# Patient Record
Sex: Female | Born: 1955 | Race: White | Hispanic: No | Marital: Married | Smoking: Never smoker
Health system: Southern US, Community
[De-identification: ages and names within clinical notes are randomized; demographics above are authoritative.]

## PROBLEM LIST (undated history)

## (undated) DIAGNOSIS — Z794 Long term (current) use of insulin: Secondary | ICD-10-CM

## (undated) DIAGNOSIS — M3500 Sicca syndrome, unspecified: Secondary | ICD-10-CM

## (undated) DIAGNOSIS — F419 Anxiety disorder, unspecified: Secondary | ICD-10-CM

## (undated) DIAGNOSIS — T7840XA Allergy, unspecified, initial encounter: Secondary | ICD-10-CM

## (undated) DIAGNOSIS — G629 Polyneuropathy, unspecified: Secondary | ICD-10-CM

## (undated) DIAGNOSIS — K219 Gastro-esophageal reflux disease without esophagitis: Secondary | ICD-10-CM

## (undated) DIAGNOSIS — H269 Unspecified cataract: Secondary | ICD-10-CM

## (undated) DIAGNOSIS — H8090 Unspecified otosclerosis, unspecified ear: Secondary | ICD-10-CM

## (undated) DIAGNOSIS — I10 Essential (primary) hypertension: Secondary | ICD-10-CM

## (undated) DIAGNOSIS — D692 Other nonthrombocytopenic purpura: Secondary | ICD-10-CM

## (undated) DIAGNOSIS — G709 Myoneural disorder, unspecified: Secondary | ICD-10-CM

## (undated) DIAGNOSIS — E7800 Pure hypercholesterolemia, unspecified: Secondary | ICD-10-CM

## (undated) DIAGNOSIS — D649 Anemia, unspecified: Secondary | ICD-10-CM

## (undated) DIAGNOSIS — L309 Dermatitis, unspecified: Secondary | ICD-10-CM

## (undated) DIAGNOSIS — F32A Depression, unspecified: Secondary | ICD-10-CM

## (undated) DIAGNOSIS — E119 Type 2 diabetes mellitus without complications: Secondary | ICD-10-CM

## (undated) DIAGNOSIS — R011 Cardiac murmur, unspecified: Secondary | ICD-10-CM

## (undated) DIAGNOSIS — M810 Age-related osteoporosis without current pathological fracture: Secondary | ICD-10-CM

## (undated) DIAGNOSIS — R9431 Abnormal electrocardiogram [ECG] [EKG]: Secondary | ICD-10-CM

## (undated) DIAGNOSIS — M069 Rheumatoid arthritis, unspecified: Secondary | ICD-10-CM

## (undated) DIAGNOSIS — G43909 Migraine, unspecified, not intractable, without status migrainosus: Secondary | ICD-10-CM

## (undated) DIAGNOSIS — Z9889 Other specified postprocedural states: Secondary | ICD-10-CM

## (undated) HISTORY — DX: Other nonthrombocytopenic purpura: D69.2

## (undated) HISTORY — DX: Age-related osteoporosis without current pathological fracture: M81.0

## (undated) HISTORY — DX: Other specified postprocedural states: Z98.890

## (undated) HISTORY — DX: Pure hypercholesterolemia, unspecified: E78.00

## (undated) HISTORY — DX: Polyneuropathy, unspecified: G62.9

## (undated) HISTORY — DX: Long term (current) use of insulin: Z79.4

## (undated) HISTORY — DX: Cardiac murmur, unspecified: R01.1

## (undated) HISTORY — DX: Depression, unspecified: F32.A

## (undated) HISTORY — DX: Allergy, unspecified, initial encounter: T78.40XA

## (undated) HISTORY — DX: Sjogren syndrome, unspecified: M35.00

## (undated) HISTORY — DX: Unspecified cataract: H26.9

## (undated) HISTORY — DX: Dermatitis, unspecified: L30.9

## (undated) HISTORY — DX: Migraine, unspecified, not intractable, without status migrainosus: G43.909

## (undated) HISTORY — PX: EYE SURGERY: SHX253

## (undated) HISTORY — PX: COLONOSCOPY: SHX174

## (undated) HISTORY — DX: Abnormal electrocardiogram (ECG) (EKG): R94.31

## (undated) HISTORY — DX: Rheumatoid arthritis, unspecified: M06.9

## (undated) HISTORY — DX: Gastro-esophageal reflux disease without esophagitis: K21.9

## (undated) HISTORY — DX: Anemia, unspecified: D64.9

## (undated) HISTORY — DX: Myoneural disorder, unspecified: G70.9

## (undated) HISTORY — DX: Unspecified otosclerosis, unspecified ear: H80.90

## (undated) HISTORY — DX: Type 2 diabetes mellitus without complications: E11.9

## (undated) HISTORY — DX: Anxiety disorder, unspecified: F41.9

---
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
## 1993-06-21 HISTORY — PX: BREAST LUMPECTOMY: SHX2

## 1998-01-16 ENCOUNTER — Other Ambulatory Visit: Admission: RE | Admit: 1998-01-16 | Discharge: 1998-01-16 | Payer: Self-pay | Admitting: Otolaryngology

## 1998-06-21 DIAGNOSIS — M069 Rheumatoid arthritis, unspecified: Secondary | ICD-10-CM

## 1998-06-21 HISTORY — PX: CARPAL TUNNEL RELEASE: SHX101

## 1998-06-21 HISTORY — DX: Rheumatoid arthritis, unspecified: M06.9

## 1998-06-21 HISTORY — PX: TOE SURGERY: SHX1073

## 1998-08-28 ENCOUNTER — Other Ambulatory Visit: Admission: RE | Admit: 1998-08-28 | Discharge: 1998-08-28 | Payer: Self-pay | Admitting: Obstetrics and Gynecology

## 1998-09-29 ENCOUNTER — Other Ambulatory Visit: Admission: RE | Admit: 1998-09-29 | Discharge: 1998-09-29 | Payer: Self-pay | Admitting: Radiology

## 1998-10-15 ENCOUNTER — Ambulatory Visit (HOSPITAL_COMMUNITY): Admission: RE | Admit: 1998-10-15 | Discharge: 1998-10-15 | Payer: Self-pay | Admitting: *Deleted

## 1998-12-10 ENCOUNTER — Encounter: Admission: RE | Admit: 1998-12-10 | Discharge: 1999-03-10 | Payer: Self-pay | Admitting: Endocrinology

## 1999-04-22 ENCOUNTER — Encounter: Payer: Self-pay | Admitting: Endocrinology

## 1999-04-22 ENCOUNTER — Ambulatory Visit (HOSPITAL_COMMUNITY): Admission: RE | Admit: 1999-04-22 | Discharge: 1999-04-22 | Payer: Self-pay | Admitting: Endocrinology

## 1999-10-12 ENCOUNTER — Other Ambulatory Visit: Admission: RE | Admit: 1999-10-12 | Discharge: 1999-10-12 | Payer: Self-pay | Admitting: Obstetrics and Gynecology

## 1999-10-24 ENCOUNTER — Emergency Department (HOSPITAL_COMMUNITY): Admission: EM | Admit: 1999-10-24 | Discharge: 1999-10-24 | Payer: Self-pay | Admitting: Internal Medicine

## 1999-10-24 ENCOUNTER — Encounter: Payer: Self-pay | Admitting: Internal Medicine

## 2000-04-05 ENCOUNTER — Other Ambulatory Visit: Admission: RE | Admit: 2000-04-05 | Discharge: 2000-04-05 | Payer: Self-pay | Admitting: Specialist

## 2000-06-03 ENCOUNTER — Other Ambulatory Visit: Admission: RE | Admit: 2000-06-03 | Discharge: 2000-06-03 | Payer: Self-pay | Admitting: Obstetrics and Gynecology

## 2000-06-03 ENCOUNTER — Encounter (INDEPENDENT_AMBULATORY_CARE_PROVIDER_SITE_OTHER): Payer: Self-pay | Admitting: Specialist

## 2000-06-21 HISTORY — PX: LYMPH NODE DISSECTION: SHX5087

## 2000-10-17 ENCOUNTER — Other Ambulatory Visit: Admission: RE | Admit: 2000-10-17 | Discharge: 2000-10-17 | Payer: Self-pay | Admitting: Obstetrics and Gynecology

## 2001-02-19 HISTORY — PX: HYSTEROSCOPY: SHX211

## 2001-03-17 ENCOUNTER — Ambulatory Visit (HOSPITAL_COMMUNITY): Admission: RE | Admit: 2001-03-17 | Discharge: 2001-03-17 | Payer: Self-pay | Admitting: Obstetrics and Gynecology

## 2001-03-17 ENCOUNTER — Encounter (INDEPENDENT_AMBULATORY_CARE_PROVIDER_SITE_OTHER): Payer: Self-pay | Admitting: *Deleted

## 2001-08-21 ENCOUNTER — Encounter: Admission: RE | Admit: 2001-08-21 | Discharge: 2001-08-21 | Payer: Self-pay | Admitting: Endocrinology

## 2001-08-21 ENCOUNTER — Encounter: Payer: Self-pay | Admitting: Endocrinology

## 2001-10-12 ENCOUNTER — Other Ambulatory Visit: Admission: RE | Admit: 2001-10-12 | Discharge: 2001-10-12 | Payer: Self-pay | Admitting: Obstetrics and Gynecology

## 2002-08-01 ENCOUNTER — Encounter: Admission: RE | Admit: 2002-08-01 | Discharge: 2002-10-30 | Payer: Self-pay | Admitting: Endocrinology

## 2002-10-17 ENCOUNTER — Encounter: Payer: Self-pay | Admitting: Cardiology

## 2002-10-30 ENCOUNTER — Other Ambulatory Visit: Admission: RE | Admit: 2002-10-30 | Discharge: 2002-10-30 | Payer: Self-pay | Admitting: Obstetrics and Gynecology

## 2004-02-28 ENCOUNTER — Other Ambulatory Visit: Admission: RE | Admit: 2004-02-28 | Discharge: 2004-02-28 | Payer: Self-pay | Admitting: *Deleted

## 2004-03-04 ENCOUNTER — Encounter: Payer: Self-pay | Admitting: Internal Medicine

## 2005-03-02 ENCOUNTER — Other Ambulatory Visit: Admission: RE | Admit: 2005-03-02 | Discharge: 2005-03-02 | Payer: Self-pay | Admitting: *Deleted

## 2006-03-03 ENCOUNTER — Other Ambulatory Visit: Admission: RE | Admit: 2006-03-03 | Discharge: 2006-03-03 | Payer: Self-pay | Admitting: Obstetrics & Gynecology

## 2006-03-22 ENCOUNTER — Encounter (HOSPITAL_COMMUNITY): Admission: RE | Admit: 2006-03-22 | Discharge: 2006-06-20 | Payer: Self-pay | Admitting: Rheumatology

## 2006-07-14 ENCOUNTER — Encounter (HOSPITAL_COMMUNITY): Admission: RE | Admit: 2006-07-14 | Discharge: 2006-10-12 | Payer: Self-pay | Admitting: Interventional Radiology

## 2006-10-13 ENCOUNTER — Encounter (HOSPITAL_COMMUNITY): Admission: RE | Admit: 2006-10-13 | Discharge: 2007-01-11 | Payer: Self-pay | Admitting: Interventional Radiology

## 2007-02-02 ENCOUNTER — Encounter (HOSPITAL_COMMUNITY): Admission: RE | Admit: 2007-02-02 | Discharge: 2007-05-03 | Payer: Self-pay | Admitting: Interventional Radiology

## 2007-05-15 ENCOUNTER — Other Ambulatory Visit: Admission: RE | Admit: 2007-05-15 | Discharge: 2007-05-15 | Payer: Self-pay | Admitting: Obstetrics and Gynecology

## 2007-06-01 ENCOUNTER — Encounter (HOSPITAL_COMMUNITY): Admission: RE | Admit: 2007-06-01 | Discharge: 2007-07-03 | Payer: Self-pay | Admitting: Interventional Radiology

## 2007-07-06 ENCOUNTER — Encounter (HOSPITAL_COMMUNITY): Admission: RE | Admit: 2007-07-06 | Discharge: 2007-10-04 | Payer: Self-pay | Admitting: Interventional Radiology

## 2007-07-10 LAB — HM DEXA SCAN

## 2007-08-14 ENCOUNTER — Encounter (INDEPENDENT_AMBULATORY_CARE_PROVIDER_SITE_OTHER): Payer: Self-pay | Admitting: Otolaryngology

## 2007-08-14 ENCOUNTER — Ambulatory Visit (HOSPITAL_BASED_OUTPATIENT_CLINIC_OR_DEPARTMENT_OTHER): Admission: RE | Admit: 2007-08-14 | Discharge: 2007-08-15 | Payer: Self-pay | Admitting: Otolaryngology

## 2007-10-05 ENCOUNTER — Encounter (HOSPITAL_COMMUNITY): Admission: RE | Admit: 2007-10-05 | Discharge: 2008-01-03 | Payer: Self-pay | Admitting: Rheumatology

## 2008-01-04 ENCOUNTER — Encounter (HOSPITAL_COMMUNITY): Admission: RE | Admit: 2008-01-04 | Discharge: 2008-04-03 | Payer: Self-pay | Admitting: Rheumatology

## 2008-04-25 ENCOUNTER — Encounter (HOSPITAL_COMMUNITY): Admission: RE | Admit: 2008-04-25 | Discharge: 2008-06-20 | Payer: Self-pay | Admitting: Rheumatology

## 2008-06-24 ENCOUNTER — Encounter: Admission: RE | Admit: 2008-06-24 | Discharge: 2008-09-22 | Payer: Self-pay | Admitting: Rheumatology

## 2008-08-09 ENCOUNTER — Other Ambulatory Visit: Admission: RE | Admit: 2008-08-09 | Discharge: 2008-08-09 | Payer: Self-pay | Admitting: Obstetrics and Gynecology

## 2008-10-10 ENCOUNTER — Encounter (HOSPITAL_COMMUNITY): Admission: RE | Admit: 2008-10-10 | Discharge: 2009-01-08 | Payer: Self-pay | Admitting: Rheumatology

## 2008-11-28 ENCOUNTER — Encounter: Payer: Self-pay | Admitting: *Deleted

## 2009-01-09 ENCOUNTER — Encounter (HOSPITAL_COMMUNITY): Admission: RE | Admit: 2009-01-09 | Discharge: 2009-04-09 | Payer: Self-pay | Admitting: Rheumatology

## 2009-04-11 ENCOUNTER — Encounter (HOSPITAL_COMMUNITY): Admission: RE | Admit: 2009-04-11 | Discharge: 2009-07-10 | Payer: Self-pay | Admitting: Rheumatology

## 2009-06-21 LAB — HM COLONOSCOPY

## 2009-07-24 ENCOUNTER — Encounter (HOSPITAL_COMMUNITY): Admission: RE | Admit: 2009-07-24 | Discharge: 2009-10-22 | Payer: Self-pay | Admitting: Rheumatology

## 2009-08-13 ENCOUNTER — Telehealth: Payer: Self-pay | Admitting: Internal Medicine

## 2009-08-15 ENCOUNTER — Encounter: Payer: Self-pay | Admitting: Internal Medicine

## 2009-08-15 ENCOUNTER — Ambulatory Visit: Payer: Self-pay | Admitting: Internal Medicine

## 2009-08-15 DIAGNOSIS — K5901 Slow transit constipation: Secondary | ICD-10-CM | POA: Insufficient documentation

## 2009-08-15 DIAGNOSIS — R143 Flatulence: Secondary | ICD-10-CM

## 2009-08-15 DIAGNOSIS — R142 Eructation: Secondary | ICD-10-CM

## 2009-08-15 DIAGNOSIS — K259 Gastric ulcer, unspecified as acute or chronic, without hemorrhage or perforation: Secondary | ICD-10-CM | POA: Insufficient documentation

## 2009-08-15 DIAGNOSIS — F411 Generalized anxiety disorder: Secondary | ICD-10-CM | POA: Insufficient documentation

## 2009-08-15 DIAGNOSIS — E119 Type 2 diabetes mellitus without complications: Secondary | ICD-10-CM | POA: Insufficient documentation

## 2009-08-15 DIAGNOSIS — R141 Gas pain: Secondary | ICD-10-CM | POA: Insufficient documentation

## 2009-08-15 DIAGNOSIS — K5900 Constipation, unspecified: Secondary | ICD-10-CM | POA: Insufficient documentation

## 2009-08-28 ENCOUNTER — Emergency Department (HOSPITAL_COMMUNITY): Admission: EM | Admit: 2009-08-28 | Discharge: 2009-08-28 | Payer: Self-pay | Admitting: Emergency Medicine

## 2009-08-29 ENCOUNTER — Telehealth: Payer: Self-pay | Admitting: Internal Medicine

## 2009-09-02 ENCOUNTER — Ambulatory Visit: Payer: Self-pay | Admitting: Gastroenterology

## 2009-09-02 DIAGNOSIS — R1319 Other dysphagia: Secondary | ICD-10-CM | POA: Insufficient documentation

## 2009-09-02 DIAGNOSIS — R079 Chest pain, unspecified: Secondary | ICD-10-CM | POA: Insufficient documentation

## 2009-10-01 ENCOUNTER — Ambulatory Visit: Payer: Self-pay | Admitting: Internal Medicine

## 2009-10-01 DIAGNOSIS — K219 Gastro-esophageal reflux disease without esophagitis: Secondary | ICD-10-CM | POA: Insufficient documentation

## 2009-10-01 LAB — CONVERTED CEMR LAB: UREASE: NEGATIVE

## 2009-10-05 ENCOUNTER — Encounter: Payer: Self-pay | Admitting: Internal Medicine

## 2009-10-13 ENCOUNTER — Ambulatory Visit (HOSPITAL_COMMUNITY): Admission: RE | Admit: 2009-10-13 | Discharge: 2009-10-13 | Payer: Self-pay | Admitting: Otolaryngology

## 2010-01-12 ENCOUNTER — Encounter (HOSPITAL_COMMUNITY): Admission: RE | Admit: 2010-01-12 | Discharge: 2010-04-12 | Payer: Self-pay | Admitting: Rheumatology

## 2010-04-21 ENCOUNTER — Encounter (HOSPITAL_COMMUNITY)
Admission: RE | Admit: 2010-04-21 | Discharge: 2010-07-20 | Payer: Self-pay | Source: Home / Self Care | Attending: Rheumatology | Admitting: Rheumatology

## 2010-07-21 NOTE — Assessment & Plan Note (Signed)
Summary: constipation   History of Present Illness Visit Type: Initial Consult Primary GI MD: Yancey Flemings MD Primary Provider: Adrian Prince, MD Requesting Provider: Valentina Shaggy, MD Chief Complaint: constipation History of Present Illness:   55 YO FEMALE KNOWN TO DR. PERRY FROM PREVIOUS COLONOSCOPY AND EGD IN 2005. COLONOSCOPY WAS NORMAL,EGD SHOWED A HEALED ULCER IN THE ANTRUM.SHE HAS HX OF IDDM X 41 YEARS AND IS USING AN INSULIN PUMP.SHE COMES IN NOW WITH C/O CHANGE IN BOWEL HABITS OVER THE PAST MONTH.SHE FEELS BLOATED,GASSY WHEN SHE EATS AND HAS BEEN HAVING INTERMITTENT ABDOMINAL DISTENTION. SHE IS GOING TO THE BATHROOM LESS FREQUENTLY ,FEELS SHE IS PASSING VERY LITTLE STOOL,AND BMS ARE VERY NARROWED. NO MELENA OR HEME.ON NEW MEDICINE IS MINCYCLINE FOR HER EYSES AND HAS BEEN ON THIS FOR MONTHS.SHE IS ON CHRONIC PREVACID FOR HER STOMACH, NO CURRENT UPPER GI C/O.   GI Review of Systems    Reports abdominal pain and  bloating.     Location of  Abdominal pain: lower abdomen.    Denies acid reflux, belching, chest pain, dysphagia with liquids, dysphagia with solids, heartburn, loss of appetite, nausea, vomiting, vomiting blood, and  weight loss.      Reports change in bowel habits and  constipation.     Denies anal fissure, black tarry stools, diarrhea, diverticulosis, fecal incontinence, heme positive stool, hemorrhoids, irritable bowel syndrome, jaundice, light color stool, liver problems, rectal bleeding, and  rectal pain. Preventive Screening-Counseling & Management  Alcohol-Tobacco     Smoking Status: never      Drug Use:  no.      Current Medications (verified): 1)  Novolog 100 Unit/ml Soln (Insulin Aspart) Pump .... As Directed 2)  Orencia 250 Mg Solr (Abatacept) .... Once Monthly 3)  Cymbalta 60 Mg Cpep (Duloxetine Hcl) .Marland Kitchen.. 1 Capsule By Mouth Once Daily 4)  Folic Acid 1 Mg Tabs (Folic Acid) .Marland Kitchen.. 1 Tablet By Mouth Once Daily 5)  Fosinopril Sodium 10 Mg Tabs (Fosinopril Sodium)  .Marland Kitchen.. 1 Tablet By Mouth Once Daily 6)  Arava 20 Mg Tabs (Leflunomide) .Marland Kitchen.. 1 Tablet By Mouth Once Daily 7)  Restasis 0.05 % Emul (Cyclosporine) .... 2 Drops Once Daily 8)  Topamax 200 Mg Tabs (Topiramate) .Marland Kitchen.. 1 Tablet By Mouth Once Daily 9)  Cyclobenzaprine Hcl 10 Mg Tabs (Cyclobenzaprine Hcl) .Marland Kitchen.. 1 Tablet By Mouth Once Daily 10)  Imitrex 50 Mg Tabs (Sumatriptan Succinate) .... Take As Needed 11)  Metoclopramide Hcl 10 Mg Tabs (Metoclopramide Hcl) .... As Needed 12)  Cvs Vitamin B-6 100 Mg Tabs (Pyridoxine Hcl) .Marland Kitchen.. 1 Tablet By Mouth Once Daily 13)  Vitamin E 1000 Unit Caps (Vitamin E) .Marland Kitchen.. 1 Capsule By Mouth Once Daily 14)  Vitamin D3 400 Unit Tabs (Cholecalciferol) .Marland Kitchen.. 1 Table Tpo Once Daily 15)  Aspirin 81 Mg Tbec (Aspirin) .Marland Kitchen.. 1 Tablet By Mouth Once Daily 16)  Vitamin C Cr 1000 Mg Cr-Tabs (Ascorbic Acid) .Marland Kitchen.. 1 Tablet By Mouth Once Daily 17)  Miralax  Powd (Polyethylene Glycol 3350) .... Take As Directed 18)  Hydrochlorothiazide 50 Mg Tabs (Hydrochlorothiazide) .Marland Kitchen.. 1 Tablet By Mouth Once Daily  Allergies (verified): 1)  ! Pcn 2)  ! Cipro  Past History:  Past Medical History: Anxiety Disorder IDDM X 41 YEARS-INSULIN PUMP Arthritis Chronic Headaches  Peptic Ulcer Disease 200  Past Surgical History: C-Section Lumpectomy x 6  Foot Surgery  Family History: Reviewed history and no changes required. Family History of Breast Cancer:mother, MGM, 3 Great Aunts, GMGM Family History of Colon Polyps:Mother, MGM,  Uncles Family History of Diabetes: MGF, PGF Family History of Irritable Bowel Syndrome:IBS No FH of Colon Cancer:  Social History: Reviewed history and no changes required. Married 1 girl Unemployed Patient has never smoked.  Alcohol Use - yes Daily Caffeine Use Illicit Drug Use - no Smoking Status:  never Drug Use:  no  Review of Systems  The patient denies allergy/sinus, anemia, anxiety-new, arthritis/joint pain, back pain, blood in urine, breast  changes/lumps, change in vision, confusion, cough, coughing up blood, depression-new, fainting, fatigue, fever, headaches-new, hearing problems, heart murmur, heart rhythm changes, itching, menstrual pain, muscle pains/cramps, night sweats, nosebleeds, pregnancy symptoms, shortness of breath, skin rash, sleeping problems, sore throat, swelling of feet/legs, swollen lymph glands, thirst - excessive , urination - excessive , urination changes/pain, urine leakage, vision changes, and voice change.         ROS OTHERWISE AS IN HPI  Vital Signs:  Patient profile:   55 year old female Height:      64 inches Weight:      126.25 pounds BMI:     21.75 Pulse rate:   84 / minute Pulse rhythm:   regular BP sitting:   108 / 58  (left arm)  Vitals Entered By: Milford Cage NCMA (August 15, 2009 8:32 AM)  Physical Exam  General:  Well developed, well nourished, no acute distress. Head:  Normocephalic and atraumatic. Eyes:  PERRLA, no icterus. Neck:  Supple; no masses or thyromegaly. Lungs:  Clear throughout to auscultation. Heart:  Regular rate and rhythm; no murmurs, rubs,  or bruits. Abdomen:  SOFT, NONTENDER, NO MASS OR HSM,BS+ Rectal:  NOT DONE Extremities:  No clubbing, cyanosis, edema or deformities noted. Neurologic:  Alert and  oriented x4;  grossly normal neurologically. Psych:  Alert and cooperative. Normal mood and affect.   Impression & Recommendations:  Problem # 1:  CONSTIPATION (ICD-564.00) 55 YO FEMALE LONG TERM IDDM,WITH CHANGE IN BOWEL HABITS WITH NARROW STOOLS AND CONSTIPATION  X ONE MONTH.R/O FUNCTIONAL CONSTIPATION,DIABETIC VISCERAL NEUROPATHY,R/O OCCULT LESION  SCHEDULE FOR COLONOSCOPY WITH DR. PERRY,PROCEDURE DISCUSSED IN DETAIL WITH PATIENT INCREASE  MIRALAX 17 GM IN 8 OZ WATER  TWICE DAILY ADD ALIGN ONE DAILY  Problem # 2:  DIABETES MELLITUS-TYPE II (ICD-250.00) Assessment: Comment Only INSULIN PUMP  Problem # 3:  ULCER-GASTRIC (ICD-531.9) Assessment: Comment  Only 2005,ON CHRONIC PPI  Problem # 4:  ANXIETY (ICD-300.00) Assessment: Comment Only  Other Orders: Colonoscopy (Colon)  Patient Instructions: 1)  Colonoscopy scheduled with Dr Marina Goodell for 10-01-09. 2)  Brochure and instructions provided, 3)  [facility] Patient Information Guide given to patient. 4)  Take 2 doses daily of the generic Miralax. Perscription sent to pharamcy. 5)  Copy sent to :  Adrian Prince, MD 6)                          Valentina Shaggy, MD 7)  Since you are not eating food the day before Colonoscopy 09-30-09, adjust the insulin pump as needed.  Prescriptions: MOVIPREP 100 GM  SOLR (PEG-KCL-NACL-NASULF-NA ASC-C) As per prep instructions.  #1 x 0   Entered by:   Lowry Ram NCMA   Authorized by:   Sammuel Cooper PA-c   Signed by:   Lowry Ram NCMA on 08/15/2009   Method used:   Electronically to        [facility] [address] (retail)       [address]  [place], [place]  [address]       Ph: [phone]       Fax: [phone]   RxID:   [id number] POLYETHYLENE GLYCOL 3350  POWD (POLYETHYLENE GLYCOL 3350) Take 17 grams in water or clear juice twice daily.  #3350 gm x 1   Entered by:   Lowry Ram NCMA   Authorized by:   Sammuel Cooper PA-c   Signed by:   Lowry Ram NCMA on 08/15/2009   Method used:   Electronically to        [facility] [address] (retail)       [address]       [place], [place]  [address]       Ph: [phone]       Fax: [phone]   RxID:   [id number]

## 2010-07-21 NOTE — Procedures (Signed)
Summary: [facility] EGD   EGD  Procedure date:  03/04/2004  Findings:      Location: [facility]   Patient Name: Becky, [name] MRN:  Procedure Procedures: Panendoscopy (EGD) CPT: 43235.  Personnel: Endoscopist: Wilhemina Bonito. Marina Goodell, MD.  Referred By: Adrian Prince, MD.  Exam Location: Exam performed in Outpatient Clinic. Outpatient  Patient Consent: Procedure, Alternatives, Risks and Benefits discussed, consent obtained, from patient. Consent was obtained by the RN.  Indications  Evaluation of: Anemia,  Microcytic.  Surveillance of: Prior gastric ulcer.  History  Current Medications: Patient is not currently taking Coumadin.  Pre-Exam Physical: Performed Mar 04, 2004  Entire physical exam was normal.  Exam Exam Info: Maximum depth of insertion Duodenum, intended Duodenum. Patient position: on left side. Images taken. ASA Classification: III. Tolerance: excellent.  Sedation Meds: Patient assessed and found to be appropriate for moderate (conscious) sedation. Residual sedation present from prior procedure today. Fentanyl 25 mcg. given IV. Versed 2 mg. given IV.  Monitoring: BP and pulse monitoring done. Oximetry used. Supplemental O2 given  Findings Normal: Proximal Esophagus to Duodenal 2nd Portion.  HEALED ULCER: in Antrum   Assessment Abnormal examination, see findings above.  Comments: HEALING ULCER Events  Unplanned Intervention: No unplanned interventions were required.  Unplanned Events: There were no complications. Plans Comments: 1.COADMINISTER PRILOSEC OTC 20MG  DAILY IF USING ASA OR ANY NSAID 2. ORAL IRON Disposition: After procedure patient sent to recovery. After recovery patient sent home.  Scheduling: Follow-up prn.  Comments: RETURN TO THE CARE OF DR. Evlyn Kanner  cc: Adrian Prince, MD     Samuel Germany, MD     This report was created from the original endoscopy report, which was reviewed and signed by the above listed  endoscopist.

## 2010-07-21 NOTE — Procedures (Signed)
Summary: Colonoscopy  Patient: Hailei Besser Note: All result statuses are Final unless otherwise noted.  Tests: (1) Colonoscopy (COL)   COL Colonoscopy           DONE     [facility]     [address]     [place], [place]  [address]           COLONOSCOPY PROCEDURE REPORT           PATIENT:  Rossy, Virag  MR#:  [id number]     BIRTHDATE:  10-Jan-1956, 54 yrs. old  GENDER:  female     ENDOSCOPIST:  Wilhemina Bonito. Eda Keys, MD     REF. BY:  Office     PROCEDURE DATE:  10/01/2009     PROCEDURE:  Colonoscopy with snare polypectomy X 1     ASA CLASS:  Class II     INDICATIONS:  change in bowel habits     MEDICATIONS:   Fentanyl 100 mcg IV, Versed 10 mg IV           DESCRIPTION OF PROCEDURE:   After the risks benefits and     alternatives of the procedure were thoroughly explained, informed     consent was obtained.  Digital rectal exam was performed and     revealed no abnormalities.   The LB CF-[id number] endoscope     was introduced through the anus and advanced to the cecum, which     was identified by both the appendix and ileocecal valve, without     limitations. Time to cecum = 8:01 min. The quality of the prep was     adequate, using MoviPrep.  The instrument was then slowly     withdrawn (13:74min) as the colon was fully examined.     <<PROCEDUREIMAGES>>           FINDINGS:  Tortuous colon. Fair / adequate prep with some     limitations.A diminutive polyp was found in the mid transverse     colon. Polyp was snared without cautery. Retrieval was successful.     This was otherwise a normal examination of the colon.     Retroflexed views in the rectum revealed no abnormalities.    The     scope was then withdrawn from the patient and the procedure     completed.           COMPLICATIONS:  None     ENDOSCOPIC IMPRESSION:     1) Diminutive polyp in the mid transverse colon - removed     2) Fair / adequate prep     3) Otherwise normal examination        RECOMMENDATIONS:     1) Follow up colonoscopy in 5 years (regardless of path) - more     vigorous prep           ______________________________     Wilhemina Bonito. Eda Keys, MD           CC:  Adrian Prince, MD ; The Patient           n.     eSIGNED:   Wilhemina Bonito. Eda Keys at 10/01/2009 03:31 PM           [place], [phone]  Note: An exclamation mark (!) indicates a result that was not dispersed into the flowsheet. Document Creation Date: 10/01/2009 3:31 PM _______________________________________________________________________  (1) Order result status: Final Collection or observation date-time: 10/01/2009 15:23 Requested date-time:  Receipt date-time:  Reported date-time:  Referring Physician:   Ordering Physician: Fransico Setters [id number]) Specimen Source:  Source: Launa Grill Order Number: [id number] Lab site:   Appended Document: Colonoscopy     Procedures Next Due Date:    Colonoscopy: 09/2014

## 2010-07-21 NOTE — Procedures (Signed)
Summary: [name]   Colonoscopy  Procedure date:  03/04/2004  Findings:      Location:  [facility].   Patient Name: Becky [name], Becky [name] MRN:  Procedure Procedures: Colonoscopy CPT: 78295.  Personnel: Endoscopist: Wilhemina Bonito. Marina Goodell, MD.  Referred By: Adrian Prince, MD.  Exam Location: Exam performed in Outpatient Clinic. Outpatient  Patient Consent: Procedure, Alternatives, Risks and Benefits discussed, consent obtained, from patient. Consent was obtained by the RN.  Indications  Evaluation of: Anemia Microcytic.  Average Risk Screening Routine.  History  Current Medications: Patient is not currently taking Coumadin.  Pre-Exam Physical: Performed Mar 04, 2004. Entire physical exam was normal.  Exam Exam: Extent of exam reached: Terminal Ileum, extent intended: Terminal Ileum.  The cecum was identified by appendiceal orifice and IC valve. Patient position: on left side. Colon retroflexion performed. Images taken. ASA Classification: III. Tolerance: excellent.  Monitoring: Pulse and BP monitoring, Oximetry used. Supplemental O2 given.  Colon Prep Used Miralax for colon prep. Prep results: fair, adequate exam.  Sedation Meds: Patient assessed and found to be appropriate for moderate (conscious) sedation. Fentanyl 75 mcg. given IV. Versed 8 mg. given IV.  Findings NORMAL EXAM: Cecum to Rectum. Comments: small cecal lipoma seen.  NORMAL EXAM: Ileum.    Comments: NO POLYPS OR AVM'S SEEN Assessment Normal examination.  Events  Unplanned Interventions: No intervention was required.  Unplanned Events: There were no complications. Plans Disposition: After procedure patient sent to recovery. After recovery patient sent home.  Scheduling/Referral: Colonoscopy, to Wilhemina Bonito. Marina Goodell, MD, at age 55 for screening,   Comments: Return to the care of Dr. Evlyn Kanner  cc: Adrian Prince, MD     Pollyann Savoy,  MD  This report was created from the original  endoscopy report, which was reviewed and signed by the above listed endoscopist.

## 2010-07-21 NOTE — Letter (Signed)
Summary: Patient Notice- Polyp Results  [facility]  [address] [place], [place] [address]   Phone: [phone]  Fax: [phone]        October 05, 2009 MRN: [id number]    Becky [name] [address] [place], [place]  [address]    Dear Ms. Hansen,  I am pleased to inform you that the colon polyp(s) removed during your recent colonoscopy was (were) found to be benign (no cancer detected) upon pathologic examination.  I recommend you have a repeat colonoscopy examination in 5 years to look for recurrent polyps, as having colon polyps increases your risk for having recurrent polyps or even colon cancer in the future.  Should you develop new or worsening symptoms of abdominal pain, bowel habit changes or bleeding from the rectum or bowels, please schedule an evaluation with either your primary care physician or with me.  Additional information/recommendations:  __ No further action with gastroenterology is needed at this time. Please      follow-up with your primary care physician for your other healthcare      needs.  Please call us if you are having persistent problems or have questions about your condition that have not been fully answered at this time.  Sincerely,  Hilarie Fredrickson MD  This letter has been electronically signed by your physician.  Appended Document: Patient Notice- Polyp Results letter mailed 4.18.11

## 2010-07-21 NOTE — Assessment & Plan Note (Signed)
Summary: Post er visit f/Dr.Perry pt.   History of Present Illness Visit Type: Follow-up Visit Primary GI MD: Yancey Flemings MD Primary Provider: Adrian Prince, MD Requesting Provider: Valentina Shaggy, MD Chief Complaint: f/u visit ER visit food getting stuck couldn't swallow with some chest pressure History of Present Illness:   Here for evaluation of chest pain, she was seen here a few weeks ago for constipation.  First episode of chest pain was three weeks ago. Recently had second episode, went to ER. Both episodes after eating. Felt weird feeling in chest, defined as pressure. Couldnn't swallow saliva. Felt like her food was sitting in her throat. Was having a lot of gurgling in throat / chest. Symptoms started to resolve after 30 minutes. She has occasional heartburn. No prior history of dysphagia.    She has been on longterm Minocycline for eye inflammation. Since being on the antibiotics she has had abdominal bloating. Opthamologist stopped the antibiotcs yesterday.   Chronic constipation, recently doubled daily dose of Miralax and doing better. Last colonoscopy 1995. She is scheduled for one on April 13th.    Remote history of PUD (1995). On Prevacid for years.    GI Review of Systems    Reports belching and  bloating.      Denies abdominal pain, acid reflux, chest pain, dysphagia with liquids, dysphagia with solids, heartburn, loss of appetite, nausea, vomiting, vomiting blood, weight loss, and  weight gain.      Reports constipation.     Denies anal fissure, black tarry stools, change in bowel habit, diarrhea, diverticulosis, fecal incontinence, heme positive stool, hemorrhoids, irritable bowel syndrome, jaundice, light color stool, liver problems, rectal bleeding, and  rectal pain.    Current Medications (verified): 1)  Novolog 100 Unit/ml Soln (Insulin Aspart) Pump .... As Directed 2)  Orencia 250 Mg Solr (Abatacept) .... Once Monthly 3)  Cymbalta 60 Mg Cpep (Duloxetine Hcl) .Marland Kitchen.. 1  Capsule By Mouth Once Daily 4)  Folic Acid 1 Mg Tabs (Folic Acid) .Marland Kitchen.. 1 Tablet By Mouth Once Daily 5)  Fosinopril Sodium 10 Mg Tabs (Fosinopril Sodium) .Marland Kitchen.. 1 Tablet By Mouth Once Daily 6)  Arava 20 Mg Tabs (Leflunomide) .Marland Kitchen.. 1 Tablet By Mouth Once Daily 7)  Restasis 0.05 % Emul (Cyclosporine) .... 2 Drops Once Daily 8)  Topamax 200 Mg Tabs (Topiramate) .Marland Kitchen.. 1 Tablet By Mouth Once Daily 9)  Cyclobenzaprine Hcl 10 Mg Tabs (Cyclobenzaprine Hcl) .Marland Kitchen.. 1 Tablet By Mouth Once Daily 10)  Imitrex 50 Mg Tabs (Sumatriptan Succinate) .... Take As Needed 11)  Metoclopramide Hcl 10 Mg Tabs (Metoclopramide Hcl) .... As Needed 12)  Cvs Vitamin B-6 100 Mg Tabs (Pyridoxine Hcl) .Marland Kitchen.. 1 Tablet By Mouth Once Daily 13)  Vitamin E 1000 Unit Caps (Vitamin E) .Marland Kitchen.. 1 Capsule By Mouth Once Daily 14)  Vitamin D3 400 Unit Tabs (Cholecalciferol) .Marland Kitchen.. 1 Table Tpo Once Daily 15)  Aspirin 81 Mg Tbec (Aspirin) .Marland Kitchen.. 1 Tablet By Mouth Once Daily 16)  Vitamin C Cr 1000 Mg Cr-Tabs (Ascorbic Acid) .Marland Kitchen.. 1 Tablet By Mouth Once Daily 17)  Miralax  Powd (Polyethylene Glycol 3350) .... Take As Directed 18)  Hydrochlorothiazide 50 Mg Tabs (Hydrochlorothiazide) .Marland Kitchen.. 1 Tablet By Mouth Once Daily  Allergies (verified): 1)  ! Pcn 2)  ! Cipro  Past History:  Past Medical History: Anxiety Disorder IDDM X 41 YEARS-INSULIN PUMP Rheumatoid Arthritis Chronic Headaches Peptic Ulcer Disease  Past Surgical History: Reviewed history from 08/15/2009 and no changes required. C-Section Lumpectomy x 6  Foot  Surgery  Family History: Reviewed history from 08/15/2009 and no changes required. Family History of Breast Cancer:mother, MGM, 3 Great Aunts, GMGM Family History of Colon Polyps:Mother, MGM, Uncles Family History of Diabetes: MGF, PGF Family History of Irritable Bowel Syndrome:IBS No FH of Colon Cancer:  Social History: Reviewed history from 08/15/2009 and no changes required. Married 1 girl Unemployed Patient has never  smoked.  Alcohol Use - yes Daily Caffeine Use Illicit Drug Use - no  Review of Systems       The patient complains of sleeping problems.  The patient denies allergy/sinus, anemia, anxiety-new, arthritis/joint pain, back pain, blood in urine, breast changes/lumps, change in vision, confusion, cough, coughing up blood, depression-new, fainting, fatigue, fever, headaches-new, hearing problems, heart murmur, heart rhythm changes, itching, muscle pains/cramps, night sweats, nosebleeds, shortness of breath, skin rash, sore throat, swelling of feet/legs, swollen lymph glands, thirst - excessive, urination - excessive, urination changes/pain, urine leakage, vision changes, and voice change.    Vital Signs:  Patient profile:   55 year old female Height:      64 inches Weight:      127 pounds BMI:     21.88 Pulse rate:   96 / minute Pulse rhythm:   regular BP sitting:   110 / 56  (left arm)  Vitals Entered By: Milford Cage NCMA (September 02, 2009 8:55 AM)  Physical Exam  General:  Well developed, well nourished, no acute distress. Heart:  Regular rate and rhythm; no murmurs, rubs,  or bruits. Abdomen:  Abdomen soft, nontender, nondistended. No obvious masses or hepatomegaly.Normal bowel sounds.  Extremities:  Mild right pedal edema. Neurologic:  Alert and  oriented x4;  grossly normal neurologically. Psych:  Alert and cooperative. Normal mood and affect.   Impression & Recommendations:  Problem # 1:  CHEST PAIN (ICD-786.50) Assessment New Two recent episodes of postprandial chest pressure, feeling of food sitting in her throat, inability to swallow saliva. Went to ER, per ER report there was no concern of coronary event. She may have stricture versus dysmotility. Has  been on Minocycline which can cause esophageal ulcers but doubt this is the case since she isn't having any odynophagia.  Patient is for CRC screening on 09/01/09.  Will add on EGD with biopsies/ esophageal dilation ( if  indicated).  The risks and benefits of the procedure, as well as alternatives were discussed with the patient and she agrees to proceed. Until procedure eats small pieces of food, chew well, push fluid with meals.    Patient Instructions: 1)  We have added the Endoscopy to the Colonoscopy, same date 10-01-09 but the time was changed to 2:30. You will need to arrive on our 4th floor at 1:30 PM.  2)  Between now and then, chew your food well , take small bites and push plenty of fluids. 3)  Copy sent to : Adrian Prince, MD 4)                         Valentina Shaggy, MD 5)  The medication list was reviewed and reconciled.  All changed / newly prescribed medications were explained.  A complete medication list was provided to the patient / caregiver.

## 2010-07-21 NOTE — Progress Notes (Signed)
Summary: Needs appoinment after ER visit yesterday  Phone Note Call from Patient Call back at Home Phone [phone] Call back at [phone] cell   Call For: Dr Marina Goodell Summary of Call: Micah Flesher to ER yesterday and was told she needed to be seen-has something in her chest. Initial call taken by: Leanor Kail [facility],  August 29, 2009 10:34 AM  Follow-up for Phone Call        Given appt. with PA for 09/02/2009. Follow-up by: Teryl Lucy RN,  August 29, 2009 12:02 PM

## 2010-07-21 NOTE — Progress Notes (Signed)
Summary: triage / constipation  Phone Note From Other Clinic Call back at [phone], ask for Heather   Caller: Herbert Seta, nurse Call For: Dr. Marina Goodell Reason for Call: Schedule Patient Appt Summary of Call: Shirlyn Goltz, NP would like pt seen earlier than next available (April) for constipation Initial call taken by: Vallarie Mare,  August 13, 2009 9:45 AM  Follow-up for Phone Call        Scheduled with PA for this Friday.Last colon by Dr.Anderson Coppock was 03/04/04. Instructed to bring co-pay and insurance cards and informed of cx. policy. Follow-up by: Teryl Lucy RN,  August 13, 2009 10:08 AM

## 2010-07-21 NOTE — Procedures (Signed)
Summary: Upper Endoscopy  [name] Note: All result statuses are Final unless otherwise noted.  Tests: (1) Upper Endoscopy (EGD)   EGD Upper Endoscopy       DONE     [facility]     [address]     [place], [place]  [address]           ENDOSCOPY PROCEDURE REPORT           PATIENT:  Becky [name], Becky [name]  MR#:  [id number]     BIRTHDATE:  11-Sep-1955, 54 yrs. old  GENDER:  female           ENDOSCOPIST:  Wilhemina Bonito. Eda Keys, MD     Referred by:  Office           PROCEDURE DATE:  10/01/2009     PROCEDURE:  EGD with biopsy     ASA CLASS:  Class II     INDICATIONS:  vomiting, chest pain           MEDICATIONS:   There was residual sedation effect present from     prior procedure., Versed 3 mg IV     TOPICAL ANESTHETIC:  Exactacain Spray           DESCRIPTION OF PROCEDURE:   After the risks benefits and     alternatives of the procedure were thoroughly explained, informed     consent was obtained.  The LB GIF-H180 [id number] endoscope was     introduced through the mouth and advanced to the second portion of     the duodenum, without limitations.  The instrument was slowly     withdrawn as the mucosa was fully examined.     <<PROCEDUREIMAGES>>           The upper, middle, and distal third of the esophagus were     carefully inspected and no abnormalities were noted. The z-line     was well seen at the GEJ. The endoscope was pushed into the fundus     which was normal including a retroflexed view. The antrum and     gastric body had somewhat atrophic scaley mucosa. Clo bx taken.     The first and second part of the duodenum were unremarkable.     Retroflexed views revealed no abnormalities.    The scope was then     withdrawn from the patient and the procedure completed.           COMPLICATIONS:  None           ENDOSCOPIC IMPRESSION:     1) Atrophic gastric mucosa     2) Normal EGD otherwise     3) Gerd           RECOMMENDATIONS:     1) continue Prevacid   2) Rx CLO if positive           ______________________________     Wilhemina Bonito. Eda Keys, MD           CC:  Adrian Prince, MD, The Patient           n.     eSIGNED:   Wilhemina Bonito. Eda Keys at 10/01/2009 03:40 PM           [place] Silos, [id number]  Note: An exclamation mark (!) indicates a result that was not dispersed into the flowsheet. Document Creation Date: 10/01/2009 3:41 PM _______________________________________________________________________  (1) Order result status: Final Collection or observation date-time: 10/01/2009 15:35  Requested date-time:  Receipt date-time:  Reported date-time:  Referring Physician:   Ordering Physician: Fransico Setters [phone]) Specimen Source:  Source: Launa Grill Order Number: [id number] Lab site:

## 2010-07-21 NOTE — Miscellaneous (Signed)
Summary: Clotest  Clinical Lists Changes  Problems: Added new problem of GERD (ICD-530.81) Orders: Added new Test order of TLB-H Pylori Screen Gastric Biopsy (83013-CLOTEST) - Signed 

## 2010-07-21 NOTE — Letter (Signed)
Summary: Diabetic Instructions  [facility]  [address] [place], [place] [address]   Phone: [phone]  Fax: [phone]    Becky [name] [birth year] MRN: [id number]   _  _   ORAL DIABETIC MEDICATION INSTRUCTIONS  The day before your procedure:   Take your diabetic pill as you do normally  The day of your procedure:   Do not take your diabetic pill    We will check your blood sugar levels during the admission process and again in Recovery before discharging you home  ________________________________________________________________________  _  _   INSULIN (LONG ACTING) MEDICATION INSTRUCTIONS (Lantus, NPH, 70/30, Humulin, Novolin-N)   The day before your procedure:   Take  your regular evening dose    The day of your procedure:   Do not take your morning dose    _  _   INSULIN (SHORT ACTING) MEDICATION INSTRUCTIONS (Regular, Humulog, Novolog)   The day before your procedure:   Do not take your evening dose   The day of your procedure:   Do not take your morning dose   X   INSULIN PUMP MEDICATION INSTRUCTIONS  We will contact the physician managing your diabetic care for written dosage instructions for the day before your procedure and the day of your procedure.  Once we have received the instructions, we will contact you.

## 2010-07-21 NOTE — Letter (Signed)
Summary: [facility] Instructions  [facility]  [address] [place], [place] [address]   Phone: [phone]  Fax: [phone]       Becky [name]    1956-01-27    MRN: [id number]        Procedure Day /Date:10-01-09     Arrival Time: 9:30 AM     Procedure Time:10:30 AM     Location of Procedure:                    X      Endoscopy Center (4th Floor)                       PREPARATION FOR COLONOSCOPY WITH MOVIPREP   Starting 5 days prior to your procedure 09-26-09 do not eat nuts, seeds, popcorn, corn, beans, peas,  salads, or any raw vegetables.  Do not take any fiber supplements (e.g. Metamucil, Citrucel, and Benefiber).  THE DAY BEFORE YOUR PROCEDURE         DATE: 09-30-09  DAY: Tuesday  1.  Drink clear liquids the entire day-NO SOLID FOOD  2.  Do not drink anything colored red or purple.  Avoid juices with pulp.  No orange juice.  3.  Drink at least 64 oz. (8 glasses) of fluid/clear liquids during the day to prevent dehydration and help the prep work efficiently.  CLEAR LIQUIDS INCLUDE: Water Jello Ice Popsicles Tea (sugar ok, no milk/cream) Powdered fruit flavored drinks Coffee (sugar ok, no milk/cream) Gatorade Juice: apple, white grape, white cranberry  Lemonade Clear bullion, consomm, broth Carbonated beverages (any kind) Strained chicken noodle soup Hard Candy                             4.  In the morning, mix first dose of MoviPrep solution:    Empty 1 Pouch A and 1 Pouch B into the disposable container    Add lukewarm drinking water to the top line of the container. Mix to dissolve    Refrigerate (mixed solution should be used within 24 hrs)  5.  Begin drinking the prep at 5:00 p.m. The MoviPrep container is divided by 4 marks.   Every 15 minutes drink the solution down to the next mark (approximately 8 oz) until the full liter is complete.   6.  Follow completed prep with 16 oz of clear liquid of your choice (Nothing red or  purple).  Continue to drink clear liquids until bedtime.  7.  Before going to bed, mix second dose of MoviPrep solution:    Empty 1 Pouch A and 1 Pouch B into the disposable container    Add lukewarm drinking water to the top line of the container. Mix to dissolve    Refrigerate  THE DAY OF YOUR PROCEDURE      DATE: 10-01-09 DAY: Wednesday  Beginning a t5:30 AM  (5 hours before procedure):         1. Every 15 minutes, drink the solution down to the next mark (approx 8 oz) until the full liter is complete.  2. Follow completed prep with 16 oz. of clear liquid of your choice.    3. You may drink clear liquids until 8:30 AM  (2 HOURS BEFORE PROCEDURE).   MEDICATION INSTRUCTIONS  Unless otherwise instructed, you should take regular prescription medications with a small sip of water   as early as possible the morning of  your procedure.  Diabetic patients - see separate instructions.       OTHER INSTRUCTIONS  You will need a responsible adult at least 54 years of age to accompany you and drive you home.   This person must remain in the waiting room during your procedure.  Wear loose fitting clothing that is easily removed.  Leave jewelry and other valuables at home.  However, you may wish to bring a book to read or  an iPod/MP3 player to listen to music as you wait for your procedure to start.  Remove all body piercing jewelry and leave at home.  Total time from sign-in until discharge is approximately 2-3 hours.  You should go home directly after your procedure and rest.  You can resume normal activities the  day after your procedure.  The day of your procedure you should not:   Drive   Make legal decisions   Operate machinery   Drink alcohol   Return to work  You will receive specific instructions about eating, activities and medications before you leave.    The above instructions have been reviewed and explained to me by   _______________________    I  fully understand and can verbalize these instructions _____________________________ Date _________

## 2010-07-22 ENCOUNTER — Ambulatory Visit (HOSPITAL_COMMUNITY)
Admission: RE | Admit: 2010-07-22 | Discharge: 2010-07-22 | Disposition: A | Discharge status: Home / Self Care | Payer: Medicare Other | Source: Ambulatory Visit | Attending: Interventional Radiology | Admitting: Interventional Radiology

## 2010-07-22 DIAGNOSIS — M069 Rheumatoid arthritis, unspecified: Secondary | ICD-10-CM | POA: Insufficient documentation

## 2010-08-19 ENCOUNTER — Encounter (HOSPITAL_COMMUNITY): Payer: Medicare Other | Attending: Interventional Radiology

## 2010-08-19 DIAGNOSIS — M069 Rheumatoid arthritis, unspecified: Secondary | ICD-10-CM | POA: Insufficient documentation

## 2010-09-08 LAB — CBC
HCT: 42 % (ref 36.0–46.0)
Hemoglobin: 14.4 g/dL (ref 12.0–15.0)
MCHC: 34.2 g/dL (ref 30.0–36.0)
MCV: 89.1 fL (ref 78.0–100.0)
Platelets: 261 10*3/uL (ref 150–400)
RBC: 4.72 MIL/uL (ref 3.87–5.11)
RDW: 14.2 % (ref 11.5–15.5)
WBC: 5 10*3/uL (ref 4.0–10.5)

## 2010-09-08 LAB — BASIC METABOLIC PANEL
BUN: 9 mg/dL (ref 6–23)
CO2: 28 mEq/L (ref 19–32)
Calcium: 9.3 mg/dL (ref 8.4–10.5)
Chloride: 104 mEq/L (ref 96–112)
Creatinine, Ser: 0.73 mg/dL (ref 0.4–1.2)
GFR calc Af Amer: >60 mL/min (ref >60)
GFR calc non Af Amer: >60 mL/min (ref >60)
Glucose, Bld: 206 mg/dL — ABNORMAL HIGH (ref 70–99)
Potassium: 4 mEq/L (ref 3.5–5.1)
Sodium: 137 mEq/L (ref 135–145)

## 2010-09-08 LAB — GLUCOSE, CAPILLARY
Glucose-Capillary: 131 mg/dL — ABNORMAL HIGH (ref 70–99)
Glucose-Capillary: 83 mg/dL (ref 70–99)

## 2010-09-09 LAB — GLUCOSE, CAPILLARY
Glucose-Capillary: 108 mg/dL — ABNORMAL HIGH (ref 70–99)
Glucose-Capillary: 76 mg/dL (ref 70–99)

## 2010-09-16 ENCOUNTER — Encounter (HOSPITAL_COMMUNITY): Payer: Medicare Other

## 2010-10-07 ENCOUNTER — Encounter (HOSPITAL_COMMUNITY): Payer: Medicare Other | Attending: Interventional Radiology

## 2010-10-07 DIAGNOSIS — M069 Rheumatoid arthritis, unspecified: Secondary | ICD-10-CM | POA: Insufficient documentation

## 2010-11-03 NOTE — Op Note (Signed)
Becky [name], CLAUSING             ACCOUNT NO.:  [id number]   MEDICAL RECORD NO.:  [id number]          PATIENT TYPE:  AMB   LOCATION:  DSC                          FACILITY:  [facility]   PHYSICIAN:  Jefry H. Pollyann Kennedy, MD     DATE OF BIRTH:  Jul 03, 1955   DATE OF PROCEDURE:  08/14/2007  DATE OF DISCHARGE:                               OPERATIVE REPORT   PREOPERATIVE DIAGNOSIS:  Conductive hearing loss.   POSTOPERATIVE DIAGNOSIS:  Left otosclerosis.   PROCEDURE:  Left stapedectomy.   SURGEON:  Jefry H. Pollyann Kennedy, MD   ANESTHESIA:  General endotracheal anesthesia was used.   COMPLICATIONS:  No complications.   BLOOD LOSS:  Minimal.   FINDINGS:  Fixation of the left stapes footplate.  Remainder of the  ossicular chain was intact.   HISTORY:  This is a 55 year old with a history of progressive hearing  loss bilaterally.  Risks, benefits, alternatives, complications of  procedure explained to the patient who seemed to understand and agreed  to surgery.   PROCEDURE:  The patient was taken to the operating room, placed on the  operating table in supine position.  Following induction of general  endotracheal anesthesia, the left ear was prepped and draped in standard  fashion.  1% Xylocaine with epinephrine was infiltrated into the  external auditory canal and multiple spots.  A posteriorly based  tympanomeatal flap was developed and brought forward off the annulus.  The chorda tympani nerve was identified and preserved.  The stapes  curette was used to remove bone off of the posterior aspect of the bony  annulus to facilitate exposure of the oval window.  The ossicular chain  was palpated and the above-mentioned findings were noted.  A tragal  perichondrial graft was harvested, pressed and cut to size and shape.  The incision was reapproximated with 6-0 plain gut.  The anterior  tympanic cavity was packed with saline-soaked Gelfoam pieces.  A control  hole was created in the footplate of the  stapes.  The incudostapedial  joint was divided with joint knife.  Scissors were used to transect the  stapedial tendon.  The stapes superstructure was downfractured to the  promontory and removed with about the posterior 60% of the footplate  still attached.  There was no perilymph gusher identified.  The  perichondrial graft was placed into position.  A Lippe modified Robinson  prosthesis 4 mm length was then inserted without difficulty.  There was  nice stability of the footplate.  The bucket-handle was draped over the  lenticular process.  Some Gelfoam was packed into the round window niche  as there was what appeared to be membranous tear but most likely a  fibrotic tissue as there was no purulence that was draining.  The  tympanomeatal  flap was returned to its normal position and supported with Gelfoam  pieces soaked in Floxin.  A cotton ball with bacitracin was placed at  the external meatus.  The patient was awakened, extubated and  transferred to recovery in stable condition.      Jefry H. Pollyann Kennedy, MD  Electronically Signed  JHR/MEDQ  D:  08/14/2007  T:  08/14/2007  Job:  [id number]

## 2010-11-04 ENCOUNTER — Encounter (HOSPITAL_COMMUNITY): Payer: Medicare Other | Attending: Interventional Radiology

## 2010-11-04 DIAGNOSIS — M069 Rheumatoid arthritis, unspecified: Secondary | ICD-10-CM | POA: Insufficient documentation

## 2010-11-06 NOTE — Op Note (Signed)
[facility]  Patient:    Becky [name], Becky [name] Visit Number: [id number] MRN: [id number]          Service Type: DSU Location: DAY Attending Physician:  Jenean Lindau Proc. Date: 03/17/01 Admit Date:  03/17/2001   CC:         Becky [name], M.D.   Operative Report  PREOPERATIVE DIAGNOSIS:  Dysfunctional uterine bleeding, rule out endometrial polyp.  POSTOPERATIVE DIAGNOSIS:  Dysfunctional uterine bleeding, rule out endometrial hyperplasia.  PROCEDURE:  Hysteroscopy with dilation and curettage.  SURGEON:  Becky [name], M.D.  ANESTHESIA:  MAC sedation with paracervical block.  ESTIMATED BLOOD LOSS:  Less than 50 cc.  SORBITOL NET INTAKE:  10 cc.  SPECIMEN:  Endometrial curettings.  COMPLICATIONS:  None.  INDICATIONS:  Becky [name] is a 55 year old insulin-dependent diabetic, who has had a history of worsening dysfunctional bleeding over the past nine months.  She underwent endometrial sampling in December 2001, which revealed an endometrial polyp with simple hyperplasia with otherwise benign proliferative endometrium and no evidence of atypia.  She subsequently was started on monthly progesterone cycling and initially had an excellent response with regular controlled nonheavy bleeding episodes.  However, for the past three months, she has had gradually heavier and longer bleeding that is not regulated at all by the Prometrium.  She bled continuously from July 24 to August 28.  She underwent pelvic ultrasound with sonohysterogram which revealed a 4.4 mm endometrial stripe and no focal uterine lesions, although there was some undermining of the endometrium and slight posterior irregularity.  It was felt that this was undermining due to the catheter, but a small polyp could not be ruled out.  The patient needed follow-up endometrial sampling, and it was elected to proceed to hysteroscopic evaluation with resection as indicated if a small  polyp was encountered.  She has seen the informed consent film, has voiced her understanding of all the risks, benefits, and alternatives and complications and understands that this will be a diagnostic and potentially therapeutic procedure and agrees to proceed.  She presents now for outpatient surgery.  She is on the outpatient diabetes protocol.  Her fasting blood sugar on the morning of admission was 186.  DESCRIPTION OF PROCEDURE:  The patient was taken to the operating room and after proper identification and consents were ascertained, she was placed on the operating table in the supine position.  After IV sedation was accomplished, she was placed in the Wheeler stirrups, and the perineum and vagina were prepped and draped in a routine sterile fashion.  A transurethral Foley was placed which was removed at the conclusion of the procedure. Bimanual examination confirmed an anterior to mid plane, mobile, normal size uterus.  A speculum was placed in the vagina and the cervix was grasped with a single-tooth tenaculum.  A paracervical block utilizing 10 cc of 1% plain Xylocaine was then placed circumferentially.  The endometrial cavity sounded to 8 cm.  The internal os was gently dilated to a #23 Pratt dilator.  The viewing hysteroscope was then inserted under direct vision.  The endocervical canal was free of lesions.  The endometrial cavity had no focal lesions but was noted to be diffusely thickened and somewhat suspicious for hyperplasia versus proliferative endometrium.  The endometrium was so redundant that the tubal ostia were not visualized with certainty.  The scope was then withdrawn, and sharp curettage productive of a large amount of tissue was then performed. Systematic curettage of the entire uterine cavity  was performed until all surfaces were clean and gritty.  Specimens were sent to pathology.  All instruments were removed.  There was a slight amount of oozing from  the tenaculum site which responded to local pressure.  The patient was awakened and transferred to the recovery room.  Counts were correct.  Net sorbitol intake 10 cc.  Estimated blood loss less than 50 cc.  Complications none. Specimens were sent to pathology.  The patient will be observed and discharged per anesthesia protocol.  She will follow up in the office in 4-6 weeks time or sooner for excessive pain, fever, bleeding, or other concerns.  For the present time, she will not be restarted on her progesterone pending pathology results next week. Attending Physician:  Jenean Lindau DD:  03/17/01 TD:  03/17/01 Job: [id number]

## 2010-12-02 ENCOUNTER — Encounter (HOSPITAL_COMMUNITY): Payer: Medicare Other | Attending: Interventional Radiology

## 2010-12-02 DIAGNOSIS — M069 Rheumatoid arthritis, unspecified: Secondary | ICD-10-CM | POA: Insufficient documentation

## 2010-12-30 ENCOUNTER — Ambulatory Visit (HOSPITAL_COMMUNITY)
Admission: RE | Admit: 2010-12-30 | Discharge: 2010-12-30 | Disposition: A | Discharge status: Home / Self Care | Payer: Medicare Other | Source: Ambulatory Visit | Attending: Rheumatology | Admitting: Rheumatology

## 2010-12-30 ENCOUNTER — Other Ambulatory Visit (HOSPITAL_COMMUNITY): Payer: Self-pay | Admitting: Rheumatology

## 2010-12-30 ENCOUNTER — Encounter (HOSPITAL_COMMUNITY): Payer: Medicare Other | Attending: Interventional Radiology

## 2010-12-30 DIAGNOSIS — R059 Cough, unspecified: Secondary | ICD-10-CM | POA: Insufficient documentation

## 2010-12-30 DIAGNOSIS — R05 Cough: Secondary | ICD-10-CM

## 2010-12-30 DIAGNOSIS — Z8739 Personal history of other diseases of the musculoskeletal system and connective tissue: Secondary | ICD-10-CM | POA: Insufficient documentation

## 2010-12-30 DIAGNOSIS — R011 Cardiac murmur, unspecified: Secondary | ICD-10-CM | POA: Insufficient documentation

## 2010-12-30 DIAGNOSIS — M069 Rheumatoid arthritis, unspecified: Secondary | ICD-10-CM | POA: Insufficient documentation

## 2010-12-30 DIAGNOSIS — E119 Type 2 diabetes mellitus without complications: Secondary | ICD-10-CM | POA: Insufficient documentation

## 2011-01-27 ENCOUNTER — Encounter (HOSPITAL_COMMUNITY): Payer: Medicare Other

## 2011-02-01 ENCOUNTER — Encounter (HOSPITAL_COMMUNITY): Payer: Medicare Other

## 2011-02-03 ENCOUNTER — Encounter (HOSPITAL_COMMUNITY): Payer: Medicare Other | Attending: Interventional Radiology

## 2011-02-03 DIAGNOSIS — M069 Rheumatoid arthritis, unspecified: Secondary | ICD-10-CM | POA: Insufficient documentation

## 2011-03-03 ENCOUNTER — Encounter (HOSPITAL_COMMUNITY): Payer: Medicare Other

## 2011-03-12 LAB — BASIC METABOLIC PANEL
BUN: 14
CO2: 28
Calcium: 9
Chloride: 108
Creatinine, Ser: 0.76
GFR calc Af Amer: >60
GFR calc non Af Amer: >60
Glucose, Bld: 201 — ABNORMAL HIGH
Potassium: 4.6
Sodium: 141

## 2011-03-12 LAB — POCT HEMOGLOBIN-HEMACUE: Hemoglobin: 15.2 — ABNORMAL HIGH

## 2011-03-31 ENCOUNTER — Encounter (HOSPITAL_COMMUNITY): Payer: Medicare Other

## 2011-04-05 ENCOUNTER — Encounter (HOSPITAL_COMMUNITY)
Admission: RE | Admit: 2011-04-05 | Discharge: 2011-04-05 | Disposition: A | Discharge status: Home / Self Care | Payer: Medicare Other | Source: Ambulatory Visit | Attending: Interventional Radiology | Admitting: Interventional Radiology

## 2011-04-05 DIAGNOSIS — M069 Rheumatoid arthritis, unspecified: Secondary | ICD-10-CM | POA: Insufficient documentation

## 2011-04-29 ENCOUNTER — Other Ambulatory Visit (HOSPITAL_COMMUNITY): Payer: Self-pay | Admitting: *Deleted

## 2011-05-03 ENCOUNTER — Encounter (HOSPITAL_COMMUNITY): Payer: Medicare Other

## 2011-05-17 ENCOUNTER — Encounter (HOSPITAL_COMMUNITY)
Admission: RE | Admit: 2011-05-17 | Discharge: 2011-05-17 | Disposition: A | Discharge status: Home / Self Care | Payer: Medicare Other | Source: Ambulatory Visit | Attending: Interventional Radiology | Admitting: Interventional Radiology

## 2011-05-17 DIAGNOSIS — M069 Rheumatoid arthritis, unspecified: Secondary | ICD-10-CM | POA: Insufficient documentation

## 2011-05-17 MED ORDER — SODIUM CHLORIDE 0.9 % IV SOLN
750.0000 mg | INTRAVENOUS | Status: DC
  Administered 2011-05-17: 750 mg via INTRAVENOUS
  Filled 2011-05-17: qty 30

## 2011-06-17 ENCOUNTER — Encounter (HOSPITAL_COMMUNITY): Payer: Medicare Other

## 2011-06-24 ENCOUNTER — Inpatient Hospital Stay (HOSPITAL_COMMUNITY): Admission: RE | Admit: 2011-06-24 | Payer: Medicare Other | Source: Ambulatory Visit

## 2011-07-05 ENCOUNTER — Encounter: Payer: Self-pay | Admitting: Cardiology

## 2011-11-15 ENCOUNTER — Encounter: Payer: Self-pay | Admitting: *Deleted

## 2012-01-03 LAB — HM PAP SMEAR: HM Pap smear: NEGATIVE

## 2012-01-13 ENCOUNTER — Ambulatory Visit (HOSPITAL_COMMUNITY): Payer: Medicare Other | Attending: Cardiology | Admitting: Radiology

## 2012-01-13 VITALS — BP 142/78 | Ht 64.0 in | Wt 124.0 lb

## 2012-01-13 DIAGNOSIS — E119 Type 2 diabetes mellitus without complications: Secondary | ICD-10-CM | POA: Insufficient documentation

## 2012-01-13 DIAGNOSIS — R0789 Other chest pain: Secondary | ICD-10-CM

## 2012-01-13 DIAGNOSIS — Z794 Long term (current) use of insulin: Secondary | ICD-10-CM | POA: Insufficient documentation

## 2012-01-13 DIAGNOSIS — R079 Chest pain, unspecified: Secondary | ICD-10-CM

## 2012-01-13 MED ORDER — TECHNETIUM TC 99M TETROFOSMIN IV KIT
10.0000 | PACK | Freq: Once | INTRAVENOUS | Status: AC | PRN
  Administered 2012-01-13: 10 via INTRAVENOUS

## 2012-01-13 MED ORDER — TECHNETIUM TC 99M TETROFOSMIN IV KIT
30.0000 | PACK | Freq: Once | INTRAVENOUS | Status: AC | PRN
  Administered 2012-01-13: 30 via INTRAVENOUS

## 2012-01-13 NOTE — Progress Notes (Signed)
[facility] 3 NUCLEAR MED [address] [place] [address] [phone]  Cardiology Nuclear Med Study  Becky [name] is a 56 y.o. female     MRN : [id number]     DOB: February 19, 1956  Procedure Date: 01/13/2012  Nuclear Med Background Indication for Stress Test:  Evaluation for Ischemia History:  4/04 ECHO: EF: 65-70% Cardiac Risk Factors: IDDM Type 2  Symptoms:  Chest Pain and Radiates to her (L) arm and back. feels like a dog sitting on her chest. Last episode was 01/13/12   Nuclear Pre-Procedure Caffeine/Decaff Intake:  None >12 hrs NPO After: 7:30pm   Lungs:  clear O2 Sat: 97% on room air. IV 0.9% NS with Angio Cath:  22g  IV Site: R Antecubital x 1, tolerated well IV Started by:  Irean Hong, RN  Chest Size (in):  34 Cup Size: C  Height: 5\' 4"  (1.626 m)  Weight:  124 lb (56.246 kg)  BMI:  Body mass index is 21.28 kg/(m^2). Tech Comments:  FBS 110 at 6:00am, Patient disconnected  insulin pump. FBS at 8:30am was 180, insulin pump reattached but no bolus. This patient walked on the treadmill and got chest tightness 4/10. She also had (+) changes. DOD Dr. Wylene Simmer consulted, he stated she needed a cardiology consult which is scheduled on Thursday January 20, 2012 with Peter [place]azil[place] MD she was sent home and advised to call 911 if the tightness became significant or worse than what she had here. She was pain free when she left here.    Nuclear Med Study 1 or 2 day study: 1 day  Stress Test Type:  Stress  Reading MD: Marca Ancona, MD  Order Authorizing Provider:  Adrian Prince, MD  Resting Radionuclide: Technetium 95m Tetrofosmin  Resting Radionuclide Dose: 10.5 mCi   Stress Radionuclide:  Technetium 48m Tetrofosmin  Stress Radionuclide Dose: 32.9 mCi           Stress Protocol Rest HR: 85 Stress HR: 142  Rest BP: 142/78 Stress BP: 192/72  Exercise Time (min): 8:00 METS: 10.10   Predicted Max HR: 165 bpm % Max HR: 86.06 bpm Rate Pressure Product:  62952   Dose of Adenosine (mg):  n/a Dose of Lexiscan: n/a mg  Dose of Atropine (mg): n/a Dose of Dobutamine: n/a mcg/kg/min (at max HR)  Stress Test Technologist: Milana Na, EMT-P  Nuclear Technologist:  Domenic Polite, CNMT     Rest Procedure:  Myocardial perfusion imaging was performed at rest 45 minutes following the intravenous administration of Technetium 53m Tetrofosmin. Rest ECG: NSR - Normal EKG  Stress Procedure:  The patient performed treadmill exercise using a Bruce  Protocol for 8:00 minutes. The patient stopped due to fatigue and chest tightness (4 out of 10).  There were + significant ST-T wave changes.  Technetium 57m Tetrofosmin was injected at peak exercise and myocardial perfusion imaging was performed after a brief delay. Stress ECG: Significant ST abnormalities consistent with ischemia.  QPS Raw Data Images:  Normal; no motion artifact; normal heart/lung ratio. Stress Images:  Normal homogeneous uptake in all areas of the myocardium. Rest Images:  Normal homogeneous uptake in all areas of the myocardium. Subtraction (SDS):  There is no evidence of scar or ischemia. Transient Ischemic Dilatation (Normal <1.22):  1.18 Lung/Heart Ratio (Normal <0.45):  0.25  Quantitative Gated Spect Images QGS EDV:  58 ml QGS ESV:  13 ml  Impression Exercise Capacity:  Good exercise capacity. BP Response:  Hypertensive blood  pressure response. Clinical Symptoms:  Chest tightness.  ECG Impression:  1 mm horizontal ST depression in V5/V6 at peak stress that persisted to a lesser degree into recovery.  Comparison with Prior Nuclear Study: No previous nuclear study performed  Overall Impression:  Chest pain with ST changes on exercise ECG, but perfusion images showed no evidence for ischemia or infarction.  TID ratio was not significantly elevated.  It is possible that this represents microvascular angina.   LV Ejection Fraction: 78%.  LV Wall Motion:  NL LV Function; NL Wall  Motion  Cardiology consultation arranged.   Marca Ancona 01/13/2012

## 2012-01-14 NOTE — Progress Notes (Signed)
Nuclear report faxed to Dr. Adrian Prince. Becky [name], Becky [name]

## 2012-01-17 ENCOUNTER — Encounter (HOSPITAL_COMMUNITY): Payer: Self-pay | Admitting: Endocrinology

## 2012-01-20 ENCOUNTER — Encounter: Payer: Self-pay | Admitting: Cardiology

## 2012-01-20 ENCOUNTER — Ambulatory Visit (INDEPENDENT_AMBULATORY_CARE_PROVIDER_SITE_OTHER): Payer: Medicare Other | Admitting: Cardiology

## 2012-01-20 VITALS — BP 122/68 | HR 95 | Ht 64.0 in | Wt 127.0 lb

## 2012-01-20 DIAGNOSIS — IMO0001 Reserved for inherently not codable concepts without codable children: Secondary | ICD-10-CM

## 2012-01-20 DIAGNOSIS — M069 Rheumatoid arthritis, unspecified: Secondary | ICD-10-CM

## 2012-01-20 DIAGNOSIS — R931 Abnormal findings on diagnostic imaging of heart and coronary circulation: Secondary | ICD-10-CM

## 2012-01-20 DIAGNOSIS — R9439 Abnormal result of other cardiovascular function study: Secondary | ICD-10-CM

## 2012-01-20 DIAGNOSIS — Z9889 Other specified postprocedural states: Secondary | ICD-10-CM

## 2012-01-20 DIAGNOSIS — E119 Type 2 diabetes mellitus without complications: Secondary | ICD-10-CM

## 2012-01-20 DIAGNOSIS — R079 Chest pain, unspecified: Secondary | ICD-10-CM

## 2012-01-20 HISTORY — DX: Other specified postprocedural states: Z98.890

## 2012-01-20 MED ORDER — METOPROLOL SUCCINATE ER 50 MG PO TB24: 50.0000 mg | ORAL_TABLET | Freq: Every day | ORAL | Status: DC

## 2012-01-20 NOTE — Progress Notes (Signed)
 Becky [name] Date of Birth: 05/03/1956 Medical Record #[id number]  History of Present Illness: Becky [name] is seen at the request of Dr. South for evaluation of chest pain. She is a very pleasant 56-year-old white female who has a long-standing history of insulin-dependent diabetes mellitus and rheumatoid arthritis. She reports a six-month history of chest pain. Initially this was described as a weird feeling in her chest with a sensation of pinging. Over the past 3 months this has progressed to a more severe pain described as a squeezing in her mid chest. This has progressed to where she is having more frequent symptoms 3-4 times per day. Her symptoms seem to occur every morning when she first gets up. It is associated with shortness of breath. Sometimes it radiates into her back. It feels as though something is sitting on her chest. This past Monday while shopping she developed chest pain and became clammy and sweaty. She was evaluated in June of 2010 with chest pain and had some type of stress test at that time which looked okay. She reports a history of a heart murmur. She has had swelling in her legs in the past for which she takes Lasix. She recently had a stress Myoview. She was able to exercise for 8 minutes on the Bruce protocol. She did experience chest pain with exertion. She had ST-T wave changes consistent with ischemia but her nuclear images were normal and her ejection fraction was normal. She is very concerned about her cardiac risk.  Current Outpatient Prescriptions on File Prior to Visit  Medication Sig Dispense Refill  . aspirin 81 MG tablet Take 81 mg by mouth daily.      . calcium carbonate (OS-CAL) 600 MG TABS Take 600 mg by mouth daily.      . DULoxetine (CYMBALTA) 60 MG capsule Take 60 mg by mouth daily.      . folic acid (FOLVITE) 1 MG tablet Take 1 mg by mouth daily.      . furosemide (LASIX) 20 MG tablet Take 20 mg by mouth every other day.      . insulin aspart  (NOVOLOG) 100 UNIT/ML injection Insulin pump      . lansoprazole (PREVACID) 15 MG capsule Take 15 mg by mouth daily.      . leflunomide (ARAVA) 20 MG tablet Take 20 mg by mouth daily.      . losartan (COZAAR) 25 MG tablet Take 25 mg by mouth daily.      . pravastatin (PRAVACHOL) 20 MG tablet Take 20 mg by mouth daily.      . SUMAtriptan (IMITREX) 100 MG tablet Take 100 mg by mouth every 2 (two) hours as needed.      . topiramate (TOPAMAX) 200 MG tablet Take 200 mg by mouth daily.      . metoprolol succinate (TOPROL XL) 50 MG 24 hr tablet Take 1 tablet (50 mg total) by mouth daily. Take with or immediately following a meal.  30 tablet  0    Allergies  Allergen Reactions  . Ciprofloxacin   . Penicillins     Past Medical History  Diagnosis Date  . IDDM (insulin dependent diabetes mellitus) 1969  . RA (rheumatoid arthritis) 2000  . Migraine headache   . Abnormal ECG   . Hypercholesterolemia   . Sjoegren syndrome   . Otosclerosis     Past Surgical History  Procedure Date  . Breast lumpectomy 1995  . Carpal tunnel release 2000  . Toe surgery   2000    bone removed  . Cesarean section 1987  . Lymph node dissection 2002    History  Smoking status  . Never Smoker   Smokeless tobacco  . Not on file    History  Alcohol Use  . Yes    occasional    History reviewed. No pertinent family history.  Review of Systems: The review of systems is positive for chronic rheumatoid arthritis. She has had insulin-dependent diabetes mellitus for 44 years. She also has Sjogren's syndrome. All other systems were reviewed and are negative.  Physical Exam: BP 122/68  Pulse 95  Ht 5' 4" (1.626 m)  Wt 57.607 kg (127 lb)  BMI 21.80 kg/m2  SpO2 99% Is a very pleasant white female in no acute distress.The patient is alert and oriented x 3.  The mood and affect are normal.  The skin is warm and dry.  Color is normal.  The HEENT exam reveals that the sclera are nonicteric.  The mucous membranes  are moist.  The carotids are 2+ without bruits.  There is no thyromegaly.  There is no JVD.  The lungs are clear.  The chest wall is non tender.  The heart exam reveals a regular rate with a normal S1 and S2.  There is a soft 1/6 systolic ejection murmur the right upper sternal border..  The PMI is not displaced.   Abdominal exam reveals good bowel sounds.  There is no guarding or rebound.  There is no hepatosplenomegaly or tenderness.  There are no masses.  Exam of the legs reveal no clubbing, cyanosis, or edema.  The legs are without rashes.  She has mild rheumatoid deformities in her hands. The distal pulses are intact.  Cranial nerves II - XII are intact.  Motor and sensory functions are intact.  The gait is normal.  LABORATORY DATA: ECG demonstrates normal sinus rhythm with a normal ECG.  Assessment / Plan:   

## 2012-01-20 NOTE — Patient Instructions (Signed)
We will schedule you for a cardiac CT angiogram  Start Toprol XL 50 mg daily.  Continue your other medication

## 2012-01-20 NOTE — Assessment & Plan Note (Signed)
She has progressive chest pain symptoms. Her stress Myoview study demonstrates discordant results. She has chest pain and ECG changes but her imaging is normal. This clearly needs to be evaluated further. She has long-standing insulin-dependent diabetes mellitus, hyperlipidemia, and hypertension. We discussed the options for further evaluation. These include noninvasive evaluation with cardiac CT angiography. I think she would be a good candidate for this. The other option would be cardiac catheterization. We discussed the advantages and disadvantages of each of these studies. We have elected to proceed with cardiac CTA. I will initiate her on metoprolol 50 mg daily. If her CTA is normal then we can focus on other potential causes of chest pain. If she has significant coronary disease/plaque burden she may need cardiac catheterization to define her best treatment strategy. He'll be interesting to see how she responds to beta blocker therapy and its antianginal effects.

## 2012-01-24 ENCOUNTER — Encounter: Payer: Self-pay | Admitting: *Deleted

## 2012-01-24 ENCOUNTER — Telehealth: Payer: Self-pay | Admitting: Cardiology

## 2012-01-24 DIAGNOSIS — R9439 Abnormal result of other cardiovascular function study: Secondary | ICD-10-CM

## 2012-01-24 NOTE — Telephone Encounter (Signed)
Pt was in last week and told she needs another procedure and if she hasn't been contacted by today to call us

## 2012-01-24 NOTE — Telephone Encounter (Signed)
Per Dr Swaziland cancel cardiac cta // insurance preference.  LHC scheduled for 01/28/12 @ 9:30, pt will come for labs 01/26/12 and pick up [facility] letter, pt agreed to plan. Pt had cxr 12/2010, xray not ordered.

## 2012-01-25 ENCOUNTER — Other Ambulatory Visit: Payer: Self-pay | Admitting: Cardiology

## 2012-01-25 ENCOUNTER — Other Ambulatory Visit: Payer: Self-pay

## 2012-01-25 DIAGNOSIS — R079 Chest pain, unspecified: Secondary | ICD-10-CM

## 2012-01-26 ENCOUNTER — Other Ambulatory Visit (INDEPENDENT_AMBULATORY_CARE_PROVIDER_SITE_OTHER): Payer: Medicare Other

## 2012-01-26 ENCOUNTER — Ambulatory Visit
Admission: RE | Admit: 2012-01-26 | Discharge: 2012-01-26 | Disposition: A | Discharge status: Home / Self Care | Payer: Medicare Other | Source: Ambulatory Visit | Attending: Cardiology | Admitting: Cardiology

## 2012-01-26 DIAGNOSIS — R079 Chest pain, unspecified: Secondary | ICD-10-CM

## 2012-01-26 DIAGNOSIS — R9439 Abnormal result of other cardiovascular function study: Secondary | ICD-10-CM

## 2012-01-26 LAB — CBC WITH DIFFERENTIAL/PLATELET
Basophils Absolute: 0 10*3/uL (ref 0.0–0.1)
Basophils Relative: 0.5 % (ref 0.0–3.0)
Eosinophils Absolute: 1 10*3/uL — ABNORMAL HIGH (ref 0.0–0.7)
Eosinophils Relative: 16.4 % — ABNORMAL HIGH (ref 0.0–5.0)
HCT: 36.8 % (ref 36.0–46.0)
Hemoglobin: 11.9 g/dL — ABNORMAL LOW (ref 12.0–15.0)
Lymphocytes Relative: 16.1 % (ref 12.0–46.0)
Lymphs Abs: 1 10*3/uL (ref 0.7–4.0)
MCHC: 32.3 g/dL (ref 30.0–36.0)
MCV: 92.3 fl (ref 78.0–100.0)
Monocytes Absolute: 0.2 10*3/uL (ref 0.1–1.0)
Monocytes Relative: 3.7 % (ref 3.0–12.0)
Neutro Abs: 3.9 10*3/uL (ref 1.4–7.7)
Neutrophils Relative %: 63.3 % (ref 43.0–77.0)
Platelets: 248 10*3/uL (ref 150.0–400.0)
RBC: 3.99 Mil/uL (ref 3.87–5.11)
RDW: 16.6 % — ABNORMAL HIGH (ref 11.5–14.6)
WBC: 6.1 10*3/uL (ref 4.5–10.5)

## 2012-01-26 LAB — PROTIME-INR
INR: 0.9 ratio (ref 0.8–1.0)
Prothrombin Time: 10.3 s (ref 10.2–12.4)

## 2012-01-26 LAB — BASIC METABOLIC PANEL
BUN: 19 mg/dL (ref 6–23)
CO2: 24 mEq/L (ref 19–32)
Calcium: 9.1 mg/dL (ref 8.4–10.5)
Chloride: 107 mEq/L (ref 96–112)
Creatinine, Ser: 0.7 mg/dL (ref 0.4–1.2)
GFR: 92.2 mL/min (ref >60.00)
Glucose, Bld: 250 mg/dL — ABNORMAL HIGH (ref 70–99)
Potassium: 4 mEq/L (ref 3.5–5.1)
Sodium: 139 mEq/L (ref 135–145)

## 2012-01-28 ENCOUNTER — Encounter (HOSPITAL_BASED_OUTPATIENT_CLINIC_OR_DEPARTMENT_OTHER)
Admission: RE | Disposition: A | Discharge status: Home / Self Care | Payer: Self-pay | Source: Ambulatory Visit | Attending: Cardiology

## 2012-01-28 ENCOUNTER — Inpatient Hospital Stay (HOSPITAL_BASED_OUTPATIENT_CLINIC_OR_DEPARTMENT_OTHER)
Admission: RE | Admit: 2012-01-28 | Discharge: 2012-01-28 | Disposition: A | Discharge status: Home / Self Care | Payer: Medicare Other | Source: Ambulatory Visit | Attending: Cardiology | Admitting: Cardiology

## 2012-01-28 DIAGNOSIS — R079 Chest pain, unspecified: Secondary | ICD-10-CM

## 2012-01-28 DIAGNOSIS — M069 Rheumatoid arthritis, unspecified: Secondary | ICD-10-CM | POA: Insufficient documentation

## 2012-01-28 DIAGNOSIS — R0602 Shortness of breath: Secondary | ICD-10-CM | POA: Insufficient documentation

## 2012-01-28 DIAGNOSIS — Z794 Long term (current) use of insulin: Secondary | ICD-10-CM | POA: Insufficient documentation

## 2012-01-28 DIAGNOSIS — E119 Type 2 diabetes mellitus without complications: Secondary | ICD-10-CM | POA: Insufficient documentation

## 2012-01-28 DIAGNOSIS — M3500 Sicca syndrome, unspecified: Secondary | ICD-10-CM | POA: Insufficient documentation

## 2012-01-28 LAB — POCT I-STAT GLUCOSE
Glucose, Bld: 186 mg/dL — ABNORMAL HIGH (ref 70–99)
Operator id: 141321

## 2012-01-28 SURGERY — JV LEFT HEART CATHETERIZATION WITH CORONARY ANGIOGRAM
Anesthesia: Moderate Sedation

## 2012-01-28 MED ORDER — ACETAMINOPHEN 325 MG PO TABS: 650.0000 mg | ORAL_TABLET | ORAL | Status: DC | PRN

## 2012-01-28 MED ORDER — ASPIRIN 81 MG PO CHEW
324.0000 mg | CHEWABLE_TABLET | ORAL | Status: AC
  Administered 2012-01-28: 324 mg via ORAL

## 2012-01-28 MED ORDER — ONDANSETRON HCL 4 MG/2ML IJ SOLN
4.0000 mg | Freq: Four times a day (QID) | INTRAMUSCULAR | Status: DC | PRN
Start: 2012-01-28 — End: 2012-01-28

## 2012-01-28 MED ORDER — DIAZEPAM 5 MG PO TABS
5.0000 mg | ORAL_TABLET | ORAL | Status: AC
  Administered 2012-01-28: 5 mg via ORAL

## 2012-01-28 MED ORDER — SODIUM CHLORIDE 0.9 % IV SOLN: 1.0000 mL/kg/h | INTRAVENOUS | Status: DC

## 2012-01-28 MED ORDER — SODIUM CHLORIDE 0.9 % IV SOLN
INTRAVENOUS | Status: DC
  Administered 2012-01-28: 09:00:00 via INTRAVENOUS

## 2012-01-28 MED ORDER — SODIUM CHLORIDE 0.9 % IV SOLN: 250.0000 mL | INTRAVENOUS | Status: DC | PRN

## 2012-01-28 MED ORDER — SODIUM CHLORIDE 0.9 % IJ SOLN: 3.0000 mL | INTRAMUSCULAR | Status: DC | PRN

## 2012-01-28 MED ORDER — SODIUM CHLORIDE 0.9 % IJ SOLN: 3.0000 mL | Freq: Two times a day (BID) | INTRAMUSCULAR | Status: DC

## 2012-01-28 NOTE — CV Procedure (Signed)
   Cardiac Catheterization Procedure Note  Name: Becky [name] MRN: [id number] DOB: 02/10/1956  Procedure: Left Heart Cath, Selective Coronary Angiography, LV angiography  Indication: 56 year old white female with long-standing history this included diabetes mellitus presents with symptoms of chest pain. Nuclear stress test is discordant with some symptoms of chest pain and ECG changes but normal Myoview imaging.   Procedural Details: The right wrist was prepped, draped, and anesthetized with 1% lidocaine. Using the modified Seldinger technique, a 5 French sheath was introduced into the right radial artery. 3 mg of verapamil was administered through the sheath, weight-based unfractionated heparin was administered intravenously. Standard Judkins catheters were used for selective coronary angiography and left ventriculography. Catheter exchanges were performed over an exchange length guidewire. There were no immediate procedural complications. A TR band was used for radial hemostasis at the completion of the procedure.  The patient was transferred to the post catheterization recovery area for further monitoring.  Procedural Findings: Hemodynamics: AO 130/49 with a mean of 83 mmHg LV 127/22 mmHg  Coronary angiography: Coronary dominance: right  Left mainstem: Normal  Left anterior descending (LAD): Normal  Left circumflex (LCx): Normal  Right coronary artery (RCA): Normal  Left ventriculography: Left ventricular systolic function is normal, LVEF is estimated at 55-65%, there appeared to be moderate mitral insufficiency but this was felt to be related to the pigtail catheter being under the papillary muscle.  Final Conclusions:   1. Normal coronary anatomy. 2. Normal left ventricular function.    Recommendations:  recommend continued risk factor modification.  Theron Arista [facility] 01/28/2012, 11:41 AM

## 2012-01-28 NOTE — Interval H&P Note (Signed)
History and Physical Interval Note:  01/28/2012 11:17 AM  Houa Wayna Chalet  has presented today for surgery, with the diagnosis of abnormal nuc.  The various methods of treatment have been discussed with the patient and family. After consideration of risks, benefits and other options for treatment, the patient has consented to  Procedure(s) (LRB): JV LEFT HEART CATHETERIZATION WITH CORONARY ANGIOGRAM (N/A) as a surgical intervention .  The patient's history has been reviewed, patient examined, no change in status, stable for surgery.  I have reviewed the patient's chart and labs.  Questions were answered to the patient's satisfaction.     Theron Arista [facility] 01/28/2012 11:17 AM

## 2012-01-28 NOTE — H&P (View-Only) (Signed)
[name] Date of Birth: [birth year] Medical Record #[id number]  History of Present Illness: [name] is seen at the request of Dr. Evlyn Kanner for evaluation of chest pain. She is a very pleasant 56 year old white female who has a long-standing history of insulin-dependent diabetes mellitus and rheumatoid arthritis. She reports a six-month history of chest pain. Initially this was described as a weird feeling in her chest with a sensation of pinging. Over the past 3 months this has progressed to a more severe pain described as a squeezing in her mid chest. This has progressed to where she is having more frequent symptoms 3-4 times per day. Her symptoms seem to occur every morning when she first gets up. It is associated with shortness of breath. Sometimes it radiates into her back. It feels as though something is sitting on her chest. This past Monday while shopping she developed chest pain and became clammy and sweaty. She was evaluated in June of 2010 with chest pain and had some type of stress test at that time which looked okay. She reports a history of a heart murmur. She has had swelling in her legs in the past for which she takes Lasix. She recently had a stress Myoview. She was able to exercise for 8 minutes on the Bruce protocol. She did experience chest pain with exertion. She had ST-T wave changes consistent with ischemia but her nuclear images were normal and her ejection fraction was normal. She is very concerned about her cardiac risk.  Current Outpatient Prescriptions on File Prior to Visit  Medication Sig Dispense Refill  . aspirin 81 MG tablet Take 81 mg by mouth daily.      . calcium carbonate (OS-CAL) 600 MG TABS Take 600 mg by mouth daily.      . DULoxetine (CYMBALTA) 60 MG capsule Take 60 mg by mouth daily.      . folic acid (FOLVITE) 1 MG tablet Take 1 mg by mouth daily.      . furosemide (LASIX) 20 MG tablet Take 20 mg by mouth every other day.      . insulin aspart  (NOVOLOG) 100 UNIT/ML injection Insulin pump      . lansoprazole (PREVACID) 15 MG capsule Take 15 mg by mouth daily.      Marland Kitchen leflunomide (ARAVA) 20 MG tablet Take 20 mg by mouth daily.      Marland Kitchen losartan (COZAAR) 25 MG tablet Take 25 mg by mouth daily.      . pravastatin (PRAVACHOL) 20 MG tablet Take 20 mg by mouth daily.      . SUMAtriptan (IMITREX) 100 MG tablet Take 100 mg by mouth every 2 (two) hours as needed.      . topiramate (TOPAMAX) 200 MG tablet Take 200 mg by mouth daily.      . metoprolol succinate (TOPROL XL) 50 MG 24 hr tablet Take 1 tablet (50 mg total) by mouth daily. Take with or immediately following a meal.  30 tablet  0    Allergies  Allergen Reactions  . Ciprofloxacin   . Penicillins     Past Medical History  Diagnosis Date  . IDDM (insulin dependent diabetes mellitus) 1969  . RA (rheumatoid arthritis) 2000  . Migraine headache   . Abnormal ECG   . Hypercholesterolemia   . Sjoegren syndrome   . Otosclerosis     Past Surgical History  Procedure Date  . Breast lumpectomy 1995  . Carpal tunnel release 2000  . Toe surgery  2000    bone removed  . Cesarean section 1987  . Lymph node dissection 2002    History  Smoking status  . Never Smoker   Smokeless tobacco  . Not on file    History  Alcohol Use  . Yes    occasional    History reviewed. No pertinent family history.  Review of Systems: The review of systems is positive for chronic rheumatoid arthritis. She has had insulin-dependent diabetes mellitus for 44 years. She also has Sjogren's syndrome. All other systems were reviewed and are negative.  Physical Exam: BP 122/68  Pulse 95  Ht 5\' 4"  (1.626 m)  Wt 57.607 kg (127 lb)  BMI 21.80 kg/m2  SpO2 99% Is a very pleasant white female in no acute distress.The patient is alert and oriented x 3.  The mood and affect are normal.  The skin is warm and dry.  Color is normal.  The HEENT exam reveals that the sclera are nonicteric.  The mucous membranes  are moist.  The carotids are 2+ without bruits.  There is no thyromegaly.  There is no JVD.  The lungs are clear.  The chest wall is non tender.  The heart exam reveals a regular rate with a normal S1 and S2.  There is a soft 1/6 systolic ejection murmur the right upper sternal border..  The PMI is not displaced.   Abdominal exam reveals good bowel sounds.  There is no guarding or rebound.  There is no hepatosplenomegaly or tenderness.  There are no masses.  Exam of the legs reveal no clubbing, cyanosis, or edema.  The legs are without rashes.  She has mild rheumatoid deformities in her hands. The distal pulses are intact.  Cranial nerves II - XII are intact.  Motor and sensory functions are intact.  The gait is normal.  LABORATORY DATA: ECG demonstrates normal sinus rhythm with a normal ECG.  Assessment / Plan:

## 2012-01-28 NOTE — Progress Notes (Signed)
Last note and error was noted.  Actually 18 cc was initial amt of air in tr band, so 4 cc was removed with good results.

## 2012-01-28 NOTE — Progress Notes (Signed)
TR band too tight, oxygen saturation waveform poor, right hand numb and throbbie feeling.  4 cc of air removed from TR band with good results. spo2 sats improved with good waveform and sats up to 99%, no bleeding from site and hand much more comfortable .

## 2012-02-07 ENCOUNTER — Encounter: Payer: Self-pay | Admitting: Nurse Practitioner

## 2012-02-07 ENCOUNTER — Ambulatory Visit (INDEPENDENT_AMBULATORY_CARE_PROVIDER_SITE_OTHER): Payer: Medicare Other | Admitting: Nurse Practitioner

## 2012-02-07 VITALS — BP 140/70 | HR 76 | Resp 20 | Ht 64.0 in | Wt 129.0 lb

## 2012-02-07 DIAGNOSIS — R079 Chest pain, unspecified: Secondary | ICD-10-CM

## 2012-02-07 NOTE — Progress Notes (Signed)
Becky [name] Date of Birth: 05/12/1956 Medical Record #[id number]  History of Present Illness: Becky [name] is seen back today for a post cath visit. She is seen for Dr. Swaziland. She has had some atypical chest pain and had recent cardiac cath following nuclear stress testing which showed ECG changes but normal imaging. Her medical history includes long standing diabetes for over 40 years, HLD, RA, Sjoegren's syndrome and migraines.  She comes in today. She is here alone. Her catheterization was normal. Her cath access site (right wrist) is doing well. She feels fine. She still has some occasional chest pain, it is mostly after eating. She tells me that Dr. Swaziland was wondering if her symptoms are related to her RA.  She says she feels full and then her chest will make some funny noises. Not really nauseated. She remains very active. She is tolerating her medicines. She has no real complaint overall.   Current Outpatient Prescriptions on File Prior to Visit  Medication Sig Dispense Refill  . aspirin 81 MG tablet Take 81 mg by mouth daily.      Marland Kitchen azithromycin (AZASITE) 1 % ophthalmic solution 1 drop once daily for a week then off a week (rotating)      . calcium carbonate (OS-CAL) 600 MG TABS Take 600 mg by mouth daily.      . cycloSPORINE (RESTASIS) 0.05 % ophthalmic emulsion Place 1 drop into both eyes 2 (two) times daily.      . DULoxetine (CYMBALTA) 60 MG capsule Take 60 mg by mouth daily.      . fish oil-omega-3 fatty acids 1000 MG capsule Take 1 g by mouth daily.      . folic acid (FOLVITE) 1 MG tablet Take 1 mg by mouth daily.      . furosemide (LASIX) 20 MG tablet Take 20 mg by mouth every other day.      . insulin aspart (NOVOLOG) 100 UNIT/ML injection Insulin pump      . lansoprazole (PREVACID) 15 MG capsule Take 15 mg by mouth daily.      Marland Kitchen leflunomide (ARAVA) 20 MG tablet Take 20 mg by mouth daily.      Marland Kitchen losartan (COZAAR) 25 MG tablet Take 25 mg by mouth daily.      .  methotrexate 1 G injection Inject into the vein. 25mg  injected weekly 0.6 ml      . Multiple Vitamin (MULTIVITAMIN) tablet Take 1 tablet by mouth daily.      Marland Kitchen POLYETHYLENE GLYCOL 3350 PO Take by mouth daily.      . pravastatin (PRAVACHOL) 20 MG tablet Take 20 mg by mouth daily.      . SUMAtriptan (IMITREX) 100 MG tablet Take 100 mg by mouth every 2 (two) hours as needed.      . topiramate (TOPAMAX) 200 MG tablet Take 200 mg by mouth daily.      . traMADol (ULTRAM) 50 MG tablet Take 50 mg by mouth as needed.      . vitamin B-12 (CYANOCOBALAMIN) 1000 MCG tablet Take 1,000 mcg by mouth daily.      . Vitamin D, Ergocalciferol, (DRISDOL) 50000 UNITS CAPS Take 50,000 Units by mouth every 7 (seven) days.      . nabumetone (RELAFEN) 500 MG tablet Take 500 mg by mouth as needed.        Allergies  Allergen Reactions  . Ciprofloxacin   . Penicillins     Past Medical History  Diagnosis Date  .  IDDM (insulin dependent diabetes mellitus) 1969  . RA (rheumatoid arthritis) 2000  . Migraine headache   . Abnormal ECG   . Hypercholesterolemia   . Sjoegren syndrome   . Otosclerosis   . S/P cardiac cath August 2013    Normal coronaries and normal LV function.    Past Surgical History  Procedure Date  . Breast lumpectomy 1995  . Carpal tunnel release 2000  . Toe surgery 2000    bone removed  . Cesarean section 1987  . Lymph node dissection 2002    History  Smoking status  . Never Smoker   Smokeless tobacco  . Not on file    History  Alcohol Use  . Yes    occasional    History reviewed. No pertinent family history.  Review of Systems: The review of systems is per the HPI.  All other systems were reviewed and are negative.  Physical Exam: BP 140/70  Pulse 76  Resp 20  Ht 5\' 4"  (1.626 m)  Wt 129 lb (58.514 kg)  BMI 22.14 kg/m2 Patient is very pleasant and in no acute distress. Skin is warm and dry. Color is normal.  HEENT is unremarkable. Normocephalic/atraumatic. PERRL.  Sclera are nonicteric. Neck is supple. No masses. No JVD. Lungs are clear. Cardiac exam shows a regular rate and rhythm. Abdomen is soft. Extremities are without edema. Right wrist looks good. Gait and ROM are intact. No gross neurologic deficits noted.   LABORATORY DATA: N/A  Cardiac Cath Final Conclusions:   1. Normal coronary anatomy.  2. Normal left ventricular function.  Recommendations: recommend continued risk factor modification.   Theron Arista [facility]  01/28/2012, 11:41 AM  Assessment / Plan:

## 2012-02-07 NOTE — Assessment & Plan Note (Signed)
She is s/p cardiac cath which was normal. Risk factor modification is encouraged. She may have some gastric paresis. She sees Dr. Evlyn Kanner next month. From our standpoint, she is felt to be stable. We will see her back prn and otherwise defer her management to primary care. Patient is agreeable to this plan and will call if any problems develop in the interim.

## 2012-02-07 NOTE — Patient Instructions (Signed)
Stay on your current medicines  Keep taking good care of yourself  We will see you as needed.  Call the [facility] office at [phone] if you have any questions, problems or concerns.

## 2012-08-22 LAB — HM MAMMOGRAPHY

## 2012-09-05 HISTORY — PX: CATARACT EXTRACTION: SUR2

## 2012-09-15 ENCOUNTER — Telehealth: Payer: Self-pay | Admitting: Orthopedic Surgery

## 2012-09-15 NOTE — Telephone Encounter (Signed)
LMTCB for pt about F/U appt. 09-15-12 1300 aa

## 2012-09-28 ENCOUNTER — Ambulatory Visit (INDEPENDENT_AMBULATORY_CARE_PROVIDER_SITE_OTHER): Payer: MEDICARE | Admitting: Obstetrics and Gynecology

## 2012-09-28 ENCOUNTER — Encounter: Payer: Self-pay | Admitting: Obstetrics and Gynecology

## 2012-09-28 VITALS — BP 118/58 | Ht 64.0 in | Wt 126.0 lb

## 2012-09-28 DIAGNOSIS — B373 Candidiasis of vulva and vagina: Secondary | ICD-10-CM

## 2012-09-28 DIAGNOSIS — N63 Unspecified lump in unspecified breast: Secondary | ICD-10-CM

## 2012-09-28 DIAGNOSIS — N6310 Unspecified lump in the right breast, unspecified quadrant: Secondary | ICD-10-CM

## 2012-09-28 MED ORDER — FLUCONAZOLE 150 MG PO TABS: 150.0000 mg | ORAL_TABLET | Freq: Once | ORAL | Status: DC

## 2012-09-28 NOTE — Progress Notes (Signed)
Patient ID: [name], female   DOB: Sep 18, 1955, 57 y.o.   MRN: [id number]  SUBJECTIVE  Patient presents for breast recheck and for vaginal itching.  Denies discharge.  Patient using prednisone due to cataract surgery and using hydrocortisone cream on head for a skin lesion.  Vaginal and vulvar itching occurred after this.  Used Monistat 7, Diflucan 150 mg once, and nystatin/triamcinolone cream.  Symptoms are much improved but not completely normal.  Patient is diabetic and blood sugar levels have been elevated due to steroid treatment.  Does have a history of yeast infections.  Patient seen in office on 07/2712 for a right breast lump.  Had a normal mammogram in January 2014.  Not on any hormone therapy.  Has a history of multiple benign breast biopsies.  Family history of breast cancer in mother, maternal grandmother, maternal great aunts, and maternal cousin.    Patient states the breast lump is still there and that it itches.   Wants to proceed with general surgery evaluation.   OBJECTIVE  Breast exam  Bilateral breasts with scattered scars consistent with prior biopsies.  Right breast with linear nodularity from 8  To 10 o'clock position medial to previous scar.  Slight retraction.  No nipple discharge or axillary adenopathy.  Left breast without dominant masses, skin retractions, or nipple discharge.    Pelvic exam -  Atrophic external genitalia.  Normal urethra.  Cervix and vagina no lesions.  Very minimal white physiologic type discharge.  No odor.  Small and nontender uterus.  No adnexal masses or tenderness.   Wet prep - pH 5.5.  No clue cells.  Rare hyphae.  No trichomonas.  08/22/12 - right breast ultrasound at [facility] - moderately heterogeneously echogenicity noted in the region of the questionable palpable abnormailty.  09/11/12 - breast specific gamma imaging at [facility] - benign finding of the right breast.    ASSESSMENT  Right breast mass palpable.  Negative studies including  recent screening mammogram just prior to diagnosis of lump, negative ultrasound, and negative breast specific gamma imaging.  Strong family history of breast cancer.  Yeast vulvovaginitis - partially treated.  PLAN  Proceed with referral to Dr. Cyndia Bent, general surgeon.  Appointment made.  Diflucan ordered.  See EPIC orders.

## 2012-09-28 NOTE — Patient Instructions (Addendum)
Monilial Vaginitis Vaginitis in a soreness, swelling and redness (inflammation) of the vagina and vulva. Monilial vaginitis is not a sexually transmitted infection. CAUSES  Yeast vaginitis is caused by yeast (candida) that is normally found in your vagina. With a yeast infection, the candida has overgrown in number to a point that upsets the chemical balance. SYMPTOMS   White, thick vaginal discharge.  Swelling, itching, redness and irritation of the vagina and possibly the lips of the vagina (vulva).  Burning or painful urination.  Painful intercourse. DIAGNOSIS  Things that may contribute to monilial vaginitis are:  Postmenopausal and virginal states.  Pregnancy.  Infections.  Being tired, sick or stressed, especially if you had monilial vaginitis in the past.  Diabetes. Good control will help lower the chance.  Birth control pills.  Tight fitting garments.  Using bubble bath, feminine sprays, douches or deodorant tampons.  Taking certain medications that kill germs (antibiotics).  Sporadic recurrence can occur if you become ill. TREATMENT  Your caregiver will give you medication.  There are several kinds of anti monilial vaginal creams and suppositories specific for monilial vaginitis. For recurrent yeast infections, use a suppository or cream in the vagina 2 times a week, or as directed.  Anti-monilial or steroid cream for the itching or irritation of the vulva may also be used. Get your caregiver's permission.  Painting the vagina with methylene blue solution may help if the monilial cream does not work.  Eating yogurt may help prevent monilial vaginitis. HOME CARE INSTRUCTIONS   Finish all medication as prescribed.  Do not have sex until treatment is completed or after your caregiver tells you it is okay.  Take warm sitz baths.  Do not douche.  Do not use tampons, especially scented ones.  Wear cotton underwear.  Avoid tight pants and panty  hose.  Tell your sexual partner that you have a yeast infection. They should go to their caregiver if they have symptoms such as mild rash or itching.  Your sexual partner should be treated as well if your infection is difficult to eliminate.  Practice safer sex. Use condoms.  Some vaginal medications cause latex condoms to fail. Vaginal medications that harm condoms are:  Cleocin cream.  Butoconazole (Femstat).  Terconazole (Terazol) vaginal suppository.  Miconazole (Monistat) (may be purchased over the counter). SEEK MEDICAL CARE IF:   You have a temperature by mouth above 102 F (38.9 C).  The infection is getting worse after 2 days of treatment.  The infection is not getting better after 3 days of treatment.  You develop blisters in or around your vagina.  You develop vaginal bleeding, and it is not your menstrual period.  You have pain when you urinate.  You develop intestinal problems.  You have pain with sexual intercourse. Document Released: 03/17/2005 Document Revised: 08/30/2011 Document Reviewed: 11/29/2008 Exit[employer] Patient Information 2013 [employer].  

## 2012-10-09 ENCOUNTER — Encounter: Payer: Self-pay | Admitting: Orthopedic Surgery

## 2012-10-09 ENCOUNTER — Telehealth: Payer: Self-pay | Admitting: Obstetrics and Gynecology

## 2012-10-09 NOTE — Telephone Encounter (Signed)
Spoke with pt who reports she has a labial cyst that Dr Edward Jolly is aware of. She was told to try to express any fluid that might be in cyst. Pt did so, and says it has gotten worse and is painful now. Dr Edward Jolly told her it might have to be removed. Sched appt with BS for Friday 10-13-12 at 1245.

## 2012-10-09 NOTE — Telephone Encounter (Signed)
Please have patient come in sooner.  I want to see if it is infected.

## 2012-10-10 NOTE — Telephone Encounter (Signed)
Patient appointment has been moved to 10-11-12.  Call to patient for status update.  She says that area is no better but no worse.  Instructed to call back if worsens and will work her in.

## 2012-10-11 ENCOUNTER — Other Ambulatory Visit: Payer: Self-pay | Admitting: Obstetrics and Gynecology

## 2012-10-11 ENCOUNTER — Encounter (INDEPENDENT_AMBULATORY_CARE_PROVIDER_SITE_OTHER): Payer: Self-pay

## 2012-10-11 ENCOUNTER — Encounter (INDEPENDENT_AMBULATORY_CARE_PROVIDER_SITE_OTHER): Payer: Self-pay | Admitting: Surgery

## 2012-10-11 ENCOUNTER — Ambulatory Visit (INDEPENDENT_AMBULATORY_CARE_PROVIDER_SITE_OTHER): Payer: BC Managed Care – HMO | Admitting: Surgery

## 2012-10-11 ENCOUNTER — Ambulatory Visit (INDEPENDENT_AMBULATORY_CARE_PROVIDER_SITE_OTHER): Payer: MEDICARE | Admitting: Obstetrics and Gynecology

## 2012-10-11 ENCOUNTER — Encounter: Payer: Self-pay | Admitting: Obstetrics and Gynecology

## 2012-10-11 VITALS — BP 130/70 | Wt 124.0 lb

## 2012-10-11 VITALS — BP 100/70 | HR 104 | Resp 16 | Ht 64.0 in | Wt 123.0 lb

## 2012-10-11 DIAGNOSIS — L723 Sebaceous cyst: Secondary | ICD-10-CM

## 2012-10-11 DIAGNOSIS — L720 Epidermal cyst: Secondary | ICD-10-CM

## 2012-10-11 DIAGNOSIS — N6011 Diffuse cystic mastopathy of right breast: Secondary | ICD-10-CM

## 2012-10-11 DIAGNOSIS — N6019 Diffuse cystic mastopathy of unspecified breast: Secondary | ICD-10-CM | POA: Insufficient documentation

## 2012-10-11 DIAGNOSIS — N764 Abscess of vulva: Secondary | ICD-10-CM

## 2012-10-11 MED ORDER — SULFAMETHOXAZOLE-TRIMETHOPRIM 800-160 MG PO TABS: 1.0000 | ORAL_TABLET | Freq: Two times a day (BID) | ORAL | Status: DC

## 2012-10-11 NOTE — Patient Instructions (Signed)
I think the area in your right breast some benign fibrocystic changes. When you see your gynecologist in July, if there have been any changes we will see you again here.

## 2012-10-11 NOTE — Progress Notes (Signed)
Patient ID: Becky [name], female   DOB: 04-04-1956, 57 y.o.   MRN: [id number]  SUBJECTIVE  Patient presents with painful vulva.  Had a left labial cyst, probably sebaceous which she expressed and noted a tablespoon of sebum type material.  No further drainage, but are became painful.  Denies fever.  Blood sugars are normal.  Has an appointment with Dr. Jamey Ripa this afternoon for right breast lump evaluation.  OBJECTIVE  Inferior left labia majora with 1.5 cm painful firm, mobile mass (lateral to Bartholin's gland area).  Erythema of skin overlying area.  Small 3 - 4 mm skin tag overlying the area.  Procedure - I and D of abscess and cyst wall biopsy  Verbal consent.  Sterile prep with betadine.  Local 1 % lidocaine locally to area.  Scalpel used to open the skin over 1 cm.  Small amount of pus noted.  Culture taken.  Biopsy of cyst wall taken and sent to pathology.  No significant bleeding.  No complications.  ASSESSMENT  Vulvar abscess. Epidermal cyst Right breast mass - known.  PLAN  Start Bactrim DS 1 po bid for 7 days. Follow up culture. Follow up biopsy. If patient desires removal, I anticipate this to be done in the operating room. Patient will see Dr. Jamey Ripa this afternoon to discuss excision of the breast mass.

## 2012-10-11 NOTE — Progress Notes (Signed)
NAME: Becky [name]       DOB: [birth year]           DATE: 10/11/2012       RUE:[id number]  CC:  Chief Complaint  Patient presents with  . Breast Mass    HPI: the patient comes in for evaluation of a possible right breast mass. About 6 months ago she noted some itching in the upper-outer half of the right breast. Starting in March, about a month ago, the issue is really localized between the right areolar edge and a scar in the right 9:00 position. There has been a question of 2 BB sized nodules in the area. She had a mammogram in January that was negative. She now has had a recent ultrasound had a recent [facility] GI scan, both of which are negative. Of note is that she has had approximately 6 biopsies. 4 of them were done in 2000 and all were benign fibrocystic change with one of them also showing small fibroadenoma. She also had a very hard area in the upper-outer quadrant of both breasts more noticeable on the right than the left but this is been very stable. She also notes that the right breast is smaller than the left but most of her breast biopsies has been on the right side and she attributes to surgery to the size differential between the right and left breast. She also has a family history of breast cancer.  Past Medical History  Diagnosis Date  . IDDM (insulin dependent diabetes mellitus) 1969  . RA (rheumatoid arthritis) 2000  . Migraine headache   . Abnormal ECG   . Hypercholesterolemia   . Sjoegren syndrome   . Otosclerosis   . S/P cardiac cath August 2013    Normal coronaries and normal LV function.  . Neuropathy    .family history includes Breast cancer in her maternal grandmother, mother, and paternal aunt.   EXAM: Vital signs: BP 100/70  Pulse 104  Resp 16  Ht 5\' 4"  (1.626 m)  Wt 123 lb (55.792 kg)  BMI 21.1 kg/m2  LMP 09/19/2008  General: Patient alert, oriented, NAD  Breasts: Right breast is somewhat smaller the left but they are similar in shape. She has multiple  scars in both breasts. There is a very hard area involving most of the upper-outer quadrant of the right breast and a somewhat similar feeling area in the upper-outer quadrant of left breast. The area in question between the areolar margin and the scar on the right breast feels like some benign fibrocystic change to me. I don't really appreciate a dominant mass.  Data reviewed: I reviewed the recent mammogram and [facility] and ultrasound reports. I have reviewed her epic notes, and our old records including operative notes and pathology reports from her breast surgery IMP: I think the area that she came to see me about it represents a benign fibrocystic change. I think the hard area that we get his bilaterally represents some diabetic mastopathy  PLAN: at this point did not recommend surgical excision given that her ultrasound and [facility] and mammogram are all negative. In addition, her physical examination is not worrisome. I did recommend it be reexamined in 3 months in short he has an appointment with her gynecologist scheduled in July. If there's been any change noted at that time I will see her again.  Becky [name] J 10/11/2012

## 2012-10-11 NOTE — Patient Instructions (Signed)
Vulvar Abscess  The vulva is made up of the large and small flaps of skin around the vagina opening. A vulvar abscess is an infected sac of yellowish white fluid (pus) in the skin flaps. Your doctor may make a small cut in the skin to drain the vulvar abscess.  HOME CARE  Only take medicine as told by your doctor.  Soak or take a warm water bath (sitz bath) 3 to 4 times a day for 15 to 20 minutes.  After you pee (urinate), always wipe from front to back.  Clean the vulvar abscess with soap and warm water. Do this after going to the bathroom.  Wear loose-fitting clothing.  Do not have sex until the vulvar abscess is gone or as told by your doctor. GET HELP RIGHT AWAY IF:   You have a temperature by mouth above 102 F (38.9 C).  The vulva area becomes more painful, puffy (swollen), or red.  You have fluid coming from the vulva area that is red or tan.  You have pain when you pee or have a hard time peeing. MAKE SURE YOU:  Understand these instructions.  Will watch your condition.  Will get help if you are not doing well or get worse. Document Released: 09/03/2008 Document Revised: 08/30/2011 Document Reviewed: 09/03/2008 [employer] Patient Information 2013 [employer].  

## 2012-10-13 ENCOUNTER — Ambulatory Visit: Payer: MEDICARE | Admitting: Obstetrics and Gynecology

## 2012-10-13 LAB — IPS CERVICAL/ECC/EMB/VULVAR/VAGINAL BIOPSY

## 2012-10-14 LAB — WOUND CULTURE: Gram Stain: NONE SEEN

## 2012-10-16 ENCOUNTER — Telehealth: Payer: Self-pay

## 2012-10-16 NOTE — Telephone Encounter (Signed)
Message copied by Alphonsa Overall on Mon Oct 16, 2012  2:15 PM ------      Message from: Conley Simmonds      Created: Sun Oct 15, 2012  7:06 AM       Please report to the patient that the Bactrim DS is appropriate treatment for the bacteria found in the abscess I drained for her.            Thank you,            Conley Simmonds ------

## 2012-10-16 NOTE — Telephone Encounter (Signed)
lmovm to call for results.

## 2012-10-16 NOTE — Telephone Encounter (Signed)
Patient notified she is on correct antibiotic (Bactrim DS) per [facility].

## 2012-10-19 ENCOUNTER — Encounter (INDEPENDENT_AMBULATORY_CARE_PROVIDER_SITE_OTHER): Payer: Self-pay

## 2012-10-20 ENCOUNTER — Encounter: Payer: Self-pay | Admitting: Obstetrics and Gynecology

## 2012-10-20 ENCOUNTER — Ambulatory Visit (INDEPENDENT_AMBULATORY_CARE_PROVIDER_SITE_OTHER): Payer: MEDICARE | Admitting: Obstetrics and Gynecology

## 2012-10-20 VITALS — BP 100/54

## 2012-10-20 DIAGNOSIS — L723 Sebaceous cyst: Secondary | ICD-10-CM

## 2012-10-20 DIAGNOSIS — L089 Local infection of the skin and subcutaneous tissue, unspecified: Secondary | ICD-10-CM

## 2012-10-20 NOTE — Progress Notes (Signed)
Patient ID: Becky [name], female   DOB: 25-Mar-1956, 57 y.o.   MRN: [id number]  Subjective  Patient is here for a vulvar recheck.  Patient had a left vulvar abscess drained on 10/11/12.  Feels much better.  Did have a fever the night of 4/23 to 101 degrees F, which resolved quickly.    Patient states that her blood sugar had been elevated recently due to steroid drops of the right eye due to swelling following cataract surgery.  Last HgbA1C - 7.3.     Saw Dr. Gaston Islam on 10/11/12 who believes the lump on the patient's right breast is benign diabetic mastopathy.  He did not recommend surgery but recommends a recheck in July through this office.    Patient wants removal of cyst of the left vulva.    Going on two trips.  Going to a horse show and then to [place], [place].   Objective  External genitalia  Left vulva with 1.5 cm nontender mass of the left labia majora.  Has nevus overlying this which is 4 mm, slightly raised, and grayish in color.    Abscess culture - Positive for Morganella Morganii, sensitive to Bactrim.  Biopsy of cyst wall - benign squamous mucosa with subepidermal squamous cyst.  Acute inflammation and abscess noted.  No atypia or malignancy.  Assessment  Vulvar abscess resolved. Left vulvar sebaceous cyst. Diabetes mellitus. Benign right breast lump.  Plan  Patient wishes to proceed with excision of the vulvar sebaceous cyst which I will do at [facility].  Will precert procedure.  I have discussed with the patient the risks, benefits, and alternatives including observation only. Patient has an appointment for her annual exam in July 2014.

## 2012-10-20 NOTE — Patient Instructions (Signed)

## 2012-11-02 ENCOUNTER — Telehealth: Payer: Self-pay | Admitting: Obstetrics and Gynecology

## 2012-11-02 NOTE — Telephone Encounter (Signed)
LMTCB regarding insurance benefit information for upcoming surgery.

## 2012-11-06 ENCOUNTER — Telehealth: Payer: Self-pay | Admitting: *Deleted

## 2012-11-06 NOTE — Telephone Encounter (Signed)
Call to patient to check on surgical date preference. LMTCB.

## 2012-11-14 ENCOUNTER — Telehealth: Payer: Self-pay | Admitting: Obstetrics and Gynecology

## 2012-11-14 NOTE — Telephone Encounter (Signed)
Spoke to patient about surgery cost. She decided she wanted to hold off on the surgery for awhile since her insurance company did not cover as much as she expected.

## 2012-11-14 NOTE — Telephone Encounter (Signed)
Patient spoke to [facility] in ins and has decided to hold off on scheduling for now.  Any further follow up needed?

## 2012-11-14 NOTE — Telephone Encounter (Signed)
Pt returning sally's call

## 2012-11-14 NOTE — Telephone Encounter (Signed)
LMTCB, phone number confirmed on VM, LM calling to check date requests.

## 2012-11-15 NOTE — Telephone Encounter (Signed)
No follow up needed.  This is a benign sebaceous cyst of the vulva.

## 2012-11-22 ENCOUNTER — Encounter: Payer: Self-pay | Admitting: Endocrinology

## 2012-11-22 ENCOUNTER — Encounter (INDEPENDENT_AMBULATORY_CARE_PROVIDER_SITE_OTHER): Payer: Medicare Other | Admitting: *Deleted

## 2012-11-22 DIAGNOSIS — I739 Peripheral vascular disease, unspecified: Secondary | ICD-10-CM

## 2013-01-03 ENCOUNTER — Ambulatory Visit: Payer: MEDICARE | Admitting: Obstetrics and Gynecology

## 2013-01-03 ENCOUNTER — Ambulatory Visit: Payer: Self-pay | Admitting: Nurse Practitioner

## 2013-07-13 ENCOUNTER — Ambulatory Visit: Payer: MEDICARE | Admitting: Obstetrics and Gynecology

## 2013-08-01 ENCOUNTER — Telehealth: Payer: Self-pay | Admitting: Obstetrics and Gynecology

## 2013-08-01 NOTE — Telephone Encounter (Signed)
Spoke with pt who first noticed discomfort and discharge about 2 weeks ago. Pt thought it would go away but it has gotten steadily worse. Denies fever. Discharge is a "dirty green" color and comes and goes. Lower abdominal pain also comes and goes. Sched OV tomorrow with BS at 3:30.

## 2013-08-01 NOTE — Telephone Encounter (Signed)
Patient said she is having pain in her lower stomach and discharge. Wants to come in and see silva or patty.

## 2013-08-02 ENCOUNTER — Encounter: Payer: Self-pay | Admitting: Obstetrics and Gynecology

## 2013-08-02 ENCOUNTER — Ambulatory Visit (INDEPENDENT_AMBULATORY_CARE_PROVIDER_SITE_OTHER): Payer: MEDICARE | Admitting: Obstetrics and Gynecology

## 2013-08-02 VITALS — BP 124/66 | HR 89 | Resp 16 | Wt 129.0 lb

## 2013-08-02 DIAGNOSIS — N952 Postmenopausal atrophic vaginitis: Secondary | ICD-10-CM

## 2013-08-02 DIAGNOSIS — R109 Unspecified abdominal pain: Secondary | ICD-10-CM

## 2013-08-02 LAB — POCT URINALYSIS DIPSTICK
Bilirubin, UA: NEGATIVE
Blood, UA: NEGATIVE
Glucose, UA: NEGATIVE
Ketones, UA: NEGATIVE
Leukocytes, UA: NEGATIVE
Nitrite, UA: NEGATIVE
Protein, UA: NEGATIVE
Urobilinogen, UA: NEGATIVE
pH, UA: 5

## 2013-08-02 MED ORDER — ESTRADIOL 10 MCG VA TABS: 1.0000 | ORAL_TABLET | VAGINAL | Status: DC

## 2013-08-02 NOTE — Progress Notes (Signed)
Patient ID: Becky [name], female   DOB: 01/29/1956, 58 y.o.   MRN: [id number]  GYNECOLOGY PROBLEM VISIT  PCP:   Referring provider:   HPI: 58 y.o.   Married  Caucasian  female   G1P1001 with Patient's last menstrual period was 09/19/2008.   here for  Lower abdominal midline nd discharge. Patient worried about ovarian cancer.  Both are coming and going. Lower back also uncomfortable.  Feels like she did too many sit ups. Groin sore.  Nothing makes it better or worse. No pain medication use.  Always has constipation.  Takes Miralax for this.  Takes Activa twice daily also.  Last bowel movement was this am.  This did not relieve the discomfort.  No pain or discomfort with urination.   Some greenish discharge.   No itching.  Odor comes and goes.  Used OTC monistat and had no improvement.    Has Sjogrens.  Broke two bones in the left foot. Now on Forteo for 4 months.  - Dr. Evlyn Kanner prescribing.   Used a bone stimulator to help with the healing.   Going to [place] next week.    Has an appointment for an annual exam in 2 weeks.   GNECOLOGIC HISTORY: Patient's last menstrual period was 09/19/2008. Sexually active:  yes Partner preference: female Contraception:   NA Menopausal hormone therapy:   none Mammogram:    Breast [facility] 09/11/12 - normal.             OB History   Grav Para Term Preterm Abortions TAB SAB Ect Mult Living   1 1 1       1          Family History  Problem Relation Age of Onset  . Breast cancer Mother   . Breast cancer Paternal Aunt   . Breast cancer Maternal Grandmother     Patient Active Problem List   Diagnosis Date Noted  . Fibrocystic breast disease 10/11/2012  . GERD 10/01/2009  . Chest pain with moderate risk for cardiac etiology 09/02/2009  . DYSPHAGIA 09/02/2009  . DIABETES MELLITUS-TYPE II 08/15/2009  . ANXIETY 08/15/2009  . ULCER-GASTRIC 08/15/2009  . CONSTIPATION 08/15/2009  . SLOW TRANSIT CONSTIPATION 08/15/2009   . FLATULENCE ERUCTATION AND GAS PAIN 08/15/2009    Past Medical History  Diagnosis Date  . IDDM (insulin dependent diabetes mellitus) 1969  . RA (rheumatoid arthritis) 2000  . Migraine headache   . Abnormal ECG   . Hypercholesterolemia   . Sjoegren syndrome   . Otosclerosis   . S/P cardiac cath August 2013    Normal coronaries and normal LV function.  . Neuropathy   . Osteoporosis     10/2012    Past Surgical History  Procedure Laterality Date  . Breast lumpectomy  1995  . Carpal tunnel release  2000  . Toe surgery  2000    bone removed  . Cesarean section  1987  . Lymph node dissection  2002  . Hysteroscopy  02/2001    and D & C  . Cataract extraction Right 09-05-12    ALLERGIES: Ciprofloxacin and Penicillins  Current Outpatient Prescriptions  Medication Sig Dispense Refill  . aspirin 81 MG tablet Take 81 mg by mouth daily.      10-04-1977 azithromycin (AZASITE) 1 % ophthalmic solution 1 drop once daily for a week then off a week (rotating)      . calcium carbonate (OS-CAL) 600 MG TABS Take 600 mg by mouth daily.      Marland Kitchen  cycloSPORINE (RESTASIS) 0.05 % ophthalmic emulsion Place 1 drop into both eyes 2 (two) times daily.      . DULoxetine (CYMBALTA) 60 MG capsule Take 60 mg by mouth daily.      . furosemide (LASIX) 20 MG tablet Take 20 mg by mouth every other day.      . insulin aspart (NOVOLOG) 100 UNIT/ML injection Insulin pump      . lansoprazole (PREVACID) 15 MG capsule Take 15 mg by mouth daily.      Marland Kitchen leflunomide (ARAVA) 20 MG tablet Take 20 mg by mouth daily.      Marland Kitchen losartan (COZAAR) 25 MG tablet Take 25 mg by mouth daily.      . methotrexate 1 G injection Inject into the vein. 25mg  injected weekly 0.6 ml      . Multiple Vitamin (MULTIVITAMIN) tablet Take 1 tablet by mouth daily.      POLYETHYLENE GLYCOL 3350 PO Take by mouth daily.      . pravastatin (PRAVACHOL) 20 MG tablet Take 20 mg by mouth daily.      . SUMAtriptan (IMITREX) 100 MG tablet Take 100 mg by mouth  every 2 (two) hours as needed.      . Teriparatide, Recombinant, (FORTEO [place]) Inject 20 mcg into the skin daily.      Marland Kitchen topiramate (TOPAMAX) 200 MG tablet Take 200 mg by mouth daily.      . vitamin B-12 (CYANOCOBALAMIN) 1000 MCG tablet Take 1,000 mcg by mouth daily.      . Vitamin D, Ergocalciferol, (DRISDOL) 50000 UNITS CAPS Take 50,000 Units by mouth every 7 (seven) days.      . fluconazole (DIFLUCAN) 150 MG tablet Take 1 tablet (150 mg total) by mouth once. Take one tablet.  Repeat in 48 hours if symptoms are not completely resolved.  2 tablet  0  . folic acid (FOLVITE) 1 MG tablet Take 1 mg by mouth daily.      . traMADol (ULTRAM) 50 MG tablet Take 50 mg by mouth as needed.       No current facility-administered medications for this visit.     ROS:  Pertinent items are noted in HPI.  SOCIAL HISTORY:  Married.   PHYSICAL EXAMINATION:    BP 124/66  Pulse 89  Resp 16  Wt 129 lb (58.514 kg)  LMP 09/19/2008   Wt Readings from Last 3 Encounters:  08/02/13 129 lb (58.514 kg)  10/11/12 123 lb (55.792 kg)  10/11/12 124 lb (56.246 kg)     Ht Readings from Last 3 Encounters:  10/11/12 5\' 4"  (1.626 m)  09/28/12 5\' 4"  (1.626 m)  02/07/12 5\' 4"  (1.626 m)    General appearance: alert, cooperative and appears stated age Abdomen: Insulin pump noted in the left lower abdomen, soft, non-tender; no masses,  no organomegaly Extremities: extremities normal, atraumatic, no cyanosis or edema Skin: Skin color, texture, turgor normal. No rashes or lesions Lymph nodes: Cervical, supraclavicular, and axillary nodes normal. No abnormal inguinal nodes palpated Neurologic: Grossly normal  Pelvic: External genitalia:  no lesions              Urethra:  normal appearing urethra with no masses, tenderness or lesions              Bartholins and Skenes: normal                 Vagina: normal appearing vagina with erythema and greenish discharge.  Cervix: normal appearance                      Bimanual Exam:  Uterus:  uterus is normal size, shape, consistency and nontender                                      Adnexa: normal adnexa in size, nontender and no masses                                      Rectovaginal: Confirms                                      Anus:  normal sphincter tone, no lesions  Wet prep - pH 5.5, negative clue cells, negative yeast, negative trichomonas.   ASSESSMENT  Atrophic vaginitis.  Lower abdominal discomfort.  I suspect constipation. On Forteo for osteoporosis.  Family history of breast cancer.  PLAN  I discussed a short course of vaginal estrogen treatment to help atrophic symptoms. Vagifem 10 mcg - at hs for two weeks and then twice weekly.  This may be a short treatment for the patient.  I discussed risks and benefits of treatment.  MI, stroke, DVT, PE, breast cancer.   Medications per Epic orders Reassess in two weeks at annual examination.  Increase water intake and fiber in diet.  If lower abdominal discomfort persists, do pelvic ultrasound.  Patient declines pursuing this at this time.   25 minutes face to face time of which over 50% was spent in counseling.   An After Visit Summary was printed and given to the patient.

## 2013-08-02 NOTE — Patient Instructions (Signed)
Atrophic Vaginitis Atrophic vaginitis is a problem of low levels of estrogen in women. This problem can happen at any age. It is most common in women who have gone through menopause ("the change").  HOW WILL I KNOW IF I HAVE THIS PROBLEM? You may have:  Trouble with peeing (urinating), such as:  Going to the bathroom often.  A hard time holding your pee until you reach a bathroom.  Leaking pee.  Having pain when you pee.  Itching or a burning feeling.  Vaginal bleeding and spotting.  Pain during sex.  Dryness of the vagina.  A yellow, bad-smelling fluid (discharge) coming from the vagina. HOW WILL MY DOCTOR CHECK FOR THIS PROBLEM?  During your exam, your doctor will likely find the problem.  If there is a vaginal fluid, it may be checked for infection. HOW WILL THIS PROBLEM BE TREATED? Keep the vulvar skin as clean as possible. Moisturizers and lubricants can help with some of the symptoms. Estrogen replacement can help. There are 2 ways to take estrogen:  Systemic estrogen gets estrogen to your whole body. It takes many weeks or months before the symptoms get better.  You take an estrogen pill.  You use a skin patch. This is a patch that you put on your skin.  If you still have your uterus, your doctor may ask you to take a hormone. Talk to your doctor about the right medicine for you.  Estrogen cream.  This puts estrogen only at the part of your body where you apply it. The cream is put into the vagina or put on the vulvar skin. For some women, estrogen cream works faster than pills or the patch. CAN ALL WOMEN WITH THIS PROBLEM USE ESTROGEN? No. Women with certain types of cancer, liver problems, or problems with blood clots should not take estrogen. Your doctor can help you decide the best treatment for your symptoms. Document Released: 11/24/2007 Document Revised: 08/30/2011 Document Reviewed: 11/24/2007 [facility] Patient Information 2014 [place], [place].  Estradiol  vaginal tablets What is this medicine? ESTRADIOL (es tra DYE ole) vaginal tablet is used to help relieve symptoms of vaginal irritation and dryness that occurs in some women during menopause. This medicine may be used for other purposes; ask your health care provider or pharmacist if you have questions. COMMON BRAND NAME(S): Vagifem What should I tell my health care provider before I take this medicine? They need to know if you have any of these conditions: -abnormal vaginal bleeding -blood vessel disease or blood clots -breast, cervical, endometrial, ovarian, liver, or uterine cancer -dementia -diabetes -gallbladder disease -heart disease or recent heart attack -high blood pressure -high cholesterol -high level of calcium in the blood -hysterectomy -kidney disease -liver disease -migraine headaches -protein C deficiency -protein S deficiency -stroke -systemic lupus erythematosus (SLE) -tobacco smoker -an unusual or allergic reaction to estrogens, other hormones, medicines, foods, dyes, or preservatives -pregnant or trying to get pregnant -breast-feeding How should I use this medicine? This medicine is only for use in the vagina. Do not take by mouth. Wash your hands before and after use. Read package directions carefully. Unwrap the pre-filled applicator package. Lie on your back, part and bend your knees. Gently insert the applicator tip high in the vagina and push the plunger to release the tablet into the vagina. Gently remove the applicator. Throw away the applicator after use. Do not use your medicine more often than directed. Finish the full course prescribed by your doctor or health care professional even  if you think your condition is better. Do not stop using except on the advice of your doctor or health care professional. Talk to your pediatrician regarding the use of this medicine in children. A patient package insert for the product will be given with each prescription  and refill. Read this sheet carefully each time. The sheet may change frequently. Overdosage: If you think you have taken too much of this medicine contact a poison control center or emergency room at once. NOTE: This medicine is only for you. Do not share this medicine with others. What if I miss a dose? If you miss a dose, take it as soon as you can. If it is almost time for your next dose, take only that dose. Do not take double or extra doses. What may interact with this medicine? Do not take this medicine with any of the following medications: -aromatase inhibitors like aminoglutethimide, anastrozole, exemestane, letrozole, testolactone This medicine may also interact with the following medications: -antibiotics used to treat tuberculosis like rifabutin, rifampin and rifapentene -raloxifene or tamoxifen -warfarin This list may not describe all possible interactions. Give your health care provider a list of all the medicines, herbs, non-prescription drugs, or dietary supplements you use. Also tell them if you smoke, drink alcohol, or use illegal drugs. Some items may interact with your medicine. What should I watch for while using this medicine? Visit your health care professional for regular checks on your progress. You will need a regular breast and pelvic exam. You should also discuss the need for regular mammograms with your health care professional, and follow his or her guidelines. This medicine can make your body retain fluid, making your fingers, hands, or ankles swell. Your blood pressure can go up. Contact your doctor or health care professional if you feel you are retaining fluid. If you have any reason to think you are pregnant; stop taking this medicine at once and contact your doctor or health care professional. Tobacco smoking increases the risk of getting a blood clot or having a stroke, especially if you are more than 57 years old. You are strongly advised not to smoke. If you  wear contact lenses and notice visual changes, or if the lenses begin to feel uncomfortable, consult your eye care specialist. If you are going to have elective surgery, you may need to stop taking this medicine beforehand. Consult your health care professional for advice prior to scheduling the surgery. What side effects may I notice from receiving this medicine? Side effects that you should report to your doctor or health care professional as soon as possible: -allergic reactions like skin rash, itching or hives, swelling of the face, lips, or tongue -breast tissue changes or discharge -changes in vision -chest pain -confusion, trouble speaking or understanding -dark urine -general ill feeling or flu-like symptoms -light-colored stools -nausea, vomiting -pain, swelling, warmth in the leg -right upper belly pain -severe headaches -shortness of breath -sudden numbness or weakness of the face, arm or leg -trouble walking, dizziness, loss of balance or coordination -unusual vaginal bleeding -yellowing of the eyes or skin Side effects that usually do not require medical attention (report to your doctor or health care professional if they continue or are bothersome): -hair loss -increased hunger or thirst -increased urination -symptoms of vaginal infection like itching, irritation or unusual discharge -unusually weak or tired This list may not describe all possible side effects. Call your doctor for medical advice about side effects. You may report side effects to FDA  at 1-800-FDA-1088. Where should I keep my medicine? Keep out of the reach of children. Store at room temperature between 15 and 30 degrees C (59 and 86 degrees F). Throw away any unused medicine after the expiration date. NOTE: This sheet is a summary. It may not cover all possible information. If you have questions about this medicine, talk to your doctor, pharmacist, or health care provider.  2014, Elsevier/Gold Standard.  (2010-09-09 09:08:58)

## 2013-08-17 ENCOUNTER — Encounter: Payer: Self-pay | Admitting: Obstetrics and Gynecology

## 2013-08-17 ENCOUNTER — Ambulatory Visit: Payer: MEDICARE | Admitting: Obstetrics and Gynecology

## 2013-08-19 HISTORY — PX: CATARACT EXTRACTION: SUR2

## 2013-09-19 ENCOUNTER — Encounter: Payer: Self-pay | Admitting: Obstetrics and Gynecology

## 2013-09-19 ENCOUNTER — Ambulatory Visit (INDEPENDENT_AMBULATORY_CARE_PROVIDER_SITE_OTHER): Payer: Medicare Other | Admitting: Obstetrics and Gynecology

## 2013-09-19 VITALS — BP 120/58 | HR 88 | Temp 98.8°F | Resp 16 | Ht 64.0 in | Wt 128.0 lb

## 2013-09-19 DIAGNOSIS — Z01419 Encounter for gynecological examination (general) (routine) without abnormal findings: Secondary | ICD-10-CM

## 2013-09-19 DIAGNOSIS — Z803 Family history of malignant neoplasm of breast: Secondary | ICD-10-CM

## 2013-09-19 NOTE — Patient Instructions (Signed)

## 2013-09-19 NOTE — Progress Notes (Signed)
Patient ID: [name], female   DOB: [birth year], 57 y.o.   MRN: [id number]  GYNECOLOGY VISIT  PCP:   Referring provider:   HPI: 58 y.o.   Married  Caucasian  female   G1P1001 with Patient's last menstrual period was 09/19/2008.   here for annual examination.  Used Vagifem and this helped, but ran out.   Would like to stop due to family history of breast cancer.  Still with some discharge.  No itching or burning or odor.   Has lumpy breasts.  Has difficulty with self breast exams.  Broken foot twice.  On Forteo with Dr. Evlyn Kanner.   Diagnosed with influenza this spring.  Strong family history of breast cancer on mother's side - multiple generations and multiple family mambers.   Hgb:  PCP Urine:  PCP  GYNECOLOGIC HISTORY: Patient's last menstrual period was 09/19/2008. Partner preference: female Contraception:   postmenopausal Menopausal hormone therapy: Vagifem.  Last mammogram:            09/11/12 - [facility] - benign.  Had mammogram 3D yesterday - [facility].  Last pap and high risk HPV testing:   01/03/12 - WNL, neg HR HPV. History of abnormal pap smear:  No prior abnormal pap.    OB History   Grav Para Term Preterm Abortions TAB SAB Ect Mult Living   1 1 1       1        LIFESTYLE: Exercise:    no           Tobacco: no Alcohol: yes, 2 drinks per week.  Drug use:  no  TDap;  2014 - PCP. Bone density: osteoporosis 11/2012 - Dr. 12/2012.  Colonoscopy:  2011  Cholesterol check: PCP.  Family History  Problem Relation Age of Onset  . Breast cancer Mother   . Breast cancer Paternal Aunt   . Breast cancer Maternal Grandmother     Patient Active Problem List   Diagnosis Date Noted  . Fibrocystic breast disease 10/11/2012  . GERD 10/01/2009  . Chest pain with moderate risk for cardiac etiology 09/02/2009  . DYSPHAGIA 09/02/2009  . DIABETES MELLITUS-TYPE II 08/15/2009  . ANXIETY 08/15/2009  . ULCER-GASTRIC 08/15/2009  . CONSTIPATION 08/15/2009  . SLOW TRANSIT  CONSTIPATION 08/15/2009  . FLATULENCE ERUCTATION AND GAS PAIN 08/15/2009   Past Medical History  Diagnosis Date  . IDDM (insulin dependent diabetes mellitus) 1969  . RA (rheumatoid arthritis) 2000  . Migraine headache   . Abnormal ECG   . Hypercholesterolemia   . Sjoegren syndrome   . Otosclerosis   . S/P cardiac cath August 2013    Normal coronaries and normal LV function.  . Neuropathy   . Osteoporosis     10/2012    Past Surgical History  Procedure Laterality Date  . Breast lumpectomy  1995  . Carpal tunnel release  2000  . Toe surgery  2000    bone removed  . Cesarean section  1987  . Lymph node dissection  2002  . Hysteroscopy  02/2001    and D & C  . Cataract extraction Right 09-05-12    ALLERGIES: Ciprofloxacin and Penicillins  Current Outpatient Prescriptions  Medication Sig Dispense Refill  . aspirin 81 MG tablet Take 81 mg by mouth daily.      10-04-1977 azithromycin (AZASITE) 1 % ophthalmic solution 1 drop once daily for a week then off a week (rotating)      . calcium carbonate (OS-CAL) 600 MG TABS Take  600 mg by mouth daily.      . cycloSPORINE (RESTASIS) 0.05 % ophthalmic emulsion Place 1 drop into both eyes 2 (two) times daily.      . DULoxetine (CYMBALTA) 60 MG capsule Take 60 mg by mouth daily.      . Estradiol (VAGIFEM) 10 MCG TABS vaginal tablet Place 1 tablet (10 mcg total) vaginally 2 (two) times a week. Use every night before bed for two weeks when you first begin this medicine, then after the first two weeks, begin using it twice a week.  18 tablet  0  . fluconazole (DIFLUCAN) 150 MG tablet Take 1 tablet (150 mg total) by mouth once. Take one tablet.  Repeat in 48 hours if symptoms are not completely resolved.  2 tablet  0  . folic acid (FOLVITE) 1 MG tablet Take 1 mg by mouth daily.      . furosemide (LASIX) 20 MG tablet Take 20 mg by mouth every other day.      . insulin aspart (NOVOLOG) 100 UNIT/ML injection Insulin pump      . lansoprazole (PREVACID) 15 MG  capsule Take 15 mg by mouth daily.      Marland Kitchen leflunomide (ARAVA) 20 MG tablet Take 20 mg by mouth daily.      Marland Kitchen losartan (COZAAR) 25 MG tablet Take 25 mg by mouth daily.      . methotrexate 1 G injection Inject into the vein. 25mg  injected weekly 0.6 ml      . Multiple Vitamin (MULTIVITAMIN) tablet Take 1 tablet by mouth daily.      Marland Kitchen POLYETHYLENE GLYCOL 3350 PO Take by mouth daily.      . pravastatin (PRAVACHOL) 20 MG tablet Take 20 mg by mouth daily.      . SUMAtriptan (IMITREX) 100 MG tablet Take 100 mg by mouth every 2 (two) hours as needed.      . Teriparatide, Recombinant, (FORTEO [place]) Inject 20 mcg into the skin daily.      Marland Kitchen topiramate (TOPAMAX) 200 MG tablet Take 200 mg by mouth daily.      . traMADol (ULTRAM) 50 MG tablet Take 50 mg by mouth as needed.      . vitamin B-12 (CYANOCOBALAMIN) 1000 MCG tablet Take 1,000 mcg by mouth daily.      . Vitamin D, Ergocalciferol, (DRISDOL) 50000 UNITS CAPS Take 50,000 Units by mouth every 7 (seven) days.       No current facility-administered medications for this visit.     ROS:  Pertinent items are noted in HPI.  SOCIAL HISTORY:    PHYSICAL EXAMINATION:    BP 120/58  Pulse 88  Temp(Src) 98.8 F (37.1 C)  Resp 16  Ht 5\' 4"  (1.626 m)  Wt 128 lb (58.06 kg)  BMI 21.96 kg/m2  LMP 09/19/2008   Wt Readings from Last 3 Encounters:  09/19/13 128 lb (58.06 kg)  08/02/13 129 lb (58.514 kg)  10/11/12 123 lb (55.792 kg)     Ht Readings from Last 3 Encounters:  09/19/13 5\' 4"  (1.626 m)  10/11/12 5\' 4"  (1.626 m)  09/28/12 5\' 4"  (1.626 m)    General appearance: alert, cooperative and appears stated age Head: Normocephalic, without obvious abnormality, atraumatic Neck: no adenopathy, supple, symmetrical, trachea midline and thyroid not enlarged, symmetric, no tenderness/mass/nodules Lungs: clear to auscultation bilaterally Breasts: Inspection negative, No nipple retraction or dimpling, No nipple discharge or bleeding, No axillary or  supraclavicular adenopathy, Normal to palpation without dominant masses.  Multiple  scars of left and right breasts.  Ridge of left breast at 7 o-clock - no change.  Heart: regular rate and rhythm Abdomen: soft, non-tender; no masses,  no organomegaly.  Insulin pump on left abdomen.  Extremities: extremities normal, atraumatic, no cyanosis or edema Skin: Skin color, texture, turgor normal. No rashes or lesions Lymph nodes: Cervical, supraclavicular, and axillary nodes normal. No abnormal inguinal nodes palpated Neurologic: Grossly normal  Pelvic: External genitalia:  no lesions.  3 mm left vulvar skin tag.              Urethra:  normal appearing urethra with no masses, tenderness or lesions              Bartholins and Skenes: normal                 Vagina: normal appearing vagina with normal color and discharge, no lesions physiologic discharge.  No erythema.               Cervix: normal appearance              Pap and high risk HPV testing done: no.            Bimanual Exam:  Uterus:  uterus is normal size, shape, consistency and nontender                                      Adnexa: normal adnexa in size, nontender and no masses                                      Rectovaginal: Confirms                                      Anus:  normal sphincter tone, no lesions.  4 mm right perianal skin tag.   ASSESSMENT  Normal gynecologic exam. Strong family history of breast cancer.  Status post multiple breast biopsies.  Atrophic vaginal changes - resolved with short course of Vagifem. Osteoporosis.  On Forteo.    PLAN  Mammogram recommended yearly.  Will try to do breast MRI due to family history and history of dense breast tissue.  Pap smear and high risk HPV testing not indicated.  Counseled on self breast exam. Stop Vagifem. Will treat vaginal dryness with vitamin E suppositories.  Return annually or prn   An After Visit Summary was printed and given to the patient.

## 2013-09-24 ENCOUNTER — Telehealth: Payer: Self-pay | Admitting: Emergency Medicine

## 2013-09-24 DIAGNOSIS — Z803 Family history of malignant neoplasm of breast: Secondary | ICD-10-CM

## 2013-09-24 DIAGNOSIS — Z01812 Encounter for preprocedural laboratory examination: Secondary | ICD-10-CM

## 2013-09-24 DIAGNOSIS — N6019 Diffuse cystic mastopathy of unspecified breast: Secondary | ICD-10-CM

## 2013-09-24 DIAGNOSIS — R922 Inconclusive mammogram: Secondary | ICD-10-CM

## 2013-09-24 NOTE — Telephone Encounter (Signed)
Spoke with Rosel at [facility] for Nurse Review.   Advised hx of Maternal Grandmother Breast CA             Mother and Paternal Aunt Hx Breast CA  Last Mammogram : 09/18/13 at [facility] Breast Density D.   Needs Peer to Peer Review from MD at [phone]   Dr. Edward Jolly can you let me know what you complete peer to peer and any authorization numbers you receive?  Cc [place]

## 2013-09-27 NOTE — Telephone Encounter (Signed)
Becky [name],  I have called multiple numbers and am stuck in a loop of telephone trees, where no one is taking responsibility to assist.  I have just left a message for someone to call back and speak with you.  I left verbally the reason for the appeal for the breast MRI as patient with history of dense breasts, multiple prior breast biopsies, and family history of multiple members affected by breast cancer.  Thanks.

## 2013-10-16 NOTE — Telephone Encounter (Signed)
Thank you for your assistance and follow through.

## 2013-10-16 NOTE — Telephone Encounter (Signed)
Received prior authorization from [facility] for Bilateral Breast MRI w wo contrast.   Authorization number [id number] valid from 4/9 to 10/26/13.    Spoke with [facility] imaging. Requested order be changed to Bilateral Breast MRI w wo contrast-order entered.  Patient needs recent CMP-order entered.   Requested copy of most recent mammogram from [facility] 3D mammogram from 09/18/13-copy to Dr. Edward Jolly for review.   Message left for patient to to return call to [place] at [phone], will attempt to obtain appointment prior to 10/26/13.   Routing to Dr. Edward Jolly for review.

## 2013-10-17 NOTE — Telephone Encounter (Signed)
Return call to [place]. °

## 2013-10-17 NOTE — Telephone Encounter (Signed)
Returning a call to [place] °

## 2013-10-17 NOTE — Telephone Encounter (Signed)
Message left to return call to [place] at [phone].   Need to advise of approval of MRI to patient, approval until 5/8.   Needs Labs done prior. Needs MRI appointment.  Questions for MRI appointment: Claustrophobia: High Blood pressure or Diabetes: Kidney or liver disease: Allergy to IV contrast: Insurance Type: Location:

## 2013-10-17 NOTE — Telephone Encounter (Signed)
Spoke with patient. She will be out of town and unable to schedule Breast MRI prior to 5/8. She will return 10/28/13.  Advised we will need to precert breast MRI again and have lab work drawn prior.   Patient denies hx of claustrophobia-has had MRI's in the past Hx of Dm, has external insulin pump.  No Kidney or liver disease.  No allergy to IV dye or contrast.  [employer].

## 2013-11-06 NOTE — Telephone Encounter (Signed)
  ID: [id number]  DOB: 05-11-1956  CPT: 09323  Dx: V16.3  Auth# [id number]   Date: 05.19.2015  Time: 0838  Rep: Lodema Pilot auth for test to be performed at [facility].   **note correction to auth # also   Thanks,  [employer]

## 2013-11-06 NOTE — Telephone Encounter (Signed)
Spoke with patient. She is ready to schedule but would like to know her out of pocket cost. She was given contact number to [facility] and will call to discuss. Patient will call back if she decides not to move forward with MRI.   She will call back if she would like blood testing prior to MRI to be done at [facility].

## 2013-11-07 ENCOUNTER — Encounter: Payer: Self-pay | Admitting: Obstetrics and Gynecology

## 2013-11-07 NOTE — Telephone Encounter (Signed)
Spoke with Pam at [facility].  Someone from billing should reach out to patient with out of pocket information.   They will draw creatinine at time of MRI.  Insulin pump removable.  Notified patient she should except to hear from [facility]. She is agreeable.

## 2013-11-08 NOTE — Telephone Encounter (Signed)
Patient is scheduled for MRI 11/16/13. Will close encounter.

## 2013-11-08 NOTE — Telephone Encounter (Signed)
French Ana, This message was sent through My Chart for you from my patient regarding her breast MRI. Conley Simmonds ----- Message ----- From: [name] Sent: 11/07/2013 7:34 PM To: [facility] Clinical Pool Subject: Non-Urgent Medical Question This message is for French Ana: I scheduled an appointment today for the MRI on Friday, May 29 at 1:30, at [facility]. You can take this off your "to do list" now. And thank you so much for your help! [name] Molstad [facility])

## 2013-11-16 ENCOUNTER — Other Ambulatory Visit: Payer: Medicare Other

## 2013-11-19 ENCOUNTER — Other Ambulatory Visit: Payer: Medicare Other

## 2013-11-22 ENCOUNTER — Ambulatory Visit
Admission: RE | Admit: 2013-11-22 | Discharge: 2013-11-22 | Disposition: A | Discharge status: Home / Self Care | Payer: Medicare Other | Source: Ambulatory Visit | Attending: Obstetrics and Gynecology | Admitting: Obstetrics and Gynecology

## 2013-11-22 DIAGNOSIS — R922 Inconclusive mammogram: Secondary | ICD-10-CM

## 2013-11-22 DIAGNOSIS — Z803 Family history of malignant neoplasm of breast: Secondary | ICD-10-CM

## 2013-11-22 MED ORDER — GADOBENATE DIMEGLUMINE 529 MG/ML IV SOLN
11.0000 mL | Freq: Once | INTRAVENOUS | Status: AC | PRN
  Administered 2013-11-22: 11 mL via INTRAVENOUS

## 2013-11-27 ENCOUNTER — Telehealth: Payer: Self-pay | Admitting: Emergency Medicine

## 2013-11-27 NOTE — Telephone Encounter (Signed)
Message left to return call to [place] at [phone].   Advised non urgent. There was a mychart message sent to patient but she has not viewed it.  Will advise of message from Dr. Edward Jolly.

## 2013-11-27 NOTE — Telephone Encounter (Signed)
Message copied by Joeseph Amor on Tue Nov 27, 2013 10:01 AM ------      Message from: Ricki Miller DE Gwenevere Ghazi, BROOK E      Created: Fri Nov 23, 2013  6:17 AM       Normal breast MRI result to patient through My Chart. ------

## 2013-11-27 NOTE — Telephone Encounter (Signed)
Patient given message from Dr. Edward Jolly. She is agreeable to plan. Will follow up at next annual with Dr. Edward Jolly.   Routing to provider for final review. Patient agreeable to disposition. Will close encounter

## 2013-11-30 ENCOUNTER — Other Ambulatory Visit: Payer: Self-pay | Admitting: *Deleted

## 2013-11-30 DIAGNOSIS — B3731 Acute candidiasis of vulva and vagina: Secondary | ICD-10-CM

## 2013-11-30 DIAGNOSIS — B373 Candidiasis of vulva and vagina: Secondary | ICD-10-CM

## 2013-11-30 NOTE — Telephone Encounter (Signed)
Faxed refill request received from [facility] for DIFLUCAN Last filled by MD on 09/28/12, #2 X 0 Last AEX - 09/19/13 Next AEX - 09/23/14  I spoke to the patient and she is not having any symptoms at this time.  Pt is diabetic and likes to have on hand.  Advised this medication is not routinely refilled without an office visit.  Pt voices understanding and is OK with that, but would like for me to send note anyway.

## 2014-03-20 ENCOUNTER — Encounter: Payer: Self-pay | Admitting: Internal Medicine

## 2014-04-08 ENCOUNTER — Other Ambulatory Visit: Payer: Self-pay | Admitting: Nurse Practitioner

## 2014-04-08 NOTE — Telephone Encounter (Signed)
Last refilled: 01/03/12 #26/3 refills  Last AEX:  09/19/13 with Dr. Jose Persia Scheduled: 09/23/14 with Dr. Edward Jolly Last Vitamin D check: 08/12/09 at 36  Given Vitamin D Protocol please advise.  (Chart Outside your door)

## 2014-04-09 ENCOUNTER — Telehealth: Payer: Self-pay | Admitting: Obstetrics and Gynecology

## 2014-04-09 DIAGNOSIS — E559 Vitamin D deficiency, unspecified: Secondary | ICD-10-CM

## 2014-04-09 NOTE — Telephone Encounter (Signed)
Patient says she is returning a call to [place]. No telephone note.

## 2014-04-09 NOTE — Telephone Encounter (Signed)
Reggy Eye, CMA at 04/09/2014  1:30 PM      Status: Signed            Left Message To Call Back            Reggy Eye, CMA at 04/08/2014  4:50 PM      Status: Signed            Last refilled: 01/03/12 #26/3 refills   Last AEX:  09/19/13 with Dr. Jose Persia Scheduled: 09/23/14 with Dr. Edward Jolly Last Vitamin D check: 08/12/09 at 36   Given Vitamin D Protocol please advise.   (Chart Outside your door)                Routing History      Priority Sent On From To Message Type        04/08/2014  5:38 PM Brook E Amundson de Gwenevere Ghazi, MD [facility], [place] Rx Response        04/08/2014  4:52 PM Reggy Eye, CMA Brook E Amundson de Gwenevere Ghazi, MD Rx Request        04/08/2014 11:27 AM Surescripts Out Interface [facility] Rx Refill Pool Rx Request            Created by      [employer] on 04/08/2014 11:27 AM             Refused        Disp Refills Start End      Vitamin D, Ergocalciferol, (DRISDOL) 50000 UNITS CAPS capsule [Pharmacy Med Name: VITAMIN D 50000IU CAP] 26 capsule   04/08/2014        Sig:  TAKE 1 CAPSULE EVERY WEEK      Class:  Normal      DAW:  No      Reason for Refusal:  Patient needs an appointment      Reason for Refusal Comment:  Needs vitamin D level checked please.      Refused By:  Nash Dimmer, MD

## 2014-04-09 NOTE — Telephone Encounter (Signed)
Refill encounter.   Spoke with patient. Advised needs lab appointment for lab draw for Vitamin D before further refills of rx ergocalciferol. Patient going out town, requests appointment for 04/22/14, patient agreeable.  Scheduled appointment.

## 2014-04-09 NOTE — Telephone Encounter (Signed)
Left Message To Call Back  

## 2014-04-10 NOTE — Telephone Encounter (Signed)
Almedia Balls, RN at 04/09/2014 3:29 PM     Status: Signed        Refill encounter.  Spoke with patient. Advised needs lab appointment for lab draw for Vitamin D before further refills of rx ergocalciferol. Patient going out town, requests appointment for 04/22/14, patient agreeable.  Scheduled appointment.

## 2014-04-22 ENCOUNTER — Encounter: Payer: Self-pay | Admitting: Obstetrics and Gynecology

## 2014-04-22 ENCOUNTER — Other Ambulatory Visit: Payer: Medicare Other

## 2014-04-22 DIAGNOSIS — E559 Vitamin D deficiency, unspecified: Secondary | ICD-10-CM

## 2014-04-23 LAB — VITAMIN D 25 HYDROXY (VIT D DEFICIENCY, FRACTURES): Vit D, 25-Hydroxy: 55 ng/mL (ref 30–89)

## 2014-06-24 ENCOUNTER — Telehealth: Payer: Self-pay | Admitting: Neurology

## 2014-06-24 NOTE — Telephone Encounter (Signed)
Moved appt to see dr Everlena Cooper from 07-25-14 to 06-25-14

## 2014-06-25 ENCOUNTER — Encounter: Payer: Self-pay | Admitting: Neurology

## 2014-06-25 ENCOUNTER — Ambulatory Visit (INDEPENDENT_AMBULATORY_CARE_PROVIDER_SITE_OTHER): Payer: Medicare Other | Admitting: Neurology

## 2014-06-25 VITALS — BP 120/66 | HR 80 | Temp 98.4°F | Resp 16 | Ht 64.0 in | Wt 131.2 lb

## 2014-06-25 DIAGNOSIS — E10319 Type 1 diabetes mellitus with unspecified diabetic retinopathy without macular edema: Secondary | ICD-10-CM

## 2014-06-25 DIAGNOSIS — M5136 Other intervertebral disc degeneration, lumbar region: Secondary | ICD-10-CM

## 2014-06-25 DIAGNOSIS — E1142 Type 2 diabetes mellitus with diabetic polyneuropathy: Secondary | ICD-10-CM

## 2014-06-25 DIAGNOSIS — M545 Low back pain, unspecified: Secondary | ICD-10-CM

## 2014-06-25 NOTE — Patient Instructions (Signed)
I believe that your back pain is due to arthritis and degenerative disc disease.  I do not think it is related to a pinched nerve.  Call with any questions or concerns.

## 2014-06-25 NOTE — Progress Notes (Signed)
NEUROLOGY CONSULTATION NOTE  Becky [name] MRN: [id number] DOB: 27-Jan-1956  Referring provider: Dr. Evlyn Kanner Primary care provider: Dr. Evlyn Kanner  Reason for consult:  Back pain  HISTORY OF PRESENT ILLNESS: Becky [name] is a 59 year old right-handed woman with rheumatoid arthritis, hypertension, type 1 diabetes mellitus with retinopathy, nephropathy and polyneuropathy, migraine, GERD, carpal tunnel syndrome, osteopenia, anemia and hyperlipidemia who presents for back pain.  Records, lumbar X-ray, and labs reviewed.  She has had back problems since she was 59 years old, however she has had significant back pain for the past 3 months.  She describes pain radiating up and down both sides of her lumbar spine.  The right side feels worse than the left side.  The pain is constant.  It is most painful following a prolonged period of inactivity, such as getting out of bed in the morning or getting out of the car after a trip.  It eases up as the day progresses.  She takes Aleve, which helps.  When she twists her torso, she will feel a pop, causing and knife-like pain down both sides of her spine.  When she sits down, she will feel pain go down into the hips.  She denies shooting pain radiating down into the legs.  She denies leg weakness.  She has chronic numbness related to diabetic neuropathy.  At first, it was thought to be myofascial. Cyclobenzaprine caused too much sedation.  Robaxin was ineffective.  She had an XR of the lumbar spine performed on 06/05/14, which showed degenerative changes with severe loss of disc space at L1-L2 and bilateral pars defects at L5.   Her rheumatoid arthritis is reportedly stable.  She takes [place]. She also takes Cymbalta 60mg .  She also takes Forteo, calcium and vitamin D.  PAST MEDICAL HISTORY: Past Medical History  Diagnosis Date  . IDDM (insulin dependent diabetes mellitus) 1969  . RA (rheumatoid arthritis) 2000  . Migraine headache   . Abnormal ECG   .  Hypercholesterolemia   . Sjoegren syndrome   . Otosclerosis   . S/P cardiac cath August 2013    Normal coronaries and normal LV function.  . Neuropathy   . Osteoporosis     10/2012    PAST SURGICAL HISTORY: Past Surgical History  Procedure Laterality Date  . Breast lumpectomy  1995  . Carpal tunnel release  2000  . Toe surgery  2000    bone removed  . Cesarean section  1987  . Lymph node dissection  2002  . Hysteroscopy  02/2001    and D & C  . Cataract extraction Right 09-05-12    MEDICATIONS: Current Outpatient Prescriptions on File Prior to Visit  Medication Sig Dispense Refill  . aspirin 81 MG tablet Take 81 mg by mouth daily.    Marland Kitchen azithromycin (AZASITE) 1 % ophthalmic solution 1 drop once daily for a week then off a week (rotating)    . calcium carbonate (OS-CAL) 600 MG TABS Take 600 mg by mouth daily.    . cycloSPORINE (RESTASIS) 0.05 % ophthalmic emulsion Place 1 drop into both eyes 2 (two) times daily.    . DULoxetine (CYMBALTA) 60 MG capsule Take 60 mg by mouth daily.    . folic acid (FOLVITE) 1 MG tablet Take 1 mg by mouth daily.    . furosemide (LASIX) 20 MG tablet Take 20 mg by mouth every other day.    . insulin aspart (NOVOLOG) 100 UNIT/ML injection Insulin pump    .  lansoprazole (PREVACID) 15 MG capsule Take 15 mg by mouth daily.    Marland Kitchen leflunomide (ARAVA) 20 MG tablet Take 20 mg by mouth daily.    Marland Kitchen losartan (COZAAR) 25 MG tablet Take 25 mg by mouth daily.    . methotrexate 1 G injection Inject into the vein. 25mg  injected weekly 0.6 ml    . Multiple Vitamin (MULTIVITAMIN) tablet Take 1 tablet by mouth daily.    POLYETHYLENE GLYCOL 3350 PO Take by mouth daily.    . pravastatin (PRAVACHOL) 20 MG tablet Take 20 mg by mouth daily.    . SUMAtriptan (IMITREX) 100 MG tablet Take 100 mg by mouth every 2 (two) hours as needed.    . Teriparatide, Recombinant, (FORTEO [place]) Inject 20 mcg into the skin daily.    Marland Kitchen topiramate (TOPAMAX) 200 MG tablet Take 200 mg by mouth  daily.    . traMADol (ULTRAM) 50 MG tablet Take 50 mg by mouth as needed.    . vitamin B-12 (CYANOCOBALAMIN) 1000 MCG tablet Take 1,000 mcg by mouth daily.    . Vitamin D, Ergocalciferol, (DRISDOL) 50000 UNITS CAPS Take 50,000 Units by mouth every 7 (seven) days.    . fluconazole (DIFLUCAN) 150 MG tablet Take 1 tablet (150 mg total) by mouth once. Take one tablet.  Repeat in 48 hours if symptoms are not completely resolved. (Patient not taking: Reported on 06/25/2014) 2 tablet 0   No current facility-administered medications on file prior to visit.    ALLERGIES: Allergies  Allergen Reactions  . Ciprofloxacin Rash  . Penicillins Rash    FAMILY HISTORY: Family History  Problem Relation Age of Onset  . Breast cancer Mother   . Breast cancer Paternal Aunt   . Breast cancer Maternal Grandmother   . Stroke Father   . Rheum arthritis Sister     SOCIAL HISTORY: History   Social History  . Marital Status: Married    Spouse Name: N/A    Number of Children: 1  . Years of Education: N/A   Occupational History  . disability    Social History Main Topics  . Smoking status: Never Smoker   . Smokeless tobacco: Never Used  . Alcohol Use: 1.0 oz/week    2 drink(s) per week     Comment: occ glass of Alcohol  . Drug Use: No  . Sexual Activity:    Partners: Male     Comment: vasectomy   Other Topics Concern  . Not on file   Social History Narrative    REVIEW OF SYSTEMS: Constitutional: No fevers, chills, or sweats, no generalized fatigue, change in appetite Eyes: No visual changes, double vision, eye pain Ear, nose and throat: No hearing loss, ear pain, nasal congestion, sore throat Cardiovascular: No chest pain, palpitations Respiratory:  No shortness of breath at rest or with exertion, wheezes GastrointestinaI: No nausea, vomiting, diarrhea, abdominal pain, fecal incontinence Genitourinary:  No dysuria, urinary retention or frequency Musculoskeletal:  Back pain, hand  pain Integumentary: No rash, pruritus, skin lesions Neurological: as above Psychiatric: No depression, insomnia, anxiety Endocrine: No palpitations, fatigue, diaphoresis, mood swings, change in appetite, change in weight, increased thirst Hematologic/Lymphatic:  No anemia, purpura, petechiae. Allergic/Immunologic: no itchy/runny eyes, nasal congestion, recent allergic reactions, rashes  PHYSICAL EXAM: Filed Vitals:   06/25/14 1529  BP: 120/66  Pulse: 80  Temp: 98.4 F (36.9 C)  Resp: 16   General: No acute distress Head:  Normocephalic/atraumatic Eyes:  fundi unremarkable, without vessel changes, exudates, hemorrhages or  papilledema. Neck: supple, no paraspinal tenderness, full range of motion Back: No paraspinal tenderness Heart: regular rate and rhythm Lungs: Clear to auscultation bilaterally. Vascular: No carotid bruits. Neurological Exam: Mental status: alert and oriented to person, place, and time, recent and remote memory intact, fund of knowledge intact, attention and concentration intact, speech fluent and not dysarthric, language intact. Cranial nerves: CN I: not tested CN II: pupils equal, round and reactive to light, visual fields intact, fundi unremarkable, without vessel changes, exudates, hemorrhages or papilledema. CN III, IV, VI:  full range of motion, no nystagmus, no ptosis CN V: facial sensation intact CN VII: upper and lower face symmetric CN VIII: hearing intact CN IX, X: gag intact, uvula midline CN XI: sternocleidomastoid and trapezius muscles intact CN XII: tongue midline Bulk & Tone: normal, no fasciculations. Motor:  5/5 throughout Sensation:  Reduced pinprick sensation in feet up to below the knees.  Reduced vibration sensation in feet. Deep Tendon Reflexes:  1+ in upper extremities, absent in lower extremities.  Toes downgoing. Finger to nose testing:  No dysmetria Heel to shin:  No dysmetria Gait:  Normal station and stride.  Able to turn.   Difficulty with tandem walking. Romberg negative. Positive FABER test bilaterally.  Straight leg raise negative.  IMPRESSION: Back pain related to degenerative disc disease of the lumbar spine and arthritis.  It does not appear to be radicular in nature.  PLAN: 1.  NSAIDs or other pain relievers would be most helpful.  I recommend follow up with PCP or rheumatology. 2.  Follow up as needed.  45 minutes spent with patient, over 50% spent discussing etiology and treatment.  Thank you for allowing me to take part in the care of this patient.  Shon Millet, DO  CC:  Adrian Prince, MD

## 2014-07-02 ENCOUNTER — Other Ambulatory Visit (HOSPITAL_COMMUNITY): Payer: Self-pay | Admitting: Interventional Radiology

## 2014-07-02 DIAGNOSIS — M5137 Other intervertebral disc degeneration, lumbosacral region: Secondary | ICD-10-CM

## 2014-07-15 ENCOUNTER — Other Ambulatory Visit (HOSPITAL_COMMUNITY): Payer: Self-pay | Admitting: Rheumatology

## 2014-07-15 ENCOUNTER — Ambulatory Visit (HOSPITAL_COMMUNITY)
Admission: RE | Admit: 2014-07-15 | Discharge: 2014-07-15 | Disposition: A | Discharge status: Home / Self Care | Payer: Medicare Other | Source: Ambulatory Visit | Attending: Interventional Radiology | Admitting: Interventional Radiology

## 2014-07-15 DIAGNOSIS — M8448XA Pathological fracture, other site, initial encounter for fracture: Secondary | ICD-10-CM | POA: Insufficient documentation

## 2014-07-15 DIAGNOSIS — M5126 Other intervertebral disc displacement, lumbar region: Secondary | ICD-10-CM | POA: Insufficient documentation

## 2014-07-15 DIAGNOSIS — M4317 Spondylolisthesis, lumbosacral region: Secondary | ICD-10-CM | POA: Insufficient documentation

## 2014-07-15 DIAGNOSIS — M545 Low back pain: Secondary | ICD-10-CM | POA: Diagnosis present

## 2014-07-15 DIAGNOSIS — R531 Weakness: Secondary | ICD-10-CM | POA: Insufficient documentation

## 2014-07-15 DIAGNOSIS — M5137 Other intervertebral disc degeneration, lumbosacral region: Secondary | ICD-10-CM

## 2014-07-15 LAB — POCT I-STAT CREATININE: Creatinine, Ser: 0.8 mg/dL (ref 0.50–1.10)

## 2014-07-15 MED ORDER — GADOBENATE DIMEGLUMINE 529 MG/ML IV SOLN
10.0000 mL | Freq: Once | INTRAVENOUS | Status: AC | PRN
  Administered 2014-07-15: 10 mL via INTRAVENOUS

## 2014-07-25 ENCOUNTER — Ambulatory Visit: Payer: Medicare Other | Admitting: Neurology

## 2014-09-23 ENCOUNTER — Ambulatory Visit: Payer: Medicare Other | Admitting: Obstetrics and Gynecology

## 2014-09-25 ENCOUNTER — Ambulatory Visit: Payer: Medicare Other | Admitting: Obstetrics and Gynecology

## 2014-09-30 ENCOUNTER — Encounter: Payer: Self-pay | Admitting: Internal Medicine

## 2014-10-08 ENCOUNTER — Ambulatory Visit: Payer: Self-pay | Admitting: Nurse Practitioner

## 2014-12-09 ENCOUNTER — Encounter: Payer: Self-pay | Admitting: Nurse Practitioner

## 2014-12-09 ENCOUNTER — Encounter: Payer: Medicare Other | Admitting: Nurse Practitioner

## 2014-12-09 ENCOUNTER — Telehealth: Payer: Self-pay | Admitting: Nurse Practitioner

## 2014-12-09 NOTE — Patient Instructions (Signed)

## 2014-12-09 NOTE — Progress Notes (Deleted)
Patient ID: Becky [name], female   DOB: August 13, 1955, 59 y.o.   MRN: [id number] 59 y.o. G56P1001 Married  Caucasian Fe here for annual exam.    Patient's last menstrual period was 09/19/2008.          Sexually active: {yes no:314532}  The current method of family planning is {contraception:315051}.    Exercising: {yes no:314532}  {types:19826} Smoker:  {YES [id number]  Health Maintenance: Pap:  01/03/12, Negative with neg HR HPV MMG:  Bi-Rads 2:  benign findings Colon:  10/01/09, polyp, repeat in 5 years BMD:  07/10/07, *** TDaP:  *** Labs:  *** ***   reports that she has never smoked. She has never used smokeless tobacco. She reports that she drinks about 1.0 oz of alcohol per week. She reports that she does not use illicit drugs.  Past Medical History  Diagnosis Date  . IDDM (insulin dependent diabetes mellitus) 1969  . RA (rheumatoid arthritis) 2000  . Migraine headache   . Abnormal ECG   . Hypercholesterolemia   . Sjoegren syndrome   . Otosclerosis   . S/P cardiac cath August 2013    Normal coronaries and normal LV function.  . Neuropathy   . Osteoporosis     10/2012    Past Surgical History  Procedure Laterality Date  . Breast lumpectomy  1995  . Carpal tunnel release  2000  . Toe surgery  2000    bone removed  . Cesarean section  1987  . Lymph node dissection  2002  . Hysteroscopy  02/2001    and D & C  . Cataract extraction Right 09-05-12    Current Outpatient Prescriptions  Medication Sig Dispense Refill  . aspirin 81 MG tablet Take 81 mg by mouth daily.    Marland Kitchen azithromycin (AZASITE) 1 % ophthalmic solution 1 drop once daily for a week then off a week (rotating)    . calcium carbonate (OS-CAL) 600 MG TABS Take 600 mg by mouth daily.    . cyclobenzaprine (FLEXERIL) 10 MG tablet Take 10 mg by mouth 3 (three) times daily as needed for muscle spasms.    . cycloSPORINE (RESTASIS) 0.05 % ophthalmic emulsion Place 1 drop into both eyes 2 (two) times daily.    .  DULoxetine (CYMBALTA) 60 MG capsule Take 60 mg by mouth daily.    . fluconazole (DIFLUCAN) 150 MG tablet Take 1 tablet (150 mg total) by mouth once. Take one tablet.  Repeat in 48 hours if symptoms are not completely resolved. (Patient not taking: Reported on 06/25/2014) 2 tablet 0  . folic acid (FOLVITE) 1 MG tablet Take 1 mg by mouth daily.    . furosemide (LASIX) 20 MG tablet Take 20 mg by mouth every other day.    . insulin aspart (NOVOLOG) 100 UNIT/ML injection Insulin pump    . lansoprazole (PREVACID) 15 MG capsule Take 15 mg by mouth daily.    Marland Kitchen leflunomide (ARAVA) 20 MG tablet Take 20 mg by mouth daily.    Marland Kitchen losartan (COZAAR) 25 MG tablet Take 25 mg by mouth daily.    . methotrexate 1 G injection Inject into the vein. 25mg  injected weekly 0.6 ml    . Multiple Vitamin (MULTIVITAMIN) tablet Take 1 tablet by mouth daily.    . nabumetone (RELAFEN) 500 MG tablet Take 500 mg by mouth daily.    POLYETHYLENE GLYCOL 3350 PO Take by mouth daily.    . pravastatin (PRAVACHOL) 20 MG tablet Take 20 mg by mouth  daily.    . SUMAtriptan (IMITREX) 100 MG tablet Take 100 mg by mouth every 2 (two) hours as needed.    . Teriparatide, Recombinant, (FORTEO [place]) Inject 20 mcg into the skin daily.    Marland Kitchen topiramate (TOPAMAX) 200 MG tablet Take 200 mg by mouth daily.    . traMADol (ULTRAM) 50 MG tablet Take 50 mg by mouth as needed.    . vitamin B-12 (CYANOCOBALAMIN) 1000 MCG tablet Take 1,000 mcg by mouth daily.    . Vitamin D, Ergocalciferol, (DRISDOL) 50000 UNITS CAPS Take 50,000 Units by mouth every 7 (seven) days.     No current facility-administered medications for this visit.    Family History  Problem Relation Age of Onset  . Breast cancer Mother   . Breast cancer Paternal Aunt   . Breast cancer Maternal Grandmother   . Stroke Father   . Rheum arthritis Sister     ROS:  Pertinent items are noted in HPI.  Otherwise, a comprehensive ROS was negative.  Exam:   LMP 09/19/2008   Ht Readings from  Last 3 Encounters:  06/25/14 5\' 4"  (1.626 m)  09/19/13 5\' 4"  (1.626 m)  10/11/12 5\' 4"  (1.626 m)    General appearance: alert, cooperative and appears stated age Head: Normocephalic, without obvious abnormality, atraumatic Neck: no adenopathy, supple, symmetrical, trachea midline and thyroid {EXAM; THYROID:18604} Lungs: clear to auscultation bilaterally Breasts: {Exam; breast:13139::"normal appearance, no masses or tenderness"} Heart: regular rate and rhythm Abdomen: soft, non-tender; no masses,  no organomegaly Extremities: extremities normal, atraumatic, no cyanosis or edema Skin: Skin color, texture, turgor normal. No rashes or lesions Lymph nodes: Cervical, supraclavicular, and axillary nodes normal. No abnormal inguinal nodes palpated Neurologic: Grossly normal   Pelvic: External genitalia:  no lesions              Urethra:  normal appearing urethra with no masses, tenderness or lesions              Bartholin's and Skene's: normal                 Vagina: normal appearing vagina with normal color and discharge, no lesions              Cervix: {exam; cervix:14595}              Pap taken: {yes no:314532} Bimanual Exam:  Uterus:  {exam; uterus:12215}              Adnexa: {exam; adnexa:12223}               Rectovaginal: Confirms               Anus:  normal sphincter tone, no lesions  Chaperone present:  ***  A:  Well Woman with normal exam  P:   Reviewed health and wellness pertinent to exam  Pap smear as above  {plan; gyn:5269::"mammogram","pap smear","return annually or prn"}  An After Visit Summary was printed and given to the patient.    This encounter was created in error - please disregard.

## 2014-12-09 NOTE — Telephone Encounter (Signed)
Patient  Has blue medicare and needs authorization first. Message to Shanda Bumps will reschedule once we get the authorization.

## 2014-12-09 NOTE — Progress Notes (Signed)
Patient ID: Becky [name], female   DOB: October 04, 1955, 59 y.o.   MRN: [id number] Erroneous encounter

## 2014-12-16 ENCOUNTER — Other Ambulatory Visit: Payer: Self-pay

## 2015-02-12 ENCOUNTER — Encounter: Payer: Self-pay | Admitting: Internal Medicine

## 2015-03-18 ENCOUNTER — Encounter: Payer: Self-pay | Admitting: Nurse Practitioner

## 2015-03-18 ENCOUNTER — Ambulatory Visit (INDEPENDENT_AMBULATORY_CARE_PROVIDER_SITE_OTHER): Payer: Medicare Other | Admitting: Nurse Practitioner

## 2015-03-18 VITALS — BP 110/62 | HR 64 | Resp 16 | Ht 64.5 in | Wt 120.0 lb

## 2015-03-18 DIAGNOSIS — Z01419 Encounter for gynecological examination (general) (routine) without abnormal findings: Secondary | ICD-10-CM

## 2015-03-18 DIAGNOSIS — Z0000 Encounter for general adult medical examination without abnormal findings: Secondary | ICD-10-CM

## 2015-03-18 DIAGNOSIS — Z803 Family history of malignant neoplasm of breast: Secondary | ICD-10-CM | POA: Diagnosis not present

## 2015-03-18 NOTE — Patient Instructions (Addendum)

## 2015-03-18 NOTE — Progress Notes (Signed)
59 y.o. G68P1001 Married  Caucasian Fe here for annual exam.  Still has problems with HA's and now getting Botox injections every 3 mos. - does 32 injections all around the head.  Last HGB AIC 7.0.    Her daughter just got married in [place] at the [place].  Patient's last menstrual period was 09/19/2008.          Sexually active: Yes.    The current method of family planning is vasectomy.    Exercising: Yes.    walking Smoker:  no  Health Maintenance: Pap:  2013-WNL/negative HR HPV MMG:  11/04/14 3D BiRads 2-Benign Colonoscopy:  2012-Dr Henrene Pastor scheduled in 10/16 BMD:   2014-on Forteo, will finish in 1/17 or 2/17 TDaP:  2015 Labs: PCP   reports that she has never smoked. She has never used smokeless tobacco. She reports that she drinks about 1.0 oz of alcohol per week. She reports that she does not use illicit drugs.  Past Medical History  Diagnosis Date  . IDDM (insulin dependent diabetes mellitus) 1969  . RA (rheumatoid arthritis) 2000  . Migraine headache   . Abnormal ECG   . Hypercholesterolemia   . Sjoegren syndrome   . Otosclerosis   . S/P cardiac cath August 2013    Normal coronaries and normal LV function.  . Neuropathy   . Osteoporosis     10/2012  . Cataract     and floaters    Past Surgical History  Procedure Laterality Date  . Breast lumpectomy  1995  . Carpal tunnel release  2000  . Toe surgery  2000    bone removed  . Cesarean section  1987  . Lymph node dissection  2002  . Hysteroscopy  02/2001    and D & C  . Cataract extraction Right 09-05-12  . Cataract extraction Left 3/15    Current Outpatient Prescriptions  Medication Sig Dispense Refill  . aspirin 81 MG tablet Take 81 mg by mouth daily.    Marland Kitchen azithromycin (AZASITE) 1 % ophthalmic solution 1 drop once daily for a week then off a week (rotating)    . calcium carbonate (OS-CAL) 600 MG TABS Take 600 mg by mouth daily.    . cyclobenzaprine (FLEXERIL) 10 MG tablet Take 10 mg by mouth 3 (three)  times daily as needed for muscle spasms.    . cycloSPORINE (RESTASIS) 0.05 % ophthalmic emulsion Place 1 drop into both eyes 2 (two) times daily.    . DULoxetine (CYMBALTA) 60 MG capsule Take 60 mg by mouth daily.    Marland Kitchen FLUARIX QUADRIVALENT 0.5 ML injection TO BE ADMINISTERED BY PHARMACIST FOR IMMUNIZATION  0  . furosemide (LASIX) 20 MG tablet Take 20 mg by mouth every other day.    . insulin aspart (NOVOLOG) 100 UNIT/ML injection Insulin pump    . leflunomide (ARAVA) 20 MG tablet Take 20 mg by mouth daily.    Marland Kitchen losartan (COZAAR) 25 MG tablet Take 25 mg by mouth daily.    . Multiple Vitamin (MULTIVITAMIN) tablet Take 1 tablet by mouth daily.    Marland Kitchen POLYETHYLENE GLYCOL 3350 PO Take by mouth daily.    . pravastatin (PRAVACHOL) 20 MG tablet Take 20 mg by mouth daily.    . SUMAtriptan (IMITREX) 100 MG tablet Take 100 mg by mouth every 2 (two) hours as needed.    . Teriparatide, Recombinant, (FORTEO [place]) Inject 20 mcg into the skin daily.    Marland Kitchen topiramate (TOPAMAX) 200 MG tablet Take 200  mg by mouth daily.    . traMADol (ULTRAM) 50 MG tablet Take 50 mg by mouth as needed.    . vitamin B-12 (CYANOCOBALAMIN) 1000 MCG tablet Take 1,000 mcg by mouth daily.     No current facility-administered medications for this visit.    Family History  Problem Relation Age of Onset  . Breast cancer Mother 47  . Breast cancer Maternal Aunt     X 4  . Breast cancer Maternal Grandmother     late 70's and passed from cancer  . Stroke Father 72  . Rheum arthritis Sister   . Breast cancer Sister 1    Lumpectomy. BRCA negative    ROS:  Pertinent items are noted in HPI.  Otherwise, a comprehensive ROS was negative.  Exam:   BP 110/62 mmHg  Pulse 64  Resp 16  Ht 5' 4.5" (1.638 m)  Wt 120 lb (54.432 kg)  BMI 20.29 kg/m2  LMP 09/19/2008 Height: 5' 4.5" (163.8 cm) Ht Readings from Last 3 Encounters:  03/18/15 5' 4.5" (1.638 m)  06/25/14 _0  (1.626 m)  09/19/13 _1  (1.626 m)    General appearance:  alert, cooperative and appears stated age Head: Normocephalic, without obvious abnormality, atraumatic Neck: no adenopathy, supple, symmetrical, trachea midline and thyroid normal to inspection and palpation Lungs: clear to auscultation bilaterally Breasts: normal appearance, no masses or tenderness Heart: regular rate and rhythm Abdomen: soft, non-tender; no masses,  no organomegaly Extremities: extremities normal, atraumatic, no cyanosis or edema Skin: Skin color, texture, turgor normal. No rashes or lesions Lymph nodes: Cervical, supraclavicular, and axillary nodes normal. No abnormal inguinal nodes palpated Neurologic: Grossly normal   Pelvic: External genitalia:  no lesions              Urethra:  normal appearing urethra with no masses, tenderness or lesions              Bartholin's and Skene's: normal                 Vagina: normal appearing vagina with normal color and discharge, no lesions              Cervix: anteverted              Pap taken: No. Bimanual Exam:  Uterus:  normal size, contour, position, consistency, mobility, non-tender              Adnexa: no mass, fullness, tenderness               Rectovaginal: Confirms               Anus:  normal sphincter tone, no lesions  Chaperone present: yes  A:  Well Woman with normal exam  Postmenopausal   Strong [place] of Breast cancer  IDDM  RA & Sjoegrens syndrome  Osteoporosis   History of migraine HA's    P:   Reviewed health and wellness pertinent to exam  Pap smear as above  Mammogram is due 10/2015  Counseled on breast self exam, mammography screening, adequate intake of calcium and vitamin D, diet and exercise return annually or prn  An After Visit Summary was printed and given to the patient.

## 2015-03-20 LAB — IPS PAP TEST WITH HPV

## 2015-03-20 NOTE — Progress Notes (Signed)
Encounter reviewed by Dr. Janean Sark. Pap and HR HPV performed.

## 2015-03-27 ENCOUNTER — Telehealth: Payer: Self-pay | Admitting: Genetic Counselor

## 2015-03-27 NOTE — Telephone Encounter (Signed)
Genetic appt-s/w patient and gave genetic appt for 10/25 @ 10 w/Becky [name]

## 2015-04-15 ENCOUNTER — Ambulatory Visit (HOSPITAL_BASED_OUTPATIENT_CLINIC_OR_DEPARTMENT_OTHER): Payer: Medicare Other | Admitting: Genetic Counselor

## 2015-04-15 ENCOUNTER — Ambulatory Visit (AMBULATORY_SURGERY_CENTER): Payer: Self-pay

## 2015-04-15 ENCOUNTER — Other Ambulatory Visit: Payer: Medicare Other

## 2015-04-15 ENCOUNTER — Encounter: Payer: Self-pay | Admitting: Genetic Counselor

## 2015-04-15 ENCOUNTER — Telehealth: Payer: Self-pay

## 2015-04-15 VITALS — Ht 64.0 in | Wt 124.4 lb

## 2015-04-15 DIAGNOSIS — Z803 Family history of malignant neoplasm of breast: Secondary | ICD-10-CM | POA: Insufficient documentation

## 2015-04-15 DIAGNOSIS — Z8042 Family history of malignant neoplasm of prostate: Secondary | ICD-10-CM

## 2015-04-15 DIAGNOSIS — Z315 Encounter for genetic counseling: Secondary | ICD-10-CM | POA: Diagnosis not present

## 2015-04-15 DIAGNOSIS — Z809 Family history of malignant neoplasm, unspecified: Secondary | ICD-10-CM

## 2015-04-15 DIAGNOSIS — Z87898 Personal history of other specified conditions: Secondary | ICD-10-CM

## 2015-04-15 DIAGNOSIS — Z860100 Personal history of colon polyps, unspecified: Secondary | ICD-10-CM

## 2015-04-15 DIAGNOSIS — Z808 Family history of malignant neoplasm of other organs or systems: Secondary | ICD-10-CM | POA: Diagnosis not present

## 2015-04-15 DIAGNOSIS — Z8049 Family history of malignant neoplasm of other genital organs: Secondary | ICD-10-CM

## 2015-04-15 MED ORDER — SUPREP BOWEL PREP KIT 17.5-3.13-1.6 GM/177ML PO SOLN: 1.0000 | Freq: Once | ORAL | Status: DC

## 2015-04-15 NOTE — Progress Notes (Signed)
REFERRING PROVIDER: AMUNDSON DE Barbaraann Faster, MD  PRIMARY PROVIDER:  Sheela Stack, MD  PRIMARY REASON FOR VISIT:  1. Family history of breast cancer   2. Family history of cancer of female genital organ   3. Family history of prostate cancer   4. Family history of skin cancer   5. Family history of cervical cancer   6. History of abnormal mammogram      HISTORY OF PRESENT ILLNESS:   Becky [name], a 59 y.o. female, was seen for a [facility] cancer genetics consultation at the request of Dr. Forde Dandy due to a maternal family history of breast and other cancers.  Becky [name] presents to clinic today to discuss the possibility of a hereditary predisposition to cancer, genetic testing, and to further clarify her future cancer risks, as well as potential cancer risks for family members.    Becky [name] is a 59 y.o. female with no personal history of cancer.  She has a history of multiple breast biopsies and lumpectomies.     CANCER HISTORY:   No history exists.     HORMONAL RISK FACTORS:  Menarche was at age 70.  First live birth at age 60.  OCP use for approximately 0 years.  Ovaries intact: yes.  Hysterectomy: no.  Menopausal status: postmenopausal.  HRT use: maybe for one month  Colonoscopy: yes; one tubular adenoma was found in the transverse colon in 2011; she is scheduled to have her next colonoscopy soon. Mammogram within the last year: yes. Number of breast biopsies: 4-6. Up to date with pelvic exams:  yes. Any excessive radiation exposure in the past:  no  Past Medical History  Diagnosis Date  . IDDM (insulin dependent diabetes mellitus) ([place]) 1969  . RA (rheumatoid arthritis) ([place]) 2000  . Migraine headache   . Abnormal ECG   . Hypercholesterolemia   . 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 Otosclerosis   . S/P cardiac cath August 2013    Normal coronaries and normal LV function.  . Neuropathy ([place])   . Osteoporosis     10/2012  . Cataract     and  floaters    Past Surgical History  Procedure Laterality Date  . Breast lumpectomy  1995  . Carpal tunnel release  2000  . Toe surgery  2000    bone removed  . Cesarean section  1987  . Lymph node dissection  2002  . Hysteroscopy  02/2001    and D & C  . Cataract extraction Right 09-05-12  . Cataract extraction Left 3/15  . Eye surgery      diabetic retinopathy     Social History   Social History  . Marital Status: Married    Spouse Name: N/A  . Number of Children: 1  . Years of Education: N/A   Occupational History  . disability    Social History Main Topics  . Smoking status: Never Smoker   . Smokeless tobacco: Never Used  . Alcohol Use: 1.0 oz/week    2 Standard drinks or equivalent per week     Comment: maybe 1-2 cocktails per wk  . Drug Use: No  . Sexual Activity:    Partners: Male     Comment: vasectomy   Other Topics Concern  . None   Social History Narrative     FAMILY HISTORY:  We obtained a detailed, 4-generation family history.  Significant diagnoses are listed below: Family History  Problem Relation Age of Onset  .  Breast cancer Mother 74  . Colon polyps Mother     unspecified number  . Breast cancer Maternal Grandmother 27  . Stroke Father 28  . Rheum arthritis Sister   . Breast cancer Sister 48    lump; (-)GT (breast/ova ca panel, [facility])  . Skin cancer Maternal Grandfather     unspecified type  . Prostate cancer Maternal Grandfather     dx. late 57s  . Breast cancer Other     dx. in their late 76s  . Breast cancer Other     dx. 78-79  . Breast cancer Cousin     bilateral; dx. late 27s    Becky [name] has one daughter, age 10, who has never had cancer.  She has one full sister, Becky [name], who was recently diagnosed with breast cancer at 70 and who had negative genetic testing (Breast/Ovarian Cancer Panel) through [facility].  Becky [name] mother is currently 29.  She has a history of breast cancer diagnosed at 41 and a history of  colon polyps of unspecified number.  Becky [name] father is currently 38 and has not had cancer.    There is a maternal family history of breast cancer.  Becky [name]. Heckel mother had two full brothers, both of whom died due to issues related to agent orange exposure (at 68 and 26 years of age).  One uncle had three daughters and two sons; one of these daughters was diagnosed with a female cancer in her 30s; another daughter had a daughter who was diagnosed with cervical cancer in her early 69s.  There is no known cancer in any of Becky [name]'s other maternal first cousins.  Becky [name] maternal grandmother was diagnosed with breast cancer at 88 and passed away shortly after.  She had three full sisters--one died during a sinus surgery in her 60s, the other two were diagnosed with breast cancer in their late 66s.  Becky [name] maternal great grandmother (MGM's mother) was diagnosed with breast cancer at 85.  Becky [name] maternal grandfather died from smoking-related illness at 77.  He had a histroy of skin cancer and of prostate cancer diagnosed in his late 62s.  Becky [name] has no further information for his siblings or parents.  Becky [name] is unaware of any additional genetic testing for her maternal relatives.  Becky [name] father is currently 47 and has not had cancer.  He had no full siblings, but had one half sister and four half brothers.  She has limited information for these relatives.  Her paternal grandmother died in childbirth of her father.  She is unaware of any cancer in the siblings/parents of this grandmother.  Her paternal grandfather died of liver cirrhosis at 57.  She has no information for his siblings or parents.  Patient's maternal ancestors are of [place] descent, and paternal ancestors are of [place] descent. There is no reported Ashkenazi Jewish ancestry. There is no known consanguinity.  GENETIC COUNSELING ASSESSMENT: Becky [name] is a 59 y.o. female with a  family history of breast cancer which is somewhat suggestive of a hereditary breast cancer syndrome and predisposition to cancer. We, therefore, discussed and recommended the following at today's visit.   DISCUSSION: We reviewed the characteristics, features and inheritance patterns of hereditary cancer syndromes, particularly those caused by mutations within the BRCA1/2 genes. We also discussed genetic testing, including the appropriate family members to test, the process of testing, insurance coverage and turn-around-time for results.  We discussed that it makes it  less likely that Becky [name] will test positive since her sister (who has a history of breast cancer) has already had negative genetic testing.  We discussed that regardless of Becky [name]' own genetic testing result, her mom would still be eligible for genetic counseling and testing, if interested.  A negative result cannot rule out a genetic cause of the maternal family history of breast cancer.  With these caveats, Becky [name]. Zogg would still like to undergo genetic testing to better determine her own cancer risks.  We discussed the implications of a negative, positive and/or variant of uncertain significant result. We recommended Becky [name]. Baird Cancer pursue genetic testing for the 20-gene Breast/Ovarian Cancer Panel through [facility] Hope Pigeon, MD).  The Breast/Ovarian Cancer Panel offered by [facility] includes sequencing and deletion/duplication analysis for the following 19 genes:  ATM, BARD1, BRCA1, BRCA2, BRIP1, CDH1, CHEK2, FANCC, MLH1, MSH2, MSH6, NBN, PALB2, PMS2, PTEN, RAD51C, RAD51D, TP53, and XRCC2.  This panel also includes deletion/duplication analysis (without sequencing) for one gene, EPCAM.   Based on Becky [name]. Alioto's family history of cancer, she meets medical criteria for genetic testing. Despite that she meets criteria, she may still have an out of pocket cost. We discussed that if her out of pocket cost for testing is over $100,  the laboratory will call and confirm whether she wants to proceed with testing.  If the out of pocket cost of testing is less than $100 she will be billed by the genetic testing laboratory.   Based on the patient's personal and family history, a statistical model (Tyrer-Cuzick model)  and literature data were used to estimate her risk of developing breast cancer. This estimates her lifetime risk of developing breast cancer to be approximately 28.8%. This estimation does not take into account any genetic testing results.  The patient's lifetime breast cancer risk is a preliminary estimate based on available information using one of several models endorsed by the [place] (ACS). The ACS recommends consideration of breast MRI screening as an adjunct to mammography for patients at high risk (defined as 20% or greater lifetime risk). A more detailed breast cancer risk assessment can be considered, if clinically indicated.   PLAN: After considering the risks, benefits, and limitations, Becky [name]. Baird Cancer  provided informed consent to pursue genetic testing and the blood sample was sent to [facility] for analysis of the 20-gene Breast/Ovarian Cancer Panel test. Results should be available within approximately 2-3 weeks' time, at which point they will be disclosed by telephone to Becky [name]. Baird Cancer, as will any additional recommendations warranted by these results. Becky [name]. Waldeck will receive a summary of her genetic counseling visit and a copy of her results once available. This information will also be available in Epic. We encouraged Becky [name]. Holsinger to remain in contact with cancer genetics annually so that we can continuously update the family history and inform her of any changes in cancer genetics and testing that may be of benefit for her family. Becky [name]. Ruane questions were answered to her satisfaction today. Our contact information was provided should additional questions or concerns arise.  Based on Becky [name].  Narasimhan's family history, we recommended her mother, who was diagnosed with breast cancer at age 70, have genetic counseling and testing. Becky [name]. Durrett will let us know if we can be of any assistance in coordinating genetic counseling and/or testing for this family member.   Thank you for the referral and allowing [place] to share in the care of your patient.   Becky Luz, Becky [name] Genetic Counselor  [email or web address] Phone: [phone]  The patient was seen for a total of 60 minutes in face-to-face genetic counseling.  This patient was discussed with Drs. Magrinat, Lindi Adie and/or Burr Medico who agrees with the above.    _______________________________________________________________________ For Office Staff:  Number of people involved in session: 1 Was an Intern/ student involved with case: no

## 2015-04-15 NOTE — Telephone Encounter (Signed)
Please contact Adrian Prince MD concerning pt's insulin pump before her colonoscopy.  Thank you, Angela/PV  Pt has already had previsit.  Please call her at [phone].

## 2015-04-15 NOTE — Progress Notes (Signed)
No allergies to eggs or soy No diet/weight loss meds No home oxygen No past problems with anesthesia  Has internet; refused emmi 

## 2015-04-29 ENCOUNTER — Encounter: Payer: Medicare Other | Admitting: Internal Medicine

## 2015-05-06 ENCOUNTER — Telehealth: Payer: Self-pay

## 2015-05-06 NOTE — Telephone Encounter (Signed)
Insulin pump letter faxed to Dr. South. 

## 2015-05-07 ENCOUNTER — Telehealth: Payer: Self-pay | Admitting: Genetic Counselor

## 2015-05-08 ENCOUNTER — Ambulatory Visit: Payer: Self-pay | Admitting: Genetic Counselor

## 2015-05-08 DIAGNOSIS — Z1379 Encounter for other screening for genetic and chromosomal anomalies: Secondary | ICD-10-CM

## 2015-05-09 NOTE — Telephone Encounter (Signed)
Discussed with [name] that her genetic test results were negative for pathogenic mutations within any of 20 genes that would cause her to be at an increased risk for breast, ovarian, or other related cancers.  Additionally, no variants of uncertain significance (VUSes) were found.  Thus, we still do not have an explanation for the family history of breast cancer.  ([name] sister, Becky [name], who was diagnosed with breast cancer at 21 also previously tested negative).  We cannot rule out that we may just not be testing the right genes at this point in time.  Since, many of the breast cancer diagnoses in the family have occurred over the age of 61, it could also be the case that there is a familial predisposition to breast cancer that isn't necessarily explained by a single gene mutation. Women in the family should still be considered to be at an increased risk for breast cancer based on the history .  [name]'s likely should continue having annual breast MRI in addition to annual mammograms.  Her daughter should begin having mammograms at the age of 46, based on Becky [name]'s age of diagnosis.  We also discussed that it still makes sense for [name]'s mother to have genetic testing, if interested.  We discussed [name]'s first cousin who has a history of a 'female cancer' diagnosed in her 9s.  She does not believe this was ovarian because this cousin had two children following her diagnosis.  I encouraged [name] to continue to update her family history with [place] in the future.  She is also welcome to call and inquire about updated genetic testing options that may be available in the future.

## 2015-05-12 DIAGNOSIS — Z1379 Encounter for other screening for genetic and chromosomal anomalies: Secondary | ICD-10-CM | POA: Insufficient documentation

## 2015-05-12 NOTE — Progress Notes (Signed)
GENETIC TEST RESULT  HPI: Ms. Eustice was previously seen in the [facility] due to a family history of breast and other cancers and concerns regarding a hereditary predisposition to cancer. Please refer to our prior cancer genetics clinic note from April 15, 2015 for more information regarding Ms. Schrack's medical, social and family histories, and our assessment and recommendations, at the time. Ms. Martorana recent genetic test results were disclosed to her, as were recommendations warranted by these results. These results and recommendations are discussed in more detail below.  GENETIC TEST RESULTS: At the time of Ms. Villamar's visit on 04/15/15, we recommended she pursue genetic testing of the 20-gene Breast/Ovarian Cancer Panel through [facility].  The Breast/Ovarian Cancer Panel offered by [facility] Hope Pigeon, MD) includes sequencing and deletion/duplication analysis for the following 19 genes:  ATM, BARD1, BRCA1, BRCA2, BRIP1, CDH1, CHEK2, FANCC, MLH1, MSH2, MSH6, NBN, PALB2, PMS2, PTEN, RAD51C, RAD51D, TP53, and XRCC2.  This panel also includes deletion/duplication analysis (without sequencing) for one gene, EPCAM.  Those results are now back, the report date for which is May 05, 2015.  Genetic testing was normal, and did not reveal a deleterious mutation in these genes.  Additionally, no variants of uncertain significance (VUSes) were found.  The test report will be scanned into EPIC and will be located under the Results Review tab in the Pathology>Molecular Pathology section.   We discussed with Ms. Gitto that since the current genetic testing is not perfect, it is possible there may be a gene mutation in one of these genes that current testing cannot detect, but that chance is small. We also discussed, that it is possible that another gene that has not yet been discovered, or that we have not yet tested, is responsible for the cancer  diagnoses in the family, and it is, therefore, important to remain in touch with cancer genetics in the future so that we can continue to offer Ms. Greenhouse the most up-to-date genetic testing.   CANCER SCREENING RECOMMENDATIONS: Given Ms. Tackitt's personal and family histories, we must interpret these negative results with some caution.  Families with features suggestive of hereditary risk for cancer tend to have multiple family members with cancer, diagnoses in multiple generations and diagnoses before the age of 35. Ms. Nilsson family exhibits some of these features. Thus this result may simply reflect our current inability to detect all mutations within these genes or there may be a different gene that has not yet been discovered or tested.    Thus, we still do not have an explanation for the family history of breast cancer.  (Ms. Eckerson sister, Mickel Baas, who was diagnosed with breast cancer at 92 also previously tested negative).  We cannot rule out that we may just not be testing the right genes at this point in time.  Since, many of the breast cancer diagnoses in the family have occurred over the age of 69, it could also be the case that there is a familial predisposition to breast cancer that isn't necessarily explained by a single gene mutation. Women in the family should still be considered to be at an increased risk for breast cancer based on the history.  Ms. Corso likely should continue having annual breast MRI in addition to annual mammograms.   RECOMMENDATIONS FOR FAMILY MEMBERS: Women in this family might be at some increased risk of developing cancer, over the general population risk, simply due to the family history of cancer. We recommended women in  this family have a yearly mammogram beginning at age 47, or 6 years younger than the earliest onset of cancer, an an annual clinical breast exam, and perform monthly breast self-exams.  Thus, Ms. Alessio's daughter should begin annual  mammogram screening at the age of 3 (based on [name]'s diagnosis at age 59).  Women in this family should also have a gynecological exam as recommended by their primary provider. All family members should have a colonoscopy by age 45.  Based on Ms. Searing's family history, we still recommend  her mother, who was diagnosed with breast cancer at age 68, have genetic counseling and testing.  It makes it less likely that she will test positive since both of her daughters have tested negative, but may still give [place] helpful information.  Ms. Sweetland will let us know if we can be of any assistance in coordinating genetic counseling and/or testing for this family member.   We also discussed Ms. Mussell's maternal first cousin who has a history of a 'female cancer' diagnosed in her 67s.  She does not believe this was ovarian because this cousin had two children following her diagnosis.  FOLLOW-UP: Lastly, we discussed with Ms. Glance that cancer genetics is a rapidly advancing field and it is possible that new genetic tests will be appropriate for her and/or her family members in the future. We encouraged her to remain in contact with cancer genetics on an annual basis so we can update her personal and family histories and let her know of advances in cancer genetics that may benefit this family.   Our contact number was provided. Ms. Ferrando questions were answered to her satisfaction, and she knows she is welcome to call us at anytime with additional questions or concerns.   Jeanine Luz, MS Genetic Counselor [email or web address] Phone: [phone]

## 2015-05-27 ENCOUNTER — Encounter (HOSPITAL_COMMUNITY): Payer: Self-pay

## 2015-06-10 ENCOUNTER — Telehealth: Payer: Self-pay | Admitting: Oncology

## 2015-06-10 NOTE — Telephone Encounter (Signed)
Left message for patient to return call to schedule np appt. [phone]

## 2015-06-25 ENCOUNTER — Encounter (HOSPITAL_COMMUNITY): Payer: Self-pay

## 2015-06-26 ENCOUNTER — Other Ambulatory Visit: Payer: Self-pay | Admitting: *Deleted

## 2015-06-26 DIAGNOSIS — N6019 Diffuse cystic mastopathy of unspecified breast: Secondary | ICD-10-CM

## 2015-06-26 DIAGNOSIS — Z803 Family history of malignant neoplasm of breast: Secondary | ICD-10-CM

## 2015-06-27 ENCOUNTER — Other Ambulatory Visit (HOSPITAL_BASED_OUTPATIENT_CLINIC_OR_DEPARTMENT_OTHER): Payer: Medicare Other

## 2015-06-27 DIAGNOSIS — N6019 Diffuse cystic mastopathy of unspecified breast: Secondary | ICD-10-CM

## 2015-06-27 DIAGNOSIS — Z803 Family history of malignant neoplasm of breast: Secondary | ICD-10-CM

## 2015-06-27 LAB — COMPREHENSIVE METABOLIC PANEL
ALT: 20 U/L (ref 0–55)
AST: 31 U/L (ref 5–34)
Albumin: 3.7 g/dL (ref 3.5–5.0)
Alkaline Phosphatase: 63 U/L (ref 40–150)
Anion Gap: 9 mEq/L (ref 3–11)
BUN: 21.6 mg/dL (ref 7.0–26.0)
CO2: 26 mEq/L (ref 22–29)
Calcium: 10.1 mg/dL (ref 8.4–10.4)
Chloride: 108 mEq/L (ref 98–109)
Creatinine: 0.9 mg/dL (ref 0.6–1.1)
EGFR: 70 mL/min/{1.73_m2} — ABNORMAL LOW (ref >90)
Glucose: 140 mg/dl (ref 70–140)
Potassium: 3.7 mEq/L (ref 3.5–5.1)
Sodium: 143 mEq/L (ref 136–145)
Total Bilirubin: <0.3 mg/dL (ref 0.20–1.20)
Total Protein: 6.8 g/dL (ref 6.4–8.3)

## 2015-06-27 LAB — CBC WITH DIFFERENTIAL/PLATELET
BASO%: 1.4 % (ref 0.0–2.0)
Basophils Absolute: 0.1 10*3/uL (ref 0.0–0.1)
EOS%: 21.2 % — ABNORMAL HIGH (ref 0.0–7.0)
Eosinophils Absolute: 1.2 10*3/uL — ABNORMAL HIGH (ref 0.0–0.5)
HCT: 40.2 % (ref 34.8–46.6)
HGB: 13.3 g/dL (ref 11.6–15.9)
LYMPH%: 24.8 % (ref 14.0–49.7)
MCH: 29.9 pg (ref 25.1–34.0)
MCHC: 33.1 g/dL (ref 31.5–36.0)
MCV: 90.3 fL (ref 79.5–101.0)
MONO#: 0.4 10*3/uL (ref 0.1–0.9)
MONO%: 7.3 % (ref 0.0–14.0)
NEUT#: 2.6 10*3/uL (ref 1.5–6.5)
NEUT%: 45.3 % (ref 38.4–76.8)
Platelets: 265 10*3/uL (ref 145–400)
RBC: 4.45 10*6/uL (ref 3.70–5.45)
RDW: 14.2 % (ref 11.2–14.5)
WBC: 5.7 10*3/uL (ref 3.9–10.3)
lymph#: 1.4 10*3/uL (ref 0.9–3.3)

## 2015-06-30 ENCOUNTER — Telehealth: Payer: Self-pay | Admitting: Internal Medicine

## 2015-07-01 NOTE — Telephone Encounter (Signed)
Lm with patient that her insurance would not pay for her prep so I would give her a sample instead.  I also told her that Dr. Evlyn Kanner has not responded to my request for insulin pump instructions pertaining to her procedure.

## 2015-07-01 NOTE — Telephone Encounter (Signed)
Lm about prep and insulin pump.  Awaiting response.

## 2015-07-02 NOTE — Telephone Encounter (Signed)
Spoke with patient and she will pick sample up from the front later in the afternoon.  She confirmed that she knows what to do with her insulin pump

## 2015-07-03 ENCOUNTER — Encounter: Payer: Self-pay | Admitting: Internal Medicine

## 2015-07-03 ENCOUNTER — Ambulatory Visit (AMBULATORY_SURGERY_CENTER): Payer: Medicare Other | Admitting: Internal Medicine

## 2015-07-03 VITALS — BP 138/74 | HR 80 | Temp 96.9°F | Resp 14 | Ht 64.0 in | Wt 124.0 lb

## 2015-07-03 DIAGNOSIS — Z1211 Encounter for screening for malignant neoplasm of colon: Secondary | ICD-10-CM | POA: Diagnosis present

## 2015-07-03 DIAGNOSIS — Z86010 Personal history of colonic polyps: Secondary | ICD-10-CM | POA: Diagnosis not present

## 2015-07-03 LAB — GLUCOSE, CAPILLARY
Glucose-Capillary: 64 mg/dL — ABNORMAL LOW (ref 65–99)
Glucose-Capillary: 94 mg/dL (ref 65–99)
Glucose-Capillary: 167 mg/dL — ABNORMAL HIGH (ref 65–99)

## 2015-07-03 MED ORDER — SODIUM CHLORIDE 0.9 % IV SOLN: 500.0000 mL | INTRAVENOUS | Status: DC

## 2015-07-03 NOTE — Patient Instructions (Signed)
YOU HAD AN ENDOSCOPIC PROCEDURE TODAY AT THE [facility]:   Refer to the procedure report that was given to you for any specific questions about what was found during the examination.  If the procedure report does not answer your questions, please call your gastroenterologist to clarify.  If you requested that your care partner not be given the details of your procedure findings, then the procedure report has been included in a sealed envelope for you to review at your convenience later.  YOU SHOULD EXPECT: Some feelings of bloating in the abdomen. Passage of more gas than usual.  Walking can help get rid of the air that was put into your GI tract during the procedure and reduce the bloating. If you had a lower endoscopy (such as a colonoscopy or flexible sigmoidoscopy) you may notice spotting of blood in your stool or on the toilet paper. If you underwent a bowel prep for your procedure, you may not have a normal bowel movement for a few days.  Please Note:  You might notice some irritation and congestion in your nose or some drainage.  This is from the oxygen used during your procedure.  There is no need for concern and it should clear up in a day or so.  SYMPTOMS TO REPORT IMMEDIATELY:   Following lower endoscopy (colonoscopy or flexible sigmoidoscopy):  Excessive amounts of blood in the stool  Significant tenderness or worsening of abdominal pains  Swelling of the abdomen that is new, acute  Fever of 100F or higher   For urgent or emergent issues, a gastroenterologist can be reached at any hour by calling [phone].   DIET: Your first meal following the procedure should be a small meal and then it is ok to progress to your normal diet. Heavy or fried foods are harder to digest and may make you feel nauseous or bloated.  Likewise, meals heavy in dairy and vegetables can increase bloating.  Drink plenty of fluids but you should avoid alcoholic beverages for 24  hours.  ACTIVITY:  You should plan to take it easy for the rest of today and you should NOT DRIVE or use heavy machinery until tomorrow (because of the sedation medicines used during the test).    FOLLOW UP: Our staff will call the number listed on your records the next business day following your procedure to check on you and address any questions or concerns that you may have regarding the information given to you following your procedure. If we do not reach you, we will leave a message.  However, if you are feeling well and you are not experiencing any problems, there is no need to return our call.  We will assume that you have returned to your regular daily activities without incident.  If any biopsies were taken you will be contacted by phone or by letter within the next 1-3 weeks.  Please call us at [phone] if you have not heard about the biopsies in 3 weeks.    SIGNATURES/CONFIDENTIALITY: You and/or your care partner have signed paperwork which will be entered into your electronic medical record.  These signatures attest to the fact that that the information above on your After Visit Summary has been reviewed and is understood.  Full responsibility of the confidentiality of this discharge information lies with you and/or your care-partner.   Resume medications. 

## 2015-07-03 NOTE — Progress Notes (Signed)
Patient awakening,vss,report to rn 

## 2015-07-03 NOTE — Op Note (Signed)
[facility] [address] [place], [address]   COLONOSCOPY PROCEDURE REPORT  [name], Becky [name]  MR#: [id number] BIRTHDATE: July 05, 1955 , 59  yrs. old GENDER: female ENDOSCOPIST: Roxy Cedar, MD REFERRED BD:[id number] Program Recall PROCEDURE DATE:  07/03/2015 PROCEDURE:   Colonoscopy, surveillance First Screening Colonoscopy - Avg.  risk and is 50 yrs.  old or older - No.  Prior Negative Screening - Now for repeat screening. N/A  History of Adenoma - Now for follow-up colonoscopy & has been > or = to 3 yrs.  Yes hx of adenoma.  Has been 3 or more years since last colonoscopy.  Polyps removed today? No Recommend repeat exam, <10 yrs? No ASA CLASS:   Class II INDICATIONS:Surveillance due to prior colonic neoplasia and PH Colon Adenoma.  . Index examination 2005 (negative); last examination 2011 with diminutive adenoma (fair prep, tortuous colon) MEDICATIONS: Monitored anesthesia care and Propofol 350 mg IV  DESCRIPTION OF PROCEDURE:   After the risks benefits and alternatives of the procedure were thoroughly explained, informed consent was obtained.  The digital rectal exam revealed no abnormalities of the rectum.   The [id number]  endoscope was introduced through the anus and advanced to the cecum, which was identified by both the appendix and ileocecal valve. No adverse events experienced.   The quality of the prep was good.  (Suprep was usedwith bottle of magnesium citrate)  The instrument was then slowly withdrawn as the colon was fully examined. Estimated blood loss is zero unless otherwise noted in this procedure report.      COLON FINDINGS: A normal appearing cecum(incidental 15 mm lipoma), ileocecal valve, and appendiceal orifice were identified.  The ascending, transverse, descending, sigmoid colon, and rectum appeared unremarkable.  Retroflexed views revealed internal hemorrhoids. The time to cecum = 0.0 Withdrawal time =  10.5   The scope was withdrawn and the procedure completed. COMPLICATIONS: There were no immediate complications.  ENDOSCOPIC IMPRESSION: 1. Normal colonoscopy  RECOMMENDATIONS: 1. Continue current colorectal surveillance recommendations with a repeat colonoscopy in 10 years.  eSigned:  Roxy Cedar, MD 07/03/2015 9:33 AM   cc: The Patient and Adrian Prince, MD

## 2015-07-03 NOTE — Progress Notes (Signed)
Pt. Has not expelled air abdomen soft, non distended and she denies pain,pt. Ambulated to bathroom to see if she would expel air. f

## 2015-07-03 NOTE — Progress Notes (Signed)
Pt is diabetic.  Has an insulin pump.  Pt checked her sugar this am 06:30 it was 52 at home.  She suspended her insuline pump for 1 hour then restarted it.  Blood sugar was checked here at [facility] on admission, 64.  Pt reported feeling, "Jittery".  Reported to Fanny Dance, CRNA and she gave [place] order for pt to stop her insulin pump and start IV with D5W going.  Pt suspended insulin pump.  IV started in her right wrist with D5W.  Maw  Recheck blood sugar 94 and pt said she did not feel "jittery" now.  D5W IV clamped off and open NS at [facility].  Reported to CRNA and recovery nurse. maw

## 2015-07-04 ENCOUNTER — Telehealth: Payer: Self-pay | Admitting: Emergency Medicine

## 2015-07-04 ENCOUNTER — Telehealth: Payer: Self-pay | Admitting: Oncology

## 2015-07-04 ENCOUNTER — Ambulatory Visit (HOSPITAL_BASED_OUTPATIENT_CLINIC_OR_DEPARTMENT_OTHER): Payer: Medicare Other | Admitting: Oncology

## 2015-07-04 VITALS — BP 149/61 | HR 91 | Temp 98.3°F | Resp 17 | Wt 125.9 lb

## 2015-07-04 DIAGNOSIS — E119 Type 2 diabetes mellitus without complications: Secondary | ICD-10-CM

## 2015-07-04 DIAGNOSIS — Z803 Family history of malignant neoplasm of breast: Secondary | ICD-10-CM

## 2015-07-04 DIAGNOSIS — M069 Rheumatoid arthritis, unspecified: Secondary | ICD-10-CM | POA: Diagnosis not present

## 2015-07-04 DIAGNOSIS — Z9189 Other specified personal risk factors, not elsewhere classified: Secondary | ICD-10-CM

## 2015-07-04 DIAGNOSIS — Z8371 Family history of colonic polyps: Secondary | ICD-10-CM

## 2015-07-04 DIAGNOSIS — M3500 Sicca syndrome, unspecified: Secondary | ICD-10-CM | POA: Diagnosis not present

## 2015-07-04 DIAGNOSIS — D721 Eosinophilia, unspecified: Secondary | ICD-10-CM

## 2015-07-04 DIAGNOSIS — I011 Acute rheumatic endocarditis: Secondary | ICD-10-CM

## 2015-07-04 DIAGNOSIS — Z794 Long term (current) use of insulin: Secondary | ICD-10-CM

## 2015-07-04 DIAGNOSIS — IMO0001 Reserved for inherently not codable concepts without codable children: Secondary | ICD-10-CM

## 2015-07-04 MED ORDER — TAMOXIFEN CITRATE 20 MG PO TABS: 20.0000 mg | ORAL_TABLET | Freq: Every day | ORAL | Status: DC

## 2015-07-04 NOTE — Telephone Encounter (Signed)
Appointments made and avs printed for patient °

## 2015-07-04 NOTE — Progress Notes (Signed)
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 It is best to discuss these unrelated problem separately:  Eosinophilia: We have a CBC with differential dating back to 01/26/2012. This showed WBC 6.1, Hb 11.9 and platelets 248. Absolute eosinophil count was 1.0. At that time the patient was worked up for atypical chest [pain, with an equivocal Myoview. She underwent catheterization 01/28/2012 which showed normal coronaries. Chest X-ray 01/26/2012 showed no infiltrates. There are multiple other CBC reports, all normal, but w/o differentials. We just repeated a CBC/diff here, which shows WBC 5.7, Hb 13.3, platelets 265, and abs eosiniphil count  1.2 (normal to 0.5 ).  The patient has a significant rheumatoid history including rheumatoid arthritis and Sjogren's syndrome, followed by Dr Tomi Likens. She tells me the arthritis is well controlled. Currently she denies rash, difficulty swallowing or painful swallowing, gastritis or history of ulcers; denies cough, phlegm production, pleurisy, or history of repeated URIs or PNA; or changes in bowel habits. She underwent biopsy of an anterior neck mass 10/13/2009 which showed only reactive  lymphoid hyperplasia([id number]). She denies travel outside the country, fevers, unexplained weight loss of unexplained fatigue.  High Risk for Breast Cancer:  The patient's mother, currently 19, was diagnosed with breast cancer at age 8. She also has a history of colon polyps. The patient's father is 9, with no history of cancer. The patient's only full sister, was diagnosed with breast cancer at age 43. She had a negative test through the breast or ovarian cancer panel. Further on the mother's side there is one cousin with "female cancer" diagnosed in her 59s, and another with cervical cancer in the 87s. The patient's maternal grandmother was diagnosed with breast cancer at age 69. That grandmother had 3 sisters 2 of whom were diagnosed with breast cancers in their 42s. (The third sister died in her 17s from surgical complications not related to cancer).-- In addition, the patient has breast density category C/D. Her breasts are lumpy making screening exam additionally difficult.  Biopsy of several right breast masses 10/09/1998 showed no evidence of malignancy ([id number])  Her subsequent history is as detailed below.  INTERVAL HISTORY: Becky [name]  was evaluated in the hematology clinic 07/04/2015  REVIEW OF SYSTEMS:  aside from the data are recorded above, a detailed review of systems today was noncontributory   PAST MEDICAL HISTORY: Past Medical History  Diagnosis Date  . IDDM (insulin dependent diabetes mellitus) ([facility]) 1969  . Migraine headache   . Abnormal ECG   . Hypercholesterolemia   . Sjoegren syndrome ([facility])   . Otosclerosis   . S/P cardiac cath August 2013    Normal coronaries and normal LV function.  . Neuropathy ([facility])   . Osteoporosis     10/2012  . RA (rheumatoid arthritis) ([facility])  2000  . Cataract     . Cateracts bil eyes removed  . Neuromuscular disorder ([facility])     bil legs & feet - neuropathy  . Anemia   . Anxiety   . GERD (gastroesophageal reflux disease)   . Heart  murmur     PAST SURGICAL HISTORY: Past Surgical History  Procedure Laterality Date  . Breast lumpectomy  1995  . Carpal tunnel release  2000  . Toe surgery  2000    bone removed  . Cesarean section  1987  . Lymph node dissection  2002  . Hysteroscopy  02/2001    and D & C  . Cataract extraction Right 09-05-12  . Cataract extraction Left 3/15  . Eye surgery      diabetic retinopathy   . Colonoscopy      FAMILY HISTORY Family History  Problem Relation Age of Onset  . Breast cancer Mother 2  . Colon polyps Mother     unspecified number  . Breast cancer Maternal Grandmother 91  . Stroke Father 22  . Rheum arthritis Sister   . Breast cancer Sister 48    lump; (-)GT (breast/ova ca panel, [facility])  . Skin cancer Maternal Grandfather     unspecified type  . Prostate cancer Maternal Grandfather     dx. late 69s  . Breast cancer Other     dx. in their late 92s  . Breast cancer Other     dx. 78-79  . Breast cancer Cousin     bilateral; dx. late 63s  . Colon cancer Neg Hx   . Esophageal cancer Neg Hx   . Rectal cancer Neg Hx   . Stomach cancer Neg Hx    (see also history of present illness above).  GYNECOLOGIC HISTORY:  Patient's last menstrual period was 09/19/2008.  Menarche age 40, first live birth age 33, she is GX P1, with the change of life occurring at age 84. She did not take hormone replacement. 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 Her husband Luciana Axe works as a Games developer. More specifically he does interior trim for [employer], so he does a lot of traveling. The patient's daughter is a Programme researcher, broadcasting/film/video and works in [place]. She got married in September 2016. The patient is not a church attender.     HEALTH MAINTENANCE: Social History  Substance Use Topics  . Smoking status: Never Smoker   . Smokeless tobacco: Never Used  . Alcohol Use: 2.4 oz/week     2 Standard drinks or equivalent, 2 Shots of liquor per week     Comment: maybe 1-2 cocktails per wk     Colonoscopy: 2012/ [facility]  PAP:  Bone density:   On forteo  Lipid panel:  Allergies  Allergen Reactions  . Ciprofloxacin Rash  . Penicillins Rash    Current Outpatient Prescriptions  Medication Sig Dispense Refill  . aspirin 81 MG tablet Take 81 mg by mouth daily.    Marland Kitchen azithromycin (AZASITE) 1 % ophthalmic solution Reported on 07/03/2015    . calcium carbonate (OS-CAL) 600 MG TABS Take 600 mg by mouth daily.    . cyclobenzaprine (FLEXERIL) 10 MG tablet Take 10 mg by mouth 3 (three) times daily as needed for muscle spasms. Reported on 07/03/2015    . cycloSPORINE (RESTASIS) 0.05 % ophthalmic emulsion Place 1 drop into both eyes 2 (two) times daily.    Marland Kitchen  DULoxetine (CYMBALTA) 60 MG capsule Take 60 mg by mouth daily.    Marland Kitchen FLUARIX QUADRIVALENT 0.5 ML injection TO BE ADMINISTERED BY PHARMACIST FOR IMMUNIZATION  0  . furosemide (LASIX) 20 MG tablet Take 20 mg by mouth every other day.    . insulin aspart (NOVOLOG) 100 UNIT/ML injection Insulin pump    . leflunomide (ARAVA) 20 MG tablet Take 20 mg by mouth daily.    . Linaclotide (LINZESS) 145 MCG CAPS capsule Take 145 mcg by mouth daily.    Marland Kitchen losartan (COZAAR) 25 MG tablet Take 25 mg by mouth daily.    . Multiple Vitamin (MULTIVITAMIN) tablet Take 1 tablet by mouth daily.    Marland Kitchen POLYETHYLENE GLYCOL 3350 PO Take by mouth daily.    . pravastatin (PRAVACHOL) 20 MG tablet Take 20 mg by mouth daily.    . SUMAtriptan (IMITREX) 100 MG tablet Take 100 mg by mouth every 2 (two) hours as needed.    . tamoxifen (NOLVADEX) 20 MG tablet Take 1 tablet (20 mg total) by mouth daily. 90 tablet 12  . Teriparatide, Recombinant, (FORTEO [place]) Inject 20 mcg into the skin daily.    Marland Kitchen topiramate (TOPAMAX) 200 MG tablet Take 200 mg by mouth daily.    . traMADol (ULTRAM) 50 MG tablet Take 50 mg by mouth as needed. Reported on 07/03/2015    . vitamin B-12  (CYANOCOBALAMIN) 1000 MCG tablet Take 1,000 mcg by mouth daily.     No current facility-administered medications for this visit.    OBJECTIVE: Middle-aged white woman in no acute distress Filed Vitals:   07/04/15 0944  BP: 149/61  Pulse: 91  Temp: 98.3 F (36.8 C)  Resp: 17     Body mass index is 21.6 kg/(m^2).    ECOG FS:1 - Symptomatic but completely ambulatory  Ocular: Sclerae unicteric, pupils equal, round and reactive to light Ear-nose-throat: Oropharynx clear and moist Lymphatic: No cervical or supraclavicular adenopathy Lungs no rales or rhonchi, good excursion bilaterally Heart regular rate and rhythm, no murmur appreciated Abd soft, nontender, positive bowel sounds, no splenomegaly MSK no focal spinal tenderness, no joint edema Neuro: non-focal, well-oriented, appropriate affect Breasts: deferred Skin: no rash noted    LAB RESULTS:  CMP     Component Value Date/Time   NA 143 06/27/2015 1004   NA 139 01/26/2012 1021   K 3.7 06/27/2015 1004   K 4.0 01/26/2012 1021   CL 107 01/26/2012 1021   CO2 26 06/27/2015 1004   CO2 24 01/26/2012 1021   GLUCOSE 140 06/27/2015 1004   GLUCOSE 186* 01/28/2012 1142   BUN 21.6 06/27/2015 1004   BUN 19 01/26/2012 1021   CREATININE 0.9 06/27/2015 1004   CREATININE 0.80 07/15/2014 1931   CALCIUM 10.1 06/27/2015 1004   CALCIUM 9.1 01/26/2012 1021   PROT 6.8 06/27/2015 1004   ALBUMIN 3.7 06/27/2015 1004   AST 31 06/27/2015 1004   ALT 20 06/27/2015 1004   ALKPHOS 63 06/27/2015 1004   BILITOT <0.30 06/27/2015 1004   GFRNONAA >60 10/09/2009 1313   GFRAA  10/09/2009 1313    >60        The eGFR has been calculated using the MDRD equation. This calculation has not been validated in all clinical situations. eGFR's persistently <60 mL/min signify possible Chronic Kidney Disease.    INo results found for: SPEP, UPEP  Lab Results  Component Value Date   WBC 5.7 06/27/2015   NEUTROABS 2.6 06/27/2015   HGB 13.3 06/27/2015  HCT 40.2 06/27/2015   MCV 90.3 06/27/2015   PLT 265 06/27/2015      Chemistry      Component Value Date/Time   NA 143 06/27/2015 1004   NA 139 01/26/2012 1021   K 3.7 06/27/2015 1004   K 4.0 01/26/2012 1021   CL 107 01/26/2012 1021   CO2 26 06/27/2015 1004   CO2 24 01/26/2012 1021   BUN 21.6 06/27/2015 1004   BUN 19 01/26/2012 1021   CREATININE 0.9 06/27/2015 1004   CREATININE 0.80 07/15/2014 1931      Component Value Date/Time   CALCIUM 10.1 06/27/2015 1004   CALCIUM 9.1 01/26/2012 1021   ALKPHOS 63 06/27/2015 1004   AST 31 06/27/2015 1004   ALT 20 06/27/2015 1004   BILITOT <0.30 06/27/2015 1004       No results found for: LABCA2  No components found for: LABCA125  No results for input(s): INR in the last 168 hours.  Urinalysis    Component Value Date/Time   BILIRUBINUR n 08/02/2013 1542   PROTEINUR n 08/02/2013 1542   UROBILINOGEN negative 08/02/2013 1542   NITRITE n 08/02/2013 1542   LEUKOCYTESUR Negative 08/02/2013 1542    STUDIES: Mammography at [facility] 11/04/2014 found breast composition to be category D. The study was otherwise negative.  ASSESSMENT: 60 y.o. 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 This panel also includes deletion/duplication analysis (without sequencing) for one gene, EPCAM.  (2)  benign eosinophilia associated with rheumatoid arthritis  PLAN: I spent approximately an hour with Christan going over her 2 problems today.  The reason for the initial referral here of course is eosinophilia. Careful history,review of systems, exam and lab work does not suggest a  primary eosinophilic syndrome. On the other hand the patient clearly has a history of immune dysregulatioas indicated by her Sjogren's syndrome and her rheumatoid arthritis and possibly her diabetes. The incidence of eosinophilia in cases of rheumatoid arthritis can range from 3-7%. It appears to be associated particularly with Sjogren's and with harder to treat cases. In summary I suspect Herbert's eosinophilia is just one more finding associated with her general immune systems' dysfunction. All this requires at this point is follow-up.  I am more  concerned by her risk of developing breast cancer. Despite the lack of a mutation, her risk of developing breast cancer in her lifetime approaches 60%. It is approximately 15% in the next 5 years.  We discussed bilateral mastectomies which I think would be overkill although not out of the question in her case. What I did recommend was yearly MRIs. She had an MRI last year and this should be repeated yearly indefinitely. I have gone ahead and written her for an MRI to be performed in August, shortly before she meets with Dr. Quincy Simmonds.  We also discussed antiestrogens in terms of risk reduction. Either tamoxifen or one of the aromatase inhibitors such as anastrozole can decrease the risk of breast cancer by approximately half. We discussed the possible toxicities, side effects and complications of these agents and I gave her that information in writing.  Delainey is interested in giving tamoxifen to try. This is the pill her  sister was just started on 2 weeks ago. She was to discuss that first with Dr. Forde Dandy. If she does decide to start it, I will see her about 3 months afterwards to work on any side effects that may develop particularly hot flashes issues.  Otherwise I plan to see Gearline one more time a year from now and if there have been no further changes will likely release her from follow-up then. She has a good understanding of this plan. She agrees with it.  She will call with any problems that may develop before her next visit here.  Chauncey Cruel, MD   07/06/2015 3:03 PM Medical Oncology and Hematology [facility] [address] [place], [place] [address] Tel. [phone]    Fax. [phone]

## 2015-07-04 NOTE — Telephone Encounter (Signed)
  Follow up Call-  Call back number 07/03/2015  Post procedure Call Back phone  # [phone] cell  Permission to leave phone message Yes     Patient questions:  Do you have a fever, pain , or abdominal swelling? No. Pain Score  0 *  Have you tolerated food without any problems? Yes.    Have you been able to return to your normal activities? Yes.    Do you have any questions about your discharge instructions: Diet   No. Medications  No. Follow up visit  No.  Do you have questions or concerns about your Care? No.  Actions: * If pain score is 4 or above: No action needed, pain <4.

## 2015-07-06 DIAGNOSIS — M0579 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 without organ or systems involvement: Secondary | ICD-10-CM | POA: Insufficient documentation

## 2015-07-06 DIAGNOSIS — IMO0001 Reserved for inherently not codable concepts without codable children: Secondary | ICD-10-CM | POA: Insufficient documentation

## 2015-07-06 DIAGNOSIS — D721 Eosinophilia, unspecified: Secondary | ICD-10-CM | POA: Insufficient documentation

## 2015-07-06 DIAGNOSIS — Z9189 Other specified personal risk factors, not elsewhere classified: Secondary | ICD-10-CM | POA: Insufficient documentation

## 2015-07-06 DIAGNOSIS — M3500 Sicca syndrome, unspecified: Secondary | ICD-10-CM | POA: Insufficient documentation

## 2015-07-06 DIAGNOSIS — Z794 Long term (current) use of insulin: Secondary | ICD-10-CM

## 2015-07-06 DIAGNOSIS — E119 Type 2 diabetes mellitus without complications: Secondary | ICD-10-CM

## 2015-07-07 NOTE — Addendum Note (Signed)
Addended by: Minerva Areola on: 07/07/2015 09:45 AM   Modules accepted: Orders, Medications

## 2015-07-17 ENCOUNTER — Telehealth: Payer: Self-pay | Admitting: *Deleted

## 2015-07-18 ENCOUNTER — Other Ambulatory Visit: Payer: Self-pay | Admitting: *Deleted

## 2015-07-18 MED ORDER — ANASTROZOLE 1 MG PO TABS: 1.0000 mg | ORAL_TABLET | Freq: Every day | ORAL | Status: DC

## 2015-07-18 NOTE — Telephone Encounter (Signed)
This RN spoke with pt per her call stating concerns secondary to start of tamoxifen- " things I forgot to tell Dr Darnelle Catalan "  Per call she read noted side effects of possible blood clots.  She states her grand mother died post partum of blood clot.  Her father in the past year had a mild stroke and is on a blood thinner (she did not know name ).  She herself is on a baby asa a day.  Per review with MD additional information obtained including father was not a smoker. The patient only used BCP for 1 month there fore does not have a known tolerance.  Per review of above MD recommended medically best to use arimidex vs tamoxifen.  Discussed with pt who verbalized understanding.  New prescription sent to pt's pharmacy.

## 2015-07-22 NOTE — Telephone Encounter (Signed)
NO ENTRY 

## 2015-07-23 DIAGNOSIS — G43719 Chronic migraine without aura, intractable, without status migrainosus: Secondary | ICD-10-CM | POA: Diagnosis not present

## 2015-07-23 DIAGNOSIS — R51 Headache: Secondary | ICD-10-CM | POA: Diagnosis not present

## 2015-07-28 DIAGNOSIS — E1040 Type 1 diabetes mellitus with diabetic neuropathy, unspecified: Secondary | ICD-10-CM | POA: Diagnosis not present

## 2015-08-06 DIAGNOSIS — H4321 Crystalline deposits in vitreous body, right eye: Secondary | ICD-10-CM | POA: Diagnosis not present

## 2015-08-06 DIAGNOSIS — E103591 Type 1 diabetes mellitus with proliferative diabetic retinopathy without macular edema, right eye: Secondary | ICD-10-CM | POA: Diagnosis not present

## 2015-08-06 DIAGNOSIS — E113291 Type 2 diabetes mellitus with mild nonproliferative diabetic retinopathy without macular edema, right eye: Secondary | ICD-10-CM | POA: Diagnosis not present

## 2015-08-06 DIAGNOSIS — H547 Unspecified visual loss: Secondary | ICD-10-CM | POA: Diagnosis not present

## 2015-08-06 DIAGNOSIS — E113591 Type 2 diabetes mellitus with proliferative diabetic retinopathy without macular edema, right eye: Secondary | ICD-10-CM | POA: Diagnosis not present

## 2015-08-19 ENCOUNTER — Telehealth: Payer: Self-pay | Admitting: *Deleted

## 2015-08-19 NOTE — Telephone Encounter (Signed)
Becky [name] called to this RN to state since starting arimidex her blood sugars have been running " 400-500 and very difficult to manage levels with insulin "  Becky [name] has been on the arimidex for 3 weeks.  Per above discussion she will hold arimidex - she understands blood sugars may still be effective until medication is totally out of her system. She will call this office Friday with blood sugar reading update.  This RN will inquire with MD per need for follow up with this office per above.

## 2015-08-22 ENCOUNTER — Other Ambulatory Visit: Payer: Self-pay | Admitting: Oncology

## 2015-08-22 ENCOUNTER — Other Ambulatory Visit: Payer: Self-pay | Admitting: *Deleted

## 2015-08-22 ENCOUNTER — Telehealth: Payer: Self-pay

## 2015-08-22 NOTE — Telephone Encounter (Signed)
Patient called to update RN on her blood sugar levels since going off the arimidex.  Wednesday blood sugar; 330  Thursday; 150  This morning; 71.  Patient states that she feels so much better and was wondering if Dr. Darnelle Catalan is going to put her on another medication.

## 2015-08-25 ENCOUNTER — Other Ambulatory Visit: Payer: Self-pay | Admitting: *Deleted

## 2015-08-26 ENCOUNTER — Telehealth: Payer: Self-pay | Admitting: *Deleted

## 2015-08-26 NOTE — Telephone Encounter (Signed)
This RN spoke with pt per MD review of reported high blood sugars while on the arimidex.  Per his review including research with current medications and literature- above is not a known side effect of the medication-  Discussed his recommendation that pt may resume medication and monitor for recurrent increase of blood sugars.  Per phone discussion pt is very reluctant to resume medication due to blood sugars as well overall " just felt absolutely terrible while on the medication ".  Plan per above is pt will be scheduled for MD follow up for discussion of medication.  Becky [name] is in agreement of the above.  POF sent to scheduling.

## 2015-08-27 ENCOUNTER — Telehealth: Payer: Self-pay | Admitting: Oncology

## 2015-08-27 NOTE — Telephone Encounter (Signed)
S.w. Pt and advised on April appt...the patient ok and aware °

## 2015-09-04 DIAGNOSIS — M791 Myalgia: Secondary | ICD-10-CM | POA: Diagnosis not present

## 2015-09-04 DIAGNOSIS — G43719 Chronic migraine without aura, intractable, without status migrainosus: Secondary | ICD-10-CM | POA: Diagnosis not present

## 2015-09-04 DIAGNOSIS — M542 Cervicalgia: Secondary | ICD-10-CM | POA: Diagnosis not present

## 2015-09-04 DIAGNOSIS — R51 Headache: Secondary | ICD-10-CM | POA: Diagnosis not present

## 2015-09-04 DIAGNOSIS — G518 Other disorders of facial nerve: Secondary | ICD-10-CM | POA: Diagnosis not present

## 2015-09-09 DIAGNOSIS — E1040 Type 1 diabetes mellitus with diabetic neuropathy, unspecified: Secondary | ICD-10-CM | POA: Diagnosis not present

## 2015-09-10 DIAGNOSIS — L300 Nummular dermatitis: Secondary | ICD-10-CM | POA: Diagnosis not present

## 2015-09-10 DIAGNOSIS — E1040 Type 1 diabetes mellitus with diabetic neuropathy, unspecified: Secondary | ICD-10-CM | POA: Diagnosis not present

## 2015-09-10 DIAGNOSIS — L820 Inflamed seborrheic keratosis: Secondary | ICD-10-CM | POA: Diagnosis not present

## 2015-09-10 DIAGNOSIS — L570 Actinic keratosis: Secondary | ICD-10-CM | POA: Diagnosis not present

## 2015-09-15 DIAGNOSIS — Z79899 Other long term (current) drug therapy: Secondary | ICD-10-CM | POA: Diagnosis not present

## 2015-09-17 DIAGNOSIS — H168 Other keratitis: Secondary | ICD-10-CM | POA: Diagnosis not present

## 2015-09-17 DIAGNOSIS — H04123 Dry eye syndrome of bilateral lacrimal glands: Secondary | ICD-10-CM | POA: Diagnosis not present

## 2015-09-17 DIAGNOSIS — E113491 Type 2 diabetes mellitus with severe nonproliferative diabetic retinopathy without macular edema, right eye: Secondary | ICD-10-CM | POA: Diagnosis not present

## 2015-09-17 DIAGNOSIS — E113492 Type 2 diabetes mellitus with severe nonproliferative diabetic retinopathy without macular edema, left eye: Secondary | ICD-10-CM | POA: Diagnosis not present

## 2015-10-07 ENCOUNTER — Other Ambulatory Visit: Payer: Self-pay | Admitting: Oncology

## 2015-10-07 DIAGNOSIS — D721 Eosinophilia: Secondary | ICD-10-CM | POA: Diagnosis not present

## 2015-10-07 DIAGNOSIS — M069 Rheumatoid arthritis, unspecified: Secondary | ICD-10-CM | POA: Diagnosis not present

## 2015-10-07 DIAGNOSIS — E1142 Type 2 diabetes mellitus with diabetic polyneuropathy: Secondary | ICD-10-CM | POA: Diagnosis not present

## 2015-10-07 DIAGNOSIS — Z803 Family history of malignant neoplasm of breast: Secondary | ICD-10-CM | POA: Diagnosis not present

## 2015-10-07 DIAGNOSIS — E113599 Type 2 diabetes mellitus with proliferative diabetic retinopathy without macular edema, unspecified eye: Secondary | ICD-10-CM | POA: Diagnosis not present

## 2015-10-07 DIAGNOSIS — M810 Age-related osteoporosis without current pathological fracture: Secondary | ICD-10-CM | POA: Diagnosis not present

## 2015-10-07 DIAGNOSIS — N08 Glomerular disorders in diseases classified elsewhere: Secondary | ICD-10-CM | POA: Diagnosis not present

## 2015-10-07 DIAGNOSIS — Z6821 Body mass index (BMI) 21.0-21.9, adult: Secondary | ICD-10-CM | POA: Diagnosis not present

## 2015-10-07 DIAGNOSIS — I10 Essential (primary) hypertension: Secondary | ICD-10-CM | POA: Diagnosis not present

## 2015-10-07 DIAGNOSIS — R5381 Other malaise: Secondary | ICD-10-CM | POA: Diagnosis not present

## 2015-10-07 DIAGNOSIS — E1040 Type 1 diabetes mellitus with diabetic neuropathy, unspecified: Secondary | ICD-10-CM | POA: Diagnosis not present

## 2015-10-07 NOTE — Progress Notes (Unsigned)
Was called by Dr. Clelia Croft to tell me that when the patient started anastrozole her sugars went up to 600. There was no other obvious reason for that. She went off in the sugars went back to normal.  Instead of repeating the experimental he wonders whether we shouldn't consider Evista. I think that's a very good suggestion and I will discuss it with the patient when she returns to see me later this month.

## 2015-10-14 ENCOUNTER — Ambulatory Visit (HOSPITAL_BASED_OUTPATIENT_CLINIC_OR_DEPARTMENT_OTHER): Payer: Medicare Other | Admitting: Oncology

## 2015-10-14 ENCOUNTER — Telehealth: Payer: Self-pay | Admitting: Oncology

## 2015-10-14 ENCOUNTER — Ambulatory Visit: Payer: Medicare Other

## 2015-10-14 VITALS — BP 145/48 | HR 84 | Temp 98.1°F | Resp 18 | Ht 64.0 in | Wt 128.1 lb

## 2015-10-14 DIAGNOSIS — M3500 Sicca syndrome, unspecified: Secondary | ICD-10-CM | POA: Diagnosis not present

## 2015-10-14 DIAGNOSIS — M069 Rheumatoid arthritis, unspecified: Secondary | ICD-10-CM | POA: Diagnosis not present

## 2015-10-14 DIAGNOSIS — Z794 Long term (current) use of insulin: Secondary | ICD-10-CM

## 2015-10-14 DIAGNOSIS — D721 Eosinophilia, unspecified: Secondary | ICD-10-CM

## 2015-10-14 DIAGNOSIS — Z9189 Other specified personal risk factors, not elsewhere classified: Secondary | ICD-10-CM

## 2015-10-14 DIAGNOSIS — M545 Low back pain: Secondary | ICD-10-CM

## 2015-10-14 DIAGNOSIS — I011 Acute rheumatic endocarditis: Secondary | ICD-10-CM

## 2015-10-14 DIAGNOSIS — M2550 Pain in unspecified joint: Secondary | ICD-10-CM

## 2015-10-14 DIAGNOSIS — E119 Type 2 diabetes mellitus without complications: Secondary | ICD-10-CM

## 2015-10-14 DIAGNOSIS — Z803 Family history of malignant neoplasm of breast: Secondary | ICD-10-CM

## 2015-10-14 DIAGNOSIS — IMO0001 Reserved for inherently not codable concepts without codable children: Secondary | ICD-10-CM

## 2015-10-14 MED ORDER — GABAPENTIN 300 MG PO CAPS: 300.0000 mg | ORAL_CAPSULE | Freq: Every day | ORAL | Status: DC

## 2015-10-14 MED ORDER — TAMOXIFEN CITRATE 20 MG PO TABS: 20.0000 mg | ORAL_TABLET | Freq: Every day | ORAL | Status: AC

## 2015-10-14 NOTE — Progress Notes (Signed)
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 It is best to discuss these unrelated problem separately:  Eosinophilia: We have a CBC with differential dating back to 01/26/2012. This showed WBC 6.1, Hb 11.9 and platelets 248. Absolute eosinophil count was 1.0. At that time the patient was worked up for atypical chest [pain, with an equivocal Myoview. She underwent catheterization 01/28/2012 which showed normal coronaries. Chest X-ray 01/26/2012 showed no infiltrates. There are multiple other CBC reports, all normal, but w/o differentials. We just repeated a CBC/diff here, which shows WBC 5.7, Hb 13.3, platelets 265, and abs eosiniphil count  1.2 (normal to 0.5 ).  The patient has a significant rheumatoid history including rheumatoid arthritis and Sjogren's syndrome, followed by Dr Tomi Likens. She tells me the arthritis is well controlled. Currently she denies rash, difficulty swallowing or painful swallowing, gastritis or history of ulcers; denies cough, phlegm production, pleurisy, or history of repeated URIs or PNA; or changes in bowel habits. She underwent biopsy of an anterior neck  mass 10/13/2009 which showed only reactive lymphoid hyperplasia([id number]). She denies travel outside the country, fevers, unexplained weight loss of unexplained fatigue.  High Risk for Breast Cancer: The patient's mother, currently 48, was diagnosed with breast cancer at age 51. She also has a history of colon polyps. The patient's father is 54, with no history of cancer. The patient's only full sister, was diagnosed with breast cancer at age 68. She had a negative test through the breast or ovarian cancer panel. Further on the mother's side there is one cousin with "female cancer" diagnosed in her 61s, and another with cervical cancer in the 66s. The patient's maternal grandmother was diagnosed with breast cancer at age 42. That grandmother had 3 sisters 2 of whom were diagnosed with breast cancers in their 37s. (The third sister died in her 36s from surgical complications not related to cancer).-- In addition, the patient has breast density category C/D. Her breasts are lumpy making screening exam additionally difficult.  Biopsy of several right breast masses 10/09/1998 showed no evidence of malignancy ([id number])  Her subsequent history is as detailed below.  INTERVAL HISTORY: Becky [name]  Returns today to the high risk of breast cancer clinic  For further discussion of her situation. She was started on anastrozole at the last visit. However this caused the glucose in excess of 500. This is to say the least unusual but the fact this when she stopped the medication the glucose levels marched right back to normal.  There was no other change in medication diet activity and no intercurrent infection that she is aware of. In addition she felt quite tired and she had even more pain in her joints then she normally does. She has felt  back to baseline since stopping the anastrozole. She is here to consider alternatives.  REVIEW OF SYSTEMS:  she describes herself is severely fatigued. She has pain in her back  and multiple other joints including the hands. Her eyes are dry. She has sores in her mouth. She keeps a dry cough. She says her diabetes is currently better controlled. She has some headaches. She has night sweats. She takes walks pretty much every day. A detailed review of systems today was otherwise stable  PAST MEDICAL HISTORY: Past Medical History  Diagnosis Date  . IDDM (insulin dependent diabetes mellitus) ([facility]) 1969  . Migraine headache   . Abnormal ECG   . Hypercholesterolemia   . Sjoegren syndrome ([facility])   . Otosclerosis   . S/P cardiac cath August 2013    Normal coronaries and normal LV function.  . Neuropathy ([facility])   . Osteoporosis     10/2012  . RA (rheumatoid arthritis) ([facility]) 2000  . Cataract     . Cateracts bil eyes removed  . Neuromuscular disorder ([facility])     bil legs & feet - neuropathy  . Anemia   . Anxiety   . GERD (gastroesophageal reflux disease)   . Heart murmur     PAST SURGICAL HISTORY: Past Surgical History  Procedure Laterality Date  . Breast lumpectomy  1995  . Carpal tunnel release  2000  . Toe surgery  2000    bone removed  . Cesarean section  1987  . Lymph node dissection  2002  . Hysteroscopy  02/2001    and D & C  . Cataract extraction Right 09-05-12  . Cataract extraction Left 3/15  . Eye surgery      diabetic retinopathy   . Colonoscopy      FAMILY HISTORY Family History  Problem Relation Age of Onset  . Breast cancer Mother 24  . Colon polyps Mother     unspecified number  . Breast cancer Maternal Grandmother 32  . Stroke Father 16  . Rheum arthritis Sister   . Breast cancer Sister 48    lump; (-)GT (breast/ova ca panel, [facility])  . Skin cancer Maternal Grandfather     unspecified type  . Prostate cancer Maternal Grandfather     dx. late 56s  . Breast cancer Other     dx. in their late 38s  . Breast cancer Other     dx. 78-79  . Breast cancer Cousin     bilateral; dx. late 103s  . Colon cancer Neg Hx   . Esophageal cancer  Neg Hx   . Rectal cancer Neg Hx   . Stomach cancer Neg Hx    (see also history of present illness above).   GYNECOLOGIC HISTORY:  Patient's last menstrual period was 09/19/2008.  Menarche age 71, first live birth age 62, she is GX P1, with the change of life occurring at age 69. She did not take hormone replacement. 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 Her husband Luciana Axe works as a Games developer. More specifically he does interior trim for [employer], so he does a lot of traveling. The patient's daughter is a Programme researcher, broadcasting/film/video and works in [place]. She got married in September 2016. The patient is not a church attender.     HEALTH MAINTENANCE: Social History  Substance Use Topics  . Smoking status: Never Smoker   . Smokeless tobacco: Never Used  .  Alcohol Use: 2.4 oz/week    2 Standard drinks or equivalent, 2 Shots of liquor per week     Comment: maybe 1-2 cocktails per wk     Colonoscopy: 2012/ [facility]  PAP:  Bone density:   On forteo  Lipid panel:  Allergies  Allergen Reactions  . Ciprofloxacin Rash  . Penicillins Rash    Current Outpatient Prescriptions  Medication Sig Dispense Refill  . Etanercept (ENBREL [place]) Inject into the skin once a week.    . methotrexate (RHEUMATREX) 2.5 MG tablet Take 20 mg by mouth once a week.    Marland Kitchen aspirin 81 MG tablet Take 81 mg by mouth daily.    . calcium carbonate (OS-CAL) 600 MG TABS Take 600 mg by mouth daily.    . cyclobenzaprine (FLEXERIL) 10 MG tablet Take 10 mg by mouth 3 (three) times daily as needed for muscle spasms. Reported on 07/03/2015    . cycloSPORINE (RESTASIS) 0.05 % ophthalmic emulsion Place 1 drop into both eyes 2 (two) times daily.    . DULoxetine (CYMBALTA) 60 MG capsule Take 60 mg by mouth daily.    . furosemide (LASIX) 20 MG tablet Take 20 mg by mouth every other day.    . gabapentin (NEURONTIN) 300 MG  capsule Take 1 capsule (300 mg total) by mouth at bedtime. 90 capsule 4  . insulin aspart (NOVOLOG) 100 UNIT/ML injection Insulin pump    . Linaclotide (LINZESS) 145 MCG CAPS capsule Take 145 mcg by mouth daily.    Marland Kitchen losartan (COZAAR) 25 MG tablet Take 25 mg by mouth daily.    . Multiple Vitamin (MULTIVITAMIN) tablet Take 1 tablet by mouth daily.    Marland Kitchen POLYETHYLENE GLYCOL 3350 PO Take by mouth daily.    . pravastatin (PRAVACHOL) 20 MG tablet Take 20 mg by mouth daily.    . SUMAtriptan (IMITREX) 100 MG tablet Take 100 mg by mouth every 2 (two) hours as needed.    . tamoxifen (NOLVADEX) 20 MG tablet Take 1 tablet (20 mg total) by mouth daily. 90 tablet 12  . topiramate (TOPAMAX) 200 MG tablet Take 200 mg by mouth daily.    . vitamin B-12 (CYANOCOBALAMIN) 1000 MCG tablet Take 1,000 mcg by mouth daily. Reported on 07/07/2015     No current facility-administered medications for this visit.    OBJECTIVE: Middle-aged white woman who appears stated age 41 Vitals:   10/14/15 0916  BP: 145/48  Pulse: 84  Temp: 98.1 F (36.7 C)  Resp: 18     Body mass index is 21.98 kg/(m^2).    ECOG FS:1 - Symptomatic but completely ambulatory  Sclerae unicteric, pupils round and equal Oropharynx clear, No thrush or other lesions No cervical or supraclavicular adenopathy Lungs no rales or rhonchi Heart regular rate and rhythm Abd soft, nontender, positive bowel sounds MSK no focal spinal tenderness Neuro: nonfocal, well oriented, appropriate affect Breasts: no suspicious masses in either breast. Both axillae are benign.  LAB RESULTS:  CMP     Component Value Date/Time   NA 143 06/27/2015 1004   NA 139 01/26/2012 1021   K 3.7 06/27/2015 1004   K 4.0 01/26/2012 1021   CL 107 01/26/2012 1021   CO2 26 06/27/2015 1004   CO2 24 01/26/2012 1021   GLUCOSE 140 06/27/2015 1004   GLUCOSE 186* 01/28/2012 1142   BUN 21.6 06/27/2015 1004   BUN 19 01/26/2012 1021   CREATININE 0.9 06/27/2015 1004    CREATININE 0.80 07/15/2014 1931   CALCIUM  10.1 06/27/2015 1004   CALCIUM 9.1 01/26/2012 1021   PROT 6.8 06/27/2015 1004   ALBUMIN 3.7 06/27/2015 1004   AST 31 06/27/2015 1004   ALT 20 06/27/2015 1004   ALKPHOS 63 06/27/2015 1004   BILITOT <0.30 06/27/2015 1004   GFRNONAA >60 10/09/2009 1313   GFRAA  10/09/2009 1313    >60        The eGFR has been calculated using the MDRD equation. This calculation has not been validated in all clinical situations. eGFR's persistently <60 mL/min signify possible Chronic Kidney Disease.    INo results found for: SPEP, UPEP  Lab Results  Component Value Date   WBC 5.7 06/27/2015   NEUTROABS 2.6 06/27/2015   HGB 13.3 06/27/2015   HCT 40.2 06/27/2015   MCV 90.3 06/27/2015   PLT 265 06/27/2015      Chemistry      Component Value Date/Time   NA 143 06/27/2015 1004   NA 139 01/26/2012 1021   K 3.7 06/27/2015 1004   K 4.0 01/26/2012 1021   CL 107 01/26/2012 1021   CO2 26 06/27/2015 1004   CO2 24 01/26/2012 1021   BUN 21.6 06/27/2015 1004   BUN 19 01/26/2012 1021   CREATININE 0.9 06/27/2015 1004   CREATININE 0.80 07/15/2014 1931      Component Value Date/Time   CALCIUM 10.1 06/27/2015 1004   CALCIUM 9.1 01/26/2012 1021   ALKPHOS 63 06/27/2015 1004   AST 31 06/27/2015 1004   ALT 20 06/27/2015 1004   BILITOT <0.30 06/27/2015 1004       No results found for: LABCA2  No components found for: LABCA125  No results for input(s): INR in the last 168 hours.  Urinalysis    Component Value Date/Time   BILIRUBINUR n 08/02/2013 1542   PROTEINUR n 08/02/2013 1542   UROBILINOGEN negative 08/02/2013 1542   NITRITE n 08/02/2013 1542   LEUKOCYTESUR Negative 08/02/2013 1542    STUDIES: Mammography at [facility] 11/04/2014 found breast composition to be category D. The study was otherwise negative.  ASSESSMENT: 60 y.o. [place], [place] woman at high risk for breast cancer, with a history of eosinophilia in the setting of rheumatoid  arthritis and Sjogren's syndrome  (1) genetics testing on 04/15/15 through the [facility] Panel through [facility] found no deleterious mutations in M, BARD1, BRCA1, BRCA2, BRIP1, CDH1, CHEK2, FANCC, MLH1, MSH2, MSH6, NBN, PALB2, PMS2, PTEN, RAD51C, RAD51D, TP53, and XRCC2. This panel also includes deletion/duplication analysis (without sequencing) for EPCAM.  (2)  benign eosinophilia likely associated with rheumatoid arthritis or secondary tomedication  PLAN: Becky [name] was not able to tolerate anastrozole and she is going off that medication.  Her primary care physician Dr. Brigitte Pulse suggested a switch to raloxifene, which is very reasonable.   Today we discussed in detail the difference between raloxifene and tamoxifen. Both are very similar in their benefits and risks. A simple way of comparing them is to say that tamoxifen is a little bit more effective at breast cancer prevention in the long run and raloxifene is a little bit less concerning for endometrial cancer. We discussed bone density issues as well. After much discussion what she would like to do is give tamoxifen to try and I have gone ahead and written her that prescription.   She has recently had many changes in medication and if one of the medications she was on was the cause of her mild but persistent eosinophilia that should become apparent over the next  few weeks or months. Otherwise as previously discussed  the elevated eosinophil count may be secondary to her rheumatic conditions.   I'm going to see her again in July just to make sure she is tolerating the tamoxifen well. Otherwise very likely we can see her on a once a year basis. She knows to call for any problems that may develop before that visit.    Chauncey Cruel, MD   10/14/2015 7:51 PM Medical Oncology and Hematology [facility] [address] [place], [place] [address] Tel. [phone]    Fax. [phone]

## 2015-10-14 NOTE — Telephone Encounter (Signed)
appt made and avs printed °

## 2015-10-17 DIAGNOSIS — E1040 Type 1 diabetes mellitus with diabetic neuropathy, unspecified: Secondary | ICD-10-CM | POA: Diagnosis not present

## 2015-10-21 DIAGNOSIS — E109 Type 1 diabetes mellitus without complications: Secondary | ICD-10-CM | POA: Diagnosis not present

## 2015-10-21 DIAGNOSIS — Z794 Long term (current) use of insulin: Secondary | ICD-10-CM | POA: Diagnosis not present

## 2015-10-21 DIAGNOSIS — M810 Age-related osteoporosis without current pathological fracture: Secondary | ICD-10-CM | POA: Diagnosis not present

## 2015-10-22 DIAGNOSIS — M542 Cervicalgia: Secondary | ICD-10-CM | POA: Diagnosis not present

## 2015-10-22 DIAGNOSIS — E1040 Type 1 diabetes mellitus with diabetic neuropathy, unspecified: Secondary | ICD-10-CM | POA: Diagnosis not present

## 2015-10-22 DIAGNOSIS — M791 Myalgia: Secondary | ICD-10-CM | POA: Diagnosis not present

## 2015-10-22 DIAGNOSIS — G43719 Chronic migraine without aura, intractable, without status migrainosus: Secondary | ICD-10-CM | POA: Diagnosis not present

## 2015-10-22 DIAGNOSIS — R51 Headache: Secondary | ICD-10-CM | POA: Diagnosis not present

## 2015-10-22 DIAGNOSIS — G518 Other disorders of facial nerve: Secondary | ICD-10-CM | POA: Diagnosis not present

## 2015-10-25 ENCOUNTER — Emergency Department (HOSPITAL_BASED_OUTPATIENT_CLINIC_OR_DEPARTMENT_OTHER)
Admission: EM | Admit: 2015-10-25 | Discharge: 2015-10-26 | Disposition: A | Discharge status: Home / Self Care | Payer: Medicare Other | Attending: Emergency Medicine | Admitting: Emergency Medicine

## 2015-10-25 ENCOUNTER — Encounter (HOSPITAL_BASED_OUTPATIENT_CLINIC_OR_DEPARTMENT_OTHER): Payer: Self-pay | Admitting: Emergency Medicine

## 2015-10-25 DIAGNOSIS — E109 Type 1 diabetes mellitus without complications: Secondary | ICD-10-CM | POA: Insufficient documentation

## 2015-10-25 DIAGNOSIS — M069 Rheumatoid arthritis, unspecified: Secondary | ICD-10-CM | POA: Insufficient documentation

## 2015-10-25 DIAGNOSIS — Z794 Long term (current) use of insulin: Secondary | ICD-10-CM | POA: Insufficient documentation

## 2015-10-25 DIAGNOSIS — R109 Unspecified abdominal pain: Secondary | ICD-10-CM | POA: Diagnosis present

## 2015-10-25 DIAGNOSIS — Z7982 Long term (current) use of aspirin: Secondary | ICD-10-CM | POA: Insufficient documentation

## 2015-10-25 DIAGNOSIS — K5900 Constipation, unspecified: Secondary | ICD-10-CM | POA: Diagnosis not present

## 2015-10-25 DIAGNOSIS — K5909 Other constipation: Secondary | ICD-10-CM | POA: Diagnosis not present

## 2015-10-25 NOTE — ED Notes (Signed)
Patient reports that she has not had a BM x 2 weeks.. She reports that she takes miralax 3 times a day and about 1 week ago she started having fluid, this week nothing is coming out. Patient states that she is passing gas, but reports distention

## 2015-10-26 ENCOUNTER — Emergency Department (HOSPITAL_BASED_OUTPATIENT_CLINIC_OR_DEPARTMENT_OTHER): Payer: Medicare Other

## 2015-10-26 ENCOUNTER — Encounter (HOSPITAL_BASED_OUTPATIENT_CLINIC_OR_DEPARTMENT_OTHER): Payer: Self-pay | Admitting: Emergency Medicine

## 2015-10-26 DIAGNOSIS — K5900 Constipation, unspecified: Secondary | ICD-10-CM | POA: Diagnosis not present

## 2015-10-26 MED ORDER — MAGNESIUM CITRATE PO SOLN
1.0000 | Freq: Once | ORAL | Status: AC
  Administered 2015-10-26: 1 via ORAL

## 2015-10-26 MED ORDER — SENNOSIDES-DOCUSATE SODIUM 8.6-50 MG PO TABS: 1.0000 | ORAL_TABLET | Freq: Two times a day (BID) | ORAL | Status: DC

## 2015-10-26 MED ORDER — MAGNESIUM CITRATE PO SOLN
ORAL | Status: AC
  Administered 2015-10-26: 1 via ORAL
  Filled 2015-10-26: qty 296

## 2015-10-26 MED ORDER — DOCUSATE SODIUM 100 MG PO CAPS: 100.0000 mg | ORAL_CAPSULE | Freq: Two times a day (BID) | ORAL | Status: DC

## 2015-10-26 NOTE — ED Notes (Addendum)
Alert, NAD, calm, interactive, no dyspnea noted, resps e/u, skin W&D, family at [facility].

## 2015-10-26 NOTE — ED Notes (Addendum)
Dr. Palumbo at [facility]. Pt seen by EDP prior to RN assessment, see MD notes, pending orders.  

## 2015-10-26 NOTE — Discharge Instructions (Signed)
High-Fiber Diet  Fiber, also called dietary fiber, is a type of carbohydrate found in fruits, vegetables, whole grains, and beans. A high-fiber diet can have many health benefits. Your health care provider may recommend a high-fiber diet to help:   Prevent constipation. Fiber can make your bowel movements more regular.   Lower your cholesterol.   Relieve hemorrhoids, uncomplicated diverticulosis, or irritable bowel syndrome.   Prevent overeating as part of a weight-loss plan.   Prevent heart disease, type 2 diabetes, and certain cancers.  WHAT IS MY PLAN?  The recommended daily intake of fiber includes:   38 grams for men under age 50.   30 grams for men over age 50.   25 grams for women under age 50.   21 grams for women over age 50.  You can get the recommended daily intake of dietary fiber by eating a variety of fruits, vegetables, grains, and beans. Your health care provider may also recommend a fiber supplement if it is not possible to get enough fiber through your diet.  WHAT DO I NEED TO KNOW ABOUT A HIGH-FIBER DIET?   Fiber supplements have not been widely studied for their effectiveness, so it is better to get fiber through food sources.   Always check the fiber content on thenutrition facts label of any prepackaged food. Look for foods that contain at least 5 grams of fiber per serving.   Ask your dietitian if you have questions about specific foods that are related to your condition, especially if those foods are not listed in the following section.   Increase your daily fiber consumption gradually. Increasing your intake of dietary fiber too quickly may cause bloating, cramping, or gas.   Drink plenty of water. Water helps you to digest fiber.  WHAT FOODS CAN I EAT?  Grains  Whole-grain breads. Multigrain cereal. Oats and oatmeal. Brown rice. Barley. Bulgur wheat. Millet. Bran muffins. Popcorn. Rye wafer crackers.  Vegetables  Sweet potatoes. Spinach. Kale. Artichokes. Cabbage. Broccoli.  Green peas. Carrots. Squash.  Fruits  Berries. Pears. Apples. Oranges. Avocados. Prunes and raisins. Dried figs.  Meats and Other Protein Sources  Navy, kidney, pinto, and soy beans. Split peas. Lentils. Nuts and seeds.  Dairy  Fiber-fortified yogurt.  Beverages  Fiber-fortified soy milk. Fiber-fortified orange juice.  Other  Fiber bars.  The items listed above may not be a complete list of recommended foods or beverages. Contact your dietitian for more options.  WHAT FOODS ARE NOT RECOMMENDED?  Grains  White bread. Pasta made with refined flour. White rice.  Vegetables  Fried potatoes. Canned vegetables. Well-cooked vegetables.   Fruits  Fruit juice. Cooked, strained fruit.  Meats and Other Protein Sources  Fatty cuts of meat. Fried poultry or fried fish.  Dairy  Milk. Yogurt. Cream cheese. Sour cream.  Beverages  Soft drinks.  Other  Cakes and pastries. Butter and oils.  The items listed above may not be a complete list of foods and beverages to avoid. Contact your dietitian for more information.  WHAT ARE SOME TIPS FOR INCLUDING HIGH-FIBER FOODS IN MY DIET?   Eat a wide variety of high-fiber foods.   Make sure that half of all grains consumed each day are whole grains.   Replace breads and cereals made from refined flour or white flour with whole-grain breads and cereals.   Replace white rice with brown rice, bulgur wheat, or millet.   Start the day with a breakfast that is high in fiber, such as a   cereal that contains at least 5 grams of fiber per serving.   Use beans in place of meat in soups, salads, or pasta.   Eat high-fiber snacks, such as berries, raw vegetables, nuts, or popcorn.     This information is not intended to replace advice given to you by your health care provider. Make sure you discuss any questions you have with your health care provider.     Document Released: 06/07/2005 Document Revised: 06/28/2014 Document Reviewed: 11/20/2013  Elsevier Interactive Patient Education 2016 Elsevier  Inc.  Constipation, Adult  Constipation is when a person has fewer than three bowel movements a week, has difficulty having a bowel movement, or has stools that are dry, hard, or larger than normal. As people grow older, constipation is more common. A low-fiber diet, not taking in enough fluids, and taking certain medicines may make constipation worse.   CAUSES    Certain medicines, such as antidepressants, pain medicine, iron supplements, antacids, and water pills.    Certain diseases, such as diabetes, irritable bowel syndrome (IBS), thyroid disease, or depression.    Not drinking enough water.    Not eating enough fiber-rich foods.    Stress or travel.    Lack of physical activity or exercise.    Ignoring the urge to have a bowel movement.    Using laxatives too much.   SIGNS AND SYMPTOMS    Having fewer than three bowel movements a week.    Straining to have a bowel movement.    Having stools that are hard, dry, or larger than normal.    Feeling full or bloated.    Pain in the lower abdomen.    Not feeling relief after having a bowel movement.   DIAGNOSIS   Your health care provider will take a medical history and perform a physical exam. Further testing may be done for severe constipation. Some tests may include:   A barium enema X-ray to examine your rectum, colon, and, sometimes, your small intestine.    A sigmoidoscopy to examine your lower colon.    A colonoscopy to examine your entire colon.  TREATMENT   Treatment will depend on the severity of your constipation and what is causing it. Some dietary treatments include drinking more fluids and eating more fiber-rich foods. Lifestyle treatments may include regular exercise. If these diet and lifestyle recommendations do not help, your health care provider may recommend taking over-the-counter laxative medicines to help you have bowel movements. Prescription medicines may be prescribed if over-the-counter medicines do not work.    HOME CARE INSTRUCTIONS    Eat foods that have a lot of fiber, such as fruits, vegetables, whole grains, and beans.   Limit foods high in fat and processed sugars, such as french fries, hamburgers, cookies, candies, and soda.    A fiber supplement may be added to your diet if you cannot get enough fiber from foods.    Drink enough fluids to keep your urine clear or pale yellow.    Exercise regularly or as directed by your health care provider.    Go to the restroom when you have the urge to go. Do not hold it.    Only take over-the-counter or prescription medicines as directed by your health care provider. Do not take other medicines for constipation without talking to your health care provider first.   SEEK IMMEDIATE MEDICAL CARE IF:    You have bright red blood in your stool.    Your constipation lasts   for more than 4 days or gets worse.    You have abdominal or rectal pain.    You have thin, pencil-like stools.    You have unexplained weight loss.  MAKE SURE YOU:    Understand these instructions.   Will watch your condition.   Will get help right away if you are not doing well or get worse.     This information is not intended to replace advice given to you by your health care provider. Make sure you discuss any questions you have with your health care provider.     Document Released: 03/05/2004 Document Revised: 06/28/2014 Document Reviewed: 03/19/2013  Elsevier Interactive Patient Education 2016 Elsevier Inc.

## 2015-10-26 NOTE — ED Provider Notes (Addendum)
CSN: [id number]     Arrival date & time 10/25/15  2343 History  By signing my name below, I, [facility], attest that this documentation has been prepared under the direction and in the presence of Laurance Heide, MD. Electronically Signed: Randell Patient, ED Scribe. 10/26/2015. 12:55 AM.   Chief Complaint  Patient presents with  . Abdominal Pain   Patient is a 60 y.o. female presenting with constipation. The history is provided by the patient. No language interpreter was used.  Constipation Severity:  Moderate Timing:  Constant Progression:  Unchanged Chronicity:  Recurrent Context: medication   Context: not dehydration   Stool description:  Watery and small Relieved by:  Nothing Worsened by:  Nothing tried Ineffective treatments:  Enemas Associated symptoms: flatus   Associated symptoms: no abdominal pain, no back pain, no dysuria, no fever, no nausea, no urinary retention and no vomiting   Risk factors: no recent illness    HPI Comments: KAYLENE DAWN is a 60 y.o. female with an hx of IDDM, GERD, RA, hypercholesterolemia, and chronic constipation who presents to the Emergency Department complaining of constant, moderate constipation onset 2 weeks ago. Pt states that she has not had a BM in the past 2 weeks. She reports increased flatulence without difficulty and mildly, painful abdominal distension for the same time period. She states that she takes Miralax TID regularly, drinks 40 oz of water daily, and eats a diet rich in vegetables. She has used Fleet's Enema 2 days ago that produced a brown fluid from her rectum and taken Linsess without relief. Per pt, she has been taking tamoxifen for the past 2 weeks and noted that one of the side effects is constipation. She notes similar symptoms in the past that resolved with at home treatment. Denies fever or any other symptoms currently.  Past Medical History  Diagnosis Date  . IDDM (insulin dependent diabetes mellitus) (HCC)  1969  . Migraine headache   . Abnormal ECG   . Hypercholesterolemia   . Sjoegren syndrome (HCC)   . Otosclerosis   . S/P cardiac cath August 2013    Normal coronaries and normal LV function.  . Neuropathy (HCC)   . Osteoporosis     10/2012  . RA (rheumatoid arthritis) (HCC) 2000  . Cataract     . Cateracts bil eyes removed  . Neuromuscular disorder (HCC)     bil legs & feet - neuropathy  . Anemia   . Anxiety   . GERD (gastroesophageal reflux disease)   . Heart murmur    Past Surgical History  Procedure Laterality Date  . Breast lumpectomy  1995  . Carpal tunnel release  2000  . Toe surgery  2000    bone removed  . Cesarean section  1987  . Lymph node dissection  2002  . Hysteroscopy  02/2001    and D & C  . Cataract extraction Right 09-05-12  . Cataract extraction Left 3/15  . Eye surgery      diabetic retinopathy   . Colonoscopy     Family History  Problem Relation Age of Onset  . Breast cancer Mother 102  . Colon polyps Mother     unspecified number  . Breast cancer Maternal Grandmother 69  . Stroke Father 44  . Rheum arthritis Sister   . Breast cancer Sister 48    lump; (-)GT (breast/ova ca panel, [facility])  . Skin cancer Maternal Grandfather     unspecified type  . Prostate cancer Maternal  Grandfather     dx. late 69s  . Breast cancer Other     dx. in their late 33s  . Breast cancer Other     dx. 78-79  . Breast cancer Cousin     bilateral; dx. late 88s  . Colon cancer Neg Hx   . Esophageal cancer Neg Hx   . Rectal cancer Neg Hx   . Stomach cancer Neg Hx    Social History  Substance Use Topics  . Smoking status: Never Smoker   . Smokeless tobacco: Never Used  . Alcohol Use: 2.4 oz/week    2 Standard drinks or equivalent, 2 Shots of liquor per week     Comment: maybe 1-2 cocktails per wk   OB History    Gravida Para Term Preterm AB TAB SAB Ectopic Multiple Living   1 1 1       1      Review of Systems  Constitutional: Negative for fever.   Gastrointestinal: Positive for constipation, abdominal distention and flatus. Negative for nausea, vomiting, abdominal pain and blood in stool.  Genitourinary: Negative for dysuria and difficulty urinating.  Musculoskeletal: Negative for back pain.  All other systems reviewed and are negative.   Allergies  Ciprofloxacin and Penicillins  Home Medications   Prior to Admission medications   Medication Sig Start Date End Date Taking? Authorizing Provider  aspirin 81 MG tablet Take 81 mg by mouth daily.    Historical Provider, MD  calcium carbonate (OS-CAL) 600 MG TABS Take 600 mg by mouth daily.    Historical Provider, MD  cyclobenzaprine (FLEXERIL) 10 MG tablet Take 10 mg by mouth 3 (three) times daily as needed for muscle spasms. Reported on 07/03/2015    Historical Provider, MD  cycloSPORINE (RESTASIS) 0.05 % ophthalmic emulsion Place 1 drop into both eyes 2 (two) times daily.    Historical Provider, MD  DULoxetine (CYMBALTA) 60 MG capsule Take 60 mg by mouth daily.    Historical Provider, MD  Etanercept (ENBREL [place]) Inject into the skin once a week.    Historical Provider, MD  furosemide (LASIX) 20 MG tablet Take 20 mg by mouth every other day.    Historical Provider, MD  gabapentin (NEURONTIN) 300 MG capsule Take 1 capsule (300 mg total) by mouth at bedtime. 10/14/15   10/16/15, MD  insulin aspart (NOVOLOG) 100 UNIT/ML injection Insulin pump    Historical Provider, MD  Linaclotide (LINZESS) 145 MCG CAPS capsule Take 145 mcg by mouth daily.    Historical Provider, MD  losartan (COZAAR) 25 MG tablet Take 25 mg by mouth daily.    Historical Provider, MD  methotrexate (RHEUMATREX) 2.5 MG tablet Take 20 mg by mouth once a week.    Historical Provider, MD  Multiple Vitamin (MULTIVITAMIN) tablet Take 1 tablet by mouth daily.    Historical Provider, MD  POLYETHYLENE GLYCOL 3350 PO Take by mouth daily.    Historical Provider, MD  pravastatin (PRAVACHOL) 20 MG tablet Take 20 mg by mouth  daily.    Historical Provider, MD  SUMAtriptan (IMITREX) 100 MG tablet Take 100 mg by mouth every 2 (two) hours as needed.    Historical Provider, MD  tamoxifen (NOLVADEX) 20 MG tablet Take 1 tablet (20 mg total) by mouth daily. 10/14/15 11/13/15  11/15/15, MD  topiramate (TOPAMAX) 200 MG tablet Take 200 mg by mouth daily.    Historical Provider, MD  vitamin B-12 (CYANOCOBALAMIN) 1000 MCG tablet Take 1,000 mcg by mouth daily.  Reported on 07/07/2015    Historical Provider, MD   BP 145/53 mmHg  Pulse 96  Temp(Src) 98.1 F (36.7 C) (Oral)  Resp 18  Ht 5\' 4"  (1.626 m)  Wt 128 lb (58.06 kg)  BMI 21.96 kg/m2  SpO2 100%  LMP 09/19/2008 Physical Exam  Constitutional: She is oriented to person, place, and time. She appears well-developed and well-nourished. No distress.  HENT:  Head: Normocephalic and atraumatic.  Mouth/Throat: Oropharynx is clear and moist and mucous membranes are normal. No oropharyngeal exudate.  Eyes: Conjunctivae and EOM are normal. Pupils are equal, round, and reactive to light.  Neck: Trachea normal and normal range of motion. Neck supple. No JVD present. Carotid bruit is not present.  Cardiovascular: Normal rate and regular rhythm.  Exam reveals no gallop and no friction rub.   No murmur heard. Pulmonary/Chest: Effort normal and breath sounds normal. No stridor. She has no wheezes. She has no rales.  Lungs CTA bilaterally.  Abdominal: Soft. Bowel sounds are normal. She exhibits no mass. There is no tenderness. There is no rebound and no guarding.  Musculoskeletal: Normal range of motion.  Lymphadenopathy:    She has no cervical adenopathy.  Neurological: She is alert and oriented to person, place, and time. She has normal reflexes. No cranial nerve deficit. She exhibits normal muscle tone. Coordination normal.  Skin: Skin is warm and dry. She is not diaphoretic.  Psychiatric: She has a normal mood and affect. Her behavior is normal.  Nursing note and vitals  reviewed.   ED Course  Procedures   DIAGNOSTIC STUDIES: Oxygen Saturation is 100% on RA, normal by my interpretation.    COORDINATION OF CARE: 12:12 AM Will order abdomen and chest x-ray. Discussed treatment plan with pt at bedside and pt agreed to plan.  12:55 AM Returned to discuss abdominal imaging results. Will order magnesium citrate.  Imaging Review Dg Abd Acute W/chest  10/26/2015  CLINICAL DATA:  Constipation. EXAM: DG ABDOMEN ACUTE W/ 1V CHEST COMPARISON:  None. FINDINGS: There is a generous volume colonic stool. No evidence of bowel obstruction or perforation. The upright view of the chest is negative for acute cardiopulmonary abnormality. IMPRESSION: Generous colonic stool volume. Negative for obstruction or perforation. Electronically Signed   By: Ellery Plunk M.D.   On: 10/26/2015 00:35   I have personally reviewed and evaluated these images as part of my medical decision-making.   MDM   Final diagnoses:  None   Filed Vitals:   10/25/15 2353  BP: 145/53  Pulse: 96  Temp: 98.1 F (36.7 C)  Resp: 18   Results for orders placed or performed in visit on 07/03/15  Glucose, capillary  Result Value Ref Range   Glucose-Capillary 64 (L) 65 - 99 mg/dL  Glucose, capillary  Result Value Ref Range   Glucose-Capillary 94 65 - 99 mg/dL  Glucose, capillary  Result Value Ref Range   Glucose-Capillary 167 (H) 65 - 99 mg/dL   Dg Abd Acute W/chest  10/26/2015  CLINICAL DATA:  Constipation. EXAM: DG ABDOMEN ACUTE W/ 1V CHEST COMPARISON:  None. FINDINGS: There is a generous volume colonic stool. No evidence of bowel obstruction or perforation. The upright view of the chest is negative for acute cardiopulmonary abnormality. IMPRESSION: Generous colonic stool volume. Negative for obstruction or perforation. Electronically Signed   By: Ellery Plunk M.D.   On: 10/26/2015 00:35    Exam and vitals are benign and reassuring.  Patient needs to increase water in diet and  dietary fiber and discuss medications with her PMD as several can cause constipation.  Magnesium citrate x1 and then start a bowel regimen.  Follow up with your PMD and GI.  Strict return precautions given  I personally performed the services described in this documentation, which was scribed in my presence. The recorded information has been reviewed and is accurate.      Cy Blamer, MD 10/26/15 0109  Taheera Thomann, MD 10/26/15 0110

## 2015-11-05 DIAGNOSIS — Z1231 Encounter for screening mammogram for malignant neoplasm of breast: Secondary | ICD-10-CM | POA: Diagnosis not present

## 2015-11-05 DIAGNOSIS — Z803 Family history of malignant neoplasm of breast: Secondary | ICD-10-CM | POA: Diagnosis not present

## 2015-11-11 DIAGNOSIS — M25561 Pain in right knee: Secondary | ICD-10-CM | POA: Diagnosis not present

## 2015-11-11 DIAGNOSIS — M79671 Pain in right foot: Secondary | ICD-10-CM | POA: Diagnosis not present

## 2015-11-13 DIAGNOSIS — E103591 Type 1 diabetes mellitus with proliferative diabetic retinopathy without macular edema, right eye: Secondary | ICD-10-CM | POA: Diagnosis not present

## 2015-11-13 DIAGNOSIS — E1039 Type 1 diabetes mellitus with other diabetic ophthalmic complication: Secondary | ICD-10-CM | POA: Diagnosis not present

## 2015-11-13 DIAGNOSIS — H35043 Retinal micro-aneurysms, unspecified, bilateral: Secondary | ICD-10-CM | POA: Diagnosis not present

## 2015-11-13 DIAGNOSIS — E103592 Type 1 diabetes mellitus with proliferative diabetic retinopathy without macular edema, left eye: Secondary | ICD-10-CM | POA: Diagnosis not present

## 2015-11-13 DIAGNOSIS — H4321 Crystalline deposits in vitreous body, right eye: Secondary | ICD-10-CM | POA: Diagnosis not present

## 2015-11-20 ENCOUNTER — Encounter: Payer: Self-pay | Admitting: Obstetrics and Gynecology

## 2015-11-20 DIAGNOSIS — E1040 Type 1 diabetes mellitus with diabetic neuropathy, unspecified: Secondary | ICD-10-CM | POA: Diagnosis not present

## 2015-12-03 DIAGNOSIS — M791 Myalgia: Secondary | ICD-10-CM | POA: Diagnosis not present

## 2015-12-03 DIAGNOSIS — G43719 Chronic migraine without aura, intractable, without status migrainosus: Secondary | ICD-10-CM | POA: Diagnosis not present

## 2015-12-03 DIAGNOSIS — R51 Headache: Secondary | ICD-10-CM | POA: Diagnosis not present

## 2015-12-03 DIAGNOSIS — M542 Cervicalgia: Secondary | ICD-10-CM | POA: Diagnosis not present

## 2015-12-03 DIAGNOSIS — G518 Other disorders of facial nerve: Secondary | ICD-10-CM | POA: Diagnosis not present

## 2015-12-11 DIAGNOSIS — Z79899 Other long term (current) drug therapy: Secondary | ICD-10-CM | POA: Diagnosis not present

## 2015-12-11 DIAGNOSIS — B351 Tinea unguium: Secondary | ICD-10-CM | POA: Diagnosis not present

## 2015-12-11 DIAGNOSIS — L03032 Cellulitis of left toe: Secondary | ICD-10-CM | POA: Diagnosis not present

## 2015-12-11 DIAGNOSIS — Z6822 Body mass index (BMI) 22.0-22.9, adult: Secondary | ICD-10-CM | POA: Diagnosis not present

## 2016-01-05 ENCOUNTER — Other Ambulatory Visit: Payer: Self-pay | Admitting: *Deleted

## 2016-01-05 DIAGNOSIS — Z1379 Encounter for other screening for genetic and chromosomal anomalies: Secondary | ICD-10-CM

## 2016-01-05 DIAGNOSIS — E1040 Type 1 diabetes mellitus with diabetic neuropathy, unspecified: Secondary | ICD-10-CM | POA: Diagnosis not present

## 2016-01-05 DIAGNOSIS — N6019 Diffuse cystic mastopathy of unspecified breast: Secondary | ICD-10-CM

## 2016-01-05 DIAGNOSIS — Z9189 Other specified personal risk factors, not elsewhere classified: Secondary | ICD-10-CM

## 2016-01-06 ENCOUNTER — Other Ambulatory Visit (HOSPITAL_BASED_OUTPATIENT_CLINIC_OR_DEPARTMENT_OTHER): Payer: Medicare Other

## 2016-01-06 DIAGNOSIS — Z1379 Encounter for other screening for genetic and chromosomal anomalies: Secondary | ICD-10-CM

## 2016-01-06 DIAGNOSIS — Z9189 Other specified personal risk factors, not elsewhere classified: Secondary | ICD-10-CM

## 2016-01-06 DIAGNOSIS — D721 Eosinophilia: Secondary | ICD-10-CM

## 2016-01-06 DIAGNOSIS — N6019 Diffuse cystic mastopathy of unspecified breast: Secondary | ICD-10-CM

## 2016-01-06 LAB — COMPREHENSIVE METABOLIC PANEL
ALT: 50 U/L (ref 0–55)
AST: 60 U/L — ABNORMAL HIGH (ref 5–34)
Albumin: 3.7 g/dL (ref 3.5–5.0)
Alkaline Phosphatase: 74 U/L (ref 40–150)
Anion Gap: 9 mEq/L (ref 3–11)
BUN: 14.8 mg/dL (ref 7.0–26.0)
CO2: 25 mEq/L (ref 22–29)
Calcium: 9.7 mg/dL (ref 8.4–10.4)
Chloride: 107 mEq/L (ref 98–109)
Creatinine: 1 mg/dL (ref 0.6–1.1)
EGFR: 61 mL/min/{1.73_m2} — ABNORMAL LOW (ref >90)
Glucose: 175 mg/dl — ABNORMAL HIGH (ref 70–140)
Potassium: 4.1 mEq/L (ref 3.5–5.1)
Sodium: 141 mEq/L (ref 136–145)
Total Bilirubin: 0.46 mg/dL (ref 0.20–1.20)
Total Protein: 6.9 g/dL (ref 6.4–8.3)

## 2016-01-06 LAB — CBC WITH DIFFERENTIAL/PLATELET
BASO%: 1.1 % (ref 0.0–2.0)
Basophils Absolute: 0.1 10*3/uL (ref 0.0–0.1)
EOS%: 13.3 % — ABNORMAL HIGH (ref 0.0–7.0)
Eosinophils Absolute: 0.6 10*3/uL — ABNORMAL HIGH (ref 0.0–0.5)
HCT: 39.3 % (ref 34.8–46.6)
HGB: 12.8 g/dL (ref 11.6–15.9)
LYMPH%: 31.4 % (ref 14.0–49.7)
MCH: 30.3 pg (ref 25.1–34.0)
MCHC: 32.6 g/dL (ref 31.5–36.0)
MCV: 92.9 fL (ref 79.5–101.0)
MONO#: 0.3 10*3/uL (ref 0.1–0.9)
MONO%: 6.4 % (ref 0.0–14.0)
NEUT#: 2.1 10*3/uL (ref 1.5–6.5)
NEUT%: 47.8 % (ref 38.4–76.8)
Platelets: 279 10*3/uL (ref 145–400)
RBC: 4.23 10*6/uL (ref 3.70–5.45)
RDW: 15 % — ABNORMAL HIGH (ref 11.2–14.5)
WBC: 4.4 10*3/uL (ref 3.9–10.3)
lymph#: 1.4 10*3/uL (ref 0.9–3.3)

## 2016-01-07 DIAGNOSIS — H04123 Dry eye syndrome of bilateral lacrimal glands: Secondary | ICD-10-CM | POA: Diagnosis not present

## 2016-01-13 ENCOUNTER — Other Ambulatory Visit: Payer: Medicare Other

## 2016-01-13 ENCOUNTER — Telehealth: Payer: Self-pay | Admitting: Oncology

## 2016-01-13 ENCOUNTER — Ambulatory Visit (HOSPITAL_BASED_OUTPATIENT_CLINIC_OR_DEPARTMENT_OTHER): Payer: Medicare Other | Admitting: Oncology

## 2016-01-13 VITALS — BP 148/60 | HR 89 | Temp 98.4°F | Wt 126.8 lb

## 2016-01-13 DIAGNOSIS — D721 Eosinophilia: Secondary | ICD-10-CM

## 2016-01-13 DIAGNOSIS — M545 Low back pain: Secondary | ICD-10-CM

## 2016-01-13 DIAGNOSIS — M3500 Sicca syndrome, unspecified: Secondary | ICD-10-CM | POA: Diagnosis not present

## 2016-01-13 DIAGNOSIS — M069 Rheumatoid arthritis, unspecified: Secondary | ICD-10-CM | POA: Diagnosis not present

## 2016-01-13 DIAGNOSIS — Z9189 Other specified personal risk factors, not elsewhere classified: Secondary | ICD-10-CM

## 2016-01-13 DIAGNOSIS — E1140 Type 2 diabetes mellitus with diabetic neuropathy, unspecified: Secondary | ICD-10-CM

## 2016-01-13 NOTE — Telephone Encounter (Signed)
appt made and avs pinted. Mri to be scheduled by central scheduling

## 2016-01-13 NOTE — Progress Notes (Signed)
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 It is best to discuss these unrelated problem separately:  Eosinophilia: We have a CBC with differential dating back to 01/26/2012. This showed WBC 6.1, Hb 11.9 and platelets 248. Absolute eosinophil count was 1.0. At that time the patient was worked up for atypical chest [pain, with an equivocal Myoview. She underwent catheterization 01/28/2012 which showed normal coronaries. Chest X-ray 01/26/2012 showed no infiltrates. There are multiple other CBC reports, all normal, but w/o differentials. We just repeated a CBC/diff here, which shows WBC 5.7, Hb 13.3, platelets 265, and abs eosiniphil count  1.2 (normal to 0.5 ).  The patient has a significant rheumatoid history including rheumatoid arthritis and Sjogren's syndrome, followed by Dr Tomi Likens. She tells me the arthritis is well controlled. Currently she denies rash, difficulty swallowing or painful swallowing, gastritis or history of ulcers; denies cough, phlegm production, pleurisy, or history of repeated URIs or PNA; or changes in bowel habits. She underwent biopsy of an anterior neck  mass 10/13/2009 which showed only reactive lymphoid hyperplasia([id number]). She denies travel outside the country, fevers, unexplained weight loss of unexplained fatigue.  High Risk for Breast Cancer: The patient's mother, currently 41, was diagnosed with breast cancer at age 50. She also has a history of colon polyps. The patient's father is 57, with no history of cancer. The patient's only full sister, was diagnosed with breast cancer at age 55. She had a negative test through the breast or ovarian cancer panel. Further on the mother's side there is one cousin with "female cancer" diagnosed in her 31s, and another with cervical cancer in the 18s. The patient's maternal grandmother was diagnosed with breast cancer at age 53. That grandmother had 3 sisters 2 of whom were diagnosed with breast cancers in their 2s. (The third sister died in her 77s from surgical complications not related to cancer).-- In addition, the patient has breast density category C/D. Her breasts are lumpy making screening exam additionally difficult.  Biopsy of several right breast masses 10/09/1998 showed no evidence of malignancy ([id number])  Her subsequent history is as detailed below.  INTERVAL HISTORY: Grady returns today to the high risk clinic for follow-up of her breast cancer risk interventions and also her incidental eosinophilia. At the last visit here she was started on tamoxifen. 60 Unfortunately this significantly worsened her constipation so that for 2 weeks she did not have a bowel movement. She was seen in the emergency room, shown not to be obstructed, and given magnesium citrate. She tells me took 3 bottles for her to and stop. She did not stop the tamoxifen, but then again had no bowel movements for 2 weeks and had to go through similar procedure. At that time,  59 month after starting tamoxifen, she stopped it. Her bowel movements have gone back to normal.  In the interim she has had multiple other medication  changes and particularly has gone off the forteo.  REVIEW OF SYSTEMS: She is exercising regularly by walking her dogs. She is a somewhat worshiper but does go to the dermatologist twice a year. She continues with chronic constipation but since stopping the tamoxifen has a bowel movement every day or every other day at most on a regimen of MiraLAX 3 times a day to Senokot daily and 2 Colace daily. She describes herself is moderately fatigued. She has pain in her back her neck and her knees which is fairly constant. She has problems with dry eyes and of course she has her diabetic neuropathy to content with. She is anxious but not depressed. A detailed review of systems today was otherwise stable  PAST MEDICAL HISTORY: Past Medical History:  Diagnosis Date  . Abnormal ECG   . Anemia   . Anxiety   . Cataract    . Cateracts bil eyes removed  . GERD (gastroesophageal reflux disease)   . Heart murmur   . Hypercholesterolemia   . IDDM (insulin dependent diabetes mellitus) ([place]) 1969  . Migraine headache   . Neuromuscular disorder (HCC)    bil legs & feet - neuropathy  . Neuropathy ([place])   . Osteoporosis    10/2012  . Otosclerosis   . RA (rheumatoid arthritis) ([place]) 2000  . S/P cardiac cath August 2013   Normal coronaries and normal LV function.  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 BREAST LUMPECTOMY  1995  . CARPAL TUNNEL RELEASE  2000  . CATARACT EXTRACTION Right 09-05-12  . CATARACT EXTRACTION Left 3/15  . CESAREAN SECTION  1987  . COLONOSCOPY    . EYE SURGERY     diabetic retinopathy   . HYSTEROSCOPY  02/2001   and D & C  . LYMPH NODE DISSECTION  2002  . TOE SURGERY  2000   bone removed    FAMILY HISTORY Family History  Problem Relation Age of Onset  . Breast cancer Mother 25  . Colon polyps Mother     unspecified number  . Breast cancer Maternal Grandmother 92  . Stroke Father 18  . Rheum arthritis  Sister   . Breast cancer Sister 48    lump; (-)GT (breast/ova ca panel, [facility])  . Skin cancer Maternal Grandfather     unspecified type  . Prostate cancer Maternal Grandfather     dx. late 71s  . Breast cancer Other     dx. in their late 51s  . Breast cancer Other     dx. 78-79  . Breast cancer Cousin     bilateral; dx. late 33s  . Colon cancer Neg Hx   . Esophageal cancer Neg Hx   . Rectal cancer Neg Hx   . Stomach cancer Neg Hx    (see also history of present illness above).   GYNECOLOGIC HISTORY:  Patient's last menstrual period was 09/19/2008.  Menarche age 35, first live birth age 67, she is GX P1, with the change of life occurring at age 47. She did not take hormone replacement. 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 Her husband Luciana Axe works as a Games developer. More specifically he does interior  trim for [employer], so he does a lot of traveling. The patient's daughter is a Programme researcher, broadcasting/film/video and works in [place]. She got married in September 2016. The patient is not a church attender.     HEALTH MAINTENANCE: Social History  Substance Use Topics  . Smoking status: Never Smoker  . Smokeless tobacco: Never Used  . Alcohol use 2.4 oz/week    2 Standard drinks or equivalent, 2 Shots of liquor per week     Comment: maybe 1-2 cocktails per wk     Colonoscopy: 2012/ [facility]  PAP:  Bone density:   On forteo  Lipid panel:  Allergies  Allergen Reactions  . Ciprofloxacin Rash  . Penicillins Rash    Current Outpatient Prescriptions  Medication Sig Dispense Refill  . aspirin 81 MG tablet Take 81 mg by mouth daily.    . calcium carbonate (OS-CAL) 600 MG TABS Take 600 mg by mouth daily.    . cyclobenzaprine (FLEXERIL) 10 MG tablet Take 10 mg by mouth 3 (three) times daily as needed for muscle spasms. Reported on 07/03/2015    . cycloSPORINE (RESTASIS) 0.05 %  ophthalmic emulsion Place 1 drop into both eyes 2 (two) times daily.    Marland Kitchen docusate sodium (COLACE) 100 MG capsule Take 1 capsule (100 mg total) by mouth every 12 (twelve) hours. 60 capsule 0  . DULoxetine (CYMBALTA) 60 MG capsule Take 60 mg by mouth daily.    . Etanercept (ENBREL [place]) Inject into the skin once a week.    . furosemide (LASIX) 20 MG tablet Take 20 mg by mouth every other day.    . gabapentin (NEURONTIN) 300 MG capsule Take 1 capsule (300 mg total) by mouth at bedtime. 90 capsule 4  . insulin aspart (NOVOLOG) 100 UNIT/ML injection Insulin pump    . Linaclotide (LINZESS) 145 MCG CAPS capsule Take 145 mcg by mouth daily.    Marland Kitchen losartan (COZAAR) 25 MG tablet Take 25 mg by mouth daily.    . methotrexate (RHEUMATREX) 2.5 MG tablet Take 20 mg by mouth once a week.    . Multiple Vitamin (MULTIVITAMIN) tablet Take 1 tablet by mouth daily.    Marland Kitchen POLYETHYLENE GLYCOL 3350 PO Take by mouth daily.    . pravastatin (PRAVACHOL) 20 MG tablet Take 20 mg by mouth daily.    Marland Kitchen senna-docusate (SENOKOT-S) 8.6-50 MG tablet Take 1 tablet by mouth 2 (two) times daily. 60 tablet 0  . SUMAtriptan (IMITREX) 100 MG tablet Take 100 mg by mouth every 2 (two) hours as needed.    . topiramate (TOPAMAX) 200 MG tablet Take 200 mg by mouth daily.    . vitamin B-12 (CYANOCOBALAMIN) 1000 MCG tablet Take 1,000 mcg by mouth daily. Reported on 07/07/2015     No current facility-administered medications for this visit.     OBJECTIVE: Middle-aged white woman In no acute distress Vitals:   01/13/16 1305  BP: (!) 148/60  Pulse: 89  Temp: 98.4 F (36.9 C)     Body mass index is 21.77 kg/m.    ECOG FS:1 - Symptomatic but completely ambulatory  Sclerae unicteric, EOMs intact Oropharynx clear and moist No cervical or supraclavicular adenopathy, no axillary adenopathy Lungs no rales or rhonchi Heart regular rate and rhythm Abd soft, nontender, positive bowel sounds MSK no focal spinal tenderness, no upper extremity  lymphedema Neuro: nonfocal, well oriented, appropriate affect Breasts: Deferred Skin: significant actinic change  LAB RESULTS:  CMP     Component Value Date/Time  NA 141 01/06/2016 1005   K 4.1 01/06/2016 1005   CL 107 01/26/2012 1021   CO2 25 01/06/2016 1005   GLUCOSE 175 (H) 01/06/2016 1005   BUN 14.8 01/06/2016 1005   CREATININE 1.0 01/06/2016 1005   CALCIUM 9.7 01/06/2016 1005   PROT 6.9 01/06/2016 1005   ALBUMIN 3.7 01/06/2016 1005   AST 60 (H) 01/06/2016 1005   ALT 50 01/06/2016 1005   ALKPHOS 74 01/06/2016 1005   BILITOT 0.46 01/06/2016 1005   GFRNONAA >60 10/09/2009 1313   GFRAA  10/09/2009 1313    >60        The eGFR has been calculated using the MDRD equation. This calculation has not been validated in all clinical situations. eGFR's persistently <60 mL/min signify possible Chronic Kidney Disease.    INo results found for: SPEP, UPEP  Lab Results  Component Value Date   WBC 4.4 01/06/2016   NEUTROABS 2.1 01/06/2016   HGB 12.8 01/06/2016   HCT 39.3 01/06/2016   MCV 92.9 01/06/2016   PLT 279 01/06/2016      Chemistry      Component Value Date/Time   NA 141 01/06/2016 1005   K 4.1 01/06/2016 1005   CL 107 01/26/2012 1021   CO2 25 01/06/2016 1005   BUN 14.8 01/06/2016 1005   CREATININE 1.0 01/06/2016 1005      Component Value Date/Time   CALCIUM 9.7 01/06/2016 1005   ALKPHOS 74 01/06/2016 1005   AST 60 (H) 01/06/2016 1005   ALT 50 01/06/2016 1005   BILITOT 0.46 01/06/2016 1005       No results found for: LABCA2  No components found for: LABCA125  No results for input(s): INR in the last 168 hours.  Urinalysis    Component Value Date/Time   BILIRUBINUR n 08/02/2013 1542   PROTEINUR n 08/02/2013 1542   UROBILINOGEN negative 08/02/2013 1542   NITRITE n 08/02/2013 1542   LEUKOCYTESUR Negative 08/02/2013 1542    STUDIES: Mammography at [facility] 11/05/2015 found breast composition to be category D. there was no evidence of  malignancy  ASSESSMENT: 60 y.o. 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 This panel also includes deletion/duplication analysis (without sequencing) for EPCAM.  (2)  benign eosinophilia likely associated with rheumatoid arthritis or secondary to medication  (3) breast cancer high risk:  (a) intolerant of tamoxifen (constipation)  (b) to start screening intennsitifcation with yearly breast MRI Dec 2017    PLAN: Berneda did her best to take tamoxifen but it absolutely did not agree with her. While constipation is usually a minor side effect if it occurs at all, in her case it was devastating. There is no plan to give it another try.  At this point it would be reasonable to consider screening intensification. She had her mammography in June. We will write for breast MRI in December. That way she can have screening every 6 months and she will also have breast exam through her gynecologist and through her visits here..  On the eosinophil front I am delighted that her count is down to 0.6 (absolute), which is close to normal I suspect one of the medicines that was  discontinued may have been the cause. Of course there is some eosinophilia frequently seen with some rheumatoid disorders but in any rate this is if anything improved.  She will see me again in one year. She will have her yearly mammography before that visit. She knows to call for any problems that may develop before then.    Chauncey Cruel, MD   01/13/2016 1:34 PM Medical Oncology and Hematology [facility] [address] [place], [place]  [address] Tel. [phone]    Fax. [phone]

## 2016-01-20 DIAGNOSIS — R51 Headache: Secondary | ICD-10-CM | POA: Diagnosis not present

## 2016-01-20 DIAGNOSIS — G43719 Chronic migraine without aura, intractable, without status migrainosus: Secondary | ICD-10-CM | POA: Diagnosis not present

## 2016-01-20 DIAGNOSIS — M791 Myalgia: Secondary | ICD-10-CM | POA: Diagnosis not present

## 2016-01-20 DIAGNOSIS — G518 Other disorders of facial nerve: Secondary | ICD-10-CM | POA: Diagnosis not present

## 2016-01-20 DIAGNOSIS — M542 Cervicalgia: Secondary | ICD-10-CM | POA: Diagnosis not present

## 2016-02-02 DIAGNOSIS — E1040 Type 1 diabetes mellitus with diabetic neuropathy, unspecified: Secondary | ICD-10-CM | POA: Diagnosis not present

## 2016-02-11 DIAGNOSIS — E109 Type 1 diabetes mellitus without complications: Secondary | ICD-10-CM | POA: Diagnosis not present

## 2016-02-11 DIAGNOSIS — Z794 Long term (current) use of insulin: Secondary | ICD-10-CM | POA: Diagnosis not present

## 2016-02-12 DIAGNOSIS — M79641 Pain in right hand: Secondary | ICD-10-CM | POA: Diagnosis not present

## 2016-02-12 DIAGNOSIS — M25561 Pain in right knee: Secondary | ICD-10-CM | POA: Diagnosis not present

## 2016-02-12 DIAGNOSIS — Z09 Encounter for follow-up examination after completed treatment for conditions other than malignant neoplasm: Secondary | ICD-10-CM | POA: Diagnosis not present

## 2016-02-12 DIAGNOSIS — E1040 Type 1 diabetes mellitus with diabetic neuropathy, unspecified: Secondary | ICD-10-CM | POA: Diagnosis not present

## 2016-02-12 DIAGNOSIS — M0579 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 without organ or systems involvement: Secondary | ICD-10-CM | POA: Diagnosis not present

## 2016-02-16 ENCOUNTER — Other Ambulatory Visit: Payer: Self-pay | Admitting: Oncology

## 2016-02-16 DIAGNOSIS — Z803 Family history of malignant neoplasm of breast: Secondary | ICD-10-CM

## 2016-02-16 DIAGNOSIS — N6019 Diffuse cystic mastopathy of unspecified breast: Secondary | ICD-10-CM

## 2016-02-19 DIAGNOSIS — Z803 Family history of malignant neoplasm of breast: Secondary | ICD-10-CM | POA: Diagnosis not present

## 2016-02-19 DIAGNOSIS — Z23 Encounter for immunization: Secondary | ICD-10-CM | POA: Diagnosis not present

## 2016-02-19 DIAGNOSIS — M069 Rheumatoid arthritis, unspecified: Secondary | ICD-10-CM | POA: Diagnosis not present

## 2016-02-19 DIAGNOSIS — D6489 Other specified anemias: Secondary | ICD-10-CM | POA: Diagnosis not present

## 2016-02-19 DIAGNOSIS — M810 Age-related osteoporosis without current pathological fracture: Secondary | ICD-10-CM | POA: Diagnosis not present

## 2016-02-19 DIAGNOSIS — E1040 Type 1 diabetes mellitus with diabetic neuropathy, unspecified: Secondary | ICD-10-CM | POA: Diagnosis not present

## 2016-02-19 DIAGNOSIS — N08 Glomerular disorders in diseases classified elsewhere: Secondary | ICD-10-CM | POA: Diagnosis not present

## 2016-02-19 DIAGNOSIS — E1142 Type 2 diabetes mellitus with diabetic polyneuropathy: Secondary | ICD-10-CM | POA: Diagnosis not present

## 2016-02-19 DIAGNOSIS — I10 Essential (primary) hypertension: Secondary | ICD-10-CM | POA: Diagnosis not present

## 2016-02-19 DIAGNOSIS — E113593 Type 2 diabetes mellitus with proliferative diabetic retinopathy without macular edema, bilateral: Secondary | ICD-10-CM | POA: Diagnosis not present

## 2016-03-03 DIAGNOSIS — M542 Cervicalgia: Secondary | ICD-10-CM | POA: Diagnosis not present

## 2016-03-03 DIAGNOSIS — G518 Other disorders of facial nerve: Secondary | ICD-10-CM | POA: Diagnosis not present

## 2016-03-03 DIAGNOSIS — M791 Myalgia: Secondary | ICD-10-CM | POA: Diagnosis not present

## 2016-03-03 DIAGNOSIS — R51 Headache: Secondary | ICD-10-CM | POA: Diagnosis not present

## 2016-03-03 DIAGNOSIS — G43719 Chronic migraine without aura, intractable, without status migrainosus: Secondary | ICD-10-CM | POA: Diagnosis not present

## 2016-03-10 DIAGNOSIS — H04123 Dry eye syndrome of bilateral lacrimal glands: Secondary | ICD-10-CM | POA: Diagnosis not present

## 2016-03-17 DIAGNOSIS — E1040 Type 1 diabetes mellitus with diabetic neuropathy, unspecified: Secondary | ICD-10-CM | POA: Diagnosis not present

## 2016-03-19 ENCOUNTER — Ambulatory Visit: Payer: Medicare Other | Admitting: Nurse Practitioner

## 2016-03-22 DIAGNOSIS — L570 Actinic keratosis: Secondary | ICD-10-CM | POA: Diagnosis not present

## 2016-03-22 DIAGNOSIS — L300 Nummular dermatitis: Secondary | ICD-10-CM | POA: Diagnosis not present

## 2016-03-23 ENCOUNTER — Inpatient Hospital Stay: Admission: RE | Admit: 2016-03-23 | Payer: Medicare Other | Source: Ambulatory Visit

## 2016-03-24 DIAGNOSIS — Z79899 Other long term (current) drug therapy: Secondary | ICD-10-CM | POA: Diagnosis not present

## 2016-03-29 ENCOUNTER — Ambulatory Visit: Payer: Medicare Other | Admitting: Nurse Practitioner

## 2016-03-29 ENCOUNTER — Telehealth: Payer: Self-pay | Admitting: Nurse Practitioner

## 2016-03-29 ENCOUNTER — Encounter: Payer: Self-pay | Admitting: Nurse Practitioner

## 2016-03-29 NOTE — Telephone Encounter (Signed)
Patient cancelled her AEX for today due to her insurance has not authorized her visit yet. Patient declined to wait for receptionist to call insurance to check on status of request. Patient requests a call back once authorization received. I will follow up with patient's insurance and call her.  FYI only.

## 2016-03-30 NOTE — Telephone Encounter (Signed)
Called patient and left a message to call back. We have received authorization from [facility] for her AEX.

## 2016-04-05 ENCOUNTER — Ambulatory Visit
Admission: RE | Admit: 2016-04-05 | Discharge: 2016-04-05 | Disposition: A | Discharge status: Home / Self Care | Payer: Medicare Other | Source: Ambulatory Visit | Attending: Oncology | Admitting: Oncology

## 2016-04-05 DIAGNOSIS — Z803 Family history of malignant neoplasm of breast: Secondary | ICD-10-CM | POA: Diagnosis not present

## 2016-04-05 DIAGNOSIS — N6019 Diffuse cystic mastopathy of unspecified breast: Secondary | ICD-10-CM

## 2016-04-05 MED ORDER — GADOBENATE DIMEGLUMINE 529 MG/ML IV SOLN
10.0000 mL | Freq: Once | INTRAVENOUS | Status: AC | PRN
  Administered 2016-04-05: 10 mL via INTRAVENOUS

## 2016-04-05 NOTE — Telephone Encounter (Signed)
If unable to get through with phone call -can also send her a note.  OK to close encounter.

## 2016-04-13 ENCOUNTER — Telehealth: Payer: Self-pay | Admitting: Nurse Practitioner

## 2016-04-13 NOTE — Telephone Encounter (Signed)
LMTCB about canceled appt/rd °

## 2016-04-20 DIAGNOSIS — E1040 Type 1 diabetes mellitus with diabetic neuropathy, unspecified: Secondary | ICD-10-CM | POA: Diagnosis not present

## 2016-04-21 DIAGNOSIS — E1040 Type 1 diabetes mellitus with diabetic neuropathy, unspecified: Secondary | ICD-10-CM | POA: Diagnosis not present

## 2016-05-04 ENCOUNTER — Ambulatory Visit: Payer: Medicare Other | Admitting: Nurse Practitioner

## 2016-05-10 DIAGNOSIS — G518 Other disorders of facial nerve: Secondary | ICD-10-CM | POA: Diagnosis not present

## 2016-05-10 DIAGNOSIS — R51 Headache: Secondary | ICD-10-CM | POA: Diagnosis not present

## 2016-05-10 DIAGNOSIS — M542 Cervicalgia: Secondary | ICD-10-CM | POA: Diagnosis not present

## 2016-05-10 DIAGNOSIS — G43719 Chronic migraine without aura, intractable, without status migrainosus: Secondary | ICD-10-CM | POA: Diagnosis not present

## 2016-05-10 DIAGNOSIS — M791 Myalgia: Secondary | ICD-10-CM | POA: Diagnosis not present

## 2016-05-19 ENCOUNTER — Other Ambulatory Visit: Payer: Self-pay | Admitting: Rheumatology

## 2016-05-19 MED ORDER — ETANERCEPT 50 MG/ML ~~LOC~~ SOAJ: 50.0000 mg | SUBCUTANEOUS | 0 refills | Status: DC

## 2016-05-19 NOTE — Telephone Encounter (Signed)
02/12/16 last visit Next visit Labs WNL 03/24/16 TB neg July 2017 Ok to refill per Dr Corliss Skains

## 2016-05-19 NOTE — Telephone Encounter (Signed)
Patient needs a refill of Enbrell sent to [facility]

## 2016-05-20 ENCOUNTER — Telehealth: Payer: Self-pay | Admitting: Rheumatology

## 2016-05-20 NOTE — Telephone Encounter (Signed)
-----   Message from Caffie Damme, RT sent at 05/19/2016 12:38 PM EST ----- Needs 6 m follow up please/ last visit Sept

## 2016-05-20 NOTE — Telephone Encounter (Signed)
LMOM for patient to call back to schedule appointment.  

## 2016-05-22 DIAGNOSIS — E1040 Type 1 diabetes mellitus with diabetic neuropathy, unspecified: Secondary | ICD-10-CM | POA: Diagnosis not present

## 2016-05-31 ENCOUNTER — Other Ambulatory Visit: Payer: Self-pay | Admitting: Rheumatology

## 2016-05-31 NOTE — Telephone Encounter (Signed)
Patient is requesting refill of Enbrel be sent to [facility]. Patient states they have not received it yet.

## 2016-06-01 NOTE — Telephone Encounter (Signed)
Left message to advise patient we will be faxing prescription to pharmacy.

## 2016-06-03 ENCOUNTER — Other Ambulatory Visit: Payer: Self-pay | Admitting: Rheumatology

## 2016-06-03 NOTE — Telephone Encounter (Signed)
ok 

## 2016-06-03 NOTE — Telephone Encounter (Signed)
Patient is requesting refill of methotrexate be sent to [facility] in [address] Patient states the pharmacy has sent over a request twice for the refill.

## 2016-06-03 NOTE — Telephone Encounter (Signed)
02/12/16 last visit Next visit: 08/24/16 Labs WNL 03/24/16 TB neg July 2017  Okay to refill MTX?

## 2016-06-04 MED ORDER — METHOTREXATE SODIUM CHEMO INJECTION 50 MG/2ML: 20.0000 mg | INTRAMUSCULAR | 0 refills | Status: DC

## 2016-06-09 DIAGNOSIS — H04123 Dry eye syndrome of bilateral lacrimal glands: Secondary | ICD-10-CM | POA: Diagnosis not present

## 2016-06-22 ENCOUNTER — Ambulatory Visit (INDEPENDENT_AMBULATORY_CARE_PROVIDER_SITE_OTHER): Payer: Medicare Other | Admitting: Nurse Practitioner

## 2016-06-22 ENCOUNTER — Encounter: Payer: Self-pay | Admitting: Nurse Practitioner

## 2016-06-22 VITALS — BP 126/64 | HR 68 | Ht 64.25 in | Wt 130.0 lb

## 2016-06-22 DIAGNOSIS — Z803 Family history of malignant neoplasm of breast: Secondary | ICD-10-CM | POA: Diagnosis not present

## 2016-06-22 DIAGNOSIS — Z01419 Encounter for gynecological examination (general) (routine) without abnormal findings: Secondary | ICD-10-CM | POA: Diagnosis not present

## 2016-06-22 DIAGNOSIS — R1030 Lower abdominal pain, unspecified: Secondary | ICD-10-CM

## 2016-06-22 DIAGNOSIS — Z0000 Encounter for general adult medical examination without abnormal findings: Secondary | ICD-10-CM | POA: Diagnosis not present

## 2016-06-22 NOTE — Patient Instructions (Addendum)

## 2016-06-22 NOTE — Progress Notes (Signed)
Patient ID: [name], female   DOB: 1956/02/06, 61 y.o.   MRN: [id number]  61 y.o. G54P1001 Married  Caucasian Fe here for annual exam.  [place]: of breast and other cancers of female genital organs.   BRCA testing was negative 04/2015. Unable  to take Tamoxifen secondary to horrible constipation. Now recommendations are for breast MRI yearly due to strong [facility] of breast cancer.  Last MRI done 04/05/16.  Feeling well.  HGB AIC was 7.8 in 7/17.  Getting a new pump that well read the blood sugar.  She is very concerned about lower pelvic pressure and pain that is intermittent.  Pain is described as sharp for a few seconds then dull achy.   She at first thought was due to constipation .  But then symptoms occurred when bowels were normal. She denies bloating or GERD.  Denies urinary symptoms. Pain is not related to positional changes.  Not associated with N/V, fever/ chills.  Now some vaginal discharge that is yellow in color and slight odorous.  Not related to SA.  Patient's last menstrual period was 09/19/2008.          Sexually active: Yes.    The current method of family planning is post menopausal status.    Exercising: Yes.    walking and yoga Smoker:  no  Health Maintenance: Pap: 03/18/15, Negative with negative HR HPV MMG: 11/05/15, 3D, Bi-Rads 1: Negative MRI negative 10/17 Colonoscopy: 07/03/15, Normal, repeat in 10 years BMD: 09/2015, Dr Forde Dandy, Osteoporosis - on Forteo in the past TDaP: 2015 Shingles: 2017 Pneumonia: Not indicated due to age Hep C and HIV: will discuss with Dr. Forde Dandy Labs: Dr. Forde Dandy and rheumatology   reports that she has never smoked. She has never used smokeless tobacco. She reports that she drinks about 2.4 oz of alcohol per week . She reports that she does not use drugs.  Past Medical History:  Diagnosis Date  . Abnormal ECG   . Anemia   . Anxiety   . Cataract    . Cateracts bil eyes removed  . GERD (gastroesophageal reflux disease)   . Heart murmur   .  Hypercholesterolemia   . IDDM (insulin dependent diabetes mellitus) ([place]) 1969  . Migraine headache   . Neuromuscular disorder (HCC)    bil legs & feet - neuropathy  . Neuropathy ([place])   . Osteoporosis    10/2012  . Otosclerosis   . RA (rheumatoid arthritis) ([place]) 2000  . S/P cardiac cath August 2013   Normal coronaries and normal LV function.  . Sjoegren syndrome [facility])     Past Surgical History:  Procedure Laterality Date  . BREAST LUMPECTOMY  1995  . CARPAL TUNNEL RELEASE  2000  . CATARACT EXTRACTION Right 09-05-12  . CATARACT EXTRACTION Left 3/15  . CESAREAN SECTION  1987  . COLONOSCOPY    . EYE SURGERY     diabetic retinopathy   . HYSTEROSCOPY  02/2001   and D & C  . LYMPH NODE DISSECTION  2002  . TOE SURGERY  2000   bone removed    Current Outpatient Prescriptions  Medication Sig Dispense Refill  . aspirin 81 MG tablet Take 81 mg by mouth daily.    . calcium carbonate (OS-CAL) 600 MG TABS Take 600 mg by mouth daily.    . cyclobenzaprine (FLEXERIL) 10 MG tablet Take 10 mg by mouth 3 (three) times daily as needed for muscle spasms. Reported on 07/03/2015    . cycloSPORINE (  RESTASIS) 0.05 % ophthalmic emulsion Place 1 drop into both eyes 2 (two) times daily.    . DULoxetine (CYMBALTA) 60 MG capsule Take 60 mg by mouth daily.    Marland Kitchen etanercept (ENBREL SURECLICK) 50 MG/ML injection Inject 0.98 mLs (50 mg total) into the skin once a week. 11.74 mL 0  . furosemide (LASIX) 20 MG tablet Take 20 mg by mouth every other day.    . insulin aspart (NOVOLOG) 100 UNIT/ML injection Insulin pump    . losartan (COZAAR) 25 MG tablet Take 25 mg by mouth daily.    . methotrexate 50 MG/2ML injection Inject 0.8 mLs (20 mg total) into the skin once a week. 10 mL 0  . Multiple Vitamin (MULTIVITAMIN) tablet Take 1 tablet by mouth daily.    Marland Kitchen POLYETHYLENE GLYCOL 3350 PO Take by mouth daily.    . pravastatin (PRAVACHOL) 20 MG tablet Take 20 mg by mouth daily.    . SUMAtriptan (IMITREX) 100 MG tablet  Take 100 mg by mouth every 2 (two) hours as needed.     No current facility-administered medications for this visit.     Family History  Problem Relation Age of Onset  . Breast cancer Mother 28  . Colon polyps Mother     unspecified number  . Breast cancer Maternal Grandmother 59  . Stroke Father 85  . Rheum arthritis Sister   . Breast cancer Sister 48    lump; (-)GT (breast/ova ca panel, GeneDx)  . Skin cancer Maternal Grandfather     unspecified type  . Prostate cancer Maternal Grandfather     dx. late 4s  . Breast cancer Other     dx. in their late 21s  . Breast cancer Other     dx. 78-79  . Breast cancer Cousin     bilateral; dx. late 59s  . Colon cancer Neg Hx   . Esophageal cancer Neg Hx   . Rectal cancer Neg Hx   . Stomach cancer Neg Hx     ROS:  Pertinent items are noted in HPI.  Otherwise, a comprehensive ROS was negative.  Exam:   BP 126/64 (BP Location: Right Arm, Patient Position: Sitting, Cuff Size: Normal)   Pulse 68   Ht 5' 4.25" (1.632 m)   Wt 130 lb (59 kg)   LMP 09/19/2008   BMI 22.14 kg/m  Height: 5' 4.25" (163.2 cm) Ht Readings from Last 3 Encounters:  06/22/16 5' 4.25" (1.632 m)  10/25/15 '5\' 4"'  (1.626 m)  10/14/15 '5\' 4"'  (1.626 m)    General appearance: alert, cooperative and appears stated age Head: Normocephalic, without obvious abnormality, atraumatic Neck: no adenopathy, supple, symmetrical, trachea midline and thyroid normal to inspection and palpation Lungs: clear to auscultation bilaterally Breasts: normal appearance, no masses or tenderness Heart: regular rate and rhythm Abdomen: soft, non-tender; no masses,  no organomegaly,  No pain initiated on exam Extremities: extremities normal, atraumatic, no cyanosis or edema Skin: Skin color, texture, turgor normal. No rashes or lesions Lymph nodes: Cervical, supraclavicular, and axillary nodes normal. No abnormal inguinal nodes palpated Neurologic: Grossly normal   Pelvic: External  genitalia:  no lesions              Urethra:  normal appearing urethra with no masses, tenderness or lesions              Bartholin's and Skene's: normal                 Vagina: normal appearing  vagina with normal color and discharge, no lesions              Cervix: anteverted              Pap taken: No. 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 Etiology  History of constipation with Tamoxifen  R/O vaginitis   P:   Reviewed health and wellness pertinent to exam  Pap smear as above  Mammogram is due 10/2016 and MRI is due 10/18  Will follow with wet prep and PUS   She is anxious about pelvic pain and feels more at ease if PUS is done.  Counseled on breast self exam, mammography screening, adequate intake of calcium and vitamin D, diet and exercise, Kegel's exercises return annually or prn  An After Visit Summary was printed and given to the patient.

## 2016-06-23 DIAGNOSIS — E1040 Type 1 diabetes mellitus with diabetic neuropathy, unspecified: Secondary | ICD-10-CM | POA: Diagnosis not present

## 2016-06-23 LAB — WET PREP BY MOLECULAR PROBE
Candida species: NEGATIVE
Gardnerella vaginalis: NEGATIVE
Trichomonas vaginosis: NEGATIVE

## 2016-06-25 NOTE — Progress Notes (Signed)
Encounter reviewed by Dr. Brook Amundson C. Silva.  

## 2016-06-28 ENCOUNTER — Telehealth: Payer: Self-pay | Admitting: Nurse Practitioner

## 2016-06-28 DIAGNOSIS — N08 Glomerular disorders in diseases classified elsewhere: Secondary | ICD-10-CM | POA: Diagnosis not present

## 2016-06-28 DIAGNOSIS — M069 Rheumatoid arthritis, unspecified: Secondary | ICD-10-CM | POA: Diagnosis not present

## 2016-06-28 DIAGNOSIS — E784 Other hyperlipidemia: Secondary | ICD-10-CM | POA: Diagnosis not present

## 2016-06-28 DIAGNOSIS — D721 Eosinophilia: Secondary | ICD-10-CM | POA: Diagnosis not present

## 2016-06-28 DIAGNOSIS — E1040 Type 1 diabetes mellitus with diabetic neuropathy, unspecified: Secondary | ICD-10-CM | POA: Diagnosis not present

## 2016-06-28 DIAGNOSIS — Z6822 Body mass index (BMI) 22.0-22.9, adult: Secondary | ICD-10-CM | POA: Diagnosis not present

## 2016-06-28 DIAGNOSIS — I10 Essential (primary) hypertension: Secondary | ICD-10-CM | POA: Diagnosis not present

## 2016-06-28 DIAGNOSIS — D649 Anemia, unspecified: Secondary | ICD-10-CM | POA: Diagnosis not present

## 2016-06-28 DIAGNOSIS — E113599 Type 2 diabetes mellitus with proliferative diabetic retinopathy without macular edema, unspecified eye: Secondary | ICD-10-CM | POA: Diagnosis not present

## 2016-06-28 DIAGNOSIS — M859 Disorder of bone density and structure, unspecified: Secondary | ICD-10-CM | POA: Diagnosis not present

## 2016-06-28 NOTE — Telephone Encounter (Signed)
Patient returning call.

## 2016-06-28 NOTE — Telephone Encounter (Signed)
Called patient to review benefits for a recommended procedure. Left Voicemail requesting a call back. °

## 2016-06-29 DIAGNOSIS — R51 Headache: Secondary | ICD-10-CM | POA: Diagnosis not present

## 2016-06-29 DIAGNOSIS — G518 Other disorders of facial nerve: Secondary | ICD-10-CM | POA: Diagnosis not present

## 2016-06-29 DIAGNOSIS — M542 Cervicalgia: Secondary | ICD-10-CM | POA: Diagnosis not present

## 2016-06-29 DIAGNOSIS — G43719 Chronic migraine without aura, intractable, without status migrainosus: Secondary | ICD-10-CM | POA: Diagnosis not present

## 2016-06-29 DIAGNOSIS — E1040 Type 1 diabetes mellitus with diabetic neuropathy, unspecified: Secondary | ICD-10-CM | POA: Diagnosis not present

## 2016-06-29 DIAGNOSIS — M791 Myalgia: Secondary | ICD-10-CM | POA: Diagnosis not present

## 2016-06-29 NOTE — Telephone Encounter (Signed)
Spoke with patient regarding benefit for ultrasound. Patient understood and agreeable. Patient ready to schedule. Patient scheduled 07/08/16 with Dr Edward Jolly. Patient aware of arrival date, time and cancellation policy. No further questions. Ok to close

## 2016-06-30 ENCOUNTER — Telehealth: Payer: Self-pay | Admitting: Obstetrics and Gynecology

## 2016-06-30 NOTE — Telephone Encounter (Signed)
Spoke with patient. Patient is going to be going out of town and needs to reschedule PUS for concerns regarding ovarian cancer. Patient has had intermittent abdominal pain and has a [facility] of ovarian cancer. Denies pain at this time and states she feels comfortable waiting for PUS. Advised if pain returns or worsens needs to be seen for immediate medical evaluation. PUS rescheduled for 07/22/2016 at 10 am with 10:30 am consult with Dr.Silva. Patient is agreeable to date and time.  Routing to provider for final review. Patient agreeable to disposition. Will close encounter.

## 2016-06-30 NOTE — Telephone Encounter (Signed)
Patient wants to reschedule her ultrasound appointment.

## 2016-07-01 ENCOUNTER — Ambulatory Visit: Payer: Medicare Other | Admitting: Oncology

## 2016-07-08 ENCOUNTER — Other Ambulatory Visit: Payer: Medicare Other

## 2016-07-08 ENCOUNTER — Other Ambulatory Visit: Payer: Medicare Other | Admitting: Obstetrics and Gynecology

## 2016-07-22 ENCOUNTER — Encounter: Payer: Self-pay | Admitting: Obstetrics and Gynecology

## 2016-07-22 ENCOUNTER — Ambulatory Visit (INDEPENDENT_AMBULATORY_CARE_PROVIDER_SITE_OTHER): Payer: Medicare Other | Admitting: Obstetrics and Gynecology

## 2016-07-22 ENCOUNTER — Other Ambulatory Visit: Payer: Self-pay

## 2016-07-22 ENCOUNTER — Ambulatory Visit (INDEPENDENT_AMBULATORY_CARE_PROVIDER_SITE_OTHER): Payer: Medicare Other

## 2016-07-22 VITALS — BP 104/58 | HR 84 | Ht 64.25 in | Wt 131.0 lb

## 2016-07-22 DIAGNOSIS — R1030 Lower abdominal pain, unspecified: Secondary | ICD-10-CM

## 2016-07-22 DIAGNOSIS — K5900 Constipation, unspecified: Secondary | ICD-10-CM

## 2016-07-22 DIAGNOSIS — M25559 Pain in unspecified hip: Secondary | ICD-10-CM | POA: Diagnosis not present

## 2016-07-22 NOTE — Progress Notes (Signed)
Patient ID: Becky [name], female   DOB: [birth year], 61 y.o.   MRN: [id number] GYNECOLOGY  VISIT   HPI: 61 y.o.   Married  Caucasian  female   G1P1001 with Patient's last menstrual period was 09/19/2008.   here for pelvic ultrasound for pelvic pain.   Has bilateral lower abdominal pain.  Worried about ovarian cancer.  No postmenopausal bleeding.  Negative wet prep 06/22/16.  On Cymbalta.  Had negative genetic testing in Nov. 2016.  She will do yearly MRI due to increased risk of breast cancer due to family history.  Unable to tolerate Tamoxifen due to severe constipation.   Has diabetes for 49 years.   Has constipation all of the time. Uses prescription Miralax daily from Dr. Evlyn Kanner.  GYNECOLOGIC HISTORY: Patient's last menstrual period was 09/19/2008. Contraception:  Postmenopausal Menopausal hormone therapy:  none Last mammogram:  11/05/15 3D/ Bi-Rads 1: Negative MRI negative 10/17 Last pap smear: 03/18/15,Negative: negative HR HPV          OB History    Gravida Para Term Preterm AB Living   1 1 1  0 0 1   SAB TAB Ectopic Multiple Live Births   0 0 0 0 1         Patient Active Problem List   Diagnosis Date Noted  . 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 Increased risk of breast cancer 07/06/2015  . IDDM (insulin dependent diabetes mellitus) (HCC) 07/06/2015  . 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 Sjogren's syndrome (HCC) 07/06/2015  . Genetic testing 05/12/2015  . Family history of breast cancer 04/15/2015  . Fibrocystic breast disease 10/11/2012  . GERD 10/01/2009  . Chest pain with moderate risk for cardiac etiology 09/02/2009  . DYSPHAGIA 09/02/2009  . DIABETES MELLITUS-TYPE II 08/15/2009  . ANXIETY 08/15/2009  . ULCER-GASTRIC 08/15/2009  . CONSTIPATION 08/15/2009  . SLOW TRANSIT CONSTIPATION 08/15/2009  . FLATULENCE ERUCTATION AND GAS PAIN 08/15/2009    Past Medical History:  Diagnosis Date  . Abnormal ECG   . Anemia   . Anxiety   . Cataract    .  Cateracts bil eyes removed  . GERD (gastroesophageal reflux disease)   . Heart murmur   . Hypercholesterolemia   . IDDM (insulin dependent diabetes mellitus) (HCC) 1969  . Migraine headache   . Neuromuscular disorder (HCC)    bil legs & feet - neuropathy  . Neuropathy (HCC)   . Osteoporosis    10/2012  . Otosclerosis   . RA (rheumatoid arthritis) (HCC) 2000  . S/P cardiac cath August 2013   Normal coronaries and normal LV function.  . Sjoegren syndrome [facility])     Past Surgical History:  Procedure Laterality Date  . BREAST LUMPECTOMY  1995  . CARPAL TUNNEL RELEASE  2000  . CATARACT EXTRACTION Right 09-05-12  . CATARACT EXTRACTION Left 3/15  . CESAREAN SECTION  1987  . COLONOSCOPY    . EYE SURGERY     diabetic retinopathy   . HYSTEROSCOPY  02/2001   and D & C  . LYMPH NODE DISSECTION  2002  . TOE SURGERY  2000   bone removed    Current Outpatient Prescriptions  Medication Sig Dispense Refill  . aspirin 81 MG tablet Take 81 mg by mouth daily.    . calcium carbonate (OS-CAL) 600 MG TABS Take 600 mg by mouth daily.    . cyclobenzaprine (FLEXERIL) 10 MG tablet Take 10 mg by mouth 3 (three) times daily as needed for muscle spasms. Reported on 07/03/2015    .  cycloSPORINE (RESTASIS) 0.05 % ophthalmic emulsion Place 1 drop into both eyes 2 (two) times daily.    . DULoxetine (CYMBALTA) 60 MG capsule Take 60 mg by mouth daily.    Marland Kitchen etanercept (ENBREL SURECLICK) 50 MG/ML injection Inject 0.98 mLs (50 mg total) into the skin once a week. 11.74 mL 0  . furosemide (LASIX) 20 MG tablet Take 20 mg by mouth every other day.    . insulin aspart (NOVOLOG) 100 UNIT/ML injection Insulin pump    . losartan (COZAAR) 25 MG tablet Take 25 mg by mouth daily.    . methotrexate 50 MG/2ML injection Inject 0.8 mLs (20 mg total) into the skin once a week. 10 mL 0  . Multiple Vitamin (MULTIVITAMIN) tablet Take 1 tablet by mouth daily.    Marland Kitchen POLYETHYLENE GLYCOL 3350 PO Take by mouth daily.    . pravastatin  (PRAVACHOL) 20 MG tablet Take 20 mg by mouth daily.    . SUMAtriptan (IMITREX) 100 MG tablet Take 100 mg by mouth every 2 (two) hours as needed.     No current facility-administered medications for this visit.      ALLERGIES: Ciprofloxacin and Penicillins  Family History  Problem Relation Age of Onset  . Breast cancer Mother 57  . Colon polyps Mother     unspecified number  . Breast cancer Maternal Grandmother 45  . Stroke Father 31  . Rheum arthritis Sister   . Breast cancer Sister 48    lump; (-)GT (breast/ova ca panel, GeneDx)  . Skin cancer Maternal Grandfather     unspecified type  . Prostate cancer Maternal Grandfather     dx. late 44s  . Breast cancer Other     dx. in their late 32s  . Breast cancer Other     dx. 78-79  . Breast cancer Cousin     bilateral; dx. late 23s  . Colon cancer Neg Hx   . Esophageal cancer Neg Hx   . Rectal cancer Neg Hx   . Stomach cancer Neg Hx     Social History   Social History  . Marital status: Married    Spouse name: N/A  . Number of children: 1  . Years of education: N/A   Occupational History  . disability Unemployed   Social History Main Topics  . Smoking status: Never Smoker  . Smokeless tobacco: Never Used  . Alcohol use 2.4 oz/week    2 Shots of liquor, 2 Standard drinks or equivalent per week     Comment: maybe 1-2 cocktails per wk  . Drug use: No  . Sexual activity: Yes    Partners: Male     Comment: vasectomy   Other Topics Concern  . Not on file   Social History Narrative  . No narrative on file    ROS:  Pertinent items are noted in HPI.  PHYSICAL EXAMINATION:    BP (!) 104/58 (BP Location: Right Arm, Patient Position: Sitting, Cuff Size: Normal)   Pulse 84   Ht 5' 4.25" (1.632 m)   Wt 131 lb (59.4 kg)   LMP 09/19/2008   BMI 22.31 kg/m     General appearance: alert, cooperative and appears stated age   Pelvic ultrasound Uterus no masses.  EMS 2.43 mm. Small amount of free fluid in  endometrial cavity.  Normal ovaries.  No free fluid.  ASSESSMENT  FH ovarian cancer.  Unremarkable pelvic ultrasound today.   PLAN  Reassurance given regarding ultrasound today.  Images and  report reviewed.  No need for EMB due to EMS measurement under 3 mm. Call for any postmenopausal bleeding.  Continue regimen for constipation.  Follow up for annual exam and prn.   An After Visit Summary was printed and given to the patient.  __15____ minutes face to face time of which over 50% was spent in counseling.

## 2016-07-22 NOTE — Progress Notes (Signed)
Encounter reviewed by Dr. Brook Amundson C. Silva.  

## 2016-08-10 DIAGNOSIS — G43719 Chronic migraine without aura, intractable, without status migrainosus: Secondary | ICD-10-CM | POA: Diagnosis not present

## 2016-08-10 DIAGNOSIS — M542 Cervicalgia: Secondary | ICD-10-CM | POA: Diagnosis not present

## 2016-08-10 DIAGNOSIS — G518 Other disorders of facial nerve: Secondary | ICD-10-CM | POA: Diagnosis not present

## 2016-08-10 DIAGNOSIS — M791 Myalgia: Secondary | ICD-10-CM | POA: Diagnosis not present

## 2016-08-12 DIAGNOSIS — E103591 Type 1 diabetes mellitus with proliferative diabetic retinopathy without macular edema, right eye: Secondary | ICD-10-CM | POA: Diagnosis not present

## 2016-08-12 DIAGNOSIS — E103592 Type 1 diabetes mellitus with proliferative diabetic retinopathy without macular edema, left eye: Secondary | ICD-10-CM | POA: Diagnosis not present

## 2016-08-12 DIAGNOSIS — E1039 Type 1 diabetes mellitus with other diabetic ophthalmic complication: Secondary | ICD-10-CM | POA: Diagnosis not present

## 2016-08-12 DIAGNOSIS — H4321 Crystalline deposits in vitreous body, right eye: Secondary | ICD-10-CM | POA: Diagnosis not present

## 2016-08-17 ENCOUNTER — Other Ambulatory Visit: Payer: Self-pay | Admitting: Rheumatology

## 2016-08-17 DIAGNOSIS — H906 Mixed conductive and sensorineural hearing loss, bilateral: Secondary | ICD-10-CM | POA: Diagnosis not present

## 2016-08-17 DIAGNOSIS — H90A31 Mixed conductive and sensorineural hearing loss, unilateral, right ear with restricted hearing on the contralateral side: Secondary | ICD-10-CM | POA: Diagnosis not present

## 2016-08-17 DIAGNOSIS — H90A22 Sensorineural hearing loss, unilateral, left ear, with restricted hearing on the contralateral side: Secondary | ICD-10-CM | POA: Diagnosis not present

## 2016-08-17 DIAGNOSIS — Z79899 Other long term (current) drug therapy: Secondary | ICD-10-CM | POA: Diagnosis not present

## 2016-08-17 DIAGNOSIS — H8093 Unspecified otosclerosis, bilateral: Secondary | ICD-10-CM | POA: Diagnosis not present

## 2016-08-17 DIAGNOSIS — H9313 Tinnitus, bilateral: Secondary | ICD-10-CM | POA: Diagnosis not present

## 2016-08-17 DIAGNOSIS — H9193 Unspecified hearing loss, bilateral: Secondary | ICD-10-CM | POA: Diagnosis not present

## 2016-08-17 LAB — COMPLETE METABOLIC PANEL WITH GFR
ALT: 16 U/L (ref 6–29)
AST: 20 U/L (ref 10–35)
Albumin: 4 g/dL (ref 3.6–5.1)
Alkaline Phosphatase: 65 U/L (ref 33–130)
BUN: 21 mg/dL (ref 7–25)
CO2: 28 mmol/L (ref 20–31)
Calcium: 9.7 mg/dL (ref 8.6–10.4)
Chloride: 106 mmol/L (ref 98–110)
Creat: 1.06 mg/dL — ABNORMAL HIGH (ref 0.50–1.05)
GFR, Est African American: 66 mL/min (ref >=60)
GFR, Est Non African American: 58 mL/min — ABNORMAL LOW (ref >=60)
Glucose, Bld: 66 mg/dL (ref 65–99)
Potassium: 4.2 mmol/L (ref 3.5–5.3)
Sodium: 142 mmol/L (ref 135–146)
Total Bilirubin: 0.4 mg/dL (ref 0.2–1.2)
Total Protein: 7 g/dL (ref 6.1–8.1)

## 2016-08-17 LAB — CBC WITH DIFFERENTIAL/PLATELET
Basophils Absolute: 53 cells/uL (ref 0–200)
Basophils Relative: 1 %
Eosinophils Absolute: 689 cells/uL — ABNORMAL HIGH (ref 15–500)
Eosinophils Relative: 13 %
HCT: 41.4 % (ref 35.0–45.0)
Hemoglobin: 13.6 g/dL (ref 11.7–15.5)
Lymphocytes Relative: 33 %
Lymphs Abs: 1749 cells/uL (ref 850–3900)
MCH: 29.8 pg (ref 27.0–33.0)
MCHC: 32.9 g/dL (ref 32.0–36.0)
MCV: 90.8 fL (ref 80.0–100.0)
MPV: 12.5 fL (ref 7.5–12.5)
Monocytes Absolute: 477 cells/uL (ref 200–950)
Monocytes Relative: 9 %
Neutro Abs: 2332 cells/uL (ref 1500–7800)
Neutrophils Relative %: 44 %
Platelets: 276 10*3/uL (ref 140–400)
RBC: 4.56 MIL/uL (ref 3.80–5.10)
RDW: 14.1 % (ref 11.0–15.0)
WBC: 5.3 10*3/uL (ref 3.8–10.8)

## 2016-08-17 NOTE — Progress Notes (Signed)
Mild increase in creatinine. We'll monitor for right now. Please for results to PCP.

## 2016-08-24 ENCOUNTER — Ambulatory Visit (INDEPENDENT_AMBULATORY_CARE_PROVIDER_SITE_OTHER): Payer: Medicare Other | Admitting: Rheumatology

## 2016-08-24 ENCOUNTER — Telehealth: Payer: Self-pay | Admitting: Rheumatology

## 2016-08-24 ENCOUNTER — Encounter: Payer: Self-pay | Admitting: Rheumatology

## 2016-08-24 VITALS — BP 115/70 | HR 92 | Resp 16 | Ht 64.0 in | Wt 125.0 lb

## 2016-08-24 DIAGNOSIS — M65332 Trigger finger, left middle finger: Secondary | ICD-10-CM | POA: Diagnosis not present

## 2016-08-24 DIAGNOSIS — Z8739 Personal history of other diseases of the musculoskeletal system and connective tissue: Secondary | ICD-10-CM

## 2016-08-24 DIAGNOSIS — M25561 Pain in right knee: Secondary | ICD-10-CM | POA: Diagnosis not present

## 2016-08-24 DIAGNOSIS — M069 Rheumatoid arthritis, unspecified: Secondary | ICD-10-CM

## 2016-08-24 DIAGNOSIS — Z79899 Other long term (current) drug therapy: Secondary | ICD-10-CM

## 2016-08-24 DIAGNOSIS — M65341 Trigger finger, right ring finger: Secondary | ICD-10-CM | POA: Insufficient documentation

## 2016-08-24 DIAGNOSIS — Z8659 Personal history of other mental and behavioral disorders: Secondary | ICD-10-CM | POA: Insufficient documentation

## 2016-08-24 DIAGNOSIS — M5136 Other intervertebral disc degeneration, lumbar region: Secondary | ICD-10-CM | POA: Insufficient documentation

## 2016-08-24 DIAGNOSIS — M170 Bilateral primary osteoarthritis of knee: Secondary | ICD-10-CM | POA: Insufficient documentation

## 2016-08-24 DIAGNOSIS — M25562 Pain in left knee: Secondary | ICD-10-CM

## 2016-08-24 DIAGNOSIS — M3509 Sicca syndrome with other organ involvement: Secondary | ICD-10-CM | POA: Diagnosis not present

## 2016-08-24 DIAGNOSIS — Z8669 Personal history of other diseases of the nervous system and sense organs: Secondary | ICD-10-CM | POA: Insufficient documentation

## 2016-08-24 MED ORDER — "TUBERCULIN-ALLERGY SYRINGES 27G X 1/2"" 1 ML KIT": 1.0000 | PACK | 3 refills | Status: DC

## 2016-08-24 MED ORDER — FOLIC ACID 1 MG PO TABS: 2.0000 mg | ORAL_TABLET | Freq: Every day | ORAL | 3 refills | Status: DC

## 2016-08-24 MED ORDER — METHOTREXATE SODIUM CHEMO INJECTION 50 MG/2ML: 20.0000 mg | INTRAMUSCULAR | 0 refills | Status: DC

## 2016-08-24 NOTE — Progress Notes (Signed)
Office Visit Note  [name]             Date of Birth: 09-Feb-1956           MRN: [id number]             PCP: Sheela Stack, MD Referring: Marton Redwood, MD Visit Date: 08/24/2016 Occupation: _0 @    Subjective:  Follow-up Follow-up rheumatoid arthritis and iritis and arthritis  History of Present Illness: Becky [name] is a 61 y.o. female  Last seen 02/12/2016   Patient has a history of rheumatoid arthritis with iritis and arthritis. Patient was on Enbrel and methotrexate but has stopped those medications as of December 2017. She has not taken any of these medications in all of December, January 2018, February 2018 and today. She has replaced those medications with medication such as curcumin, tart Cherry, hemp oil, flaxseed oil. She is doing really well with her joints at this time.  Note: Patient also states that she was not getting enough relief when she was on the Enbrel and methotrexate. She had effects that were problematic for her. Examples her hair loss, site reactions whenever she gave herself the Enbrel, oral ulcers. In addition she had ongoing fatigue.  Currently, having trigger finger to left 3rd and right 4th finger.    Activities of Daily Living:  Patient reports morning stiffness for 15 minutes.   Patient Denies nocturnal pain.  Difficulty dressing/grooming: Denies Difficulty climbing stairs: Denies Difficulty getting out of chair: Denies Difficulty using hands for taps, buttons, cutlery, and/or writing: Denies   Review of Systems  Constitutional: Negative for fatigue.  HENT: Negative for mouth sores and mouth dryness.   Eyes: Negative for dryness.  Respiratory: Negative for shortness of breath.   Gastrointestinal: Negative for constipation and diarrhea.  Musculoskeletal: Negative for myalgias and myalgias.  Skin: Negative for sensitivity to sunlight.  Psychiatric/Behavioral: Negative for decreased concentration and  sleep disturbance.    PMFS History:  Patient Active Problem List   Diagnosis Date Noted  . Rheumatoid arthritis involving multiple sites ([place]) 08/24/2016  . History of joint swelling 08/24/2016  . Arthralgia of both knees 08/24/2016  . History of migraine 08/24/2016  . History of depression 08/24/2016  . History of osteoporosis 08/24/2016  . DDD (degenerative disc disease), lumbar 08/24/2016  . High risk medication use 08/24/2016  . Trigger finger, left middle finger 08/24/2016  . Trigger finger, right ring finger 08/24/2016  . 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 Increased risk of breast cancer 07/06/2015  . IDDM (insulin dependent diabetes mellitus) ([place]) 07/06/2015  . 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 Sjogren's syndrome ([place]) 07/06/2015  . Genetic testing 05/12/2015  . Family history of breast cancer 04/15/2015  . Fibrocystic breast disease 10/11/2012  . GERD 10/01/2009  . Chest pain with moderate risk for cardiac etiology 09/02/2009  . DYSPHAGIA 09/02/2009  . DIABETES MELLITUS-TYPE II 08/15/2009  . ANXIETY 08/15/2009  . ULCER-GASTRIC 08/15/2009  . CONSTIPATION 08/15/2009  . SLOW TRANSIT CONSTIPATION 08/15/2009  . FLATULENCE ERUCTATION AND GAS PAIN 08/15/2009    Past Medical History:  Diagnosis Date  . Abnormal ECG   . Anemia   . Anxiety   . Cataract    . Cateracts bil eyes removed  . GERD (gastroesophageal reflux disease)   . Heart murmur   . Hypercholesterolemia   . IDDM (insulin dependent diabetes mellitus) ([place]) 1969  . Migraine headache   . Neuromuscular disorder (HCC)    bil legs & feet -  neuropathy  . Neuropathy ([place])   . Osteoporosis    10/2012  . Otosclerosis   . RA (rheumatoid arthritis) ([place]) 2000  . S/P cardiac cath August 2013   Normal coronaries and normal LV function.  . Sjoegren syndrome ([place])     Family History  Problem Relation Age of Onset  . Breast cancer Mother 35  . Colon polyps Mother     unspecified number  . Breast cancer  Maternal Grandmother 79  . Stroke Father 33  . Rheum arthritis Sister   . Breast cancer Sister 48    lump; (-)GT (breast/ova ca panel, Gene[facility])  . Skin cancer Maternal Grandfather     unspecified type  . Prostate cancer Maternal Grandfather     dx. late 39s  . Breast cancer Other     dx. in their late 85s  . Breast cancer Other     dx. 78-79  . Breast cancer Cousin     bilateral; dx. late 6s  . Colon cancer Neg Hx   . Esophageal cancer Neg Hx   . Rectal cancer Neg Hx   . Stomach cancer Neg Hx    Past Surgical History:  Procedure Laterality Date  . BREAST LUMPECTOMY  1995  . CARPAL TUNNEL RELEASE  2000  . CATARACT EXTRACTION Right 09-05-12  . CATARACT EXTRACTION Left 3/15  . CESAREAN SECTION  1987  . COLONOSCOPY    . EYE SURGERY     diabetic retinopathy   . HYSTEROSCOPY  02/2001   and D & C  . LYMPH NODE DISSECTION  2002  . TOE SURGERY  2000   bone removed   Social History   Social History Narrative  . No narrative on file     Objective: Vital Signs: BP 115/70   Pulse 92   Resp 16   Ht _0  (1.626 m)   Wt 125 lb (56.7 kg)   LMP 09/19/2008   BMI 21.46 kg/m    Physical Exam  Constitutional: She is oriented to person, place, and time. She appears well-developed and well-nourished.  HENT:  Head: Normocephalic and atraumatic.  Eyes: EOM are normal. Pupils are equal, round, and reactive to light.  Cardiovascular: Normal rate, regular rhythm and normal heart sounds.  Exam reveals no gallop and no friction rub.   No murmur heard. Pulmonary/Chest: Effort normal and breath sounds normal. She has no wheezes. She has no rales.  Abdominal: Soft. Bowel sounds are normal. She exhibits no distension. There is no tenderness. There is no guarding. No hernia.  Musculoskeletal: Normal range of motion. She exhibits no edema, tenderness or deformity.  Lymphadenopathy:    She has no cervical adenopathy.  Neurological: She is alert and oriented to person, place, and time.  Coordination normal.  Skin: Skin is warm and dry. Capillary refill takes less than 2 seconds. No rash noted.  Psychiatric: She has a normal mood and affect. Her behavior is normal.  Nursing note and vitals reviewed.    Musculoskeletal Exam:  Full range of motion of all joints Grip strength is equal and strong bilaterally Fibromyalgia tender points are all absent  CDAI Exam: CDAI Homunculus Exam:   Joint Counts:  CDAI Tender Joint count: 0 CDAI Swollen Joint count: 0  Global Assessments:  Patient Global Assessment: 2 Provider Global Assessment: 2  CDAI Calculated Score: 4    Investigation: Findings:  June 2017:  CBC was normal.  Comprehensive metabolic panel showed glucose of 154.   11/2015-TB Gold Negative  Orders Only on 08/17/2016  Component Date Value Ref Range Status  . Sodium 08/17/2016 142  135 - 146 mmol/L Final  . Potassium 08/17/2016 4.2  3.5 - 5.3 mmol/L Final  . Chloride 08/17/2016 106  98 - 110 mmol/L Final  . CO2 08/17/2016 28  20 - 31 mmol/L Final  . Glucose, Bld 08/17/2016 66  65 - 99 mg/dL Final  . BUN 08/17/2016 21  7 - 25 mg/dL Final  . Creat 08/17/2016 1.06* 0.50 - 1.05 mg/dL Final   Comment:   For patients > or = 60 years of age: The upper reference limit for Creatinine is approximately 13% higher for people identified as African-American.     . Total Bilirubin 08/17/2016 0.4  0.2 - 1.2 mg/dL Final  . Alkaline Phosphatase 08/17/2016 65  33 - 130 U/L Final  . AST 08/17/2016 20  10 - 35 U/L Final  . ALT 08/17/2016 16  6 - 29 U/L Final  . Total Protein 08/17/2016 7.0  6.1 - 8.1 g/dL Final  . Albumin 08/17/2016 4.0  3.6 - 5.1 g/dL Final  . Calcium 08/17/2016 9.7  8.6 - 10.4 mg/dL Final  . GFR, Est African American 08/17/2016 66  >=60 mL/min Final  . GFR, Est Non African American 08/17/2016 58* >=60 mL/min Final  . WBC 08/17/2016 5.3  3.8 - 10.8 K/uL Final  . RBC 08/17/2016 4.56  3.80 - 5.10 MIL/uL Final  . Hemoglobin 08/17/2016 13.6  11.7 -  15.5 g/dL Final  . HCT 08/17/2016 41.4  35.0 - 45.0 % Final  . MCV 08/17/2016 90.8  80.0 - 100.0 fL Final  . MCH 08/17/2016 29.8  27.0 - 33.0 pg Final  . MCHC 08/17/2016 32.9  32.0 - 36.0 g/dL Final  . RDW 08/17/2016 14.1  11.0 - 15.0 % Final  . Platelets 08/17/2016 276  140 - 400 K/uL Final  . MPV 08/17/2016 12.5  7.5 - 12.5 fL Final  . Neutro Abs 08/17/2016 2332  1,500 - 7,800 cells/uL Final  . Lymphs Abs 08/17/2016 1749  850 - 3,900 cells/uL Final  . Monocytes Absolute 08/17/2016 477  200 - 950 cells/uL Final  . Eosinophils Absolute 08/17/2016 689* 15 - 500 cells/uL Final  . Basophils Absolute 08/17/2016 53  0 - 200 cells/uL Final  . Neutrophils Relative % 08/17/2016 44  % Final  . Lymphocytes Relative 08/17/2016 33  % Final  . Monocytes Relative 08/17/2016 9  % Final  . Eosinophils Relative 08/17/2016 13  % Final  . Basophils Relative 08/17/2016 1  % Final  . Smear Review 08/17/2016 Criteria for review not met   Final  Office Visit on 06/22/2016  Component Date Value Ref Range Status  . Candida species 06/22/2016 NEG  Negative Final  . Trichomonas vaginosis 06/22/2016 NEG  Negative Final  . Gardnerella vaginalis 06/22/2016 NEG  Negative Final      Imaging: No results found.  Speciality Comments: No specialty comments available.    Procedures:  No procedures performed Allergies: Ciprofloxacin and Penicillins   Assessment / Plan:     Visit Diagnoses: Rheumatoid arthritis involving multiple sites, unspecified rheumatoid factor presence (HCC) - History of iritis and arthritis  High risk medication use - 08/24/2016 ==> Stopped Enbrel once a week in late November //08/24/2016 ==> Stop methotrexate once a week //  Trigger finger, left middle finger  Trigger finger, right ring finger  Sjogren's syndrome with other organ involvement (HCC)  Arthralgia of both knees  History of osteoporosis -  S/P Forteo Injections  DDD (degenerative disc disease), lumbar   Plan: #1:  Rheumatoid arthritis with history of iritis and arthritis. No flare at this time. Patient was having site reactions with Enbrel, hair loss with methotrexate, ongoing mild joint pain despite being on this treatment and decided to come off the medication altogether. She has been off of the medication all of December January and February. She is doing really well without the medication. She has instead decided to do curcumin, Hemp seed oil, tart Cherry, black seed oil. She states that she's been getting significant relief with this medication. I've asked the patient to continue these medications in the meanwhile and restart methotrexate. Patient is agreeable. Dr. Estanislado Pandy had a full and thorough discussion and she wants to restart methotrexate at 0.8 ML's every week  #2: High risk prescription. OFF of Enbrel since Late Nov 2017 OFF of methotrexate 0.8 ML's Late Nov 1497 OFF of folic acid 2 mg daily since  Late Nov 2017  After discussing the importance of treating the autoimmune disease with some type of immunosuppressive, patient is agreeable to restart injectable methotrexate. She was on methotrexate 0.8 ML's in the past and wishes to start there. We will offer her a lower dose in the future if she has any issues with the hair loss that was of concern to her.  #3: History of sicca symptoms. Has been using new eyedrops and getting good results  #4: Patient's labs are up-to-date and normal. She will need labs every 3 months starting May 2018  #5: Return to clinic in 3 months  #6: History of osteoarthritis. Has had Euflex at in the past. Her third Euflexxa injection was completed February 2016 I have sent a message to share in to apply for Euflex of bilateral knees 3  Orders: No orders of the defined types were placed in this encounter.  Meds ordered this encounter  Medications  . methotrexate 50 MG/2ML injection    Sig: Inject 0.8 mLs (20 mg total) into the skin once a week.     Dispense:  10 mL    Refill:  0    Please dispense 108m vials with preservatives.  . Tuberculin-Allergy Syringes 27G X 1/2" 1 ML KIT    Sig: Inject 1 Syringe into the skin once a week. To be used with weekly methotrexate injections.    Dispense:  12 each    Refill:  3  . folic acid (FOLVITE) 1 MG tablet    Sig: Take 2 tablets (2 mg total) by mouth daily.    Dispense:  180 tablet    Refill:  3    Face-to-face time spent with patient was 30 minutes. 50% of time was spent in counseling and coordination of care.  Follow-Up Instructions: Return in about 3 months (around 11/24/2016) for RA,iritis,knee pain, non compliance medication (off of enbrel, mtx x 3 months).   NEliezer Lofts PA-C On my exam today she has some synovial thickening and some tenderness over her MCP joints.. Detailed discussion regarding different treatment options at this time she is willing to resume methotrexate subcutaneously. We will hold off Biologics unless needed. I examined and evaluated the patient with NEliezer LoftsPA. The plan of care was discussed as noted above.  SBo Merino MD Note - This record has been created using DEditor, commissioning  Chart creation errors have been sought, but may not always  have been located. Such creation errors do not reflect on  the standard of medical care.

## 2016-08-24 NOTE — Telephone Encounter (Signed)
Patient has a question for Mr. Leane Call or Dr. Orson Aloe about something listed on her AVS from today. She states it is listed on the AVS that she is on a "high risk medication" and she has questions about this. Please call patient.

## 2016-08-24 NOTE — Progress Notes (Signed)
Pharmacy Note  Subjective: Patient presents today to the [facility] to see Dr. Gabrielle Dare. [place].  Patient was restarted on methotrexate 0.8 mL weekly and folic acid 2 mg daily.  Patient consented to methotrexate on 07/22/2015.  Patient seen by the pharmacist for counseling on methotrexate.    Objective: CBC    Component Value Date/Time   WBC 5.3 08/17/2016 1141   RBC 4.56 08/17/2016 1141   HGB 13.6 08/17/2016 1141   HGB 12.8 01/06/2016 1005   HCT 41.4 08/17/2016 1141   HCT 39.3 01/06/2016 1005   PLT 276 08/17/2016 1141   PLT 279 01/06/2016 1005   MCV 90.8 08/17/2016 1141   MCV 92.9 01/06/2016 1005   MCH 29.8 08/17/2016 1141   MCHC 32.9 08/17/2016 1141   RDW 14.1 08/17/2016 1141   RDW 15.0 (H) 01/06/2016 1005   LYMPHSABS 1,749 08/17/2016 1141   LYMPHSABS 1.4 01/06/2016 1005   MONOABS 477 08/17/2016 1141   MONOABS 0.3 01/06/2016 1005   EOSABS 689 (H) 08/17/2016 1141   EOSABS 0.6 (H) 01/06/2016 1005   BASOSABS 53 08/17/2016 1141   BASOSABS 0.1 01/06/2016 1005   CMP     Component Value Date/Time   NA 142 08/17/2016 1141   NA 141 01/06/2016 1005   K 4.2 08/17/2016 1141   K 4.1 01/06/2016 1005   CL 106 08/17/2016 1141   CO2 28 08/17/2016 1141   CO2 25 01/06/2016 1005   GLUCOSE 66 08/17/2016 1141   GLUCOSE 175 (H) 01/06/2016 1005   BUN 21 08/17/2016 1141   BUN 14.8 01/06/2016 1005   CREATININE 1.06 (H) 08/17/2016 1141   CREATININE 1.0 01/06/2016 1005   CALCIUM 9.7 08/17/2016 1141   CALCIUM 9.7 01/06/2016 1005   PROT 7.0 08/17/2016 1141   PROT 6.9 01/06/2016 1005   ALBUMIN 4.0 08/17/2016 1141   ALBUMIN 3.7 01/06/2016 1005   AST 20 08/17/2016 1141   AST 60 (H) 01/06/2016 1005   ALT 16 08/17/2016 1141   ALT 50 01/06/2016 1005   ALKPHOS 65 08/17/2016 1141   ALKPHOS 74 01/06/2016 1005   BILITOT 0.4 08/17/2016 1141   BILITOT 0.46 01/06/2016 1005   GFRNONAA 58 (L) 08/17/2016 1141   GFRAA 66 08/17/2016 1141    TB Gold: negative  (12/11/2015) Hepatitis panel: negative (11/10/2005) HIV: negative (06/28/14)  Chest-xray:  01/22/2016: "IMPRESSION: Normal chest radiographs."   Contraception: post-menopausal  Assessment/Plan:  Patient was restarted on methotrexate.  Reviewed the purpose, proper use, and adverse effects of methotrexate with patient.  Reviewed instructions with patient to take methotrexate weekly along with folic acid daily.  Discussed the importance of frequent monitoring of kidney and liver function and blood counts.  Counseled patient to avoid NSAIDs and alcohol while on methotrexate.  Advised patient to get annual influenza vaccine and to get a pneumococcal vaccine if patient has not already had one.  Patient voiced understanding.  Educated patient on how to use a vial and syringe and reviewed injection technique with patient.  Patient confirms she feels comfortable using vial and syringe to give methotrexate injections.  Provided patient on educational material regarding injection technique and storage of methotrexate.    Lilla Shook, Pharm.D., BCPS Clinical Pharmacist Pager: [phone] Phone: [phone] 08/24/2016 2:17 PM

## 2016-08-25 NOTE — Telephone Encounter (Signed)
Patient advised the reason that was on her AVS is because she is on MTX and Enbrel. Patient verbalized understanding.

## 2016-09-02 DIAGNOSIS — Z794 Long term (current) use of insulin: Secondary | ICD-10-CM | POA: Diagnosis not present

## 2016-09-02 DIAGNOSIS — E109 Type 1 diabetes mellitus without complications: Secondary | ICD-10-CM | POA: Diagnosis not present

## 2016-09-13 ENCOUNTER — Emergency Department (HOSPITAL_BASED_OUTPATIENT_CLINIC_OR_DEPARTMENT_OTHER): Payer: Medicare Other

## 2016-09-13 ENCOUNTER — Encounter (HOSPITAL_BASED_OUTPATIENT_CLINIC_OR_DEPARTMENT_OTHER): Payer: Self-pay

## 2016-09-13 ENCOUNTER — Emergency Department (HOSPITAL_BASED_OUTPATIENT_CLINIC_OR_DEPARTMENT_OTHER)
Admission: EM | Admit: 2016-09-13 | Discharge: 2016-09-13 | Disposition: A | Discharge status: Home / Self Care | Payer: Medicare Other | Attending: Emergency Medicine | Admitting: Emergency Medicine

## 2016-09-13 DIAGNOSIS — R0602 Shortness of breath: Secondary | ICD-10-CM | POA: Diagnosis not present

## 2016-09-13 DIAGNOSIS — Z794 Long term (current) use of insulin: Secondary | ICD-10-CM | POA: Insufficient documentation

## 2016-09-13 DIAGNOSIS — R0600 Dyspnea, unspecified: Secondary | ICD-10-CM

## 2016-09-13 DIAGNOSIS — R0789 Other chest pain: Secondary | ICD-10-CM | POA: Insufficient documentation

## 2016-09-13 DIAGNOSIS — R05 Cough: Secondary | ICD-10-CM | POA: Diagnosis not present

## 2016-09-13 DIAGNOSIS — R079 Chest pain, unspecified: Secondary | ICD-10-CM

## 2016-09-13 DIAGNOSIS — E119 Type 2 diabetes mellitus without complications: Secondary | ICD-10-CM | POA: Diagnosis not present

## 2016-09-13 DIAGNOSIS — Z7982 Long term (current) use of aspirin: Secondary | ICD-10-CM | POA: Insufficient documentation

## 2016-09-13 LAB — CBC
HCT: 38.3 % (ref 36.0–46.0)
Hemoglobin: 12.7 g/dL (ref 12.0–15.0)
MCH: 30 pg (ref 26.0–34.0)
MCHC: 33.2 g/dL (ref 30.0–36.0)
MCV: 90.3 fL (ref 78.0–100.0)
Platelets: 245 10*3/uL (ref 150–400)
RBC: 4.24 MIL/uL (ref 3.87–5.11)
RDW: 14.5 % (ref 11.5–15.5)
WBC: 4.8 10*3/uL (ref 4.0–10.5)

## 2016-09-13 LAB — BASIC METABOLIC PANEL
Anion gap: 9 (ref 5–15)
BUN: 14 mg/dL (ref 6–20)
CO2: 25 mmol/L (ref 22–32)
Calcium: 9.3 mg/dL (ref 8.9–10.3)
Chloride: 106 mmol/L (ref 101–111)
Creatinine, Ser: 1.05 mg/dL — ABNORMAL HIGH (ref 0.44–1.00)
GFR calc Af Amer: >60 mL/min (ref >60)
GFR calc non Af Amer: 57 mL/min — ABNORMAL LOW (ref >60)
Glucose, Bld: 73 mg/dL (ref 65–99)
Potassium: 3.9 mmol/L (ref 3.5–5.1)
Sodium: 140 mmol/L (ref 135–145)

## 2016-09-13 LAB — D-DIMER, QUANTITATIVE (NOT AT ARMC): D-Dimer, Quant: 0.7 ug/mL-FEU — ABNORMAL HIGH (ref 0.00–0.50)

## 2016-09-13 LAB — TROPONIN I: Troponin I: <0.03 ng/mL (ref <0.03)

## 2016-09-13 MED ORDER — IOPAMIDOL (ISOVUE-370) INJECTION 76%
100.0000 mL | Freq: Once | INTRAVENOUS | Status: AC | PRN
  Administered 2016-09-13: 100 mL via INTRAVENOUS

## 2016-09-13 NOTE — ED Provider Notes (Signed)
[facility] DEPT [facility] Provider Note   CSN: [id number] Arrival date & time: 09/13/16  1658   By signing my name below, I, Neta Mends, attest that this documentation has been prepared under the direction and in the presence of Davonna Belling, MD . Electronically Signed: Neta Mends, ED Scribe. 09/13/2016. 5:20 PM.   History   Chief Complaint Chief Complaint  Patient presents with  . Chest Pain   The history is provided by the patient. No language interpreter was used.   HPI Comments:  Becky [name] is a 61 y.o. female with PMHx of DM who presents to the Emergency Department complaining of intermittent chest tightness x 1 month. Pt states that since 2 days ago, the pain has been constant. She states that the pain is worse with deep breaths, and describes the pain as "squeezing." Pt complains of associated dry cough. Pt reports hx of a cardiac catheterization several years ago, and she had an EKG in 2013. Pt denies any sick contacts, and denies recent travel. She is not a smoker. No alleviating factors noted. Pt denies fever, chills, nausea, vomiting, diarrhea, diaphoresis.    Past Medical History:  Diagnosis Date  . Abnormal ECG   . Anemia   . Anxiety   . Cataract    . Cateracts bil eyes removed  . GERD (gastroesophageal reflux disease)   . Heart murmur   . Hypercholesterolemia   . IDDM (insulin dependent diabetes mellitus) ([place]) 1969  . Migraine headache   . Neuromuscular disorder (HCC)    bil legs & feet - neuropathy  . Neuropathy ([place])   . Osteoporosis    10/2012  . Otosclerosis   . RA (rheumatoid arthritis) ([place]) 2000  . S/P cardiac cath August 2013   Normal coronaries and normal LV function.  . Sjoegren syndrome [facility])     Patient Active Problem List   Diagnosis Date Noted  . Rheumatoid arthritis involving multiple sites ([place]) 08/24/2016  . History of joint swelling 08/24/2016  . Arthralgia of both knees 08/24/2016  . History of migraine  08/24/2016  . History of depression 08/24/2016  . History of osteoporosis 08/24/2016  . DDD (degenerative disc disease), lumbar 08/24/2016  . High risk medication use 08/24/2016  . Trigger finger, left middle finger 08/24/2016  . Trigger finger, right ring finger 08/24/2016  . 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 Increased risk of breast cancer 07/06/2015  . IDDM (insulin dependent diabetes mellitus) ([place]) 07/06/2015  . 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 Sjogren's syndrome ([place]) 07/06/2015  . Genetic testing 05/12/2015  . Family history of breast cancer 04/15/2015  . Fibrocystic breast disease 10/11/2012  . GERD 10/01/2009  . Chest pain with moderate risk for cardiac etiology 09/02/2009  . DYSPHAGIA 09/02/2009  . DIABETES MELLITUS-TYPE II 08/15/2009  . ANXIETY 08/15/2009  . ULCER-GASTRIC 08/15/2009  . CONSTIPATION 08/15/2009  . SLOW TRANSIT CONSTIPATION 08/15/2009  . FLATULENCE ERUCTATION AND GAS PAIN 08/15/2009    Past Surgical History:  Procedure Laterality Date  . BREAST LUMPECTOMY  1995  . CARPAL TUNNEL RELEASE  2000  . CATARACT EXTRACTION Right 09-05-12  . CATARACT EXTRACTION Left 3/15  . CESAREAN SECTION  1987  . COLONOSCOPY    . EYE SURGERY     diabetic retinopathy   . HYSTEROSCOPY  02/2001   and D & C  . LYMPH NODE DISSECTION  2002  . TOE SURGERY  2000   bone removed    OB History    Gravida Para Term  Preterm AB Living   _0 0 0 1   SAB TAB Ectopic Multiple Live Births   0 0 0 0 1       Home Medications    Prior to Admission medications   Medication Sig Start Date End Date Taking? Authorizing Provider  aspirin 81 MG tablet Take 81 mg by mouth daily.   Yes Historical Provider, MD  calcium carbonate (OS-CAL) 600 MG TABS Take 600 mg by mouth 2 (two) times daily with a meal.    Yes Historical Provider, MD  cyclobenzaprine (FLEXERIL) 10 MG tablet Take 10 mg by mouth 3 (three) times daily as needed for muscle spasms. Reported on 07/03/2015   Yes Historical  Provider, MD  DULoxetine (CYMBALTA) 60 MG capsule Take 60 mg by mouth daily.   Yes Historical Provider, MD  Flaxseed Oil OIL Take 1,200 mg by mouth 2 (two) times daily.   Yes Historical Provider, MD  folic acid (FOLVITE) 1 MG tablet Take 2 tablets (2 mg total) by mouth daily. 08/24/16  Yes Naitik Panwala, PA-C  furosemide (LASIX) 20 MG tablet Take 20 mg by mouth every other day.   Yes Historical Provider, MD  insulin aspart (NOVOLOG) 100 UNIT/ML injection Insulin pump   Yes Historical Provider, MD  losartan (COZAAR) 25 MG tablet Take 50 mg by mouth daily.    Yes Historical Provider, MD  Magnesium 250 MG TABS Take by mouth.   Yes Historical Provider, MD  methotrexate 50 MG/2ML injection Inject 0.8 mLs (20 mg total) into the skin once a week. 08/24/16  Yes Naitik Panwala, PA-C  Misc Natural Products (TURMERIC CURCUMIN) CAPS Take by mouth.   Yes Historical Provider, MD  Multiple Vitamin (MULTIVITAMIN) tablet Take 1 tablet by mouth daily.   Yes Historical Provider, MD  POLYETHYLENE GLYCOL 3350 PO Take by mouth 2 (two) times daily.    Yes Historical Provider, MD  pravastatin (PRAVACHOL) 20 MG tablet Take 40 mg by mouth daily.    Yes Historical Provider, MD  Probiotic Product (PROBIOTIC DAILY PO) Take by mouth.   Yes Historical Provider, MD  SUMAtriptan (IMITREX) 100 MG tablet Take 100 mg by mouth every 2 (two) hours as needed.   Yes Historical Provider, MD  topiramate (TOPAMAX) 200 MG tablet Take 200 mg by mouth 2 (two) times daily.   Yes Historical Provider, MD  vitamin B-12 (CYANOCOBALAMIN) 1000 MCG tablet Take 1,000 mcg by mouth daily.   Yes Historical Provider, MD  BLACK CURRANT SEED OIL PO Take by mouth.    Historical Provider, MD  cycloSPORINE (RESTASIS) 0.05 % ophthalmic emulsion Place 1 drop into both eyes 2 (two) times daily.    Historical Provider, MD  etanercept (ENBREL SURECLICK) 50 MG/ML injection Inject 0.98 mLs (50 mg total) into the skin once a week. Patient not taking: Reported on  08/24/2016 05/19/16   Bo Merino, MD  Misc Natural Products (TART CHERRY ADVANCED PO) Take by mouth.    Historical Provider, MD  Tuberculin-Allergy Syringes 27G X 1/2" 1 ML KIT Inject 1 Syringe into the skin once a week. To be used with weekly methotrexate injections. 08/24/16   Eliezer Lofts, PA-C    Family History Family History  Problem Relation Age of Onset  . Breast cancer Mother 17  . Colon polyps Mother     unspecified number  . Breast cancer Maternal Grandmother 39  . Stroke Father 53  . Rheum arthritis Sister   . Breast cancer Sister 48    lump; (-)  GT (breast/ova ca panel, GeneDx)  . Skin cancer Maternal Grandfather     unspecified type  . Prostate cancer Maternal Grandfather     dx. late 59s  . Breast cancer Other     dx. in their late 43s  . Breast cancer Other     dx. 78-79  . Breast cancer Cousin     bilateral; dx. late 72s  . Colon cancer Neg Hx   . Esophageal cancer Neg Hx   . Rectal cancer Neg Hx   . Stomach cancer Neg Hx     Social History Social History  Substance Use Topics  . Smoking status: Never Smoker  . Smokeless tobacco: Never Used  . Alcohol use 2.4 oz/week    2 Shots of liquor, 2 Standard drinks or equivalent per week     Comment: maybe 1-2 cocktails per wk     Allergies   Ciprofloxacin and Penicillins   Review of Systems Review of Systems  Constitutional: Negative for chills and fever.  Respiratory: Positive for chest tightness. Negative for cough.   Gastrointestinal: Negative for diarrhea, nausea and vomiting.  All other systems reviewed and are negative.    Physical Exam Updated Vital Signs BP (!) 154/64 (BP Location: Right Arm)   Pulse 86   Temp 98.3 F (36.8 C) (Oral)   Resp 18   Ht _0  (1.626 m)   Wt 125 lb (56.7 kg)   LMP 09/19/2008   SpO2 98%   BMI 21.46 kg/m   Physical Exam  Constitutional: She appears well-developed and well-nourished. No distress.  HENT:  Head: Normocephalic and atraumatic.  Eyes:  Conjunctivae are normal.  Cardiovascular: Normal rate.   Systolic murmur, loudest at right upper sternal border.  Pulmonary/Chest: Effort normal. She has no rales.  No dyspnea. Transmitted GI sounds.   Abdominal: She exhibits no distension. There is no tenderness.  Musculoskeletal: She exhibits no edema.  Neurological: She is alert.  Skin: Skin is warm and dry.  Psychiatric: She has a normal mood and affect.  Nursing note and vitals reviewed.    ED Treatments / Results  Labs (all labs ordered are listed, but only abnormal results are displayed) Labs Reviewed  BASIC METABOLIC PANEL - Abnormal; Notable for the following:       Result Value   Creatinine, Ser 1.05 (*)    GFR calc non Af Amer 57 (*)    All other components within normal limits  D-DIMER, QUANTITATIVE (NOT AT [facility]) - Abnormal; Notable for the following:    D-Dimer, Quant 0.70 (*)    All other components within normal limits  CBC  TROPONIN I    EKG  EKG Interpretation  Date/Time:  Monday September 13 2016 17:06:43 EDT Ventricular Rate:  88 PR Interval:    QRS Duration: 87 QT Interval:  361 QTC Calculation: 437 R Axis:   74 Text Interpretation:  Sinus rhythm Low voltage, precordial leads Baseline wander in lead(s) I III aVL aVF No significant change since last tracing Confirmed by Alvino Chapel  MD, Shanik Brookshire [phone]) on 09/13/2016 5:13:29 PM       Radiology Dg Chest 2 View  Result Date: 09/13/2016 CLINICAL DATA:  Intermittent chest tightness for 1 month. Worsening over the last 2 days. EXAM: CHEST  2 VIEW COMPARISON:  None. FINDINGS: The lungs are clear wiithout focal pneumonia, edema, pneumothorax or pleural effusion. The cardiopericardial silhouette is within normal limits for size. The visualized bony structures of the thorax are intact. Telemetry leads overlie  the chest. IMPRESSION: No active cardiopulmonary disease. Electronically Signed   By: Misty Stanley M.D.   On: 09/13/2016 18:42   Ct Angio Chest Pe W And/or Wo  Contrast  Result Date: 09/13/2016 CLINICAL DATA:  Lateral chest pain for past month making a heart attack 80 breath, pain since Friday, history diabetes mellitus EXAM: CT ANGIOGRAPHY CHEST WITH CONTRAST TECHNIQUE: Multidetector CT imaging of the chest was performed using the standard protocol during bolus administration of intravenous contrast. Multiplanar CT image reconstructions and MIPs were obtained to evaluate the vascular anatomy. CONTRAST:  100 cc Isovue 370 IV COMPARISON:  None FINDINGS: Cardiovascular: Aorta normal caliber without aneurysm or dissection. Pulmonary arteries well opacified and patent. No evidence of pulmonary embolism. No pericardial effusion. Mediastinum/Nodes: Base of cervical region unremarkable. Scattered air throughout esophagus without significant hiatal hernia. No thoracic adenopathy. Lungs/Pleura: Lungs clear. No pulmonary infiltrate, pleural effusion or pneumothorax. Upper Abdomen: Visualized upper abdomen unremarkable. Musculoskeletal: No acute osseous findings. Review of the MIP images confirms the above findings. IMPRESSION: Normal exam. Electronically Signed   By: Lavonia Dana M.D.   On: 09/13/2016 20:27    Procedures Procedures (including critical care time)  Medications Ordered in ED Medications  iopamidol (ISOVUE-370) 76 % injection 100 mL (100 mLs Intravenous Contrast Given 09/13/16 2007)     Initial Impression / Assessment and Plan / ED Course  I have reviewed the triage vital signs and the nursing notes.  Pertinent labs & imaging results that were available during my care of the patient were reviewed by me and considered in my medical decision making (see chart for details).     Patient with chest pain and shortness of breath. EKG reassuring. D-dimer done due to dyspnea. It was elevated CT scan done which is reassuring. Will discharge home. Has previous cardiac cath that was normal coronaries. It was around 5 years ago. She does however have rheumatoid  arthritis. Will discharge home to follow-up with PCP.  Final Clinical Impressions(s) / ED Diagnoses   Final diagnoses:  Dyspnea, unspecified type  Chest pain, unspecified type    New Prescriptions New Prescriptions   No medications on file  I personally performed the services described in this documentation, which was scribed in my presence. The recorded information has been reviewed and is accurate.       Davonna Belling, MD 09/13/16 2044

## 2016-09-13 NOTE — Discharge Instructions (Signed)
Follow-up with Dr. Evlyn Kanner.

## 2016-09-13 NOTE — ED Notes (Signed)
ED Provider at bedside. 

## 2016-09-13 NOTE — ED Triage Notes (Signed)
Pt c/o intermittent lateral chest pain for the last month that makes it hard for her to take a deep breath.  The pain and SOB has been consistent since this past Friday with pain referred to the middle of her back and she had nausea after lunch today.

## 2016-09-13 NOTE — ED Notes (Signed)
Patient returned from CT. Patient drinking beverage from home. OK'd by MD.

## 2016-09-13 NOTE — ED Notes (Signed)
Patient transported to X-ray 

## 2016-09-13 NOTE — ED Notes (Signed)
Patient in CT at this time.

## 2016-09-27 DIAGNOSIS — L300 Nummular dermatitis: Secondary | ICD-10-CM | POA: Diagnosis not present

## 2016-09-29 DIAGNOSIS — G43719 Chronic migraine without aura, intractable, without status migrainosus: Secondary | ICD-10-CM | POA: Diagnosis not present

## 2016-09-29 DIAGNOSIS — G518 Other disorders of facial nerve: Secondary | ICD-10-CM | POA: Diagnosis not present

## 2016-09-29 DIAGNOSIS — M542 Cervicalgia: Secondary | ICD-10-CM | POA: Diagnosis not present

## 2016-09-29 DIAGNOSIS — M791 Myalgia: Secondary | ICD-10-CM | POA: Diagnosis not present

## 2016-10-06 ENCOUNTER — Other Ambulatory Visit: Payer: Self-pay | Admitting: Otolaryngology

## 2016-10-06 DIAGNOSIS — H9191 Unspecified hearing loss, right ear: Secondary | ICD-10-CM | POA: Diagnosis not present

## 2016-10-06 DIAGNOSIS — H8093 Unspecified otosclerosis, bilateral: Secondary | ICD-10-CM | POA: Diagnosis not present

## 2016-10-06 DIAGNOSIS — H8001 Otosclerosis involving oval window, nonobliterative, right ear: Secondary | ICD-10-CM | POA: Diagnosis not present

## 2016-10-06 DIAGNOSIS — H9193 Unspecified hearing loss, bilateral: Secondary | ICD-10-CM | POA: Diagnosis not present

## 2016-10-06 DIAGNOSIS — H906 Mixed conductive and sensorineural hearing loss, bilateral: Secondary | ICD-10-CM | POA: Diagnosis not present

## 2016-10-20 DIAGNOSIS — H04123 Dry eye syndrome of bilateral lacrimal glands: Secondary | ICD-10-CM | POA: Diagnosis not present

## 2016-11-03 DIAGNOSIS — E1040 Type 1 diabetes mellitus with diabetic neuropathy, unspecified: Secondary | ICD-10-CM | POA: Diagnosis not present

## 2016-11-03 DIAGNOSIS — N08 Glomerular disorders in diseases classified elsewhere: Secondary | ICD-10-CM | POA: Diagnosis not present

## 2016-11-03 DIAGNOSIS — E1142 Type 2 diabetes mellitus with diabetic polyneuropathy: Secondary | ICD-10-CM | POA: Diagnosis not present

## 2016-11-03 DIAGNOSIS — E113599 Type 2 diabetes mellitus with proliferative diabetic retinopathy without macular edema, unspecified eye: Secondary | ICD-10-CM | POA: Diagnosis not present

## 2016-11-08 ENCOUNTER — Telehealth (INDEPENDENT_AMBULATORY_CARE_PROVIDER_SITE_OTHER): Payer: Self-pay | Admitting: Radiology

## 2016-11-08 NOTE — Telephone Encounter (Signed)
Message  Due: 1 week ago  Received: 1 week ago  Message Contents  Audrie Lia, RT  Stefano Trulson, RT        new   Previous Messages    ----- Message -----  From: Tawni Pummel, PA-C  Sent: 08/24/2016  2:35 PM  To: Audrie Lia, RT   Apply for Euflex at 3 bilateral knees   Patient received Euflex at approximately 2016 and did well until now.      Please apply for Euflexxa x 3 bilateral knees, pt has [employer] [place], thanks.

## 2016-11-09 ENCOUNTER — Telehealth (INDEPENDENT_AMBULATORY_CARE_PROVIDER_SITE_OTHER): Payer: Self-pay

## 2016-11-09 DIAGNOSIS — G518 Other disorders of facial nerve: Secondary | ICD-10-CM | POA: Diagnosis not present

## 2016-11-09 DIAGNOSIS — M542 Cervicalgia: Secondary | ICD-10-CM | POA: Diagnosis not present

## 2016-11-09 DIAGNOSIS — G43719 Chronic migraine without aura, intractable, without status migrainosus: Secondary | ICD-10-CM | POA: Diagnosis not present

## 2016-11-09 DIAGNOSIS — M791 Myalgia: Secondary | ICD-10-CM | POA: Diagnosis not present

## 2016-11-09 NOTE — Telephone Encounter (Signed)
Faxed Euflexxa Application to [phone] for VOB. 

## 2016-11-09 NOTE — Telephone Encounter (Signed)
Faxed Euflexxa Application to [phone] for VOB.

## 2016-11-16 DIAGNOSIS — Z803 Family history of malignant neoplasm of breast: Secondary | ICD-10-CM | POA: Diagnosis not present

## 2016-11-16 DIAGNOSIS — Z1231 Encounter for screening mammogram for malignant neoplasm of breast: Secondary | ICD-10-CM | POA: Diagnosis not present

## 2016-11-29 ENCOUNTER — Ambulatory Visit: Payer: Medicare Other | Admitting: Rheumatology

## 2016-12-07 ENCOUNTER — Telehealth (INDEPENDENT_AMBULATORY_CARE_PROVIDER_SITE_OTHER): Payer: Self-pay

## 2016-12-07 NOTE — Telephone Encounter (Signed)
Called and left voicemail for patient to return call concerning Euflexxa Injections.

## 2016-12-08 ENCOUNTER — Telehealth (INDEPENDENT_AMBULATORY_CARE_PROVIDER_SITE_OTHER): Payer: Self-pay

## 2016-12-08 NOTE — Telephone Encounter (Signed)
Patient returned call concerning Euflexxa Injections.  Patient stated she will return our call after speaking with her insurance to let us know if she would like to proceed.  Cb# is [phone].

## 2016-12-10 ENCOUNTER — Encounter: Payer: Self-pay | Admitting: Obstetrics and Gynecology

## 2016-12-28 DIAGNOSIS — H903 Sensorineural hearing loss, bilateral: Secondary | ICD-10-CM | POA: Diagnosis not present

## 2016-12-30 ENCOUNTER — Telehealth: Payer: Self-pay | Admitting: Oncology

## 2016-12-30 DIAGNOSIS — M791 Myalgia: Secondary | ICD-10-CM | POA: Diagnosis not present

## 2016-12-30 DIAGNOSIS — M542 Cervicalgia: Secondary | ICD-10-CM | POA: Diagnosis not present

## 2016-12-30 DIAGNOSIS — G43719 Chronic migraine without aura, intractable, without status migrainosus: Secondary | ICD-10-CM | POA: Diagnosis not present

## 2016-12-30 DIAGNOSIS — G518 Other disorders of facial nerve: Secondary | ICD-10-CM | POA: Diagnosis not present

## 2016-12-30 NOTE — Telephone Encounter (Signed)
lvm about appointment change from 7/25 to 7/24 with a call back number

## 2016-12-31 NOTE — Telephone Encounter (Signed)
Patient called and r/s appointment on 8/24 due to being out of town to 8/13 at 10 am

## 2017-01-03 DIAGNOSIS — M810 Age-related osteoporosis without current pathological fracture: Secondary | ICD-10-CM | POA: Diagnosis not present

## 2017-01-04 ENCOUNTER — Other Ambulatory Visit: Payer: Self-pay

## 2017-01-04 DIAGNOSIS — H209 Unspecified iridocyclitis: Secondary | ICD-10-CM | POA: Insufficient documentation

## 2017-01-04 DIAGNOSIS — N6019 Diffuse cystic mastopathy of unspecified breast: Secondary | ICD-10-CM

## 2017-01-04 NOTE — Progress Notes (Signed)
Office Visit Note  [name]             Date of Birth: Jan 15, 1956           MRN: [id number]             PCP: Reynold Bowen, MD Referring: Reynold Bowen, MD Visit Date: 01/06/2017 Occupation: '@GUAROCC' @    Subjective:  No chief complaint on file.   History of Present Illness: Becky [name] is a 61 y.o. female with history of him rheumatoid arthritis. She states she decided to come off her Enbrel and methotrexate in the beginning of the year. She tried natural anti-inflammatories for a while. In March 2018 as she was having increase arthralgias she resumed her methotrexate. Which she had to undergo some ear surgery for which she came off the methotrexate again for 3-4 weeks. As her joint symptoms returned she restarted methotrexate and Enbrel in April 2018. She's been on the combination therapy since then. She's been having increased pain recently. She describes pain in her C-spine, lumbar spine, bilateral hands and bilateral knee joints. She states in the morning in her hands are swollen.  Activities of Daily Living:  Patient reports morning stiffness for 2 hours.   Patient Reports nocturnal pain.  Difficulty dressing/grooming: Denies Difficulty climbing stairs: Reports Difficulty getting out of chair: Reports Difficulty using hands for taps, buttons, cutlery, and/or writing: Reports   Review of Systems  Constitutional: Positive for fatigue. Negative for night sweats, weight gain, weight loss and weakness.  HENT: Positive for mouth dryness. Negative for mouth sores, trouble swallowing, trouble swallowing and nose dryness.   Eyes: Negative for pain, redness, visual disturbance and dryness.  Respiratory: Negative for cough, shortness of breath and difficulty breathing.   Cardiovascular: Negative for chest pain, palpitations, hypertension, irregular heartbeat and swelling in legs/feet.  Gastrointestinal: Positive for constipation. Negative for blood in stool and  diarrhea.  Endocrine: Negative for increased urination.  Genitourinary: Negative for vaginal dryness.  Musculoskeletal: Positive for arthralgias, joint pain, joint swelling and morning stiffness. Negative for myalgias, muscle weakness, muscle tenderness and myalgias.  Skin: Negative for color change, rash, hair loss, skin tightness, ulcers and sensitivity to sunlight.  Allergic/Immunologic: Negative for susceptible to infections.  Neurological: Negative for dizziness, memory loss and night sweats.  Hematological: Negative for swollen glands.  Psychiatric/Behavioral: Negative for depressed mood and sleep disturbance. The patient is not nervous/anxious.     PMFS History:  Patient Active Problem List   Diagnosis Date Noted  . Iritis 01/04/2017  . History of joint swelling 08/24/2016  . Osteoarthritis of knees, bilateral 08/24/2016  . History of migraine 08/24/2016  . History of depression 08/24/2016  . History of osteoporosis 08/24/2016  . DDD (degenerative disc disease), lumbar 08/24/2016  . High risk medication use 08/24/2016  . Trigger finger, left middle finger 08/24/2016  . Trigger finger, right ring finger 08/24/2016  . 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 Increased risk of breast cancer 07/06/2015  . IDDM (insulin dependent diabetes mellitus) ([place]) 07/06/2015  . 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 without organ or systems involvement ([place]) 07/06/2015  . Sjogren's syndrome ([place]) 07/06/2015  . Genetic testing 05/12/2015  . Family history of breast cancer 04/15/2015  . Fibrocystic breast disease 10/11/2012  . GERD 10/01/2009  . Chest pain with moderate risk for cardiac etiology 09/02/2009  . DYSPHAGIA 09/02/2009  . DIABETES MELLITUS-TYPE II 08/15/2009  . ANXIETY 08/15/2009  . ULCER-GASTRIC 08/15/2009  . CONSTIPATION 08/15/2009  .  SLOW TRANSIT CONSTIPATION 08/15/2009  . FLATULENCE ERUCTATION AND GAS PAIN 08/15/2009    Past Medical History:    Diagnosis Date  . Abnormal ECG   . Anemia   . Anxiety   . Cataract    . Cateracts bil eyes removed  . GERD (gastroesophageal reflux disease)   . Heart murmur   . Hypercholesterolemia   . IDDM (insulin dependent diabetes mellitus) ([place]) 1969  . Migraine headache   . Neuromuscular disorder (HCC)    bil legs & feet - neuropathy  . Neuropathy   . Osteoporosis    10/2012  . Otosclerosis   . RA (rheumatoid arthritis) ([place]) 2000  . S/P cardiac cath August 2013   Normal coronaries and normal LV function.  . Sjoegren syndrome ([place])     Family History  Problem Relation Age of Onset  . Breast cancer Mother 28  . Colon polyps Mother        unspecified number  . Breast cancer Maternal Grandmother 68  . Stroke Father 69  . Rheum arthritis Sister   . Breast cancer Sister 48       lump; (-)GT (breast/ova ca panel, GeneDx)  . Skin cancer Maternal Grandfather        unspecified type  . Prostate cancer Maternal Grandfather        dx. late 54s  . Breast cancer Other        dx. in their late 65s  . Breast cancer Other        dx. 78-79  . Breast cancer Cousin        bilateral; dx. late 68s  . Colon cancer Neg Hx   . Esophageal cancer Neg Hx   . Rectal cancer Neg Hx   . Stomach cancer Neg Hx    Past Surgical History:  Procedure Laterality Date  . BREAST LUMPECTOMY  1995  . CARPAL TUNNEL RELEASE  2000  . CATARACT EXTRACTION Right 09-05-12  . CATARACT EXTRACTION Left 3/15  . CESAREAN SECTION  1987  . COLONOSCOPY    . EYE SURGERY     diabetic retinopathy   . HYSTEROSCOPY  02/2001   and D & C  . LYMPH NODE DISSECTION  2002  . TOE SURGERY  2000   bone removed   Social History   Social History Narrative  . No narrative on file     Objective: Vital Signs: BP (!) 105/57   Pulse 91   Resp 14   Ht '5\' 4"'  (1.626 m)   Wt 130 lb (59 kg)   LMP 09/19/2008   BMI 22.31 kg/m    Physical Exam  Constitutional: She is oriented to person, place, and time. She appears well-developed  and well-nourished.  HENT:  Head: Normocephalic and atraumatic.  Eyes: Conjunctivae and EOM are normal.  Neck: Normal range of motion.  Cardiovascular: Normal rate, regular rhythm, normal heart sounds and intact distal pulses.   Pulmonary/Chest: Effort normal and breath sounds normal.  Abdominal: Soft. Bowel sounds are normal.  Lymphadenopathy:    She has no cervical adenopathy.  Neurological: She is alert and oriented to person, place, and time.  Skin: Skin is warm and dry. Capillary refill takes less than 2 seconds.  Psychiatric: She has a normal mood and affect. Her behavior is normal.  Nursing note and vitals reviewed.    Musculoskeletal Exam: C-spine and thoracic lumbar spine limited range of motion with some discomfort. Shoulder joints although joints wrist joint MCPs PIPs DIPs good range  of motion with no synovitis. Hip joints knee joints ankles MTPs PIPs DIPs with good range of motion with no synovitis. She has some discomfort range of motion of her hip joints and knee joints.  CDAI Exam: CDAI Homunculus Exam:   Joint Counts:  CDAI Tender Joint count: 0 CDAI Swollen Joint count: 0  Global Assessments:  Patient Global Assessment: 6 Provider Global Assessment: 2  CDAI Calculated Score: 8    Investigation: Findings:  11/12/2005 negative Hepatitis Panel  02/17/2001 Positive HLA B 27  12/15/2015 negative TB gold  01/05/2017 CBC normal, CMP GFR 64 Imaging: No results found.  Speciality Comments: No specialty comments available.    Procedures:  No procedures performed Allergies: Ciprofloxacin and Penicillins   Assessment / Plan:     Visit Diagnoses: 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 without organ or systems involvement [facility]): She's been having multiple arthralgias but no synovitis on examination. I believe most of her discomfort is coming from osteoarthritis. I've given her handout on exercises for hip joint and knee joints.  Iritis: No  recurrence: Her eye symptoms are better with the new eyedrops. She still have some dry mouth.  Sjogren's syndrome with keratoconjunctivitis sicca (HCC)  High risk medication use - Enbrel 50 mg by mouth every week, methotrexate 0.8 ML subcutaneous every week, folic acid 2 mg by mouth daily(Orencia -discontinued due to the cost) - Plan: Quantiferon tb gold assay (blood), CBC with Differential/Platelet, COMPLETE METABOLIC PANEL WITH GFR will be in October and then every 3 months  Trigger finger, left middle finger and Trigger finger, right ring finger: She's having symptoms off and on.  Primary osteoarthritis of both knees: Chronic pain  DDD (degenerative disc disease), lumbar: Chronic pain  History of osteoporosis on Prolia per Dr. Forde Dandy   History of depression: Better on Cymbalta  History of migraine  History of diabetes mellitus  History of gastroesophageal reflux (GERD)  Increased risk of breast cancer    Orders: Orders Placed This Encounter  Procedures  . Quantiferon tb gold assay (blood)  . CBC with Differential/Platelet  . COMPLETE METABOLIC PANEL WITH GFR   No orders of the defined types were placed in this encounter.   Face-to-face time spent with patient was 30 minutes. 50% of time was spent in counseling and coordination of care.  Follow-Up Instructions: Return in about 5 months (around 06/08/2017) for Rheumatoid arthritis.   Bo Merino, MD  Note - This record has been created using Editor, commissioning.  Chart creation errors have been sought, but may not always  have been located. Such creation errors do not reflect on  the standard of medical care.

## 2017-01-05 ENCOUNTER — Other Ambulatory Visit (HOSPITAL_BASED_OUTPATIENT_CLINIC_OR_DEPARTMENT_OTHER): Payer: Medicare Other

## 2017-01-05 DIAGNOSIS — D721 Eosinophilia: Secondary | ICD-10-CM

## 2017-01-05 DIAGNOSIS — N6019 Diffuse cystic mastopathy of unspecified breast: Secondary | ICD-10-CM

## 2017-01-05 LAB — CBC WITH DIFFERENTIAL/PLATELET
BASO%: 1.3 % (ref 0.0–2.0)
Basophils Absolute: 0.1 10*3/uL (ref 0.0–0.1)
EOS%: 9.3 % — ABNORMAL HIGH (ref 0.0–7.0)
Eosinophils Absolute: 0.5 10*3/uL (ref 0.0–0.5)
HCT: 41.9 % (ref 34.8–46.6)
HGB: 13.6 g/dL (ref 11.6–15.9)
LYMPH%: 31.1 % (ref 14.0–49.7)
MCH: 30.4 pg (ref 25.1–34.0)
MCHC: 32.4 g/dL (ref 31.5–36.0)
MCV: 94 fL (ref 79.5–101.0)
MONO#: 0.5 10*3/uL (ref 0.1–0.9)
MONO%: 10.6 % (ref 0.0–14.0)
NEUT#: 2.5 10*3/uL (ref 1.5–6.5)
NEUT%: 47.7 % (ref 38.4–76.8)
Platelets: 262 10*3/uL (ref 145–400)
RBC: 4.46 10*6/uL (ref 3.70–5.45)
RDW: 16.7 % — ABNORMAL HIGH (ref 11.2–14.5)
WBC: 5.2 10*3/uL (ref 3.9–10.3)
lymph#: 1.6 10*3/uL (ref 0.9–3.3)

## 2017-01-05 LAB — COMPREHENSIVE METABOLIC PANEL
ALT: 18 U/L (ref 0–55)
AST: 24 U/L (ref 5–34)
Albumin: 3.9 g/dL (ref 3.5–5.0)
Alkaline Phosphatase: 67 U/L (ref 40–150)
Anion Gap: 8 mEq/L (ref 3–11)
BUN: 11.4 mg/dL (ref 7.0–26.0)
CO2: 24 mEq/L (ref 22–29)
Calcium: 9.7 mg/dL (ref 8.4–10.4)
Chloride: 109 mEq/L (ref 98–109)
Creatinine: 1 mg/dL (ref 0.6–1.1)
EGFR: 64 mL/min/{1.73_m2} — ABNORMAL LOW (ref >90)
Glucose: 95 mg/dl (ref 70–140)
Potassium: 4.3 mEq/L (ref 3.5–5.1)
Sodium: 142 mEq/L (ref 136–145)
Total Bilirubin: 0.26 mg/dL (ref 0.20–1.20)
Total Protein: 7.1 g/dL (ref 6.4–8.3)

## 2017-01-06 ENCOUNTER — Ambulatory Visit (INDEPENDENT_AMBULATORY_CARE_PROVIDER_SITE_OTHER): Payer: Medicare Other | Admitting: Rheumatology

## 2017-01-06 ENCOUNTER — Encounter: Payer: Self-pay | Admitting: Rheumatology

## 2017-01-06 VITALS — BP 105/57 | HR 91 | Resp 14 | Ht 64.0 in | Wt 130.0 lb

## 2017-01-06 DIAGNOSIS — Z8719 Personal history of other diseases of the digestive system: Secondary | ICD-10-CM

## 2017-01-06 DIAGNOSIS — M0579 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 without organ or systems involvement: Secondary | ICD-10-CM

## 2017-01-06 DIAGNOSIS — Z8659 Personal history of other mental and behavioral disorders: Secondary | ICD-10-CM | POA: Diagnosis not present

## 2017-01-06 DIAGNOSIS — Z79899 Other long term (current) drug therapy: Secondary | ICD-10-CM | POA: Diagnosis not present

## 2017-01-06 DIAGNOSIS — Z8669 Personal history of other diseases of the nervous system and sense organs: Secondary | ICD-10-CM

## 2017-01-06 DIAGNOSIS — Z8739 Personal history of other diseases of the musculoskeletal system and connective tissue: Secondary | ICD-10-CM

## 2017-01-06 DIAGNOSIS — Z9189 Other specified personal risk factors, not elsewhere classified: Secondary | ICD-10-CM

## 2017-01-06 DIAGNOSIS — M170 Bilateral primary osteoarthritis of knee: Secondary | ICD-10-CM

## 2017-01-06 DIAGNOSIS — M65341 Trigger finger, right ring finger: Secondary | ICD-10-CM | POA: Diagnosis not present

## 2017-01-06 DIAGNOSIS — H209 Unspecified iridocyclitis: Secondary | ICD-10-CM | POA: Diagnosis not present

## 2017-01-06 DIAGNOSIS — M5136 Other intervertebral disc degeneration, lumbar region: Secondary | ICD-10-CM | POA: Diagnosis not present

## 2017-01-06 DIAGNOSIS — M3501 Sicca syndrome with keratoconjunctivitis: Secondary | ICD-10-CM

## 2017-01-06 DIAGNOSIS — M65332 Trigger finger, left middle finger: Secondary | ICD-10-CM

## 2017-01-06 DIAGNOSIS — Z8639 Personal history of other endocrine, nutritional and metabolic disease: Secondary | ICD-10-CM

## 2017-01-06 MED ORDER — ETANERCEPT 50 MG/ML ~~LOC~~ SOAJ: 50.0000 mg | SUBCUTANEOUS | 0 refills | Status: DC

## 2017-01-06 NOTE — Patient Instructions (Addendum)
Standing Labs We placed an order today for your standing lab work.    Please come back and get your standing labs in October and every 3 months [facility] with the next labs  We have open lab Monday through Friday from 8:30-11:30 AM and 1:30-4 PM at the office of Dr. Pollyann Savoy.   The office is located at [address], [place], [place] [address] No appointment is necessary.   Labs are drawn by [facility].  You may receive a bill from [facility] for your lab work. If you have any questions regarding directions or hours of operation,  please call [phone].    Knee Exercises Ask your health care provider which exercises are safe for you. Do exercises exactly as told by your health care provider and adjust them as directed. It is normal to feel mild stretching, pulling, tightness, or discomfort as you do these exercises, but you should stop right away if you feel sudden pain or your pain gets worse.Do not begin these exercises until told by your health care provider. STRETCHING AND RANGE OF MOTION EXERCISES These exercises warm up your muscles and joints and improve the movement and flexibility of your knee. These exercises also help to relieve pain, numbness, and tingling. Exercise A: Knee Extension, Prone 1. Lie on your abdomen on a bed. 2. Place your left / right knee just beyond the edge of the surface so your knee is not on the bed. You can put a towel under your left / right thigh just above your knee for comfort. 3. Relax your leg muscles and allow gravity to straighten your knee. You should feel a stretch behind your left / right knee. 4. Hold this position for __________ seconds. 5. Scoot up so your knee is supported between repetitions. Repeat __________ times. Complete this stretch __________ times a day. Exercise B: Knee Flexion, Active  1. Lie on your back with both knees straight. If this causes back discomfort, bend your left / right knee so your foot is flat on the  floor. 2. Slowly slide your left / right heel back toward your buttocks until you feel a gentle stretch in the front of your knee or thigh. 3. Hold this position for __________ seconds. 4. Slowly slide your left / right heel back to the starting position. Repeat __________ times. Complete this exercise __________ times a day. Exercise C: Quadriceps, Prone  1. Lie on your abdomen on a firm surface, such as a bed or padded floor. 2. Bend your left / right knee and hold your ankle. If you cannot reach your ankle or pant leg, loop a belt around your foot and grab the belt instead. 3. Gently pull your heel toward your buttocks. Your knee should not slide out to the side. You should feel a stretch in the front of your thigh and knee. 4. Hold this position for __________ seconds. Repeat __________ times. Complete this stretch __________ times a day. Exercise D: Hamstring, Supine 1. Lie on your back. 2. Loop a belt or towel over the ball of your left / right foot. The ball of your foot is on the walking surface, right under your toes. 3. Straighten your left / right knee and slowly pull on the belt to raise your leg until you feel a gentle stretch behind your knee. ? Do not let your left / right knee bend while you do this. ? Keep your other leg flat on the floor. 4. Hold this position for  __________ seconds. Repeat __________ times. Complete this stretch __________ times a day. STRENGTHENING EXERCISES These exercises build strength and endurance in your knee. Endurance is the ability to use your muscles for a long time, even after they get tired. Exercise E: Quadriceps, Isometric  1. Lie on your back with your left / right leg extended and your other knee bent. Put a rolled towel or small pillow under your knee if told by your health care provider. 2. Slowly tense the muscles in the front of your left / right thigh. You should see your kneecap slide up toward your hip or see increased dimpling just  above the knee. This motion will push the back of the knee toward the floor. 3. For __________ seconds, keep the muscle as tight as you can without increasing your pain. 4. Relax the muscles slowly and completely. Repeat __________ times. Complete this exercise __________ times a day. Exercise F: Straight Leg Raises - Quadriceps 1. Lie on your back with your left / right leg extended and your other knee bent. 2. Tense the muscles in the front of your left / right thigh. You should see your kneecap slide up or see increased dimpling just above the knee. Your thigh may even shake a bit. 3. Keep these muscles tight as you raise your leg 4-6 inches (10-15 cm) off the floor. Do not let your knee bend. 4. Hold this position for __________ seconds. 5. Keep these muscles tense as you lower your leg. 6. Relax your muscles slowly and completely after each repetition. Repeat __________ times. Complete this exercise __________ times a day. Exercise G: Hamstring, Isometric 1. Lie on your back on a firm surface. 2. Bend your left / right knee approximately __________ degrees. 3. Dig your left / right heel into the surface as if you are trying to pull it toward your buttocks. Tighten the muscles in the back of your thighs to dig as hard as you can without increasing any pain. 4. Hold this position for __________ seconds. 5. Release the tension gradually and allow your muscles to relax completely for __________ seconds after each repetition. Repeat __________ times. Complete this exercise __________ times a day. Exercise H: Hamstring Curls  If told by your health care provider, do this exercise while wearing ankle weights. Begin with __________ weights. Then increase the weight by 1 lb (0.5 kg) increments. Do not wear ankle weights that are more than __________. 1. Lie on your abdomen with your legs straight. 2. Bend your left / right knee as far as you can without feeling pain. Keep your hips flat against  the floor. 3. Hold this position for __________ seconds. 4. Slowly lower your leg to the starting position.  Repeat __________ times. Complete this exercise __________ times a day. Exercise I: Squats (Quadriceps) 1. Stand in front of a table, with your feet and knees pointing straight ahead. You may rest your hands on the table for balance but not for support. 2. Slowly bend your knees and lower your hips like you are going to sit in a chair. ? Keep your weight over your heels, not over your toes. ? Keep your lower legs upright so they are parallel with the table legs. ? Do not let your hips go lower than your knees. ? Do not bend lower than told by your health care provider. ? If your knee pain increases, do not bend as low. 3. Hold the squat position for __________ seconds. 4. Slowly push with your  legs to return to standing. Do not use your hands to pull yourself to standing. Repeat __________ times. Complete this exercise __________ times a day. Exercise J: Wall Slides (Quadriceps)  1. Lean your back against a smooth wall or door while you walk your feet out 18-24 inches (46-61 cm) from it. 2. Place your feet hip-width apart. 3. Slowly slide down the wall or door until your knees bend __________ degrees. Keep your knees over your heels, not over your toes. Keep your knees in line with your hips. 4. Hold for __________ seconds. Repeat __________ times. Complete this exercise __________ times a day. Exercise K: Straight Leg Raises - Hip Abductors 1. Lie on your side with your left / right leg in the top position. Lie so your head, shoulder, knee, and hip line up. You may bend your bottom knee to help you keep your balance. 2. Roll your hips slightly forward so your hips are stacked directly over each other and your left / right knee is facing forward. 3. Leading with your heel, lift your top leg 4-6 inches (10-15 cm). You should feel the muscles in your outer hip lifting. ? Do not let  your foot drift forward. ? Do not let your knee roll toward the ceiling. 4. Hold this position for __________ seconds. 5. Slowly return your leg to the starting position. 6. Let your muscles relax completely after each repetition. Repeat __________ times. Complete this exercise __________ times a day. Exercise L: Straight Leg Raises - Hip Extensors 1. Lie on your abdomen on a firm surface. You can put a pillow under your hips if that is more comfortable. 2. Tense the muscles in your buttocks and lift your left / right leg about 4-6 inches (10-15 cm). Keep your knee straight as you lift your leg. 3. Hold this position for __________ seconds. 4. Slowly lower your leg to the starting position. 5. Let your leg relax completely after each repetition. Repeat __________ times. Complete this exercise __________ times a day. This information is not intended to replace advice given to you by your health care provider. Make sure you discuss any questions you have with your health care provider. Document Released: 04/21/2005 Document Revised: 03/01/2016 Document Reviewed: 04/13/2015 Elsevier Interactive Patient Education  2018 [employer]  Hip Exercises Ask your health care provider which exercises are safe for you. Do exercises exactly as told by your health care provider and adjust them as directed. It is normal to feel mild stretching, pulling, tightness, or discomfort as you do these exercises, but you should stop right away if you feel sudden pain or your pain gets worse.Do not begin these exercises until told by your health care provider. STRETCHING AND RANGE OF MOTION EXERCISES These exercises warm up your muscles and joints and improve the movement and flexibility of your hip. These exercises also help to relieve pain, numbness, and tingling. Exercise A: Hamstrings, Supine  1. Lie on your back. 2. Loop a belt or towel over the ball of your left / rightfoot. The ball of your foot is on the  walking surface, right under your toes. 3. Straighten your left / rightknee and slowly pull on the belt to raise your leg. ? Do not let your left / right knee bend while you do this. ? Keep your other leg flat on the floor. ? Raise the left / right leg until you feel a gentle stretch behind your left / right knee or thigh. 4. Hold this position for  __________ seconds. 5. Slowly return your leg to the starting position. Repeat __________ times. Complete this stretch __________ times a day. Exercise B: Hip Rotators  1. Lie on your back on a firm surface. 2. Hold your left / right knee with your left / right hand. Hold your ankle with your other hand. 3. Gently pull your left / right knee and rotate your lower leg toward your other shoulder. ? Pull until you feel a stretch in your buttocks. ? Keep your hips and shoulders firmly planted while you do this stretch. 4. Hold this position for __________ seconds. Repeat __________ times. Complete this stretch __________ times a day. Exercise C: V-Sit (Hamstrings and Adductors)  1. Sit on the floor with your legs extended in a large "V" shape. Keep your knees straight during this exercise. 2. Start with your head and chest upright, then bend at your waist to reach for your left foot (position A). You should feel a stretch in your right inner thigh. 3. Hold this position for __________ seconds. Then slowly return to the upright position. 4. Bend at your waist to reach forward (position B). You should feel a stretch behind both of your thighs and knees. 5. Hold this position for __________ seconds. Then slowly return to the upright position. 6. Bend at your waist to reach for your right foot (position C). You should feel a stretch in your left inner thigh. 7. Hold this position for __________ seconds. Then slowly return to the upright position. Repeat __________ times. Complete this stretch __________ times a day. Exercise D: Lunge (Hip  Flexors)  1. Place your left / right knee on the floor and bend your other knee so that is directly over your ankle. You should be half-kneeling. 2. Keep good posture with your head over your shoulders. 3. Tighten your buttocks to point your tailbone downward. This helps your back to keep from arching too much. 4. You should feel a gentle stretch in the front of your left / right thigh and hip. If you do not feel any resistance, slightly slide your other foot forward and then slowly lunge forward so your knee once again lines up over your ankle. 5. Make sure your tailbone continues to point downward. 6. Hold this position for __________ seconds. Repeat __________ times. Complete this stretch __________ times a day. STRENGTHENING EXERCISES These exercises build strength and endurance in your hip. Endurance is the ability to use your muscles for a long time, even after they get tired. Exercise E: Bridge (Hip Extensors)  1. Lie on your back on a firm surface with your knees bent and your feet flat on the floor. 2. Tighten your buttocks muscles and lift your bottom off the floor until the trunk of your body is level with your thighs. ? Do not arch your back. ? You should feel the muscles working in your buttocks and the back of your thighs. If you do not feel these muscles, slide your feet 1-2 inches (2.5-5 cm) farther away from your buttocks. 3. Hold this position for __________ seconds. 4. Slowly lower your hips to the starting position. 5. Let your muscles relax completely between repetitions. 6. If this exercise is too easy, try doing it with your arms crossed over your chest. Repeat __________ times. Complete this exercise __________ times a day. Exercise F: Straight Leg Raises - Hip Abductors  1. Lie on your side with your left / right leg in the top position. Lie so your head, shoulder,  knee, and hip line up with each other. You may bend your bottom knee to help you balance. 2. Roll your  hips slightly forward, so your hips are stacked directly over each other and your left / right knee is facing forward. 3. Leading with your heel, lift your top leg 4-6 inches (10-15 cm). You should feel the muscles in your outer hip lifting. ? Do not let your foot drift forward. ? Do not let your knee roll toward the ceiling. 4. Hold this position for __________ seconds. 5. Slowly return to the starting position. 6. Let your muscles relax completely between repetitions. Repeat __________ times. Complete this exercise __________ times a day. Exercise G: Straight Leg Raises - Hip Adductors  1. Lie on your side with your left / right leg in the bottom position. Lie so your head, shoulder, knee, and hip line up. You may place your upper foot in front to help you balance. 2. Roll your hips slightly forward, so your hips are stacked directly over each other and your left / right knee is facing forward. 3. Tense the muscles in your inner thigh and lift your bottom leg 4-6 inches (10-15 cm). 4. Hold this position for __________ seconds. 5. Slowly return to the starting position. 6. Let your muscles relax completely between repetitions. Repeat __________ times. Complete this exercise __________ times a day. Exercise H: Straight Leg Raises - Quadriceps  1. Lie on your back with your left / right leg extended and your other knee bent. 2. Tense the muscles in the front of your left / right thigh. When you do this, you should see your kneecap slide up or see increased dimpling just above your knee. 3. Tighten these muscles even more and raise your leg 4-6 inches (10-15 cm) off the floor. 4. Hold this position for __________ seconds. 5. Keep these muscles tense as you lower your leg. 6. Relax the muscles slowly and completely between repetitions. Repeat __________ times. Complete this exercise __________ times a day. Exercise I: Hip Abductors, Standing 1. Tie one end of a rubber exercise band or tubing  to a secure surface, such as a table or pole. 2. Loop the other end of the band or tubing around your left / right ankle. 3. Keeping your ankle with the band or tubing directly opposite of the secured end, step away until there is tension in the tubing or band. Hold onto a chair as needed for balance. 4. Lift your left / right leg out to your side. While you do this: ? Keep your back upright. ? Keep your shoulders over your hips. ? Keep your toes pointing forward. ? Make sure to use your hip muscles to lift your leg. Do not "throw" your leg or tip your body to lift your leg. 5. Hold this position for __________ seconds. 6. Slowly return to the starting position. Repeat __________ times. Complete this exercise __________ times a day. Exercise J: Squats (Quadriceps) 1. Stand in a door frame so your feet and knees are in line with the frame. You may place your hands on the frame for balance. 2. Slowly bend your knees and lower your hips like you are going to sit in a chair. ? Keep your lower legs in a straight-up-and-down position. ? Do not let your hips go lower than your knees. ? Do not bend your knees lower than told by your health care provider. ? If your hip pain increases, do not bend as low. 3. Hold  this position for ___________ seconds. 4. Slowly push with your legs to return to standing. Do not use your hands to pull yourself to standing. Repeat __________ times. Complete this exercise __________ times a day. This information is not intended to replace advice given to you by your health care provider. Make sure you discuss any questions you have with your health care provider. Document Released: 06/25/2005 Document Revised: 03/01/2016 Document Reviewed: 06/02/2015 Elsevier Interactive Patient Education  [employer].

## 2017-01-11 ENCOUNTER — Other Ambulatory Visit: Payer: Self-pay | Admitting: Rheumatology

## 2017-01-11 ENCOUNTER — Ambulatory Visit: Payer: Medicare Other | Admitting: Oncology

## 2017-01-11 ENCOUNTER — Telehealth: Payer: Self-pay | Admitting: Obstetrics & Gynecology

## 2017-01-11 NOTE — Telephone Encounter (Signed)
Left message regarding upcoming appointment has been canceled and needs to be rescheduled. °

## 2017-01-11 NOTE — Telephone Encounter (Signed)
Last Visit: 01/06/17  Next visit: 06/08/17 Labs: 01/05/17 stable  Okay to refill per Dr.Deveshwar

## 2017-01-12 ENCOUNTER — Ambulatory Visit: Payer: Medicare Other | Admitting: Oncology

## 2017-01-12 ENCOUNTER — Telehealth: Payer: Self-pay

## 2017-01-12 NOTE — Telephone Encounter (Addendum)
Received a fax from [employer] stating that they received the prescription for Enbrel for the patient but the patient must submit an application to the foundation in order to receive the medication.  Spoke to patient who states that she just got off the phone with them and they are going to send her the application via mail. Patient is going out of town tomorrow and will not return until 01/21/17. She will fill out the application and bring it to the clinic on 01/24/17 to be faxed for review. Patient has a one month supply of medication at home. She voices understanding and denies any questions at this time.  A prior authorization has been submitted to patients insurance via cover my meds.   Will fax the application once received by patient  and update once we receive a response.   Sariyah Corcino, [place], CPhT

## 2017-01-12 NOTE — Telephone Encounter (Signed)
Received a call from [facility]. Spoke with Helmut Muster who needed additional information to process the request. Information was provided.  We should have a response faxed to our clinic before the end of the day.   Will update once we receive a response.   Mouna Yager, [place], CPhT 2:26 PM

## 2017-01-13 ENCOUNTER — Telehealth: Payer: Self-pay | Admitting: Rheumatology

## 2017-01-13 NOTE — Telephone Encounter (Addendum)
Received a confirmation from [employer] regarding an approval for Enbrel from 01/12/17 through 01/12/2018.   Will send approval document to scan center once we receive it.   Jobie Popp, [place], CPhT 2:05 PM

## 2017-01-13 NOTE — Telephone Encounter (Signed)
[employer] has approved Patients [employer]. Authorization good for 1 year, starting 01/12/17. A confirmation letter will be sent, and patient has been informed.

## 2017-01-24 ENCOUNTER — Telehealth: Payer: Self-pay

## 2017-01-24 NOTE — Telephone Encounter (Signed)
Patient came by office to provide her application and consent form for her Enbrel patient assistance. The application has been faxed and we will update once we receive a response. Patient voices understanding and denies any questions during this time.   Prapti Grussing, [place], CPhT 11:43 AM

## 2017-01-27 NOTE — Telephone Encounter (Signed)
Received a fax from [employer] stating that patient has been denied because it appears that she qualifies for "extra help" through social security. After reviewing the qualifications for extra help and her yearly income, she would not qualify.   Called patient to verify. She states that the amount documented on her application is her weekly income. Will call [employer] to clarify.   Called [employer] to verify. Spoke to [place] who reviewed her application. It appears that her documented income was viewed as her weekly amount and not monthly. The application was reprocessed.  Receive a fax from [employer] stating that the patient has been APPROVED to receive Enbrel free of charge from 01/26/17 through 06/20/2017.   Case ID: [id number] Phone ID: [phone]  Called patient to update her. She states that she is getting low on her medication. Gave her the number to [employer] to call to set up her shipment. She voiced understanding and denied any questions at this time.  Recardo Linn, [place], CPhT 8:37 AM

## 2017-01-31 ENCOUNTER — Ambulatory Visit (HOSPITAL_BASED_OUTPATIENT_CLINIC_OR_DEPARTMENT_OTHER): Payer: Medicare Other | Admitting: Oncology

## 2017-01-31 VITALS — BP 122/53 | HR 83 | Temp 98.6°F | Resp 18 | Ht 64.0 in | Wt 129.9 lb

## 2017-01-31 DIAGNOSIS — Z9189 Other specified personal risk factors, not elsewhere classified: Secondary | ICD-10-CM

## 2017-01-31 DIAGNOSIS — D721 Eosinophilia, unspecified: Secondary | ICD-10-CM | POA: Insufficient documentation

## 2017-01-31 DIAGNOSIS — L570 Actinic keratosis: Secondary | ICD-10-CM | POA: Diagnosis not present

## 2017-01-31 DIAGNOSIS — M069 Rheumatoid arthritis, unspecified: Secondary | ICD-10-CM | POA: Diagnosis not present

## 2017-01-31 DIAGNOSIS — Z1379 Encounter for other screening for genetic and chromosomal anomalies: Secondary | ICD-10-CM

## 2017-01-31 DIAGNOSIS — R922 Inconclusive mammogram: Secondary | ICD-10-CM

## 2017-01-31 DIAGNOSIS — Z1231 Encounter for screening mammogram for malignant neoplasm of breast: Secondary | ICD-10-CM

## 2017-01-31 DIAGNOSIS — M0579 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 without organ or systems involvement: Secondary | ICD-10-CM

## 2017-01-31 DIAGNOSIS — M3500 Sicca syndrome, unspecified: Secondary | ICD-10-CM | POA: Diagnosis not present

## 2017-01-31 NOTE — Progress Notes (Signed)
[place]  [phone] [phone]     ID: MORAYO LEVEN DOB: 03-Mar-1956  MR#: [id number]  FBP#:[id number]  Patient Care Team: Reynold Bowen, MD as PCP - General (Endocrinology) Yisroel Ramming, Everardo All, MD as Consulting Physician (Obstetrics and Gynecology) Copper Basnett, Virgie Dad, MD as Consulting Physician (Oncology) Pieter Partridge, DO as Consulting Physician (Neurology) Yisroel Ramming, Everardo All, MD as Consulting Physician (Obstetrics and Gynecology) PCP: Reynold Bowen, MD SU:  OTHER MD:  CHIEF COMPLAINT: breast cancer high risk  CURRENT TREATMENT: intensified screening   INTERVAL HISTORY: Letonya returns today for follow-up of her high risk breast cancer situation. She is under intensified screening. On 04/05/2016 she underwent bilateral breast MRI showing the breast composition to be category D, extremely dense. There was no evidence of malignancy.  On 11/16/2016 she had mammography at [facility] showing the breast density to be category D. There was no evidence of malignancy.  In addition, for evaluation of lateral chest pain she had a CT angiogram 09/13/2016 which showed no evidence of pulmonary embolism, no infiltrates, no bony findings and essentially a normal study.  Lab work obtained in July also shows resolution of the earlier eosinophilia, which was likely reactive, either to medication or to her rheumatologic problems  REVIEW OF SYSTEMS: She used Efudex cream in her shins bilaterally through a dermatologist in [facility] This "took care of" multiple precancerous lesions but her legs were full of scabs for a while. They are now pretty much back to normal. She was started on pearly by Dr. Purcell Nails and is tolerating that well. Aside from these issues a detailed review of systems today was stable  HISTORY OF PRESENT ILLNESS: From the initial intake note:   Ms Hett was separately referred to the [facility] by Dr Forde Dandy for evaluation of  eosinophilia and by Dr Quincy Simmonds for genetics counseling. It is best to discuss these unrelated problem separately:  Eosinophilia: We have a CBC with differential dating back to 01/26/2012. This showed WBC 6.1, Hb 11.9 and platelets 248. Absolute eosinophil count was 1.0. At that time the patient was worked up for atypical chest [pain, with an equivocal Myoview. She underwent catheterization 01/28/2012 which showed normal coronaries. Chest X-ray 01/26/2012 showed no infiltrates. There are multiple other CBC reports, all normal, but w/o differentials. We just repeated a CBC/diff here, which shows WBC 5.7, Hb 13.3, platelets 265, and abs eosiniphil count  1.2 (normal to 0.5 ).  The patient has a significant rheumatoid history including rheumatoid arthritis and Sjogren's syndrome, followed by Dr Tomi Likens. She tells me the arthritis is well controlled. Currently she denies rash, difficulty swallowing or painful swallowing, gastritis or history of ulcers; denies cough, phlegm production, pleurisy, or history of repeated URIs or PNA; or changes in bowel habits. She underwent biopsy of an anterior neck mass 10/13/2009 which showed only reactive lymphoid hyperplasia([id number]). She denies travel outside the country, fevers, unexplained weight loss of unexplained fatigue.  High Risk for Breast Cancer: The patient's mother, currently 71, was diagnosed with breast cancer at age 74. She also has a history of colon polyps. The patient's father is 89, with no history of cancer. The patient's only full sister, was diagnosed with breast cancer at age 35. She had a negative test through the breast or ovarian cancer panel. Further on the mother's side there is one cousin with "female cancer" diagnosed in her 70s, and another with cervical cancer in the 37s. The patient's maternal grandmother was  diagnosed with breast cancer at age 11. That grandmother had 3 sisters 2 of whom were diagnosed with breast cancers in their 43s. (The  third sister died in her 108s from surgical complications not related to cancer).-- In addition, the patient has breast density category C/D. Her breasts are lumpy making screening exam additionally difficult.  Biopsy of several right breast masses 10/09/1998 showed no evidence of malignancy ([id number])  Her subsequent history is as detailed below.  PAST MEDICAL HISTORY: Past Medical History:  Diagnosis Date  . Abnormal ECG   . Anemia   . Anxiety   . Cataract    . Cateracts bil eyes removed  . GERD (gastroesophageal reflux disease)   . Heart murmur   . Hypercholesterolemia   . IDDM (insulin dependent diabetes mellitus) ([place]) 1969  . Migraine headache   . Neuromuscular disorder (HCC)    bil legs & feet - neuropathy  . Neuropathy   . Osteoporosis    10/2012  . Otosclerosis   . RA (rheumatoid arthritis) ([place]) 2000  . S/P cardiac cath August 2013   Normal coronaries and normal LV function.  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 BREAST LUMPECTOMY  1995  . CARPAL TUNNEL RELEASE  2000  . CATARACT EXTRACTION Right 09-05-12  . CATARACT EXTRACTION Left 3/15  . CESAREAN SECTION  1987  . COLONOSCOPY    . EYE SURGERY     diabetic retinopathy   . HYSTEROSCOPY  02/2001   and D & C  . LYMPH NODE DISSECTION  2002  . TOE SURGERY  2000   bone removed    FAMILY HISTORY Family History  Problem Relation Age of Onset  . Breast cancer Mother 73  . Colon polyps Mother        unspecified number  . Breast cancer Maternal Grandmother 65  . Stroke Father 66  . Rheum arthritis Sister   . Breast cancer Sister 48       lump; (-)GT (breast/ova ca panel, [facility])  . Skin cancer Maternal Grandfather        unspecified type  . Prostate cancer Maternal Grandfather        dx. late 37s  . Breast cancer Other        dx. in their late 54s  . Breast cancer Other        dx. 78-79  . Breast cancer Cousin        bilateral; dx.  late 74s  . Colon cancer Neg Hx   . Esophageal cancer Neg Hx   . Rectal cancer Neg Hx   . Stomach cancer Neg Hx    (see also history of present illness above).   GYNECOLOGIC HISTORY:  Patient's last menstrual period was 09/19/2008.  Menarche age 15, first live birth age 43, she is GX P1, with the change of life occurring at age 31. She did not take hormone replacement. 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 Her husband Luciana Axe works as a Games developer. More specifically he does interior trim for [employer], so he does a lot of traveling. The patient's daughter is a Programme researcher, broadcasting/film/video and works in [place]. She got married in September 2016. The patient is not a church attender.     HEALTH MAINTENANCE: Social History  Substance Use Topics  . Smoking status: Never Smoker  .  Smokeless tobacco: Never Used  . Alcohol use 2.4 oz/week    2 Shots of liquor, 2 Standard drinks or equivalent per week     Comment: maybe 1-2 cocktails per wk     Colonoscopy: 2012/ [facility]  PAP:  Bone density:   On forteo  Lipid panel:  Allergies  Allergen Reactions  . Ciprofloxacin Rash  . Penicillins Rash    Current Outpatient Prescriptions  Medication Sig Dispense Refill  . denosumab (PROLIA) 60 MG/ML SOLN injection Inject 60 mg into the skin every 6 (six) months. Administer in upper arm, thigh, or abdomen    . aspirin 81 MG tablet Take 81 mg by mouth daily.    Marland Kitchen BLACK CURRANT SEED OIL PO Take by mouth.    . calcium carbonate (OS-CAL) 600 MG TABS Take 600 mg by mouth 2 (two) times daily with a meal.     . cyclobenzaprine (FLEXERIL) 10 MG tablet Take 10 mg by mouth 3 (three) times daily as needed for muscle spasms. Reported on 07/03/2015    . DULoxetine (CYMBALTA) 60 MG capsule Take 60 mg by mouth daily.    Marland Kitchen etanercept (ENBREL SURECLICK) 50 MG/ML injection Inject 0.98 mLs (50 mg total) into the  skin once a week. 69.67 mL 0  . folic acid (FOLVITE) 1 MG tablet Take 2 tablets (2 mg total) by mouth daily. 180 tablet 3  . furosemide (LASIX) 20 MG tablet Take 20 mg by mouth every other day.    . insulin aspart (NOVOLOG) 100 UNIT/ML injection Insulin pump    . losartan (COZAAR) 25 MG tablet Take 50 mg by mouth daily.     . Magnesium 250 MG TABS Take by mouth.    . methotrexate 50 MG/2ML injection INJECT 0.8 MILLILITERS INTO THE SKIN ONCE WEEKLY 10 mL 0  . Misc Natural Products (TART CHERRY ADVANCED PO) Take by mouth.    . Misc Natural Products (TURMERIC CURCUMIN) CAPS Take by mouth.    . Multiple Vitamin (MULTIVITAMIN) tablet Take 1 tablet by mouth daily.    Marland Kitchen POLYETHYLENE GLYCOL 3350 PO Take by mouth 2 (two) times daily.     . pravastatin (PRAVACHOL) 20 MG tablet Take 40 mg by mouth daily.     . SUMAtriptan (IMITREX) 100 MG tablet Take 100 mg by mouth every 2 (two) hours as needed.    . topiramate (TOPAMAX) 200 MG tablet Take 200 mg by mouth 2 (two) times daily.    . Tuberculin-Allergy Syringes 27G X 1/2" 1 ML KIT Inject 1 Syringe into the skin once a week. To be used with weekly methotrexate injections. 12 each 3  . vitamin B-12 (CYANOCOBALAMIN) 1000 MCG tablet Take 1,000 mcg by mouth daily.     No current facility-administered medications for this visit.     OBJECTIVE: Middle-aged white woman Who appears stated age  33:   01/31/17 1005  BP: (!) 122/53  Pulse: 83  Resp: 18  Temp: 98.6 F (37 C)  SpO2: 100%     Body mass index is 22.3 kg/m.    ECOG FS:1 - Symptomatic but completely ambulatory   LAB RESULTS: Sclerae unicteric, pupils round and equal Oropharynx clear and moist No cervical or supraclavicular adenopathy Lungs no rales or rhonchi Heart regular rate and rhythm Abd soft, nontender, positive bowel sounds MSK no focal spinal tenderness, no upper extremity lymphedema Neuro: nonfocal, well oriented, appropriate affect Breasts: The right breast is status post  multiple prior biopsies. Both breasts are quite lumpy. Exam  is not informative as a result. Both axillae are benign.  CMP     Component Value Date/Time   NA 142 01/05/2017 1257   K 4.3 01/05/2017 1257   CL 106 09/13/2016 1734   CO2 24 01/05/2017 1257   GLUCOSE 95 01/05/2017 1257   BUN 11.4 01/05/2017 1257   CREATININE 1.0 01/05/2017 1257   CALCIUM 9.7 01/05/2017 1257   PROT 7.1 01/05/2017 1257   ALBUMIN 3.9 01/05/2017 1257   AST 24 01/05/2017 1257   ALT 18 01/05/2017 1257   ALKPHOS 67 01/05/2017 1257   BILITOT 0.26 01/05/2017 1257   GFRNONAA 57 (L) 09/13/2016 1734   GFRNONAA 58 (L) 08/17/2016 1141   GFRAA >60 09/13/2016 1734   GFRAA 66 08/17/2016 1141    INo results found for: SPEP, UPEP  Lab Results  Component Value Date   WBC 5.2 01/05/2017   NEUTROABS 2.5 01/05/2017   HGB 13.6 01/05/2017   HCT 41.9 01/05/2017   MCV 94.0 01/05/2017   PLT 262 01/05/2017      Chemistry      Component Value Date/Time   NA 142 01/05/2017 1257   K 4.3 01/05/2017 1257   CL 106 09/13/2016 1734   CO2 24 01/05/2017 1257   BUN 11.4 01/05/2017 1257   CREATININE 1.0 01/05/2017 1257      Component Value Date/Time   CALCIUM 9.7 01/05/2017 1257   ALKPHOS 67 01/05/2017 1257   AST 24 01/05/2017 1257   ALT 18 01/05/2017 1257   BILITOT 0.26 01/05/2017 1257       No results found for: LABCA2  No components found for: LABCA125  No results for input(s): INR in the last 168 hours.  Urinalysis    Component Value Date/Time   BILIRUBINUR n 08/02/2013 1542   PROTEINUR n 08/02/2013 1542   UROBILINOGEN negative 08/02/2013 1542   NITRITE n 08/02/2013 1542   LEUKOCYTESUR Negative 08/02/2013 1542    STUDIES: Mammography with tomography at [facility] 11/16/2016 found a breast density to be category D. There was no evidence of malignancy.  ASSESSMENT: 62 y.o. [place], [place] woman at high risk for breast cancer, with a history of eosinophilia in the setting of rheumatoid arthritis and Sjogren's  syndrome  (1) genetics testing on 04/15/15 through the [place] Panel through [facility] found no deleterious mutations in M, BARD1, BRCA1, BRCA2, BRIP1, CDH1, CHEK2, FANCC, MLH1, MSH2, MSH6, NBN, PALB2, PMS2, PTEN, RAD51C, RAD51D, TP53, and XRCC2. This panel also includes deletion/duplication analysis (without sequencing) for EPCAM.  (2)  benign eosinophilia likely associated with rheumatoid arthritis or secondary to medication: resolved  (3) breast cancer high risk:  (a) intolerant of tamoxifen (constipation)  (b) screening intensitifcation with yearly breast MRI starting Oct 2017    PLAN: Alfred continues on intensified screening. She had her first MRI last October, and then her routine bilateral mammography with tomography in June. She will be due for her next breast MRI in December 2018.  He again discussed the fact that if she could tolerate anti-estrogens or breast density might decrease sufficiently that breast MRIs might not be mandatory. Given her other medical problems however she does not feel she can handle the possible side effects of those agents.  We also reviewed the fact that in her most recent lab work the eosinophil absolute count was normal.  In some she will have breast MRI in December 2018, breast mammography at [facility] June 2019, and she will see me again in August of next year  She knows to call for any other issues that may develop before the next visit here.   Chauncey Cruel, MD   01/31/2017 10:19 AM Medical Oncology and Hematology [facility] [address] [place], [place] [address] Tel. [phone]    Fax. [phone]

## 2017-02-02 DIAGNOSIS — Z794 Long term (current) use of insulin: Secondary | ICD-10-CM | POA: Diagnosis not present

## 2017-02-02 DIAGNOSIS — E109 Type 1 diabetes mellitus without complications: Secondary | ICD-10-CM | POA: Diagnosis not present

## 2017-02-17 DIAGNOSIS — G518 Other disorders of facial nerve: Secondary | ICD-10-CM | POA: Diagnosis not present

## 2017-02-17 DIAGNOSIS — M542 Cervicalgia: Secondary | ICD-10-CM | POA: Diagnosis not present

## 2017-02-17 DIAGNOSIS — G43719 Chronic migraine without aura, intractable, without status migrainosus: Secondary | ICD-10-CM | POA: Diagnosis not present

## 2017-02-17 DIAGNOSIS — M791 Myalgia: Secondary | ICD-10-CM | POA: Diagnosis not present

## 2017-02-18 DIAGNOSIS — M9902 Segmental and somatic dysfunction of thoracic region: Secondary | ICD-10-CM | POA: Diagnosis not present

## 2017-02-18 DIAGNOSIS — M9901 Segmental and somatic dysfunction of cervical region: Secondary | ICD-10-CM | POA: Diagnosis not present

## 2017-02-18 DIAGNOSIS — M542 Cervicalgia: Secondary | ICD-10-CM | POA: Diagnosis not present

## 2017-02-18 DIAGNOSIS — M546 Pain in thoracic spine: Secondary | ICD-10-CM | POA: Diagnosis not present

## 2017-02-22 DIAGNOSIS — M546 Pain in thoracic spine: Secondary | ICD-10-CM | POA: Diagnosis not present

## 2017-02-22 DIAGNOSIS — M542 Cervicalgia: Secondary | ICD-10-CM | POA: Diagnosis not present

## 2017-02-22 DIAGNOSIS — M9901 Segmental and somatic dysfunction of cervical region: Secondary | ICD-10-CM | POA: Diagnosis not present

## 2017-02-22 DIAGNOSIS — M9902 Segmental and somatic dysfunction of thoracic region: Secondary | ICD-10-CM | POA: Diagnosis not present

## 2017-02-23 DIAGNOSIS — M542 Cervicalgia: Secondary | ICD-10-CM | POA: Diagnosis not present

## 2017-02-23 DIAGNOSIS — M9902 Segmental and somatic dysfunction of thoracic region: Secondary | ICD-10-CM | POA: Diagnosis not present

## 2017-02-23 DIAGNOSIS — M546 Pain in thoracic spine: Secondary | ICD-10-CM | POA: Diagnosis not present

## 2017-02-23 DIAGNOSIS — M9901 Segmental and somatic dysfunction of cervical region: Secondary | ICD-10-CM | POA: Diagnosis not present

## 2017-02-24 DIAGNOSIS — M542 Cervicalgia: Secondary | ICD-10-CM | POA: Diagnosis not present

## 2017-02-24 DIAGNOSIS — M546 Pain in thoracic spine: Secondary | ICD-10-CM | POA: Diagnosis not present

## 2017-02-24 DIAGNOSIS — M9901 Segmental and somatic dysfunction of cervical region: Secondary | ICD-10-CM | POA: Diagnosis not present

## 2017-02-24 DIAGNOSIS — M9902 Segmental and somatic dysfunction of thoracic region: Secondary | ICD-10-CM | POA: Diagnosis not present

## 2017-03-01 DIAGNOSIS — M542 Cervicalgia: Secondary | ICD-10-CM | POA: Diagnosis not present

## 2017-03-01 DIAGNOSIS — M546 Pain in thoracic spine: Secondary | ICD-10-CM | POA: Diagnosis not present

## 2017-03-01 DIAGNOSIS — M9901 Segmental and somatic dysfunction of cervical region: Secondary | ICD-10-CM | POA: Diagnosis not present

## 2017-03-01 DIAGNOSIS — M9902 Segmental and somatic dysfunction of thoracic region: Secondary | ICD-10-CM | POA: Diagnosis not present

## 2017-03-02 DIAGNOSIS — M9902 Segmental and somatic dysfunction of thoracic region: Secondary | ICD-10-CM | POA: Diagnosis not present

## 2017-03-02 DIAGNOSIS — M546 Pain in thoracic spine: Secondary | ICD-10-CM | POA: Diagnosis not present

## 2017-03-02 DIAGNOSIS — M542 Cervicalgia: Secondary | ICD-10-CM | POA: Diagnosis not present

## 2017-03-02 DIAGNOSIS — M9901 Segmental and somatic dysfunction of cervical region: Secondary | ICD-10-CM | POA: Diagnosis not present

## 2017-03-03 DIAGNOSIS — M9901 Segmental and somatic dysfunction of cervical region: Secondary | ICD-10-CM | POA: Diagnosis not present

## 2017-03-03 DIAGNOSIS — M542 Cervicalgia: Secondary | ICD-10-CM | POA: Diagnosis not present

## 2017-03-03 DIAGNOSIS — M546 Pain in thoracic spine: Secondary | ICD-10-CM | POA: Diagnosis not present

## 2017-03-03 DIAGNOSIS — M9902 Segmental and somatic dysfunction of thoracic region: Secondary | ICD-10-CM | POA: Diagnosis not present

## 2017-03-07 DIAGNOSIS — E1142 Type 2 diabetes mellitus with diabetic polyneuropathy: Secondary | ICD-10-CM | POA: Diagnosis not present

## 2017-03-07 DIAGNOSIS — Z803 Family history of malignant neoplasm of breast: Secondary | ICD-10-CM | POA: Diagnosis not present

## 2017-03-07 DIAGNOSIS — H3520 Other non-diabetic proliferative retinopathy, unspecified eye: Secondary | ICD-10-CM | POA: Diagnosis not present

## 2017-03-07 DIAGNOSIS — E1040 Type 1 diabetes mellitus with diabetic neuropathy, unspecified: Secondary | ICD-10-CM | POA: Diagnosis not present

## 2017-03-08 DIAGNOSIS — M546 Pain in thoracic spine: Secondary | ICD-10-CM | POA: Diagnosis not present

## 2017-03-08 DIAGNOSIS — M542 Cervicalgia: Secondary | ICD-10-CM | POA: Diagnosis not present

## 2017-03-08 DIAGNOSIS — M9902 Segmental and somatic dysfunction of thoracic region: Secondary | ICD-10-CM | POA: Diagnosis not present

## 2017-03-08 DIAGNOSIS — M9901 Segmental and somatic dysfunction of cervical region: Secondary | ICD-10-CM | POA: Diagnosis not present

## 2017-03-09 DIAGNOSIS — M9901 Segmental and somatic dysfunction of cervical region: Secondary | ICD-10-CM | POA: Diagnosis not present

## 2017-03-09 DIAGNOSIS — M546 Pain in thoracic spine: Secondary | ICD-10-CM | POA: Diagnosis not present

## 2017-03-09 DIAGNOSIS — M542 Cervicalgia: Secondary | ICD-10-CM | POA: Diagnosis not present

## 2017-03-09 DIAGNOSIS — M9902 Segmental and somatic dysfunction of thoracic region: Secondary | ICD-10-CM | POA: Diagnosis not present

## 2017-03-11 DIAGNOSIS — M542 Cervicalgia: Secondary | ICD-10-CM | POA: Diagnosis not present

## 2017-03-11 DIAGNOSIS — M9902 Segmental and somatic dysfunction of thoracic region: Secondary | ICD-10-CM | POA: Diagnosis not present

## 2017-03-11 DIAGNOSIS — M9901 Segmental and somatic dysfunction of cervical region: Secondary | ICD-10-CM | POA: Diagnosis not present

## 2017-03-11 DIAGNOSIS — M546 Pain in thoracic spine: Secondary | ICD-10-CM | POA: Diagnosis not present

## 2017-03-15 DIAGNOSIS — M542 Cervicalgia: Secondary | ICD-10-CM | POA: Diagnosis not present

## 2017-03-15 DIAGNOSIS — M9902 Segmental and somatic dysfunction of thoracic region: Secondary | ICD-10-CM | POA: Diagnosis not present

## 2017-03-15 DIAGNOSIS — M546 Pain in thoracic spine: Secondary | ICD-10-CM | POA: Diagnosis not present

## 2017-03-15 DIAGNOSIS — M9901 Segmental and somatic dysfunction of cervical region: Secondary | ICD-10-CM | POA: Diagnosis not present

## 2017-03-17 DIAGNOSIS — M542 Cervicalgia: Secondary | ICD-10-CM | POA: Diagnosis not present

## 2017-03-17 DIAGNOSIS — M9901 Segmental and somatic dysfunction of cervical region: Secondary | ICD-10-CM | POA: Diagnosis not present

## 2017-03-17 DIAGNOSIS — M546 Pain in thoracic spine: Secondary | ICD-10-CM | POA: Diagnosis not present

## 2017-03-17 DIAGNOSIS — M9902 Segmental and somatic dysfunction of thoracic region: Secondary | ICD-10-CM | POA: Diagnosis not present

## 2017-03-21 DIAGNOSIS — M9901 Segmental and somatic dysfunction of cervical region: Secondary | ICD-10-CM | POA: Diagnosis not present

## 2017-03-21 DIAGNOSIS — M542 Cervicalgia: Secondary | ICD-10-CM | POA: Diagnosis not present

## 2017-03-21 DIAGNOSIS — M546 Pain in thoracic spine: Secondary | ICD-10-CM | POA: Diagnosis not present

## 2017-03-21 DIAGNOSIS — M9902 Segmental and somatic dysfunction of thoracic region: Secondary | ICD-10-CM | POA: Diagnosis not present

## 2017-03-22 ENCOUNTER — Telehealth: Payer: Self-pay

## 2017-03-22 NOTE — Telephone Encounter (Signed)
Called [employer] foundation to check the process of the 2019 renewal application. Spoke to a representative who states that each patient will be required to submit a full application along with a prior authorization letter. Financial documents are optional, however, may be required later on. Application can be faxed starting on 03/22/2017 for processing. Anything before will be automatically denied. We can fax the application to the foundation for the patient in the clinic.   Called patient to inform. Did not get an answer. Left message.  Italo Banton, [place], CPhT 2:03 PM

## 2017-03-22 NOTE — Telephone Encounter (Signed)
Patient returned call. Patient will call within the next couple of weeks to come by and complete application for 2019. We will fax application once complete. Patient voices understanding and denies any questions at this time.   Shireen Rayburn, [place], CPhT 4:33 PM

## 2017-03-23 DIAGNOSIS — M9901 Segmental and somatic dysfunction of cervical region: Secondary | ICD-10-CM | POA: Diagnosis not present

## 2017-03-23 DIAGNOSIS — M546 Pain in thoracic spine: Secondary | ICD-10-CM | POA: Diagnosis not present

## 2017-03-23 DIAGNOSIS — M9902 Segmental and somatic dysfunction of thoracic region: Secondary | ICD-10-CM | POA: Diagnosis not present

## 2017-03-23 DIAGNOSIS — M542 Cervicalgia: Secondary | ICD-10-CM | POA: Diagnosis not present

## 2017-03-28 DIAGNOSIS — M542 Cervicalgia: Secondary | ICD-10-CM | POA: Diagnosis not present

## 2017-03-28 DIAGNOSIS — M9902 Segmental and somatic dysfunction of thoracic region: Secondary | ICD-10-CM | POA: Diagnosis not present

## 2017-03-28 DIAGNOSIS — M546 Pain in thoracic spine: Secondary | ICD-10-CM | POA: Diagnosis not present

## 2017-03-28 DIAGNOSIS — M9901 Segmental and somatic dysfunction of cervical region: Secondary | ICD-10-CM | POA: Diagnosis not present

## 2017-03-30 DIAGNOSIS — M546 Pain in thoracic spine: Secondary | ICD-10-CM | POA: Diagnosis not present

## 2017-03-30 DIAGNOSIS — M542 Cervicalgia: Secondary | ICD-10-CM | POA: Diagnosis not present

## 2017-03-30 DIAGNOSIS — M9902 Segmental and somatic dysfunction of thoracic region: Secondary | ICD-10-CM | POA: Diagnosis not present

## 2017-03-30 DIAGNOSIS — M9901 Segmental and somatic dysfunction of cervical region: Secondary | ICD-10-CM | POA: Diagnosis not present

## 2017-04-04 DIAGNOSIS — M9901 Segmental and somatic dysfunction of cervical region: Secondary | ICD-10-CM | POA: Diagnosis not present

## 2017-04-04 DIAGNOSIS — M9902 Segmental and somatic dysfunction of thoracic region: Secondary | ICD-10-CM | POA: Diagnosis not present

## 2017-04-04 DIAGNOSIS — M546 Pain in thoracic spine: Secondary | ICD-10-CM | POA: Diagnosis not present

## 2017-04-04 DIAGNOSIS — M542 Cervicalgia: Secondary | ICD-10-CM | POA: Diagnosis not present

## 2017-04-06 ENCOUNTER — Other Ambulatory Visit: Payer: Self-pay

## 2017-04-06 ENCOUNTER — Telehealth: Payer: Self-pay

## 2017-04-06 DIAGNOSIS — M791 Myalgia, unspecified site: Secondary | ICD-10-CM | POA: Diagnosis not present

## 2017-04-06 DIAGNOSIS — M542 Cervicalgia: Secondary | ICD-10-CM | POA: Diagnosis not present

## 2017-04-06 DIAGNOSIS — M9901 Segmental and somatic dysfunction of cervical region: Secondary | ICD-10-CM | POA: Diagnosis not present

## 2017-04-06 DIAGNOSIS — Z79899 Other long term (current) drug therapy: Secondary | ICD-10-CM | POA: Diagnosis not present

## 2017-04-06 DIAGNOSIS — M546 Pain in thoracic spine: Secondary | ICD-10-CM | POA: Diagnosis not present

## 2017-04-06 DIAGNOSIS — G518 Other disorders of facial nerve: Secondary | ICD-10-CM | POA: Diagnosis not present

## 2017-04-06 DIAGNOSIS — G43719 Chronic migraine without aura, intractable, without status migrainosus: Secondary | ICD-10-CM | POA: Diagnosis not present

## 2017-04-06 DIAGNOSIS — M9902 Segmental and somatic dysfunction of thoracic region: Secondary | ICD-10-CM | POA: Diagnosis not present

## 2017-04-06 NOTE — Progress Notes (Signed)
K is high . Could be hemolyzed sample. Please, call Pt to check if she is on K> Fax results to her PCP.

## 2017-04-06 NOTE — Telephone Encounter (Signed)
Patient came by clinic today to give copies of her bank statement to be submitted with her [facility]. Will submit application once provider portion is complete. Will update once we receive a response.   Phone: [phone] Fax: [phone]  Abran Duke, CPhT 11:54 AM

## 2017-04-08 LAB — COMPLETE METABOLIC PANEL WITH GFR
AG Ratio: 1.8 (calc) (ref 1.0–2.5)
ALT: 22 U/L (ref 6–29)
AST: 27 U/L (ref 10–35)
Albumin: 4.6 g/dL (ref 3.6–5.1)
Alkaline phosphatase (APISO): 39 U/L (ref 33–130)
BUN: 15 mg/dL (ref 7–25)
CO2: 29 mmol/L (ref 20–32)
Calcium: 10.4 mg/dL (ref 8.6–10.4)
Chloride: 108 mmol/L (ref 98–110)
Creat: 0.91 mg/dL (ref 0.50–0.99)
GFR, Est African American: 79 mL/min/{1.73_m2} (ref >=60)
GFR, Est Non African American: 69 mL/min/{1.73_m2} (ref >=60)
Globulin: 2.5 g/dL (calc) (ref 1.9–3.7)
Glucose, Bld: 140 mg/dL — ABNORMAL HIGH (ref 65–99)
Potassium: 5.8 mmol/L — ABNORMAL HIGH (ref 3.5–5.3)
Sodium: 144 mmol/L (ref 135–146)
Total Bilirubin: 0.6 mg/dL (ref 0.2–1.2)
Total Protein: 7.1 g/dL (ref 6.1–8.1)

## 2017-04-08 LAB — CBC WITH DIFFERENTIAL/PLATELET
Basophils Absolute: 78 cells/uL (ref 0–200)
Basophils Relative: 1.7 %
Eosinophils Absolute: 221 cells/uL (ref 15–500)
Eosinophils Relative: 4.8 %
HCT: 40.2 % (ref 35.0–45.0)
Hemoglobin: 13.5 g/dL (ref 11.7–15.5)
Lymphs Abs: 1431 cells/uL (ref 850–3900)
MCH: 30.7 pg (ref 27.0–33.0)
MCHC: 33.6 g/dL (ref 32.0–36.0)
MCV: 91.4 fL (ref 80.0–100.0)
MPV: 12.5 fL (ref 7.5–12.5)
Monocytes Relative: 8.2 %
Neutro Abs: 2493 cells/uL (ref 1500–7800)
Neutrophils Relative %: 54.2 %
Platelets: 268 10*3/uL (ref 140–400)
RBC: 4.4 10*6/uL (ref 3.80–5.10)
RDW: 14 % (ref 11.0–15.0)
Total Lymphocyte: 31.1 %
WBC mixed population: 377 cells/uL (ref 200–950)
WBC: 4.6 10*3/uL (ref 3.8–10.8)

## 2017-04-08 LAB — QUANTIFERON TB GOLD ASSAY (BLOOD)
Mitogen-Nil: 5.07 IU/mL
QUANTIFERON(R)-TB GOLD: NEGATIVE
Quantiferon Nil Value: 0.03 IU/mL
Quantiferon Tb Ag Minus Nil Value: 0.04 IU/mL

## 2017-04-11 ENCOUNTER — Telehealth: Payer: Self-pay | Admitting: Rheumatology

## 2017-04-11 DIAGNOSIS — M9901 Segmental and somatic dysfunction of cervical region: Secondary | ICD-10-CM | POA: Diagnosis not present

## 2017-04-11 DIAGNOSIS — M546 Pain in thoracic spine: Secondary | ICD-10-CM | POA: Diagnosis not present

## 2017-04-11 DIAGNOSIS — M9902 Segmental and somatic dysfunction of thoracic region: Secondary | ICD-10-CM | POA: Diagnosis not present

## 2017-04-11 DIAGNOSIS — M542 Cervicalgia: Secondary | ICD-10-CM | POA: Diagnosis not present

## 2017-04-11 NOTE — Telephone Encounter (Signed)
Patient called stating that her PCP has not received her lab work from [place].

## 2017-04-11 NOTE — Telephone Encounter (Signed)
Patient advised labs have been re-faxed to PCP.

## 2017-04-12 ENCOUNTER — Telehealth: Payer: Self-pay | Admitting: Rheumatology

## 2017-04-12 NOTE — Telephone Encounter (Signed)
Patient left a message 10/22 stating GP Dr. Rinaldo Cloud office has not received the lab results she was to follow up with them about. Please resend, and let patient know when sent so she can follow up with Dr. Rinaldo Cloud office.

## 2017-04-12 NOTE — Telephone Encounter (Signed)
Labs re-faxed on 04/11/17 @ 4:55pm. Patient aware.

## 2017-04-13 DIAGNOSIS — M9902 Segmental and somatic dysfunction of thoracic region: Secondary | ICD-10-CM | POA: Diagnosis not present

## 2017-04-13 DIAGNOSIS — M542 Cervicalgia: Secondary | ICD-10-CM | POA: Diagnosis not present

## 2017-04-13 DIAGNOSIS — M9901 Segmental and somatic dysfunction of cervical region: Secondary | ICD-10-CM | POA: Diagnosis not present

## 2017-04-13 DIAGNOSIS — M546 Pain in thoracic spine: Secondary | ICD-10-CM | POA: Diagnosis not present

## 2017-04-18 DIAGNOSIS — M546 Pain in thoracic spine: Secondary | ICD-10-CM | POA: Diagnosis not present

## 2017-04-18 DIAGNOSIS — M542 Cervicalgia: Secondary | ICD-10-CM | POA: Diagnosis not present

## 2017-04-18 DIAGNOSIS — M9901 Segmental and somatic dysfunction of cervical region: Secondary | ICD-10-CM | POA: Diagnosis not present

## 2017-04-18 DIAGNOSIS — M9902 Segmental and somatic dysfunction of thoracic region: Secondary | ICD-10-CM | POA: Diagnosis not present

## 2017-04-20 DIAGNOSIS — M542 Cervicalgia: Secondary | ICD-10-CM | POA: Diagnosis not present

## 2017-04-20 DIAGNOSIS — M9901 Segmental and somatic dysfunction of cervical region: Secondary | ICD-10-CM | POA: Diagnosis not present

## 2017-04-20 DIAGNOSIS — M546 Pain in thoracic spine: Secondary | ICD-10-CM | POA: Diagnosis not present

## 2017-04-20 DIAGNOSIS — M9902 Segmental and somatic dysfunction of thoracic region: Secondary | ICD-10-CM | POA: Diagnosis not present

## 2017-04-25 DIAGNOSIS — M9901 Segmental and somatic dysfunction of cervical region: Secondary | ICD-10-CM | POA: Diagnosis not present

## 2017-04-25 DIAGNOSIS — M546 Pain in thoracic spine: Secondary | ICD-10-CM | POA: Diagnosis not present

## 2017-04-25 DIAGNOSIS — M542 Cervicalgia: Secondary | ICD-10-CM | POA: Diagnosis not present

## 2017-04-25 DIAGNOSIS — M9902 Segmental and somatic dysfunction of thoracic region: Secondary | ICD-10-CM | POA: Diagnosis not present

## 2017-04-25 NOTE — Telephone Encounter (Signed)
Called foundation to check the status of pts application. Spoke with Marcelino Duster who states that the application has been received but it is still being processed. We should have a decision soon. Will update once we receive a response.   Jackelyn Illingworth, [place], CPhT 11:41 AM

## 2017-04-27 DIAGNOSIS — M546 Pain in thoracic spine: Secondary | ICD-10-CM | POA: Diagnosis not present

## 2017-04-27 DIAGNOSIS — Z961 Presence of intraocular lens: Secondary | ICD-10-CM | POA: Diagnosis not present

## 2017-04-27 DIAGNOSIS — H04123 Dry eye syndrome of bilateral lacrimal glands: Secondary | ICD-10-CM | POA: Diagnosis not present

## 2017-04-27 DIAGNOSIS — M9902 Segmental and somatic dysfunction of thoracic region: Secondary | ICD-10-CM | POA: Diagnosis not present

## 2017-04-27 DIAGNOSIS — M542 Cervicalgia: Secondary | ICD-10-CM | POA: Diagnosis not present

## 2017-04-27 DIAGNOSIS — M9901 Segmental and somatic dysfunction of cervical region: Secondary | ICD-10-CM | POA: Diagnosis not present

## 2017-04-28 ENCOUNTER — Telehealth: Payer: Self-pay

## 2017-04-28 MED ORDER — ETANERCEPT 50 MG/ML ~~LOC~~ SOAJ: 50.0000 mg | SUBCUTANEOUS | 0 refills | Status: DC

## 2017-04-28 NOTE — Telephone Encounter (Signed)
Prescription faxed to [employer].  

## 2017-04-28 NOTE — Telephone Encounter (Signed)
Last Visit: 01/06/17  Next visit: 06/08/17 Labs: 04/06/17 K is high . Could be hemolyzed sample. TB Gold: 04/06/17 Neg  Okay to refill per Dr. Corliss Skains

## 2017-04-28 NOTE — Telephone Encounter (Signed)
Patient is requesting a refill on Enbrel to be sent to the patient assistance foundation. She is due for a refill. They can be reached at [phone] [facility] Safety Note)  Becky [name], [place], CPhT 10:09 AM

## 2017-05-02 DIAGNOSIS — H43822 Vitreomacular adhesion, left eye: Secondary | ICD-10-CM | POA: Diagnosis not present

## 2017-05-02 DIAGNOSIS — E103551 Type 1 diabetes mellitus with stable proliferative diabetic retinopathy, right eye: Secondary | ICD-10-CM | POA: Diagnosis not present

## 2017-05-02 DIAGNOSIS — H43821 Vitreomacular adhesion, right eye: Secondary | ICD-10-CM | POA: Diagnosis not present

## 2017-05-02 DIAGNOSIS — H4321 Crystalline deposits in vitreous body, right eye: Secondary | ICD-10-CM | POA: Diagnosis not present

## 2017-05-02 DIAGNOSIS — M542 Cervicalgia: Secondary | ICD-10-CM | POA: Diagnosis not present

## 2017-05-02 DIAGNOSIS — M9902 Segmental and somatic dysfunction of thoracic region: Secondary | ICD-10-CM | POA: Diagnosis not present

## 2017-05-02 DIAGNOSIS — M546 Pain in thoracic spine: Secondary | ICD-10-CM | POA: Diagnosis not present

## 2017-05-02 DIAGNOSIS — M9901 Segmental and somatic dysfunction of cervical region: Secondary | ICD-10-CM | POA: Diagnosis not present

## 2017-05-03 ENCOUNTER — Encounter: Payer: Self-pay | Admitting: Podiatry

## 2017-05-03 ENCOUNTER — Telehealth: Payer: Self-pay | Admitting: *Deleted

## 2017-05-03 ENCOUNTER — Ambulatory Visit (INDEPENDENT_AMBULATORY_CARE_PROVIDER_SITE_OTHER): Payer: Medicare Other | Admitting: Podiatry

## 2017-05-03 DIAGNOSIS — R0989 Other specified symptoms and signs involving the circulatory and respiratory systems: Secondary | ICD-10-CM | POA: Diagnosis not present

## 2017-05-03 DIAGNOSIS — L600 Ingrowing nail: Secondary | ICD-10-CM | POA: Diagnosis not present

## 2017-05-03 NOTE — Progress Notes (Signed)
   Subjective:    Patient ID: Becky [name], female    DOB: 08/03/1955, 61 y.o.   MRN: [id number]  HPI  Becky [name] presents the office today for concerns of chronic ingrown toenails to both of her big toes is been ongoing for several years.  She states that about once every 2 years the toenail will get infected and the nails become off and she is concerned about this as she has been diabetic for 15 years.  Her last A1c was 7.3.  She gets regular pedicures and she was told by her pedicurist that the nails were too big for the nail beds.  She currently denies any drainage or pus but she does have tenderness in the corners for which she has had for several years.  She does have neuropathy as well.  She has a history of a wound to the left fourth toe that resulted in osteomyelitis and she had part of the bone resected and she did not want amputation.  This eventually healed.  She denies any open sores currently.  She has no other concerns.  She is also on Enbrel and methotrexate for rheumatoid arthritis.   Review of Systems  All other systems reviewed and are negative.      Objective:   Physical Exam  General: AAO x3, NAD  Dermatological: There is incurvation present in both the medial and lateral aspects of bilateral hallux toenails with tenderness to palpation towards the distal aspect.  There is no drainage or pus expressed and there is no edema, erythema, ascending cellulitis.  There is no clinical signs of infection present.  There is currently no other lesions or pre-ulcerative lesions identified bilaterally.  Vascular: DP pulse 2/4 in the right side but otherwise pulses are decreased.  CRT less than 3 seconds.  She denies any claudication symptoms.  Neruologic: Sensation decreased with Dorann Ou monofilament.  She is previously been diagnosed with neuropathy as well.  Musculoskeletal: No gross boney pedal deformities bilateral. No pain, crepitus, or limitation noted with foot  and ankle range of motion bilateral. Muscular strength 5/5 in all groups tested bilateral.  Gait: Unassisted, Nonantalgic.     Assessment & Plan:  61 year old female with bilateral chronic ingrown toenails, decreased pulses -Treatment options discussed including all alternatives, risks, and complications -Etiology of symptoms were discussed -We discussed partial nail avulsion with chemical matricectomy.  Given the decreased pulses as well as her other medications recommend to hold off on this today.  I ordered arterial studies to evaluate circulation prior to the procedure.  If normal will proceed with the partial nail avulsions of the nails however we will hold Enbrel and methotrexate.  She agrees this plan. -Follow-up after arterial studies or sooner if any issues were to arise.  Vivi Barrack DPM

## 2017-05-03 NOTE — Telephone Encounter (Addendum)
-----   Message from Vivi Barrack, DPM sent at 05/03/2017 10:00 AM EST ----- Can you please order arterial studies due to decreased pulses, and get toe pressures? Thanks. Faxed orders to [facility].

## 2017-05-04 ENCOUNTER — Other Ambulatory Visit: Payer: Self-pay | Admitting: Rheumatology

## 2017-05-04 DIAGNOSIS — M9902 Segmental and somatic dysfunction of thoracic region: Secondary | ICD-10-CM | POA: Diagnosis not present

## 2017-05-04 DIAGNOSIS — M9901 Segmental and somatic dysfunction of cervical region: Secondary | ICD-10-CM | POA: Diagnosis not present

## 2017-05-04 DIAGNOSIS — M542 Cervicalgia: Secondary | ICD-10-CM | POA: Diagnosis not present

## 2017-05-04 DIAGNOSIS — M546 Pain in thoracic spine: Secondary | ICD-10-CM | POA: Diagnosis not present

## 2017-05-06 NOTE — Telephone Encounter (Signed)
Last Visit: 01/06/17 Next Visit: 06/08/17 Labs: 04/06/17 K is high . Could be hemolyzed sample. Elevated glucose. Otherwise normal   Okay to refill per Dr. Corliss Skains

## 2017-05-09 DIAGNOSIS — M9902 Segmental and somatic dysfunction of thoracic region: Secondary | ICD-10-CM | POA: Diagnosis not present

## 2017-05-09 DIAGNOSIS — M546 Pain in thoracic spine: Secondary | ICD-10-CM | POA: Diagnosis not present

## 2017-05-09 DIAGNOSIS — M9901 Segmental and somatic dysfunction of cervical region: Secondary | ICD-10-CM | POA: Diagnosis not present

## 2017-05-09 DIAGNOSIS — M542 Cervicalgia: Secondary | ICD-10-CM | POA: Diagnosis not present

## 2017-05-10 NOTE — Telephone Encounter (Signed)
Called foundation to check the status of patient's application. Spoke to [place] who states that the application is pending a benefits investigation. Will update once we receive a response.   Orie Cuttino, [place], CPhT 2:11 PM

## 2017-05-16 DIAGNOSIS — M546 Pain in thoracic spine: Secondary | ICD-10-CM | POA: Diagnosis not present

## 2017-05-16 DIAGNOSIS — M9901 Segmental and somatic dysfunction of cervical region: Secondary | ICD-10-CM | POA: Diagnosis not present

## 2017-05-16 DIAGNOSIS — M542 Cervicalgia: Secondary | ICD-10-CM | POA: Diagnosis not present

## 2017-05-16 DIAGNOSIS — M9902 Segmental and somatic dysfunction of thoracic region: Secondary | ICD-10-CM | POA: Diagnosis not present

## 2017-05-18 DIAGNOSIS — M9902 Segmental and somatic dysfunction of thoracic region: Secondary | ICD-10-CM | POA: Diagnosis not present

## 2017-05-18 DIAGNOSIS — M542 Cervicalgia: Secondary | ICD-10-CM | POA: Diagnosis not present

## 2017-05-18 DIAGNOSIS — M546 Pain in thoracic spine: Secondary | ICD-10-CM | POA: Diagnosis not present

## 2017-05-18 DIAGNOSIS — M9901 Segmental and somatic dysfunction of cervical region: Secondary | ICD-10-CM | POA: Diagnosis not present

## 2017-05-23 ENCOUNTER — Ambulatory Visit
Admission: RE | Admit: 2017-05-23 | Discharge: 2017-05-23 | Disposition: A | Discharge status: Home / Self Care | Payer: Medicare Other | Source: Ambulatory Visit | Attending: Oncology | Admitting: Oncology

## 2017-05-23 DIAGNOSIS — G518 Other disorders of facial nerve: Secondary | ICD-10-CM | POA: Diagnosis not present

## 2017-05-23 DIAGNOSIS — Z1231 Encounter for screening mammogram for malignant neoplasm of breast: Secondary | ICD-10-CM

## 2017-05-23 DIAGNOSIS — M791 Myalgia, unspecified site: Secondary | ICD-10-CM | POA: Diagnosis not present

## 2017-05-23 DIAGNOSIS — M542 Cervicalgia: Secondary | ICD-10-CM | POA: Diagnosis not present

## 2017-05-23 DIAGNOSIS — G43719 Chronic migraine without aura, intractable, without status migrainosus: Secondary | ICD-10-CM | POA: Diagnosis not present

## 2017-05-23 DIAGNOSIS — N6489 Other specified disorders of breast: Secondary | ICD-10-CM | POA: Diagnosis not present

## 2017-05-23 MED ORDER — GADOBENATE DIMEGLUMINE 529 MG/ML IV SOLN
12.0000 mL | Freq: Once | INTRAVENOUS | Status: AC | PRN
  Administered 2017-05-23: 12 mL via INTRAVENOUS

## 2017-05-25 DIAGNOSIS — M542 Cervicalgia: Secondary | ICD-10-CM | POA: Diagnosis not present

## 2017-05-25 DIAGNOSIS — M9902 Segmental and somatic dysfunction of thoracic region: Secondary | ICD-10-CM | POA: Diagnosis not present

## 2017-05-25 DIAGNOSIS — M9901 Segmental and somatic dysfunction of cervical region: Secondary | ICD-10-CM | POA: Diagnosis not present

## 2017-05-25 DIAGNOSIS — M546 Pain in thoracic spine: Secondary | ICD-10-CM | POA: Diagnosis not present

## 2017-05-26 ENCOUNTER — Ambulatory Visit (HOSPITAL_COMMUNITY)
Admission: RE | Admit: 2017-05-26 | Discharge: 2017-05-26 | Disposition: A | Discharge status: Home / Self Care | Payer: Medicare Other | Source: Ambulatory Visit | Attending: Cardiology | Admitting: Cardiology

## 2017-05-26 DIAGNOSIS — R0989 Other specified symptoms and signs involving the circulatory and respiratory systems: Secondary | ICD-10-CM | POA: Diagnosis not present

## 2017-05-27 ENCOUNTER — Encounter: Payer: Self-pay | Admitting: Podiatry

## 2017-05-28 NOTE — Progress Notes (Addendum)
Office Visit Note  [name]             Date of Birth: 1955/09/26           MRN: [id number]             PCP: Adrian Prince, MD Referring: Adrian Prince, MD Visit Date: 06/08/2017 Occupation: @GUAROCC @    Subjective:  Neck and lower back pain.   History of Present Illness: Becky [name] is a 61 y.o. female with history of sero positive rheumatoid arthritis, Sjogren's and osteoarthritis. She states her rheumatoid arthritis is fairly well controlled on Enbrel and methotrexate combination. She's been seeing her ophthalmologist on a regular basis and had not had any flares of iritis. She's been having increased pain in her C-spine and lumbar spine. She's been seeing a chiropractor for the last 6 months. She has not noticed much improvement in her neck and lower back pain. She states her knee joint pain has improved.  Activities of Daily Living:  Patient reports morning stiffness for 5-10 minutes.   Patient Reports nocturnal pain.  Difficulty dressing/grooming: Denies Difficulty climbing stairs: Denies Difficulty getting out of chair: Denies Difficulty using hands for taps, buttons, cutlery, and/or writing: Denies   Review of Systems  Constitutional: Negative for fatigue, night sweats, weight gain, weight loss and weakness.  HENT: Positive for mouth dryness. Negative for mouth sores, trouble swallowing, trouble swallowing and nose dryness.   Eyes: Positive for dryness. Negative for pain, redness and visual disturbance.  Respiratory: Negative for cough, shortness of breath and difficulty breathing.   Cardiovascular: Negative for chest pain, palpitations, hypertension, irregular heartbeat and swelling in legs/feet.  Gastrointestinal: Positive for constipation. Negative for blood in stool and diarrhea.  Endocrine: Negative for increased urination.  Genitourinary: Negative for vaginal dryness.  Musculoskeletal: Positive for arthralgias, joint pain and morning  stiffness. Negative for joint swelling, myalgias, muscle weakness, muscle tenderness and myalgias.  Skin: Negative for color change, rash, hair loss, skin tightness, ulcers and sensitivity to sunlight.  Allergic/Immunologic: Negative for susceptible to infections.  Neurological: Negative for dizziness, memory loss and night sweats.  Hematological: Negative for swollen glands.  Psychiatric/Behavioral: Negative for depressed mood and sleep disturbance. The patient is not nervous/anxious.     PMFS History:  Patient Active Problem List   Diagnosis Date Noted  . Eosinophilia 01/31/2017  . Iritis 01/04/2017  . History of joint swelling 08/24/2016  . Osteoarthritis of knees, bilateral 08/24/2016  . History of migraine 08/24/2016  . History of depression 08/24/2016  . History of osteoporosis 08/24/2016  . DDD (degenerative disc disease), lumbar 08/24/2016  . High risk medication use 08/24/2016  . Trigger finger, left middle finger 08/24/2016  . Trigger finger, right ring finger 08/24/2016  . 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 Increased risk of breast cancer 07/06/2015  . IDDM (insulin dependent diabetes mellitus) (HCC) 07/06/2015  . 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 Sjogren's syndrome (HCC) 07/06/2015  . Genetic testing 05/12/2015  . Family history of breast cancer 04/15/2015  . Fibrocystic breast disease 10/11/2012  . GERD 10/01/2009  . Chest pain with moderate risk for cardiac etiology 09/02/2009  . DYSPHAGIA 09/02/2009  . DIABETES MELLITUS-TYPE II 08/15/2009  . ANXIETY 08/15/2009  . ULCER-GASTRIC 08/15/2009  . CONSTIPATION 08/15/2009  . SLOW TRANSIT CONSTIPATION 08/15/2009  . FLATULENCE ERUCTATION AND GAS PAIN 08/15/2009    Past Medical History:  Diagnosis Date  . Abnormal ECG   .  Anemia   . Anxiety   . Cataract    . Cateracts bil eyes removed  . GERD (gastroesophageal reflux disease)   .  Heart murmur   . Hypercholesterolemia   . IDDM (insulin dependent diabetes mellitus) (HCC) 1969  . Migraine headache   . Neuromuscular disorder (HCC)    bil legs & feet - neuropathy  . Neuropathy   . Osteoporosis    10/2012  . Otosclerosis   . RA (rheumatoid arthritis) (HCC) 2000  . S/P cardiac cath August 2013   Normal coronaries and normal LV function.  . Sjoegren syndrome (HCC)     Family History  Problem Relation Age of Onset  . Breast cancer Mother [id number]  . Colon polyps Mother        unspecified number  . Breast cancer Maternal Grandmother 3751  . Stroke Father 7675  . Rheum arthritis Sister   . Breast cancer Sister 48       lump; (-)GT (breast/ova ca panel, GeneDx)  . Skin cancer Maternal Grandfather        unspecified type  . Prostate cancer Maternal Grandfather        dx. late 5870s  . Breast cancer Other        dx. in their late 7660s  . Breast cancer Other        dx. 78-79  . Breast cancer Cousin        bilateral; dx. late 4850s  . Colon cancer Neg Hx   . Esophageal cancer Neg Hx   . Rectal cancer Neg Hx   . Stomach cancer Neg Hx    Past Surgical History:  Procedure Laterality Date  . BREAST LUMPECTOMY  1995  . CARPAL TUNNEL RELEASE  2000  . CATARACT EXTRACTION Right 09-05-12  . CATARACT EXTRACTION Left 3/15  . CESAREAN SECTION  1987  . COLONOSCOPY    . EYE SURGERY     diabetic retinopathy   . HYSTEROSCOPY  02/2001   and D & C  . LYMPH NODE DISSECTION  2002  . TOE SURGERY  2000   bone removed   Social History   Social History Narrative  . Not on file     Objective: Vital Signs: BP 136/71 (BP Location: Left Arm, Patient Position: Sitting, Cuff Size: Small)   Pulse 91   Wt 129 lb (58.5 kg)   LMP 09/19/2008   BMI 22.14 kg/m    Physical Exam  Constitutional: She is oriented to person, place, and time. She appears well-developed and well-nourished.  HENT:  Head: Normocephalic and atraumatic.  Eyes: Conjunctivae and EOM are normal.  Neck: Normal range  of motion.  Cardiovascular: Normal rate, regular rhythm, normal heart sounds and intact distal pulses.  Pulmonary/Chest: Effort normal and breath sounds normal.  Abdominal: Soft. Bowel sounds are normal.  Lymphadenopathy:    She has no cervical adenopathy.  Neurological: She is alert and oriented to person, place, and time.  Skin: Skin is warm and dry. Capillary refill takes less than 2 seconds.  Psychiatric: She has a normal mood and affect. Her behavior is normal.  Nursing note and vitals reviewed.    Musculoskeletal Exam: C spine good range of motion with some discomfort. Thoracic spine good range of motion. She discomfort range of motion of her lumbar spine. Shoulder joints elbow joints wrist joints are good range of motion. She synovial thickening over MCP joints and PIP joints but no synovitis was noted. Hip joints knee joints ankles were in  good range of motion with no synovitis or synovial thickening. She has some postsurgical changes in her left fourth toe. No synovitis was noted.  CDAI Exam: CDAI Homunculus Exam:   Joint Counts:  CDAI Tender Joint count: 0 CDAI Swollen Joint count: 0  Global Assessments:  Patient Global Assessment: 1 Provider Global Assessment: 1  CDAI Calculated Score: 2    Investigation: No additional findings.TB Gold: 04/06/2017 Negative  CBC Latest Ref Rng & Units 04/06/2017 01/05/2017 09/13/2016  WBC 3.8 - 10.8 Thousand/uL 4.6 5.2 4.8  Hemoglobin 11.7 - 15.5 g/dL 75.1 02.5 85.2  Hematocrit 35.0 - 45.0 % 40.2 41.9 38.3  Platelets 140 - 400 Thousand/uL 268 262 245   CMP Latest Ref Rng & Units 04/06/2017 01/05/2017 09/13/2016  Glucose 65 - 99 mg/dL 778(E) 95 73  BUN 7 - 25 mg/dL 15 42.3 14  Creatinine 0.50 - 0.99 mg/dL 5.36 1.0 1.44(R)  Sodium 135 - 146 mmol/L 144 142 140  Potassium 3.5 - 5.3 mmol/L 5.8(H) 4.3 3.9  Chloride 98 - 110 mmol/L 108 - 106  CO2 20 - 32 mmol/L 29 24 25   Calcium 8.6 - 10.4 mg/dL 9.7 9.3  Total Protein 6.1 - 8.1 g/dL  7.1 7.1 -  Total Bilirubin 0.2 - 1.2 mg/dL 0.6 15.4 -  Alkaline Phos 40 - 150 U/L - 67 -  AST 10 - 35 U/L 27 24 -  ALT 6 - 29 U/L 22 18 -    Imaging: Mr Breast Bilateral W Wo Contrast  Result Date: 05/23/2017 CLINICAL DATA:  61 year old patient has a strong family history of breast cancer in her grandmother, mother and sister. Patient has dense breast tissue and has a history of prior right breast surgeries. LABS:  Creatinine was obtained on site at [facility] at [address] Results: Creatinine 0.8 mg/dL. EXAM: BILATERAL BREAST MRI WITH AND WITHOUT CONTRAST TECHNIQUE: Multiplanar, multisequence MR images of both breasts were obtained prior to and following the intravenous administration of 12 ml of MultiHance. THREE-DIMENSIONAL MR IMAGE RENDERING ON INDEPENDENT WORKSTATION: Three-dimensional MR images were rendered by post-processing of the original MR data on an independent workstation. The three-dimensional MR images were interpreted, and findings are reported in the following complete MRI report for this study. Three dimensional images were evaluated at the independent DynaCad workstation COMPARISON:  Bilateral mammogram Nov 16, 2016 and prior breast MRIs dated 04/05/2016 and 11/22/2013 FINDINGS: Breast composition: d. Extreme fibroglandular tissue. Background parenchymal enhancement: Mild Right breast: No mass or abnormal enhancement. Left breast: No mass or abnormal enhancement. Lymph nodes: No abnormal appearing lymph nodes. Ancillary findings:  None. IMPRESSION: No MRI evidence of malignancy in either breast. RECOMMENDATION: Bilateral screening mammogram is recommended in May 2019. BI-RADS CATEGORY  1: Negative. Electronically Signed   By: June 2019 M.D.   On: 05/23/2017 13:11   Xr Cervical Spine 2 Or 3 Views  Result Date: 06/08/2017 Multilevel spondylosis with severe narrowing between C5 and C6 anterior spurring mild facet joint arthropathy.   Speciality Comments: No  specialty comments available.    Procedures:  No procedures performed Allergies: Ciprofloxacin and Penicillins   Assessment / Plan:     Visit Diagnoses: 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 without organ or systems involvement [facility]): Patient is clinically doing better on Enbrel and methotrexate combination. She had no synovitis on examination today. She has synovial thickening.  Iritis: She's had no iritis flare.  Sjogren's syndrome with keratoconjunctivitis sicca (HCC): She continues to have sicca  symptoms for which she's been using over-the-counter products and they've been helpful.  High risk medication use - Enbrel 50 mg by mouth every week, methotrexate 0.8 ML subcutaneous every week, folic acid 2 mg by mouth daily(Orencia -discontinued due to the cost). Her labs have been stable. We will continue to monitor her labs closely.  Primary osteoarthritis of both knees: The knee joint discomfort has been better after chiropractor care.  Neck pain - Plan: XR Cervical Spine 2 or 3 views. X-rays obtained today showed multilevel spondylosis with severe narrowing between C5 and C6. She is not having any radiculopathy. She has  been having a lot of discomfort in her C-spine. I'll refer to physical therapy.  DDD (degenerative disc disease), lumbar: She continues to have lower back pain. I will refer her to physical therapy.  History of osteoporosis - on Prolia per Dr. Evlyn Kanner - Plan: VITAMIN D 25 Hydroxy (Vit-D Deficiency, Fractures)  Other medical problems are listed as follows:  History of migraine  History of depression - Better on Cymbalta  History of gastroesophageal reflux (GERD)  History of diabetes mellitus  Increased risk of breast cancer    Orders: Orders Placed This Encounter  Procedures  . XR Cervical Spine 2 or 3 views  . VITAMIN D 25 Hydroxy (Vit-D Deficiency, Fractures)  . Ambulatory referral to Physical Therapy   No orders of the defined  types were placed in this encounter.   Face-to-face time spent with patient was 30 minutes. Greater than 50% of time was spent in counseling and coordination of care.  Follow-Up Instructions: Return in about 5 months (around 11/06/2017) for Rheumatoid arthritis, Osteoarthritis, DDD.   Pollyann Savoy, MD  Note - This record has been created using Animal nutritionist.  Chart creation errors have been sought, but may not always  have been located. Such creation errors do not reflect on  the standard of medical care.

## 2017-06-02 ENCOUNTER — Telehealth: Payer: Self-pay | Admitting: Obstetrics and Gynecology

## 2017-06-02 NOTE — Telephone Encounter (Signed)
Left message for patient cancelling 06/24/17 appt with Dr Silva °

## 2017-06-03 DIAGNOSIS — Z4681 Encounter for fitting and adjustment of insulin pump: Secondary | ICD-10-CM | POA: Diagnosis not present

## 2017-06-03 DIAGNOSIS — E1065 Type 1 diabetes mellitus with hyperglycemia: Secondary | ICD-10-CM | POA: Diagnosis not present

## 2017-06-03 DIAGNOSIS — E1040 Type 1 diabetes mellitus with diabetic neuropathy, unspecified: Secondary | ICD-10-CM | POA: Diagnosis not present

## 2017-06-03 DIAGNOSIS — N183 Chronic kidney disease, stage 3 (moderate): Secondary | ICD-10-CM | POA: Diagnosis not present

## 2017-06-03 DIAGNOSIS — I10 Essential (primary) hypertension: Secondary | ICD-10-CM | POA: Diagnosis not present

## 2017-06-06 DIAGNOSIS — M546 Pain in thoracic spine: Secondary | ICD-10-CM | POA: Diagnosis not present

## 2017-06-06 DIAGNOSIS — M542 Cervicalgia: Secondary | ICD-10-CM | POA: Diagnosis not present

## 2017-06-06 DIAGNOSIS — M9902 Segmental and somatic dysfunction of thoracic region: Secondary | ICD-10-CM | POA: Diagnosis not present

## 2017-06-06 DIAGNOSIS — M9901 Segmental and somatic dysfunction of cervical region: Secondary | ICD-10-CM | POA: Diagnosis not present

## 2017-06-08 ENCOUNTER — Encounter: Payer: Self-pay | Admitting: Rheumatology

## 2017-06-08 ENCOUNTER — Ambulatory Visit (INDEPENDENT_AMBULATORY_CARE_PROVIDER_SITE_OTHER): Payer: Medicare Other

## 2017-06-08 ENCOUNTER — Ambulatory Visit: Payer: Medicare Other | Admitting: Rheumatology

## 2017-06-08 VITALS — BP 136/71 | HR 91 | Wt 129.0 lb

## 2017-06-08 DIAGNOSIS — Z79899 Other long term (current) drug therapy: Secondary | ICD-10-CM | POA: Diagnosis not present

## 2017-06-08 DIAGNOSIS — M0579 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 without organ or systems involvement: Secondary | ICD-10-CM

## 2017-06-08 DIAGNOSIS — Z8659 Personal history of other mental and behavioral disorders: Secondary | ICD-10-CM | POA: Diagnosis not present

## 2017-06-08 DIAGNOSIS — M5136 Other intervertebral disc degeneration, lumbar region: Secondary | ICD-10-CM

## 2017-06-08 DIAGNOSIS — M3501 Sicca syndrome with keratoconjunctivitis: Secondary | ICD-10-CM | POA: Diagnosis not present

## 2017-06-08 DIAGNOSIS — Z8669 Personal history of other diseases of the nervous system and sense organs: Secondary | ICD-10-CM

## 2017-06-08 DIAGNOSIS — Z8719 Personal history of other diseases of the digestive system: Secondary | ICD-10-CM | POA: Diagnosis not present

## 2017-06-08 DIAGNOSIS — M542 Cervicalgia: Secondary | ICD-10-CM

## 2017-06-08 DIAGNOSIS — Z8639 Personal history of other endocrine, nutritional and metabolic disease: Secondary | ICD-10-CM | POA: Diagnosis not present

## 2017-06-08 DIAGNOSIS — H209 Unspecified iridocyclitis: Secondary | ICD-10-CM

## 2017-06-08 DIAGNOSIS — Z8739 Personal history of other diseases of the musculoskeletal system and connective tissue: Secondary | ICD-10-CM

## 2017-06-08 DIAGNOSIS — M170 Bilateral primary osteoarthritis of knee: Secondary | ICD-10-CM | POA: Diagnosis not present

## 2017-06-08 DIAGNOSIS — Z9189 Other specified personal risk factors, not elsewhere classified: Secondary | ICD-10-CM

## 2017-06-08 NOTE — Patient Instructions (Addendum)
Standing Labs We placed an order today for your standing lab work.    Please come back and get your standing labs in January and every 3 months Vitamin D level will be checked in January We have open lab Monday through Friday from 8:30-11:30 AM and 1:30-4 PM at the office of Dr. Pollyann Savoy.   The office is located at [address], [place], [place] [address] No appointment is necessary.   Labs are drawn by [facility].  You may receive a bill from [facility] for your lab work. If you have any questions regarding directions or hours of operation,  please call [phone].

## 2017-06-22 DIAGNOSIS — M9901 Segmental and somatic dysfunction of cervical region: Secondary | ICD-10-CM | POA: Diagnosis not present

## 2017-06-22 DIAGNOSIS — M542 Cervicalgia: Secondary | ICD-10-CM | POA: Diagnosis not present

## 2017-06-22 DIAGNOSIS — M9902 Segmental and somatic dysfunction of thoracic region: Secondary | ICD-10-CM | POA: Diagnosis not present

## 2017-06-22 DIAGNOSIS — M546 Pain in thoracic spine: Secondary | ICD-10-CM | POA: Diagnosis not present

## 2017-06-24 ENCOUNTER — Ambulatory Visit: Payer: Medicare Other | Admitting: Obstetrics and Gynecology

## 2017-06-27 ENCOUNTER — Ambulatory Visit: Payer: Medicare Other | Admitting: Nurse Practitioner

## 2017-06-27 DIAGNOSIS — M546 Pain in thoracic spine: Secondary | ICD-10-CM | POA: Diagnosis not present

## 2017-06-27 DIAGNOSIS — M9902 Segmental and somatic dysfunction of thoracic region: Secondary | ICD-10-CM | POA: Diagnosis not present

## 2017-06-27 DIAGNOSIS — M542 Cervicalgia: Secondary | ICD-10-CM | POA: Diagnosis not present

## 2017-06-27 DIAGNOSIS — M9901 Segmental and somatic dysfunction of cervical region: Secondary | ICD-10-CM | POA: Diagnosis not present

## 2017-07-04 DIAGNOSIS — G43719 Chronic migraine without aura, intractable, without status migrainosus: Secondary | ICD-10-CM | POA: Diagnosis not present

## 2017-07-04 DIAGNOSIS — M791 Myalgia, unspecified site: Secondary | ICD-10-CM | POA: Diagnosis not present

## 2017-07-04 DIAGNOSIS — G518 Other disorders of facial nerve: Secondary | ICD-10-CM | POA: Diagnosis not present

## 2017-07-04 DIAGNOSIS — M542 Cervicalgia: Secondary | ICD-10-CM | POA: Diagnosis not present

## 2017-07-04 DIAGNOSIS — E1065 Type 1 diabetes mellitus with hyperglycemia: Secondary | ICD-10-CM | POA: Diagnosis not present

## 2017-07-05 DIAGNOSIS — M25561 Pain in right knee: Secondary | ICD-10-CM | POA: Diagnosis not present

## 2017-07-05 DIAGNOSIS — M25562 Pain in left knee: Secondary | ICD-10-CM | POA: Diagnosis not present

## 2017-07-05 DIAGNOSIS — M545 Low back pain: Secondary | ICD-10-CM | POA: Diagnosis not present

## 2017-07-05 DIAGNOSIS — M542 Cervicalgia: Secondary | ICD-10-CM | POA: Diagnosis not present

## 2017-07-06 ENCOUNTER — Telehealth: Payer: Self-pay

## 2017-07-06 NOTE — Telephone Encounter (Signed)
[employer] to check the status of pts application. Spoke with Daisa who states that the pt application is currently in the last stage of processing (insurance verification). She could not give me a timeframe when we would have a response. She is hoping by the end of the month.   Called pt to update. Patient has two weeks of medication left. She will contact us when she is out and have not heard back from [employer].   Becky [name], [place], CPhT 11:57 AM

## 2017-07-08 DIAGNOSIS — L578 Other skin changes due to chronic exposure to nonionizing radiation: Secondary | ICD-10-CM | POA: Diagnosis not present

## 2017-07-08 DIAGNOSIS — W908XXS Exposure to other nonionizing radiation, sequela: Secondary | ICD-10-CM | POA: Diagnosis not present

## 2017-07-08 DIAGNOSIS — L570 Actinic keratosis: Secondary | ICD-10-CM | POA: Diagnosis not present

## 2017-07-08 NOTE — Telephone Encounter (Signed)
Received a fax from [employer] stating that pt currently meets the eligibility criteria to receive Enbrel through the foundation at no cost from 01/11/2017 through 01/11/2018. Continued enrollment is not a guarantee. At the end of the enrollment period, if pt need continued support, they must re-apply and re-qualify for the program.  Will send document to scan center.   Called pt to update. Left message  Abran Duke, CPhT 2:49 PM

## 2017-07-11 DIAGNOSIS — E1142 Type 2 diabetes mellitus with diabetic polyneuropathy: Secondary | ICD-10-CM | POA: Diagnosis not present

## 2017-07-11 DIAGNOSIS — E1040 Type 1 diabetes mellitus with diabetic neuropathy, unspecified: Secondary | ICD-10-CM | POA: Diagnosis not present

## 2017-07-11 DIAGNOSIS — H3520 Other non-diabetic proliferative retinopathy, unspecified eye: Secondary | ICD-10-CM | POA: Diagnosis not present

## 2017-07-11 DIAGNOSIS — M069 Rheumatoid arthritis, unspecified: Secondary | ICD-10-CM | POA: Diagnosis not present

## 2017-07-14 DIAGNOSIS — Z794 Long term (current) use of insulin: Secondary | ICD-10-CM | POA: Diagnosis not present

## 2017-07-14 DIAGNOSIS — E109 Type 1 diabetes mellitus without complications: Secondary | ICD-10-CM | POA: Diagnosis not present

## 2017-07-20 DIAGNOSIS — M810 Age-related osteoporosis without current pathological fracture: Secondary | ICD-10-CM | POA: Diagnosis not present

## 2017-08-10 ENCOUNTER — Other Ambulatory Visit: Payer: Self-pay | Admitting: *Deleted

## 2017-08-10 MED ORDER — FOLIC ACID 1 MG PO TABS: 2.0000 mg | ORAL_TABLET | Freq: Every day | ORAL | 3 refills | Status: DC

## 2017-08-10 NOTE — Telephone Encounter (Signed)
Last Visit: 06/08/17 Next Visit 11/09/17  Okay to refill per Dr. Corliss Skains

## 2017-08-15 ENCOUNTER — Other Ambulatory Visit: Payer: Self-pay | Admitting: *Deleted

## 2017-08-15 ENCOUNTER — Other Ambulatory Visit: Payer: Self-pay

## 2017-08-15 DIAGNOSIS — Z8739 Personal history of other diseases of the musculoskeletal system and connective tissue: Secondary | ICD-10-CM

## 2017-08-15 DIAGNOSIS — Z79899 Other long term (current) drug therapy: Secondary | ICD-10-CM

## 2017-08-15 DIAGNOSIS — M791 Myalgia, unspecified site: Secondary | ICD-10-CM | POA: Diagnosis not present

## 2017-08-15 DIAGNOSIS — M542 Cervicalgia: Secondary | ICD-10-CM | POA: Diagnosis not present

## 2017-08-15 DIAGNOSIS — G43719 Chronic migraine without aura, intractable, without status migrainosus: Secondary | ICD-10-CM | POA: Diagnosis not present

## 2017-08-15 DIAGNOSIS — G518 Other disorders of facial nerve: Secondary | ICD-10-CM | POA: Diagnosis not present

## 2017-08-16 ENCOUNTER — Other Ambulatory Visit: Payer: Self-pay | Admitting: *Deleted

## 2017-08-16 LAB — COMPLETE METABOLIC PANEL WITH GFR
AG Ratio: 1.8 (calc) (ref 1.0–2.5)
ALT: 18 U/L (ref 6–29)
AST: 20 U/L (ref 10–35)
Albumin: 4.4 g/dL (ref 3.6–5.1)
Alkaline phosphatase (APISO): 44 U/L (ref 33–130)
BUN: 18 mg/dL (ref 7–25)
CO2: 30 mmol/L (ref 20–32)
Calcium: 9.9 mg/dL (ref 8.6–10.4)
Chloride: 107 mmol/L (ref 98–110)
Creat: 0.95 mg/dL (ref 0.50–0.99)
GFR, Est African American: 75 mL/min/{1.73_m2} (ref >=60)
GFR, Est Non African American: 65 mL/min/{1.73_m2} (ref >=60)
Globulin: 2.4 g/dL (calc) (ref 1.9–3.7)
Glucose, Bld: 90 mg/dL (ref 65–99)
Potassium: 4.9 mmol/L (ref 3.5–5.3)
Sodium: 143 mmol/L (ref 135–146)
Total Bilirubin: 0.4 mg/dL (ref 0.2–1.2)
Total Protein: 6.8 g/dL (ref 6.1–8.1)

## 2017-08-16 LAB — CBC WITH DIFFERENTIAL/PLATELET
Basophils Absolute: 101 cells/uL (ref 0–200)
Basophils Relative: 1.8 %
Eosinophils Absolute: 459 cells/uL (ref 15–500)
Eosinophils Relative: 8.2 %
HCT: 38.7 % (ref 35.0–45.0)
Hemoglobin: 13.2 g/dL (ref 11.7–15.5)
Lymphs Abs: 1803 cells/uL (ref 850–3900)
MCH: 30.8 pg (ref 27.0–33.0)
MCHC: 34.1 g/dL (ref 32.0–36.0)
MCV: 90.4 fL (ref 80.0–100.0)
MPV: 13 fL — ABNORMAL HIGH (ref 7.5–12.5)
Monocytes Relative: 5.9 %
Neutro Abs: 2906 cells/uL (ref 1500–7800)
Neutrophils Relative %: 51.9 %
Platelets: 277 10*3/uL (ref 140–400)
RBC: 4.28 10*6/uL (ref 3.80–5.10)
RDW: 14.5 % (ref 11.0–15.0)
Total Lymphocyte: 32.2 %
WBC mixed population: 330 cells/uL (ref 200–950)
WBC: 5.6 10*3/uL (ref 3.8–10.8)

## 2017-08-16 LAB — VITAMIN D 25 HYDROXY (VIT D DEFICIENCY, FRACTURES): Vit D, 25-Hydroxy: 47 ng/mL (ref 30–100)

## 2017-08-16 MED ORDER — METHOTREXATE SODIUM CHEMO INJECTION 50 MG/2ML: INTRAMUSCULAR | 0 refills | Status: DC

## 2017-08-16 NOTE — Telephone Encounter (Signed)
Last Visit: 06/08/17 Next Visit 11/09/17 Labs: 08/15/17 WNL  Okay to refill per Dr. Corliss Skains

## 2017-09-01 DIAGNOSIS — E1065 Type 1 diabetes mellitus with hyperglycemia: Secondary | ICD-10-CM | POA: Diagnosis not present

## 2017-09-21 ENCOUNTER — Other Ambulatory Visit: Payer: Self-pay

## 2017-09-21 ENCOUNTER — Encounter: Payer: Self-pay | Admitting: Obstetrics and Gynecology

## 2017-09-21 ENCOUNTER — Ambulatory Visit (INDEPENDENT_AMBULATORY_CARE_PROVIDER_SITE_OTHER): Payer: Medicare Other | Admitting: Obstetrics and Gynecology

## 2017-09-21 ENCOUNTER — Other Ambulatory Visit (HOSPITAL_COMMUNITY)
Admission: RE | Admit: 2017-09-21 | Discharge: 2017-09-21 | Disposition: A | Discharge status: Home / Self Care | Payer: Medicare Other | Source: Ambulatory Visit | Attending: Obstetrics and Gynecology | Admitting: Obstetrics and Gynecology

## 2017-09-21 VITALS — BP 118/70 | HR 84 | Resp 16 | Ht 63.5 in | Wt 130.0 lb

## 2017-09-21 DIAGNOSIS — Z1151 Encounter for screening for human papillomavirus (HPV): Secondary | ICD-10-CM | POA: Insufficient documentation

## 2017-09-21 DIAGNOSIS — Z01419 Encounter for gynecological examination (general) (routine) without abnormal findings: Secondary | ICD-10-CM | POA: Diagnosis not present

## 2017-09-21 DIAGNOSIS — Z124 Encounter for screening for malignant neoplasm of cervix: Secondary | ICD-10-CM | POA: Diagnosis not present

## 2017-09-21 NOTE — Patient Instructions (Addendum)
EXERCISE AND DIET:  We recommended that you start or continue a regular exercise program for good health. Regular exercise means any activity that makes your heart beat faster and makes you sweat.  We recommend exercising at least 30 minutes per day at least 3 days a week, preferably 4 or 5.  We also recommend a diet low in fat and sugar.  Inactivity, poor dietary choices and obesity can cause diabetes, heart attack, stroke, and kidney damage, among others.    ALCOHOL AND SMOKING:  Women should limit their alcohol intake to no more than 7 drinks/beers/glasses of wine (combined, not each!) per week. Moderation of alcohol intake to this level decreases your risk of breast cancer and liver damage. And of course, no recreational drugs are part of a healthy lifestyle.  And absolutely no smoking or even second hand smoke. Most people know smoking can cause heart and lung diseases, but did you know it also contributes to weakening of your bones? Aging of your skin?  Yellowing of your teeth and nails?  CALCIUM AND VITAMIN D:  Adequate intake of calcium and Vitamin D are recommended.  The recommendations for exact amounts of these supplements seem to change often, but generally speaking 600 mg of calcium (either carbonate or citrate) and 800 units of Vitamin D per day seems prudent. Certain women may benefit from higher intake of Vitamin D.  If you are among these women, your doctor will have told you during your visit.    PAP SMEARS:  Pap smears, to check for cervical cancer or precancers,  have traditionally been done yearly, although recent scientific advances have shown that most women can have pap smears less often.  However, every woman still should have a physical exam from her gynecologist every year. It will include a breast check, inspection of the vulva and vagina to check for abnormal growths or skin changes, a visual exam of the cervix, and then an exam to evaluate the size and shape of the uterus and  ovaries.  And after 61 years of age, a rectal exam is indicated to check for rectal cancers. We will also provide age appropriate advice regarding health maintenance, like when you should have certain vaccines, screening for sexually transmitted diseases, bone density testing, colonoscopy, mammograms, etc.   MAMMOGRAMS:  All women over 40 years old should have a yearly mammogram. Many facilities now offer a "3D" mammogram, which may cost around $50 extra out of pocket. If possible,  we recommend you accept the option to have the 3D mammogram performed.  It both reduces the number of women who will be called back for extra views which then turn out to be normal, and it is better than the routine mammogram at detecting truly abnormal areas.    COLONOSCOPY:  Colonoscopy to screen for colon cancer is recommended for all women at age 50.  We know, you hate the idea of the prep.  We agree, BUT, having colon cancer and not knowing it is worse!!  Colon cancer so often starts as a polyp that can be seen and removed at colonscopy, which can quite literally save your life!  And if your first colonoscopy is normal and you have no family history of colon cancer, most women don't have to have it again for 10 years.  Once every ten years, you can do something that may end up saving your life, right?  We will be happy to help you get it scheduled when you are ready.    Be sure to check your insurance coverage so you understand how much it will cost.  It may be covered as a preventative service at no cost, but you should check your particular policy.      Kegel Exercises Kegel exercises help strengthen the muscles that support the rectum, vagina, small intestine, bladder, and uterus. Doing Kegel exercises can help:  Improve bladder and bowel control.  Improve sexual response.  Reduce problems and discomfort during pregnancy.  Kegel exercises involve squeezing your pelvic floor muscles, which are the same muscles you  squeeze when you try to stop the flow of urine. The exercises can be done while sitting, standing, or lying down, but it is best to vary your position. Phase 1 exercises 1. Squeeze your pelvic floor muscles tight. You should feel a tight lift in your rectal area. If you are a female, you should also feel a tightness in your vaginal area. Keep your stomach, buttocks, and legs relaxed. 2. Hold the muscles tight for up to 10 seconds. 3. Relax your muscles. Repeat this exercise 50 times a day or as many times as told by your health care provider. Continue to do this exercise for at least 4-6 weeks or for as long as told by your health care provider. This information is not intended to replace advice given to you by your health care provider. Make sure you discuss any questions you have with your health care provider. Document Released: 05/24/2012 Document Revised: 01/31/2016 Document Reviewed: 04/27/2015 Elsevier Interactive Patient Education  2018 [employer]  

## 2017-09-21 NOTE — Progress Notes (Signed)
62 y.o. G85P1001 Married Caucasian female here for annual exam.    No vaginal bleeding or spotting.  Has a lot of vaginal dryness.  Using coconut oil.  Has bilateral lower abdominal cramping from time to time.  Pelvic US 07/22/16 EMS 2.43 mm with a sliver of fluid and no masses.  Ovaries were normal and no free fluid noted.  Urinary incontinence with sneeze.   Weight gain.   PCP:  Dr. Forde Dandy   Patient's last menstrual period was 09/19/2008.           Sexually active: Yes.    The current method of family planning is post menopausal status.    Exercising: Yes.    walking and weights.    Smoker:  no  Health Maintenance: Pap:  03/18/15, Negative with negative HR HPV History of abnormal Pap:  Yes, years ago in 20s -- normal since MMG:  11/16/16 BIRADS 2 benign/density d -- had breast MR -- see Epic for details.  No evidence of malignancy. Colonoscopy:  07/03/15, Normal, repeat in 10 years BMD:   09/2015  Result  Osteoporosis TDaP:  2015 Gardasil:   n/a HIV: negative with PCP Hep C: negative with PCP Screening Labs:  PCP   reports that she has never smoked. She has never used smokeless tobacco. She reports that she drinks about 2.4 oz of alcohol per week. She reports that she does not use drugs.  Past Medical History:  Diagnosis Date  . Abnormal ECG   . Anemia   . Anxiety   . Cataract    . Cateracts bil eyes removed  . GERD (gastroesophageal reflux disease)   . Heart murmur   . Hypercholesterolemia   . IDDM (insulin dependent diabetes mellitus) ([place]) 1969  . Migraine headache   . Neuromuscular disorder (HCC)    bil legs & feet - neuropathy  . Neuropathy   . Osteoporosis    10/2012  . Otosclerosis   . RA (rheumatoid arthritis) ([place]) 2000  . S/P cardiac cath August 2013   Normal coronaries and normal LV function.  . Sjoegren syndrome     Past Surgical History:  Procedure Laterality Date  . BREAST LUMPECTOMY  1995  . CARPAL TUNNEL RELEASE  2000  . CATARACT EXTRACTION  Right 09-05-12  . CATARACT EXTRACTION Left 3/15  . CESAREAN SECTION  1987  . COLONOSCOPY    . EYE SURGERY     diabetic retinopathy   . HYSTEROSCOPY  02/2001   and D & C  . LYMPH NODE DISSECTION  2002  . TOE SURGERY  2000   bone removed    Current Outpatient Medications  Medication Sig Dispense Refill  . aspirin 81 MG tablet Take 81 mg by mouth daily.    . calcium carbonate (OS-CAL) 600 MG TABS Take 600 mg by mouth 2 (two) times daily with a meal.     . cyclobenzaprine (FLEXERIL) 10 MG tablet Take 10 mg by mouth 3 (three) times daily as needed for muscle spasms. Reported on 07/03/2015    . denosumab (PROLIA) 60 MG/ML SOLN injection Inject 60 mg into the skin every 6 (six) months. Administer in upper arm, thigh, or abdomen    . DULoxetine (CYMBALTA) 60 MG capsule Take 60 mg by mouth daily.    Marland Kitchen etanercept (ENBREL SURECLICK) 50 MG/ML injection Inject 0.98 mLs (50 mg total) once a week into the skin. 15.40 mL 0  . folic acid (FOLVITE) 1 MG tablet Take 2 tablets (2 mg total)  by mouth daily. 180 tablet 3  . furosemide (LASIX) 20 MG tablet Take 20 mg by mouth every other day.    . insulin aspart (NOVOLOG) 100 UNIT/ML injection Insulin pump    . losartan (COZAAR) 25 MG tablet Take 50 mg by mouth daily.     . methotrexate 50 MG/2ML injection INJECT 0.8 MILLILITERS INTO THE SKIN ONCE WEEKLY 10 mL 0  . Multiple Vitamin (MULTIVITAMIN) tablet Take 1 tablet by mouth daily.    . POLYETHYLENE GLYCOL 3350 PO Take by mouth 2 (two) times daily.     . pravastatin (PRAVACHOL) 20 MG tablet Take 40 mg by mouth daily.     . SUMAtriptan (IMITREX) 100 MG tablet Take 100 mg by mouth every 2 (two) hours as needed.    . topiramate (TOPAMAX) 200 MG tablet Take 200 mg by mouth 2 (two) times daily.    . Tuberculin-Allergy Syringes 27G X 1/2" 1 ML KIT Inject 1 Syringe into the skin once a week. To be used with weekly methotrexate injections. 12 each 3  . vitamin B-12 (CYANOCOBALAMIN) 1000 MCG tablet Take 1,000 mcg by  mouth daily.     No current facility-administered medications for this visit.     Family History  Problem Relation Age of Onset  . Breast cancer Mother 65  . Colon polyps Mother        unspecified number  . Breast cancer Maternal Grandmother 51  . Stroke Father 75  . Rheum arthritis Sister   . Breast cancer Sister 48       lump; (-)GT (breast/ova ca panel, GeneDx)  . Skin cancer Maternal Grandfather        unspecified type  . Prostate cancer Maternal Grandfather        dx. late 70s  . Breast cancer Other        dx. in their late 60s  . Breast cancer Other        dx. 78-79  . Breast cancer Cousin        bilateral; dx. late 50s  . Colon cancer Neg Hx   . Esophageal cancer Neg Hx   . Rectal cancer Neg Hx   . Stomach cancer Neg Hx     ROS:  Pertinent items are noted in HPI.  Otherwise, a comprehensive ROS was negative.  Exam:   BP 118/70 (BP Location: Right Arm, Patient Position: Sitting, Cuff Size: Normal)   Pulse 84   Resp 16   Ht 5' 3.5" (1.613 m)   Wt 130 lb (59 kg)   LMP 09/19/2008   BMI 22.67 kg/m     General appearance: alert, cooperative and appears stated age Head: Normocephalic, without obvious abnormality, atraumatic Neck: no adenopathy, supple, symmetrical, trachea midline and thyroid normal to inspection and palpation Lungs: clear to auscultation bilaterally Breasts:  Scars of right and left breasts, right breast with firmness at 7:00 lateral to scar (old change), no masses or tenderness, No nipple retraction or dimpling, No nipple discharge or bleeding, No axillary or supraclavicular adenopathy Heart: regular rate and rhythm Abdomen: soft, non-tender; no masses, no organomegaly Extremities: extremities normal, atraumatic, no cyanosis or edema Skin: Skin color, texture, turgor normal. No rashes or lesions Lymph nodes: Cervical, supraclavicular, and axillary nodes normal. No abnormal inguinal nodes palpated Neurologic: Grossly normal  Pelvic: External  genitalia:  no lesions              Urethra:  normal appearing urethra with no masses, tenderness or lesions                Bartholins and Skenes: normal                 Vagina: normal appearing vagina with normal color and discharge, no lesions              Cervix: no lesions              Pap taken: Yes.   Bimanual Exam:  Uterus:  normal size, contour, position, consistency, mobility, non-tender              Adnexa: no mass, fullness, tenderness              Rectal exam: Yes.  .  Confirms.              Anus:  normal sphincter tone, no lesions  Chaperone was present for exam.  Assessment:   Well woman visit with normal exam. FH breast cancer.  Personal hx of fibroglandular breasts. Status post bilateral breast surgeries.  Increased lifetime risk of breast cancer.  Intolerant of Tamoxifen due to severe constipation.  Negative genetic screening.  IDDM.  Osteoporosis.  On Prolia.  Care through Dr. South.  Hx eosinophilia.  Mild GSI.   Plan: Mammogram screening discussed. Do 3D. Yearly breast MRI.  Recommended self breast awareness. Pap and HR HPV as above. Guidelines for Calcium, Vitamin D, regular exercise program including cardiovascular and weight bearing exercise. Seeing oncology yearly.  Kegel's.  I also briefly discussed pelvic floor PT and midurethral sling.  Follow up annually and prn.    After visit summary provided.     

## 2017-09-23 LAB — CYTOLOGY - PAP
Diagnosis: NEGATIVE
HPV: NOT DETECTED

## 2017-10-03 DIAGNOSIS — M542 Cervicalgia: Secondary | ICD-10-CM | POA: Diagnosis not present

## 2017-10-03 DIAGNOSIS — M791 Myalgia, unspecified site: Secondary | ICD-10-CM | POA: Diagnosis not present

## 2017-10-03 DIAGNOSIS — G43719 Chronic migraine without aura, intractable, without status migrainosus: Secondary | ICD-10-CM | POA: Diagnosis not present

## 2017-10-03 DIAGNOSIS — G518 Other disorders of facial nerve: Secondary | ICD-10-CM | POA: Diagnosis not present

## 2017-10-07 DIAGNOSIS — E1040 Type 1 diabetes mellitus with diabetic neuropathy, unspecified: Secondary | ICD-10-CM | POA: Diagnosis not present

## 2017-10-10 ENCOUNTER — Telehealth: Payer: Self-pay | Admitting: Rheumatology

## 2017-10-10 NOTE — Telephone Encounter (Signed)
Patient called stating that she received a call from [facility] [employer] who said they needed a new prescription for her Enbrel.

## 2017-10-11 NOTE — Telephone Encounter (Signed)
Last Visit: 06/08/17 Next Visit 11/09/17 Labs: 08/15/17 WNL Tb Gold: 04/06/17 Neg   Okay to refill per Dr. Corliss Skains  Prescription faxed to [facility]

## 2017-10-12 DIAGNOSIS — Z794 Long term (current) use of insulin: Secondary | ICD-10-CM | POA: Diagnosis not present

## 2017-10-12 DIAGNOSIS — E109 Type 1 diabetes mellitus without complications: Secondary | ICD-10-CM | POA: Diagnosis not present

## 2017-10-13 DIAGNOSIS — Z794 Long term (current) use of insulin: Secondary | ICD-10-CM | POA: Diagnosis not present

## 2017-10-13 DIAGNOSIS — E109 Type 1 diabetes mellitus without complications: Secondary | ICD-10-CM | POA: Diagnosis not present

## 2017-10-28 NOTE — Progress Notes (Signed)
Office Visit Note  [name]             Date of Birth: [birth year]           MRN: [id number]             PCP: Adrian Prince, MD Referring: Adrian Prince, MD Visit Date: 11/09/2017 Occupation: @GUAROCC @    Subjective:  Elbow pain    History of Present Illness: Becky [name] is a 62 y.o. female with history of seropositive rheumatoid arthritis, Sjogren's, osteoarthritis, and DDD.  Patient continues to inject Enbrel 50 mg every week and MTX 0.8 ml once weekly, and folic acid 2 mg daily.  She has been having increased pain and stiffness in bilateral elbow joints.  She denies any elbow swelling.  She has pain with ROM of elbow joints. She reports increased swelling in bilateral hands but denies any hand pain.  She states she has been unable to wear some of her rings.  She denies any feet pain or swelling.  She continues to have chronic pain in knee joints.  She denies any knee swelling.  She denies any shoulder pain.  She has chronic lower back pain and some stiffness in neck.  She continues to have dry mouth and dry eyes.   Activities of Daily Living:  Patient reports morning stiffness for 1 hour.   Patient Denies nocturnal pain.  Difficulty dressing/grooming: Denies Difficulty climbing stairs: Reports Difficulty getting out of chair: Reports Difficulty using hands for taps, buttons, cutlery, and/or writing: Reports   Review of Systems  Constitutional: Positive for fatigue.  HENT: Positive for mouth dryness. Negative for mouth sores and nose dryness.   Eyes: Positive for redness, itching and dryness. Negative for pain and visual disturbance.  Respiratory: Negative for cough, hemoptysis, shortness of breath and difficulty breathing.   Cardiovascular: Positive for swelling in legs/feet. Negative for chest pain, palpitations and hypertension.  Gastrointestinal: Negative for blood in stool, constipation and diarrhea.  Endocrine: Negative for increased urination.    Genitourinary: Negative for difficulty urinating and painful urination.  Musculoskeletal: Positive for arthralgias, joint pain, joint swelling and morning stiffness. Negative for myalgias, muscle weakness, muscle tenderness and myalgias.  Skin: Negative for color change, pallor, rash, hair loss, nodules/bumps, skin tightness, ulcers and sensitivity to sunlight.  Allergic/Immunologic: Negative for susceptible to infections.  Neurological: Negative for dizziness, headaches and weakness.  Hematological: Negative for bruising/bleeding tendency and swollen glands.  Psychiatric/Behavioral: Positive for sleep disturbance. Negative for depressed mood. The patient is not nervous/anxious.     PMFS History:  Patient Active Problem List   Diagnosis Date Noted  . Eosinophilia 01/31/2017  . Iritis 01/04/2017  . History of joint swelling 08/24/2016  . Osteoarthritis of knees, bilateral 08/24/2016  . History of migraine 08/24/2016  . History of depression 08/24/2016  . History of osteoporosis 08/24/2016  . DDD (degenerative disc disease), lumbar 08/24/2016  . High risk medication use 08/24/2016  . Trigger finger, left middle finger 08/24/2016  . Trigger finger, right ring finger 08/24/2016  . 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 Increased risk of breast cancer 07/06/2015  . IDDM (insulin dependent diabetes mellitus) (HCC) 07/06/2015  . 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 Sjogren's syndrome (HCC) 07/06/2015  . Genetic testing 05/12/2015  . Family history of breast cancer 04/15/2015  . Fibrocystic breast disease 10/11/2012  . GERD 10/01/2009  . Chest pain with moderate risk for cardiac etiology  09/02/2009  . DYSPHAGIA 09/02/2009  . DIABETES MELLITUS-TYPE II 08/15/2009  . ANXIETY 08/15/2009  . ULCER-GASTRIC 08/15/2009  . CONSTIPATION 08/15/2009  . SLOW TRANSIT CONSTIPATION 08/15/2009  . FLATULENCE ERUCTATION AND  GAS PAIN 08/15/2009    Past Medical History:  Diagnosis Date  . Abnormal ECG   . Anemia   . Anxiety   . Cataract    . Cateracts bil eyes removed  . GERD (gastroesophageal reflux disease)   . Heart murmur   . Hypercholesterolemia   . IDDM (insulin dependent diabetes mellitus) (HCC) 1969  . Migraine headache   . Neuromuscular disorder (HCC)    bil legs & feet - neuropathy  . Neuropathy   . Osteoporosis    10/2012  . Otosclerosis   . RA (rheumatoid arthritis) (HCC) 2000  . S/P cardiac cath August 2013   Normal coronaries and normal LV function.  . Sjoegren syndrome     Family History  Problem Relation Age of Onset  . Breast cancer Mother 37  . Colon polyps Mother        unspecified number  . Breast cancer Maternal Grandmother 17  . Stroke Father 85  . Rheum arthritis Sister   . Breast cancer Sister 48       lump; (-)GT (breast/ova ca panel, GeneDx)  . Skin cancer Maternal Grandfather        unspecified type  . Prostate cancer Maternal Grandfather        dx. late 108s  . Breast cancer Other        dx. in their late 25s  . Breast cancer Other        dx. 78-79  . Breast cancer Cousin        bilateral; dx. late 2s  . Colon cancer Neg Hx   . Esophageal cancer Neg Hx   . Rectal cancer Neg Hx   . Stomach cancer Neg Hx    Past Surgical History:  Procedure Laterality Date  . BREAST LUMPECTOMY  1995  . CARPAL TUNNEL RELEASE  2000  . CATARACT EXTRACTION Right 09-05-12  . CATARACT EXTRACTION Left 3/15  . CESAREAN SECTION  1987  . COLONOSCOPY    . EYE SURGERY     diabetic retinopathy   . HYSTEROSCOPY  02/2001   and D & C  . LYMPH NODE DISSECTION  2002  . TOE SURGERY  2000   bone removed   Social History   Social History Narrative  . Not on file     Objective: Vital Signs: BP (!) 86/51 (BP Location: Left Arm, Patient Position: Sitting, Cuff Size: Normal)   Pulse 89   Resp 14   Ht 5\' 4"  (1.626 m)   Wt 132 lb (59.9 kg)   LMP 09/19/2008   BMI 22.66 kg/m     Physical Exam  Constitutional: She is oriented to person, place, and time. She appears well-developed and well-nourished.  HENT:  Head: Normocephalic and atraumatic.  Eyes: Conjunctivae and EOM are normal.  Neck: Normal range of motion.  Cardiovascular: Normal rate, regular rhythm, normal heart sounds and intact distal pulses.  Pulmonary/Chest: Effort normal and breath sounds normal.  Abdominal: Soft. Bowel sounds are normal.  Lymphadenopathy:    She has no cervical adenopathy.  Neurological: She is alert and oriented to person, place, and time.  Skin: Skin is warm and dry. Capillary refill takes less than 2 seconds.  Psychiatric: She has a normal mood and affect. Her behavior is normal.  Nursing note  and vitals reviewed.    Musculoskeletal Exam: C-spine, thoracic spine, lumbar spine good ROM.  Shoulder joints, elbow joints, wrist joints, MCPs, PIPs, and DIPs good ROM with no synovitis.  PIP and DIP synovial thickening consistent with osteoarthritis. She has some discomfort with extension of bilateral elbow joints.  No synovitis of elbow joints. Hip joints, knee joints, ankle joints, MTPs, PIPs, and DIPs good ROM with no synovitis.  No warmth or effusion of knee joints.    CDAI Exam: No CDAI exam completed.    Investigation: No additional findings.TB gold: 04/06/2017 Negative  CBC Latest Ref Rng & Units 08/15/2017 04/06/2017 01/05/2017  WBC 3.8 - 10.8 Thousand/uL 5.6 4.6 5.2  Hemoglobin 11.7 - 15.5 g/dL 31.5 40.0 86.7  Hematocrit 35.0 - 45.0 % 38.7 40.2 41.9  Platelets 140 - 400 Thousand/uL 277 268 262   CMP Latest Ref Rng & Units 08/15/2017 04/06/2017 01/05/2017  Glucose 65 - 99 mg/dL 90 619(J) 95  BUN 7 - 25 mg/dL 18 15 09.3  Creatinine 0.50 - 0.99 mg/dL 2.67 1.24 1.0  Sodium 580 - 146 mmol/L 143 144 142  Potassium 3.5 - 5.3 mmol/L 4.9 5.8(H) 4.3  Chloride 98 - 110 mmol/L 107 108 -  CO2 20 - 32 mmol/L 30 29 24   Calcium 8.6 - 10.4 mg/dL 9.9 99.8 9.7  Total Protein 6.1 - 8.1  g/dL 6.8 7.1 7.1  Total Bilirubin 0.2 - 1.2 mg/dL 0.4 0.6 3.38  Alkaline Phos 40 - 150 U/L - - 67  AST 10 - 35 U/L 20 27 24   ALT 6 - 29 U/L 18 22 18     Imaging: No results found.  Speciality Comments: No specialty comments available.    Procedures:  No procedures performed Allergies: Ciprofloxacin and Penicillins   Assessment / Plan:     Visit Diagnoses: 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 without organ or systems involvement (HCC) -She has no active synovitis on exam today.  She has been having increased discomfort in her bilateral elbows which is likely due to tendinitis.  She has been having increased swelling in her bilateral hands but has no synovitis on exam.  She has PIP and DIP synovial thickening consistent with osteoarthritis of bilateral hands.  Joint protection and muscle strengthening were discussed.  She will continue injecting Enbrel 50 mg subcutaneously every week and injecting methotrexate 0.8 mL once weekly and taking folic acid 2 mg daily.  Plan: Rheumatoid factor  Iritis: She has not had any recent flares of iritis.  Sjogren's syndrome with keratoconjunctivitis sicca (HCC) -She continues to have sicca symptoms.  She has no parotid swelling on exam.  CBC, CMP, SPEP, RF, and UA will be checked today for monitoring.  Plan: Urinalysis, Routine w reflex microscopic, Rheumatoid factor, Serum protein electrophoresis with reflex  High risk medication use - Enbrel 50 mg by mouth every week, methotrexate 0.8 ML subcutaneous every week, folic acid 2 mg by mouth daily(Orencia -discontinued due to the cost) -CBC and CMP will be checked today to monitor for drug toxicity.  TB gold will be due in October 2019.  Plan: CBC with Differential/Platelet, COMPLETE METABOLIC PANEL WITH GFR  Primary osteoarthritis of both knees: No warmth or effusion of knee joints.  She does have discomfort in her knee joints.  She states she will have occasional swelling in her  right knee if she standing for prolonged periods of time.  DDD (degenerative disc disease), lumbar: Chronic pain.  She has no midline spinal tenderness.  History of osteoporosis - on Prolia per Dr. Evlyn Kanner.  She continues to take vitamin D and calcium on a daily basis.  Her last vitamin D was checked on 08/15/2017 and it was 47.  We discussed the importance of resistive exercises.  Other medical conditions are listed as follows:  History of migraine  History of diabetes mellitus  History of depression - Better on Cymbalta  History of gastroesophageal reflux (GERD)  Increased risk of breast cancer  Other fatigue -She has been having increased fatigue and would like her TSH checked today.  Plan: TSH    Orders: Orders Placed This Encounter  Procedures  . CBC with Differential/Platelet  . COMPLETE METABOLIC PANEL WITH GFR  . Urinalysis, Routine w reflex microscopic  . Rheumatoid factor  . Serum protein electrophoresis with reflex  . TSH   No orders of the defined types were placed in this encounter.   Face-to-face time spent with patient was 30 minutes. >50% of time was spent in counseling and coordination of care.  Follow-Up Instructions: Return in about 5 months (around 04/11/2018) for Rheumatoid arthritis, Sjogren's syndrome, Osteoarthritis.   Gearldine Bienenstock, PA-C I examined and evaluated the patient with Sherron Ales PA.  Patient had no synovitis on my examination today.  The plan of care was discussed as noted above.  I examined and evaluated the patient with Sherron Ales PA. The plan of care was discussed as noted above.  Pollyann Savoy, MD Note - This record has been created using Animal nutritionist.  Chart creation errors have been sought, but may not always  have been located. Such creation errors do not reflect on  the standard of medical care.

## 2017-11-05 DIAGNOSIS — E1065 Type 1 diabetes mellitus with hyperglycemia: Secondary | ICD-10-CM | POA: Diagnosis not present

## 2017-11-09 ENCOUNTER — Encounter: Payer: Self-pay | Admitting: Rheumatology

## 2017-11-09 ENCOUNTER — Ambulatory Visit: Payer: Medicare Other | Admitting: Rheumatology

## 2017-11-09 VITALS — BP 86/51 | HR 89 | Resp 14 | Ht 64.0 in | Wt 132.0 lb

## 2017-11-09 DIAGNOSIS — Z8719 Personal history of other diseases of the digestive system: Secondary | ICD-10-CM | POA: Diagnosis not present

## 2017-11-09 DIAGNOSIS — E1040 Type 1 diabetes mellitus with diabetic neuropathy, unspecified: Secondary | ICD-10-CM | POA: Diagnosis not present

## 2017-11-09 DIAGNOSIS — Z794 Long term (current) use of insulin: Secondary | ICD-10-CM | POA: Diagnosis not present

## 2017-11-09 DIAGNOSIS — R5383 Other fatigue: Secondary | ICD-10-CM | POA: Diagnosis not present

## 2017-11-09 DIAGNOSIS — Z8739 Personal history of other diseases of the musculoskeletal system and connective tissue: Secondary | ICD-10-CM | POA: Diagnosis not present

## 2017-11-09 DIAGNOSIS — M3501 Sicca syndrome with keratoconjunctivitis: Secondary | ICD-10-CM

## 2017-11-09 DIAGNOSIS — Z8639 Personal history of other endocrine, nutritional and metabolic disease: Secondary | ICD-10-CM

## 2017-11-09 DIAGNOSIS — M0579 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 without organ or systems involvement: Secondary | ICD-10-CM | POA: Diagnosis not present

## 2017-11-09 DIAGNOSIS — Z8659 Personal history of other mental and behavioral disorders: Secondary | ICD-10-CM | POA: Diagnosis not present

## 2017-11-09 DIAGNOSIS — Z9189 Other specified personal risk factors, not elsewhere classified: Secondary | ICD-10-CM | POA: Diagnosis not present

## 2017-11-09 DIAGNOSIS — Z79899 Other long term (current) drug therapy: Secondary | ICD-10-CM

## 2017-11-09 DIAGNOSIS — N183 Chronic kidney disease, stage 3 (moderate): Secondary | ICD-10-CM | POA: Diagnosis not present

## 2017-11-09 DIAGNOSIS — M5136 Other intervertebral disc degeneration, lumbar region: Secondary | ICD-10-CM | POA: Diagnosis not present

## 2017-11-09 DIAGNOSIS — E113593 Type 2 diabetes mellitus with proliferative diabetic retinopathy without macular edema, bilateral: Secondary | ICD-10-CM | POA: Diagnosis not present

## 2017-11-09 DIAGNOSIS — Z8669 Personal history of other diseases of the nervous system and sense organs: Secondary | ICD-10-CM | POA: Diagnosis not present

## 2017-11-09 DIAGNOSIS — M170 Bilateral primary osteoarthritis of knee: Secondary | ICD-10-CM

## 2017-11-09 DIAGNOSIS — H209 Unspecified iridocyclitis: Secondary | ICD-10-CM

## 2017-11-09 NOTE — Patient Instructions (Addendum)
Knee Exercises Ask your health care provider which exercises are safe for you. Do exercises exactly as told by your health care provider and adjust them as directed. It is normal to feel mild stretching, pulling, tightness, or discomfort as you do these exercises, but you should stop right away if you feel sudden pain or your pain gets worse.Do not begin these exercises until told by your health care provider. STRETCHING AND RANGE OF MOTION EXERCISES These exercises warm up your muscles and joints and improve the movement and flexibility of your knee. These exercises also help to relieve pain, numbness, and tingling. Exercise A: Knee Extension, Prone 1. Lie on your abdomen on a bed. 2. Place your left / right knee just beyond the edge of the surface so your knee is not on the bed. You can put a towel under your left / right thigh just above your knee for comfort. 3. Relax your leg muscles and allow gravity to straighten your knee. You should feel a stretch behind your left / right knee. 4. Hold this position for __________ seconds. 5. Scoot up so your knee is supported between repetitions. Repeat __________ times. Complete this stretch __________ times a day. Exercise B: Knee Flexion, Active  1. Lie on your back with both knees straight. If this causes back discomfort, bend your left / right knee so your foot is flat on the floor. 2. Slowly slide your left / right heel back toward your buttocks until you feel a gentle stretch in the front of your knee or thigh. 3. Hold this position for __________ seconds. 4. Slowly slide your left / right heel back to the starting position. Repeat __________ times. Complete this exercise __________ times a day. Exercise C: Quadriceps, Prone  1. Lie on your abdomen on a firm surface, such as a bed or padded floor. 2. Bend your left / right knee and hold your ankle. If you cannot reach your ankle or pant leg, loop a belt around your foot and grab the belt  instead. 3. Gently pull your heel toward your buttocks. Your knee should not slide out to the side. You should feel a stretch in the front of your thigh and knee. 4. Hold this position for __________ seconds. Repeat __________ times. Complete this stretch __________ times a day. Exercise D: Hamstring, Supine 1. Lie on your back. 2. Loop a belt or towel over the ball of your left / right foot. The ball of your foot is on the walking surface, right under your toes. 3. Straighten your left / right knee and slowly pull on the belt to raise your leg until you feel a gentle stretch behind your knee. ? Do not let your left / right knee bend while you do this. ? Keep your other leg flat on the floor. 4. Hold this position for __________ seconds. Repeat __________ times. Complete this stretch __________ times a day. STRENGTHENING EXERCISES These exercises build strength and endurance in your knee. Endurance is the ability to use your muscles for a long time, even after they get tired. Exercise E: Quadriceps, Isometric  1. Lie on your back with your left / right leg extended and your other knee bent. Put a rolled towel or small pillow under your knee if told by your health care provider. 2. Slowly tense the muscles in the front of your left / right thigh. You should see your kneecap slide up toward your hip or see increased dimpling just above the knee. This   motion will push the back of the knee toward the floor. 3. For __________ seconds, keep the muscle as tight as you can without increasing your pain. 4. Relax the muscles slowly and completely. Repeat __________ times. Complete this exercise __________ times a day. Exercise F: Straight Leg Raises - Quadriceps 1. Lie on your back with your left / right leg extended and your other knee bent. 2. Tense the muscles in the front of your left / right thigh. You should see your kneecap slide up or see increased dimpling just above the knee. Your thigh may  even shake a bit. 3. Keep these muscles tight as you raise your leg 4-6 inches (10-15 cm) off the floor. Do not let your knee bend. 4. Hold this position for __________ seconds. 5. Keep these muscles tense as you lower your leg. 6. Relax your muscles slowly and completely after each repetition. Repeat __________ times. Complete this exercise __________ times a day. Exercise G: Hamstring, Isometric 1. Lie on your back on a firm surface. 2. Bend your left / right knee approximately __________ degrees. 3. Dig your left / right heel into the surface as if you are trying to pull it toward your buttocks. Tighten the muscles in the back of your thighs to dig as hard as you can without increasing any pain. 4. Hold this position for __________ seconds. 5. Release the tension gradually and allow your muscles to relax completely for __________ seconds after each repetition. Repeat __________ times. Complete this exercise __________ times a day. Exercise H: Hamstring Curls  If told by your health care provider, do this exercise while wearing ankle weights. Begin with __________ weights. Then increase the weight by 1 lb (0.5 kg) increments. Do not wear ankle weights that are more than __________. 1. Lie on your abdomen with your legs straight. 2. Bend your left / right knee as far as you can without feeling pain. Keep your hips flat against the floor. 3. Hold this position for __________ seconds. 4. Slowly lower your leg to the starting position.  Repeat __________ times. Complete this exercise __________ times a day. Exercise I: Squats (Quadriceps) 1. Stand in front of a table, with your feet and knees pointing straight ahead. You may rest your hands on the table for balance but not for support. 2. Slowly bend your knees and lower your hips like you are going to sit in a chair. ? Keep your weight over your heels, not over your toes. ? Keep your lower legs upright so they are parallel with the table  legs. ? Do not let your hips go lower than your knees. ? Do not bend lower than told by your health care provider. ? If your knee pain increases, do not bend as low. 3. Hold the squat position for __________ seconds. 4. Slowly push with your legs to return to standing. Do not use your hands to pull yourself to standing. Repeat __________ times. Complete this exercise __________ times a day. Exercise J: Wall Slides (Quadriceps)  1. Lean your back against a smooth wall or door while you walk your feet out 18-24 inches (46-61 cm) from it. 2. Place your feet hip-width apart. 3. Slowly slide down the wall or door until your knees bend __________ degrees. Keep your knees over your heels, not over your toes. Keep your knees in line with your hips. 4. Hold for __________ seconds. Repeat __________ times. Complete this exercise __________ times a day. Exercise K: Straight Leg Raises -   Hip Abductors 1. Lie on your side with your left / right leg in the top position. Lie so your head, shoulder, knee, and hip line up. You may bend your bottom knee to help you keep your balance. 2. Roll your hips slightly forward so your hips are stacked directly over each other and your left / right knee is facing forward. 3. Leading with your heel, lift your top leg 4-6 inches (10-15 cm). You should feel the muscles in your outer hip lifting. ? Do not let your foot drift forward. ? Do not let your knee roll toward the ceiling. 4. Hold this position for __________ seconds. 5. Slowly return your leg to the starting position. 6. Let your muscles relax completely after each repetition. Repeat __________ times. Complete this exercise __________ times a day. Exercise L: Straight Leg Raises - Hip Extensors 1. Lie on your abdomen on a firm surface. You can put a pillow under your hips if that is more comfortable. 2. Tense the muscles in your buttocks and lift your left / right leg about 4-6 inches (10-15 cm). Keep your knee  straight as you lift your leg. 3. Hold this position for __________ seconds. 4. Slowly lower your leg to the starting position. 5. Let your leg relax completely after each repetition. Repeat __________ times. Complete this exercise __________ times a day. This information is not intended to replace advice given to you by your health care provider. Make sure you discuss any questions you have with your health care provider. Document Released: 04/21/2005 Document Revised: 03/01/2016 Document Reviewed: 04/13/2015 Elsevier Interactive Patient Education  2018 [employer]  Standing Labs We placed an order today for your standing lab work.    Please come back and get your standing labs in August  and every 3 months. We have open lab Monday through Friday from 8:30-11:30 AM and 1:30-4:00 PM  at the office of Dr. Shaili Deveshwar.   You may experience shorter wait times on Monday and Friday afternoons. The office is located at [address], [address], [place], [place] [address] No appointment is necessary.   Labs are drawn by [facility].  You may receive a bill from [facility] for your lab work. If you have any questions regarding directions or hours of operation,  please call [phone].    

## 2017-11-11 LAB — CBC WITH DIFFERENTIAL/PLATELET
Basophils Absolute: 68 cells/uL (ref 0–200)
Basophils Relative: 1.3 %
Eosinophils Absolute: 359 cells/uL (ref 15–500)
Eosinophils Relative: 6.9 %
HCT: 38.8 % (ref 35.0–45.0)
Hemoglobin: 12.7 g/dL (ref 11.7–15.5)
Lymphs Abs: 1373 cells/uL (ref 850–3900)
MCH: 30.3 pg (ref 27.0–33.0)
MCHC: 32.7 g/dL (ref 32.0–36.0)
MCV: 92.6 fL (ref 80.0–100.0)
MPV: 12.8 fL — ABNORMAL HIGH (ref 7.5–12.5)
Monocytes Relative: 6.5 %
Neutro Abs: 3063 cells/uL (ref 1500–7800)
Neutrophils Relative %: 58.9 %
Platelets: 262 10*3/uL (ref 140–400)
RBC: 4.19 10*6/uL (ref 3.80–5.10)
RDW: 13.8 % (ref 11.0–15.0)
Total Lymphocyte: 26.4 %
WBC mixed population: 338 cells/uL (ref 200–950)
WBC: 5.2 10*3/uL (ref 3.8–10.8)

## 2017-11-11 LAB — URINALYSIS, ROUTINE W REFLEX MICROSCOPIC
Bilirubin Urine: NEGATIVE
Hgb urine dipstick: NEGATIVE
Ketones, ur: NEGATIVE
Leukocytes, UA: NEGATIVE
Nitrite: NEGATIVE
Protein, ur: NEGATIVE
Specific Gravity, Urine: 1.022 (ref 1.001–1.03)
pH: 6 (ref 5.0–8.0)

## 2017-11-11 LAB — COMPLETE METABOLIC PANEL WITH GFR
AG Ratio: 1.8 (calc) (ref 1.0–2.5)
ALT: 26 U/L (ref 6–29)
AST: 33 U/L (ref 10–35)
Albumin: 4.2 g/dL (ref 3.6–5.1)
Alkaline phosphatase (APISO): 40 U/L (ref 33–130)
BUN/Creatinine Ratio: 18 (calc) (ref 6–22)
BUN: 21 mg/dL (ref 7–25)
CO2: 28 mmol/L (ref 20–32)
Calcium: 9.3 mg/dL (ref 8.6–10.4)
Chloride: 103 mmol/L (ref 98–110)
Creat: 1.15 mg/dL — ABNORMAL HIGH (ref 0.50–0.99)
GFR, Est African American: 59 mL/min/{1.73_m2} — ABNORMAL LOW (ref >=60)
GFR, Est Non African American: 51 mL/min/{1.73_m2} — ABNORMAL LOW (ref >=60)
Globulin: 2.3 g/dL (calc) (ref 1.9–3.7)
Glucose, Bld: 257 mg/dL — ABNORMAL HIGH (ref 65–99)
Potassium: 4.2 mmol/L (ref 3.5–5.3)
Sodium: 138 mmol/L (ref 135–146)
Total Bilirubin: 0.5 mg/dL (ref 0.2–1.2)
Total Protein: 6.5 g/dL (ref 6.1–8.1)

## 2017-11-11 LAB — PROTEIN ELECTROPHORESIS, SERUM, WITH REFLEX
Albumin ELP: 4.1 g/dL (ref 3.8–4.8)
Alpha 1: 0.2 g/dL (ref 0.2–0.3)
Alpha 2: 0.7 g/dL (ref 0.5–0.9)
Beta 2: 0.3 g/dL (ref 0.2–0.5)
Beta Globulin: 0.4 g/dL (ref 0.4–0.6)
Gamma Globulin: 0.8 g/dL (ref 0.8–1.7)
Total Protein: 6.5 g/dL (ref 6.1–8.1)

## 2017-11-11 LAB — RHEUMATOID FACTOR: Rhuematoid fact SerPl-aCnc: 149 IU/mL — ABNORMAL HIGH (ref <14)

## 2017-11-11 LAB — TSH: TSH: 1.5 mIU/L (ref 0.40–4.50)

## 2017-11-11 NOTE — Progress Notes (Signed)
Glucose is 257 and she has 2+ glucose in urine.  Please forward results to PCP.  SPEP WNL. RF + but she has known RA  Creatinine is elevated at 1.15 and GFR is 51.  Please advise patient to lower MTX dose to 0.6 ml once weekly.  We will continue to monitor.

## 2017-11-15 DIAGNOSIS — G43719 Chronic migraine without aura, intractable, without status migrainosus: Secondary | ICD-10-CM | POA: Diagnosis not present

## 2017-11-15 DIAGNOSIS — M542 Cervicalgia: Secondary | ICD-10-CM | POA: Diagnosis not present

## 2017-11-15 DIAGNOSIS — M791 Myalgia, unspecified site: Secondary | ICD-10-CM | POA: Diagnosis not present

## 2017-11-15 DIAGNOSIS — G518 Other disorders of facial nerve: Secondary | ICD-10-CM | POA: Diagnosis not present

## 2017-11-17 DIAGNOSIS — Z1231 Encounter for screening mammogram for malignant neoplasm of breast: Secondary | ICD-10-CM | POA: Diagnosis not present

## 2017-11-17 DIAGNOSIS — Z803 Family history of malignant neoplasm of breast: Secondary | ICD-10-CM | POA: Diagnosis not present

## 2017-12-01 ENCOUNTER — Other Ambulatory Visit: Payer: Self-pay | Admitting: *Deleted

## 2017-12-01 MED ORDER — "TUBERCULIN-ALLERGY SYRINGES 27G X 1/2"" 1 ML KIT": 1.0000 | PACK | 3 refills | Status: DC

## 2017-12-01 NOTE — Telephone Encounter (Signed)
Refill request received via fax  Last Visit 11/11/17 Next Visit: 04/13/18  Okay to refill Dr. Corliss Skains

## 2017-12-02 ENCOUNTER — Telehealth: Payer: Self-pay | Admitting: Rheumatology

## 2017-12-02 MED ORDER — METHOTREXATE SODIUM CHEMO INJECTION 50 MG/2ML: INTRAMUSCULAR | 0 refills | Status: DC

## 2017-12-02 NOTE — Telephone Encounter (Signed)
Patient called requesting prescription refill of Methotrexate to be sent to [facility] in [facility].

## 2017-12-02 NOTE — Telephone Encounter (Signed)
Last Visit 11/11/17 Next Visit: 04/13/18 Labs: 11/09/17 Creatinine is elevated at 1.15 and GFR is 51  Okay to refill Dr. Corliss Skains

## 2017-12-03 DIAGNOSIS — E1065 Type 1 diabetes mellitus with hyperglycemia: Secondary | ICD-10-CM | POA: Diagnosis not present

## 2017-12-05 ENCOUNTER — Encounter: Payer: Self-pay | Admitting: Obstetrics and Gynecology

## 2017-12-09 DIAGNOSIS — E1040 Type 1 diabetes mellitus with diabetic neuropathy, unspecified: Secondary | ICD-10-CM | POA: Diagnosis not present

## 2017-12-21 ENCOUNTER — Telehealth: Payer: Self-pay | Admitting: Rheumatology

## 2017-12-21 NOTE — Telephone Encounter (Signed)
Patient needs last lab results faxed to [facility] for her Prolia injection.

## 2017-12-21 NOTE — Telephone Encounter (Signed)
Patient states she was on Forteo through her PCP for about 2 years. Patient states it was recommended after her last Dexa scan that she go on Prolia. Patient states she was given the option to go to the hospital and to [facility] to have the Prolia done. Patient did not realize that she could have that done here. Patient states that she would rather have that done hear. Patient advised that she would come sign a release of records. Patient states her next injection is due at the beginning of August.

## 2017-12-27 ENCOUNTER — Telehealth: Payer: Self-pay | Admitting: Rheumatology

## 2017-12-27 MED ORDER — METHOTREXATE SODIUM CHEMO INJECTION 50 MG/2ML: INTRAMUSCULAR | 0 refills | Status: DC

## 2017-12-27 NOTE — Telephone Encounter (Signed)
Last Visit 11/11/17 Next Visit: 04/13/18 Labs: 11/09/17 Creat 1.15 GFR 51 Elevated glucose  Okay to refill Dr. Corliss Skains

## 2017-12-27 NOTE — Telephone Encounter (Signed)
Patient called requesting prescription refill of Methotrexate and to check the status of her Prolia injection.

## 2017-12-28 ENCOUNTER — Other Ambulatory Visit: Payer: Self-pay | Admitting: *Deleted

## 2017-12-28 NOTE — Telephone Encounter (Signed)
Refill request received via [employer]  Last Visit 11/11/17 Next Visit: 04/13/18 Labs: 11/09/17 Creat 1.15 GFR 51 Elevated glucose TB Gold: 04/06/17 Neg   Okay to refill Dr. Corliss Skains  Prescription faxed to [facility]

## 2018-01-02 DIAGNOSIS — G43719 Chronic migraine without aura, intractable, without status migrainosus: Secondary | ICD-10-CM | POA: Diagnosis not present

## 2018-01-02 DIAGNOSIS — M791 Myalgia, unspecified site: Secondary | ICD-10-CM | POA: Diagnosis not present

## 2018-01-02 DIAGNOSIS — G518 Other disorders of facial nerve: Secondary | ICD-10-CM | POA: Diagnosis not present

## 2018-01-02 DIAGNOSIS — M542 Cervicalgia: Secondary | ICD-10-CM | POA: Diagnosis not present

## 2018-01-09 DIAGNOSIS — H04123 Dry eye syndrome of bilateral lacrimal glands: Secondary | ICD-10-CM | POA: Diagnosis not present

## 2018-01-09 DIAGNOSIS — Z961 Presence of intraocular lens: Secondary | ICD-10-CM | POA: Diagnosis not present

## 2018-01-10 DIAGNOSIS — E1040 Type 1 diabetes mellitus with diabetic neuropathy, unspecified: Secondary | ICD-10-CM | POA: Diagnosis not present

## 2018-01-10 DIAGNOSIS — E1065 Type 1 diabetes mellitus with hyperglycemia: Secondary | ICD-10-CM | POA: Diagnosis not present

## 2018-01-11 ENCOUNTER — Telehealth: Payer: Self-pay | Admitting: Pharmacy Technician

## 2018-01-11 NOTE — Telephone Encounter (Signed)
Received a Prior Authorization request from Cover my Meds for Enbrel. Authorization has been submitted to patient's insurance via Cover My Meds. Will update once we receive a response.  1:45 PM [name], CPhT

## 2018-01-12 ENCOUNTER — Other Ambulatory Visit: Payer: Self-pay | Admitting: *Deleted

## 2018-01-12 ENCOUNTER — Telehealth: Payer: Self-pay | Admitting: Pharmacy Technician

## 2018-01-12 MED ORDER — DENOSUMAB 60 MG/ML ~~LOC~~ SOSY: 60.0000 mg | PREFILLED_SYRINGE | SUBCUTANEOUS | 0 refills | Status: DC

## 2018-01-12 NOTE — Telephone Encounter (Signed)
Received a Prior Authorization request from [employer]Meds for Prolia. Authorization has been submitted to patient's insurance via Cover My Meds. Will update once we receive a response.  4:11 PM [name], CPhT

## 2018-01-12 NOTE — Telephone Encounter (Signed)
Working with Becky [name] to submit Prolia prior authorization via Cover my meds. Insurance in requiring patient's T-score, which testing was conducted by patient's PCP. Called patient, and she will call PCP to have bone density scan results sent over to our office. Gave patient our fax number. Will follow up.  2:23 PM [name], CPhT

## 2018-01-19 ENCOUNTER — Telehealth: Payer: Self-pay | Admitting: Pharmacy Technician

## 2018-01-19 NOTE — Telephone Encounter (Signed)
Received a fax regarding Prior Authorization for Prolia. Authorization has been DENIED because plan requires patient to have tried a bisphosphonate or a selective estrogen receptor modulator (SERM). Patient has tried [place], Enbrel, Methotrexate, Arava, and Forteo. Called plan and spoke to [facility], and confirmed denial reason. Put in request to have denial faxed over. Per Rep, they are no longer automatically sent over anymore.   Will send document to scan center once received.  Ref# [id number] Phone# [phone]  1:39 PM [name], CPhT

## 2018-01-19 NOTE — Telephone Encounter (Signed)
Working on an Electronics engineer for [employer], Spoke to patient and she will reach out to PCP to fax over supporting office notes and labs. Will follow up.  3:35 PM [name], CPhT

## 2018-01-19 NOTE — Telephone Encounter (Signed)
Patient called back saying her PCP was questioning why we were requesting a prior authorization for Prolia through patient's pharmacy benefit. Patient stated in the past, the other office would receive the medication and bill her injection as a medical procedure. She was advised to continue this route to avoid "messing up her pharmacy benefits." She stated she will prefer to stay at the other clinic if we cannot bill it that way.  I advised patient that I would send message to staff and we will reach back out to her to follow up.  4:33 PM. [name], CPhT

## 2018-01-19 NOTE — Telephone Encounter (Signed)
Received a notification from [facility] via Cover My Meds regarding a prior authorization for Enbrel. Authorization has been APPROVED from 01/11/2018 to 01/12/2019.   Will send document to scan center.  Authorization # [id number] Phone # [phone]  1:32 PM [name], CPhT

## 2018-01-20 NOTE — Telephone Encounter (Signed)
Received phone call from [facility] from [facility], who was checking to see if we wanted to appeal for Prolia. I let her know that we have reached out to our billing department to look into other billing options. She said we have 60 days from 12/13/17 to appeal or restart the process with new PA submittal. Currently waiting to hear back from billing department to see if can get medication and bill patient's medical benefit.  Phone#- [phone] Ref# [id number]   2:33 PM [name], CPhT

## 2018-01-20 NOTE — Telephone Encounter (Signed)
Are we able to do this? Please advise.

## 2018-01-24 NOTE — Telephone Encounter (Signed)
Dr. Corliss Skains is aware of the situation.

## 2018-01-24 NOTE — Telephone Encounter (Signed)
Received a call from patient stating that Dr. Rinaldo Cloud office told her that they will continue to administer her Prolia. Advised her that we were waiting on a response from our billing department to see if we could get medication and bill her medical benefit. Patient says she would prefer to continue at Dr. Rinaldo Cloud office. Patient had no other questions or concerns at this time.  2:16 PM [name], CPhT

## 2018-01-27 ENCOUNTER — Telehealth: Payer: Self-pay | Admitting: Oncology

## 2018-01-27 DIAGNOSIS — E109 Type 1 diabetes mellitus without complications: Secondary | ICD-10-CM | POA: Diagnosis not present

## 2018-01-27 DIAGNOSIS — Z794 Long term (current) use of insulin: Secondary | ICD-10-CM | POA: Diagnosis not present

## 2018-01-27 NOTE — Telephone Encounter (Signed)
Called pt re appts added per 8/9 sch msg - pt wanted to do lab on 8/13 and reschedule her f/u. She stated that she would call back once she had her calendar.

## 2018-01-30 DIAGNOSIS — E109 Type 1 diabetes mellitus without complications: Secondary | ICD-10-CM | POA: Diagnosis not present

## 2018-01-30 DIAGNOSIS — Z794 Long term (current) use of insulin: Secondary | ICD-10-CM | POA: Diagnosis not present

## 2018-01-31 ENCOUNTER — Telehealth: Payer: Self-pay | Admitting: Oncology

## 2018-01-31 ENCOUNTER — Inpatient Hospital Stay: Payer: Medicare Other

## 2018-01-31 ENCOUNTER — Ambulatory Visit: Payer: Medicare Other | Admitting: Oncology

## 2018-01-31 ENCOUNTER — Telehealth: Payer: Self-pay | Admitting: Hematology and Oncology

## 2018-01-31 NOTE — Telephone Encounter (Signed)
Returned call to patient re rescheduling 8/13 appointments due to out of town. Gave patient new appointments for [facility] 11/19 and lab 11/12 (date per pt).

## 2018-01-31 NOTE — Telephone Encounter (Signed)
Returned call to patient re rescheduling 9/19 appointment with [facility]. Not able to reach patient. Cancelled 9/19 f/u due to patient cannot come and left message for patient re cancellation and asking that patient return call office re rescheduling new f/u appointment.

## 2018-02-11 DIAGNOSIS — E1040 Type 1 diabetes mellitus with diabetic neuropathy, unspecified: Secondary | ICD-10-CM | POA: Diagnosis not present

## 2018-02-13 DIAGNOSIS — G518 Other disorders of facial nerve: Secondary | ICD-10-CM | POA: Diagnosis not present

## 2018-02-13 DIAGNOSIS — G43719 Chronic migraine without aura, intractable, without status migrainosus: Secondary | ICD-10-CM | POA: Diagnosis not present

## 2018-02-13 DIAGNOSIS — M542 Cervicalgia: Secondary | ICD-10-CM | POA: Diagnosis not present

## 2018-02-13 DIAGNOSIS — M791 Myalgia, unspecified site: Secondary | ICD-10-CM | POA: Diagnosis not present

## 2018-03-11 DIAGNOSIS — E1040 Type 1 diabetes mellitus with diabetic neuropathy, unspecified: Secondary | ICD-10-CM | POA: Diagnosis not present

## 2018-03-15 DIAGNOSIS — H43821 Vitreomacular adhesion, right eye: Secondary | ICD-10-CM | POA: Diagnosis not present

## 2018-03-15 DIAGNOSIS — H4321 Crystalline deposits in vitreous body, right eye: Secondary | ICD-10-CM | POA: Diagnosis not present

## 2018-03-15 DIAGNOSIS — E103551 Type 1 diabetes mellitus with stable proliferative diabetic retinopathy, right eye: Secondary | ICD-10-CM | POA: Diagnosis not present

## 2018-03-15 DIAGNOSIS — H43822 Vitreomacular adhesion, left eye: Secondary | ICD-10-CM | POA: Diagnosis not present

## 2018-03-23 IMAGING — DX DG CHEST 2V
2 series · 2 of 2 positions shown · non-contrast
Comparison: None.

CLINICAL DATA: Intermittent chest tightness for 1 month. Worsening
over the last 2 days.

EXAM:
CHEST  2 VIEW

[chest pa]
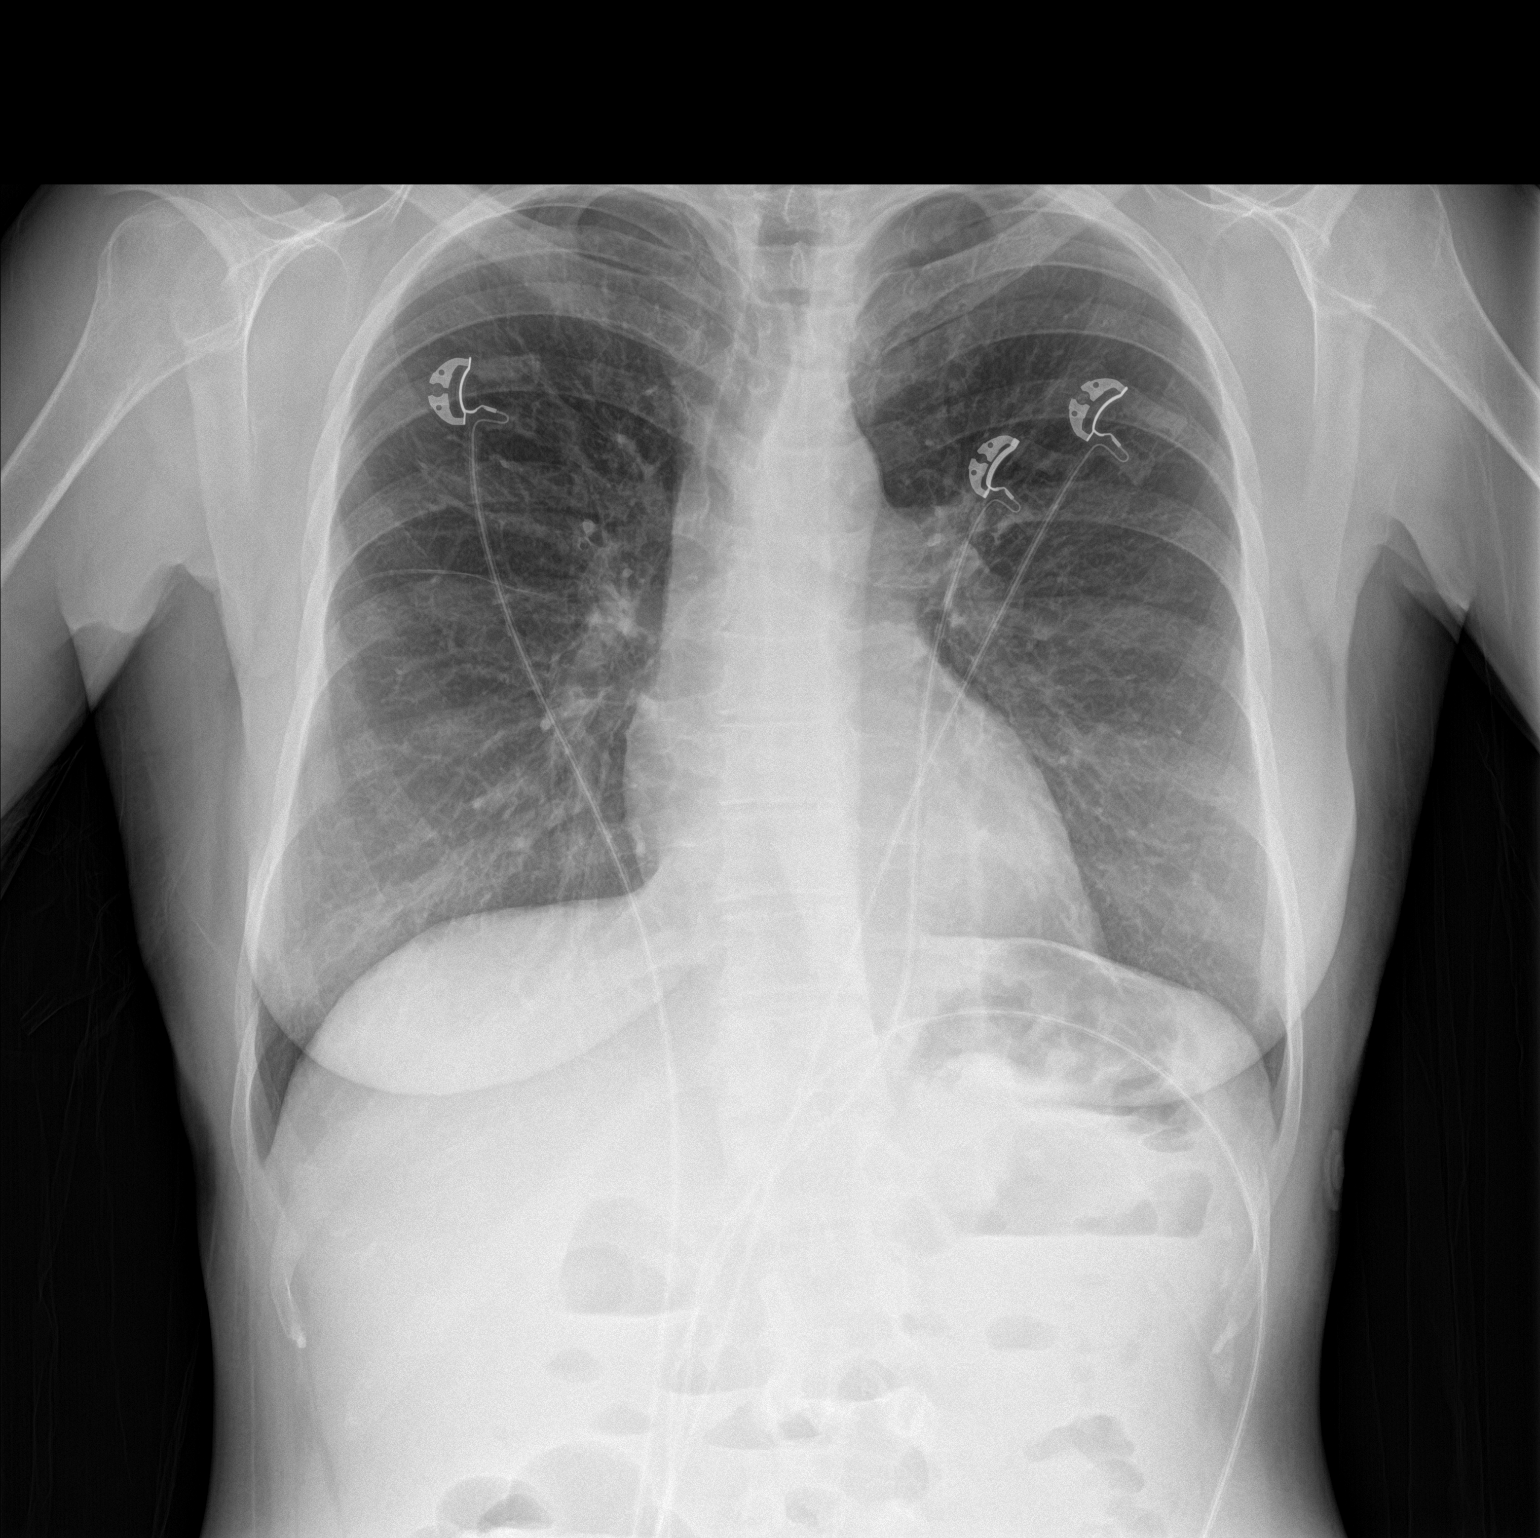

[chest lat]
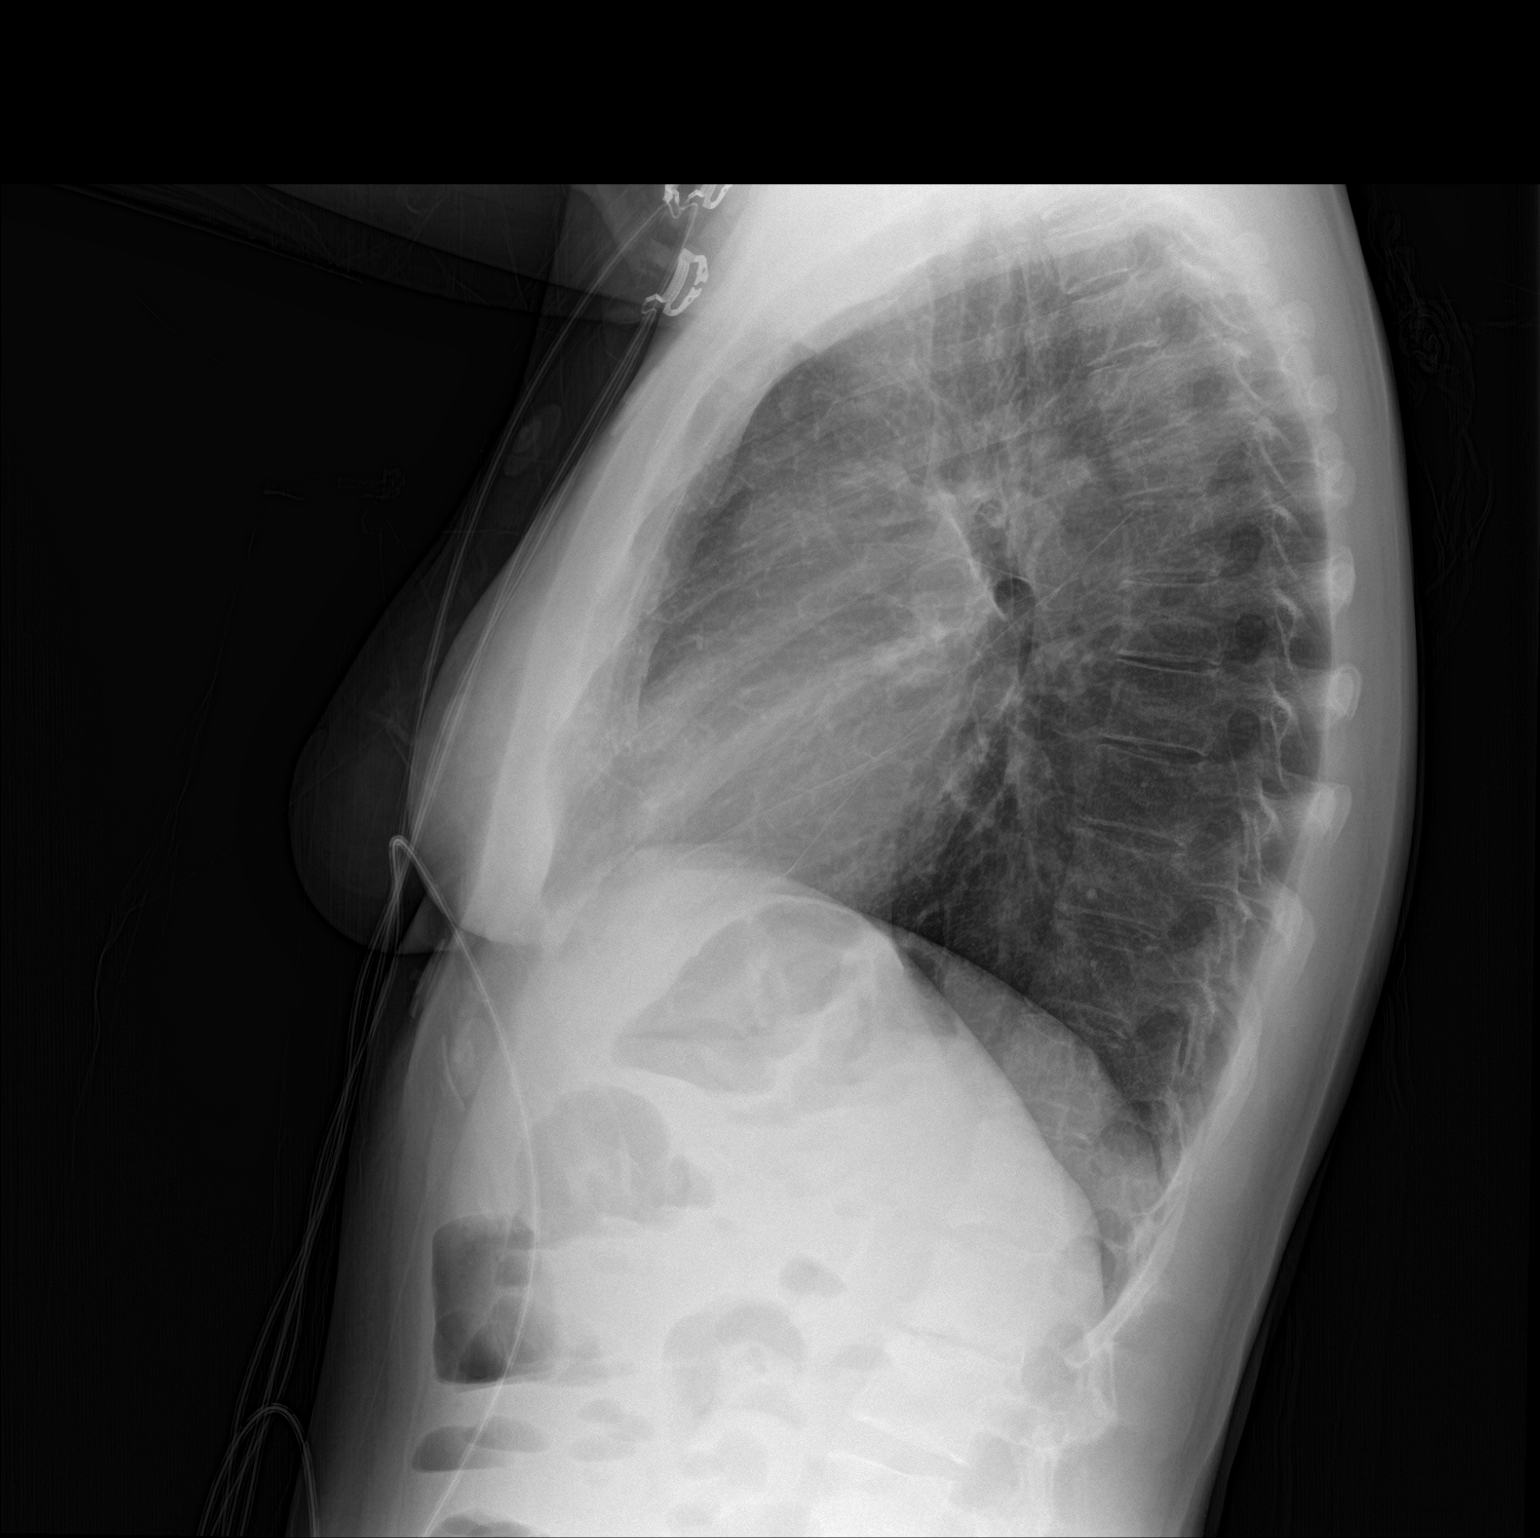

[2 of 2 positions shown; findings below may reference images not displayed]

FINDINGS: The lungs are clear wiithout focal pneumonia, edema, pneumothorax or
pleural effusion. The cardiopericardial silhouette is within normal
limits for size. The visualized bony structures of the thorax are
intact. Telemetry leads overlie the chest.
IMPRESSION: No active cardiopulmonary disease.

## 2018-03-23 IMAGING — CT CT ANGIO CHEST
2 of 8 series · 19 of 36 positions shown · IV contrast (isovue)
Comparison: None

CLINICAL DATA: Lateral chest pain for past month making a heart
attack 80 breath, pain since [REDACTED], history diabetes mellitus

EXAM:
CT ANGIOGRAPHY CHEST WITH CONTRAST
TECHNIQUE: Multidetector CT imaging of the chest was performed using the
standard protocol during bolus administration of intravenous
contrast. Multiplanar CT image reconstructions and MIPs were
obtained to evaluate the vascular anatomy.
CONTRAST:  100 cc Isovue 370 IV

[Series 6: pe thins · axial · 0.62mm/px · z∈[-284,-34]mm · 18 of 280 slices shown]
[im 15/280  lung]
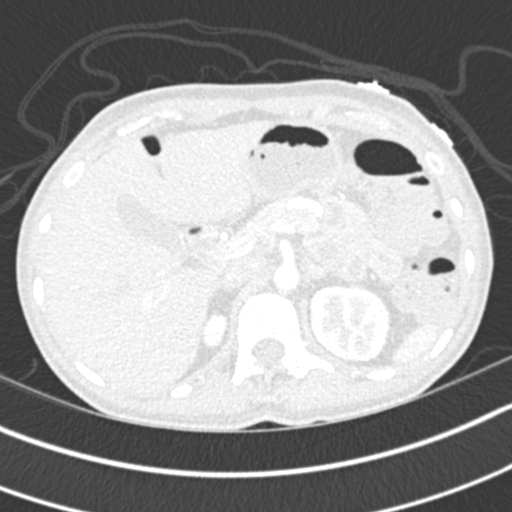
[im 30/280  mediastinal]
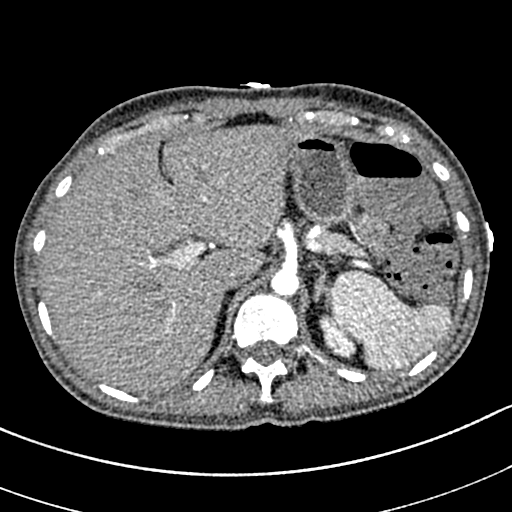
[im 45/280  lung]
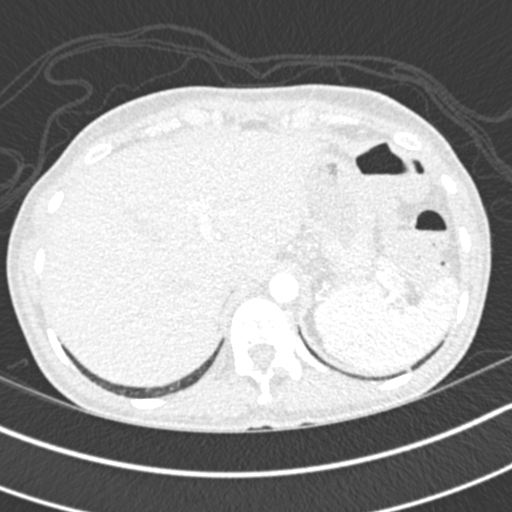
[im 59/280  mediastinal]
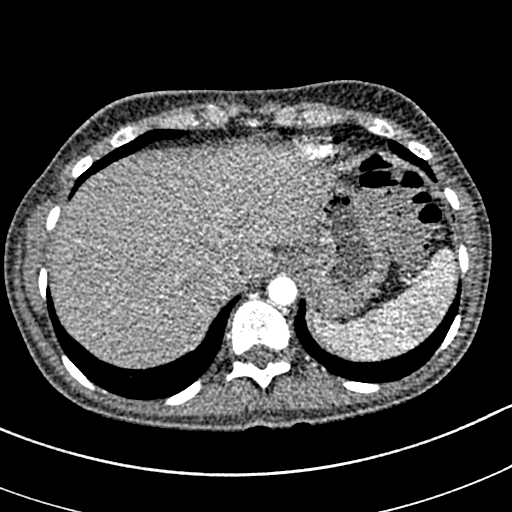
[im 74/280  lung]
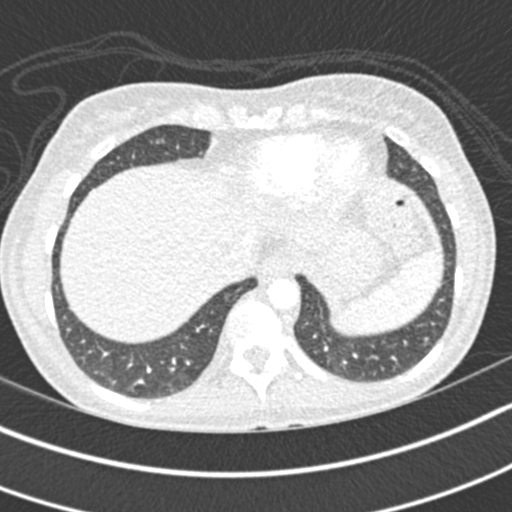
[im 89/280  mediastinal]
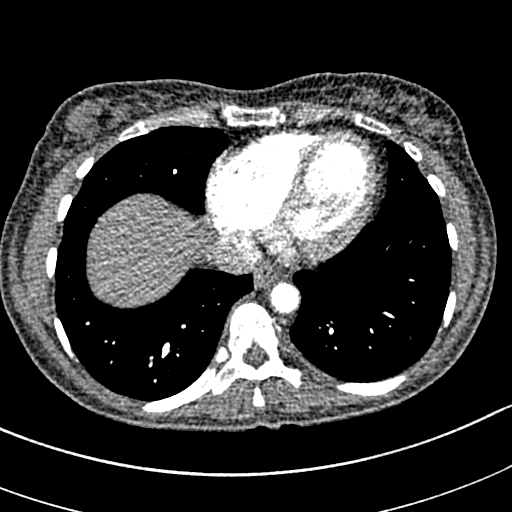
[im 103/280  lung]
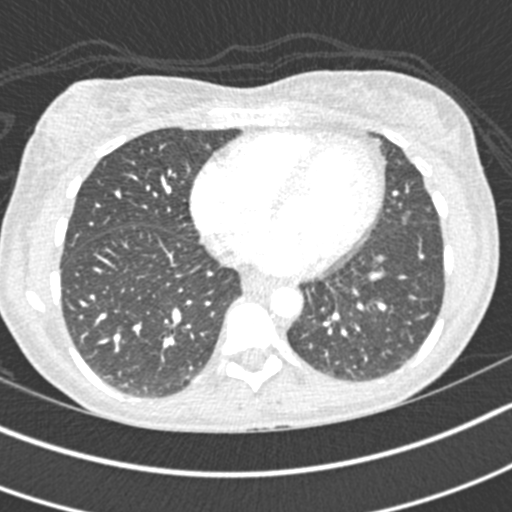
[im 118/280  mediastinal]
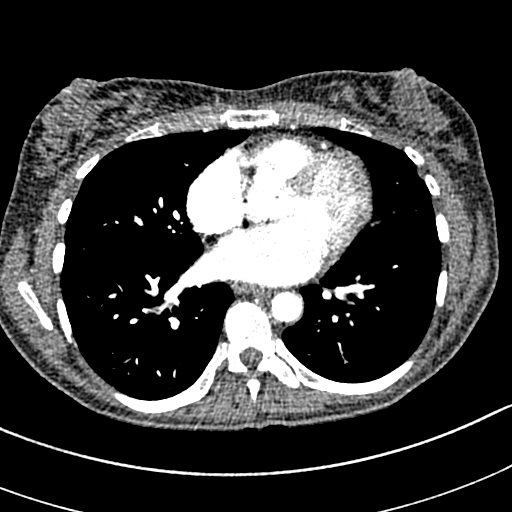
[im 133/280  lung]
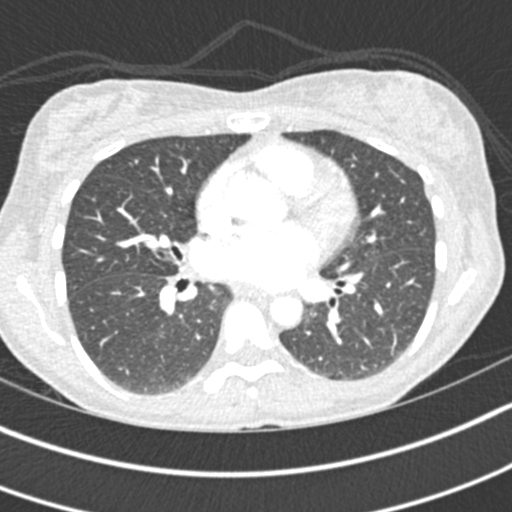
[im 147/280  mediastinal]
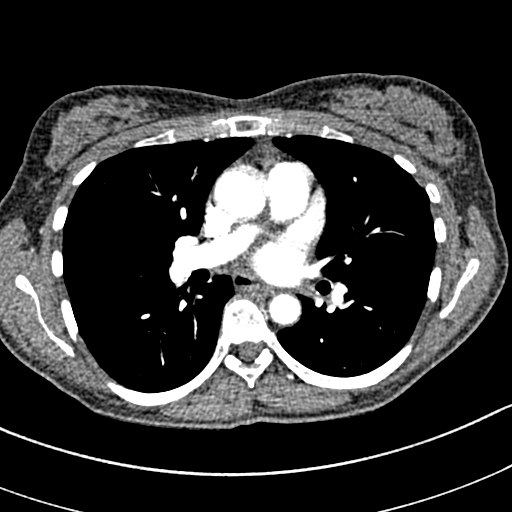
[im 162/280  lung]
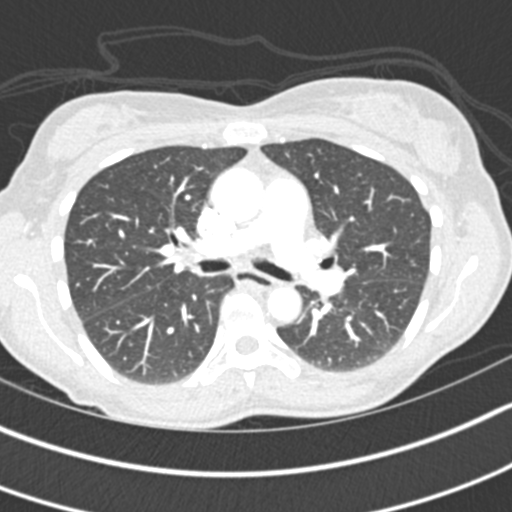
[im 177/280  mediastinal]
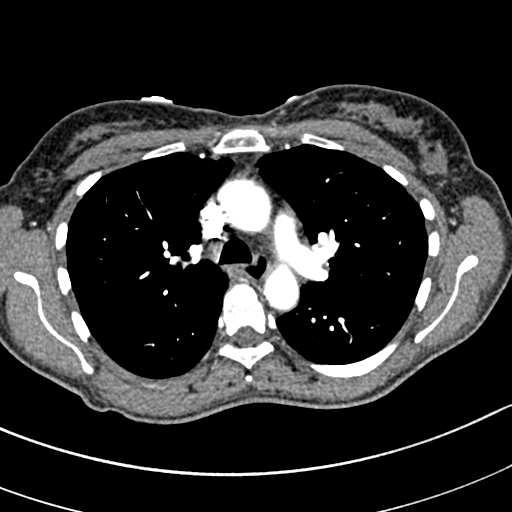
[im 191/280  lung]
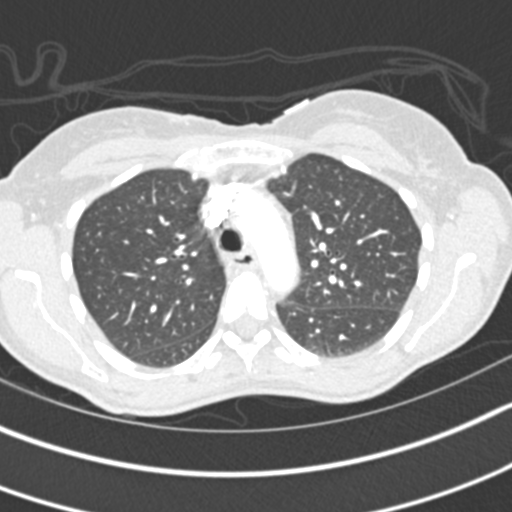
[im 206/280  mediastinal]
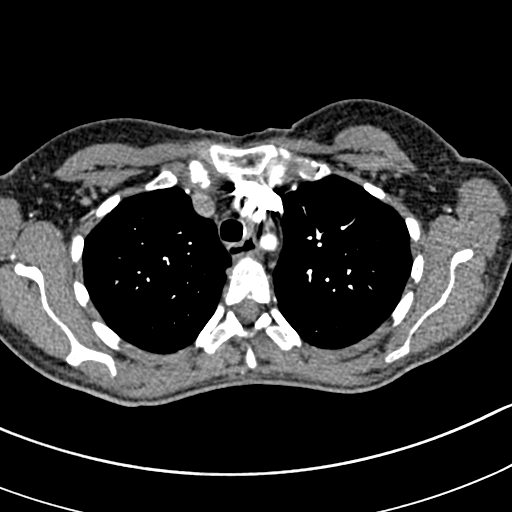
[im 221/280  lung]
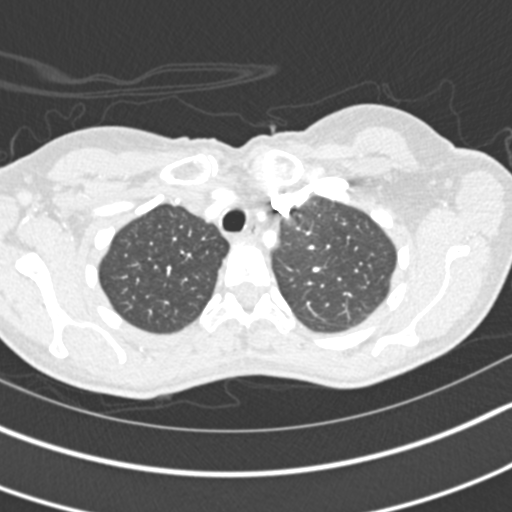
[im 235/280  mediastinal]
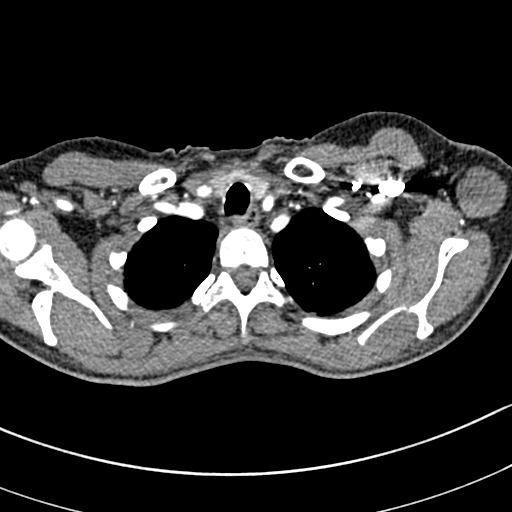
[im 250/280  lung]
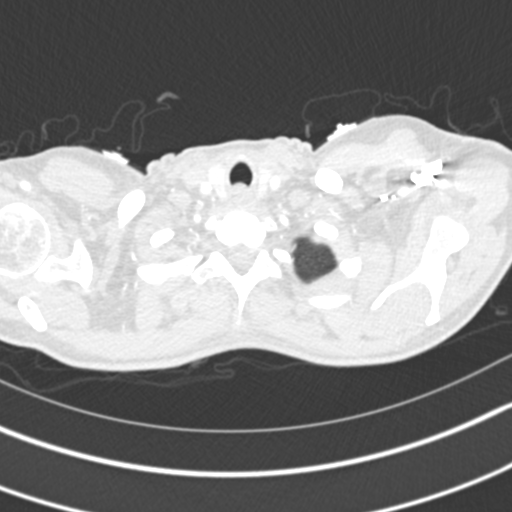
[im 265/280  mediastinal]
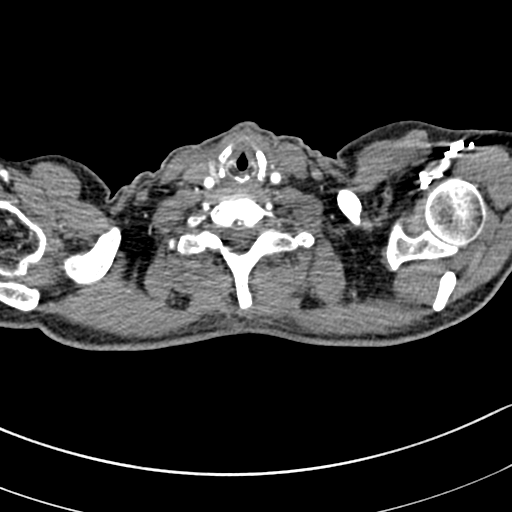

[Series 7: pe coronal mpr · coronal · 0.62mm/px · 1 of 99 slices shown]
[im 50/99  mediastinal]
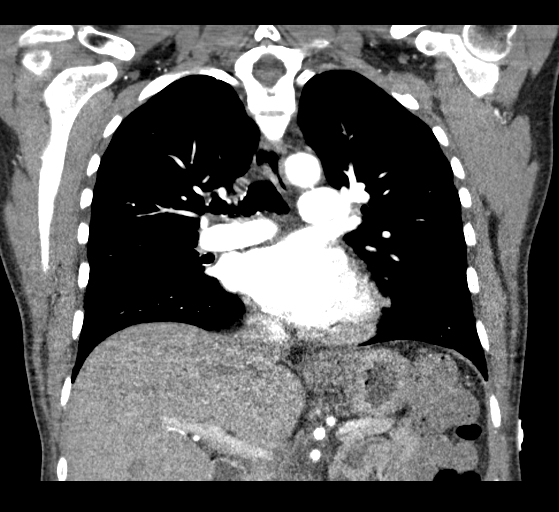

[19 of 36 positions shown; findings below may reference images not displayed]

FINDINGS: Cardiovascular: Aorta normal caliber without aneurysm or dissection.
Pulmonary arteries well opacified and patent. No evidence of
pulmonary embolism. No pericardial effusion.

Mediastinum/Nodes: Base of cervical region unremarkable. Scattered
air throughout esophagus without significant hiatal hernia. No
thoracic adenopathy.

Lungs/Pleura: Lungs clear. No pulmonary infiltrate, pleural effusion
or pneumothorax.

Upper Abdomen: Visualized upper abdomen unremarkable.

Musculoskeletal: No acute osseous findings.

Review of the MIP images confirms the above findings.
IMPRESSION: Normal exam.

## 2018-03-27 ENCOUNTER — Other Ambulatory Visit: Payer: Self-pay

## 2018-03-27 DIAGNOSIS — M791 Myalgia, unspecified site: Secondary | ICD-10-CM | POA: Diagnosis not present

## 2018-03-27 DIAGNOSIS — G43719 Chronic migraine without aura, intractable, without status migrainosus: Secondary | ICD-10-CM | POA: Diagnosis not present

## 2018-03-27 DIAGNOSIS — G518 Other disorders of facial nerve: Secondary | ICD-10-CM | POA: Diagnosis not present

## 2018-03-27 DIAGNOSIS — M542 Cervicalgia: Secondary | ICD-10-CM | POA: Diagnosis not present

## 2018-03-27 DIAGNOSIS — Z79899 Other long term (current) drug therapy: Secondary | ICD-10-CM | POA: Diagnosis not present

## 2018-03-29 LAB — CBC WITH DIFFERENTIAL/PLATELET
Basophils Absolute: 60 cells/uL (ref 0–200)
Basophils Relative: 1.2 %
Eosinophils Absolute: 360 cells/uL (ref 15–500)
Eosinophils Relative: 7.2 %
HCT: 38.8 % (ref 35.0–45.0)
Hemoglobin: 13 g/dL (ref 11.7–15.5)
Lymphs Abs: 1615 cells/uL (ref 850–3900)
MCH: 31 pg (ref 27.0–33.0)
MCHC: 33.5 g/dL (ref 32.0–36.0)
MCV: 92.4 fL (ref 80.0–100.0)
MPV: 13.6 fL — ABNORMAL HIGH (ref 7.5–12.5)
Monocytes Relative: 9.8 %
Neutro Abs: 2475 cells/uL (ref 1500–7800)
Neutrophils Relative %: 49.5 %
Platelets: 221 10*3/uL (ref 140–400)
RBC: 4.2 10*6/uL (ref 3.80–5.10)
RDW: 13.7 % (ref 11.0–15.0)
Total Lymphocyte: 32.3 %
WBC mixed population: 490 cells/uL (ref 200–950)
WBC: 5 10*3/uL (ref 3.8–10.8)

## 2018-03-29 LAB — COMPLETE METABOLIC PANEL WITH GFR
AG Ratio: 1.7 (calc) (ref 1.0–2.5)
ALT: 18 U/L (ref 6–29)
AST: 26 U/L (ref 10–35)
Albumin: 4.2 g/dL (ref 3.6–5.1)
Alkaline phosphatase (APISO): 43 U/L (ref 33–130)
BUN/Creatinine Ratio: 14 (calc) (ref 6–22)
BUN: 14 mg/dL (ref 7–25)
CO2: 30 mmol/L (ref 20–32)
Calcium: 9.9 mg/dL (ref 8.6–10.4)
Chloride: 105 mmol/L (ref 98–110)
Creat: 1.01 mg/dL — ABNORMAL HIGH (ref 0.50–0.99)
GFR, Est African American: 70 mL/min/{1.73_m2} (ref >=60)
GFR, Est Non African American: 60 mL/min/{1.73_m2} (ref >=60)
Globulin: 2.5 g/dL (calc) (ref 1.9–3.7)
Glucose, Bld: 114 mg/dL — ABNORMAL HIGH (ref 65–99)
Potassium: 4.4 mmol/L (ref 3.5–5.3)
Sodium: 140 mmol/L (ref 135–146)
Total Bilirubin: 0.4 mg/dL (ref 0.2–1.2)
Total Protein: 6.7 g/dL (ref 6.1–8.1)

## 2018-03-29 LAB — QUANTIFERON-TB GOLD PLUS
Mitogen-NIL: 9.97 IU/mL
NIL: 0.02 IU/mL
QuantiFERON-TB Gold Plus: NEGATIVE
TB1-NIL: 0.08 IU/mL
TB2-NIL: 0.11 IU/mL

## 2018-03-30 ENCOUNTER — Other Ambulatory Visit: Payer: Self-pay | Admitting: *Deleted

## 2018-03-30 MED ORDER — METHOTREXATE SODIUM CHEMO INJECTION 50 MG/2ML: INTRAMUSCULAR | 0 refills | Status: DC

## 2018-03-30 NOTE — Progress Notes (Signed)
Office Visit Note  [name]             Date of Birth: 10/31/1955           MRN: [id number]             PCP: Adrian Prince, MD Referring: Adrian Prince, MD Visit Date: 04/13/2018 Occupation: @GUAROCC @  Subjective:  Lower back pain   History of Present Illness: Becky [name] is a 62 y.o. female with history of seronegative rheumatoid arthritis, DDD, iritis, and Sjogren's.  She is on Enbrel 50 mg sq once weekly, MTX 0.8 ml once weekly, and folic acid 2 mg po daily. She is on Prolia sq injections every 6 months for management of osteoporosis. She denies any recent rheumatoid arthritis flares.  She denies any joint swelling. She has occasional bilateral knee pain.  She reports she has chronic pain in lower back and neck.  She denies any iritis flares.   Activities of Daily Living:  Patient reports morning stiffness for 5-10 minutes.   Patient Reports nocturnal pain.  Difficulty dressing/grooming: Denies Difficulty climbing stairs: Reports Difficulty getting out of chair: Reports Difficulty using hands for taps, buttons, cutlery, and/or writing: Reports  Review of Systems  Constitutional: Positive for fatigue.  HENT: Positive for mouth dryness. Negative for mouth sores, trouble swallowing, trouble swallowing and nose dryness.   Eyes: Positive for dryness. Negative for pain, redness, itching and visual disturbance.  Respiratory: Negative for cough, hemoptysis, shortness of breath, wheezing and difficulty breathing.   Cardiovascular: Negative for chest pain, palpitations, hypertension and swelling in legs/feet.  Gastrointestinal: Positive for constipation. Negative for abdominal pain, blood in stool, diarrhea, nausea and vomiting.  Endocrine: Negative for increased urination.  Genitourinary: Negative for painful urination, nocturia and pelvic pain.  Musculoskeletal: Positive for arthralgias, joint pain, joint swelling and morning stiffness. Negative for myalgias,  muscle weakness, muscle tenderness and myalgias.  Skin: Negative for color change, pallor, rash, hair loss, nodules/bumps, skin tightness, ulcers and sensitivity to sunlight.  Allergic/Immunologic: Negative for susceptible to infections.  Neurological: Negative for dizziness, light-headedness, numbness, headaches and memory loss.  Hematological: Negative for bruising/bleeding tendency and swollen glands.  Psychiatric/Behavioral: Negative for depressed mood, confusion and sleep disturbance. The patient is not nervous/anxious.     PMFS History:  Patient Active Problem List   Diagnosis Date Noted  . Eosinophilia 01/31/2017  . Iritis 01/04/2017  . History of joint swelling 08/24/2016  . Osteoarthritis of knees, bilateral 08/24/2016  . History of migraine 08/24/2016  . History of depression 08/24/2016  . History of osteoporosis 08/24/2016  . DDD (degenerative disc disease), lumbar 08/24/2016  . High risk medication use 08/24/2016  . Trigger finger, left middle finger 08/24/2016  . Trigger finger, right ring finger 08/24/2016  . 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 Increased risk of breast cancer 07/06/2015  . IDDM (insulin dependent diabetes mellitus) (HCC) 07/06/2015  . 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 Sjogren's syndrome (HCC) 07/06/2015  . Genetic testing 05/12/2015  . Family history of breast cancer 04/15/2015  . Fibrocystic breast disease 10/11/2012  . GERD 10/01/2009  . Chest pain with moderate risk for cardiac etiology 09/02/2009  . DYSPHAGIA 09/02/2009  . DIABETES MELLITUS-TYPE II 08/15/2009  . ANXIETY 08/15/2009  . ULCER-GASTRIC 08/15/2009  . CONSTIPATION 08/15/2009  . SLOW TRANSIT CONSTIPATION 08/15/2009  . FLATULENCE ERUCTATION AND GAS PAIN 08/15/2009    Past Medical History:  Diagnosis Date  .  Abnormal ECG   . Anemia   . Anxiety   . Cataract    . Cateracts bil eyes removed  .  GERD (gastroesophageal reflux disease)   . Heart murmur   . Hypercholesterolemia   . IDDM (insulin dependent diabetes mellitus) (HCC) 1969  . Migraine headache   . Neuromuscular disorder (HCC)    bil legs & feet - neuropathy  . Neuropathy   . Osteoporosis    10/2012  . Otosclerosis   . RA (rheumatoid arthritis) (HCC) 2000  . S/P cardiac cath August 2013   Normal coronaries and normal LV function.  . Sjoegren syndrome     Family History  Problem Relation Age of Onset  . Breast cancer Mother 82  . Colon polyps Mother        unspecified number  . Uterine cancer Mother   . Breast cancer Maternal Grandmother 52  . Stroke Father 43  . Rheum arthritis Sister   . Breast cancer Sister 48       lump; (-)GT (breast/ova ca panel, [facility])  . Skin cancer Maternal Grandfather        unspecified type  . Prostate cancer Maternal Grandfather        dx. late 59s  . Breast cancer Other        dx. in their late 88s  . Breast cancer Other        dx. 78-79  . Breast cancer Cousin        bilateral; dx. late 49s  . Colon cancer Neg Hx   . Esophageal cancer Neg Hx   . Rectal cancer Neg Hx   . Stomach cancer Neg Hx    Past Surgical History:  Procedure Laterality Date  . BREAST LUMPECTOMY  1995  . CARPAL TUNNEL RELEASE  2000  . CATARACT EXTRACTION Right 09-05-12  . CATARACT EXTRACTION Left 3/15  . CESAREAN SECTION  1987  . COLONOSCOPY    . EYE SURGERY     diabetic retinopathy   . HYSTEROSCOPY  02/2001   and D & C  . LYMPH NODE DISSECTION  2002  . TOE SURGERY  2000   bone removed   Social History   Social History Narrative  . Not on file    Objective: Vital Signs: BP 121/64 (BP Location: Left Arm, Patient Position: Sitting, Cuff Size: Normal)   Pulse 86   Resp 13   Ht 5\' 4"  (1.626 m)   Wt 126 lb 12.8 oz (57.5 kg)   LMP 09/19/2008   BMI 21.77 kg/m    Physical Exam  Constitutional: She is oriented to person, place, and time. She appears well-developed and well-nourished.    HENT:  Head: Normocephalic and atraumatic.  No parotid swelling. No oral or nasal ulcerations.  Eyes: Conjunctivae and EOM are normal.  Neck: Normal range of motion.  Cardiovascular: Normal rate, regular rhythm, normal heart sounds and intact distal pulses.  Pulmonary/Chest: Effort normal and breath sounds normal.  Abdominal: Soft. Bowel sounds are normal.  Lymphadenopathy:    She has no cervical adenopathy.  Neurological: She is alert and oriented to person, place, and time.  Skin: Skin is warm and dry. Capillary refill takes less than 2 seconds.  Psychiatric: She has a normal mood and affect. Her behavior is normal.  Nursing note and vitals reviewed.    Musculoskeletal Exam: C-spine, thoracic spine, and lumbar spine good ROM.  No midline spinal tenderness.  No SI joint tenderness. Shoulder joints, elbow joints, wrist joints,  MCPs, PIPs, and DIPs good ROM with no synovitis. PIP and DIP synovial thickening consistent with osteoarthritis. Hip joints, knee joints, ankle joints, MTPs, PIPs, and DIPs good ROM with no synovitis.  No warmth or effusion of knee joints.    CDAI Exam: CDAI Score: 0.2  Patient Global Assessment: 1 (mm); Provider Global Assessment: 1 (mm) Swollen: 0 ; Tender: 0  Joint Exam   Not documented   There is currently no information documented on the homunculus. Go to the Rheumatology activity and complete the homunculus joint exam.  Investigation: No additional findings.  Imaging: No results found.  Recent Labs: Lab Results  Component Value Date   WBC 5.0 03/27/2018   HGB 13.0 03/27/2018   PLT 221 03/27/2018   NA 140 03/27/2018   K 4.4 03/27/2018   CL 105 03/27/2018   CO2 30 03/27/2018   GLUCOSE 114 (H) 03/27/2018   BUN 14 03/27/2018   CREATININE 1.01 (H) 03/27/2018   BILITOT 0.4 03/27/2018   ALKPHOS 67 01/05/2017   AST 26 03/27/2018   ALT 18 03/27/2018   PROT 6.7 03/27/2018   ALBUMIN 3.9 01/05/2017   CALCIUM 9.9 03/27/2018   GFRAA 70 03/27/2018    QFTBGOLD NEGATIVE 04/06/2017   QFTBGOLDPLUS NEGATIVE 03/27/2018    Speciality Comments: No specialty comments available.  Procedures:  No procedures performed Allergies: Ciprofloxacin and Penicillins   Assessment / Plan:     Visit Diagnoses: 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 without organ or systems involvement [facility]): She has no synovitis on exam.  She has not had any recent rheumatoid arthritis flares.  She is clinically doing well on Enbrel 50 mg sq injections once weekly, MTX 0.8 ml sq once weekly, and folic acid 2 mg by mouth daily.  She will continue on this current treatment regimen. She does not need any refills at this time. She was questioning the cancer risk that Enbrel carries, and we discussed at length.  All questions were addressed. She was advised to notify us if she develops increased joint pain or joint swelling. She will follow up in 5 months.   High risk medication use - Enbrel 50 mg weekly, methotrexate 0.8 ML weekly, folic acid 2 mg daily (Orencia -discontinued due to the cost). TB gold negative 03/27/18. Creatinine 1.01.  We will continue to monitor.  CBC and CMP will be drawn in January and every 3 months to monitor for drug toxicity.   Iritis: She has not had any recent flares of iritis.   Sjogren's syndrome with keratoconjunctivitis sicca (HCC): She continues to have sicca symptoms.  No parotid swelling on exam.   Primary osteoarthritis of both knees: No warmth or effusion noted.  Good ROM with no discomfort.  She has occasional knee discomfort.   DDD (degenerative disc disease), lumbar:Chronic pain.  No midline spinal tenderness.  Good ROM.   History of osteoporosis - She has Prolia sq injections every 6 months. Dr. Evlyn Kanner orders prolia.   Other fatigue: Chronic   Other medical conditions are listed as follows:   History of gastroesophageal reflux (GERD)  History of diabetes mellitus  History of depression  History of  migraine  Increased risk of breast cancer: family history of breast cancer-Mother   Orders: No orders of the defined types were placed in this encounter.  No orders of the defined types were placed in this encounter.   Face-to-face time spent with patient was 30 minutes. Greater than 50% of time was spent in counseling and  coordination of care.  Follow-Up Instructions: Return in about 5 months (around 09/12/2018) for Rheumatoid arthritis, Sjogren's syndrome, Osteoarthritis.   Gearldine Bienenstock, PA-C  I examined and evaluated the patient with Sherron Ales PA.  Patient had no synovitis on my examination today.  She is doing quite well on combination of Enbrel and methotrexate.  She does have some osteoarthritis in her hands which causes discomfort.  The plan of care was discussed as noted above.  Pollyann Savoy, MD  Note - This record has been created using Animal nutritionist.  Chart creation errors have been sought, but may not always  have been located. Such creation errors do not reflect on  the standard of medical care.

## 2018-03-30 NOTE — Telephone Encounter (Signed)
Refill request received via fax  Last Visit 11/11/17 Next Visit: 04/13/18 Labs: 03/27/18 Glucose is 114. Creatinine borderline elevated  Okay to refill per Dr. Corliss Skains

## 2018-03-31 ENCOUNTER — Telehealth: Payer: Self-pay | Admitting: Pharmacy Technician

## 2018-03-31 NOTE — Telephone Encounter (Signed)
Enbrel patient assistance:  Spoke to patient about [employer] open enrollment, patient will come by office to complete application.  11:27 AM [name], CPhT

## 2018-04-03 ENCOUNTER — Other Ambulatory Visit (HOSPITAL_COMMUNITY): Payer: Self-pay | Admitting: *Deleted

## 2018-04-04 ENCOUNTER — Inpatient Hospital Stay (HOSPITAL_COMMUNITY): Admission: RE | Admit: 2018-04-04 | Payer: Medicare Other | Source: Ambulatory Visit

## 2018-04-04 DIAGNOSIS — L84 Corns and callosities: Secondary | ICD-10-CM | POA: Diagnosis not present

## 2018-04-04 DIAGNOSIS — E1040 Type 1 diabetes mellitus with diabetic neuropathy, unspecified: Secondary | ICD-10-CM | POA: Diagnosis not present

## 2018-04-04 DIAGNOSIS — E1042 Type 1 diabetes mellitus with diabetic polyneuropathy: Secondary | ICD-10-CM | POA: Diagnosis not present

## 2018-04-04 DIAGNOSIS — E7849 Other hyperlipidemia: Secondary | ICD-10-CM | POA: Diagnosis not present

## 2018-04-05 ENCOUNTER — Telehealth: Payer: Self-pay | Admitting: Rheumatology

## 2018-04-05 MED ORDER — ETANERCEPT 50 MG/ML ~~LOC~~ SOAJ: 50.0000 mg | SUBCUTANEOUS | 0 refills | Status: DC

## 2018-04-05 NOTE — Telephone Encounter (Signed)
Patient request a refill of Enbrel sent to the [facility].

## 2018-04-05 NOTE — Telephone Encounter (Signed)
Last Visit 11/11/17 Next Visit: 04/13/18 Labs: 03/27/18 Glucose is 114. Creatinine borderline elevated TB Gold: 03/27/18 Neg   Okay to refill per Sherron Ales, PA-C  FAxed to [employer]

## 2018-04-07 ENCOUNTER — Telehealth: Payer: Self-pay | Admitting: Pharmacy Technician

## 2018-04-07 NOTE — Telephone Encounter (Signed)
Received fax from [employer] stating patient's was not complete. We have not yet initiated patient's renewal for 2020 and called patient, she did not fax anything to [employer].  Called [employer], the rep stated it was triggered by the prescription for refills that was sent in last week. They will update their system.  Phone# [phone]  3:30 PM [name], CPhT

## 2018-04-11 DIAGNOSIS — E1040 Type 1 diabetes mellitus with diabetic neuropathy, unspecified: Secondary | ICD-10-CM | POA: Diagnosis not present

## 2018-04-12 DIAGNOSIS — G5600 Carpal tunnel syndrome, unspecified upper limb: Secondary | ICD-10-CM | POA: Diagnosis not present

## 2018-04-12 DIAGNOSIS — G629 Polyneuropathy, unspecified: Secondary | ICD-10-CM | POA: Diagnosis not present

## 2018-04-13 ENCOUNTER — Ambulatory Visit: Payer: Medicare Other | Admitting: Rheumatology

## 2018-04-13 ENCOUNTER — Encounter: Payer: Self-pay | Admitting: Rheumatology

## 2018-04-13 VITALS — BP 121/64 | HR 86 | Resp 13 | Ht 64.0 in | Wt 126.8 lb

## 2018-04-13 DIAGNOSIS — Z79899 Other long term (current) drug therapy: Secondary | ICD-10-CM | POA: Diagnosis not present

## 2018-04-13 DIAGNOSIS — Z8739 Personal history of other diseases of the musculoskeletal system and connective tissue: Secondary | ICD-10-CM

## 2018-04-13 DIAGNOSIS — M170 Bilateral primary osteoarthritis of knee: Secondary | ICD-10-CM

## 2018-04-13 DIAGNOSIS — H209 Unspecified iridocyclitis: Secondary | ICD-10-CM

## 2018-04-13 DIAGNOSIS — R5383 Other fatigue: Secondary | ICD-10-CM

## 2018-04-13 DIAGNOSIS — Z8719 Personal history of other diseases of the digestive system: Secondary | ICD-10-CM

## 2018-04-13 DIAGNOSIS — M3501 Sicca syndrome with keratoconjunctivitis: Secondary | ICD-10-CM

## 2018-04-13 DIAGNOSIS — Z8639 Personal history of other endocrine, nutritional and metabolic disease: Secondary | ICD-10-CM

## 2018-04-13 DIAGNOSIS — Z8659 Personal history of other mental and behavioral disorders: Secondary | ICD-10-CM

## 2018-04-13 DIAGNOSIS — Z8669 Personal history of other diseases of the nervous system and sense organs: Secondary | ICD-10-CM

## 2018-04-13 DIAGNOSIS — M0579 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 without organ or systems involvement: Secondary | ICD-10-CM

## 2018-04-13 DIAGNOSIS — Z9189 Other specified personal risk factors, not elsewhere classified: Secondary | ICD-10-CM

## 2018-04-13 DIAGNOSIS — M5136 Other intervertebral disc degeneration, lumbar region: Secondary | ICD-10-CM

## 2018-04-13 NOTE — Patient Instructions (Signed)
Standing Labs We placed an order today for your standing lab work.    Please come back and get your standing labs in January and every 3 months   We have open lab Monday through Friday from 8:30-11:30 AM and 1:30-4:00 PM  at the office of Dr. Shaili Deveshwar.   You may experience shorter wait times on Monday and Friday afternoons. The office is located at [address], [address], [place], [place] [address] No appointment is necessary.   Labs are drawn by [facility].  You may receive a bill from [facility] for your lab work. If you have any questions regarding directions or hours of operation,  please call [phone].   Just as a reminder please drink plenty of water prior to coming for your lab work. Thanks!   

## 2018-04-13 NOTE — Telephone Encounter (Signed)
Patient came by to sign application, she will call back with income information.  2:02 PM [name], CPhT

## 2018-04-19 DIAGNOSIS — G629 Polyneuropathy, unspecified: Secondary | ICD-10-CM | POA: Diagnosis not present

## 2018-04-21 NOTE — Telephone Encounter (Signed)
Received fax from [employer], renewal application has been APPROVED. Coverage is from 04/21/2018 to 06/21/2019.  Will send document to [facility].  Phone# [phone] Fax# [phone]  3:14 PM Jahsiah Carpenter Johnney Ou, CPhT

## 2018-04-27 ENCOUNTER — Other Ambulatory Visit (HOSPITAL_COMMUNITY): Payer: Self-pay | Admitting: *Deleted

## 2018-04-28 ENCOUNTER — Telehealth: Payer: Self-pay | Admitting: Oncology

## 2018-04-28 ENCOUNTER — Ambulatory Visit (HOSPITAL_COMMUNITY)
Admission: RE | Admit: 2018-04-28 | Discharge: 2018-04-28 | Disposition: A | Discharge status: Home / Self Care | Payer: Medicare Other | Source: Ambulatory Visit | Attending: Family Medicine | Admitting: Family Medicine

## 2018-04-28 DIAGNOSIS — M810 Age-related osteoporosis without current pathological fracture: Secondary | ICD-10-CM | POA: Insufficient documentation

## 2018-04-28 MED ORDER — DENOSUMAB 60 MG/ML ~~LOC~~ SOSY: 60.0000 mg | PREFILLED_SYRINGE | Freq: Once | SUBCUTANEOUS | Status: DC

## 2018-04-28 MED ORDER — DENOSUMAB 60 MG/ML ~~LOC~~ SOSY
PREFILLED_SYRINGE | SUBCUTANEOUS | Status: AC
  Administered 2018-04-28: 60 mg
  Filled 2018-04-28: qty 1

## 2018-04-28 NOTE — Telephone Encounter (Signed)
Returned patient's call.  Rescheduled patient's lab from 11/12 to 11/11 @ 12:30.

## 2018-05-01 ENCOUNTER — Other Ambulatory Visit: Payer: Self-pay | Admitting: *Deleted

## 2018-05-01 ENCOUNTER — Inpatient Hospital Stay: Payer: Medicare Other | Attending: Oncology

## 2018-05-01 DIAGNOSIS — M0579 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 without organ or systems involvement: Secondary | ICD-10-CM

## 2018-05-01 DIAGNOSIS — N6019 Diffuse cystic mastopathy of unspecified breast: Secondary | ICD-10-CM

## 2018-05-01 DIAGNOSIS — M3500 Sicca syndrome, unspecified: Secondary | ICD-10-CM | POA: Insufficient documentation

## 2018-05-01 DIAGNOSIS — E119 Type 2 diabetes mellitus without complications: Secondary | ICD-10-CM | POA: Diagnosis not present

## 2018-05-01 DIAGNOSIS — Z803 Family history of malignant neoplasm of breast: Secondary | ICD-10-CM

## 2018-05-01 DIAGNOSIS — R922 Inconclusive mammogram: Secondary | ICD-10-CM | POA: Insufficient documentation

## 2018-05-01 DIAGNOSIS — B001 Herpesviral vesicular dermatitis: Secondary | ICD-10-CM | POA: Diagnosis not present

## 2018-05-01 DIAGNOSIS — D721 Eosinophilia, unspecified: Secondary | ICD-10-CM

## 2018-05-01 DIAGNOSIS — M069 Rheumatoid arthritis, unspecified: Secondary | ICD-10-CM | POA: Diagnosis not present

## 2018-05-01 DIAGNOSIS — Z9189 Other specified personal risk factors, not elsewhere classified: Secondary | ICD-10-CM

## 2018-05-01 DIAGNOSIS — Z79899 Other long term (current) drug therapy: Secondary | ICD-10-CM | POA: Insufficient documentation

## 2018-05-01 DIAGNOSIS — Z1379 Encounter for other screening for genetic and chromosomal anomalies: Secondary | ICD-10-CM

## 2018-05-01 LAB — CBC WITH DIFFERENTIAL/PLATELET
Abs Immature Granulocytes: 0.01 10*3/uL (ref 0.00–0.07)
Basophils Absolute: 0.1 10*3/uL (ref 0.0–0.1)
Basophils Relative: 2 %
Eosinophils Absolute: 0.4 10*3/uL (ref 0.0–0.5)
Eosinophils Relative: 9 %
HCT: 41.2 % (ref 36.0–46.0)
Hemoglobin: 13 g/dL (ref 12.0–15.0)
Immature Granulocytes: 0 %
Lymphocytes Relative: 37 %
Lymphs Abs: 1.5 10*3/uL (ref 0.7–4.0)
MCH: 31 pg (ref 26.0–34.0)
MCHC: 31.6 g/dL (ref 30.0–36.0)
MCV: 98.1 fL (ref 80.0–100.0)
Monocytes Absolute: 0.3 10*3/uL (ref 0.1–1.0)
Monocytes Relative: 8 %
Neutro Abs: 1.8 10*3/uL (ref 1.7–7.7)
Neutrophils Relative %: 44 %
Platelets: 233 10*3/uL (ref 150–400)
RBC: 4.2 MIL/uL (ref 3.87–5.11)
RDW: 15.4 % (ref 11.5–15.5)
WBC: 4.1 10*3/uL (ref 4.0–10.5)
nRBC: 0 % (ref 0.0–0.2)

## 2018-05-01 LAB — CMP (CANCER CENTER ONLY)
ALT: 34 U/L (ref 0–44)
AST: 36 U/L (ref 15–41)
Albumin: 4 g/dL (ref 3.5–5.0)
Alkaline Phosphatase: 60 U/L (ref 38–126)
Anion gap: 7 (ref 5–15)
BUN: 12 mg/dL (ref 8–23)
CO2: 26 mmol/L (ref 22–32)
Calcium: 9.4 mg/dL (ref 8.9–10.3)
Chloride: 109 mmol/L (ref 98–111)
Creatinine: 0.99 mg/dL (ref 0.44–1.00)
GFR, Est AFR Am: >60 mL/min (ref >60)
GFR, Estimated: >60 mL/min (ref >60)
Glucose, Bld: 111 mg/dL — ABNORMAL HIGH (ref 70–99)
Potassium: 4.5 mmol/L (ref 3.5–5.1)
Sodium: 142 mmol/L (ref 135–145)
Total Bilirubin: 0.5 mg/dL (ref 0.3–1.2)
Total Protein: 7.1 g/dL (ref 6.5–8.1)

## 2018-05-02 ENCOUNTER — Other Ambulatory Visit: Payer: Medicare Other

## 2018-05-09 ENCOUNTER — Inpatient Hospital Stay (HOSPITAL_BASED_OUTPATIENT_CLINIC_OR_DEPARTMENT_OTHER): Payer: Medicare Other | Admitting: Oncology

## 2018-05-09 VITALS — BP 144/50 | HR 85 | Temp 98.2°F | Resp 18 | Ht 64.0 in | Wt 129.6 lb

## 2018-05-09 DIAGNOSIS — Z79899 Other long term (current) drug therapy: Secondary | ICD-10-CM | POA: Diagnosis not present

## 2018-05-09 DIAGNOSIS — Z8739 Personal history of other diseases of the musculoskeletal system and connective tissue: Secondary | ICD-10-CM

## 2018-05-09 DIAGNOSIS — Z1239 Encounter for other screening for malignant neoplasm of breast: Secondary | ICD-10-CM | POA: Insufficient documentation

## 2018-05-09 DIAGNOSIS — M3500 Sicca syndrome, unspecified: Secondary | ICD-10-CM

## 2018-05-09 DIAGNOSIS — D721 Eosinophilia, unspecified: Secondary | ICD-10-CM

## 2018-05-09 DIAGNOSIS — M069 Rheumatoid arthritis, unspecified: Secondary | ICD-10-CM

## 2018-05-09 DIAGNOSIS — R922 Inconclusive mammogram: Secondary | ICD-10-CM | POA: Diagnosis not present

## 2018-05-09 DIAGNOSIS — E119 Type 2 diabetes mellitus without complications: Secondary | ICD-10-CM

## 2018-05-09 DIAGNOSIS — B001 Herpesviral vesicular dermatitis: Secondary | ICD-10-CM | POA: Diagnosis not present

## 2018-05-09 DIAGNOSIS — Z9189 Other specified personal risk factors, not elsewhere classified: Secondary | ICD-10-CM | POA: Insufficient documentation

## 2018-05-09 MED ORDER — VALACYCLOVIR HCL 1 G PO TABS: 1000.0000 mg | ORAL_TABLET | Freq: Every day | ORAL | 0 refills | Status: DC

## 2018-05-09 NOTE — Progress Notes (Signed)
[place]  Telephone:([phone]) [phone] [phone]) [phone]     ID: Becky [name] DOB: 08-18-1955  MR#: [id number]  SJG#:[id number]  Patient Care Team: Reynold Bowen, MD as PCP - General (Endocrinology) Yisroel Ramming, Everardo All, MD as Consulting Physician (Obstetrics and Gynecology) , Virgie Dad, MD as Consulting Physician (Oncology) Pieter Partridge, DO as Consulting Physician (Neurology) Yisroel Ramming, Everardo All, MD as Consulting Physician (Obstetrics and Gynecology) PCP: Reynold Bowen, MD SU:  OTHER MD:  CHIEF COMPLAINT: breast cancer high risk  CURRENT TREATMENT: intensified screening   INTERVAL HISTORY: Becky [name] returns today for follow-up of her high risk breast cancer situation. She is under intensified screening.   On 05/23/2017, she underwent bilateral breast MRI showing the breast composition to be category D, extremely dense. There was no evidence of malignancy.  On 11/17/2017, she had mammography at [facility] showing the breast density to be category D. There was no evidence of malignancy.   REVIEW OF SYSTEMS: Becky [name] reports her daughter will visit around Christmas. She reports she was recently told by her rheumatologist she has osteoarthritis. She recently returned from a trip to [place], noting she walked a lot during the trip. Pap performed on 09/21/2017 was normal. The patient denies unusual headaches, visual changes, nausea, vomiting, stiff neck, dizziness, or gait imbalance. There has been no cough, phlegm production, or pleurisy, no chest pain or pressure, and no change in bowel or bladder habits. The patient denies fever, rash, bleeding, unexplained fatigue or unexplained weight loss. A detailed review of systems was otherwise entirely negative.    HISTORY OF PRESENT ILLNESS: From the initial intake note:   Becky [name] was separately referred to the [facility] by Dr Forde Dandy for evaluation of eosinophilia and by Dr Quincy Simmonds for genetics counseling. It  is best to discuss these unrelated problem separately:  Eosinophilia: We have a CBC with differential dating back to 01/26/2012. This showed WBC 6.1, Hb 11.9 and platelets 248. Absolute eosinophil count was 1.0. At that time the patient was worked up for atypical chest [pain, with an equivocal Myoview. She underwent catheterization 01/28/2012 which showed normal coronaries. Chest X-ray 01/26/2012 showed no infiltrates. There are multiple other CBC reports, all normal, but w/o differentials. We just repeated a CBC/diff here, which shows WBC 5.7, Hb 13.3, platelets 265, and abs eosiniphil count  1.2 (normal to 0.5 ).  The patient has a significant rheumatoid history including rheumatoid arthritis and Sjogren's syndrome, followed by Dr Tomi Likens. She tells me the arthritis is well controlled. Currently she denies rash, difficulty swallowing or painful swallowing, gastritis or history of ulcers; denies cough, phlegm production, pleurisy, or history of repeated URIs or PNA; or changes in bowel habits. She underwent biopsy of an anterior neck mass 10/13/2009 which showed only reactive lymphoid hyperplasia([id number]). She denies travel outside the country, fevers, unexplained weight loss of unexplained fatigue.  High Risk for Breast Cancer: The patient's mother, currently 58, was diagnosed with breast cancer at age 67. She also has a history of colon polyps. The patient's father is 6, with no history of cancer. The patient's only full sister, was diagnosed with breast cancer at age 35. She had a negative test through the breast or ovarian cancer panel. Further on the mother's side there is one cousin with "female cancer" diagnosed in her 66s, and another with cervical cancer in the 2s. The patient's maternal grandmother was diagnosed with breast cancer at age 12. That grandmother had 3 sisters 2 of whom were  diagnosed with breast cancers in their 41s. (The third sister died in her 34s from surgical complications not  related to cancer).-- In addition, the patient has breast density category C/D. Her breasts are lumpy making screening exam additionally difficult.  Biopsy of several right breast masses 10/09/1998 showed no evidence of malignancy ([id number])  Her subsequent history is as detailed below.  PAST MEDICAL HISTORY: Past Medical History:  Diagnosis Date  . Abnormal ECG   . Anemia   . Anxiety   . Cataract    . Cateracts bil eyes removed  . GERD (gastroesophageal reflux disease)   . Heart murmur   . Hypercholesterolemia   . IDDM (insulin dependent diabetes mellitus) ([place]) 1969  . Migraine headache   . Neuromuscular disorder (HCC)    bil legs & feet - neuropathy  . Neuropathy   . Osteoporosis    10/2012  . Otosclerosis   . RA (rheumatoid arthritis) ([place]) 2000  . S/P cardiac cath August 2013   Normal coronaries and normal LV function.  . Sjoegren syndrome     PAST SURGICAL HISTORY: Past Surgical History:  Procedure Laterality Date  . BREAST LUMPECTOMY  1995  . CARPAL TUNNEL RELEASE  2000  . CATARACT EXTRACTION Right 09-05-12  . CATARACT EXTRACTION Left 3/15  . CESAREAN SECTION  1987  . COLONOSCOPY    . EYE SURGERY     diabetic retinopathy   . HYSTEROSCOPY  02/2001   and D & C  . LYMPH NODE DISSECTION  2002  . TOE SURGERY  2000   bone removed    FAMILY HISTORY Family History  Problem Relation Age of Onset  . Breast cancer Mother 34  . Colon polyps Mother        unspecified number  . Uterine cancer Mother   . Breast cancer Maternal Grandmother 75  . Stroke Father 44  . Rheum arthritis Sister   . Breast cancer Sister 48       lump; (-)GT (breast/ova ca panel, [facility])  . Skin cancer Maternal Grandfather        unspecified type  . Prostate cancer Maternal Grandfather        dx. late 45s  . Breast cancer Other        dx. in their late 72s  . Breast cancer Other        dx. 78-79  . Breast cancer Cousin        bilateral; dx. late 50s  . Colon cancer Neg Hx   .  Esophageal cancer Neg Hx   . Rectal cancer Neg Hx   . Stomach cancer Neg Hx    (see also history of present illness above).   GYNECOLOGIC HISTORY:  Patient's last menstrual period was 09/19/2008.  Menarche age 75, first live birth age 26, she is GX P1, with the change of life occurring at age 83. She did not take hormone replacement. 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 Her husband Luciana Axe works as a Games developer. More specifically he does interior trim for [employer], so he does a lot of traveling. The patient's daughter is a Programme researcher, broadcasting/film/video and works in [place]. She got married in September 2016. The patient is not a church attender.     HEALTH MAINTENANCE: Social History   Tobacco Use  . Smoking status: Never Smoker  . Smokeless tobacco: Never Used  Substance Use Topics  .  Alcohol use: Yes    Alcohol/week: 4.0 standard drinks    Types: 2 Shots of liquor, 2 Standard drinks or equivalent per week    Comment: maybe 1-2 cocktails per wk  . Drug use: No     Colonoscopy: 2012/ [facility]  PAP:  Bone density:   On forteo  Lipid panel:  Allergies  Allergen Reactions  . Ciprofloxacin Rash  . Penicillins Rash    Current Outpatient Medications  Medication Sig Dispense Refill  . aspirin 81 MG tablet Take 81 mg by mouth daily.    . calcium carbonate (OS-CAL) 600 MG TABS Take 600 mg by mouth 2 (two) times daily with a meal.     . CONTOUR NEXT TEST test strip   6  . cyclobenzaprine (FLEXERIL) 10 MG tablet Take 10 mg by mouth 3 (three) times daily as needed for muscle spasms. Reported on 07/03/2015    . denosumab (PROLIA) 60 MG/ML SOSY injection Inject 60 mg into the skin every 6 (six) months. Please deliver to clinic 1 Syringe 0  . DULoxetine (CYMBALTA) 60 MG capsule Take 60 mg by mouth daily.    Marland Kitchen etanercept (ENBREL SURECLICK) 50 MG/ML injection Inject 0.98 mLs (50 mg total)  into the skin once a week. 82.80 mL 0  . folic acid (FOLVITE) 1 MG tablet Take 2 tablets (2 mg total) by mouth daily. 180 tablet 3  . furosemide (LASIX) 20 MG tablet Take 20 mg by mouth every other day.    . insulin aspart (NOVOLOG) 100 UNIT/ML injection Insulin pump    . losartan (COZAAR) 25 MG tablet Take 50 mg by mouth daily.     . methotrexate 50 MG/2ML injection INJECT 0.8 MILLILITERS INTO THE SKIN ONCE WEEKLY (Patient taking differently: INJECT 0.6 MILLILITERS INTO THE SKIN ONCE WEEKLY) 10 mL 0  . Multiple Vitamin (MULTIVITAMIN) tablet Take 1 tablet by mouth daily.    Marland Kitchen POLYETHYLENE GLYCOL 3350 PO Take by mouth 2 (two) times daily.     . pravastatin (PRAVACHOL) 20 MG tablet Take 40 mg by mouth daily.     . SUMAtriptan (IMITREX) 100 MG tablet Take 100 mg by mouth every 2 (two) hours as needed.    . topiramate (TOPAMAX) 200 MG tablet Take 200 mg by mouth 2 (two) times daily.    . Tuberculin-Allergy Syringes 27G X 1/2" 1 ML KIT Inject 1 Syringe into the skin once a week. To be used with weekly methotrexate injections. 12 each 3  . valACYclovir (VALTREX) 1000 MG tablet Take 1 tablet (1,000 mg total) by mouth daily. 30 tablet 0  . vitamin B-12 (CYANOCOBALAMIN) 1000 MCG tablet Take 1,000 mcg by mouth daily.     No current facility-administered medications for this visit.     OBJECTIVE: Middle-aged white woman in no acute distress  Vitals:   05/09/18 1307  BP: (!) 144/50  Pulse: 85  Resp: 18  Temp: 98.2 F (36.8 C)  SpO2: 100%     Body mass index is 22.25 kg/m.    ECOG FS:1 - Symptomatic but completely ambulatory  Sclerae unicteric, EOMs intact Oropharynx clear and moist; lip lesion consistent with herpes No cervical or supraclavicular adenopathy Lungs no rales or rhonchi Heart regular rate and rhythm Abd soft, nontender, positive bowel sounds Neuro: nonfocal, well oriented, appropriate affect Breasts: Both breasts are quite lumpy as previously noted.  There are no skin or nipple  changes of concern  CMP     Component Value Date/Time   NA 142  05/01/2018 1236   NA 142 01/05/2017 1257   K 4.5 05/01/2018 1236   K 4.3 01/05/2017 1257   CL 109 05/01/2018 1236   CO2 26 05/01/2018 1236   CO2 24 01/05/2017 1257   GLUCOSE 111 (H) 05/01/2018 1236   GLUCOSE 95 01/05/2017 1257   BUN 12 05/01/2018 1236   BUN 11.4 01/05/2017 1257   CREATININE 0.99 05/01/2018 1236   CREATININE 1.01 (H) 03/27/2018 1424   CREATININE 1.0 01/05/2017 1257   CALCIUM 9.4 05/01/2018 1236   CALCIUM 9.7 01/05/2017 1257   PROT 7.1 05/01/2018 1236   PROT 7.1 01/05/2017 1257   ALBUMIN 4.0 05/01/2018 1236   ALBUMIN 3.9 01/05/2017 1257   AST 36 05/01/2018 1236   AST 24 01/05/2017 1257   ALT 34 05/01/2018 1236   ALT 18 01/05/2017 1257   ALKPHOS 60 05/01/2018 1236   ALKPHOS 67 01/05/2017 1257   BILITOT 0.5 05/01/2018 1236   BILITOT 0.26 01/05/2017 1257   GFRNONAA >60 05/01/2018 1236   GFRNONAA 60 03/27/2018 1424   GFRAA >60 05/01/2018 1236   GFRAA 70 03/27/2018 1424    INo results found for: SPEP, UPEP  Lab Results  Component Value Date   WBC 4.1 05/01/2018   NEUTROABS 1.8 05/01/2018   HGB 13.0 05/01/2018   HCT 41.2 05/01/2018   MCV 98.1 05/01/2018   PLT 233 05/01/2018      Chemistry      Component Value Date/Time   NA 142 05/01/2018 1236   NA 142 01/05/2017 1257   K 4.5 05/01/2018 1236   K 4.3 01/05/2017 1257   CL 109 05/01/2018 1236   CO2 26 05/01/2018 1236   CO2 24 01/05/2017 1257   BUN 12 05/01/2018 1236   BUN 11.4 01/05/2017 1257   CREATININE 0.99 05/01/2018 1236   CREATININE 1.01 (H) 03/27/2018 1424   CREATININE 1.0 01/05/2017 1257      Component Value Date/Time   CALCIUM 9.4 05/01/2018 1236   CALCIUM 9.7 01/05/2017 1257   ALKPHOS 60 05/01/2018 1236   ALKPHOS 67 01/05/2017 1257   AST 36 05/01/2018 1236   AST 24 01/05/2017 1257   ALT 34 05/01/2018 1236   ALT 18 01/05/2017 1257   BILITOT 0.5 05/01/2018 1236   BILITOT 0.26 01/05/2017 1257       No results  found for: LABCA2  No components found for: LABCA125  No results for input(s): INR in the last 168 hours.  Urinalysis    Component Value Date/Time   COLORURINE YELLOW 11/09/2017 [place] 11/09/2017 1438   LABSPEC 1.022 11/09/2017 1438   PHURINE 6.0 11/09/2017 1438   GLUCOSEU 2+ (A) 11/09/2017 1438   HGBUR NEGATIVE 11/09/2017 1438   BILIRUBINUR n 08/02/2013 1542   KETONESUR NEGATIVE 11/09/2017 1438   PROTEINUR NEGATIVE 11/09/2017 1438   UROBILINOGEN negative 08/02/2013 1542   NITRITE NEGATIVE 11/09/2017 1438   LEUKOCYTESUR NEGATIVE 11/09/2017 1438    STUDIES: Mammography with tomography at [facility] 11/16/2016 found a breast density to be category D. There was no evidence of malignancy.  ASSESSMENT: 62 y.o. [place], [place] woman at high risk for breast cancer, with a history of eosinophilia in the setting of rheumatoid arthritis and Sjogren's syndrome  (1) genetics testing on 04/15/15 through the [place] Panel through [facility] found no deleterious mutations in M, BARD1, BRCA1, BRCA2, BRIP1, CDH1, CHEK2, FANCC, MLH1, MSH2, MSH6, NBN, PALB2, PMS2, PTEN, RAD51C, RAD51D, TP53, and XRCC2. This panel also includes deletion/duplication analysis (without  sequencing) for EPCAM.  (2)  benign eosinophilia likely associated with rheumatoid arthritis or secondary to medication: resolved  (3) breast cancer high risk:  (a) intolerant of tamoxifen (constipation)  (b) screening intensitifcation with yearly breast MRI starting Oct 2017    PLAN: She will continue to benefit from intensified screening given the high density of her breasts.  She will have a breast MRI in December of this year and then her mammogram will be in May.  It would be optimal if she saw Dr. Quincy Simmonds after her May mammography.  That wayTammy will get a physician breast exam every 68-month  I wrote her for Valtrex for her small herpetic lesion in the left upper lip.  She should  take it until cleared and then hold the additional pills for the next episode  Otherwise she will return to see me in 1 year.  She knows to call for any other problems that may develop before that visit.   GChauncey Cruel MD   05/09/2018 1:37 PM Medical On[facility] H[facility] [id number]. [phone]   Fax. [phone] I, KWilburn Mylaram acting as scribe for Dr. GVirgie Dad .  I, GLurline DelMD, have reviewed the above documentation for accuracy and completeness, and I agree with the above.

## 2018-05-10 DIAGNOSIS — M791 Myalgia, unspecified site: Secondary | ICD-10-CM | POA: Diagnosis not present

## 2018-05-10 DIAGNOSIS — M542 Cervicalgia: Secondary | ICD-10-CM | POA: Diagnosis not present

## 2018-05-10 DIAGNOSIS — G518 Other disorders of facial nerve: Secondary | ICD-10-CM | POA: Diagnosis not present

## 2018-05-10 DIAGNOSIS — G43719 Chronic migraine without aura, intractable, without status migrainosus: Secondary | ICD-10-CM | POA: Diagnosis not present

## 2018-05-16 DIAGNOSIS — E1040 Type 1 diabetes mellitus with diabetic neuropathy, unspecified: Secondary | ICD-10-CM | POA: Diagnosis not present

## 2018-05-23 ENCOUNTER — Other Ambulatory Visit: Payer: Self-pay | Admitting: *Deleted

## 2018-05-23 MED ORDER — FOLIC ACID 1 MG PO TABS: 2.0000 mg | ORAL_TABLET | Freq: Every day | ORAL | 3 refills | Status: DC

## 2018-05-23 NOTE — Telephone Encounter (Signed)
Refill Request received via fax   Last Visit: 04/13/18 Next visit: 09/19/18  Okay to refill per Dr. Corliss Skains

## 2018-05-29 ENCOUNTER — Ambulatory Visit
Admission: RE | Admit: 2018-05-29 | Discharge: 2018-05-29 | Disposition: A | Discharge status: Home / Self Care | Payer: Medicare Other | Source: Ambulatory Visit | Attending: Oncology | Admitting: Oncology

## 2018-05-29 ENCOUNTER — Other Ambulatory Visit: Payer: Medicare Other

## 2018-05-29 DIAGNOSIS — Z8739 Personal history of other diseases of the musculoskeletal system and connective tissue: Secondary | ICD-10-CM

## 2018-05-29 DIAGNOSIS — Z1239 Encounter for other screening for malignant neoplasm of breast: Secondary | ICD-10-CM

## 2018-05-29 DIAGNOSIS — D721 Eosinophilia, unspecified: Secondary | ICD-10-CM

## 2018-05-29 DIAGNOSIS — N6489 Other specified disorders of breast: Secondary | ICD-10-CM | POA: Diagnosis not present

## 2018-05-29 MED ORDER — GADOBUTROL 1 MMOL/ML IV SOLN
7.0000 mL | Freq: Once | INTRAVENOUS | Status: AC | PRN
  Administered 2018-05-29: 7 mL via INTRAVENOUS

## 2018-05-30 ENCOUNTER — Telehealth: Payer: Self-pay

## 2018-05-30 NOTE — Telephone Encounter (Signed)
-----   Message from Loa Socks, NP sent at 05/30/2018  3:11 PM EST ----- Please let patient know MRI breast normal, repeat recommended in one year ----- Message ----- From: Interface, Rad Results In Sent: 05/30/2018   1:04 PM EST To: Lowella Dell, MD

## 2018-05-30 NOTE — Telephone Encounter (Signed)
Spoke with patient informing of normal MRI results and to repeat in a year.  Patient voiced understanding and had no questions/concerns at this time

## 2018-06-02 ENCOUNTER — Encounter

## 2018-06-06 DIAGNOSIS — E109 Type 1 diabetes mellitus without complications: Secondary | ICD-10-CM | POA: Diagnosis not present

## 2018-06-06 DIAGNOSIS — Z794 Long term (current) use of insulin: Secondary | ICD-10-CM | POA: Diagnosis not present

## 2018-06-22 DIAGNOSIS — E1040 Type 1 diabetes mellitus with diabetic neuropathy, unspecified: Secondary | ICD-10-CM | POA: Diagnosis not present

## 2018-06-25 DIAGNOSIS — E1040 Type 1 diabetes mellitus with diabetic neuropathy, unspecified: Secondary | ICD-10-CM | POA: Diagnosis not present

## 2018-07-06 DIAGNOSIS — G518 Other disorders of facial nerve: Secondary | ICD-10-CM | POA: Diagnosis not present

## 2018-07-06 DIAGNOSIS — M791 Myalgia, unspecified site: Secondary | ICD-10-CM | POA: Diagnosis not present

## 2018-07-06 DIAGNOSIS — G43719 Chronic migraine without aura, intractable, without status migrainosus: Secondary | ICD-10-CM | POA: Diagnosis not present

## 2018-07-06 DIAGNOSIS — M542 Cervicalgia: Secondary | ICD-10-CM | POA: Diagnosis not present

## 2018-07-10 DIAGNOSIS — L570 Actinic keratosis: Secondary | ICD-10-CM | POA: Diagnosis not present

## 2018-07-22 DIAGNOSIS — E1040 Type 1 diabetes mellitus with diabetic neuropathy, unspecified: Secondary | ICD-10-CM | POA: Diagnosis not present

## 2018-08-11 ENCOUNTER — Other Ambulatory Visit: Payer: Self-pay | Admitting: *Deleted

## 2018-08-11 DIAGNOSIS — Z79899 Other long term (current) drug therapy: Secondary | ICD-10-CM

## 2018-08-12 LAB — CBC WITH DIFFERENTIAL/PLATELET
Absolute Monocytes: 395 cells/uL (ref 200–950)
Basophils Absolute: 61 cells/uL (ref 0–200)
Basophils Relative: 1.3 %
Eosinophils Absolute: 221 cells/uL (ref 15–500)
Eosinophils Relative: 4.7 %
HCT: 41.8 % (ref 35.0–45.0)
Hemoglobin: 14.2 g/dL (ref 11.7–15.5)
Lymphs Abs: 1325 cells/uL (ref 850–3900)
MCH: 31.2 pg (ref 27.0–33.0)
MCHC: 34 g/dL (ref 32.0–36.0)
MCV: 91.9 fL (ref 80.0–100.0)
MPV: 13.4 fL — ABNORMAL HIGH (ref 7.5–12.5)
Monocytes Relative: 8.4 %
Neutro Abs: 2698 cells/uL (ref 1500–7800)
Neutrophils Relative %: 57.4 %
Platelets: 282 10*3/uL (ref 140–400)
RBC: 4.55 10*6/uL (ref 3.80–5.10)
RDW: 14 % (ref 11.0–15.0)
Total Lymphocyte: 28.2 %
WBC: 4.7 10*3/uL (ref 3.8–10.8)

## 2018-08-12 LAB — COMPLETE METABOLIC PANEL WITH GFR
AG Ratio: 1.6 (calc) (ref 1.0–2.5)
ALT: 23 U/L (ref 6–29)
AST: 31 U/L (ref 10–35)
Albumin: 4.3 g/dL (ref 3.6–5.1)
Alkaline phosphatase (APISO): 52 U/L (ref 37–153)
BUN/Creatinine Ratio: 18 (calc) (ref 6–22)
BUN: 19 mg/dL (ref 7–25)
CO2: 26 mmol/L (ref 20–32)
Calcium: 10.1 mg/dL (ref 8.6–10.4)
Chloride: 106 mmol/L (ref 98–110)
Creat: 1.08 mg/dL — ABNORMAL HIGH (ref 0.50–0.99)
GFR, Est African American: 64 mL/min/{1.73_m2} (ref >=60)
GFR, Est Non African American: 55 mL/min/{1.73_m2} — ABNORMAL LOW (ref >=60)
Globulin: 2.7 g/dL (calc) (ref 1.9–3.7)
Glucose, Bld: 180 mg/dL — ABNORMAL HIGH (ref 65–99)
Potassium: 4.4 mmol/L (ref 3.5–5.3)
Sodium: 141 mmol/L (ref 135–146)
Total Bilirubin: 0.4 mg/dL (ref 0.2–1.2)
Total Protein: 7 g/dL (ref 6.1–8.1)

## 2018-08-14 NOTE — Progress Notes (Signed)
Creatinine is elevated.  Please have patient reduce methotrexate to 0.6 mL subcu weekly.  Please repeat BMP with GFR in 2 weeks.

## 2018-08-15 ENCOUNTER — Telehealth: Payer: Self-pay | Admitting: *Deleted

## 2018-08-15 DIAGNOSIS — Z79899 Other long term (current) drug therapy: Secondary | ICD-10-CM

## 2018-08-15 MED ORDER — METHOTREXATE SODIUM CHEMO INJECTION 50 MG/2ML: 10.0000 mg | INTRAMUSCULAR | 0 refills | Status: DC

## 2018-08-15 NOTE — Telephone Encounter (Signed)
-----   Message from Pollyann Savoy, MD sent at 08/15/2018 11:26 AM EST ----- She may reduce methotrexate to 0.4 mL subcu weekly and recheck BMP with GFR in 2 weeks.

## 2018-08-15 NOTE — Progress Notes (Signed)
She may reduce methotrexate to 0.4 mL subcu weekly and recheck BMP with GFR in 2 weeks.

## 2018-08-16 DIAGNOSIS — E1042 Type 1 diabetes mellitus with diabetic polyneuropathy: Secondary | ICD-10-CM | POA: Diagnosis not present

## 2018-08-16 DIAGNOSIS — E1040 Type 1 diabetes mellitus with diabetic neuropathy, unspecified: Secondary | ICD-10-CM | POA: Diagnosis not present

## 2018-08-16 DIAGNOSIS — N183 Chronic kidney disease, stage 3 (moderate): Secondary | ICD-10-CM | POA: Diagnosis not present

## 2018-08-16 DIAGNOSIS — H3520 Other non-diabetic proliferative retinopathy, unspecified eye: Secondary | ICD-10-CM | POA: Diagnosis not present

## 2018-08-16 DIAGNOSIS — E1142 Type 2 diabetes mellitus with diabetic polyneuropathy: Secondary | ICD-10-CM | POA: Diagnosis not present

## 2018-08-19 DIAGNOSIS — E1040 Type 1 diabetes mellitus with diabetic neuropathy, unspecified: Secondary | ICD-10-CM | POA: Diagnosis not present

## 2018-08-22 DIAGNOSIS — M791 Myalgia, unspecified site: Secondary | ICD-10-CM | POA: Diagnosis not present

## 2018-08-22 DIAGNOSIS — G43719 Chronic migraine without aura, intractable, without status migrainosus: Secondary | ICD-10-CM | POA: Diagnosis not present

## 2018-08-22 DIAGNOSIS — G518 Other disorders of facial nerve: Secondary | ICD-10-CM | POA: Diagnosis not present

## 2018-08-22 DIAGNOSIS — M542 Cervicalgia: Secondary | ICD-10-CM | POA: Diagnosis not present

## 2018-08-29 ENCOUNTER — Other Ambulatory Visit: Payer: Self-pay | Admitting: *Deleted

## 2018-08-29 MED ORDER — METHOTREXATE SODIUM CHEMO INJECTION 50 MG/2ML: 10.0000 mg | INTRAMUSCULAR | 0 refills | Status: DC

## 2018-08-29 NOTE — Telephone Encounter (Signed)
Last Visit: 04/13/18 Next visit: 09/19/18 Labs: 08/11/18 Creatinine is elevated. reduce methotrexate to 0.4 mL   Okay to refill per Dr. Corliss Skains

## 2018-09-05 ENCOUNTER — Telehealth: Payer: Self-pay | Admitting: Rheumatology

## 2018-09-05 NOTE — Telephone Encounter (Signed)
Patient advised regarding your medications we recommend you continue them at this time as prescribed. In the event you do become sick please follow recommended procedure to hold medication until symptoms have resolved.  We understand concerns about increased risk of infection. If you do choose to stop your medications understand you are putting yourself at risk for a flare. Please call the office for any questions or acute issues. We are allowing some flexibly for labs and follow up visit. If you are having current respiratory symptoms do not come to the office, please utilize e-visits and your PCP for appropriate assessment and testing.  

## 2018-09-05 NOTE — Telephone Encounter (Signed)
Patient called requesting a return call to let her know if she should continue taking her Enbrel and Methotrexate since she also has diabetes.  Patient is worried about her immune system if she gets the Roberts virus.

## 2018-09-08 DIAGNOSIS — E109 Type 1 diabetes mellitus without complications: Secondary | ICD-10-CM | POA: Diagnosis not present

## 2018-09-08 DIAGNOSIS — Z794 Long term (current) use of insulin: Secondary | ICD-10-CM | POA: Diagnosis not present

## 2018-09-17 DIAGNOSIS — E1040 Type 1 diabetes mellitus with diabetic neuropathy, unspecified: Secondary | ICD-10-CM | POA: Diagnosis not present

## 2018-09-19 ENCOUNTER — Ambulatory Visit: Payer: Medicare Other | Admitting: Physician Assistant

## 2018-10-03 DIAGNOSIS — M791 Myalgia, unspecified site: Secondary | ICD-10-CM | POA: Diagnosis not present

## 2018-10-03 DIAGNOSIS — G43719 Chronic migraine without aura, intractable, without status migrainosus: Secondary | ICD-10-CM | POA: Diagnosis not present

## 2018-10-03 DIAGNOSIS — G518 Other disorders of facial nerve: Secondary | ICD-10-CM | POA: Diagnosis not present

## 2018-10-03 DIAGNOSIS — M542 Cervicalgia: Secondary | ICD-10-CM | POA: Diagnosis not present

## 2018-10-10 ENCOUNTER — Telehealth: Payer: Self-pay | Admitting: Rheumatology

## 2018-10-10 MED ORDER — ETANERCEPT 50 MG/ML ~~LOC~~ SOAJ: 50.0000 mg | SUBCUTANEOUS | 0 refills | Status: DC

## 2018-10-10 NOTE — Telephone Encounter (Signed)
Last Visit: 04/13/2018 Next Visit: message sent to the front desk to schedule. Labs: 08/11/2018 Creatinine is elevated. Please have patient reduce methotrexate to 0.6 mL subcu weekly. TB Gold: 03/27/2018 negative   Okay to refill per Dr. Corliss Skains.

## 2018-10-10 NOTE — Telephone Encounter (Signed)
Patient needs a refill on Enbrel sent to [facility]. Patient requests a call when you send in rx so she can check up on them.

## 2018-10-10 NOTE — Telephone Encounter (Signed)
Prescription has been faxed to [facility], patient has been notified.

## 2018-10-11 ENCOUNTER — Telehealth: Payer: Self-pay | Admitting: Rheumatology

## 2018-10-11 NOTE — Telephone Encounter (Signed)
-----   Message from Ellen Henri, CMA sent at 10/10/2018 10:53 AM EDT ----- Please call to schedule follow up appointment. Patient is due now. Thanks!

## 2018-10-11 NOTE — Telephone Encounter (Signed)
I LMOM for patient to call, and schedule a follow up appt with Dr. Corliss Skains. Patient is due now.

## 2018-10-14 DIAGNOSIS — E1040 Type 1 diabetes mellitus with diabetic neuropathy, unspecified: Secondary | ICD-10-CM | POA: Diagnosis not present

## 2018-10-18 ENCOUNTER — Ambulatory Visit: Payer: Medicare Other | Admitting: Obstetrics and Gynecology

## 2018-11-08 DIAGNOSIS — E1040 Type 1 diabetes mellitus with diabetic neuropathy, unspecified: Secondary | ICD-10-CM | POA: Diagnosis not present

## 2018-11-14 DIAGNOSIS — M791 Myalgia, unspecified site: Secondary | ICD-10-CM | POA: Diagnosis not present

## 2018-11-14 DIAGNOSIS — M542 Cervicalgia: Secondary | ICD-10-CM | POA: Diagnosis not present

## 2018-11-14 DIAGNOSIS — G518 Other disorders of facial nerve: Secondary | ICD-10-CM | POA: Diagnosis not present

## 2018-11-14 DIAGNOSIS — G43719 Chronic migraine without aura, intractable, without status migrainosus: Secondary | ICD-10-CM | POA: Diagnosis not present

## 2018-11-23 DIAGNOSIS — Z20818 Contact with and (suspected) exposure to other bacterial communicable diseases: Secondary | ICD-10-CM | POA: Diagnosis not present

## 2018-11-23 DIAGNOSIS — R509 Fever, unspecified: Secondary | ICD-10-CM | POA: Diagnosis not present

## 2018-11-23 DIAGNOSIS — Z20828 Contact with and (suspected) exposure to other viral communicable diseases: Secondary | ICD-10-CM | POA: Diagnosis not present

## 2018-11-23 DIAGNOSIS — J029 Acute pharyngitis, unspecified: Secondary | ICD-10-CM | POA: Diagnosis not present

## 2018-11-30 ENCOUNTER — Other Ambulatory Visit: Payer: Self-pay

## 2018-11-30 DIAGNOSIS — Z79899 Other long term (current) drug therapy: Secondary | ICD-10-CM

## 2018-12-01 LAB — COMPLETE METABOLIC PANEL WITH GFR
AG Ratio: 2 (calc) (ref 1.0–2.5)
ALT: 20 U/L (ref 6–29)
AST: 31 U/L (ref 10–35)
Albumin: 4.3 g/dL (ref 3.6–5.1)
Alkaline phosphatase (APISO): 40 U/L (ref 37–153)
BUN: 18 mg/dL (ref 7–25)
CO2: 26 mmol/L (ref 20–32)
Calcium: 9.8 mg/dL (ref 8.6–10.4)
Chloride: 106 mmol/L (ref 98–110)
Creat: 0.91 mg/dL (ref 0.50–0.99)
GFR, Est African American: 78 mL/min/{1.73_m2} (ref >=60)
GFR, Est Non African American: 68 mL/min/{1.73_m2} (ref >=60)
Globulin: 2.2 g/dL (calc) (ref 1.9–3.7)
Glucose, Bld: 137 mg/dL — ABNORMAL HIGH (ref 65–99)
Potassium: 4.6 mmol/L (ref 3.5–5.3)
Sodium: 142 mmol/L (ref 135–146)
Total Bilirubin: 0.4 mg/dL (ref 0.2–1.2)
Total Protein: 6.5 g/dL (ref 6.1–8.1)

## 2018-12-01 LAB — CBC WITH DIFFERENTIAL/PLATELET
Absolute Monocytes: 408 cells/uL (ref 200–950)
Basophils Absolute: 72 cells/uL (ref 0–200)
Basophils Relative: 1.5 %
Eosinophils Absolute: 202 cells/uL (ref 15–500)
Eosinophils Relative: 4.2 %
HCT: 38.9 % (ref 35.0–45.0)
Hemoglobin: 13.2 g/dL (ref 11.7–15.5)
Lymphs Abs: 1080 cells/uL (ref 850–3900)
MCH: 31.3 pg (ref 27.0–33.0)
MCHC: 33.9 g/dL (ref 32.0–36.0)
MCV: 92.2 fL (ref 80.0–100.0)
MPV: 13.2 fL — ABNORMAL HIGH (ref 7.5–12.5)
Monocytes Relative: 8.5 %
Neutro Abs: 3038 cells/uL (ref 1500–7800)
Neutrophils Relative %: 63.3 %
Platelets: 235 10*3/uL (ref 140–400)
RBC: 4.22 10*6/uL (ref 3.80–5.10)
RDW: 14.3 % (ref 11.0–15.0)
Total Lymphocyte: 22.5 %
WBC: 4.8 10*3/uL (ref 3.8–10.8)

## 2018-12-08 DIAGNOSIS — R509 Fever, unspecified: Secondary | ICD-10-CM | POA: Diagnosis not present

## 2018-12-08 DIAGNOSIS — M069 Rheumatoid arthritis, unspecified: Secondary | ICD-10-CM | POA: Diagnosis not present

## 2018-12-08 DIAGNOSIS — N390 Urinary tract infection, site not specified: Secondary | ICD-10-CM | POA: Diagnosis not present

## 2018-12-08 DIAGNOSIS — E103493 Type 1 diabetes mellitus with severe nonproliferative diabetic retinopathy without macular edema, bilateral: Secondary | ICD-10-CM | POA: Diagnosis not present

## 2018-12-09 ENCOUNTER — Emergency Department (HOSPITAL_BASED_OUTPATIENT_CLINIC_OR_DEPARTMENT_OTHER): Payer: Medicare Other

## 2018-12-09 ENCOUNTER — Encounter (HOSPITAL_BASED_OUTPATIENT_CLINIC_OR_DEPARTMENT_OTHER): Payer: Self-pay

## 2018-12-09 ENCOUNTER — Other Ambulatory Visit: Payer: Self-pay

## 2018-12-09 ENCOUNTER — Inpatient Hospital Stay (HOSPITAL_BASED_OUTPATIENT_CLINIC_OR_DEPARTMENT_OTHER)
Admission: EM | Admit: 2018-12-09 | Discharge: 2018-12-13 | DRG: 689 | Disposition: A | Discharge status: Home / Self Care | Payer: Medicare Other | Attending: Internal Medicine | Admitting: Internal Medicine

## 2018-12-09 DIAGNOSIS — Z789 Other specified health status: Secondary | ICD-10-CM | POA: Diagnosis present

## 2018-12-09 DIAGNOSIS — N179 Acute kidney failure, unspecified: Secondary | ICD-10-CM | POA: Diagnosis not present

## 2018-12-09 DIAGNOSIS — Z20828 Contact with and (suspected) exposure to other viral communicable diseases: Secondary | ICD-10-CM | POA: Diagnosis not present

## 2018-12-09 DIAGNOSIS — N390 Urinary tract infection, site not specified: Secondary | ICD-10-CM | POA: Diagnosis not present

## 2018-12-09 DIAGNOSIS — E7800 Pure hypercholesterolemia, unspecified: Secondary | ICD-10-CM | POA: Diagnosis present

## 2018-12-09 DIAGNOSIS — E119 Type 2 diabetes mellitus without complications: Secondary | ICD-10-CM | POA: Diagnosis not present

## 2018-12-09 DIAGNOSIS — M0579 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 without organ or systems involvement: Secondary | ICD-10-CM | POA: Diagnosis not present

## 2018-12-09 DIAGNOSIS — IMO0001 Reserved for inherently not codable concepts without codable children: Secondary | ICD-10-CM

## 2018-12-09 DIAGNOSIS — Z9641 Presence of insulin pump (external) (internal): Secondary | ICD-10-CM | POA: Diagnosis not present

## 2018-12-09 DIAGNOSIS — E1142 Type 2 diabetes mellitus with diabetic polyneuropathy: Secondary | ICD-10-CM | POA: Diagnosis not present

## 2018-12-09 DIAGNOSIS — M810 Age-related osteoporosis without current pathological fracture: Secondary | ICD-10-CM | POA: Diagnosis present

## 2018-12-09 DIAGNOSIS — G9341 Metabolic encephalopathy: Secondary | ICD-10-CM | POA: Diagnosis not present

## 2018-12-09 DIAGNOSIS — Z7982 Long term (current) use of aspirin: Secondary | ICD-10-CM | POA: Diagnosis not present

## 2018-12-09 DIAGNOSIS — Z79899 Other long term (current) drug therapy: Secondary | ICD-10-CM

## 2018-12-09 DIAGNOSIS — Z1159 Encounter for screening for other viral diseases: Secondary | ICD-10-CM | POA: Diagnosis not present

## 2018-12-09 DIAGNOSIS — Z0389 Encounter for observation for other suspected diseases and conditions ruled out: Secondary | ICD-10-CM | POA: Diagnosis not present

## 2018-12-09 DIAGNOSIS — E1040 Type 1 diabetes mellitus with diabetic neuropathy, unspecified: Secondary | ICD-10-CM | POA: Diagnosis not present

## 2018-12-09 DIAGNOSIS — Z794 Long term (current) use of insulin: Secondary | ICD-10-CM

## 2018-12-09 DIAGNOSIS — R509 Fever, unspecified: Secondary | ICD-10-CM | POA: Diagnosis not present

## 2018-12-09 DIAGNOSIS — D84821 Immunodeficiency due to drugs: Secondary | ICD-10-CM

## 2018-12-09 LAB — CBC WITH DIFFERENTIAL/PLATELET
Abs Immature Granulocytes: 0.05 10*3/uL (ref 0.00–0.07)
Basophils Absolute: 0.1 10*3/uL (ref 0.0–0.1)
Basophils Relative: 1 %
Eosinophils Absolute: 0 10*3/uL (ref 0.0–0.5)
Eosinophils Relative: 0 %
HCT: 38 % (ref 36.0–46.0)
Hemoglobin: 12.4 g/dL (ref 12.0–15.0)
Immature Granulocytes: 1 %
Lymphocytes Relative: 15 %
Lymphs Abs: 0.7 10*3/uL (ref 0.7–4.0)
MCH: 30.2 pg (ref 26.0–34.0)
MCHC: 32.6 g/dL (ref 30.0–36.0)
MCV: 92.7 fL (ref 80.0–100.0)
Monocytes Absolute: 0.4 10*3/uL (ref 0.1–1.0)
Monocytes Relative: 7 %
Neutro Abs: 3.5 10*3/uL (ref 1.7–7.7)
Neutrophils Relative %: 76 %
Platelets: 140 10*3/uL — ABNORMAL LOW (ref 150–400)
RBC: 4.1 MIL/uL (ref 3.87–5.11)
RDW: 14.7 % (ref 11.5–15.5)
WBC: 4.7 10*3/uL (ref 4.0–10.5)
nRBC: 0 % (ref 0.0–0.2)

## 2018-12-09 LAB — PROTIME-INR
INR: 1 (ref 0.8–1.2)
Prothrombin Time: 13.3 seconds (ref 11.4–15.2)

## 2018-12-09 LAB — URINALYSIS, ROUTINE W REFLEX MICROSCOPIC
Bilirubin Urine: NEGATIVE
Glucose, UA: 250 mg/dL — AB
Ketones, ur: 40 mg/dL — AB
Nitrite: NEGATIVE
Protein, ur: NEGATIVE mg/dL
Specific Gravity, Urine: 1.015 (ref 1.005–1.030)
pH: 7 (ref 5.0–8.0)

## 2018-12-09 LAB — URINALYSIS, MICROSCOPIC (REFLEX)

## 2018-12-09 LAB — COMPREHENSIVE METABOLIC PANEL
ALT: 31 U/L (ref 0–44)
AST: 38 U/L (ref 15–41)
Albumin: 3.5 g/dL (ref 3.5–5.0)
Alkaline Phosphatase: 61 U/L (ref 38–126)
Anion gap: 12 (ref 5–15)
BUN: 12 mg/dL (ref 8–23)
CO2: 19 mmol/L — ABNORMAL LOW (ref 22–32)
Calcium: 8.7 mg/dL — ABNORMAL LOW (ref 8.9–10.3)
Chloride: 101 mmol/L (ref 98–111)
Creatinine, Ser: 0.81 mg/dL (ref 0.44–1.00)
GFR calc Af Amer: >60 mL/min (ref >60)
GFR calc non Af Amer: >60 mL/min (ref >60)
Glucose, Bld: 230 mg/dL — ABNORMAL HIGH (ref 70–99)
Potassium: 3.6 mmol/L (ref 3.5–5.1)
Sodium: 132 mmol/L — ABNORMAL LOW (ref 135–145)
Total Bilirubin: 0.5 mg/dL (ref 0.3–1.2)
Total Protein: 6.6 g/dL (ref 6.5–8.1)

## 2018-12-09 LAB — CBG MONITORING, ED: Glucose-Capillary: 213 mg/dL — ABNORMAL HIGH (ref 70–99)

## 2018-12-09 LAB — SARS CORONAVIRUS 2 AG (30 MIN TAT): SARS Coronavirus 2 Ag: NEGATIVE

## 2018-12-09 LAB — LACTIC ACID, PLASMA: Lactic Acid, Venous: 0.9 mmol/L (ref 0.5–1.9)

## 2018-12-09 MED ORDER — SODIUM CHLORIDE 0.9% FLUSH
3.0000 mL | Freq: Once | INTRAVENOUS | Status: AC
  Administered 2018-12-11: 3 mL via INTRAVENOUS
  Filled 2018-12-09: qty 3

## 2018-12-09 MED ORDER — ACETAMINOPHEN 325 MG PO TABS
650.0000 mg | ORAL_TABLET | Freq: Once | ORAL | Status: AC
  Administered 2018-12-09: 650 mg via ORAL
  Filled 2018-12-09: qty 2

## 2018-12-09 NOTE — ED Triage Notes (Signed)
Pt brought to the hospital by neighbor. Pt having confusion, fever, cough. Pt is a poor historian at this time.

## 2018-12-09 NOTE — ED Provider Notes (Signed)
[facility] Provider Note: Georgena Spurling, MD, FACEP  CSN: [id number] MRN: [id number] ARRIVAL: 12/09/18 at 2217 ROOM: [place]  Fever  Level 5 caveat: Confusion HISTORY OF PRESENT ILLNESS  12/09/18 10:53 PM Becky [name] is a 63 y.o. female who is here with fever to 102.8.  Although she is oriented to person, place and time she is confused as to recent events and her speech is rambling and repetitive.  She was brought by a neighbor because of confusion and fever.  She tells me she has been sick for 2 weeks with "my condition".  When asked what that condition was she said "that condition I have had".  This prompted her PCP to do a COVID test 2 weeks ago which was negative.  She states she has continued to be febrile for the past 2 weeks with low back pain and lower abdominal pain.  She was seen by her PCP yesterday who chose not to repeat a COVID test.  She was placed on trimethoprim/sulfamethoxazole for a UTI which apparently made her nauseated and she was placed on Macrobid today.  She is not currently nauseated and has not vomited.  She has had some mild diarrhea.  She denies chest pain, shortness of breath or cough.  She states she has "some" burning with urination.   Past Medical History:  Diagnosis Date  . Abnormal ECG   . Anemia   . Anxiety   . Cataract    . Cateracts bil eyes removed  . GERD (gastroesophageal reflux disease)   . Heart murmur   . Hypercholesterolemia   . IDDM (insulin dependent diabetes mellitus) ([place]) 1969  . Migraine headache   . Neuromuscular disorder (HCC)    bil legs & feet - neuropathy  . Neuropathy   . Osteoporosis    10/2012  . Otosclerosis   . RA (rheumatoid arthritis) ([place]) 2000  . S/P cardiac cath August 2013   Normal coronaries and normal LV function.  . Sjoegren syndrome     Past Surgical History:  Procedure Laterality Date  . BREAST LUMPECTOMY  1995  . CARPAL TUNNEL RELEASE  2000  . CATARACT  EXTRACTION Right 09-05-12  . CATARACT EXTRACTION Left 3/15  . CESAREAN SECTION  1987  . COLONOSCOPY    . EYE SURGERY     diabetic retinopathy   . HYSTEROSCOPY  02/2001   and D & C  . LYMPH NODE DISSECTION  2002  . TOE SURGERY  2000   bone removed    Family History  Problem Relation Age of Onset  . Breast cancer Mother 32  . Colon polyps Mother        unspecified number  . Uterine cancer Mother   . Breast cancer Maternal Grandmother 15  . Stroke Father 31  . Rheum arthritis Sister   . Breast cancer Sister 48       lump; (-)GT (breast/ova ca panel, GeneDx)  . Skin cancer Maternal Grandfather        unspecified type  . Prostate cancer Maternal Grandfather        dx. late 91s  . Breast cancer Other        dx. in their late 81s  . Breast cancer Other        dx. 78-79  . Breast cancer Cousin        bilateral; dx. late 74s  . Colon cancer Neg Hx   . Esophageal cancer Neg Hx   .  Rectal cancer Neg Hx   . Stomach cancer Neg Hx     Social History   Tobacco Use  . Smoking status: Never Smoker  . Smokeless tobacco: Never Used  Substance Use Topics  . Alcohol use: Yes    Alcohol/week: 4.0 standard drinks    Types: 2 Shots of liquor, 2 Standard drinks or equivalent per week    Comment: maybe 1-2 cocktails per wk  . Drug use: No    Prior to Admission medications   Medication Sig Start Date End Date Taking? Authorizing Provider  aspirin 81 MG tablet Take 81 mg by mouth daily.    [provider]  calcium carbonate (OS-CAL) 600 MG TABS Take 600 mg by mouth 2 (two) times daily with a meal.     [provider]  CONTOUR NEXT TEST test strip  10/03/17   [provider]  cyclobenzaprine (FLEXERIL) 10 MG tablet Take 10 mg by mouth 3 (three) times daily as needed for muscle spasms. Reported on 07/03/2015    [provider]  denosumab (PROLIA) 60 MG/ML SOSY injection Inject 60 mg into the skin every 6 (six) months. Please deliver to clinic 01/12/18    Bo Merino, MD  DULoxetine (CYMBALTA) 60 MG capsule Take 60 mg by mouth daily.    [provider]  etanercept (ENBREL SURECLICK) 50 MG/ML injection Inject 0.98 mLs (50 mg total) into the skin once a week. 10/10/18   Bo Merino, MD  folic acid (FOLVITE) 1 MG tablet Take 2 tablets (2 mg total) by mouth daily. 05/23/18   Bo Merino, MD  furosemide (LASIX) 20 MG tablet Take 20 mg by mouth every other day.    [provider]  insulin aspart (NOVOLOG) 100 UNIT/ML injection Insulin pump    [provider]  losartan (COZAAR) 25 MG tablet Take 50 mg by mouth daily.     [provider]  methotrexate 50 MG/2ML injection Inject 0.4 mLs (10 mg total) into the skin once a week. 08/29/18   Bo Merino, MD  Multiple Vitamin (MULTIVITAMIN) tablet Take 1 tablet by mouth daily.    [provider]  POLYETHYLENE GLYCOL 3350 PO Take by mouth 2 (two) times daily.     [provider]  pravastatin (PRAVACHOL) 20 MG tablet Take 40 mg by mouth daily.     [provider]  SUMAtriptan (IMITREX) 100 MG tablet Take 100 mg by mouth every 2 (two) hours as needed.    [provider]  topiramate (TOPAMAX) 200 MG tablet Take 200 mg by mouth 2 (two) times daily.    [provider]  Tuberculin-Allergy Syringes 27G X 1/2" 1 ML KIT Inject 1 Syringe into the skin once a week. To be used with weekly methotrexate injections. 12/01/17   Bo Merino, MD  valACYclovir (VALTREX) 1000 MG tablet Take 1 tablet (1,000 mg total) by mouth daily. 05/09/18   Magrinat, Virgie Dad, MD  vitamin B-12 (CYANOCOBALAMIN) 1000 MCG tablet Take 1,000 mcg by mouth daily.    [provider]    Allergies Ciprofloxacin and Penicillins   REVIEW OF SYSTEMS     PHYSICAL EXAMINATION  Initial Vital Signs Blood pressure (!) 159/61, pulse (!) 103, temperature (!) 102.8 F (39.3 C), temperature source Oral, resp. rate (!) 24, height '5\' 4"'  (1.626 m),  weight 56.2 kg, last menstrual period 09/19/2008, SpO2 98 %.  Examination General: Well-developed, well-nourished female in no acute distress; appearance consistent with age of record HENT: normocephalic; atraumatic Eyes:  pupils equal, round and reactive to light; extraocular muscles intact Neck: supple Heart: regular rate and rhythm; tachycardia Lungs: clear to auscultation bilaterally Abdomen: soft; nondistended; nontender; no masses or hepatosplenomegaly; bowel sounds present Extremities: No deformity; full range of motion; pulses normal Neurologic: Awake, alert and oriented; rambling, repetitive speech; motor function intact in all extremities and symmetric; no facial droop Skin: Warm and dry Psychiatric: Normal mood and affect   RESULTS  Summary of this visit's results, reviewed by myself:   EKG Interpretation  Date/Time:  Saturday December 09 2018 22:37:06 EDT Ventricular Rate:  102 PR Interval:    QRS Duration: 79 QT Interval:  326 QTC Calculation: 425 R Axis:   61 Text Interpretation:  Sinus tachycardia Borderline T abnormalities, inferior leads Baseline wander in lead(s) V3 No significant change was found Confirmed by Shanon Rosser [phone]) on 12/09/2018 10:47:32 PM      Laboratory Studies: Results for orders placed or performed during the hospital encounter of 12/09/18 (from the past 24 hour(s))  CBG monitoring, ED     Status: Abnormal   Collection Time: 12/09/18 10:31 PM  Result Value Ref Range   Glucose-Capillary 213 (H) 70 - 99 mg/dL  Urinalysis, Routine w reflex microscopic     Status: Abnormal   Collection Time: 12/09/18 10:33 PM  Result Value Ref Range   Color, Urine YELLOW YELLOW   APPearance CLEAR CLEAR   Specific Gravity, Urine 1.015 1.005 - 1.030   pH 7.0 5.0 - 8.0   Glucose, UA 250 (A) NEGATIVE mg/dL   Hgb urine dipstick TRACE (A) NEGATIVE   Bilirubin Urine NEGATIVE NEGATIVE   Ketones, ur 40 (A) NEGATIVE mg/dL   Protein, ur NEGATIVE NEGATIVE mg/dL    Nitrite NEGATIVE NEGATIVE   Leukocytes,Ua TRACE (A) NEGATIVE  Urinalysis, Microscopic (reflex)     Status: Abnormal   Collection Time: 12/09/18 10:33 PM  Result Value Ref Range   RBC / HPF 0-5 0 - 5 RBC/hpf   WBC, UA 0-5 0 - 5 WBC/hpf   Bacteria, UA FEW (A) NONE SEEN   Squamous Epithelial / LPF 0-5 0 - 5   Granular Casts, UA PRESENT   SARS Coronavirus 2 (Hosp order,Performed in [place] lab via Abbott ID)     Status: None   Collection Time: 12/09/18 10:36 PM   Specimen: Dry Nasal Swab (Abbott ID Now)  Result Value Ref Range   SARS Coronavirus 2 (Abbott ID Now) NEGATIVE NEGATIVE  Comprehensive metabolic panel     Status: Abnormal   Collection Time: 12/09/18 10:59 PM  Result Value Ref Range   Sodium 132 (L) 135 - 145 mmol/L   Potassium 3.6 3.5 - 5.1 mmol/L   Chloride 101 98 - 111 mmol/L   CO2 19 (L) 22 - 32 mmol/L   Glucose, Bld 230 (H) 70 - 99 mg/dL   BUN 12 8 - 23 mg/dL   Creatinine, Ser 0.81 0.44 - 1.00 mg/dL   Calcium 8.7 (L) 8.9 - 10.3 mg/dL   Total Protein 6.6 6.5 - 8.1 g/dL   Albumin 3.5 3.5 - 5.0 g/dL   AST 38 15 - 41 U/L   ALT 31 0 - 44 U/L   Alkaline Phosphatase 61 38 - 126 U/L   Total Bilirubin 0.5 0.3 - 1.2 mg/dL   GFR calc non Af Amer >60 >60 mL/min   GFR calc Af Amer >60 >60 mL/min   Anion gap 12 5 - 15  Lactic acid, plasma     Status: None  Collection Time: 12/09/18 10:59 PM  Result Value Ref Range   Lactic Acid, Venous 0.9 0.5 - 1.9 mmol/L  CBC with Differential     Status: Abnormal   Collection Time: 12/09/18 10:59 PM  Result Value Ref Range   WBC 4.7 4.0 - 10.5 K/uL   RBC 4.10 3.87 - 5.11 MIL/uL   Hemoglobin 12.4 12.0 - 15.0 g/dL   HCT 38.0 36.0 - 46.0 %   MCV 92.7 80.0 - 100.0 fL   MCH 30.2 26.0 - 34.0 pg   MCHC 32.6 30.0 - 36.0 g/dL   RDW 14.7 11.5 - 15.5 %   Platelets 140 (L) 150 - 400 K/uL   nRBC 0.0 0.0 - 0.2 %   Neutrophils Relative % 76 %   Neutro Abs 3.5 1.7 - 7.7 K/uL   Lymphocytes Relative 15 %   Lymphs Abs 0.7 0.7 - 4.0 K/uL    Monocytes Relative 7 %   Monocytes Absolute 0.4 0.1 - 1.0 K/uL   Eosinophils Relative 0 %   Eosinophils Absolute 0.0 0.0 - 0.5 K/uL   Basophils Relative 1 %   Basophils Absolute 0.1 0.0 - 0.1 K/uL   Immature Granulocytes 1 %   Abs Immature Granulocytes 0.05 0.00 - 0.07 K/uL  Protime-INR     Status: None   Collection Time: 12/09/18 10:59 PM  Result Value Ref Range   Prothrombin Time 13.3 11.4 - 15.2 seconds   INR 1.0 0.8 - 1.2   Imaging Studies: Dg Chest Port 1 View  Result Date: 12/09/2018 CLINICAL DATA:  Suspected sepsis EXAM: PORTABLE CHEST 1 VIEW COMPARISON:  09/15/2016 FINDINGS: The heart size and mediastinal contours are within normal limits. Both lungs are clear. The visualized skeletal structures are unremarkable. IMPRESSION: No active disease. Electronically Signed   By: Donavan Foil M.D.   On: 12/09/2018 23:43    ED COURSE and MDM  Nursing notes and initial vitals signs, including pulse oximetry, reviewed.  Vitals:   12/09/18 2346 12/10/18 0000 12/10/18 0030 12/10/18 0044  BP:  (!) 139/49 (!) 119/54   Pulse:  96 94   Resp:  19 19   Temp:    99.6 F (37.6 C)  TempSrc:    Oral  SpO2: 99% 99% 96%   Weight:      Height:       1:09 AM Will have patient admitted for fever of undetermined etiology and patient on chronic immunosuppressive drugs (Enbrel and methotrexate).  After discussion with Dr. Baird Cancer of the hospitalist service we will start her on Maxipime pending culture results.  Although she was recently treated for a urinary tract infection her urinalysis is not diagnostic of urinary tract infection at this time.  Urine has been sent for culture and Maxipime should cover most urinary tract pathogens.  PROCEDURES   CRITICAL CARE Performed by: Karen Chafe Slayter Moorhouse Total critical care time: 30 minutes Critical care time was exclusive of separately billable procedures and treating other patients. Critical care was necessary to treat or prevent imminent or life-threatening  deterioration. Critical care was time spent personally by me on the following activities: development of treatment plan with patient and/or surrogate as well as nursing, discussions with consultants, evaluation of patient's response to treatment, examination of patient, obtaining history from patient or surrogate, ordering and performing treatments and interventions, ordering and review of laboratory studies, ordering and review of radiographic studies, pulse oximetry and re-evaluation of patient's condition.   ED DIAGNOSES     ICD-10-CM  1. Febrile illness  R50.9   2. Fever  R50.9 DG Chest [facility] 1 View    DG Chest Port 1 View  3. 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

## 2018-12-09 NOTE — ED Notes (Signed)
Spoke with neighbors who brought pt in. Per neighbor pt has been sick with fever for 2 days. Pt was dx with a UTI and started on abx. Confusion started this evening which is when they brought her to the ED.

## 2018-12-10 DIAGNOSIS — N179 Acute kidney failure, unspecified: Secondary | ICD-10-CM | POA: Diagnosis not present

## 2018-12-10 DIAGNOSIS — Z1159 Encounter for screening for other viral diseases: Secondary | ICD-10-CM | POA: Diagnosis not present

## 2018-12-10 DIAGNOSIS — Z79899 Other long term (current) drug therapy: Secondary | ICD-10-CM | POA: Diagnosis not present

## 2018-12-10 DIAGNOSIS — Z7982 Long term (current) use of aspirin: Secondary | ICD-10-CM | POA: Diagnosis not present

## 2018-12-10 DIAGNOSIS — G9341 Metabolic encephalopathy: Secondary | ICD-10-CM | POA: Diagnosis present

## 2018-12-10 DIAGNOSIS — E7800 Pure hypercholesterolemia, unspecified: Secondary | ICD-10-CM | POA: Diagnosis present

## 2018-12-10 DIAGNOSIS — Z9641 Presence of insulin pump (external) (internal): Secondary | ICD-10-CM | POA: Diagnosis present

## 2018-12-10 DIAGNOSIS — R509 Fever, unspecified: Secondary | ICD-10-CM | POA: Diagnosis present

## 2018-12-10 DIAGNOSIS — E1142 Type 2 diabetes mellitus with diabetic polyneuropathy: Secondary | ICD-10-CM | POA: Diagnosis present

## 2018-12-10 DIAGNOSIS — Z789 Other specified health status: Secondary | ICD-10-CM | POA: Diagnosis not present

## 2018-12-10 DIAGNOSIS — Z794 Long term (current) use of insulin: Secondary | ICD-10-CM | POA: Diagnosis not present

## 2018-12-10 DIAGNOSIS — M0579 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 without organ or systems involvement: Secondary | ICD-10-CM | POA: Diagnosis present

## 2018-12-10 DIAGNOSIS — M810 Age-related osteoporosis without current pathological fracture: Secondary | ICD-10-CM | POA: Diagnosis present

## 2018-12-10 DIAGNOSIS — N390 Urinary tract infection, site not specified: Secondary | ICD-10-CM | POA: Diagnosis present

## 2018-12-10 LAB — GLUCOSE, CAPILLARY
Glucose-Capillary: 167 mg/dL — ABNORMAL HIGH (ref 70–99)
Glucose-Capillary: 126 mg/dL — ABNORMAL HIGH (ref 70–99)
Glucose-Capillary: 116 mg/dL — ABNORMAL HIGH (ref 70–99)

## 2018-12-10 LAB — CBG MONITORING, ED
Glucose-Capillary: 137 mg/dL — ABNORMAL HIGH (ref 70–99)
Glucose-Capillary: 218 mg/dL — ABNORMAL HIGH (ref 70–99)

## 2018-12-10 MED ORDER — PRAVASTATIN SODIUM 40 MG PO TABS
40.0000 mg | ORAL_TABLET | Freq: Every day | ORAL | Status: DC
  Administered 2018-12-10 – 2018-12-13 (×4): 40 mg via ORAL
  Filled 2018-12-10 (×4): qty 1

## 2018-12-10 MED ORDER — ACETAMINOPHEN 325 MG PO TABS
650.0000 mg | ORAL_TABLET | Freq: Once | ORAL | Status: AC
  Administered 2018-12-10: 650 mg via ORAL
  Filled 2018-12-10: qty 2

## 2018-12-10 MED ORDER — CEFEPIME HCL 2 G IJ SOLR
INTRAMUSCULAR | Status: AC
  Filled 2018-12-10: qty 2

## 2018-12-10 MED ORDER — VALACYCLOVIR HCL 500 MG PO TABS: 1000.0000 mg | ORAL_TABLET | Freq: Every day | ORAL | Status: DC

## 2018-12-10 MED ORDER — LOSARTAN POTASSIUM 50 MG PO TABS
50.0000 mg | ORAL_TABLET | Freq: Every day | ORAL | Status: DC
  Administered 2018-12-10 – 2018-12-13 (×4): 50 mg via ORAL
  Filled 2018-12-10 (×4): qty 1

## 2018-12-10 MED ORDER — TOPIRAMATE 25 MG PO TABS
200.0000 mg | ORAL_TABLET | Freq: Two times a day (BID) | ORAL | Status: DC
  Administered 2018-12-10 – 2018-12-13 (×6): 200 mg via ORAL
  Filled 2018-12-10 (×6): qty 8

## 2018-12-10 MED ORDER — CYCLOBENZAPRINE HCL 10 MG PO TABS: 10.0000 mg | ORAL_TABLET | Freq: Three times a day (TID) | ORAL | Status: DC | PRN

## 2018-12-10 MED ORDER — FOLIC ACID 1 MG PO TABS
2.0000 mg | ORAL_TABLET | Freq: Every day | ORAL | Status: DC
  Administered 2018-12-11 – 2018-12-13 (×3): 2 mg via ORAL
  Filled 2018-12-10 (×4): qty 2

## 2018-12-10 MED ORDER — DULOXETINE HCL 60 MG PO CPEP
60.0000 mg | ORAL_CAPSULE | Freq: Every day | ORAL | Status: DC
  Administered 2018-12-10 – 2018-12-13 (×4): 60 mg via ORAL
  Filled 2018-12-10 (×4): qty 1

## 2018-12-10 MED ORDER — SODIUM CHLORIDE 0.9 % IV BOLUS
1000.0000 mL | Freq: Once | INTRAVENOUS | Status: AC
  Administered 2018-12-10: 1000 mL via INTRAVENOUS

## 2018-12-10 MED ORDER — VITAMIN B-12 1000 MCG PO TABS
1000.0000 ug | ORAL_TABLET | Freq: Every day | ORAL | Status: DC
  Administered 2018-12-10 – 2018-12-13 (×4): 1000 ug via ORAL
  Filled 2018-12-10 (×4): qty 1

## 2018-12-10 MED ORDER — ASPIRIN EC 81 MG PO TBEC
81.0000 mg | DELAYED_RELEASE_TABLET | Freq: Every day | ORAL | Status: DC
  Administered 2018-12-11 – 2018-12-13 (×3): 81 mg via ORAL
  Filled 2018-12-10 (×4): qty 1

## 2018-12-10 MED ORDER — ASPIRIN 81 MG PO TABS: 81.0000 mg | ORAL_TABLET | Freq: Every day | ORAL | Status: DC

## 2018-12-10 MED ORDER — INSULIN ASPART 100 UNIT/ML ~~LOC~~ SOLN: 0.0000 [IU] | Freq: Every day | SUBCUTANEOUS | Status: DC

## 2018-12-10 MED ORDER — MAGNESIUM HYDROXIDE 400 MG/5ML PO SUSP
30.0000 mL | Freq: Once | ORAL | Status: AC
  Administered 2018-12-10: 30 mL via ORAL
  Filled 2018-12-10: qty 30

## 2018-12-10 MED ORDER — INSULIN ASPART 100 UNIT/ML ~~LOC~~ SOLN: 0.0000 [IU] | Freq: Three times a day (TID) | SUBCUTANEOUS | Status: DC

## 2018-12-10 MED ORDER — SODIUM CHLORIDE 0.9 % IV SOLN
2.0000 g | Freq: Three times a day (TID) | INTRAVENOUS | Status: DC
  Administered 2018-12-10 – 2018-12-13 (×9): 2 g via INTRAVENOUS
  Filled 2018-12-10 (×12): qty 2

## 2018-12-10 MED ORDER — IBUPROFEN 400 MG PO TABS
400.0000 mg | ORAL_TABLET | Freq: Once | ORAL | Status: AC
  Administered 2018-12-10: 400 mg via ORAL
  Filled 2018-12-10: qty 1

## 2018-12-10 MED ORDER — INSULIN PUMP
Freq: Three times a day (TID) | SUBCUTANEOUS | Status: DC
  Administered 2018-12-11: 02:00:00 via SUBCUTANEOUS
  Administered 2018-12-11 (×2): 1.4 via SUBCUTANEOUS
  Administered 2018-12-11: 1 via SUBCUTANEOUS
  Administered 2018-12-12: 2 via SUBCUTANEOUS
  Administered 2018-12-12: 3.4 via SUBCUTANEOUS
  Administered 2018-12-12: 1.1 via SUBCUTANEOUS
  Administered 2018-12-12: 8 via SUBCUTANEOUS
  Administered 2018-12-13 (×3): 0.5 via SUBCUTANEOUS
  Filled 2018-12-10: qty 1

## 2018-12-10 MED ORDER — ACETAMINOPHEN 325 MG PO TABS
650.0000 mg | ORAL_TABLET | Freq: Four times a day (QID) | ORAL | Status: DC | PRN
  Administered 2018-12-11: 650 mg via ORAL
  Filled 2018-12-10 (×2): qty 2

## 2018-12-10 MED ORDER — ENOXAPARIN SODIUM 40 MG/0.4ML ~~LOC~~ SOLN
40.0000 mg | SUBCUTANEOUS | Status: DC
  Administered 2018-12-10 – 2018-12-12 (×3): 40 mg via SUBCUTANEOUS
  Filled 2018-12-10 (×3): qty 0.4

## 2018-12-10 MED ORDER — SODIUM CHLORIDE 0.9 % IV SOLN
2.0000 g | Freq: Once | INTRAVENOUS | Status: AC
  Administered 2018-12-10: 2 g via INTRAVENOUS
  Filled 2018-12-10: qty 2

## 2018-12-10 NOTE — ED Notes (Signed)
Daughter updated with room number at [facility] and the plan of care per pt's request

## 2018-12-10 NOTE — ED Notes (Signed)
Contacted [facility] - patient needing transport.  [facility])  has one truck and they are backed up until after 7 am

## 2018-12-10 NOTE — Progress Notes (Signed)
Pharmacy Antibiotic Note  Becky [name] is a 63 y.o. female admitted on 12/09/2018 with febrile neutropenia. Past medical history significant for rheumatoid arthritis on Enbrel and methotrexate, history of diabetes mellitus on insulin pump, presenting with 2 weeks history of fever and altered mental status. Pharmacy has been consulted for Cefepime dosing. SCR 0.81  Plan: Start Cefepime 2g IV Q8hrs  Follow renal function and LOT  Height: 5\' 4"  (162.6 cm) Weight: 126 lb 8.7 oz (57.4 kg) IBW/kg (Calculated) : 54.7  Temp (24hrs), Avg:99.8 F (37.7 C), Min:98.1 F (36.7 C), Max:102.8 F (39.3 C)  Recent Labs  Lab 12/09/18 2259  WBC 4.7  CREATININE 0.81  LATICACIDVEN 0.9    Estimated Creatinine Clearance: 62.2 mL/min (by C-G formula based on SCr of 0.81 mg/dL).    Allergies  Allergen Reactions  . Macrobid [Nitrofurantoin Macrocrystal] Hives  . Ciprofloxacin Rash  . Penicillins Rash    Antimicrobials this admission: 6/21 cefepime >>     Dose adjustments this admission:   Microbiology results: 6/20 BCx:  COVID negative    Thank you for allowing pharmacy to be a part of this patient's care.   Juanell Fairly, PharmD PGY1 Pharmacy Resident 12/10/2018 2:20 PM

## 2018-12-10 NOTE — Progress Notes (Signed)
I spoke with Pt's spouse to give him update. All questions answered. He appreciated and  understood Pt's plan of care. No more questions at this time.   Kennyth Lose, RN

## 2018-12-10 NOTE — ED Notes (Signed)
[facility] called and stated they could not pick up patient if she was on [facility]'s list for transport

## 2018-12-10 NOTE — ED Notes (Signed)
Patient requested to have her CBG check.  CBG 218 mg/dl and she gave herself 1 unit via insulin pump.

## 2018-12-10 NOTE — ED Notes (Signed)
ED TO INPATIENT HANDOFF REPORT  ED Nurse Name and Phone #: Gilda Crease Name/Age/Gender Becky [name] 63 y.o. female Room/Bed: MH11/MH11  Code Status   Code Status: Not on file  Home/SNF/Other Home Patient oriented to: self, place and time Is this baseline? No   Triage Complete: Triage complete  Chief Complaint FEVER,BLADDER INFECTION  Triage Note Pt brought to the hospital by neighbor. Pt having confusion, fever, cough. Pt is a poor historian at this time.    Allergies Allergies  Allergen Reactions  . Ciprofloxacin Rash  . Penicillins Rash    Level of Care/Admitting Diagnosis ED Disposition    None      B Medical/Surgery History Past Medical History:  Diagnosis Date  . Abnormal ECG   . Anemia   . Anxiety   . Cataract    . Cateracts bil eyes removed  . GERD (gastroesophageal reflux disease)   . Heart murmur   . Hypercholesterolemia   . IDDM (insulin dependent diabetes mellitus) ([place]) 1969  . Migraine headache   . Neuromuscular disorder (HCC)    bil legs & feet - neuropathy  . Neuropathy   . Osteoporosis    10/2012  . Otosclerosis   . RA (rheumatoid arthritis) ([place]) 2000  . S/P cardiac cath August 2013   Normal coronaries and normal LV function.  . Sjoegren syndrome    Past Surgical History:  Procedure Laterality Date  . BREAST LUMPECTOMY  1995  . CARPAL TUNNEL RELEASE  2000  . CATARACT EXTRACTION Right 09-05-12  . CATARACT EXTRACTION Left 3/15  . CESAREAN SECTION  1987  . COLONOSCOPY    . EYE SURGERY     diabetic retinopathy   . HYSTEROSCOPY  02/2001   and D & C  . LYMPH NODE DISSECTION  2002  . TOE SURGERY  2000   bone removed     A IV Location/Drains/Wounds Patient Lines/Drains/Airways Status   Active Line/Drains/Airways    Name:   Placement date:   Placement time:   Site:   Days:   Peripheral IV 12/09/18 Left Antecubital   12/09/18    2313    Antecubital   1          Intake/Output Last 24 hours No intake or output data in  the 24 hours ending 12/10/18 0101  Labs/Imaging Results for orders placed or performed during the hospital encounter of 12/09/18 (from the past 48 hour(s))  CBG monitoring, ED     Status: Abnormal   Collection Time: 12/09/18 10:31 PM  Result Value Ref Range   Glucose-Capillary 213 (H) 70 - 99 mg/dL  Urinalysis, Routine w reflex microscopic     Status: Abnormal   Collection Time: 12/09/18 10:33 PM  Result Value Ref Range   Color, Urine YELLOW YELLOW   APPearance CLEAR CLEAR   Specific Gravity, Urine 1.015 1.005 - 1.030   pH 7.0 5.0 - 8.0   Glucose, UA 250 (A) NEGATIVE mg/dL   Hgb urine dipstick TRACE (A) NEGATIVE   Bilirubin Urine NEGATIVE NEGATIVE   Ketones, ur 40 (A) NEGATIVE mg/dL   Protein, ur NEGATIVE NEGATIVE mg/dL   Nitrite NEGATIVE NEGATIVE   Leukocytes,Ua TRACE (A) NEGATIVE    Comment: Performed at [facility], [address] [place], [place] [address]  Urinalysis, Microscopic (reflex)     Status: Abnormal   Collection Time: 12/09/18 10:33 PM  Result Value Ref Range   RBC / HPF 0-5 0 - 5 RBC/hpf   WBC,  UA 0-5 0 - 5 WBC/hpf   Bacteria, UA FEW (A) NONE SEEN   Squamous Epithelial / LPF 0-5 0 - 5   Granular Casts, UA PRESENT     Comment: Performed at [facility] [place], [address] [place], [place] [address]  SARS Coronavirus 2 (Hosp order,Performed in [facility] lab via [facility] ID)     Status: None   Collection Time: 12/09/18 10:36 PM   Specimen: Dry Nasal Swab (Abbott ID Now)  Result Value Ref Range   SARS Coronavirus 2 (Abbott ID Now) NEGATIVE NEGATIVE    Comment: (NOTE) SARS-CoV-2 target nucleic acids are NOT DETECTED. The SARS-CoV-2 RNA is generally detectable in upper and lower respiratory specimens during the acute phase of infection.  Negativeresults do not preclude SARS-CoV-2 infection, do not rule out coinfections with other pathogens, and should not be used as the  sole basis for treatment or other patient management decisions.   Negative results must be combined with clinical observations, patient history, and epidemiological information. The expected result is Negative. Fact Sheet for Patients: http://www.graves-ford.org/https://www.fda.gov/media/136524/download Fact Sheet for Healthcare Providers: En[employer]roConcern.sihttps://www.fda.gov/media/136523/download This test is not yet approved or cleared by the [place] FDA and  has been authorized for detection and/or diagnosis of SARS-CoV-2 by FDA under an Emergency Use Authorization (EUA).  This EUA will remain in effect (meaning this test can be used) for the duration of  the COVID19 declaration under Section 5 64(b)(1) of the Act, 21 U.S.C.  section [id number] 3(b)(1), unless the authorization is terminated or revoked sooner. Performed at [facility] [place], [address] [place], [place] [address]   Comprehensive metabolic panel     Status: Abnormal   Collection Time: 12/09/18 10:59 PM  Result Value Ref Range   Sodium 132 (L) 135 - 145 mmol/L   Potassium 3.6 3.5 - 5.1 mmol/L   Chloride 101 98 - 111 mmol/L   CO2 19 (L) 22 - 32 mmol/L   Glucose, Bld 230 (H) 70 - 99 mg/dL   BUN 12 8 - 23 mg/dL   Creatinine, Ser 1.470.81 0.44 - 1.00 mg/dL   Calcium 8.7 (L) 8.9 - 10.3 mg/dL   Total Protein 6.6 6.5 - 8.1 g/dL   Albumin 3.5 3.5 - 5.0 g/dL   AST 38 15 - 41 U/L   ALT 31 0 - 44 U/L   Alkaline Phosphatase 61 38 - 126 U/L   Total Bilirubin 0.5 0.3 - 1.2 mg/dL   GFR calc non Af Amer >60 >60 mL/min   GFR calc Af Amer >60 >60 mL/min   Anion gap 12 5 - 15    Comment: Performed at [facility] [place], [address] [place], [place] [address]  Lactic acid, plasma     Status: None   Collection Time: 12/09/18 10:59 PM  Result Value Ref Range   Lactic Acid, Venous 0.9 0.5 - 1.9 mmol/L    Comment: Performed at [facility] [place], [address] [place], [place] [address]  CBC with Differential     Status: Abnormal   Collection Time: 12/09/18 10:59 PM  Result Value Ref Range   WBC 4.7 4.0 -  10.5 K/uL   RBC 4.10 3.87 - 5.11 MIL/uL   Hemoglobin 12.4 12.0 - 15.0 g/dL   HCT 65.738.0 84.636.0 - 96.246.0 %   MCV 92.7 80.0 - 100.0 fL   MCH 30.2 26.0 - 34.0 pg   MCHC 32.6 30.0 - 36.0 g/dL  RDW 14.7 11.5 - 15.5 %   Platelets 140 (L) 150 - 400 K/uL   nRBC 0.0 0.0 - 0.2 %   Neutrophils Relative % 76 %   Neutro Abs 3.5 1.7 - 7.7 K/uL   Lymphocytes Relative 15 %   Lymphs Abs 0.7 0.7 - 4.0 K/uL   Monocytes Relative 7 %   Monocytes Absolute 0.4 0.1 - 1.0 K/uL   Eosinophils Relative 0 %   Eosinophils Absolute 0.0 0.0 - 0.5 K/uL   Basophils Relative 1 %   Basophils Absolute 0.1 0.0 - 0.1 K/uL   Immature Granulocytes 1 %   Abs Immature Granulocytes 0.05 0.00 - 0.07 K/uL    Comment: Performed at [facility], [address] [place], [place] [address]  Protime-INR     Status: None   Collection Time: 12/09/18 10:59 PM  Result Value Ref Range   Prothrombin Time 13.3 11.4 - 15.2 seconds   INR 1.0 0.8 - 1.2    Comment: (NOTE) INR goal varies based on device and disease states. Performed at [facility], [address] [place], [place] [address]    Dg Chest [place] 1 View  Result Date: 12/09/2018 CLINICAL DATA:  Suspected sepsis EXAM: PORTABLE CHEST 1 VIEW COMPARISON:  09/15/2016 FINDINGS: The heart size and mediastinal contours are within normal limits. Both lungs are clear. The visualized skeletal structures are unremarkable. IMPRESSION: No active disease. Electronically Signed   By: Jasmine Pang M.D.   On: 12/09/2018 23:43    Pending Labs Unresulted Labs (From admission, onward)    Start     Ordered   12/09/18 2322  Urine culture  ONCE - STAT,   STAT     12/09/18 2321   12/09/18 2233  Lactic acid, plasma  Now then every 2 hours,   STAT     12/09/18 2232   12/09/18 2233  Culture, blood (Routine x 2)  BLOOD CULTURE X 2,   STAT     12/09/18 2232          Vitals/Pain Today's Vitals   12/10/18 0000 12/10/18 0030 12/10/18 0044 12/10/18 0044  BP: (!) 139/49 (!)  119/54    Pulse: 96 94    Resp: 19 19    Temp:    99.6 F (37.6 C)  TempSrc:    Oral  SpO2: 99% 96%    Weight:      Height:      PainSc:   5      Isolation Precautions Droplet and Contact precautions  Medications Medications  sodium chloride flush (NS) 0.9 % injection 3 mL (has no administration in time range)  acetaminophen (TYLENOL) tablet 650 mg (650 mg Oral Given 12/09/18 2315)    Mobility walks High fall risk   Focused Assessments Neuro Assessment Handoff:  Swallow screen pass? not needed         Neuro Assessment:   Neuro Checks:      Last Documented NIHSS Modified Score:   Has TPA been given? No If patient is a Neuro Trauma and patient is going to OR before floor call report to 4N Charge nurse: [phone] or [phone]     R Recommendations: See Admitting Provider Note  Report given to:   Additional Notes: patient alert and appears oriented however poor historian and noted to ramble at times.

## 2018-12-10 NOTE — H&P (Addendum)
[facility]                                                                                    Patient Demographics  Becky [name], is a 63 y.o. female  CSN: [id number]  MRN: [id number]  DOB - 14-Mar-1956  Admit Date - 12/09/2018  Outpatient Primary MD for the patient is Reynold Bowen, MD   With History of -  Past Medical History:  Diagnosis Date  . Abnormal ECG   . Anemia   . Anxiety   . Cataract    . Cateracts bil eyes removed  . GERD (gastroesophageal reflux disease)   . Heart murmur   . Hypercholesterolemia   . IDDM (insulin dependent diabetes mellitus) ([place]) 1969  . Migraine headache   . Neuromuscular disorder (HCC)    bil legs & feet - neuropathy  . Neuropathy   . Osteoporosis    10/2012  . Otosclerosis   . RA (rheumatoid arthritis) ([place]) 2000  . S/P cardiac cath August 2013   Normal coronaries and normal LV function.  . Sjoegren syndrome       Past Surgical History:  Procedure Laterality Date  . BREAST LUMPECTOMY  1995  . CARPAL TUNNEL RELEASE  2000  . CATARACT EXTRACTION Right 09-05-12  . CATARACT EXTRACTION Left 3/15  . CESAREAN SECTION  1987  . COLONOSCOPY    . EYE SURGERY     diabetic retinopathy   . HYSTEROSCOPY  02/2001   and D & C  . LYMPH NODE DISSECTION  2002  . TOE SURGERY  2000   bone removed    in for   Chief Complaint  Patient presents with  . Fever     HPI  Becky [name]  is a 63 y.o. female, past medical history significant for rheumatoid arthritis on Enbrel and methotrexate, history of diabetes mellitus on insulin pump, presenting with 2 weeks history of fever and altered mental status.  Patient was treated twice for UTI with Bactrim and Macrobid, and did not improve.  Work-up in the emergency room yesterday was negative for pneumonia, UTI, and her CBC was benign.  Patient received Maxipime yesterday in the emergency room and is feeling a lot better today.  She is much less confused and alert awake oriented x3.   She has had no fever since Maxipime was given yesterday.  Patient reports that her main problem in the last 2 weeks was lower back pain in addition to the fever.      Review of Systems    In addition to the HPI above, No Fever-chills, No Headache, No changes with Vision or hearing, No problems swallowing food or Liquids, No Chest pain, Cough or Shortness of Breath, No Abdominal pain, No Nausea or Vommitting, Bowel movements are regular, No Blood in stool or Urine, No dysuria, No new skin rashes or bruises, No new joints pains-aches,  No new weakness, tingling, numbness in any extremity, No recent weight gain or loss, No polyuria, polydypsia or polyphagia, No significant Mental Stressors.  A full 10 point Review of Systems was done, except as stated above, all other Review of Systems were negative.  Social History Social History   Tobacco Use  . Smoking status: Never Smoker  . Smokeless tobacco: Never Used  Substance Use Topics  . Alcohol use: Yes    Alcohol/week: 4.0 standard drinks    Types: 2 Shots of liquor, 2 Standard drinks or equivalent per week    Comment: maybe 1-2 cocktails per wk     Family History Family History  Problem Relation Age of Onset  . Breast cancer Mother 93  . Colon polyps Mother        unspecified number  . Uterine cancer Mother   . Breast cancer Maternal Grandmother 82  . Stroke Father 32  . Rheum arthritis Sister   . Breast cancer Sister 48       lump; (-)GT (breast/ova ca panel, GeneDx)  . Skin cancer Maternal Grandfather        unspecified type  . Prostate cancer Maternal Grandfather        dx. late 59s  . Breast cancer Other        dx. in their late 19s  . Breast cancer Other        dx. 78-79  . Breast cancer Cousin        bilateral; dx. late 71s  . Colon cancer Neg Hx   . Esophageal cancer Neg Hx   . Rectal cancer Neg Hx   . Stomach cancer Neg Hx      Prior to Admission medications   Medication Sig Start Date End Date  Taking? Authorizing Provider  aspirin 81 MG tablet Take 81 mg by mouth daily.   Yes [provider]  calcium carbonate (OS-CAL) 600 MG TABS Take 600 mg by mouth 2 (two) times daily with a meal.    Yes [provider]  CONTOUR NEXT TEST test strip  10/03/17  Yes [provider]  DULoxetine (CYMBALTA) 60 MG capsule Take 60 mg by mouth daily.   Yes [provider]  etanercept (ENBREL SURECLICK) 50 MG/ML injection Inject 0.98 mLs (50 mg total) into the skin once a week. 10/10/18  Yes Deveshwar, Abel Presto, MD  folic acid (FOLVITE) 1 MG tablet Take 2 tablets (2 mg total) by mouth daily. 05/23/18  Yes Deveshwar, Abel Presto, MD  furosemide (LASIX) 20 MG tablet Take 20 mg by mouth every other day.   Yes [provider]  insulin aspart (NOVOLOG) 100 UNIT/ML injection Insulin pump   Yes [provider]  losartan (COZAAR) 25 MG tablet Take 50 mg by mouth daily.    Yes [provider]  methotrexate 50 MG/2ML injection Inject 0.4 mLs (10 mg total) into the skin once a week. 08/29/18  Yes Deveshwar, Abel Presto, MD  Multiple Vitamin (MULTIVITAMIN) tablet Take 1 tablet by mouth daily.   Yes [provider]  POLYETHYLENE GLYCOL 3350 PO Take by mouth 2 (two) times daily.    Yes [provider]  pravastatin (PRAVACHOL) 20 MG tablet Take 40 mg by mouth daily.    Yes [provider]  topiramate (TOPAMAX) 200 MG tablet Take 200 mg by mouth 2 (two) times daily.   Yes [provider]  vitamin B-12 (CYANOCOBALAMIN) 1000 MCG tablet Take 1,000 mcg by mouth daily.   Yes [provider]  cyclobenzaprine (FLEXERIL) 10 MG tablet Take 10 mg by mouth 3 (three) times daily as needed for muscle spasms. Reported on 07/03/2015    [provider]  denosumab (PROLIA) 60 MG/ML SOSY injection Inject 60 mg into the skin every 6 (six)  months. Please deliver to clinic 01/12/18   Bo Merino, MD  SUMAtriptan (IMITREX) 100 MG tablet Take  100 mg by mouth every 2 (two) hours as needed.    [provider]  Tuberculin-Allergy Syringes 27G X 1/2" 1 ML KIT Inject 1 Syringe into the skin once a week. To be used with weekly methotrexate injections. 12/01/17   Bo Merino, MD  valACYclovir (VALTREX) 1000 MG tablet Take 1 tablet (1,000 mg total) by mouth daily. 05/09/18   Magrinat, Virgie Dad, MD    Allergies  Allergen Reactions  . Macrobid [Nitrofurantoin Macrocrystal] Hives  . Ciprofloxacin Rash  . Penicillins Rash    Physical Exam  Vitals  Blood pressure (!) 131/58, pulse 82, temperature 98.1 F (36.7 C), temperature source Oral, resp. rate 14, height _0  (1.626 m), weight 57.4 kg, last menstrual period 09/19/2008, SpO2 99 %.   1. General well-developed, well-nourished elderly female, extremely pleasant  2. Normal affect and insight, Not Suicidal or Homicidal, Awake Alert, Oriented X 3.  3. No F.N deficits, grossly, patient moving all extremities.  4. Ears and Eyes appear Normal, Conjunctivae clear, PERRLA. Moist Oral Mucosa.  5. Supple Neck, No JVD,.  6. Symmetrical Chest wall movement, Good air movement bilaterally, CTAB.  7. RRR, No Gallops, Rubs or Murmurs, No Parasternal Heave.  8. Positive Bowel Sounds, Abdomen Soft, Non tender, No organomegaly appriciated,No rebound -guarding or rigidity.  9.  No Cyanosis, Normal Skin Turgor, No Skin Rash or Bruise.  10. Good muscle tone,  joints appear normal , no clubbing cyanosis or edema.    Data Review  CBC Recent Labs  Lab 12/09/18 2259  WBC 4.7  HGB 12.4  HCT 38.0  PLT 140*  MCV 92.7  MCH 30.2  MCHC 32.6  RDW 14.7  LYMPHSABS 0.7  MONOABS 0.4  EOSABS 0.0  BASOSABS 0.1   ------------------------------------------------------------------------------------------------------------------  Chemistries  Recent Labs  Lab 12/09/18 2259  NA 132*  K 3.6  CL 101  CO2 19*  GLUCOSE 230*  BUN 12  CREATININE 0.81  CALCIUM 8.7*  AST 38  ALT  31  ALKPHOS 61  BILITOT 0.5   ------------------------------------------------------------------------------------------------------------------ estimated creatinine clearance is 62.2 mL/min (by C-G formula based on SCr of 0.81 mg/dL). ------------------------------------------------------------------------------------------------------------------ No results for input(s): TSH, T4TOTAL, T3FREE, THYROIDAB in the last 72 hours.  Invalid input(s): FREET3   Coagulation profile Recent Labs  Lab 12/09/18 2259  INR 1.0   ------------------------------------------------------------------------------------------------------------------- No results for input(s): DDIMER in the last 72 hours. -------------------------------------------------------------------------------------------------------------------  Cardiac Enzymes No results for input(s): CKMB, TROPONINI, MYOGLOBIN in the last 168 hours.  Invalid input(s): CK ------------------------------------------------------------------------------------------------------------------ Invalid input(s): POCBNP   ---------------------------------------------------------------------------------------------------------------  Urinalysis    Component Value Date/Time   COLORURINE YELLOW 12/09/2018 [place] 12/09/2018 2233   LABSPEC 1.015 12/09/2018 2233   PHURINE 7.0 12/09/2018 2233   GLUCOSEU 250 (A) 12/09/2018 2233   HGBUR TRACE (A) 12/09/2018 2233   BILIRUBINUR NEGATIVE 12/09/2018 2233   BILIRUBINUR n 08/02/2013 1542   KETONESUR 40 (A) 12/09/2018 2233   PROTEINUR NEGATIVE 12/09/2018 2233   UROBILINOGEN negative 08/02/2013 1542   NITRITE NEGATIVE 12/09/2018 2233   LEUKOCYTESUR TRACE (A) 12/09/2018 2233    ----------------------------------------------------------------------------------------------------------------   Imaging results:   Dg Chest Port 1 View  Result Date: 12/09/2018 CLINICAL DATA:  Suspected  sepsis EXAM: PORTABLE CHEST 1 VIEW COMPARISON:  09/15/2016 FINDINGS: The heart size and mediastinal contours are within normal limits. Both lungs are clear. The visualized skeletal structures are unremarkable.  IMPRESSION: No active disease. Electronically Signed   By: Donavan Foil M.D.   On: 12/09/2018 23:43    My personal review of EKG: Sinus tach at 102 bpm with nonspecific T wave changes  Assessment & Plan  Fever, NOS -improving on cefepime Admit to telemetry Patient is on Enbrel/methotrexate for rheumatoid arthritis and is immunosuppressed Continue with cefepime awaiting cultures Patient was treated as outpatient for UTI due to dysuria with Bactrim and Macrobid.  Her urinalysis at this time is negative.?  Incomplete treatment of UTI as outpatient  Rheumatoid arthritis, Enbrel and methotrexate on hold due to acute infection  Diabetes mellitus on insulin pump Continue orders  DVT Prophylaxis Lovenox  AM Labs Ordered, also please review Full Orders  Code Status full  Disposition Plan: Home  Time spent in minutes : 42 minutes  Condition GUARDED   _0 @

## 2018-12-10 NOTE — Progress Notes (Signed)
RN Delsa Sale called at 1825 to place pt on radar list. Pt is a transfer from [facility], admitted for UTI. Pt reports acute onset of fever and low back pain. RN reports pt now tachycardic. MD paged prior to calling me. New orders for Tylenol 650 mg given and administered at 1817. Will place pt on radar list per RN request. This is not a rapid response call. Unable to provide additional orders, advised to page MD. No interventions from RRT.

## 2018-12-10 NOTE — ED Notes (Signed)
[facility] came to pick up patient.  Husband notified that patient left via ambulance.

## 2018-12-10 NOTE — ED Notes (Signed)
Spoke with the patient and I asked her if I can call her family member to give them an update.  She stated to call  her husband.  I called Mr. Becky [name] and left a message on his answering machine.

## 2018-12-10 NOTE — ED Notes (Signed)
Daughter inquiring about rash that has been on patient all day.  Patient did not disclose rash on arrival and wasn't noticeable.  Provider made aware.  Speaking with patient and daughter at this time.

## 2018-12-10 NOTE — ED Notes (Signed)
Spoke with Stanton Kidney at Patient Placement.  She is going to see what can be done to have a bed assigned for this patient.  At this time, there are 7 patients holding.

## 2018-12-10 NOTE — ED Notes (Signed)
Pt c/o back pain. States this is not new. Requesting tylenol. Dr. Florina Ou aware and orders received.

## 2018-12-10 NOTE — ED Notes (Signed)
Mr. Schwalbe informed that ambulance will be here in 30 minutes to pick up the patient.

## 2018-12-10 NOTE — ED Notes (Signed)
Report received 

## 2018-12-10 NOTE — ED Notes (Signed)
Awaiting transport to [facility]. Delay explained to pt.

## 2018-12-10 NOTE — ED Notes (Signed)
BS was 137. Pt has an insulin pump and states this reading does not need to be adjusted. Peanut butter crackers and diet coke given. Delay explained.

## 2018-12-10 NOTE — ED Notes (Signed)
Report given to [facility]

## 2018-12-10 NOTE — ED Notes (Signed)
Patient stated that she has been coughing for awhile and does not know why.  She denies smoking.  No coughing noted during this assessment.

## 2018-12-10 NOTE — Progress Notes (Signed)
Patient states she started feeling bad around 4pm.  Temp was 98.3.  At about 5:50 pt was noted to have shaking chills and temp was 102.8.  MD was paged.  Order for Tylenol administered.  Current temp at about 103.1. BP 161/60, HR 111.  Rapid Response called.  Will continue to monitor.

## 2018-12-11 DIAGNOSIS — G9341 Metabolic encephalopathy: Secondary | ICD-10-CM | POA: Diagnosis present

## 2018-12-11 DIAGNOSIS — Z789 Other specified health status: Secondary | ICD-10-CM | POA: Diagnosis present

## 2018-12-11 DIAGNOSIS — E1040 Type 1 diabetes mellitus with diabetic neuropathy, unspecified: Secondary | ICD-10-CM | POA: Diagnosis not present

## 2018-12-11 LAB — CBC
HCT: 34.8 % — ABNORMAL LOW (ref 36.0–46.0)
Hemoglobin: 11.9 g/dL — ABNORMAL LOW (ref 12.0–15.0)
MCH: 30.6 pg (ref 26.0–34.0)
MCHC: 34.2 g/dL (ref 30.0–36.0)
MCV: 89.5 fL (ref 80.0–100.0)
Platelets: 129 10*3/uL — ABNORMAL LOW (ref 150–400)
RBC: 3.89 MIL/uL (ref 3.87–5.11)
RDW: 14.8 % (ref 11.5–15.5)
WBC: 6 10*3/uL (ref 4.0–10.5)
nRBC: 0 % (ref 0.0–0.2)

## 2018-12-11 LAB — GLUCOSE, CAPILLARY
Glucose-Capillary: 156 mg/dL — ABNORMAL HIGH (ref 70–99)
Glucose-Capillary: 92 mg/dL (ref 70–99)
Glucose-Capillary: 138 mg/dL — ABNORMAL HIGH (ref 70–99)
Glucose-Capillary: 235 mg/dL — ABNORMAL HIGH (ref 70–99)
Glucose-Capillary: 197 mg/dL — ABNORMAL HIGH (ref 70–99)

## 2018-12-11 LAB — URINE CULTURE: Culture: NO GROWTH

## 2018-12-11 LAB — BASIC METABOLIC PANEL
Anion gap: 8 (ref 5–15)
BUN: 12 mg/dL (ref 8–23)
CO2: 22 mmol/L (ref 22–32)
Calcium: 8.6 mg/dL — ABNORMAL LOW (ref 8.9–10.3)
Chloride: 107 mmol/L (ref 98–111)
Creatinine, Ser: 0.99 mg/dL (ref 0.44–1.00)
GFR calc Af Amer: >60 mL/min (ref >60)
GFR calc non Af Amer: >60 mL/min (ref >60)
Glucose, Bld: 125 mg/dL — ABNORMAL HIGH (ref 70–99)
Potassium: 3.3 mmol/L — ABNORMAL LOW (ref 3.5–5.1)
Sodium: 137 mmol/L (ref 135–145)

## 2018-12-11 LAB — HIV ANTIBODY (ROUTINE TESTING W REFLEX): HIV Screen 4th Generation wRfx: NONREACTIVE

## 2018-12-11 MED ORDER — ONDANSETRON HCL 4 MG/2ML IJ SOLN
4.0000 mg | Freq: Four times a day (QID) | INTRAMUSCULAR | Status: DC | PRN
  Administered 2018-12-11 (×2): 4 mg via INTRAVENOUS
  Filled 2018-12-11 (×2): qty 2

## 2018-12-11 NOTE — Plan of Care (Signed)
Care plan reviewed: Pt is progressing.   Problem: Education: Goal: Knowledge of General Education information will improve Description: Including pain rating scale, medication(s)/side effects and non-pharmacologic comfort measures Outcome: Progressing   Problem: Clinical Measurements: Goal: Ability to maintain clinical measurements within normal limits will improve Outcome: Progressing   Problem: Clinical Measurements: Goal: Will remain free from infection Outcome: Progressing   Problem: Clinical Measurements: Goal: Respiratory complications will improve Outcome: Progressing   Problem: Clinical Measurements: Goal: Cardiovascular complication will be avoided Outcome: Progressing   Problem: Activity: Goal: Risk for activity intolerance will decrease Outcome: Progressing   Problem: Nutrition: Goal: Adequate nutrition will be maintained Outcome: Progressing   Problem: Pain Managment: Goal: General experience of comfort will improve Outcome: Progressing   Problem: Safety: Goal: Ability to remain free from injury will improve Outcome: Progressing   Problem: Skin Integrity: Goal: Risk for impaired skin integrity will decrease Outcome: Progressing  Kennyth Lose, RN

## 2018-12-11 NOTE — Progress Notes (Signed)
Inpatient Diabetes Program Recommendations  AACE/ADA: New Consensus Statement on Inpatient Glycemic Control (2015)  Target Ranges:  Prepandial:   less than 140 mg/dL      Peak postprandial:   less than 180 mg/dL (1-2 hours)      Critically ill patients:  140 - 180 mg/dL   Lab Results  Component Value Date   GLUCAP 138 (H) 12/11/2018    Review of Glycemic Control  Diabetes history: DM2 Outpatient Diabetes medications: Insulin pump - see settings below Current orders for Inpatient glycemic control: Insulin pump, Novolog 0-9 units tidwc and hs  Endo - Dr. Forde Dandy. Insulin pump settings:  Basal: Total 12.55 units/24H Bolus: CF - 70  Goal 120  CHO ratio - 1:15  Inpatient Diabetes Program Recommendations:     D/C Novolog 0-9 units tidwc and hs while on insulin pump  Secure text sent to RN regarding insulin pump assessment each shift, pump contract to be signed, sheet to pt for recording of boluses per insulin pump.  Pt states she's had her insulin pump for 28 years. Rarely has hypos and does have hypoglycemia awareness. Sees endo on regular basis. Has no questions regarding hospital policy for insulin pump in hospital.  Secure text to MD regarding insulin pump and diabetes control.  Will follow during hospitalization.  Thank you. Lorenda Peck, RD, LDN, CDE Inpatient Diabetes Coordinator [phone]

## 2018-12-11 NOTE — Progress Notes (Signed)
PROGRESS NOTE    Becky [name]  JIR:[id number] DOB: Mar 22, 1956 DOA: 12/09/2018 PCP: Reynold Bowen, MD   Brief Narrative:  Becky [name]  is a 63 y.o. female, past medical history significant for rheumatoid arthritis on Enbrel and methotrexate, history of diabetes mellitus on insulin pump, presenting with 2 weeks history of fever and acute altered mental status of a few hours.  Patient was treated twice for UTI with Bactrim and Macrobid, and did not improve.patient notes a skin rash with most recent antibiotic, nitrofurantoin.  Patient presented to the ED with altered mental status and fevers reported at home as high as 103. Of note she was also swabbed for strep throat and COVID within the past 2 weeks noted to be negative at PCP office. Work-up in the emergency room yesterday was unremarkable for overt UTI, pneumonia, or other infections and her CBC was benign.    Assessment & Plan:   Principal Problem:   Acute metabolic encephalopathy Active Problems:   IDDM (insulin dependent diabetes mellitus) (HCC)   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 without organ or systems involvement (HCC)   Fever   Failure of outpatient treatment   Abnormal urinalysis  Recurrent fever, not otherwise specified, concerning for failure of outpatient treatment of UTI, POA  -Previously treated with Bactrim and nitrofurantoin in the outpatient setting, could not tolerate Bactrim had questionable rash with nitrofurantoin -Continue cefepime to cover UTI and continue surveillance for other possible infectious processes -Patient continues to have fever overnight as high as 103, without leukocytosis   Acute metabolic encephalopathy, likely in the setting of infectious process as above,  resolved, POA -Resolved, patient back to baseline A&O x4 -Continue cefepime as below for treatment of possible UTI versus a unidentified infectious process  Immunocompromise state history of rheumatoid  arthritis, controlled -Continue home medications  Insulin dependent diabetes mellitus type 2, on insulin pump, well-controlled -Continue home insulin pump, sliding scale insulin hypoglycemic protocol  DVT prophylaxis: Lovenox Code Status: Full Family Communication: Over the phone Disposition Plan: Tentative disposition in the next 24 to 48 hours pending clinical course, improvement suspected now on IV antibiotics, mental status resolving still no clear source for infection although UTI is suspected we will continue to follow continue ensure no ongoing infectious process given her immunocompromise state  Antimicrobials:  Cefepime  Subjective: Continues to have fevers overnight, continues to complain of left lower back pain, not midline, tender to palpation; otherwise declines dysuria, hesitancy urgency frequency cough, sputum production, diarrhea, constipation, chest pain, shortness of breath, headache.  Objective: Vitals:   12/11/18 0611 12/11/18 0700 12/11/18 0844 12/11/18 0900  BP:   (!) 134/55   Pulse: 90 (!) 29 95   Resp: 20 (!) 30 19 (!) 28  Temp: 99.2 F (37.3 C)  98.8 F (37.1 C)   TempSrc: Oral  Oral   SpO2: 94% (!) 85% 100%   Weight:      Height:        Intake/Output Summary (Last 24 hours) at 12/11/2018 1401 Last data filed at 12/11/2018 0845 Gross per 24 hour  Intake 1008 ml  Output 3 ml  Net 1005 ml   Filed Weights   12/09/18 2228 12/10/18 1216  Weight: 56.2 kg 57.4 kg    Examination:  General exam: Appears calm and comfortable  Respiratory system: Clear to auscultation. Respiratory effort normal. Cardiovascular system: S1 & S2 heard, RRR. No JVD, murmurs, rubs, gallops or clicks. No pedal edema. Gastrointestinal system: Abdomen is nondistended, soft  and nontender. No organomegaly or masses felt. Normal bowel sounds heard. Central nervous system: Alert and oriented. No focal neurological deficits. Extremities: Symmetric 5 x 5 power. Skin: No rashes,  lesions or ulcers Psychiatry: Judgement and insight appear normal. Mood & affect appropriate.   Data Reviewed: I have personally reviewed following labs and imaging studies  CBC: Recent Labs  Lab 12/09/18 2259 12/11/18 0423  WBC 4.7 6.0  NEUTROABS 3.5  --   HGB 12.4 11.9*  HCT 38.0 34.8*  MCV 92.7 89.5  PLT 140* 129*   Basic Metabolic Panel: Recent Labs  Lab 12/09/18 2259 12/11/18 0423  NA 132* 137  K 3.6 3.3*  CL 101 107  CO2 19* 22  GLUCOSE 230* 125*  BUN 12 12  CREATININE 0.81 0.99  CALCIUM 8.7* 8.6*   GFR: Estimated Creatinine Clearance: 50.9 mL/min (by C-G formula based on SCr of 0.99 mg/dL). Liver Function Tests: Recent Labs  Lab 12/09/18 2259  AST 38  ALT 31  ALKPHOS 61  BILITOT 0.5  PROT 6.6  ALBUMIN 3.5   No results for input(s): LIPASE, AMYLASE in the last 168 hours. No results for input(s): AMMONIA in the last 168 hours. Coagulation Profile: Recent Labs  Lab 12/09/18 2259  INR 1.0   Cardiac Enzymes: No results for input(s): CKTOTAL, CKMB, CKMBINDEX, TROPONINI in the last 168 hours. BNP (last 3 results) No results for input(s): PROBNP in the last 8760 hours. HbA1C: No results for input(s): HGBA1C in the last 72 hours. CBG: Recent Labs  Lab 12/10/18 1656 12/10/18 2132 12/11/18 0158 12/11/18 0602 12/11/18 1114  GLUCAP 126* 116* 156* 92 138*   Lipid Profile: No results for input(s): CHOL, HDL, LDLCALC, TRIG, CHOLHDL, LDLDIRECT in the last 72 hours. Thyroid Function Tests: No results for input(s): TSH, T4TOTAL, FREET4, T3FREE, THYROIDAB in the last 72 hours. Anemia Panel: No results for input(s): VITAMINB12, FOLATE, FERRITIN, TIBC, IRON, RETICCTPCT in the last 72 hours. Sepsis Labs: Recent Labs  Lab 12/09/18 2259  LATICACIDVEN 0.9    Recent Results (from the past 240 hour(s))  Urine culture     Status: None   Collection Time: 12/09/18 10:33 PM   Specimen: Urine, Random  Result Value Ref Range Status   Specimen Description    Final    URINE, RANDOM Performed at [facility], [address] [place], [place] [address]    Special Requests   Final    NONE Performed at [facility], [address] [place], [place] [address]    Culture   Final    NO GROWTH Performed at [facility], [address] [place], [place] [address]    Report Status 12/11/2018 FINAL  Final  SARS Coronavirus 2 (Hosp order,Performed in [facility] lab via Abbott ID)     Status: None   Collection Time: 12/09/18 10:36 PM   Specimen: Dry Nasal Swab (Abbott ID Now)  Result Value Ref Range Status   SARS Coronavirus 2 (Abbott ID Now) NEGATIVE NEGATIVE Final    Comment: (NOTE) SARS-CoV-2 target nucleic acids are NOT DETECTED. The SARS-CoV-2 RNA is generally detectable in upper and lower respiratory specimens during the acute phase of infection.  Negativeresults do not preclude SARS-CoV-2 infection, do not rule out coinfections with other pathogens, and should not be used as the  sole basis for treatment or other patient management decisions.  Negative results must be combined with clinical observations, patient history, and epidemiological information. The expected result is  Negative. Fact Sheet for Patients: http://www.graves-ford.org/https://www.fda.gov/media/136524/download Fact Sheet for Healthcare Providers: EnviroConcern.sihttps://www.fda.gov/media/136523/download This test is not yet approved or cleared by the [place] FDA and  has been authorized for detection and/or diagnosis of SARS-CoV-2 by FDA under an Emergency Use Authorization (EUA).  This EUA will remain in effect (meaning this test can be used) for the duration of  the COVID19 declaration under Section 5 64(b)(1) of the Act, 21 U.S.C.  section 684 408 [id number] 3(b)(1), unless the authorization is terminated or revoked sooner. Performed at [facility] [place], [address] [place], [place] [address]   Culture, blood (Routine x 2)     Status: None (Preliminary result)    Collection Time: 12/09/18 10:45 PM   Specimen: BLOOD  Result Value Ref Range Status   Specimen Description   Final    BLOOD LEFT ANTECUBITAL Performed at [facility] [place] [facility], [address] [place], [place] [address]    Special Requests   Final    BOTTLES DRAWN AEROBIC AND ANAEROBIC Blood Culture adequate volume Performed at [facility] [place], [address] [place], [place] [address]    Culture   Final    NO GROWTH < 24 HOURS Performed at [facility], [address] [place], [place] [address]    Report Status PENDING  Incomplete  Culture, blood (Routine x 2)     Status: None (Preliminary result)   Collection Time: 12/09/18 11:00 PM   Specimen: BLOOD RIGHT ARM  Result Value Ref Range Status   Specimen Description   Final    BLOOD RIGHT ARM Performed at [facility] [place] [facility], [address] [place], [place] [address]    Special Requests   Final    BOTTLES DRAWN AEROBIC AND ANAEROBIC Blood Culture adequate volume Performed at [facility] [place], [address] [place], [place] [address]    Culture   Final    NO GROWTH < 24 HOURS Performed at [facility], [address] [place], [place] [address]    Report Status PENDING  Incomplete    Radiology Studies: Dg Chest Port 1 View  Result Date: 12/09/2018 CLINICAL DATA:  Suspected sepsis EXAM: PORTABLE CHEST 1 VIEW COMPARISON:  09/15/2016 FINDINGS: The heart size and mediastinal contours are within normal limits. Both lungs are clear. The visualized skeletal structures are unremarkable. IMPRESSION: No active disease. Electronically Signed   By: Jasmine PangKim  Fujinaga M.D.   On: 12/09/2018 23:43   Scheduled Meds: . aspirin EC  81 mg Oral Daily  . DULoxetine  60 mg Oral Daily  . enoxaparin (LOVENOX) injection  40 mg Subcutaneous Q24H  . folic acid  2 mg Oral Daily  . insulin aspart  0-5 Units Subcutaneous QHS  . insulin aspart  0-9 Units Subcutaneous TID WC  . insulin pump   Subcutaneous TID AC, HS, 0200   . losartan  50 mg Oral Daily  . pravastatin  40 mg Oral Daily  . topiramate  200 mg Oral BID  . vitamin B-12  1,000 mcg Oral Daily   Continuous Infusions: . ceFEPime (MAXIPIME) IV 2 g (12/11/18 0609)     LOS: 1 day   Time spent: 35min  Azucena FallenWilliam C Fraser Busche, DO [facility]  If 7PM-7AM, please contact night-coverage [email or web address] Password [email or web address] 12/11/2018, 2:01 PM

## 2018-12-12 LAB — GLUCOSE, CAPILLARY
Glucose-Capillary: 185 mg/dL — ABNORMAL HIGH (ref 70–99)
Glucose-Capillary: 291 mg/dL — ABNORMAL HIGH (ref 70–99)
Glucose-Capillary: 240 mg/dL — ABNORMAL HIGH (ref 70–99)
Glucose-Capillary: 190 mg/dL — ABNORMAL HIGH (ref 70–99)
Glucose-Capillary: 211 mg/dL — ABNORMAL HIGH (ref 70–99)

## 2018-12-12 LAB — BASIC METABOLIC PANEL
Anion gap: 14 (ref 5–15)
BUN: 13 mg/dL (ref 8–23)
CO2: 17 mmol/L — ABNORMAL LOW (ref 22–32)
Calcium: 8.4 mg/dL — ABNORMAL LOW (ref 8.9–10.3)
Chloride: 100 mmol/L (ref 98–111)
Creatinine, Ser: 1.08 mg/dL — ABNORMAL HIGH (ref 0.44–1.00)
GFR calc Af Amer: >60 mL/min (ref >60)
GFR calc non Af Amer: 55 mL/min — ABNORMAL LOW (ref >60)
Glucose, Bld: 308 mg/dL — ABNORMAL HIGH (ref 70–99)
Potassium: 3.8 mmol/L (ref 3.5–5.1)
Sodium: 131 mmol/L — ABNORMAL LOW (ref 135–145)

## 2018-12-12 LAB — CBC
HCT: 33.9 % — ABNORMAL LOW (ref 36.0–46.0)
Hemoglobin: 11.4 g/dL — ABNORMAL LOW (ref 12.0–15.0)
MCH: 30.6 pg (ref 26.0–34.0)
MCHC: 33.6 g/dL (ref 30.0–36.0)
MCV: 91.1 fL (ref 80.0–100.0)
Platelets: 144 10*3/uL — ABNORMAL LOW (ref 150–400)
RBC: 3.72 MIL/uL — ABNORMAL LOW (ref 3.87–5.11)
RDW: 14.9 % (ref 11.5–15.5)
WBC: 8.1 10*3/uL (ref 4.0–10.5)
nRBC: 0 % (ref 0.0–0.2)

## 2018-12-12 NOTE — Progress Notes (Signed)
PROGRESS NOTE    Becky [name]  FYB:[id number] DOB: 01/07/1956 DOA: 12/09/2018 PCP: Adrian Prince, MD   Brief Narrative:  Becky [name]  is a 63 y.o. female, past medical history significant for rheumatoid arthritis on Enbrel and methotrexate, history of diabetes mellitus on insulin pump, presenting with 2 weeks history of fever and acute altered mental status of a few hours.  Patient was treated twice for UTI with Bactrim and Macrobid, and did not improve.patient notes a skin rash with most recent antibiotic, nitrofurantoin.  Patient presented to the ED with altered mental status and fevers reported at home as high as 103. Of note she was also swabbed for strep throat and COVID within the past 2 weeks noted to be negative at PCP office. Work-up in the emergency room yesterday was unremarkable for overt UTI, pneumonia, or other infections and her CBC was benign.   Assessment & Plan:   Principal Problem:   Acute metabolic encephalopathy Active Problems:   IDDM (insulin dependent diabetes mellitus) (HCC)   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 without organ or systems involvement (HCC)   Fever   Failure of outpatient treatment   Abnormal urinalysis  Recurrent fever, not otherwise specified, concerning for failure of outpatient treatment of UTI, POA -Previously treated with Bactrim and nitrofurantoin in the outpatient setting, could not tolerate Bactrim; had questionable rash with nitrofurantoin -Continue cefepime to cover UTI and continue surveillance for other possible infectious processes -Patient now afebrile - no overt source still - ROS essentially unremarkable   AKI, mild; likely in the setting of poor PO intake vs ?UTI as above -Continue to encourage PO intake, follow am labs  Acute metabolic encephalopathy, likely in the setting of infectious process as above,  resolved, POA -Resolved, patient back to baseline A&O x4 -Continue cefepime as below for  treatment of possible UTI versus a unidentified infectious process  Immunocompromise state history of rheumatoid arthritis, controlled -Continue medications at discharge (methotrexate injection, enbrel)   Insulin dependent diabetes mellitus type 2, on insulin pump, well-controlled -Continue home insulin pump, sliding scale insulin hypoglycemic protocol  DVT prophylaxis: Lovenox Code Status: Full Disposition Plan: Tentative disposition in the next 24 to 48 hours pending clinical course, and ongoing improvement continue to suspect UTI continues now on IV antibiotics. Mental status resolving still no clear source follow cultures to ensure no ongoing infectious process given her immunocompromise state  Antimicrobials:  Cefepime initiated 12/10/18  Subjective: Indicates no ongoing back/flank pain, not midline, tender to palpation; otherwise declines dysuria, hesitancy urgency frequency cough, sputum production, diarrhea, constipation, chest pain, shortness of breath, headache.  Objective: Vitals:   12/11/18 2042 12/12/18 0404 12/12/18 0900 12/12/18 1427  BP: (!) 142/61 (!) 131/44 (!) 123/52 (!) 124/58  Pulse: 96 (!) 104    Resp: 18 18    Temp: 99.6 F (37.6 C) 99 F (37.2 C)    TempSrc: Oral Oral    SpO2: 95% 95%  100%  Weight:      Height:        Intake/Output Summary (Last 24 hours) at 12/12/2018 1502 Last data filed at 12/12/2018 0901 Gross per 24 hour  Intake 240 ml  Output 2 ml  Net 238 ml   Filed Weights   12/09/18 2228 12/10/18 1216  Weight: 56.2 kg 57.4 kg    Examination:  General:  Pleasantly resting in bed, No acute distress. HEENT:  Normocephalic atraumatic.  Sclerae nonicteric, noninjected.  Extraocular movements intact bilaterally. Neck:  Without mass  or deformity.  Trachea is midline. Lungs:  Clear to auscultate bilaterally without rhonchi, wheeze, or rales. Heart:  Regular rate and rhythm.  Without murmurs, rubs, or gallops. Abdomen:  Soft, nontender,  nondistended.  Without guarding or rebound. Extremities: Without cyanosis, clubbing, edema, or obvious deformity. Vascular:  Dorsalis pedis and posterior tibial pulses palpable bilaterally. Skin:  Warm and dry, no erythema, no ulcerations.  Data Reviewed: I have personally reviewed following labs and imaging studies  CBC: Recent Labs  Lab 12/09/18 2259 12/11/18 0423 12/12/18 0447  WBC 4.7 6.0 8.1  NEUTROABS 3.5  --   --   HGB 12.4 11.9* 11.4*  HCT 38.0 34.8* 33.9*  MCV 92.7 89.5 91.1  PLT 140* 129* 144*   Basic Metabolic Panel: Recent Labs  Lab 12/09/18 2259 12/11/18 0423 12/12/18 0447  NA 132* 137 131*  K 3.6 3.3* 3.8  CL 101 107 100  CO2 19* 22 17*  GLUCOSE 230* 125* 308*  BUN 12 12 13   CREATININE 0.81 0.99 1.08*  CALCIUM 8.7* 8.6* 8.4*   GFR: Estimated Creatinine Clearance: 46.6 mL/min (A) (by C-G formula based on SCr of 1.08 mg/dL (H)). Liver Function Tests: Recent Labs  Lab 12/09/18 2259  AST 38  ALT 31  ALKPHOS 61  BILITOT 0.5  PROT 6.6  ALBUMIN 3.5   No results for input(s): LIPASE, AMYLASE in the last 168 hours. No results for input(s): AMMONIA in the last 168 hours. Coagulation Profile: Recent Labs  Lab 12/09/18 2259  INR 1.0   Cardiac Enzymes: No results for input(s): CKTOTAL, CKMB, CKMBINDEX, TROPONINI in the last 168 hours. BNP (last 3 results) No results for input(s): PROBNP in the last 8760 hours. HbA1C: No results for input(s): HGBA1C in the last 72 hours. CBG: Recent Labs  Lab 12/11/18 1643 12/11/18 2042 12/12/18 0211 12/12/18 0626 12/12/18 1126  GLUCAP 235* 197* 185* 291* 240*   Lipid Profile: No results for input(s): CHOL, HDL, LDLCALC, TRIG, CHOLHDL, LDLDIRECT in the last 72 hours. Thyroid Function Tests: No results for input(s): TSH, T4TOTAL, FREET4, T3FREE, THYROIDAB in the last 72 hours. Anemia Panel: No results for input(s): VITAMINB12, FOLATE, FERRITIN, TIBC, IRON, RETICCTPCT in the last 72 hours. Sepsis Labs:  Recent Labs  Lab 12/09/18 2259  LATICACIDVEN 0.9    Recent Results (from the past 240 hour(s))  Urine culture     Status: None   Collection Time: 12/09/18 10:33 PM   Specimen: Urine, Random  Result Value Ref Range Status   Specimen Description   Final    URINE, RANDOM Performed at [facility], [address] [place], [place] [address]    Special Requests   Final    NONE Performed at [facility], [address] [place], [place] [address]    Culture   Final    NO GROWTH Performed at [facility], [address], [place], [place] [address]    Report Status 12/11/2018 FINAL  Final  SARS Coronavirus 2 (Hosp order,Performed in [facility] lab via Abbott ID)     Status: None   Collection Time: 12/09/18 10:36 PM   Specimen: Dry Nasal Swab (Abbott ID Now)  Result Value Ref Range Status   SARS Coronavirus 2 (Abbott ID Now) NEGATIVE NEGATIVE Final    Comment: (NOTE) SARS-CoV-2 target nucleic acids are NOT DETECTED. The SARS-CoV-2 RNA is generally detectable in upper and lower respiratory specimens during the acute phase of infection.  Negativeresults do not preclude SARS-CoV-2  infection, do not rule out coinfections with other pathogens, and should not be used as the  sole basis for treatment or other patient management decisions.  Negative results must be combined with clinical observations, patient history, and epidemiological information. The expected result is Negative. Fact Sheet for Patients: GolfingFamily.no Fact Sheet for Healthcare Providers: https://www.hernandez-brewer.com/ This test is not yet approved or cleared by the [place] FDA and  has been authorized for detection and/or diagnosis of SARS-CoV-2 by FDA under an Emergency Use Authorization (EUA).  This EUA will remain in effect (meaning this test can be used) for the duration of  the COVID19 declaration under Section 5 64(b)(1) of the Act, 21  U.S.C.  section 4026909370 3(b)(1), unless the authorization is terminated or revoked sooner. Performed at [facility], [address] [place], [place] [address]   Culture, blood (Routine x 2)     Status: None (Preliminary result)   Collection Time: 12/09/18 10:45 PM   Specimen: BLOOD  Result Value Ref Range Status   Specimen Description   Final    BLOOD LEFT ANTECUBITAL Performed at [facility], [address] [place], [place] [address]    Special Requests   Final    BOTTLES DRAWN AEROBIC AND ANAEROBIC Blood Culture adequate volume Performed at [facility], [address] [place], [place] [address]    Culture   Final    NO GROWTH 2 DAYS Performed at [facility], [place] [address] [place], [place] [address]    Report Status PENDING  Incomplete  Culture, blood (Routine x 2)     Status: None (Preliminary result)   Collection Time: 12/09/18 11:00 PM   Specimen: BLOOD RIGHT ARM  Result Value Ref Range Status   Specimen Description   Final    BLOOD RIGHT ARM Performed at [facility], [address] [place], [place] [address]    Special Requests   Final    BOTTLES DRAWN AEROBIC AND ANAEROBIC Blood Culture adequate volume Performed at [facility], [address] [place], [place] [address]    Culture   Final    NO GROWTH 2 DAYS Performed at [facility], [place] [address] [place], [place] [address]    Report Status PENDING  Incomplete    Radiology Studies: No results found. Scheduled Meds: . aspirin EC  81 mg Oral Daily  . DULoxetine  60 mg Oral Daily  . enoxaparin (LOVENOX) injection  40 mg Subcutaneous Q24H  . folic acid  2 mg Oral Daily  . insulin pump   Subcutaneous TID AC, HS, 0200  . losartan  50 mg Oral Daily  . pravastatin  40 mg Oral Daily  . topiramate  200 mg Oral BID  . vitamin B-12  1,000 mcg Oral Daily   Continuous Infusions: . ceFEPime (MAXIPIME) IV 2 g (12/12/18 1421)     LOS: 2  days   Time spent: 27min  Alitza Cowman C Anav Lammert, DO [facility]  If 7PM-7AM, please contact night-coverage [email or web address] Password [facility] 12/12/2018, 3:02 PM

## 2018-12-12 NOTE — Plan of Care (Signed)
  Problem: Clinical Measurements: Goal: Will remain free from infection Outcome: Progressing Goal: Diagnostic test results will improve Outcome: Progressing Goal: Respiratory complications will improve Outcome: Progressing Goal: Cardiovascular complication will be avoided Outcome: Progressing   

## 2018-12-12 NOTE — Progress Notes (Signed)
Patient up to chair today, one assist tolerated well. Will monitor patient. Jessikah Dicker, Bettina Gavia rN

## 2018-12-12 NOTE — Progress Notes (Signed)
Patient requesting miralax. Dr. On call paged through [facility] system to make aware. Will monitor patient. Vikki Gains, Bettina Gavia rN

## 2018-12-13 DIAGNOSIS — N179 Acute kidney failure, unspecified: Secondary | ICD-10-CM

## 2018-12-13 DIAGNOSIS — Z794 Long term (current) use of insulin: Secondary | ICD-10-CM

## 2018-12-13 DIAGNOSIS — E119 Type 2 diabetes mellitus without complications: Secondary | ICD-10-CM

## 2018-12-13 DIAGNOSIS — N390 Urinary tract infection, site not specified: Principal | ICD-10-CM

## 2018-12-13 DIAGNOSIS — Z789 Other specified health status: Secondary | ICD-10-CM

## 2018-12-13 LAB — BASIC METABOLIC PANEL
Anion gap: 11 (ref 5–15)
BUN: 12 mg/dL (ref 8–23)
CO2: 20 mmol/L — ABNORMAL LOW (ref 22–32)
Calcium: 8.6 mg/dL — ABNORMAL LOW (ref 8.9–10.3)
Chloride: 104 mmol/L (ref 98–111)
Creatinine, Ser: 0.9 mg/dL (ref 0.44–1.00)
GFR calc Af Amer: >60 mL/min (ref >60)
GFR calc non Af Amer: >60 mL/min (ref >60)
Glucose, Bld: 158 mg/dL — ABNORMAL HIGH (ref 70–99)
Potassium: 3.9 mmol/L (ref 3.5–5.1)
Sodium: 135 mmol/L (ref 135–145)

## 2018-12-13 LAB — GLUCOSE, CAPILLARY
Glucose-Capillary: 155 mg/dL — ABNORMAL HIGH (ref 70–99)
Glucose-Capillary: 150 mg/dL — ABNORMAL HIGH (ref 70–99)
Glucose-Capillary: 168 mg/dL — ABNORMAL HIGH (ref 70–99)

## 2018-12-13 LAB — CBC
HCT: 34.9 % — ABNORMAL LOW (ref 36.0–46.0)
Hemoglobin: 11.8 g/dL — ABNORMAL LOW (ref 12.0–15.0)
MCH: 30.4 pg (ref 26.0–34.0)
MCHC: 33.8 g/dL (ref 30.0–36.0)
MCV: 89.9 fL (ref 80.0–100.0)
Platelets: 196 10*3/uL (ref 150–400)
RBC: 3.88 MIL/uL (ref 3.87–5.11)
RDW: 15.1 % (ref 11.5–15.5)
WBC: 9 10*3/uL (ref 4.0–10.5)
nRBC: 0 % (ref 0.0–0.2)

## 2018-12-13 MED ORDER — CEFPODOXIME PROXETIL 100 MG PO TABS: 100.0000 mg | ORAL_TABLET | Freq: Two times a day (BID) | ORAL | 0 refills | Status: AC

## 2018-12-13 NOTE — Discharge Summary (Signed)
Physician Discharge Summary  Becky [name] SVX:[id number] DOB: 1955-07-13 DOA: 12/09/2018  PCP: Reynold Bowen, MD  Admit date: 12/09/2018 Discharge date: 12/13/2018  Admitted From: Home Disposition: Home  Recommendations for Outpatient Follow-up:  1. Patient will be discharged on 4 more days of oral Vantin to complete 7 days of antibiotics for UTI. 2. Patient has follow-up appointment with her PCP on 7/1.  Home Health: None Equipment/Devices: None  Discharge Condition: Fair CODE STATUS: Full code Diet recommendation: Carb modified    Discharge Diagnoses:  Principal Problem:   Complicated UTI (urinary tract infection)  Active Problems:   IDDM (insulin dependent diabetes mellitus) (HCC)   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 without organ or systems involvement ([place])   Fever   Failure of outpatient treatment   Acute metabolic encephalopathy Acute kidney injury ([place])  Brief narrative/HPI Please refer to admission H&P for details, in brief, 63 year old female with history of rheumatoid arthritis on Enbrel and methotrexate, diabetes mellitus on insulin pump presented with 2 weeks history of fever with acute change in mental status on the day of admission.  Patient has been treated twice for UTI with Bactrim and Macrobid as outpatient without improvement.  Patient also developed a skin rash with recent antibiotic (nitrofurantoin).  Patient was febrile at home with temperature 103 Fahrenheit and altered mental status.  COVID-19 was checked recently as outpatient and was negative in the PCP office. UA showed few bacteria. Admitted for possible UTI with failed outpatient therapy.   Hospital course Recurrent fever possibly due to complicated UTI with acute metabolic encephalopathy. Failed outpatient therapy.  Placed on empiric IV cefepime and given IV fluids. Remains afebrile for most 48 hours and no further symptoms.  Urine culture and blood cultures  negative. Patient will be discharged on oral Vantin to complete total 7-day course of antibiotic.  Has appointment with her PCP next week. Patient instructed on completing antibiotic course, hydrating herself adequately continue ambulation and around the house.  Other precautions for COVID-19 (adequate hand hygiene, social distancing and persistent use of facemask recommended.  Acute kidney injury, mild Likely prerenal secondary to UTI and poor p.o. intake.  Now resolved with IV fluids.   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  History of rheumatoid arthritis, controlled Continue methotrexate and Enbrel.  Procedure: None Consults: None Family communication: None at bedside Discussion: Home  Discharge Instructions   Allergies as of 12/13/2018      Reactions   Macrobid [nitrofurantoin Macrocrystal] Hives   Ciprofloxacin Rash   Penicillins Rash      Medication List    TAKE these medications   aspirin 81 MG tablet Take 81 mg by mouth daily.   calcium carbonate 600 MG Tabs tablet Commonly known as: OS-CAL Take 600 mg by mouth 2 (two) times daily with a meal.   cefpodoxime 100 MG tablet Commonly known as: VANTIN Take 1 tablet (100 mg total) by mouth 2 (two) times daily for 4 days.   Contour Next Test test strip Generic drug: glucose blood   cyclobenzaprine 10 MG tablet Commonly known as: FLEXERIL Take 10 mg by mouth 3 (three) times daily as needed for muscle spasms. Reported on 07/03/2015   denosumab 60 MG/ML Sosy injection Commonly known as: PROLIA Inject 60 mg into the skin every 6 (six) months. Please deliver to clinic   DULoxetine 60 MG capsule Commonly known as: CYMBALTA Take 60 mg by mouth daily.   etanercept 50 MG/ML injection Commonly known  as: Enbrel SureClick Inject 1.44 mLs (50 mg total) into the skin once a week.   folic acid 1 MG tablet Commonly known as: FOLVITE Take 2 tablets (2 mg total) by mouth daily.    furosemide 20 MG tablet Commonly known as: LASIX Take 10 mg by mouth daily.   insulin aspart 100 UNIT/ML injection Commonly known as: novoLOG Insulin pump   losartan 25 MG tablet Commonly known as: COZAAR Take 25 mg by mouth daily.   methotrexate 50 MG/2ML injection Inject 0.4 mLs (10 mg total) into the skin once a week.   multivitamin tablet Take 1 tablet by mouth daily.   polyethylene glycol 17 g packet Commonly known as: MIRALAX / GLYCOLAX Take 17 g by mouth 2 (two) times daily.   pravastatin 20 MG tablet Commonly known as: PRAVACHOL Take 20 mg by mouth daily.   SUMAtriptan 100 MG tablet Commonly known as: IMITREX Take 100 mg by mouth every 2 (two) hours as needed.   topiramate 200 MG tablet Commonly known as: TOPAMAX Take 200 mg by mouth daily.   Tuberculin-Allergy Syringes 27G X 1/2" 1 ML Kit Inject 1 Syringe into the skin once a week. To be used with weekly methotrexate injections.   valACYclovir 1000 MG tablet Commonly known as: VALTREX Take 1 tablet (1,000 mg total) by mouth daily.   vitamin B-12 1000 MCG tablet Commonly known as: CYANOCOBALAMIN Take 1,000 mcg by mouth daily.      Follow-up Information    Reynold Bowen, MD Follow up on 12/20/2018.   Specialty: Endocrinology Why: has appointment Contact information: [address] [place] [phone]          Allergies  Allergen Reactions  . Macrobid [Nitrofurantoin Macrocrystal] Hives  . Ciprofloxacin Rash  . Penicillins Rash         Procedures/Studies: Dg Chest Port 1 View  Result Date: 12/09/2018 CLINICAL DATA:  Suspected sepsis EXAM: PORTABLE CHEST 1 VIEW COMPARISON:  09/15/2016 FINDINGS: The heart size and mediastinal contours are within normal limits. Both lungs are clear. The visualized skeletal structures are unremarkable. IMPRESSION: No active disease. Electronically Signed   By: Donavan Foil M.D.   On: 12/09/2018 23:43       Subjective: Remains  afebrile.  No urinary symptoms.  Feels better and wants to go home.  Discharge Exam: Vitals:   12/13/18 0620 12/13/18 0759  BP: (!) 142/53 (!) 119/54  Pulse: 94 95  Resp:  17  Temp: 99 F (37.2 C) 98.1 F (36.7 C)  SpO2: 93% 100%   Vitals:   12/12/18 1618 12/12/18 1959 12/13/18 0620 12/13/18 0759  BP: (!) 127/54 (!) 147/59 (!) 142/53 (!) 119/54  Pulse: 92 95 94 95  Resp: _0 Temp: 99.2 F (37.3 C) 99.6 F (37.6 C) 99 F (37.2 C) 98.1 F (36.7 C)  TempSrc: Oral Oral Oral Oral  SpO2: 96% 95% 93% 100%  Weight:      Height:        General: Not in distress HEENT: Moist mucosa, supple neck Chest: Clear bilaterally CVs: Normal S1-S2, no murmurs GI: Soft, nondistended, nontender Musculoskeletal: Warm, no edema     The results of significant diagnostics from this hospitalization (including imaging, microbiology, ancillary and laboratory) are listed below for reference.     Microbiology: Recent Results (from the past 240 hour(s))  Urine culture     Status: None   Collection Time: 12/09/18 10:33 PM   Specimen: Urine, Random  Result Value  Ref Range Status   Specimen Description   Final    URINE, RANDOM Performed at [facility], [address] [place], [place] [address]    Special Requests   Final    NONE Performed at [facility], [address] [place], [place] [address]    Culture   Final    NO GROWTH Performed at [facility], [place] [address], [place], [place] [address]    Report Status 12/11/2018 FINAL  Final  SARS Coronavirus 2 (Hosp order,Performed in [facility] lab via Abbott ID)     Status: None   Collection Time: 12/09/18 10:36 PM   Specimen: Dry Nasal Swab (Abbott ID Now)  Result Value Ref Range Status   SARS Coronavirus 2 (Abbott ID Now) NEGATIVE NEGATIVE Final    Comment: (NOTE) SARS-CoV-2 target nucleic acids are NOT DETECTED. The SARS-CoV-2 RNA is generally detectable in upper and lower respiratory specimens  during the acute phase of infection.  Negativeresults do not preclude SARS-CoV-2 infection, do not rule out coinfections with other pathogens, and should not be used as the  sole basis for treatment or other patient management decisions.  Negative results must be combined with clinical observations, patient history, and epidemiological information. The expected result is Negative. Fact Sheet for Patients: GolfingFamily.no Fact Sheet for Healthcare Providers: https://www.hernandez-brewer.com/ This test is not yet approved or cleared by the [place] FDA and  has been authorized for detection and/or diagnosis of SARS-CoV-2 by FDA under an Emergency Use Authorization (EUA).  This EUA will remain in effect (meaning this test can be used) for the duration of  the COVID19 declaration under Section 5 64(b)(1) of the Act, 21 U.S.C.  section [phone] 3(b)(1), unless the authorization is terminated or revoked sooner. Performed at [facility], [address] [place], [place] [address]   Culture, blood (Routine x 2)     Status: None (Preliminary result)   Collection Time: 12/09/18 10:45 PM   Specimen: BLOOD  Result Value Ref Range Status   Specimen Description   Final    BLOOD LEFT ANTECUBITAL Performed at [facility], [address] [place], [place] [address]    Special Requests   Final    BOTTLES DRAWN AEROBIC AND ANAEROBIC Blood Culture adequate volume Performed at [facility], [address] [place], [place] [address]    Culture   Final    NO GROWTH 2 DAYS Performed at [facility], [place] [address] [place], [place] [address]    Report Status PENDING  Incomplete  Culture, blood (Routine x 2)     Status: None (Preliminary result)   Collection Time: 12/09/18 11:00 PM   Specimen: BLOOD RIGHT ARM  Result Value Ref Range Status   Specimen Description   Final    BLOOD RIGHT ARM Performed at [facility], [address] [place], [place] [address]    Special Requests   Final    BOTTLES DRAWN AEROBIC AND ANAEROBIC Blood Culture adequate volume Performed at [name], [address] [place], [place] [address]    Culture   Final    NO GROWTH 2 DAYS Performed at [facility], [place] [address] [place], [place] [address]    Report Status PENDING  Incomplete     Labs: BNP (last 3 results) No results for input(s): BNP in the last 8760 hours. Basic  Metabolic Panel: Recent Labs  Lab 12/09/18 2259 12/11/18 0423 12/12/18 0447 12/13/18 0503  NA 132* 137 131* 135  K 3.6 3.3* 3.8 3.9  CL 101 107 100 104  CO2 19* 22 17* 20*  GLUCOSE 230* 125* 308* 158*  BUN _0 CREATININE 0.81 0.99 1.08* 0.90  CALCIUM 8.7* 8.6* 8.4* 8.6*   Liver Function Tests: Recent Labs  Lab 12/09/18 2259  AST 38  ALT 31  ALKPHOS 61  BILITOT 0.5  PROT 6.6  ALBUMIN 3.5   No results for input(s): LIPASE, AMYLASE in the last 168 hours. No results for input(s): AMMONIA in the last 168 hours. CBC: Recent Labs  Lab 12/09/18 2259 12/11/18 0423 12/12/18 0447 12/13/18 0503  WBC 4.7 6.0 8.1 9.0  NEUTROABS 3.5  --   --   --   HGB 12.4 11.9* 11.4* 11.8*  HCT 38.0 34.8* 33.9* 34.9*  MCV 92.7 89.5 91.1 89.9  PLT 140* 129* 144* 196   Cardiac Enzymes: No results for input(s): CKTOTAL, CKMB, CKMBINDEX, TROPONINI in the last 168 hours. BNP: Invalid input(s): POCBNP CBG: Recent Labs  Lab 12/12/18 1126 12/12/18 1614 12/12/18 2147 12/13/18 0217 12/13/18 0624  GLUCAP 240* 190* 211* 155* 150*   D-Dimer No results for input(s): DDIMER in the last 72 hours. Hgb A1c No results for input(s): HGBA1C in the last 72 hours. Lipid Profile No results for input(s): CHOL, HDL, LDLCALC, TRIG, CHOLHDL, LDLDIRECT in the last 72 hours. Thyroid function studies No results for input(s): TSH, T4TOTAL, T3FREE, THYROIDAB in the last 72 hours.  Invalid input(s): FREET3 Anemia work up No  results for input(s): VITAMINB12, FOLATE, FERRITIN, TIBC, IRON, RETICCTPCT in the last 72 hours. Urinalysis    Component Value Date/Time   COLORURINE YELLOW 12/09/2018 2233   APPEARANCEUR CLEAR 12/09/2018 2233   LABSPEC 1.015 12/09/2018 2233   PHURINE 7.0 12/09/2018 2233   GLUCOSEU 250 (A) 12/09/2018 2233   HGBUR TRACE (A) 12/09/2018 2233   BILIRUBINUR NEGATIVE 12/09/2018 2233   BILIRUBINUR n 08/02/2013 1542   KETONESUR 40 (A) 12/09/2018 2233   PROTEINUR NEGATIVE 12/09/2018 2233   UROBILINOGEN negative 08/02/2013 1542   NITRITE NEGATIVE 12/09/2018 2233   LEUKOCYTESUR TRACE (A) 12/09/2018 2233   Sepsis Labs Invalid input(s): PROCALCITONIN,  WBC,  LACTICIDVEN Microbiology Recent Results (from the past 240 hour(s))  Urine culture     Status: None   Collection Time: 12/09/18 10:33 PM   Specimen: Urine, Random  Result Value Ref Range Status   Specimen Description   Final    URINE, RANDOM Performed at [facility], [address] [place], [place] [address]    Special Requests   Final    NONE Performed at [facility], [address] [place], [place] [address]    Culture   Final    NO GROWTH Performed at [facility], [place] [address] [place], [place] [address]    Report Status 12/11/2018 FINAL  Final  SARS Coronavirus 2 (Hosp order,Performed in [facility] lab via Abbott ID)     Status: None   Collection Time: 12/09/18 10:36 PM   Specimen: Dry Nasal Swab (Abbott ID Now)  Result Value Ref Range Status   SARS Coronavirus 2 (Abbott ID Now) NEGATIVE NEGATIVE Final    Comment: (NOTE) SARS-CoV-2 target nucleic acids are NOT DETECTED. The SARS-CoV-2 RNA is generally detectable in upper and lower respiratory specimens during the acute phase of infection.  Negativeresults do not  preclude SARS-CoV-2 infection, do not rule out coinfections with other pathogens, and should not be used as the  sole basis for treatment or other patient management decisions.   Negative results must be combined with clinical observations, patient history, and epidemiological information. The expected result is Negative. Fact Sheet for Patients: GolfingFamily.no Fact Sheet for Healthcare Providers: https://www.hernandez-brewer.com/ This test is not yet approved or cleared by the [place] FDA and  has been authorized for detection and/or diagnosis of SARS-CoV-2 by FDA under an Emergency Use Authorization (EUA).  This EUA will remain in effect (meaning this test can be used) for the duration of  the COVID19 declaration under Section 5 64(b)(1) of the Act, 21 U.S.C.  section [phone] 3(b)(1), unless the authorization is terminated or revoked sooner. Performed at [facility], [address] [place], [place] [address]   Culture, blood (Routine x 2)     Status: None (Preliminary result)   Collection Time: 12/09/18 10:45 PM   Specimen: BLOOD  Result Value Ref Range Status   Specimen Description   Final    BLOOD LEFT ANTECUBITAL Performed at [facility], [address] [place], [place] [address]    Special Requests   Final    BOTTLES DRAWN AEROBIC AND ANAEROBIC Blood Culture adequate volume Performed at [facility], [address] [place], [place] [address]    Culture   Final    NO GROWTH 2 DAYS Performed at [facility], [place] [address] [place], [place] [address]    Report Status PENDING  Incomplete  Culture, blood (Routine x 2)     Status: None (Preliminary result)   Collection Time: 12/09/18 11:00 PM   Specimen: BLOOD RIGHT ARM  Result Value Ref Range Status   Specimen Description   Final    BLOOD RIGHT ARM Performed at [facility], [address] [place], [place] [address]    Special Requests   Final    BOTTLES DRAWN AEROBIC AND ANAEROBIC Blood Culture adequate volume Performed at [facility], [address] [place], [place] [address]     Culture   Final    NO GROWTH 2 DAYS Performed at [facility], [place] [address] [place], [place] [address]    Report Status PENDING  Incomplete     Time coordinating discharge: 35 minutes  SIGNED:   Louellen Molder, MD  [facility] 12/13/2018, 8:42 AM Pager   If 7PM-7AM, please contact night-coverage [email or web address] Password [email or web address]

## 2018-12-13 NOTE — Care Management Important Message (Signed)
Important Message  Patient Details  Name: ANWEN CANNEDY MRN: [id number] Date of Birth: [birth year]   Medicare Important Message Given:  Yes     Shelda Altes 12/13/2018, 12:43 PM

## 2018-12-13 NOTE — Discharge Instructions (Signed)
Urinary Tract Infection, Adult A urinary tract infection (UTI) is an infection of any part of the urinary tract. The urinary tract includes:  The kidneys.  The ureters.  The bladder.  The urethra. These organs make, store, and get rid of pee (urine) in the body. What are the causes? This is caused by germs (bacteria) in your genital area. These germs grow and cause swelling (inflammation) of your urinary tract. What increases the risk? You are more likely to develop this condition if:  You have a small, thin tube (catheter) to drain pee.  You cannot control when you pee or poop (incontinence).  You are female, and: ? You use these methods to prevent pregnancy: ? A medicine that kills sperm (spermicide). ? A device that blocks sperm (diaphragm). ? You have low levels of a female hormone (estrogen). ? You are pregnant.  You have genes that add to your risk.  You are sexually active.  You take antibiotic medicines.  You have trouble peeing because of: ? A prostate that is bigger than normal, if you are female. ? A blockage in the part of your body that drains pee from the bladder (urethra). ? A kidney stone. ? A nerve condition that affects your bladder (neurogenic bladder). ? Not getting enough to drink. ? Not peeing often enough.  You have other conditions, such as: ? Diabetes. ? A weak disease-fighting system (immune system). ? Sickle cell disease. ? Gout. ? Injury of the spine. What are the signs or symptoms? Symptoms of this condition include:  Needing to pee right away (urgently).  Peeing often.  Peeing small amounts often.  Pain or burning when peeing.  Blood in the pee.  Pee that smells bad or not like normal.  Trouble peeing.  Pee that is cloudy.  Fluid coming from the vagina, if you are female.  Pain in the belly or lower back. Other symptoms include:  Throwing up (vomiting).  No urge to eat.  Feeling mixed up (confused).  Being tired  and grouchy (irritable).  A fever.  Watery poop (diarrhea). How is this treated? This condition may be treated with:  Antibiotic medicine.  Other medicines.  Drinking enough water. Follow these instructions at home:  Medicines  Take over-the-counter and prescription medicines only as told by your doctor.  If you were prescribed an antibiotic medicine, take it as told by your doctor. Do not stop taking it even if you start to feel better. General instructions  Make sure you: ? Pee until your bladder is empty. ? Do not hold pee for a long time. ? Empty your bladder after sex. ? Wipe from front to back after pooping if you are a female. Use each tissue one time when you wipe.  Drink enough fluid to keep your pee pale yellow.  Keep all follow-up visits as told by your doctor. This is important. Contact a doctor if:  You do not get better after 1-2 days.  Your symptoms go away and then come back. Get help right away if:  You have very bad back pain.  You have very bad pain in your lower belly.  You have a fever.  You are sick to your stomach (nauseous).  You are throwing up. Summary  A urinary tract infection (UTI) is an infection of any part of the urinary tract.  This condition is caused by germs in your genital area.  There are many risk factors for a UTI. These include having a small, thin   tube to drain pee and not being able to control when you pee or poop.  Treatment includes antibiotic medicines for germs.  Drink enough fluid to keep your pee pale yellow. This information is not intended to replace advice given to you by your health care provider. Make sure you discuss any questions you have with your health care provider. Document Released: 11/24/2007 Document Revised: 12/15/2017 Document Reviewed: 12/15/2017 Elsevier Interactive Patient Education  2019 [employer] Inc.  

## 2018-12-13 NOTE — Progress Notes (Addendum)
Patient given discharge instructions, medication list and paper prescription. Follow up appointments given. Patient verbalized understanding. IV dcd. Will discharge home as ordered. Transported to exit via wheel chair and Scientist, physiological, Bettina Gavia rN

## 2018-12-14 NOTE — Telephone Encounter (Signed)
Received a fax from [facility] regarding a prior authorization for ENBREL. Authorization has been APPROVED from 12/14/18 to 12/14/19.   Will send document to scan center.  Authorization # [id number]   9:28 AM Beatriz Chancellor, CPhT

## 2018-12-15 LAB — CULTURE, BLOOD (ROUTINE X 2)
Culture: NO GROWTH
Culture: NO GROWTH
Special Requests: ADEQUATE
Special Requests: ADEQUATE

## 2018-12-15 NOTE — Progress Notes (Deleted)
Office Visit Note  [name]             Date of Birth: 08-May-1956           MRN: [id number]             PCP: Reynold Bowen, MD Referring: Reynold Bowen, MD Visit Date: 12/28/2018 Occupation: @GUAROCC @  Subjective:  No chief complaint on file.   History of Present Illness: Becky [name] is a 63 y.o. female ***   Activities of Daily Living:  Patient reports morning stiffness for *** {minute/hour:19697}.   Patient {ACTIONS;DENIES/REPORTS:21021675::"Denies"} nocturnal pain.  Difficulty dressing/grooming: {ACTIONS;DENIES/REPORTS:21021675::"Denies"} Difficulty climbing stairs: {ACTIONS;DENIES/REPORTS:21021675::"Denies"} Difficulty getting out of chair: {ACTIONS;DENIES/REPORTS:21021675::"Denies"} Difficulty using hands for taps, buttons, cutlery, and/or writing: {ACTIONS;DENIES/REPORTS:21021675::"Denies"}  No Rheumatology ROS completed.   PMFS History:  Patient Active Problem List   Diagnosis Date Noted  . Complicated UTI (urinary tract infection) 12/13/2018  . Failure of outpatient treatment 12/11/2018  . Acute metabolic encephalopathy 95/28/4132  . Fever 12/10/2018  . Breast cancer screening, high risk patient 05/09/2018  . Eosinophilia 01/31/2017  . Iritis 01/04/2017  . History of joint swelling 08/24/2016  . Osteoarthritis of knees, bilateral 08/24/2016  . History of migraine 08/24/2016  . History of depression 08/24/2016  . History of osteoporosis 08/24/2016  . DDD (degenerative disc disease), lumbar 08/24/2016  . High risk medication use 08/24/2016  . Trigger finger, left middle finger 08/24/2016  . Trigger finger, right ring finger 08/24/2016  . 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 Increased risk of breast cancer 07/06/2015  . IDDM (insulin dependent diabetes mellitus) ([place]) 07/06/2015  . 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 without organ or systems involvement ([place]) 07/06/2015  . Sjogren's syndrome ([place]) 07/06/2015   . Genetic testing 05/12/2015  . Family history of breast cancer 04/15/2015  . Fibrocystic breast disease 10/11/2012  . GERD 10/01/2009  . Chest pain with moderate risk for cardiac etiology 09/02/2009  . DYSPHAGIA 09/02/2009  . ANXIETY 08/15/2009  . ULCER-GASTRIC 08/15/2009  . CONSTIPATION 08/15/2009  . SLOW TRANSIT CONSTIPATION 08/15/2009  . FLATULENCE ERUCTATION AND GAS PAIN 08/15/2009    Past Medical History:  Diagnosis Date  . Abnormal ECG   . Anemia   . Anxiety   . Cataract    . Cateracts bil eyes removed  . GERD (gastroesophageal reflux disease)   . Heart murmur   . Hypercholesterolemia   . IDDM (insulin dependent diabetes mellitus) ([place]) 1969  . Migraine headache   . Neuromuscular disorder (HCC)    bil legs & feet - neuropathy  . Neuropathy   . Osteoporosis    10/2012  . Otosclerosis   . RA (rheumatoid arthritis) ([place]) 2000  . S/P cardiac cath August 2013   Normal coronaries and normal LV function.  . Sjoegren syndrome     Family History  Problem Relation Age of Onset  . Breast cancer Mother 16  . Colon polyps Mother        unspecified number  . Uterine cancer Mother   . Breast cancer Maternal Grandmother 49  . Stroke Father 80  . Rheum arthritis Sister   . Breast cancer Sister 48       lump; (-)GT (breast/ova ca panel, GeneDx)  . Skin cancer Maternal Grandfather        unspecified type  . Prostate cancer Maternal Grandfather        dx. late 30s  . Breast cancer Other        dx. in  their late 47s  . Breast cancer Other        dx. 78-79  . Breast cancer Cousin        bilateral; dx. late 71s  . Colon cancer Neg Hx   . Esophageal cancer Neg Hx   . Rectal cancer Neg Hx   . Stomach cancer Neg Hx    Past Surgical History:  Procedure Laterality Date  . BREAST LUMPECTOMY  1995  . CARPAL TUNNEL RELEASE  2000  . CATARACT EXTRACTION Right 09-05-12  . CATARACT EXTRACTION Left 3/15  . CESAREAN SECTION  1987  . COLONOSCOPY    . EYE SURGERY     diabetic  retinopathy   . HYSTEROSCOPY  02/2001   and D & C  . LYMPH NODE DISSECTION  2002  . TOE SURGERY  2000   bone removed   Social History   Social History Narrative  . Not on file   Immunization History  Administered Date(s) Administered  . Tdap 06/21/2013     Objective: Vital Signs: LMP 09/19/2008    Physical Exam   Musculoskeletal Exam: ***  CDAI Exam: CDAI Score: - Patient Global: -; Provider Global: - Swollen: -; Tender: - Joint Exam   No joint exam has been documented for this visit   There is currently no information documented on the homunculus. Go to the Rheumatology activity and complete the homunculus joint exam.  Investigation: No additional findings.  Imaging: Dg Chest Port 1 View  Result Date: 12/09/2018 CLINICAL DATA:  Suspected sepsis EXAM: PORTABLE CHEST 1 VIEW COMPARISON:  09/15/2016 FINDINGS: The heart size and mediastinal contours are within normal limits. Both lungs are clear. The visualized skeletal structures are unremarkable. IMPRESSION: No active disease. Electronically Signed   By: Jasmine Pang M.D.   On: 12/09/2018 23:43    Recent Labs: Lab Results  Component Value Date   WBC 9.0 12/13/2018   HGB 11.8 (L) 12/13/2018   PLT 196 12/13/2018   NA 135 12/13/2018   K 3.9 12/13/2018   CL 104 12/13/2018   CO2 20 (L) 12/13/2018   GLUCOSE 158 (H) 12/13/2018   BUN 12 12/13/2018   CREATININE 0.90 12/13/2018   BILITOT 0.5 12/09/2018   ALKPHOS 61 12/09/2018   AST 38 12/09/2018   ALT 31 12/09/2018   PROT 6.6 12/09/2018   ALBUMIN 3.5 12/09/2018   CALCIUM 8.6 (L) 12/13/2018   GFRAA >60 12/13/2018   QFTBGOLD NEGATIVE 04/06/2017   QFTBGOLDPLUS NEGATIVE 03/27/2018    Speciality Comments: No specialty comments available.  Procedures:  No procedures performed Allergies: Macrobid [nitrofurantoin macrocrystal], Ciprofloxacin, and Penicillins   Assessment / Plan:     Visit Diagnoses: No diagnosis found.   Orders: No orders of the defined types  were placed in this encounter.  No orders of the defined types were placed in this encounter.   Face-to-face time spent with patient was *** minutes. Greater than 50% of time was spent in counseling and coordination of care.  Follow-Up Instructions: No follow-ups on file.   Becky [name] Henri, CMA  Note - This record has been created using Animal nutritionist.  Chart creation errors have been sought, but may not always  have been located. Such creation errors do not reflect on  the standard of medical care.

## 2018-12-20 ENCOUNTER — Ambulatory Visit: Payer: Self-pay | Admitting: Physician Assistant

## 2018-12-20 DIAGNOSIS — N183 Chronic kidney disease, stage 3 (moderate): Secondary | ICD-10-CM | POA: Diagnosis not present

## 2018-12-20 DIAGNOSIS — R509 Fever, unspecified: Secondary | ICD-10-CM | POA: Diagnosis not present

## 2018-12-20 DIAGNOSIS — E103493 Type 1 diabetes mellitus with severe nonproliferative diabetic retinopathy without macular edema, bilateral: Secondary | ICD-10-CM | POA: Diagnosis not present

## 2018-12-20 DIAGNOSIS — M069 Rheumatoid arthritis, unspecified: Secondary | ICD-10-CM | POA: Diagnosis not present

## 2018-12-21 ENCOUNTER — Other Ambulatory Visit: Payer: Self-pay

## 2018-12-26 ENCOUNTER — Ambulatory Visit: Payer: Medicare Other | Admitting: Obstetrics and Gynecology

## 2018-12-28 ENCOUNTER — Ambulatory Visit: Payer: Medicare Other | Admitting: Physician Assistant

## 2018-12-28 DIAGNOSIS — M542 Cervicalgia: Secondary | ICD-10-CM | POA: Diagnosis not present

## 2018-12-28 DIAGNOSIS — G518 Other disorders of facial nerve: Secondary | ICD-10-CM | POA: Diagnosis not present

## 2018-12-28 DIAGNOSIS — G43719 Chronic migraine without aura, intractable, without status migrainosus: Secondary | ICD-10-CM | POA: Diagnosis not present

## 2018-12-28 DIAGNOSIS — M791 Myalgia, unspecified site: Secondary | ICD-10-CM | POA: Diagnosis not present

## 2019-01-06 DIAGNOSIS — E1040 Type 1 diabetes mellitus with diabetic neuropathy, unspecified: Secondary | ICD-10-CM | POA: Diagnosis not present

## 2019-01-08 NOTE — Progress Notes (Signed)
Office Visit Note  [name]             Date of Birth: 06/03/1956           MRN: [id number]             PCP: Becky Bowen, MD Referring: Becky Bowen, MD Visit Date: 01/22/2019 Occupation: @GUAROCC @  Subjective:  Pain in both knee joints     History of Present Illness: Becky [name] is a 63 y.o. female with history of seropositive rheumatoid arthritis, iritis, Sjogren's, and osteoarthritis.  She is on Enbrel 50 mg sq once weekly, MTX 0.4 ml sq once weekly, and folic acid 2 mg po daily.  Patient reports that she was admitted to the hospital on 12/09/2018 due to being neutropenic and having a fever.  She reports that 2 weeks prior she was evaluated by her PCP and was tested negative for COVID-19.  She she had 2 rounds of antibiotics including Macrobid and Bactrim.  She developed a reaction to Macrobid and discontinued. She presented to the emergency department on 12/09/2018 with confusion and a fever. While admitted she was treated for a UTI with maxipime.  She reports that she held methotrexate and Enbrel for 3 weeks during that time.  She restarted about 2 weeks ago and is due for her injection and MTX tomorrow.  She states that while off Enbrel methotrexate she developed increased stiffness in both hands.  She continues to have chronic pain in bilateral knee joints but denies any joint swelling.  She has difficulty kneeling and going up and down steps discomfort she experiences.  She states in the past she had Visco gel injections which were effective but she cannot afford to repeat these injections at this time.  She typically uses Aspercreme topically as needed for pain relief but occasionally will use Voltaren gel.  She continues to have chronic lower back pain. She denies any recent iritis flares.  She has an upcoming appointment with her ophthalmologist next week. She states that her PCP Dr. Tamala [name] recently ordered an updated DEXA and she will be scheduling this.  She  cannot remember the last time she had a Prolia injection.    Activities of Daily Living:  Patient reports morning stiffness for 1 hour.   Patient Denies nocturnal pain.  Difficulty dressing/grooming: Denies Difficulty climbing stairs: Reports Difficulty getting out of chair: Reports Difficulty using hands for taps, buttons, cutlery, and/or writing: Denies  Review of Systems  Constitutional: Positive for fatigue.  HENT: Positive for mouth dryness. Negative for mouth sores and nose dryness.   Eyes: Positive for dryness. Negative for pain and visual disturbance.  Respiratory: Negative for cough, hemoptysis, shortness of breath and difficulty breathing.   Cardiovascular: Negative for chest pain, palpitations, hypertension and swelling in legs/feet.  Gastrointestinal: Negative for blood in stool, constipation and diarrhea.  Endocrine: Negative for increased urination.  Genitourinary: Negative for painful urination.  Musculoskeletal: Positive for arthralgias, joint pain and morning stiffness. Negative for joint swelling, myalgias, muscle weakness, muscle tenderness and myalgias.  Skin: Negative for color change, pallor, rash, hair loss, nodules/bumps, skin tightness, ulcers and sensitivity to sunlight.  Allergic/Immunologic: Negative for susceptible to infections.  Neurological: Negative for dizziness, numbness, headaches and weakness.  Hematological: Negative for swollen glands.  Psychiatric/Behavioral: Negative for depressed mood and sleep disturbance. The patient is not nervous/anxious.     PMFS History:  Patient Active Problem List   Diagnosis Date Noted   Complicated UTI (urinary tract  infection) 12/13/2018   Failure of outpatient treatment 12/11/2018   Acute metabolic encephalopathy 12/11/2018   Fever 12/10/2018   Breast cancer screening, high risk patient 05/09/2018   Eosinophilia 01/31/2017   Iritis 01/04/2017   History of joint swelling 08/24/2016   Osteoarthritis  of knees, bilateral 08/24/2016   History of migraine 08/24/2016   History of depression 08/24/2016   History of osteoporosis 08/24/2016   DDD (degenerative disc disease), lumbar 08/24/2016   High risk medication use 08/24/2016   Trigger finger, left middle finger 08/24/2016   Trigger finger, right ring finger 08/24/2016   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   Increased risk of breast cancer 07/06/2015   IDDM (insulin dependent diabetes mellitus) (HCC) 07/06/2015   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 without organ or systems involvement (HCC) 07/06/2015   Sjogren's syndrome (HCC) 07/06/2015   Genetic testing 05/12/2015   Family history of breast cancer 04/15/2015   Fibrocystic breast disease 10/11/2012   GERD 10/01/2009   Chest pain with moderate risk for cardiac etiology 09/02/2009   DYSPHAGIA 09/02/2009   ANXIETY 08/15/2009   ULCER-GASTRIC 08/15/2009   CONSTIPATION 08/15/2009   SLOW TRANSIT CONSTIPATION 08/15/2009   FLATULENCE ERUCTATION AND GAS PAIN 08/15/2009    Past Medical History:  Diagnosis Date   Abnormal ECG    Anemia    Anxiety    Cataract    . Cateracts bil eyes removed   GERD (gastroesophageal reflux disease)    Heart murmur    Hypercholesterolemia    IDDM (insulin dependent diabetes mellitus) (HCC) 1969   Migraine headache    Neuromuscular disorder (HCC)    bil legs & feet - neuropathy   Neuropathy    Osteoporosis    10/2012   Otosclerosis    RA (rheumatoid arthritis) (HCC) 2000   S/P cardiac cath August 2013   Normal coronaries and normal LV function.   Sjoegren syndrome     Family History  Problem Relation Age of Onset   Breast cancer Mother 165   Colon polyps Mother        unspecified number   Uterine cancer Mother    Breast cancer Maternal Grandmother 1751   Stroke Father 2475   Rheum arthritis Sister    Breast cancer Sister 48       lump; (-)GT (breast/ova ca panel, GeneDx)    Skin cancer Maternal Grandfather        unspecified type   Prostate cancer Maternal Grandfather        dx. late 3370s   Breast cancer Other        dx. in their late 60s   Breast cancer Other        dx. 78-79   Breast cancer Cousin        bilateral; dx. late 3750s   Colon cancer Neg Hx    Esophageal cancer Neg Hx    Rectal cancer Neg Hx    Stomach cancer Neg Hx    Past Surgical History:  Procedure Laterality Date   BREAST LUMPECTOMY  1995   CARPAL TUNNEL RELEASE  2000   CATARACT EXTRACTION Right 09-05-12   CATARACT EXTRACTION Left 3/15   CESAREAN SECTION  1987   COLONOSCOPY     EYE SURGERY     diabetic retinopathy    HYSTEROSCOPY  02/2001   and D & C   LYMPH NODE DISSECTION  2002   TOE SURGERY  2000   bone removed   Social History   Social History  Narrative   Not on file   Immunization History  Administered Date(s) Administered   Tdap 06/21/2013     Objective: Vital Signs: BP 132/80 (BP Location: Left Arm, Patient Position: Sitting, Cuff Size: Normal)    Pulse 86    Resp 12    Ht 5\' 4"  (1.626 m)    Wt 128 lb 12.8 oz (58.4 kg)    LMP 09/19/2008    BMI 22.11 kg/m    Physical Exam Vitals signs and nursing note reviewed.  Constitutional:      Appearance: She is well-developed.  HENT:     Head: Normocephalic and atraumatic.  Eyes:     Conjunctiva/sclera: Conjunctivae normal.  Neck:     Musculoskeletal: Normal range of motion.  Cardiovascular:     Rate and Rhythm: Normal rate and regular rhythm.     Heart sounds: Normal heart sounds.  Pulmonary:     Effort: Pulmonary effort is normal.     Breath sounds: Normal breath sounds.  Abdominal:     General: Bowel sounds are normal.     Palpations: Abdomen is soft.  Lymphadenopathy:     Cervical: No cervical adenopathy.  Skin:    General: Skin is warm and dry.     Capillary Refill: Capillary refill takes less than 2 seconds.  Neurological:     Mental Status: She is alert and oriented to person,  place, and time.  Psychiatric:        Behavior: Behavior normal.      Musculoskeletal Exam: C-spine, thoracic spine, lumbar spine good range of motion.  No midline spinal tenderness.  She has right SI joint tenderness.  Shoulder joints, elbow joints, wrist joints, MCPs, PIPs, DIPs good range of motion with no synovitis.  She is complete fist formation bilaterally.  Hip joints, knee joints, ankle joints, MTPs, PIPs, DIPs good range of motion no synovitis.  She has bilateral knee crepitus.  No warmth or effusion of bilateral knee joints.  No tenderness or swelling of ankle joints.  She has tenderness over bilateral trochanteric bursa.  CDAI Exam: CDAI Score: 1  Patient Global: 5 mm; Provider Global: 5 mm Swollen: 0 ; Tender: 0  Joint Exam   No joint exam has been documented for this visit   There is currently no information documented on the homunculus. Go to the Rheumatology activity and complete the homunculus joint exam.  Investigation: No additional findings.  Imaging: No results found.  Recent Labs: Lab Results  Component Value Date   WBC 9.0 12/13/2018   HGB 11.8 (L) 12/13/2018   PLT 196 12/13/2018   NA 135 12/13/2018   K 3.9 12/13/2018   CL 104 12/13/2018   CO2 20 (L) 12/13/2018   GLUCOSE 158 (H) 12/13/2018   BUN 12 12/13/2018   CREATININE 0.90 12/13/2018   BILITOT 0.5 12/09/2018   ALKPHOS 61 12/09/2018   AST 38 12/09/2018   ALT 31 12/09/2018   PROT 6.6 12/09/2018   ALBUMIN 3.5 12/09/2018   CALCIUM 8.6 (L) 12/13/2018   GFRAA >60 12/13/2018   QFTBGOLD NEGATIVE 04/06/2017   QFTBGOLDPLUS NEGATIVE 03/27/2018    Speciality Comments: Recieves Enbrel Sureclick through 05/27/2018.  Procedures:  No procedures performed Allergies: Macrobid [nitrofurantoin macrocrystal], Ciprofloxacin, and Penicillins     Assessment / Plan:     Visit Diagnoses: 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 without organ or systems involvement (HCC)  -She has no synovitis or tenderness on exam.  She has not had  any recent rheumatoid arthritis flares.  She had to hold Enbrel and MTX for 3 weeks due to being diagnosed with a UTI (treated outpatient with Bactrim and Macrobid) and then being admitted for treatment on 12/09/18.  She was treated with Cefepime during her hospital stay.  She resumed her medications 2 weeks ago.  While holding Enbrel and MTX she experienced increased stiffness in both hands but no joint swelling or tenderness.  She continues to have chronic pain in both knee joints.  She cannot have cortisone injections due to her history of DM.  She has had visco gel injections in the past, and she will notify us if she would like to reapply for visco injections in the future.  She will continue on Enbrel and MTX as prescribed.  A refill of Enbrel was sent today.  She was advised to notify us if she develops increased joint pain or joint swelling.  She will follow up in 5 months.  High risk medication use -Enbrel SureClick 50 mg every 7 days, methotrexate 0.4 mL every 7 days, and folic acid 1 mg 2 tablets daily.  Last TB gold negative on 03/27/2018 and will monitor yearly.  Most recent CBC within normal limits except for low hemoglobin/hematocrit on 12/13/2018.  Most recent BMP within normal limits except for low calcium on 12/13/2018.  Last CMP showed normal liver enzymes on 12/09/2018.  Due for CBC/CMP in September and will monitor every 3 months.  Standing orders are in place.  I discussed the importance of holding Enbrel and methotrexate if she develops any signs or symptoms of an infection and to resume once infection is completely cleared.  She held Enbrel methotrexate after being hospitalized for fever and neutropenia in June 2020.  She continues to see her dermatologist every 6 months for skin checks. - Plan: QuantiFERON-TB Gold Plus, etanercept (ENBREL SURECLICK) 50 MG/ML injection  Iritis - She has not had any recent flares or recurrences.  She  has no eye pain, photophobia, or conjunctival injection at this time.  She has an appointment with her ophthalmologist next week.  She was advised to notify us if she has signs or symptoms of a flare.   Sjogren's syndrome with keratoconjunctivitis sicca (HCC) - She continues to have chronic sicca symptoms. She uses OTC products as needed for symptomatic relif.    Primary osteoarthritis of both knees - Chronic pain.  She has good ROM bilaterally.  No warmth or effusion noted.  She has bilateral knee crepitus.  She is not a candidate for cortisone injections due to her history of diabetes.  She has had visco injections in the past, which were effective but she cannot afford repeat injections at this time.  She will notify us when she would like to reapply for visco injections. She was encouraged to work on lower extremity muscle strengthening.  She was given a handout of knee exercises to perform.  She can continue using voltaren gel topically as needed for pain relief.  DDD (degenerative disc disease), lumbar - Chronic pain.  She has no midline spinal tenderness at this time.  She had a MRI in 2016 that revealed severe DDD and foraminal stenosis at multiple levels.  She has right SI joint tenderness on exam today.  She was given a handout of back exercises to perform.   She declined PT at this time.   History of osteoporosis - She is on Prolia 60 mg every 6 months.  She also takes a calcium supplement  daily. Last note states Dr. [facility] but we sent a prescription in July 2019.  Last DEXA 10/21/2015 showed T-score of 2.3 at right femur neck.  Due for repeat DEXA.  I advised her to schedule an update DEXA prior to scheduling a prolia injection.   Other fatigue - She has chronic fatigue.  Her level of fatigue has started to improve since being discharged from the hospital.   Other medical conditions are listed as follows:    History of gastroesophageal reflux (GERD)  History of diabetes mellitus    History of depression   History of migraine   Orders: Orders Placed This Encounter  Procedures   QuantiFERON-TB Gold Plus   Meds ordered this encounter  Medications   etanercept (ENBREL SURECLICK) 50 MG/ML injection    Sig: Inject 0.98 mLs (50 mg total) into the skin once a week.    Dispense:  11.76 mL    Refill:  0    Faxed to [facility]    Face-to-face time spent with patient was 30 minutes. Greater than 50% of time was spent in counseling and coordination of care.  Follow-Up Instructions: Return in about 5 months (around 06/24/2019) for Rheumatoid arthritis, Osteoarthritis.   Gearldine Bienenstockaylor M Rhylen Shaheen, PA-C  Note - This record has been created using Dragon software.  Chart creation errors have been sought, but may not always  have been located. Such creation errors do not reflect on  the standard of medical care.

## 2019-01-09 ENCOUNTER — Other Ambulatory Visit: Payer: Self-pay | Admitting: *Deleted

## 2019-01-09 MED ORDER — "TUBERCULIN-ALLERGY SYRINGES 27G X 1/2"" 1 ML KIT": PACK | 3 refills | Status: DC

## 2019-01-09 MED ORDER — METHOTREXATE SODIUM CHEMO INJECTION 50 MG/2ML: 10.0000 mg | INTRAMUSCULAR | 0 refills | Status: DC

## 2019-01-09 NOTE — Telephone Encounter (Signed)
Last Visit: 04/13/18 Next visit: 01/22/19 Labs: 12/09/18 Glucose 230 CO2 19, Sodium 132, Calcium 8.7, Platelets 140  Okay to refill per Dr. Estanislado Pandy

## 2019-01-22 ENCOUNTER — Other Ambulatory Visit: Payer: Self-pay

## 2019-01-22 ENCOUNTER — Ambulatory Visit (INDEPENDENT_AMBULATORY_CARE_PROVIDER_SITE_OTHER): Payer: Medicare Other | Admitting: Physician Assistant

## 2019-01-22 ENCOUNTER — Encounter: Payer: Self-pay | Admitting: Physician Assistant

## 2019-01-22 VITALS — BP 132/80 | HR 86 | Resp 12 | Ht 64.0 in | Wt 128.8 lb

## 2019-01-22 DIAGNOSIS — Z8739 Personal history of other diseases of the musculoskeletal system and connective tissue: Secondary | ICD-10-CM

## 2019-01-22 DIAGNOSIS — R5383 Other fatigue: Secondary | ICD-10-CM

## 2019-01-22 DIAGNOSIS — H209 Unspecified iridocyclitis: Secondary | ICD-10-CM

## 2019-01-22 DIAGNOSIS — Z79899 Other long term (current) drug therapy: Secondary | ICD-10-CM

## 2019-01-22 DIAGNOSIS — Z8659 Personal history of other mental and behavioral disorders: Secondary | ICD-10-CM

## 2019-01-22 DIAGNOSIS — M3501 Sicca syndrome with keratoconjunctivitis: Secondary | ICD-10-CM

## 2019-01-22 DIAGNOSIS — M0579 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 without organ or systems involvement: Secondary | ICD-10-CM | POA: Diagnosis not present

## 2019-01-22 DIAGNOSIS — Z8639 Personal history of other endocrine, nutritional and metabolic disease: Secondary | ICD-10-CM

## 2019-01-22 DIAGNOSIS — M170 Bilateral primary osteoarthritis of knee: Secondary | ICD-10-CM

## 2019-01-22 DIAGNOSIS — M5136 Other intervertebral disc degeneration, lumbar region: Secondary | ICD-10-CM

## 2019-01-22 DIAGNOSIS — Z8719 Personal history of other diseases of the digestive system: Secondary | ICD-10-CM

## 2019-01-22 DIAGNOSIS — Z8669 Personal history of other diseases of the nervous system and sense organs: Secondary | ICD-10-CM

## 2019-01-22 MED ORDER — ENBREL SURECLICK 50 MG/ML ~~LOC~~ SOAJ: 50.0000 mg | SUBCUTANEOUS | 0 refills | Status: DC

## 2019-01-22 NOTE — Patient Instructions (Addendum)
Standing Labs We placed an order today for your standing lab work.    Please come back and get your standing labs in September and every 3 months   We have open lab daily Monday through Thursday from 8:30-12:30 PM and 1:30-4:30 PM and Friday from 8:30-12:30 PM and 1:30 -4:00 PM at the office of Dr. Pollyann SavoyShaili Deveshwar.   You may experience shorter wait times on Monday and Friday afternoons. The office is located at [address], [address], [place], [place] [id number] No appointment is necessary.   Labs are drawn by [facility].  You may receive a bill from [facility] for your lab work.  If you wish to have your labs drawn at another location, please call the office 24 hours in advance to send orders.  If you have any questions regarding directions or hours of operation,  please call [phone].   Just as a reminder please drink plenty of water prior to coming for your lab work. Thanks!   Journal for Nurse Practitioners, 15(4), [id number]. Retrieved March 27, 2018 from http://clinicalkey.com/nursing">  Knee Exercises Ask your health care provider which exercises are safe for you. Do exercises exactly as told by your health care provider and adjust them as directed. It is normal to feel mild stretching, pulling, tightness, or discomfort as you do these exercises. Stop right away if you feel sudden pain or your pain gets worse. Do not begin these exercises until told by your health care provider. Stretching and range-of-motion exercises These exercises warm up your muscles and joints and improve the movement and flexibility of your knee. These exercises also help to relieve pain and swelling. Knee extension, prone 1. Lie on your abdomen (prone position) on a bed. 2. Place your left / right knee just beyond the edge of the surface so your knee is not on the bed. You can put a towel under your left / right thigh just above your kneecap for comfort. 3. Relax your leg muscles and allow gravity to  straighten your knee (extension). You should feel a stretch behind your left / right knee. 4. Hold this position for __________ seconds. 5. Scoot up so your knee is supported between repetitions. Repeat __________ times. Complete this exercise __________ times a day. Knee flexion, active  1. Lie on your back with both legs straight. If this causes back discomfort, bend your left / right knee so your foot is flat on the floor. 2. Slowly slide your left / right heel back toward your buttocks. Stop when you feel a gentle stretch in the front of your knee or thigh (flexion). 3. Hold this position for __________ seconds. 4. Slowly slide your left / right heel back to the starting position. Repeat __________ times. Complete this exercise __________ times a day. Quadriceps stretch, prone  1. Lie on your abdomen on a firm surface, such as a bed or padded floor. 2. Bend your left / right knee and hold your ankle. If you cannot reach your ankle or pant leg, loop a belt around your foot and grab the belt instead. 3. Gently pull your heel toward your buttocks. Your knee should not slide out to the side. You should feel a stretch in the front of your thigh and knee (quadriceps). 4. Hold this position for __________ seconds. Repeat __________ times. Complete this exercise __________ times a day. Hamstring, supine 1. Lie on your back (supine position). 2. Loop a belt or towel over the ball of your left / right foot. The  ball of your foot is on the walking surface, right under your toes. 3. Straighten your left / right knee and slowly pull on the belt to raise your leg until you feel a gentle stretch behind your knee (hamstring). ? Do not let your knee bend while you do this. ? Keep your other leg flat on the floor. 4. Hold this position for __________ seconds. Repeat __________ times. Complete this exercise __________ times a day. Strengthening exercises These exercises build strength and endurance in  your knee. Endurance is the ability to use your muscles for a long time, even after they get tired. Quadriceps, isometric This exercise stretches the muscles in front of your thigh (quadriceps) without moving your knee joint (isometric). 1. Lie on your back with your left / right leg extended and your other knee bent. Put a rolled towel or small pillow under your knee if told by your health care provider. 2. Slowly tense the muscles in the front of your left / right thigh. You should see your kneecap slide up toward your hip or see increased dimpling just above the knee. This motion will push the back of the knee toward the floor. 3. For __________ seconds, hold the muscle as tight as you can without increasing your pain. 4. Relax the muscles slowly and completely. Repeat __________ times. Complete this exercise __________ times a day. Straight leg raises This exercise stretches the muscles in front of your thigh (quadriceps) and the muscles that move your hips (hip flexors). 1. Lie on your back with your left / right leg extended and your other knee bent. 2. Tense the muscles in the front of your left / right thigh. You should see your kneecap slide up or see increased dimpling just above the knee. Your thigh may even shake a bit. 3. Keep these muscles tight as you raise your leg 4-6 inches (10-15 cm) off the floor. Do not let your knee bend. 4. Hold this position for __________ seconds. 5. Keep these muscles tense as you lower your leg. 6. Relax your muscles slowly and completely after each repetition. Repeat __________ times. Complete this exercise __________ times a day. Hamstring, isometric 1. Lie on your back on a firm surface. 2. Bend your left / right knee about __________ degrees. 3. Dig your left / right heel into the surface as if you are trying to pull it toward your buttocks. Tighten the muscles in the back of your thighs (hamstring) to "dig" as hard as you can without increasing any  pain. 4. Hold this position for __________ seconds. 5. Release the tension gradually and allow your muscles to relax completely for __________ seconds after each repetition. Repeat __________ times. Complete this exercise __________ times a day. Hamstring curls If told by your health care provider, do this exercise while wearing ankle weights. Begin with __________ lb weights. Then increase the weight by 1 lb (0.5 kg) increments. Do not wear ankle weights that are more than __________ lb. 1. Lie on your abdomen with your legs straight. 2. Bend your left / right knee as far as you can without feeling pain. Keep your hips flat against the floor. 3. Hold this position for __________ seconds. 4. Slowly lower your leg to the starting position. Repeat __________ times. Complete this exercise __________ times a day. Squats This exercise strengthens the muscles in front of your thigh and knee (quadriceps). 1. Stand in front of a table, with your feet and knees pointing straight ahead. You may  rest your hands on the table for balance but not for support. 2. Slowly bend your knees and lower your hips like you are going to sit in a chair. ? Keep your weight over your heels, not over your toes. ? Keep your lower legs upright so they are parallel with the table legs. ? Do not let your hips go lower than your knees. ? Do not bend lower than told by your health care provider. ? If your knee pain increases, do not bend as low. 3. Hold the squat position for __________ seconds. 4. Slowly push with your legs to return to standing. Do not use your hands to pull yourself to standing. Repeat __________ times. Complete this exercise __________ times a day. Wall slides This exercise strengthens the muscles in front of your thigh and knee (quadriceps). 1. Lean your back against a smooth wall or door, and walk your feet out 18-24 inches (46-61 cm) from it. 2. Place your feet hip-width apart. 3. Slowly slide down  the wall or door until your knees bend __________ degrees. Keep your knees over your heels, not over your toes. Keep your knees in line with your hips. 4. Hold this position for __________ seconds. Repeat __________ times. Complete this exercise __________ times a day. Straight leg raises This exercise strengthens the muscles that rotate the leg at the hip and move it away from your body (hip abductors). 1. Lie on your side with your left / right leg in the top position. Lie so your head, shoulder, knee, and hip line up. You may bend your bottom knee to help you keep your balance. 2. Roll your hips slightly forward so your hips are stacked directly over each other and your left / right knee is facing forward. 3. Leading with your heel, lift your top leg 4-6 inches (10-15 cm). You should feel the muscles in your outer hip lifting. ? Do not let your foot drift forward. ? Do not let your knee roll toward the ceiling. 4. Hold this position for __________ seconds. 5. Slowly return your leg to the starting position. 6. Let your muscles relax completely after each repetition. Repeat __________ times. Complete this exercise __________ times a day. Straight leg raises This exercise stretches the muscles that move your hips away from the front of the pelvis (hip extensors). 1. Lie on your abdomen on a firm surface. You can put a pillow under your hips if that is more comfortable. 2. Tense the muscles in your buttocks and lift your left / right leg about 4-6 inches (10-15 cm). Keep your knee straight as you lift your leg. 3. Hold this position for __________ seconds. 4. Slowly lower your leg to the starting position. 5. Let your leg relax completely after each repetition. Repeat __________ times. Complete this exercise __________ times a day. This information is not intended to replace advice given to you by your health care provider. Make sure you discuss any questions you have with your health care  provider. Document Released: 04/21/2005 Document Revised: 03/28/2018 Document Reviewed: 03/28/2018 Elsevier Patient Education  2020 [employer] Inc.    Back Exercises These exercises help to make your trunk and back strong. They also help to keep the lower back flexible. Doing these exercises can help to prevent back pain or lessen existing pain.  If you have back pain, try to do these exercises 2-3 times each day or as told by your doctor.  As you get better, do the exercises once each  day. Repeat the exercises more often as told by your doctor.  To stop back pain from coming back, do the exercises once each day, or as told by your doctor. Exercises Single knee to chest Do these steps 3-5 times in a row for each leg: 5. Lie on your back on a firm bed or the floor with your legs stretched out. 6. Bring one knee to your chest. 7. Grab your knee or thigh with both hands and hold them it in place. 8. Pull on your knee until you feel a gentle stretch in your lower back or buttocks. 9. Keep doing the stretch for 10-30 seconds. 10. Slowly let go of your leg and straighten it. Pelvic tilt Do these steps 5-10 times in a row: 5. Lie on your back on a firm bed or the floor with your legs stretched out. 6. Bend your knees so they point up to the ceiling. Your feet should be flat on the floor. 7. Tighten your lower belly (abdomen) muscles to press your lower back against the floor. This will make your tailbone point up to the ceiling instead of pointing down to your feet or the floor. 8. Stay in this position for 5-10 seconds while you gently tighten your muscles and breathe evenly. Cat-cow Do these steps until your lower back bends more easily: 1. Get on your hands and knees on a firm surface. Keep your hands under your shoulders, and keep your knees under your hips. You may put padding under your knees. 2. Let your head hang down toward your chest. Tighten (contract) the muscles in your belly.  Point your tailbone toward the floor so your lower back becomes rounded like the back of a cat. 3. Stay in this position for 5 seconds. 4. Slowly lift your head. Let the muscles of your belly relax. Point your tailbone up toward the ceiling so your back forms a sagging arch like the back of a cow. 5. Stay in this position for 5 seconds.  Press-ups Do these steps 5-10 times in a row: 5. Lie on your belly (face-down) on the floor. 6. Place your hands near your head, about shoulder-width apart. 7. While you keep your back relaxed and keep your hips on the floor, slowly straighten your arms to raise the top half of your body and lift your shoulders. Do not use your back muscles. You may change where you place your hands in order to make yourself more comfortable. 8. Stay in this position for 5 seconds. 9. Slowly return to lying flat on the floor.  Bridges Do these steps 10 times in a row: 7. Lie on your back on a firm surface. 8. Bend your knees so they point up to the ceiling. Your feet should be flat on the floor. Your arms should be flat at your sides, next to your body. 9. Tighten your butt muscles and lift your butt off the floor until your waist is almost as high as your knees. If you do not feel the muscles working in your butt and the back of your thighs, slide your feet 1-2 inches farther away from your butt. 10. Stay in this position for 3-5 seconds. 11. Slowly lower your butt to the floor, and let your butt muscles relax. If this exercise is too easy, try doing it with your arms crossed over your chest. Belly crunches Do these steps 5-10 times in a row: 6. Lie on your back on a firm bed or the floor  with your legs stretched out. 7. Bend your knees so they point up to the ceiling. Your feet should be flat on the floor. 8. Cross your arms over your chest. 9. Tip your chin a little bit toward your chest but do not bend your neck. 10. Tighten your belly muscles and slowly raise your  chest just enough to lift your shoulder blades a tiny bit off of the floor. Avoid raising your body higher than that, because it can put too much stress on your low back. 11. Slowly lower your chest and your head to the floor. Back lifts Do these steps 5-10 times in a row: 5. Lie on your belly (face-down) with your arms at your sides, and rest your forehead on the floor. 6. Tighten the muscles in your legs and your butt. 7. Slowly lift your chest off of the floor while you keep your hips on the floor. Keep the back of your head in line with the curve in your back. Look at the floor while you do this. 8. Stay in this position for 3-5 seconds. 9. Slowly lower your chest and your face to the floor. Contact a doctor if:  Your back pain gets a lot worse when you do an exercise.  Your back pain does not get better 2 hours after you exercise. If you have any of these problems, stop doing the exercises. Do not do them again unless your doctor says it is okay. Get help right away if:  You have sudden, very bad back pain. If this happens, stop doing the exercises. Do not do them again unless your doctor says it is okay. This information is not intended to replace advice given to you by your health care provider. Make sure you discuss any questions you have with your health care provider. Document Released: 07/10/2010 Document Revised: 03/02/2018 Document Reviewed: 03/02/2018 Elsevier Patient Education  2020 [employer].

## 2019-01-26 ENCOUNTER — Encounter: Payer: Self-pay | Admitting: Obstetrics and Gynecology

## 2019-01-26 DIAGNOSIS — Z803 Family history of malignant neoplasm of breast: Secondary | ICD-10-CM | POA: Diagnosis not present

## 2019-01-26 DIAGNOSIS — Z1231 Encounter for screening mammogram for malignant neoplasm of breast: Secondary | ICD-10-CM | POA: Diagnosis not present

## 2019-02-06 DIAGNOSIS — E103551 Type 1 diabetes mellitus with stable proliferative diabetic retinopathy, right eye: Secondary | ICD-10-CM | POA: Diagnosis not present

## 2019-02-06 DIAGNOSIS — H43812 Vitreous degeneration, left eye: Secondary | ICD-10-CM | POA: Diagnosis not present

## 2019-02-06 DIAGNOSIS — H43811 Vitreous degeneration, right eye: Secondary | ICD-10-CM | POA: Diagnosis not present

## 2019-02-06 DIAGNOSIS — H04129 Dry eye syndrome of unspecified lacrimal gland: Secondary | ICD-10-CM | POA: Diagnosis not present

## 2019-02-07 DIAGNOSIS — Z0120 Encounter for dental examination and cleaning without abnormal findings: Secondary | ICD-10-CM | POA: Diagnosis not present

## 2019-02-08 DIAGNOSIS — M542 Cervicalgia: Secondary | ICD-10-CM | POA: Diagnosis not present

## 2019-02-08 DIAGNOSIS — G518 Other disorders of facial nerve: Secondary | ICD-10-CM | POA: Diagnosis not present

## 2019-02-08 DIAGNOSIS — M791 Myalgia, unspecified site: Secondary | ICD-10-CM | POA: Diagnosis not present

## 2019-02-08 DIAGNOSIS — G43719 Chronic migraine without aura, intractable, without status migrainosus: Secondary | ICD-10-CM | POA: Diagnosis not present

## 2019-02-22 ENCOUNTER — Other Ambulatory Visit: Payer: Self-pay

## 2019-02-22 NOTE — Progress Notes (Signed)
63 y.o. G42P1001 Married Caucasian female here for annual exam.   Patient complaining of yellow- green vaginal discharge with possible odor.  She denies vaginal bleeding.   She has slow bowel function.  She takes Miralax twice a day to have just small BMs daily.  She does have lower abdominal discomfort.   Hospitalized the end of June for presumed UTI.  She had fever and mental status change. She was dx with an allergy to [place].  She tested negative for Covid 19.  Her energy is still a bit low. Blood sugars are controlled again.   Patient is struggling with her mother's metastatic cancer.  PCP: Reynold Bowen, MD   Patient's last menstrual period was 09/19/2008.           Sexually active: Yes.    The current method of family planning is post menopausal status.    Exercising: No.  was before June--will restart. Smoker:  no  Health Maintenance: Pap: 09-21-17 Neg:Neg HR HPV, 03-18-15 Neg:Neg HR HPV History of abnormal Pap:  Yes, years ago in 20s -- normal since MMG: 01-26-19 Neg/density D/BiRads2.  Breast MR - normal 05/30/19. Colonoscopy: 07/03/15, Normal, repeat in 10 years  BMD:  09/2015  Result  :Osteoporosis--Dr.South to call TDaP:  2015 Gardasil:   n/a HIV: 12-10-18 NR Hep C: Screening Labs:  ---   reports that she has never smoked. She has never used smokeless tobacco. She reports current alcohol use of about 4.0 standard drinks of alcohol per week. She reports that she does not use drugs.  Past Medical History:  Diagnosis Date  . Abnormal ECG   . Anemia   . Anxiety   . Cataract    . Cateracts bil eyes removed  . GERD (gastroesophageal reflux disease)   . Heart murmur   . Hypercholesterolemia   . IDDM (insulin dependent diabetes mellitus) ([place]) 1969  . Migraine headache   . Neuromuscular disorder ([facility])    bil legs & feet - neuropathy  . Neuropathy   . Osteoporosis    10/2012  . Otosclerosis   . RA (rheumatoid arthritis) ([place]) 2000  . S/P cardiac cath August  2013   Normal coronaries and normal LV function.  . Sjoegren syndrome     Past Surgical History:  Procedure Laterality Date  . BREAST LUMPECTOMY  1995  . CARPAL TUNNEL RELEASE  2000  . CATARACT EXTRACTION Right 09-05-12  . CATARACT EXTRACTION Left 3/15  . CESAREAN SECTION  1987  . COLONOSCOPY    . EYE SURGERY     diabetic retinopathy   . HYSTEROSCOPY  02/2001   and D & C  . LYMPH NODE DISSECTION  2002  . TOE SURGERY  2000   bone removed    Current Outpatient Medications  Medication Sig Dispense Refill  . aspirin 81 MG tablet Take 81 mg by mouth daily.    . calcium carbonate (OS-CAL) 600 MG TABS Take 600 mg by mouth 2 (two) times daily with a meal.     . CONTOUR NEXT TEST test strip   6  . cyclobenzaprine (FLEXERIL) 10 MG tablet Take 10 mg by mouth 3 (three) times daily as needed for muscle spasms. Reported on 07/03/2015    . denosumab (PROLIA) 60 MG/ML SOSY injection Inject 60 mg into the skin every 6 (six) months. Please deliver to clinic 1 Syringe 0  . DULoxetine (CYMBALTA) 60 MG capsule Take 60 mg by mouth daily.    Marland Kitchen etanercept (ENBREL SURECLICK) 50 MG/ML  injection Inject 0.98 mLs (50 mg total) into the skin once a week. 25.49 mL 0  . folic acid (FOLVITE) 1 MG tablet Take 2 tablets (2 mg total) by mouth daily. 180 tablet 3  . furosemide (LASIX) 20 MG tablet Take 10 mg by mouth daily.     . insulin aspart (NOVOLOG) 100 UNIT/ML injection Insulin pump    . losartan (COZAAR) 25 MG tablet Take 25 mg by mouth daily.     . methotrexate 50 MG/2ML injection Inject 0.4 mLs (10 mg total) into the skin once a week. 5 mL 0  . Multiple Vitamin (MULTIVITAMIN) tablet Take 1 tablet by mouth daily.    . polyethylene glycol (MIRALAX / GLYCOLAX) 17 g packet Take 17 g by mouth 2 (two) times daily.     . pravastatin (PRAVACHOL) 20 MG tablet Take 20 mg by mouth daily.     . SUMAtriptan (IMITREX) 100 MG tablet Take 100 mg by mouth every 2 (two) hours as needed.    . topiramate (TOPAMAX) 200 MG  tablet Take 200 mg by mouth daily.     . Tuberculin-Allergy Syringes 27G X 1/2" 1 ML KIT To be used with weekly methotrexate injections once weekly. 12 kit 3  . vitamin B-12 (CYANOCOBALAMIN) 1000 MCG tablet Take 1,000 mcg by mouth daily.     No current facility-administered medications for this visit.     Family History  Problem Relation Age of Onset  . Breast cancer Mother 55  . Colon polyps Mother        unspecified number  . Uterine cancer Mother        metastatic  . Breast cancer Maternal Grandmother 43  . Stroke Father 60  . Rheum arthritis Sister   . Breast cancer Sister 48       lump; (-)GT (breast/ova ca panel, GeneDx)  . Skin cancer Maternal Grandfather        unspecified type  . Prostate cancer Maternal Grandfather        dx. late 58s  . Breast cancer Other        dx. in their late 45s  . Breast cancer Other        dx. 78-79  . Breast cancer Cousin        bilateral; dx. late 3s  . Colon cancer Neg Hx   . Esophageal cancer Neg Hx   . Rectal cancer Neg Hx   . Stomach cancer Neg Hx     Review of Systems  All other systems reviewed and are negative.   Exam:   BP 128/66   Pulse 98   Temp (!) 97.5 F (36.4 C) (Temporal)   Resp 16   Ht _0  (1.626 m)   Wt 128 lb (58.1 kg)   LMP 09/19/2008   BMI 21.97 kg/m     General appearance: alert, cooperative and appears stated age Head: normocephalic, without obvious abnormality, atraumatic Neck: no adenopathy, supple, symmetrical, trachea midline and thyroid normal to inspection and palpation Lungs: clear to auscultation bilaterally Breasts: scars bilaterally, no masses or tenderness, No nipple retraction or dimpling, No nipple discharge or bleeding, No axillary adenopathy Heart: regular rate and rhythm Abdomen: soft, non-tender; no masses, no organomegaly Extremities: extremities normal, atraumatic, no cyanosis or edema Skin: skin color, texture, turgor normal. No rashes or lesions Lymph nodes: cervical,  supraclavicular, and axillary nodes normal. Neurologic: grossly normal  Pelvic: External genitalia:  no lesions  No abnormal inguinal nodes palpated.              Urethra:  normal appearing urethra with no masses, tenderness or lesions              Bartholins and Skenes: normal                 Vagina: normal appearing vagina with normal color and yellow-orange discharge, no lesions              Cervix: no lesions              Pap taken: No. Bimanual Exam:  Uterus:  normal size, contour, position, consistency, mobility, non-tender              Adnexa: no mass, fullness, tenderness              Rectal exam: Yes.  .  Confirms.              Anus:  normal sphincter tone, tender for patient.  She has a 4 mm raised skin colored nevus outside the anus at 8:00.   Chaperone was present for exam.  Assessment:   Well woman visit with normal exam. FH breast cancer.  Mother with breast cancer and metastatic uterine cancer following Tamoxifen.  Personal hx of fibroglandular breasts. Status post bilateral breast surgeries.  Increased lifetime risk of breast cancer.  Intolerant of Tamoxifen due to severe constipation.  Negative genetic screening.  IDDM.  Osteoporosis.  On Prolia.  Care through Dr. Forde Dandy.  Hx eosinophilia.  Mild GSI.  Bereavement.  Vaginitis.  Chronic constipation.   Plan: Mammogram screening discussed. Self breast awareness reviewed. Pap and HR HPV as above. Guidelines for Calcium, Vitamin D, regular exercise program including cardiovascular and weight bearing exercise. She will contact Dr. Forde Dandy about her chronic constipation and review possible medication options.  I did review increasing fruits, vegetables, and water in her diet.  Affirm.  FU with Dr. Jana Hakim in November.  Usually does yearly breast MRI after the visit with him.  Support given for the failing health of her mother.  I recommended Hospice as a resource for her and her family.  Follow up  annually and prn.   After visit summary provided.

## 2019-02-27 ENCOUNTER — Ambulatory Visit (INDEPENDENT_AMBULATORY_CARE_PROVIDER_SITE_OTHER): Payer: Medicare Other | Admitting: Obstetrics and Gynecology

## 2019-02-27 ENCOUNTER — Encounter: Payer: Self-pay | Admitting: Obstetrics and Gynecology

## 2019-02-27 ENCOUNTER — Other Ambulatory Visit: Payer: Self-pay

## 2019-02-27 VITALS — BP 128/66 | HR 98 | Temp 97.5°F | Resp 16 | Ht 64.0 in | Wt 128.0 lb

## 2019-02-27 DIAGNOSIS — N760 Acute vaginitis: Secondary | ICD-10-CM | POA: Diagnosis not present

## 2019-02-27 DIAGNOSIS — Z01419 Encounter for gynecological examination (general) (routine) without abnormal findings: Secondary | ICD-10-CM

## 2019-02-27 NOTE — Patient Instructions (Signed)
EXERCISE AND DIET:  We recommended that you start or continue a regular exercise program for good health. Regular exercise means any activity that makes your heart beat faster and makes you sweat.  We recommend exercising at least 30 minutes per day at least 3 days a week, preferably 4 or 5.  We also recommend a diet low in fat and sugar.  Inactivity, poor dietary choices and obesity can cause diabetes, heart attack, stroke, and kidney damage, among others.   ° °ALCOHOL AND SMOKING:  Women should limit their alcohol intake to no more than 7 drinks/beers/glasses of wine (combined, not each!) per week. Moderation of alcohol intake to this level decreases your risk of breast cancer and liver damage. And of course, no recreational drugs are part of a healthy lifestyle.  And absolutely no smoking or even second hand smoke. Most people know smoking can cause heart and lung diseases, but did you know it also contributes to weakening of your bones? Aging of your skin?  Yellowing of your teeth and nails? ° °CALCIUM AND VITAMIN D:  Adequate intake of calcium and Vitamin D are recommended.  The recommendations for exact amounts of these supplements seem to change often, but generally speaking 600 mg of calcium (either carbonate or citrate) and 800 units of Vitamin D per day seems prudent. Certain women may benefit from higher intake of Vitamin D.  If you are among these women, your doctor will have told you during your visit.   ° °PAP SMEARS:  Pap smears, to check for cervical cancer or precancers,  have traditionally been done yearly, although recent scientific advances have shown that most women can have pap smears less often.  However, every woman still should have a physical exam from her gynecologist every year. It will include a breast check, inspection of the vulva and vagina to check for abnormal growths or skin changes, a visual exam of the cervix, and then an exam to evaluate the size and shape of the uterus and  ovaries.  And after 62 years of age, a rectal exam is indicated to check for rectal cancers. We will also provide age appropriate advice regarding health maintenance, like when you should have certain vaccines, screening for sexually transmitted diseases, bone density testing, colonoscopy, mammograms, etc.  ° °MAMMOGRAMS:  All women over 40 years old should have a yearly mammogram. Many facilities now offer a "3D" mammogram, which may cost around $50 extra out of pocket. If possible,  we recommend you accept the option to have the 3D mammogram performed.  It both reduces the number of women who will be called back for extra views which then turn out to be normal, and it is better than the routine mammogram at detecting truly abnormal areas.   ° °COLONOSCOPY:  Colonoscopy to screen for colon cancer is recommended for all women at age 50.  We know, you hate the idea of the prep.  We agree, BUT, having colon cancer and not knowing it is worse!!  Colon cancer so often starts as a polyp that can be seen and removed at colonscopy, which can quite literally save your life!  And if your first colonoscopy is normal and you have no family history of colon cancer, most women don't have to have it again for 10 years.  Once every ten years, you can do something that may end up saving your life, right?  We will be happy to help you get it scheduled when you are ready.    Be sure to check your insurance coverage so you understand how much it will cost.  It may be covered as a preventative service at no cost, but you should check your particular policy.   ° ° ° °Vaginitis °Vaginitis is a condition in which the vaginal tissue swells and becomes red (inflamed). This condition is most often caused by a change in the normal balance of bacteria and yeast that live in the vagina. This change causes an overgrowth of certain bacteria or yeast, which causes the inflammation. There are different types of vaginitis, but the most common types  are: °· Bacterial vaginosis. °· Yeast infection (candidiasis). °· Trichomoniasis vaginitis. This is a sexually transmitted disease (STD). °· Viral vaginitis. °· Atrophic vaginitis. °· Allergic vaginitis. °What are the causes? °The cause of this condition depends on the type of vaginitis. It can be caused by: °· Bacteria (bacterial vaginosis). °· Yeast, which is a fungus (yeast infection). °· A parasite (trichomoniasis vaginitis). °· A virus (viral vaginitis). °· Low hormone levels (atrophic vaginitis). Low hormone levels can occur during pregnancy, breastfeeding, or after menopause. °· Irritants, such as bubble baths, scented tampons, and feminine sprays (allergic vaginitis). °Other factors can change the normal balance of the yeast and bacteria that live in the vagina. These include: °· Antibiotic medicines. °· Poor hygiene. °· Diaphragms, vaginal sponges, spermicides, birth control pills, and intrauterine devices (IUD). °· Sex. °· Infection. °· Uncontrolled diabetes. °· A weakened defense (immune) system. °What increases the risk? °This condition is more likely to develop in women who: °· Smoke. °· Use vaginal douches, scented tampons, or scented sanitary pads. °· Wear tight-fitting pants. °· Wear thong underwear. °· Use oral birth control pills or an IUD. °· Have sex without a condom. °· Have multiple sex partners. °· Have an STD. °· Frequently use the spermicide nonoxynol-9. °· Eat lots of foods high in sugar. °· Have uncontrolled diabetes. °· Have low estrogen levels. °· Have a weakened immune system from an immune disorder or medical treatment. °· Are pregnant or breastfeeding. °What are the signs or symptoms? °Symptoms vary depending on the cause of the vaginitis. Common symptoms include: °· Abnormal vaginal discharge. °? The discharge is white, gray, or yellow with bacterial vaginosis. °? The discharge is thick, white, and cheesy with a yeast infection. °? The discharge is frothy and yellow or greenish with  trichomoniasis. °· A bad vaginal smell. The smell is fishy with bacterial vaginosis. °· Vaginal itching, pain, or swelling. °· Sex that is painful. °· Pain or burning when urinating. °Sometimes there are no symptoms. °How is this diagnosed? °This condition is diagnosed based on your symptoms and medical history. A physical exam, including a pelvic exam, will also be done. You may also have other tests, including: °· Tests to determine the pH level (acidity or alkalinity) of your vagina. °· A whiff test, to assess the odor that results when a sample of your vaginal discharge is mixed with a potassium hydroxide solution. °· Tests of vaginal fluid. A sample will be examined under a microscope. °How is this treated? °Treatment varies depending on the type of vaginitis you have. Your treatment may include: °· Antibiotic creams or pills to treat bacterial vaginosis and trichomoniasis. °· Antifungal medicines, such as vaginal creams or suppositories, to treat a yeast infection. °· Medicine to ease discomfort if you have viral vaginitis. Your sexual partner should also be treated. °· Estrogen delivered in a cream, pill, suppository, or vaginal ring to treat atrophic vaginitis. If   vaginal dryness occurs, lubricants and moisturizing creams may help. You may need to avoid scented soaps, sprays, or douches. °· Stopping use of a product that is causing allergic vaginitis. Then using a vaginal cream to treat the symptoms. °Follow these instructions at home: °Lifestyle °· Keep your genital area clean and dry. Avoid soap, and only rinse the area with water. °· Do not douche or use tampons until your health care provider says it is okay to do so. Use sanitary pads, if needed. °· Do not have sex until your health care provider approves. When you can return to sex, practice safe sex and use condoms. °· Wipe from front to back. This avoids the spread of bacteria from the rectum to the vagina. °General instructions °· Take  over-the-counter and prescription medicines only as told by your health care provider. °· If you were prescribed an antibiotic medicine, take or use it as told by your health care provider. Do not stop taking or using the antibiotic even if you start to feel better. °· Keep all follow-up visits as told by your health care provider. This is important. °How is this prevented? °· Use mild, non-scented products. Do not use things that can irritate the vagina, such as fabric softeners. Avoid the following products if they are scented: °? Feminine sprays. °? Detergents. °? Tampons. °? Feminine hygiene products. °? Soaps or bubble baths. °· Let air reach your genital area. °? Wear cotton underwear to reduce moisture buildup. °? Avoid wearing underwear while you sleep. °? Avoid wearing tight pants and underwear or nylons without a cotton panel. °? Avoid wearing thong underwear. °· Take off any wet clothing, such as bathing suits, as soon as possible. °· Practice safe sex and use condoms. °Contact a health care provider if: °· You have abdominal pain. °· You have a fever. °· You have symptoms that last for more than 2-3 days. °Get help right away if: °· You have a fever and your symptoms suddenly get worse. °Summary °· Vaginitis is a condition in which the vaginal tissue becomes inflamed.This condition is most often caused by a change in the normal balance of bacteria and yeast that live in the vagina. °· Treatment varies depending on the type of vaginitis you have. °· Do not douche, use tampons , or have sex until your health care provider approves. When you can return to sex, practice safe sex and use condoms. °This information is not intended to replace advice given to you by your health care provider. Make sure you discuss any questions you have with your health care provider. °Document Released: 04/04/2007 Document Revised: 05/20/2017 Document Reviewed: 07/13/2016 °Elsevier Patient Education © 2020 [employer] ° °

## 2019-02-28 LAB — VAGINITIS/VAGINOSIS, DNA PROBE
Candida Species: NEGATIVE
Gardnerella vaginalis: NEGATIVE
Trichomonas vaginosis: NEGATIVE

## 2019-03-12 DIAGNOSIS — H61001 Unspecified perichondritis of right external ear: Secondary | ICD-10-CM | POA: Diagnosis not present

## 2019-03-12 DIAGNOSIS — L570 Actinic keratosis: Secondary | ICD-10-CM | POA: Diagnosis not present

## 2019-03-12 DIAGNOSIS — L821 Other seborrheic keratosis: Secondary | ICD-10-CM | POA: Diagnosis not present

## 2019-03-16 ENCOUNTER — Other Ambulatory Visit: Payer: Self-pay

## 2019-03-16 DIAGNOSIS — Z79899 Other long term (current) drug therapy: Secondary | ICD-10-CM

## 2019-03-16 DIAGNOSIS — Z23 Encounter for immunization: Secondary | ICD-10-CM | POA: Diagnosis not present

## 2019-03-17 LAB — COMPLETE METABOLIC PANEL WITH GFR
AG Ratio: 1.8 (calc) (ref 1.0–2.5)
ALT: 32 U/L — ABNORMAL HIGH (ref 6–29)
AST: 44 U/L — ABNORMAL HIGH (ref 10–35)
Albumin: 4.2 g/dL (ref 3.6–5.1)
Alkaline phosphatase (APISO): 61 U/L (ref 37–153)
BUN/Creatinine Ratio: 14 (calc) (ref 6–22)
BUN: 16 mg/dL (ref 7–25)
CO2: 24 mmol/L (ref 20–32)
Calcium: 9.8 mg/dL (ref 8.6–10.4)
Chloride: 105 mmol/L (ref 98–110)
Creat: 1.11 mg/dL — ABNORMAL HIGH (ref 0.50–0.99)
GFR, Est African American: 62 mL/min/{1.73_m2} (ref >=60)
GFR, Est Non African American: 53 mL/min/{1.73_m2} — ABNORMAL LOW (ref >=60)
Globulin: 2.4 g/dL (calc) (ref 1.9–3.7)
Glucose, Bld: 264 mg/dL — ABNORMAL HIGH (ref 65–99)
Potassium: 4.4 mmol/L (ref 3.5–5.3)
Sodium: 139 mmol/L (ref 135–146)
Total Bilirubin: 0.5 mg/dL (ref 0.2–1.2)
Total Protein: 6.6 g/dL (ref 6.1–8.1)

## 2019-03-17 LAB — CBC WITH DIFFERENTIAL/PLATELET
Absolute Monocytes: 237 cells/uL (ref 200–950)
Basophils Absolute: 88 cells/uL (ref 0–200)
Basophils Relative: 1.6 %
Eosinophils Absolute: 297 cells/uL (ref 15–500)
Eosinophils Relative: 5.4 %
HCT: 40.1 % (ref 35.0–45.0)
Hemoglobin: 13.4 g/dL (ref 11.7–15.5)
Lymphs Abs: 1326 cells/uL (ref 850–3900)
MCH: 30.2 pg (ref 27.0–33.0)
MCHC: 33.4 g/dL (ref 32.0–36.0)
MCV: 90.5 fL (ref 80.0–100.0)
MPV: 13.7 fL — ABNORMAL HIGH (ref 7.5–12.5)
Monocytes Relative: 4.3 %
Neutro Abs: 3553 cells/uL (ref 1500–7800)
Neutrophils Relative %: 64.6 %
Platelets: 264 10*3/uL (ref 140–400)
RBC: 4.43 10*6/uL (ref 3.80–5.10)
RDW: 13.9 % (ref 11.0–15.0)
Total Lymphocyte: 24.1 %
WBC: 5.5 10*3/uL (ref 3.8–10.8)

## 2019-03-19 NOTE — Progress Notes (Signed)
Patient is diabetic, her glucose is elevated.  A GFR is low and stable.  LFTs are elevated she is on statins.  Please notify patient and forward labs to her PCP.

## 2019-03-22 DIAGNOSIS — M542 Cervicalgia: Secondary | ICD-10-CM | POA: Diagnosis not present

## 2019-03-22 DIAGNOSIS — G43719 Chronic migraine without aura, intractable, without status migrainosus: Secondary | ICD-10-CM | POA: Diagnosis not present

## 2019-03-22 DIAGNOSIS — M791 Myalgia, unspecified site: Secondary | ICD-10-CM | POA: Diagnosis not present

## 2019-03-22 DIAGNOSIS — G518 Other disorders of facial nerve: Secondary | ICD-10-CM | POA: Diagnosis not present

## 2019-03-28 ENCOUNTER — Other Ambulatory Visit: Payer: Self-pay | Admitting: *Deleted

## 2019-03-28 MED ORDER — METHOTREXATE SODIUM CHEMO INJECTION 50 MG/2ML: 10.0000 mg | INTRAMUSCULAR | 0 refills | Status: DC

## 2019-03-28 NOTE — Telephone Encounter (Signed)
Refill request received via fax  Last Visit: 01/22/19 Next Visit: 06/25/19 Labs: 03/16/19 glucose is elevated. A GFR is low and stable. LFTs are elevated    Okay to refill per Dr. Estanislado Pandy

## 2019-03-30 DIAGNOSIS — H04123 Dry eye syndrome of bilateral lacrimal glands: Secondary | ICD-10-CM | POA: Diagnosis not present

## 2019-03-30 DIAGNOSIS — Z961 Presence of intraocular lens: Secondary | ICD-10-CM | POA: Diagnosis not present

## 2019-04-24 ENCOUNTER — Other Ambulatory Visit: Payer: Self-pay | Admitting: Oncology

## 2019-04-24 ENCOUNTER — Telehealth: Payer: Self-pay | Admitting: Oncology

## 2019-04-24 NOTE — Telephone Encounter (Signed)
appt already scheduled by RN per 11/3 sch message - pt is aware of appt date and time

## 2019-05-02 DIAGNOSIS — E1042 Type 1 diabetes mellitus with diabetic polyneuropathy: Secondary | ICD-10-CM | POA: Diagnosis not present

## 2019-05-02 DIAGNOSIS — M069 Rheumatoid arthritis, unspecified: Secondary | ICD-10-CM | POA: Diagnosis not present

## 2019-05-02 DIAGNOSIS — N1831 Chronic kidney disease, stage 3a: Secondary | ICD-10-CM | POA: Diagnosis not present

## 2019-05-02 DIAGNOSIS — E103493 Type 1 diabetes mellitus with severe nonproliferative diabetic retinopathy without macular edema, bilateral: Secondary | ICD-10-CM | POA: Diagnosis not present

## 2019-05-02 DIAGNOSIS — H3520 Other non-diabetic proliferative retinopathy, unspecified eye: Secondary | ICD-10-CM | POA: Diagnosis not present

## 2019-05-03 ENCOUNTER — Telehealth: Payer: Self-pay | Admitting: Rheumatology

## 2019-05-03 DIAGNOSIS — E109 Type 1 diabetes mellitus without complications: Secondary | ICD-10-CM | POA: Diagnosis not present

## 2019-05-03 DIAGNOSIS — Z79899 Other long term (current) drug therapy: Secondary | ICD-10-CM

## 2019-05-03 DIAGNOSIS — Z794 Long term (current) use of insulin: Secondary | ICD-10-CM | POA: Diagnosis not present

## 2019-05-03 MED ORDER — ENBREL SURECLICK 50 MG/ML ~~LOC~~ SOAJ: 50.0000 mg | SUBCUTANEOUS | 0 refills | Status: DC

## 2019-05-03 NOTE — Telephone Encounter (Signed)
Last Visit: 01/22/19 Next Visit: 06/25/19 Labs: 03/16/19 glucose is elevated. A GFR is low and stable. LFTs are elevated  TB Gold: 03/27/18 Neg   Okay to refill per Dr. Estanislado Pandy

## 2019-05-03 NOTE — Telephone Encounter (Signed)
Patient left a voicemail requesting a return call to discuss filling out her [employer] application for 2641 for her prescription of Enbrel.

## 2019-05-03 NOTE — Telephone Encounter (Signed)
Called and spoke to patient. She received renewal application in the mail. She will fill out her portion, then will bring by office for submission. Advised patient to call back with any questions.  2:38 PM Beatriz Chancellor, CPhT

## 2019-05-03 NOTE — Telephone Encounter (Signed)
Will from [employer] called stating patient needs a new prescription for Enbrel faxed to our office [phone].  If you have any questions, please call back at [phone]

## 2019-05-07 ENCOUNTER — Inpatient Hospital Stay: Payer: Medicare Other | Attending: Oncology

## 2019-05-07 ENCOUNTER — Other Ambulatory Visit: Payer: Self-pay

## 2019-05-07 DIAGNOSIS — M545 Low back pain: Secondary | ICD-10-CM | POA: Insufficient documentation

## 2019-05-07 DIAGNOSIS — M069 Rheumatoid arthritis, unspecified: Secondary | ICD-10-CM | POA: Diagnosis not present

## 2019-05-07 DIAGNOSIS — Z803 Family history of malignant neoplasm of breast: Secondary | ICD-10-CM | POA: Insufficient documentation

## 2019-05-07 DIAGNOSIS — I10 Essential (primary) hypertension: Secondary | ICD-10-CM | POA: Diagnosis not present

## 2019-05-07 DIAGNOSIS — M0579 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 without organ or systems involvement: Secondary | ICD-10-CM

## 2019-05-07 DIAGNOSIS — R922 Inconclusive mammogram: Secondary | ICD-10-CM | POA: Insufficient documentation

## 2019-05-07 DIAGNOSIS — D721 Eosinophilia, unspecified: Secondary | ICD-10-CM | POA: Diagnosis not present

## 2019-05-07 DIAGNOSIS — G620 Drug-induced polyneuropathy: Secondary | ICD-10-CM | POA: Insufficient documentation

## 2019-05-07 DIAGNOSIS — Z9189 Other specified personal risk factors, not elsewhere classified: Secondary | ICD-10-CM

## 2019-05-07 DIAGNOSIS — R82998 Other abnormal findings in urine: Secondary | ICD-10-CM | POA: Diagnosis not present

## 2019-05-07 DIAGNOSIS — Z808 Family history of malignant neoplasm of other organs or systems: Secondary | ICD-10-CM | POA: Insufficient documentation

## 2019-05-07 DIAGNOSIS — M3500 Sicca syndrome, unspecified: Secondary | ICD-10-CM | POA: Insufficient documentation

## 2019-05-07 DIAGNOSIS — Z1379 Encounter for other screening for genetic and chromosomal anomalies: Secondary | ICD-10-CM

## 2019-05-07 LAB — CBC WITH DIFFERENTIAL/PLATELET
Abs Immature Granulocytes: 0.02 10*3/uL (ref 0.00–0.07)
Basophils Absolute: 0.1 10*3/uL (ref 0.0–0.1)
Basophils Relative: 1 %
Eosinophils Absolute: 0.4 10*3/uL (ref 0.0–0.5)
Eosinophils Relative: 7 %
HCT: 39 % (ref 36.0–46.0)
Hemoglobin: 12.6 g/dL (ref 12.0–15.0)
Immature Granulocytes: 0 %
Lymphocytes Relative: 22 %
Lymphs Abs: 1.3 10*3/uL (ref 0.7–4.0)
MCH: 31 pg (ref 26.0–34.0)
MCHC: 32.3 g/dL (ref 30.0–36.0)
MCV: 95.8 fL (ref 80.0–100.0)
Monocytes Absolute: 0.4 10*3/uL (ref 0.1–1.0)
Monocytes Relative: 6 %
Neutro Abs: 3.8 10*3/uL (ref 1.7–7.7)
Neutrophils Relative %: 64 %
Platelets: 236 10*3/uL (ref 150–400)
RBC: 4.07 MIL/uL (ref 3.87–5.11)
RDW: 15.7 % — ABNORMAL HIGH (ref 11.5–15.5)
WBC: 6 10*3/uL (ref 4.0–10.5)
nRBC: 0 % (ref 0.0–0.2)

## 2019-05-07 NOTE — Telephone Encounter (Signed)
Patient dropped off renewal application, will fax once MD signature is received.  3:13 PM Beatriz Chancellor, CPhT

## 2019-05-10 NOTE — Telephone Encounter (Signed)
Completed application faxed to [employer].  Will update when we hear a response.  Mariella Saa, PharmD, [place], [place] [facility] [phone]  05/10/2019 9:10 AM

## 2019-05-13 NOTE — Progress Notes (Signed)
[place]  Telephone:([phone] Fax:(336) [phone]     ID: Becky [name] DOB: 07/01/1955  MR#: [id number]  CVE#:[id number]  Patient Care Team: Reynold Bowen, MD as PCP - General (Endocrinology) Yisroel Ramming, Everardo All, MD as Consulting Physician (Obstetrics and Gynecology) , Virgie Dad, MD as Consulting Physician (Oncology) Pieter Partridge, DO as Consulting Physician (Neurology) Yisroel Ramming, Everardo All, MD as Consulting Physician (Obstetrics and Gynecology) OTHER MD:  CHIEF COMPLAINT: breast cancer high risk  CURRENT TREATMENT: intensified screening    INTERVAL HISTORY: Becky [name] returns today for follow-up of her high risk breast cancer history. She is under intensified screening.   Since her last visit, she underwent bilateral screening mammography with tomography at [facility] on 01/26/2019 showing: breast density category D; no evidence of malignancy in either breast.  She also presented to the ED on 12/09/2018 with a 2 week history of fever and altered mental status. Chest CT performed that day showed no active disease. She was admitted, treated for complicated UTI, and discharged on 12/13/2018.  REVIEW OF SYSTEMS: Becky [name] is very concerned that the immunosuppression she receives to control her rheumatoid arthritis may cause lymphoma.  We discussed that at length today.  She is exercising by walking 1 or 2 miles most days.  She also does some weights at home.  She has a history of low back pain and has severe degenerative disc disease documented in that area.  She tells me her sister was recently diagnosed with Covid.  She is very worried about her mother who in addition to now endometrial cancer has significant neuropathy issues.  Becky [name] is very careful regarding the pandemic and she says she gets anxiety attacks just thinking about it.  Aside from all this a detailed review of systems today was stable  HISTORY OF PRESENT ILLNESS: From the initial intake note:    Becky [name] was separately referred to the [facility] by Dr Forde Dandy for evaluation of eosinophilia and by Dr Quincy Simmonds for genetics counseling. It is best to discuss these unrelated problem separately:  Eosinophilia: We have a CBC with differential dating back to 01/26/2012. This showed WBC 6.1, Hb 11.9 and platelets 248. Absolute eosinophil count was 1.0. At that time the patient was worked up for atypical chest [pain, with an equivocal Myoview. She underwent catheterization 01/28/2012 which showed normal coronaries. Chest X-ray 01/26/2012 showed no infiltrates. There are multiple other CBC reports, all normal, but w/o differentials. We just repeated a CBC/diff here, which shows WBC 5.7, Hb 13.3, platelets 265, and abs eosiniphil count  1.2 (normal to 0.5 ).  The patient has a significant rheumatoid history including rheumatoid arthritis and Sjogren's syndrome, followed by Dr Tomi Likens. She tells me the arthritis is well controlled. Currently she denies rash, difficulty swallowing or painful swallowing, gastritis or history of ulcers; denies cough, phlegm production, pleurisy, or history of repeated URIs or PNA; or changes in bowel habits. She underwent biopsy of an anterior neck mass 10/13/2009 which showed only reactive lymphoid hyperplasia([id number]). She denies travel outside the country, fevers, unexplained weight loss of unexplained fatigue.  High Risk for Breast Cancer: The patient's mother, currently 27, was diagnosed with breast cancer at age 17. She also has a history of colon polyps. The patient's father is 68, with no history of cancer. The patient's only full sister, was diagnosed with breast cancer at age 36. She had a negative test through the breast or ovarian cancer panel. Further on the mother's side there  is one cousin with "female cancer" diagnosed in her 31s, and another with cervical cancer in the 25s. The patient's maternal grandmother was diagnosed with breast cancer at age 39. That  grandmother had 3 sisters 2 of whom were diagnosed with breast cancers in their 85s. (The third sister died in her 71s from surgical complications not related to cancer).-- In addition, the patient has breast density category C/D. Her breasts are lumpy making screening exam additionally difficult.  Biopsy of several right breast masses 10/09/1998 showed no evidence of malignancy ([id number])  Her subsequent history is as detailed below.   PAST MEDICAL HISTORY: Past Medical History:  Diagnosis Date  . Abnormal ECG   . Anemia   . Anxiety   . Cataract    . Cateracts bil eyes removed  . GERD (gastroesophageal reflux disease)   . Heart murmur   . Hypercholesterolemia   . IDDM (insulin dependent diabetes mellitus) ([place]) 1969  . Migraine headache   . Neuromuscular disorder (HCC)    bil legs & feet - neuropathy  . Neuropathy   . Osteoporosis    10/2012  . Otosclerosis   . RA (rheumatoid arthritis) ([place]) 2000  . S/P cardiac cath August 2013   Normal coronaries and normal LV function.  . Sjoegren syndrome     PAST SURGICAL HISTORY: Past Surgical History:  Procedure Laterality Date  . BREAST LUMPECTOMY  1995  . CARPAL TUNNEL RELEASE  2000  . CATARACT EXTRACTION Right 09-05-12  . CATARACT EXTRACTION Left 3/15  . CESAREAN SECTION  1987  . COLONOSCOPY    . EYE SURGERY     diabetic retinopathy   . HYSTEROSCOPY  02/2001   and D & C  . LYMPH NODE DISSECTION  2002  . TOE SURGERY  2000   bone removed    FAMILY HISTORY Family History  Problem Relation Age of Onset  . Breast cancer Mother 30  . Colon polyps Mother        unspecified number  . Uterine cancer Mother        metastatic  . Breast cancer Maternal Grandmother 20  . Stroke Father 30  . Rheum arthritis Sister   . Breast cancer Sister 48       lump; (-)GT (breast/ova ca panel, [facility])  . Skin cancer Maternal Grandfather        unspecified type  . Prostate cancer Maternal Grandfather        dx. late 78s  . Breast  cancer Other        dx. in their late 20s  . Breast cancer Other        dx. 78-79  . Breast cancer Cousin        bilateral; dx. late 23s  . Colon cancer Neg Hx   . Esophageal cancer Neg Hx   . Rectal cancer Neg Hx   . Stomach cancer Neg Hx    (see also history of present illness above).    GYNECOLOGIC HISTORY:  Patient's last menstrual period was 09/19/2008.  Menarche age 17, first live birth age 67, she is GX P1, with the change of life occurring at age 60. She did not take hormone replacement. 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 Her husband Luciana Axe works as a Games developer. More specifically he does interior trim for [employer], so he does a lot of traveling. The patient's daughter is a  Programme researcher, broadcasting/film/video and works in [place]. She got married in September 2016.  The patient's mother Charlott Holler is my patient currently with endometrial cancer.  The patient Sister with breast cancer is also my patient.  The patient is not a church attender.     HEALTH MAINTENANCE: Social History   Tobacco Use  . Smoking status: Never Smoker  . Smokeless tobacco: Never Used  Substance Use Topics  . Alcohol use: Yes    Alcohol/week: 4.0 standard drinks    Types: 2 Shots of liquor, 2 Standard drinks or equivalent per week    Comment: maybe 1-2 cocktails per wk  . Drug use: No     Colonoscopy: 2012/ [facility]  PAP:  Bone density:   On forteo  Lipid panel:  Allergies  Allergen Reactions  . Macrobid [Nitrofurantoin Macrocrystal] Hives  . Ciprofloxacin Rash  . Penicillins Rash    Current Outpatient Medications  Medication Sig Dispense Refill  . aspirin 81 MG tablet Take 81 mg by mouth daily.    . calcium carbonate (OS-CAL) 600 MG TABS Take 600 mg by mouth 2 (two) times daily with a meal.     . CONTOUR NEXT TEST test strip   6  . cyclobenzaprine (FLEXERIL) 10 MG tablet Take 10 mg  by mouth 3 (three) times daily as needed for muscle spasms. Reported on 07/03/2015    . denosumab (PROLIA) 60 MG/ML SOSY injection Inject 60 mg into the skin every 6 (six) months. Please deliver to clinic 1 Syringe 0  . DULoxetine (CYMBALTA) 60 MG capsule Take 60 mg by mouth daily.    Marland Kitchen etanercept (ENBREL SURECLICK) 50 MG/ML injection Inject 0.98 mLs (50 mg total) into the skin once a week. 12 pen 0  . folic acid (FOLVITE) 1 MG tablet Take 2 tablets (2 mg total) by mouth daily. 180 tablet 3  . furosemide (LASIX) 20 MG tablet Take 10 mg by mouth daily.     . insulin aspart (NOVOLOG) 100 UNIT/ML injection Insulin pump    . losartan (COZAAR) 25 MG tablet Take 25 mg by mouth daily.     . methotrexate 50 MG/2ML injection Inject 0.4 mLs (10 mg total) into the skin once a week. 5 mL 0  . Multiple Vitamin (MULTIVITAMIN) tablet Take 1 tablet by mouth daily.    . polyethylene glycol (MIRALAX / GLYCOLAX) 17 g packet Take 17 g by mouth 2 (two) times daily.     . pravastatin (PRAVACHOL) 20 MG tablet Take 20 mg by mouth daily.     . SUMAtriptan (IMITREX) 100 MG tablet Take 100 mg by mouth every 2 (two) hours as needed.    . topiramate (TOPAMAX) 200 MG tablet Take 200 mg by mouth daily.     . Tuberculin-Allergy Syringes 27G X 1/2" 1 ML KIT To be used with weekly methotrexate injections once weekly. 12 kit 3  . vitamin B-12 (CYANOCOBALAMIN) 1000 MCG tablet Take 1,000 mcg by mouth daily.     No current facility-administered medications for this visit.     OBJECTIVE: Middle-aged white woman who appears stated age  63:   05/14/19 1320  BP: (!) 123/56  Pulse: 89  Resp: 18  Temp: (!) 97.3 F (36.3 C)  SpO2: 100%     Body mass index is 21.66 kg/m.    ECOG FS:1 - Symptomatic but completely ambulatory  Sclerae unicteric, EOMs intact Wearing a mask No cervical or supraclavicular adenopathy Lungs no rales or rhonchi Heart regular rate and rhythm  Abd soft, nontender, positive bowel sounds MSK no focal  spinal tenderness, minimal RA changes over the hand joints Neuro: nonfocal, well oriented, appropriate affect Breasts: She has extremely lumpy breasts, the right if anything more than the left.  It would not be possible to tell whether any of this was cancer since the bumps are quite firm.  There are no skin changes of concern.  Both axillae are benign.   CMP     Component Value Date/Time   NA 139 03/16/2019 1344   NA 142 01/05/2017 1257   K 4.4 03/16/2019 1344   K 4.3 01/05/2017 1257   CL 105 03/16/2019 1344   CO2 24 03/16/2019 1344   CO2 24 01/05/2017 1257   GLUCOSE 264 (H) 03/16/2019 1344   GLUCOSE 95 01/05/2017 1257   BUN 16 03/16/2019 1344   BUN 11.4 01/05/2017 1257   CREATININE 1.11 (H) 03/16/2019 1344   CREATININE 1.0 01/05/2017 1257   CALCIUM 9.8 03/16/2019 1344   CALCIUM 9.7 01/05/2017 1257   PROT 6.6 03/16/2019 1344   PROT 7.1 01/05/2017 1257   ALBUMIN 3.5 12/09/2018 2259   ALBUMIN 3.9 01/05/2017 1257   AST 44 (H) 03/16/2019 1344   AST 36 05/01/2018 1236   AST 24 01/05/2017 1257   ALT 32 (H) 03/16/2019 1344   ALT 34 05/01/2018 1236   ALT 18 01/05/2017 1257   ALKPHOS 61 12/09/2018 2259   ALKPHOS 67 01/05/2017 1257   BILITOT 0.5 03/16/2019 1344   BILITOT 0.5 05/01/2018 1236   BILITOT 0.26 01/05/2017 1257   GFRNONAA 53 (L) 03/16/2019 1344   GFRAA 62 03/16/2019 1344    INo results found for: SPEP, UPEP  Lab Results  Component Value Date   WBC 6.0 05/07/2019   NEUTROABS 3.8 05/07/2019   HGB 12.6 05/07/2019   HCT 39.0 05/07/2019   MCV 95.8 05/07/2019   PLT 236 05/07/2019      Chemistry      Component Value Date/Time   NA 139 03/16/2019 1344   NA 142 01/05/2017 1257   K 4.4 03/16/2019 1344   K 4.3 01/05/2017 1257   CL 105 03/16/2019 1344   CO2 24 03/16/2019 1344   CO2 24 01/05/2017 1257   BUN 16 03/16/2019 1344   BUN 11.4 01/05/2017 1257   CREATININE 1.11 (H) 03/16/2019 1344   CREATININE 1.0 01/05/2017 1257      Component Value Date/Time    CALCIUM 9.8 03/16/2019 1344   CALCIUM 9.7 01/05/2017 1257   ALKPHOS 61 12/09/2018 2259   ALKPHOS 67 01/05/2017 1257   AST 44 (H) 03/16/2019 1344   AST 36 05/01/2018 1236   AST 24 01/05/2017 1257   ALT 32 (H) 03/16/2019 1344   ALT 34 05/01/2018 1236   ALT 18 01/05/2017 1257   BILITOT 0.5 03/16/2019 1344   BILITOT 0.5 05/01/2018 1236   BILITOT 0.26 01/05/2017 1257       No results found for: LABCA2  No components found for: LABCA125  No results for input(s): INR in the last 168 hours.  Urinalysis    Component Value Date/Time   COLORURINE YELLOW 12/09/2018 2233   APPEARANCEUR CLEAR 12/09/2018 2233   LABSPEC 1.015 12/09/2018 2233   PHURINE 7.0 12/09/2018 2233   GLUCOSEU 250 (A) 12/09/2018 2233   HGBUR TRACE (A) 12/09/2018 2233   BILIRUBINUR NEGATIVE 12/09/2018 2233   BILIRUBINUR n 08/02/2013 1542   KETONESUR 40 (A) 12/09/2018 2233   PROTEINUR NEGATIVE 12/09/2018 2233   UROBILINOGEN negative 08/02/2013 1542  NITRITE NEGATIVE 12/09/2018 2233   LEUKOCYTESUR TRACE (A) 12/09/2018 2233    STUDIES: No results found.   ASSESSMENT: 63 y.o. [place], [place] woman at high risk for breast cancer, with a history of eosinophilia in the setting of rheumatoid arthritis and Sjogren's syndrome  (1) genetics testing on 04/15/15 through the [place] Panel through [facility] found no deleterious mutations in M, BARD1, BRCA1, BRCA2, BRIP1, CDH1, CHEK2, FANCC, MLH1, MSH2, MSH6, NBN, PALB2, PMS2, PTEN, RAD51C, RAD51D, TP53, and XRCC2. This panel also includes deletion/duplication analysis (without sequencing) for EPCAM.  (2)  benign eosinophilia likely associated with rheumatoid arthritis or secondary to medication: resolved  (3) breast cancer high risk:  (a) intolerant of tamoxifen (constipation)  (b) screening intensitifcation with yearly breast MRI starting Oct 2017    PLAN: Becky [name] continues under intensified screening.  She has category D density breasts  which means mammography is close to useless (it would show new calcifications but that would be about it).  Accordingly we are going to obtain an MRI of the breast within the next month or 2 and continue that on a yearly basis.  We discussed her use of Enbrel and she understands that if she did develop a lymphoma associated with that medication frequently stopping the medication is all that it takes to control the problem.  This was reassuring to her.  I certainly would not discontinue the Enbrel which is working quite well for her.  She is very concerned about her mother and wonders if her mother would benefit from citalopram which she finds helpful for her own peripheral neuropathy  She is taking appropriate pandemic precautions  She knows to call for any other issue that may develop before the next visit.   Chauncey Cruel, MD   05/14/2019 1:21 PM Medical Oncology and Hematology [facility] [place],  [address] Tel. [phone]    Fax. [phone]   I, Wilburn Mylar am acting as scribe for Dr. Virgie Dad. .  I, Lurline Del MD, have reviewed the above documentation for accuracy and completeness, and I agree with the above.

## 2019-05-14 ENCOUNTER — Inpatient Hospital Stay: Payer: Medicare Other | Admitting: Oncology

## 2019-05-14 ENCOUNTER — Other Ambulatory Visit: Payer: Self-pay

## 2019-05-14 VITALS — BP 123/56 | HR 89 | Temp 97.3°F | Resp 18 | Ht 64.0 in | Wt 126.2 lb

## 2019-05-14 DIAGNOSIS — M069 Rheumatoid arthritis, unspecified: Secondary | ICD-10-CM | POA: Diagnosis not present

## 2019-05-14 DIAGNOSIS — Z808 Family history of malignant neoplasm of other organs or systems: Secondary | ICD-10-CM | POA: Diagnosis not present

## 2019-05-14 DIAGNOSIS — D721 Eosinophilia, unspecified: Secondary | ICD-10-CM

## 2019-05-14 DIAGNOSIS — M0579 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 without organ or systems involvement: Secondary | ICD-10-CM

## 2019-05-14 DIAGNOSIS — M3500 Sicca syndrome, unspecified: Secondary | ICD-10-CM | POA: Diagnosis not present

## 2019-05-14 DIAGNOSIS — R922 Inconclusive mammogram: Secondary | ICD-10-CM | POA: Diagnosis not present

## 2019-05-14 DIAGNOSIS — Z8739 Personal history of other diseases of the musculoskeletal system and connective tissue: Secondary | ICD-10-CM | POA: Diagnosis not present

## 2019-05-14 DIAGNOSIS — Z8669 Personal history of other diseases of the nervous system and sense organs: Secondary | ICD-10-CM

## 2019-05-14 DIAGNOSIS — Z803 Family history of malignant neoplasm of breast: Secondary | ICD-10-CM | POA: Diagnosis not present

## 2019-05-14 DIAGNOSIS — M545 Low back pain: Secondary | ICD-10-CM | POA: Diagnosis not present

## 2019-05-14 DIAGNOSIS — G620 Drug-induced polyneuropathy: Secondary | ICD-10-CM | POA: Diagnosis not present

## 2019-05-15 ENCOUNTER — Telehealth: Payer: Self-pay | Admitting: Oncology

## 2019-05-15 NOTE — Telephone Encounter (Signed)
I talk with patient regarding schedule  

## 2019-05-22 ENCOUNTER — Telehealth: Payer: Self-pay | Admitting: Pharmacy Technician

## 2019-05-22 NOTE — Telephone Encounter (Signed)
Submitted a Prior Authorization request to [employer] for ENBREL via Cover My Meds. Will update once we receive a response.  9:46 AM Beatriz Chancellor, CPhT

## 2019-05-25 ENCOUNTER — Telehealth: Payer: Self-pay

## 2019-05-25 MED ORDER — FOLIC ACID 1 MG PO TABS: 2.0000 mg | ORAL_TABLET | Freq: Every day | ORAL | 3 refills | Status: DC

## 2019-05-25 NOTE — Telephone Encounter (Signed)
Refill request received via fax from [facility].   Last Visit: 01/22/2019 Next Visit: 06/25/2019  Okay to refill per Dr. Estanislado Pandy.

## 2019-05-28 DIAGNOSIS — M791 Myalgia, unspecified site: Secondary | ICD-10-CM | POA: Diagnosis not present

## 2019-05-28 DIAGNOSIS — G43719 Chronic migraine without aura, intractable, without status migrainosus: Secondary | ICD-10-CM | POA: Diagnosis not present

## 2019-05-28 DIAGNOSIS — G518 Other disorders of facial nerve: Secondary | ICD-10-CM | POA: Diagnosis not present

## 2019-05-28 DIAGNOSIS — M542 Cervicalgia: Secondary | ICD-10-CM | POA: Diagnosis not present

## 2019-05-28 NOTE — Telephone Encounter (Signed)
Patient dated PAP application with her birthdate. Corrected and faxed back to [employer].

## 2019-05-28 NOTE — Telephone Encounter (Signed)
Received fax that patient's PA from last year is still valid until 12/14/19. Will fax to [place] for PAP application

## 2019-06-06 ENCOUNTER — Other Ambulatory Visit: Payer: Medicare Other

## 2019-06-07 ENCOUNTER — Telehealth: Payer: Self-pay | Admitting: Rheumatology

## 2019-06-07 NOTE — Telephone Encounter (Signed)
Called [employer], rep advised they could not accept the statement the insurance previously sent over for patient's Enbrel Prior Authorization, even though it had all of the information on there.  Called patient's insurance and they will fax over a more formal prior authorization letter. Called patient and advised that we are working on it. Patient had no other questions or concerns.  10:41 AM Beatriz Chancellor, CPhT

## 2019-06-07 NOTE — Telephone Encounter (Signed)
Patient called stating she received an email 2 days ago from [employer] stating they are still waiting on a prior authorization from Dr. Arlean Hopping office.  Patient is requesting a return call.

## 2019-06-13 NOTE — Telephone Encounter (Signed)
Received a fax from  [place] regarding an approval for ENBREL from 06/22/2019 to 06/20/2020.   Will send document to scan center.   Mariella Saa, PharmD, [place], [place] Clinical Specialty Pharmacist [phone]  06/13/2019 1:11 PM

## 2019-06-13 NOTE — Telephone Encounter (Signed)
Received a fax from  [place] regarding an approval for ENBREL from 06/22/2019 to 06/20/2020.   Will send document to scan center.   Mariella Saa, PharmD, [place], [place] Clinical Specialty Pharmacist [phone]  06/13/2019 1:10 PM

## 2019-06-21 ENCOUNTER — Encounter

## 2019-06-25 ENCOUNTER — Ambulatory Visit: Payer: Medicare Other | Admitting: Physician Assistant

## 2019-07-02 ENCOUNTER — Other Ambulatory Visit: Payer: Self-pay

## 2019-07-02 ENCOUNTER — Ambulatory Visit
Admission: RE | Admit: 2019-07-02 | Discharge: 2019-07-02 | Disposition: A | Discharge status: Home / Self Care | Payer: Medicare Other | Source: Ambulatory Visit | Attending: Oncology | Admitting: Oncology

## 2019-07-02 DIAGNOSIS — M0579 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 without organ or systems involvement: Secondary | ICD-10-CM

## 2019-07-02 DIAGNOSIS — Z803 Family history of malignant neoplasm of breast: Secondary | ICD-10-CM | POA: Diagnosis not present

## 2019-07-02 DIAGNOSIS — M3500 Sicca syndrome, unspecified: Secondary | ICD-10-CM

## 2019-07-02 DIAGNOSIS — Z8669 Personal history of other diseases of the nervous system and sense organs: Secondary | ICD-10-CM

## 2019-07-02 DIAGNOSIS — Z8739 Personal history of other diseases of the musculoskeletal system and connective tissue: Secondary | ICD-10-CM

## 2019-07-02 DIAGNOSIS — D721 Eosinophilia, unspecified: Secondary | ICD-10-CM

## 2019-07-02 MED ORDER — GADOBENATE DIMEGLUMINE 529 MG/ML IV SOLN
6.0000 mL | Freq: Once | INTRAVENOUS | Status: AC | PRN
  Administered 2019-07-02: 12:00:00 6 mL via INTRAVENOUS

## 2019-07-03 ENCOUNTER — Encounter: Payer: Self-pay | Admitting: Oncology

## 2019-07-10 ENCOUNTER — Telehealth: Payer: Self-pay | Admitting: *Deleted

## 2019-07-10 NOTE — Telephone Encounter (Signed)
This RN called result of MRI of breast performed last week per her call.

## 2019-07-11 ENCOUNTER — Ambulatory Visit: Payer: Medicare Other | Attending: Internal Medicine

## 2019-07-11 DIAGNOSIS — Z23 Encounter for immunization: Secondary | ICD-10-CM | POA: Insufficient documentation

## 2019-07-11 NOTE — Progress Notes (Signed)
   Covid-19 Vaccination Clinic  Name:  Becky [name]    MRN: [id number] DOB: 1955/11/07  07/11/2019  [name] was observed post Covid-19 immunization for 15 minutes without incidence. She was provided with Vaccine Information Sheet and instruction to access the V-Safe system.   [name] was instructed to call 911 with any severe reactions post vaccine: Marland Kitchen Difficulty breathing  . Swelling of your face and throat  . A fast heartbeat  . A bad rash all over your body  . Dizziness and weakness    Immunizations Administered    Name Date Dose VIS Date Route   Pfizer COVID-19 Vaccine 07/11/2019  3:56 PM 0.3 mL 06/01/2019 Intramuscular   Manufacturer: [employer], [employer]   Lot: [id number]   NDC: [address]-4

## 2019-07-16 DIAGNOSIS — G518 Other disorders of facial nerve: Secondary | ICD-10-CM | POA: Diagnosis not present

## 2019-07-16 DIAGNOSIS — M791 Myalgia, unspecified site: Secondary | ICD-10-CM | POA: Diagnosis not present

## 2019-07-16 DIAGNOSIS — G43719 Chronic migraine without aura, intractable, without status migrainosus: Secondary | ICD-10-CM | POA: Diagnosis not present

## 2019-07-16 DIAGNOSIS — M542 Cervicalgia: Secondary | ICD-10-CM | POA: Diagnosis not present

## 2019-08-01 ENCOUNTER — Ambulatory Visit: Payer: Medicare Other | Attending: Internal Medicine

## 2019-08-01 ENCOUNTER — Other Ambulatory Visit: Payer: Self-pay | Admitting: Rheumatology

## 2019-08-01 DIAGNOSIS — Z111 Encounter for screening for respiratory tuberculosis: Secondary | ICD-10-CM | POA: Diagnosis not present

## 2019-08-01 DIAGNOSIS — Z79899 Other long term (current) drug therapy: Secondary | ICD-10-CM | POA: Diagnosis not present

## 2019-08-01 DIAGNOSIS — Z23 Encounter for immunization: Secondary | ICD-10-CM | POA: Insufficient documentation

## 2019-08-01 NOTE — Telephone Encounter (Signed)
Last Visit: 01/22/19 Next Visit: 08/08/19 Labs: CBC 05/07/19 RDW 15.7 , CMP 03/16/19 glucose 264, Creat. 1.11, GFR 53 TB Gold: 03/27/18   Patient updated labs in office today.  Okay to refill 30 day supply Enbrel?

## 2019-08-01 NOTE — Telephone Encounter (Signed)
atient request a refill on Enbrel sent to [facility].

## 2019-08-01 NOTE — Telephone Encounter (Signed)
CBC and CMP should result tomorrow and TB gold hopefully will result by Friday, so please ask if the patient would like a 30 day supply sent or if she would like to wait until her labs have resulted so we can send a 90-day supply if her labs are stable

## 2019-08-01 NOTE — Progress Notes (Signed)
   Covid-19 Vaccination Clinic  Name:  Becky [name]    MRN: [id number] DOB: [birth year]  08/01/2019  [name] was observed post Covid-19 immunization for 15 minutes without incidence. She was provided with Vaccine Information Sheet and instruction to access the V-Safe system.   [name] was instructed to call 911 with any severe reactions post vaccine: Marland Kitchen Difficulty breathing  . Swelling of your face and throat  . A fast heartbeat  . A bad rash all over your body  . Dizziness and weakness    Immunizations Administered    Name Date Dose VIS Date Route   Pfizer COVID-19 Vaccine 08/01/2019  3:18 PM 0.3 mL 06/01/2019 Intramuscular   Manufacturer: [employer], [employer]   Lot: [id number]   NDC: [address]-8

## 2019-08-02 NOTE — Telephone Encounter (Signed)
CBC and CMP is stable.

## 2019-08-02 NOTE — Progress Notes (Deleted)
Office Visit Note  [name]             Date of Birth: 03-16-1956           MRN: [id number]             PCP: Adrian Prince, MD Referring: Adrian Prince, MD Visit Date: 08/08/2019 Occupation: @GUAROCC @  Subjective:  No chief complaint on file.   History of Present Illness: Becky [name] is a 64 y.o. female ***   Activities of Daily Living:  Patient reports morning stiffness for *** {minute/hour:19697}.   Patient {ACTIONS;DENIES/REPORTS:21021675::"Denies"} nocturnal pain.  Difficulty dressing/grooming: {ACTIONS;DENIES/REPORTS:21021675::"Denies"} Difficulty climbing stairs: {ACTIONS;DENIES/REPORTS:21021675::"Denies"} Difficulty getting out of chair: {ACTIONS;DENIES/REPORTS:21021675::"Denies"} Difficulty using hands for taps, buttons, cutlery, and/or writing: {ACTIONS;DENIES/REPORTS:21021675::"Denies"}  No Rheumatology ROS completed.   PMFS History:  Patient Active Problem List   Diagnosis Date Noted  . Complicated UTI (urinary tract infection) 12/13/2018  . Failure of outpatient treatment 12/11/2018  . Acute metabolic encephalopathy 12/11/2018  . Fever 12/10/2018  . Breast cancer screening, high risk patient 05/09/2018  . Eosinophilia 01/31/2017  . Iritis 01/04/2017  . History of joint swelling 08/24/2016  . Osteoarthritis of knees, bilateral 08/24/2016  . History of migraine 08/24/2016  . History of depression 08/24/2016  . History of osteoporosis 08/24/2016  . DDD (degenerative disc disease), lumbar 08/24/2016  . High risk medication use 08/24/2016  . Trigger finger, left middle finger 08/24/2016  . Trigger finger, right ring finger 08/24/2016  . 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 Increased risk of breast cancer 07/06/2015  . IDDM (insulin dependent diabetes mellitus) 07/06/2015  . 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 Sjogren's syndrome (HCC) 07/06/2015  .  Genetic testing 05/12/2015  . Family history of breast cancer 04/15/2015  . Fibrocystic breast disease 10/11/2012  . GERD 10/01/2009  . Chest pain with moderate risk for cardiac etiology 09/02/2009  . DYSPHAGIA 09/02/2009  . ANXIETY 08/15/2009  . ULCER-GASTRIC 08/15/2009  . CONSTIPATION 08/15/2009  . SLOW TRANSIT CONSTIPATION 08/15/2009  . FLATULENCE ERUCTATION AND GAS PAIN 08/15/2009    Past Medical History:  Diagnosis Date  . Abnormal ECG   . Anemia   . Anxiety   . Cataract    . Cateracts bil eyes removed  . GERD (gastroesophageal reflux disease)   . Heart murmur   . Hypercholesterolemia   . IDDM (insulin dependent diabetes mellitus) (HCC) 1969  . Migraine headache   . Neuromuscular disorder (HCC)    bil legs & feet - neuropathy  . Neuropathy   . Osteoporosis    10/2012  . Otosclerosis   . RA (rheumatoid arthritis) (HCC) 2000  . S/P cardiac cath August 2013   Normal coronaries and normal LV function.  . Sjoegren syndrome     Family History  Problem Relation Age of Onset  . Breast cancer Mother 71  . Colon polyps Mother        unspecified number  . Uterine cancer Mother        metastatic  . Breast cancer Maternal Grandmother 43  . Stroke Father 21  . Rheum arthritis Sister   . Breast cancer Sister 48       lump; (-)GT (breast/ova ca panel, [facility])  . Skin cancer Maternal Grandfather        unspecified type  . Prostate cancer Maternal Grandfather        dx. late 83s  . Breast cancer Other  dx. in their late 57s  . Breast cancer Other        dx. 78-79  . Breast cancer Cousin        bilateral; dx. late 19s  . Colon cancer Neg Hx   . Esophageal cancer Neg Hx   . Rectal cancer Neg Hx   . Stomach cancer Neg Hx    Past Surgical History:  Procedure Laterality Date  . BREAST LUMPECTOMY  1995  . CARPAL TUNNEL RELEASE  2000  . CATARACT EXTRACTION Right 09-05-12  . CATARACT EXTRACTION Left 3/15  . CESAREAN SECTION  1987  . COLONOSCOPY    . EYE SURGERY       diabetic retinopathy   . HYSTEROSCOPY  02/2001   and D & C  . LYMPH NODE DISSECTION  2002  . TOE SURGERY  2000   bone removed   Social History   Social History Narrative  . Not on file   Immunization History  Administered Date(s) Administered  . PFIZER SARS-COV-2 Vaccination 07/11/2019, 08/01/2019  . Tdap 06/21/2013     Objective: Vital Signs: LMP 09/19/2008    Physical Exam   Musculoskeletal Exam: ***  CDAI Exam: CDAI Score: -- Patient Global: --; Provider Global: -- Swollen: --; Tender: -- Joint Exam 08/08/2019   No joint exam has been documented for this visit   There is currently no information documented on the homunculus. Go to the Rheumatology activity and complete the homunculus joint exam.  Investigation: No additional findings.  Imaging: No results found.  Recent Labs: Lab Results  Component Value Date   WBC 6.5 08/01/2019   HGB 13.1 08/01/2019   PLT 260 08/01/2019   NA 145 08/01/2019   K 4.5 08/01/2019   CL 108 08/01/2019   CO2 29 08/01/2019   GLUCOSE 98 08/01/2019   BUN 21 08/01/2019   CREATININE 0.93 08/01/2019   BILITOT 0.4 08/01/2019   ALKPHOS 61 12/09/2018   AST 35 08/01/2019   ALT 28 08/01/2019   PROT 6.6 08/01/2019   ALBUMIN 3.5 12/09/2018   CALCIUM 9.8 08/01/2019   GFRAA 76 08/01/2019   QFTBGOLD NEGATIVE 04/06/2017   QFTBGOLDPLUS NEGATIVE 03/27/2018    Speciality Comments: Recieves Enbrel Sureclick through [employer].  Procedures:  No procedures performed Allergies: Macrobid [nitrofurantoin macrocrystal], Ciprofloxacin, and Penicillins   Assessment / Plan:     Visit Diagnoses: No diagnosis found.  Orders: No orders of the defined types were placed in this encounter.  No orders of the defined types were placed in this encounter.   Face-to-face time spent with patient was *** minutes. Greater than 50% of time was spent in counseling and coordination of care.  Follow-Up Instructions: No follow-ups on  file.   Earnestine Mealing, CMA  Note - This record has been created using Editor, commissioning.  Chart creation errors have been sought, but may not always  have been located. Such creation errors do not reflect on  the standard of medical care.

## 2019-08-03 LAB — COMPLETE METABOLIC PANEL WITH GFR
AG Ratio: 2.1 (calc) (ref 1.0–2.5)
ALT: 28 U/L (ref 6–29)
AST: 35 U/L (ref 10–35)
Albumin: 4.5 g/dL (ref 3.6–5.1)
Alkaline phosphatase (APISO): 79 U/L (ref 37–153)
BUN: 21 mg/dL (ref 7–25)
CO2: 29 mmol/L (ref 20–32)
Calcium: 9.8 mg/dL (ref 8.6–10.4)
Chloride: 108 mmol/L (ref 98–110)
Creat: 0.93 mg/dL (ref 0.50–0.99)
GFR, Est African American: 76 mL/min/{1.73_m2} (ref >=60)
GFR, Est Non African American: 66 mL/min/{1.73_m2} (ref >=60)
Globulin: 2.1 g/dL (calc) (ref 1.9–3.7)
Glucose, Bld: 98 mg/dL (ref 65–99)
Potassium: 4.5 mmol/L (ref 3.5–5.3)
Sodium: 145 mmol/L (ref 135–146)
Total Bilirubin: 0.4 mg/dL (ref 0.2–1.2)
Total Protein: 6.6 g/dL (ref 6.1–8.1)

## 2019-08-03 LAB — CBC WITH DIFFERENTIAL/PLATELET
Absolute Monocytes: 410 cells/uL (ref 200–950)
Basophils Absolute: 72 cells/uL (ref 0–200)
Basophils Relative: 1.1 %
Eosinophils Absolute: 319 cells/uL (ref 15–500)
Eosinophils Relative: 4.9 %
HCT: 39.2 % (ref 35.0–45.0)
Hemoglobin: 13.1 g/dL (ref 11.7–15.5)
Lymphs Abs: 1489 cells/uL (ref 850–3900)
MCH: 31.1 pg (ref 27.0–33.0)
MCHC: 33.4 g/dL (ref 32.0–36.0)
MCV: 93.1 fL (ref 80.0–100.0)
MPV: 13.2 fL — ABNORMAL HIGH (ref 7.5–12.5)
Monocytes Relative: 6.3 %
Neutro Abs: 4212 cells/uL (ref 1500–7800)
Neutrophils Relative %: 64.8 %
Platelets: 260 10*3/uL (ref 140–400)
RBC: 4.21 10*6/uL (ref 3.80–5.10)
RDW: 13.4 % (ref 11.0–15.0)
Total Lymphocyte: 22.9 %
WBC: 6.5 10*3/uL (ref 3.8–10.8)

## 2019-08-03 LAB — QUANTIFERON-TB GOLD PLUS
Mitogen-NIL: >10 IU/mL
NIL: 0.03 IU/mL
QuantiFERON-TB Gold Plus: NEGATIVE
TB1-NIL: 0.03 IU/mL
TB2-NIL: 0.04 IU/mL

## 2019-08-06 MED ORDER — ENBREL SURECLICK 50 MG/ML ~~LOC~~ SOAJ: 50.0000 mg | SUBCUTANEOUS | 0 refills | Status: DC

## 2019-08-06 NOTE — Telephone Encounter (Signed)
TB Gold is negative.

## 2019-08-08 ENCOUNTER — Ambulatory Visit: Payer: Medicare Other | Admitting: Physician Assistant

## 2019-08-13 NOTE — Progress Notes (Deleted)
Office Visit Note  [name]             Date of Birth: Nov 29, 1955           MRN: [id number]             PCP: Reynold Bowen, MD Referring: Reynold Bowen, MD Visit Date: 08/16/2019 Occupation: @GUAROCC @  Subjective:  No chief complaint on file.   History of Present Illness: Becky [name] is a 64 y.o. female ***   Activities of Daily Living:  Patient reports morning stiffness for *** {minute/hour:19697}.   Patient {ACTIONS;DENIES/REPORTS:21021675::"Denies"} nocturnal pain.  Difficulty dressing/grooming: {ACTIONS;DENIES/REPORTS:21021675::"Denies"} Difficulty climbing stairs: {ACTIONS;DENIES/REPORTS:21021675::"Denies"} Difficulty getting out of chair: {ACTIONS;DENIES/REPORTS:21021675::"Denies"} Difficulty using hands for taps, buttons, cutlery, and/or writing: {ACTIONS;DENIES/REPORTS:21021675::"Denies"}  No Rheumatology ROS completed.   PMFS History:  Patient Active Problem List   Diagnosis Date Noted  . Complicated UTI (urinary tract infection) 12/13/2018  . Failure of outpatient treatment 12/11/2018  . Acute metabolic encephalopathy 81/82/9937  . Fever 12/10/2018  . Breast cancer screening, high risk patient 05/09/2018  . Eosinophilia 01/31/2017  . Iritis 01/04/2017  . History of joint swelling 08/24/2016  . Osteoarthritis of knees, bilateral 08/24/2016  . History of migraine 08/24/2016  . History of depression 08/24/2016  . History of osteoporosis 08/24/2016  . DDD (degenerative disc disease), lumbar 08/24/2016  . High risk medication use 08/24/2016  . Trigger finger, left middle finger 08/24/2016  . Trigger finger, right ring finger 08/24/2016  . 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 Increased risk of breast cancer 07/06/2015  . IDDM (insulin dependent diabetes mellitus) 07/06/2015  . 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 without organ or systems involvement ([place]) 07/06/2015  . Sjogren's syndrome ([place]) 07/06/2015  .  Genetic testing 05/12/2015  . Family history of breast cancer 04/15/2015  . Fibrocystic breast disease 10/11/2012  . GERD 10/01/2009  . Chest pain with moderate risk for cardiac etiology 09/02/2009  . DYSPHAGIA 09/02/2009  . ANXIETY 08/15/2009  . ULCER-GASTRIC 08/15/2009  . CONSTIPATION 08/15/2009  . SLOW TRANSIT CONSTIPATION 08/15/2009  . FLATULENCE ERUCTATION AND GAS PAIN 08/15/2009    Past Medical History:  Diagnosis Date  . Abnormal ECG   . Anemia   . Anxiety   . Cataract    . Cateracts bil eyes removed  . GERD (gastroesophageal reflux disease)   . Heart murmur   . Hypercholesterolemia   . IDDM (insulin dependent diabetes mellitus) ([place]) 1969  . Migraine headache   . Neuromuscular disorder (HCC)    bil legs & feet - neuropathy  . Neuropathy   . Osteoporosis    10/2012  . Otosclerosis   . RA (rheumatoid arthritis) ([place]) 2000  . S/P cardiac cath August 2013   Normal coronaries and normal LV function.  . Sjoegren syndrome     Family History  Problem Relation Age of Onset  . Breast cancer Mother 71  . Colon polyps Mother        unspecified number  . Uterine cancer Mother        metastatic  . Breast cancer Maternal Grandmother 73  . Stroke Father 77  . Rheum arthritis Sister   . Breast cancer Sister 48       lump; (-)GT (breast/ova ca panel, GeneDx)  . Skin cancer Maternal Grandfather        unspecified type  . Prostate cancer Maternal Grandfather        dx. late 77s  . Breast cancer Other  dx. in their late 71s  . Breast cancer Other        dx. 78-79  . Breast cancer Cousin        bilateral; dx. late 26s  . Colon cancer Neg Hx   . Esophageal cancer Neg Hx   . Rectal cancer Neg Hx   . Stomach cancer Neg Hx    Past Surgical History:  Procedure Laterality Date  . BREAST LUMPECTOMY  1995  . CARPAL TUNNEL RELEASE  2000  . CATARACT EXTRACTION Right 09-05-12  . CATARACT EXTRACTION Left 3/15  . CESAREAN SECTION  1987  . COLONOSCOPY    . EYE SURGERY       diabetic retinopathy   . HYSTEROSCOPY  02/2001   and D & C  . LYMPH NODE DISSECTION  2002  . TOE SURGERY  2000   bone removed   Social History   Social History Narrative  . Not on file   Immunization History  Administered Date(s) Administered  . PFIZER SARS-COV-2 Vaccination 07/11/2019, 08/01/2019  . Tdap 06/21/2013     Objective: Vital Signs: LMP 09/19/2008    Physical Exam   Musculoskeletal Exam: ***  CDAI Exam: CDAI Score: -- Patient Global: --; Provider Global: -- Swollen: --; Tender: -- Joint Exam 08/16/2019   No joint exam has been documented for this visit   There is currently no information documented on the homunculus. Go to the Rheumatology activity and complete the homunculus joint exam.  Investigation: No additional findings.  Imaging: No results found.  Recent Labs: Lab Results  Component Value Date   WBC 6.5 08/01/2019   HGB 13.1 08/01/2019   PLT 260 08/01/2019   NA 145 08/01/2019   K 4.5 08/01/2019   CL 108 08/01/2019   CO2 29 08/01/2019   GLUCOSE 98 08/01/2019   BUN 21 08/01/2019   CREATININE 0.93 08/01/2019   BILITOT 0.4 08/01/2019   ALKPHOS 61 12/09/2018   AST 35 08/01/2019   ALT 28 08/01/2019   PROT 6.6 08/01/2019   ALBUMIN 3.5 12/09/2018   CALCIUM 9.8 08/01/2019   GFRAA 76 08/01/2019   QFTBGOLD NEGATIVE 04/06/2017   QFTBGOLDPLUS NEGATIVE 08/01/2019    Speciality Comments: Recieves Enbrel Sureclick through [employer].  Procedures:  No procedures performed Allergies: Macrobid [nitrofurantoin macrocrystal], Ciprofloxacin, and Penicillins   Assessment / Plan:     Visit Diagnoses: No diagnosis found.  Orders: No orders of the defined types were placed in this encounter.  No orders of the defined types were placed in this encounter.   Face-to-face time spent with patient was *** minutes. Greater than 50% of time was spent in counseling and coordination of care.  Follow-Up Instructions: No follow-ups on  file.   Ellen Henri, CMA  Note - This record has been created using Animal nutritionist.  Chart creation errors have been sought, but may not always  have been located. Such creation errors do not reflect on  the standard of medical care.

## 2019-08-16 ENCOUNTER — Ambulatory Visit: Payer: Medicare Other | Admitting: Physician Assistant

## 2019-08-22 DIAGNOSIS — E871 Hypo-osmolality and hyponatremia: Secondary | ICD-10-CM | POA: Diagnosis not present

## 2019-08-22 DIAGNOSIS — H3520 Other non-diabetic proliferative retinopathy, unspecified eye: Secondary | ICD-10-CM | POA: Diagnosis not present

## 2019-08-22 DIAGNOSIS — N1831 Chronic kidney disease, stage 3a: Secondary | ICD-10-CM | POA: Diagnosis not present

## 2019-08-22 DIAGNOSIS — E1042 Type 1 diabetes mellitus with diabetic polyneuropathy: Secondary | ICD-10-CM | POA: Diagnosis not present

## 2019-08-27 ENCOUNTER — Other Ambulatory Visit: Payer: Self-pay | Admitting: *Deleted

## 2019-08-27 MED ORDER — METHOTREXATE SODIUM CHEMO INJECTION 50 MG/2ML: 10.0000 mg | INTRAMUSCULAR | 0 refills | Status: DC

## 2019-08-27 NOTE — Telephone Encounter (Signed)
Refill request received via fax  Last Visit:01/22/19 Next Visit: 08/08/19 Labs: 08/01/19 Stable

## 2019-08-31 ENCOUNTER — Other Ambulatory Visit: Payer: Self-pay | Admitting: Oncology

## 2019-09-05 NOTE — Progress Notes (Signed)
Office Visit Note  [name]             Date of Birth: [birth year]           MRN: [id number]             PCP: Adrian Prince, MD Referring: Adrian Prince, MD Visit Date: 09/13/2019 Occupation: @GUAROCC @  Subjective:  Low back pain   History of Present Illness: Becky [name] is a 64 y.o. female with history of seropositive rheumatoid arthritis, iritis, and DDD.  Patient is on Enbrel 50 mg sq injections once weekly, methotrexate 0.4 mL once weekly, folic acid 2 mg by mouth daily.  She denies missing any doses recently.  She denies any recent rheumatoid arthritis flares or iritis flares recently.  She continues to have chronic eye dryness but denies any eye pain or redness at this time.  She states that her mother passed away about 2 weeks ago she has been under increased stress which she feels is contributing to her increased arthralgias.  She also has noticed a left trigger thumb and would like to schedule cortisone injection.  She has chronic lower back pain.  She states that the pain is constant.  She denies any symptoms of radiculopathy.  She would like to see a back specialist if possible.  She says she is also been having increased discomfort in both knee joints.  She denies any joint swelling.  She states that the discomfort is aggravated by climbing steps.   Activities of Daily Living:  Patient reports morning stiffness for 5-10 minutes.   Patient Denies nocturnal pain.  Difficulty dressing/grooming: Denies Difficulty climbing stairs: Reports Difficulty getting out of chair: Denies Difficulty using hands for taps, buttons, cutlery, and/or writing: Denies  Review of Systems  Constitutional: Positive for fatigue.  HENT: Positive for mouth dryness. Negative for mouth sores and nose dryness.   Eyes: Positive for dryness. Negative for pain and visual disturbance.  Respiratory: Positive for cough. Negative for hemoptysis, shortness of breath and difficulty  breathing.   Cardiovascular: Negative for chest pain, palpitations, hypertension and swelling in legs/feet.  Gastrointestinal: Positive for constipation. Negative for blood in stool and diarrhea.  Endocrine: Negative for increased urination.  Genitourinary: Negative for painful urination.  Musculoskeletal: Positive for arthralgias, joint pain and morning stiffness. Negative for joint swelling, myalgias, muscle weakness, muscle tenderness and myalgias.  Skin: Negative for color change, pallor, rash, hair loss, nodules/bumps, skin tightness, ulcers and sensitivity to sunlight.  Allergic/Immunologic: Negative for susceptible to infections.  Neurological: Negative for dizziness, numbness, headaches and weakness.  Hematological: Negative for swollen glands.  Psychiatric/Behavioral: Negative for depressed mood and sleep disturbance. The patient is not nervous/anxious.     PMFS History:  Patient Active Problem List   Diagnosis Date Noted  . Complicated UTI (urinary tract infection) 12/13/2018  . Failure of outpatient treatment 12/11/2018  . Acute metabolic encephalopathy 12/11/2018  . Fever 12/10/2018  . Breast cancer screening, high risk patient 05/09/2018  . Eosinophilia 01/31/2017  . Iritis 01/04/2017  . History of joint swelling 08/24/2016  . Osteoarthritis of knees, bilateral 08/24/2016  . History of migraine 08/24/2016  . History of depression 08/24/2016  . History of osteoporosis 08/24/2016  . DDD (degenerative disc disease), lumbar 08/24/2016  . High risk medication use 08/24/2016  . Trigger finger, left middle finger 08/24/2016  . Trigger finger, right ring finger 08/24/2016  . 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 Increased risk of breast cancer 07/06/2015  . IDDM (  insulin dependent diabetes mellitus) 07/06/2015  . 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 without organ or systems involvement ([place]) 07/06/2015  . Sjogren's syndrome ([place]) 07/06/2015  . Genetic  testing 05/12/2015  . Family history of breast cancer 04/15/2015  . Fibrocystic breast disease 10/11/2012  . GERD 10/01/2009  . Chest pain with moderate risk for cardiac etiology 09/02/2009  . DYSPHAGIA 09/02/2009  . ANXIETY 08/15/2009  . ULCER-GASTRIC 08/15/2009  . CONSTIPATION 08/15/2009  . SLOW TRANSIT CONSTIPATION 08/15/2009  . FLATULENCE ERUCTATION AND GAS PAIN 08/15/2009    Past Medical History:  Diagnosis Date  . Abnormal ECG   . Anemia   . Anxiety   . Cataract    . Cateracts bil eyes removed  . GERD (gastroesophageal reflux disease)   . Heart murmur   . Hypercholesterolemia   . IDDM (insulin dependent diabetes mellitus) 1969  . Migraine headache   . Neuromuscular disorder (HCC)    bil legs & feet - neuropathy  . Neuropathy   . Osteoporosis    10/2012  . Otosclerosis   . RA (rheumatoid arthritis) ([place]) 2000  . S/P cardiac cath August 2013   Normal coronaries and normal LV function.  . Sjoegren syndrome     Family History  Problem Relation Age of Onset  . Breast cancer Mother 40  . Colon polyps Mother        unspecified number  . Uterine cancer Mother        metastatic  . Breast cancer Maternal Grandmother 72  . Stroke Father 74  . Rheum arthritis Sister   . Breast cancer Sister 48       lump; (-)GT (breast/ova ca panel, GeneDx)  . Skin cancer Maternal Grandfather        unspecified type  . Prostate cancer Maternal Grandfather        dx. late 59s  . Breast cancer Other        dx. in their late 1s  . Breast cancer Other        dx. 78-79  . Breast cancer Cousin        bilateral; dx. late 40s  . Colon cancer Neg Hx   . Esophageal cancer Neg Hx   . Rectal cancer Neg Hx   . Stomach cancer Neg Hx    Past Surgical History:  Procedure Laterality Date  . BREAST LUMPECTOMY  1995  . CARPAL TUNNEL RELEASE  2000  . CATARACT EXTRACTION Right 09-05-12  . CATARACT EXTRACTION Left 3/15  . CESAREAN SECTION  1987  . COLONOSCOPY    . EYE SURGERY     diabetic  retinopathy   . HYSTEROSCOPY  02/2001   and D & C  . LYMPH NODE DISSECTION  2002  . TOE SURGERY  2000   bone removed   Social History   Social History Narrative  . Not on file   Immunization History  Administered Date(s) Administered  . PFIZER SARS-COV-2 Vaccination 07/11/2019, 08/01/2019  . Tdap 06/21/2013     Objective: Vital Signs: BP 105/67 (BP Location: Left Arm, Patient Position: Sitting, Cuff Size: Small)   Pulse 83   Resp 12   Ht 5\' 4"  (1.626 m)   Wt 127 lb 9.6 oz (57.9 kg)   LMP 09/19/2008   BMI 21.90 kg/m    Physical Exam Vitals and nursing note reviewed.  Constitutional:      Appearance: She is well-developed.  HENT:     Head: Normocephalic and atraumatic.  Eyes:  Conjunctiva/sclera: Conjunctivae normal.  Pulmonary:     Effort: Pulmonary effort is normal.  Abdominal:     General: Bowel sounds are normal.     Palpations: Abdomen is soft.  Musculoskeletal:     Cervical back: Normal range of motion.  Lymphadenopathy:     Cervical: No cervical adenopathy.  Skin:    General: Skin is warm and dry.     Capillary Refill: Capillary refill takes less than 2 seconds.  Neurological:     Mental Status: She is alert and oriented to person, place, and time.  Psychiatric:        Behavior: Behavior normal.      Musculoskeletal Exam: C-spine good ROM.  Limited and painful ROM of lumbar spine.  shoulder joints, elbow joints, wrist joints ,MCPs, PIPs ,and DIPs good ROM with no synovitis.  PIP and DIP thickening consistent with OA of both hands. Left trigger thumb.  Synovial thickening and tenderness of the left 1st MCP joint.  Hip joints, knee joints, ankle joints, MTPs, PIPs, and DIPs good ROM with no synovitis.  No warmth or effusion of knee joints.  No tenderness or swelling of ankle joints.   CDAI Exam: CDAI Score: -- Patient Global: --; Provider Global: -- Swollen: --; Tender: -- Joint Exam 09/13/2019   No joint exam has been documented for this visit    There is currently no information documented on the homunculus. Go to the Rheumatology activity and complete the homunculus joint exam.  Investigation: No additional findings.  Imaging: No results found.  Recent Labs: Lab Results  Component Value Date   WBC 6.5 08/01/2019   HGB 13.1 08/01/2019   PLT 260 08/01/2019   NA 145 08/01/2019   K 4.5 08/01/2019   CL 108 08/01/2019   CO2 29 08/01/2019   GLUCOSE 98 08/01/2019   BUN 21 08/01/2019   CREATININE 0.93 08/01/2019   BILITOT 0.4 08/01/2019   ALKPHOS 61 12/09/2018   AST 35 08/01/2019   ALT 28 08/01/2019   PROT 6.6 08/01/2019   ALBUMIN 3.5 12/09/2018   CALCIUM 9.8 08/01/2019   GFRAA 76 08/01/2019   QFTBGOLD NEGATIVE 04/06/2017   QFTBGOLDPLUS NEGATIVE 08/01/2019    Speciality Comments: Recieves Enbrel Sureclick through [employer].  Procedures:  No procedures performed Allergies: Macrobid [nitrofurantoin macrocrystal], Ciprofloxacin, and Penicillins   Assessment / Plan:     Visit Diagnoses: 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 without organ or systems involvement (HCC): She has no synovitis on exam today.  She has tenderness and synovial thickening of the left first MCP joint.  She has complete fist formation bilaterally.  She has been experiencing increased arthralgias which she attributes to being under increased stress since her mother passed away 2 weeks ago.  Overall she is clinically been doing well on Enbrel 50 mg subcu injections once weekly, methotrexate 0.4 mL once weekly, and folic acid 2 mg by mouth daily.  She has not missed any doses recently.  She will continue on the current treatment regimen.  She was advised to notify us if she develops increased joint pain or joint swelling.  She will follow up in 5 months.  High risk medication use - Enbrel SureClick 50 mg every 7 days, methotrexate 0.4 mL every 7 days, and folic acid 1 mg 2 tablets daily.  CBC and CMP within normal  limits on 08/01/2019.  TB gold negative on 08/01/2019.  She will return for lab work in May and every 3 months.  Standing orders are in place.  Iritis: She has not had any recent iritis flares.  She experiences eye dryness but has not had any other signs or symptoms of a flare.  She sees her ophthalmologist on a regular basis.  Sjogren's syndrome with keratoconjunctivitis sicca (HCC): She continues to have chronic sicca symptoms.  She uses OTC products for symptomatic relief.   Trigger thumb, left thumb: She has been experiencing tenderness and locking for the past 1 month.  An ultrasound guided cortisone injection will be performed today.    Primary osteoarthritis of both knees: She has been experiencing increased pain in both knee joints.  Her discomfort is exacerbated by climbing steps.  She has no difficulty or discomfort walking.  She has good range of motion of both knee joints on exam.  No warmth or effusion was noted.  She was given a handout of knee joint exercises to perform.  She declined x-rays of her knees today.  DDD (degenerative disc disease), lumbar - She had a MRI in 2016 that revealed severe DDD and foraminal stenosis at multiple levels.  She will be referred to Dr. Otelia Sergeant.   Chronic midline low back pain without sciatica: She has chronic and constant lower back pain.  She would like a referral to Dr. Otelia Sergeant for further evaluation.   IMPRESSION: 1. Severe lumbar disc and endplate degeneration at L1-L2, far out of proportion to disc and endplate changes elsewhere in the visible spine. Still, this has a benign appearance. Multifactorial mild to moderate right lateral recess and right L1 foraminal stenosis result. 2. Chronic L5 pars fractures and grade 1 L5-S1 spondylolisthesis. Associated disc and endplate degeneration, but only borderline to mild L5 foraminal stenosis. 3. Small foraminal disc protrusion at L3-L4 with mild left L3 foraminal stenosis.  History of osteoporosis -  Prolia 60 mg every 6 months.   Other fatigue: Chronic   Other medical conditions are listed as follows:   History of depression  History of gastroesophageal reflux (GERD)  History of diabetes mellitus  History of migraine  Orders: No orders of the defined types were placed in this encounter.  No orders of the defined types were placed in this encounter.   Face-to-face time spent with patient was 30 minutes. Greater than 50% of time was spent in counseling and coordination of care.  Follow-Up Instructions: Return in about 5 months (around 02/13/2020) for Rheumatoid arthritis, DDD.   Gearldine Bienenstock, PA-C  Note - This record has been created using Dragon software.  Chart creation errors have been sought, but may not always  have been located. Such creation errors do not reflect on  the standard of medical care.

## 2019-09-11 DIAGNOSIS — Z0120 Encounter for dental examination and cleaning without abnormal findings: Secondary | ICD-10-CM | POA: Diagnosis not present

## 2019-09-12 DIAGNOSIS — L570 Actinic keratosis: Secondary | ICD-10-CM | POA: Diagnosis not present

## 2019-09-12 DIAGNOSIS — L578 Other skin changes due to chronic exposure to nonionizing radiation: Secondary | ICD-10-CM | POA: Diagnosis not present

## 2019-09-12 DIAGNOSIS — L821 Other seborrheic keratosis: Secondary | ICD-10-CM | POA: Diagnosis not present

## 2019-09-12 DIAGNOSIS — L814 Other melanin hyperpigmentation: Secondary | ICD-10-CM | POA: Diagnosis not present

## 2019-09-13 ENCOUNTER — Ambulatory Visit: Payer: Medicare Other | Admitting: Physician Assistant

## 2019-09-13 ENCOUNTER — Encounter: Payer: Self-pay | Admitting: Physician Assistant

## 2019-09-13 ENCOUNTER — Other Ambulatory Visit: Payer: Self-pay

## 2019-09-13 VITALS — BP 105/67 | HR 83 | Resp 12 | Ht 64.0 in | Wt 127.6 lb

## 2019-09-13 DIAGNOSIS — M51369 Other intervertebral disc degeneration, lumbar region without mention of lumbar back pain or lower extremity pain: Secondary | ICD-10-CM

## 2019-09-13 DIAGNOSIS — Z8639 Personal history of other endocrine, nutritional and metabolic disease: Secondary | ICD-10-CM

## 2019-09-13 DIAGNOSIS — Z8739 Personal history of other diseases of the musculoskeletal system and connective tissue: Secondary | ICD-10-CM

## 2019-09-13 DIAGNOSIS — Z8669 Personal history of other diseases of the nervous system and sense organs: Secondary | ICD-10-CM

## 2019-09-13 DIAGNOSIS — M545 Low back pain, unspecified: Secondary | ICD-10-CM

## 2019-09-13 DIAGNOSIS — Z8659 Personal history of other mental and behavioral disorders: Secondary | ICD-10-CM

## 2019-09-13 DIAGNOSIS — R5383 Other fatigue: Secondary | ICD-10-CM

## 2019-09-13 DIAGNOSIS — M3501 Sicca syndrome with keratoconjunctivitis: Secondary | ICD-10-CM

## 2019-09-13 DIAGNOSIS — M0579 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 without organ or systems involvement: Secondary | ICD-10-CM

## 2019-09-13 DIAGNOSIS — H209 Unspecified iridocyclitis: Secondary | ICD-10-CM | POA: Diagnosis not present

## 2019-09-13 DIAGNOSIS — Z79899 Other long term (current) drug therapy: Secondary | ICD-10-CM | POA: Diagnosis not present

## 2019-09-13 DIAGNOSIS — M170 Bilateral primary osteoarthritis of knee: Secondary | ICD-10-CM

## 2019-09-13 DIAGNOSIS — Z8719 Personal history of other diseases of the digestive system: Secondary | ICD-10-CM

## 2019-09-13 DIAGNOSIS — G8929 Other chronic pain: Secondary | ICD-10-CM

## 2019-09-13 DIAGNOSIS — M65312 Trigger thumb, left thumb: Secondary | ICD-10-CM

## 2019-09-13 DIAGNOSIS — M5136 Other intervertebral disc degeneration, lumbar region: Secondary | ICD-10-CM

## 2019-09-13 NOTE — Patient Instructions (Addendum)
Standing Labs We placed an order today for your standing lab work.    Please come back and get your standing labs in May and every 3 months   We have open lab daily Monday through Thursday from 8:30-12:30 PM and 1:30-4:30 PM and Friday from 8:30-12:30 PM and 1:30-4:00 PM at the office of Dr. Shaili Deveshwar.   You may experience shorter wait times on Monday and Friday afternoons. The office is located at [address], [address], [place], [place] [address] No appointment is necessary.   Labs are drawn by [facility].  You may receive a bill from [facility] for your lab work.  If you wish to have your labs drawn at another location, please call the office 24 hours in advance to send orders.  If you have any questions regarding directions or hours of operation,  please call [phone].   Just as a reminder please drink plenty of water prior to coming for your lab work. Thanks!    Journal for Nurse Practitioners, 15(4), 263-267. Retrieved March 27, 2018 from http://clinicalkey.com/nursing">  Knee Exercises Ask your health care provider which exercises are safe for you. Do exercises exactly as told by your health care provider and adjust them as directed. It is normal to feel mild stretching, pulling, tightness, or discomfort as you do these exercises. Stop right away if you feel sudden pain or your pain gets worse. Do not begin these exercises until told by your health care provider. Stretching and range-of-motion exercises These exercises warm up your muscles and joints and improve the movement and flexibility of your knee. These exercises also help to relieve pain and swelling. Knee extension, prone 1. Lie on your abdomen (prone position) on a bed. 2. Place your left / right knee just beyond the edge of the surface so your knee is not on the bed. You can put a towel under your left / right thigh just above your kneecap for comfort. 3. Relax your leg muscles and allow gravity to straighten  your knee (extension). You should feel a stretch behind your left / right knee. 4. Hold this position for __________ seconds. 5. Scoot up so your knee is supported between repetitions. Repeat __________ times. Complete this exercise __________ times a day. Knee flexion, active  1. Lie on your back with both legs straight. If this causes back discomfort, bend your left / right knee so your foot is flat on the floor. 2. Slowly slide your left / right heel back toward your buttocks. Stop when you feel a gentle stretch in the front of your knee or thigh (flexion). 3. Hold this position for __________ seconds. 4. Slowly slide your left / right heel back to the starting position. Repeat __________ times. Complete this exercise __________ times a day. Quadriceps stretch, prone  1. Lie on your abdomen on a firm surface, such as a bed or padded floor. 2. Bend your left / right knee and hold your ankle. If you cannot reach your ankle or pant leg, loop a belt around your foot and grab the belt instead. 3. Gently pull your heel toward your buttocks. Your knee should not slide out to the side. You should feel a stretch in the front of your thigh and knee (quadriceps). 4. Hold this position for __________ seconds. Repeat __________ times. Complete this exercise __________ times a day. Hamstring, supine 1. Lie on your back (supine position). 2. Loop a belt or towel over the ball of your left / right foot. The   ball of your foot is on the walking surface, right under your toes. 3. Straighten your left / right knee and slowly pull on the belt to raise your leg until you feel a gentle stretch behind your knee (hamstring). ? Do not let your knee bend while you do this. ? Keep your other leg flat on the floor. 4. Hold this position for __________ seconds. Repeat __________ times. Complete this exercise __________ times a day. Strengthening exercises These exercises build strength and endurance in your knee.  Endurance is the ability to use your muscles for a long time, even after they get tired. Quadriceps, isometric This exercise stretches the muscles in front of your thigh (quadriceps) without moving your knee joint (isometric). 1. Lie on your back with your left / right leg extended and your other knee bent. Put a rolled towel or small pillow under your knee if told by your health care provider. 2. Slowly tense the muscles in the front of your left / right thigh. You should see your kneecap slide up toward your hip or see increased dimpling just above the knee. This motion will push the back of the knee toward the floor. 3. For __________ seconds, hold the muscle as tight as you can without increasing your pain. 4. Relax the muscles slowly and completely. Repeat __________ times. Complete this exercise __________ times a day. Straight leg raises This exercise stretches the muscles in front of your thigh (quadriceps) and the muscles that move your hips (hip flexors). 1. Lie on your back with your left / right leg extended and your other knee bent. 2. Tense the muscles in the front of your left / right thigh. You should see your kneecap slide up or see increased dimpling just above the knee. Your thigh may even shake a bit. 3. Keep these muscles tight as you raise your leg 4-6 inches (10-15 cm) off the floor. Do not let your knee bend. 4. Hold this position for __________ seconds. 5. Keep these muscles tense as you lower your leg. 6. Relax your muscles slowly and completely after each repetition. Repeat __________ times. Complete this exercise __________ times a day. Hamstring, isometric 1. Lie on your back on a firm surface. 2. Bend your left / right knee about __________ degrees. 3. Dig your left / right heel into the surface as if you are trying to pull it toward your buttocks. Tighten the muscles in the back of your thighs (hamstring) to "dig" as hard as you can without increasing any  pain. 4. Hold this position for __________ seconds. 5. Release the tension gradually and allow your muscles to relax completely for __________ seconds after each repetition. Repeat __________ times. Complete this exercise __________ times a day. Hamstring curls If told by your health care provider, do this exercise while wearing ankle weights. Begin with __________ lb weights. Then increase the weight by 1 lb (0.5 kg) increments. Do not wear ankle weights that are more than __________ lb. 1. Lie on your abdomen with your legs straight. 2. Bend your left / right knee as far as you can without feeling pain. Keep your hips flat against the floor. 3. Hold this position for __________ seconds. 4. Slowly lower your leg to the starting position. Repeat __________ times. Complete this exercise __________ times a day. Squats This exercise strengthens the muscles in front of your thigh and knee (quadriceps). 1. Stand in front of a table, with your feet and knees pointing straight ahead. You may   rest your hands on the table for balance but not for support. 2. Slowly bend your knees and lower your hips like you are going to sit in a chair. ? Keep your weight over your heels, not over your toes. ? Keep your lower legs upright so they are parallel with the table legs. ? Do not let your hips go lower than your knees. ? Do not bend lower than told by your health care provider. ? If your knee pain increases, do not bend as low. 3. Hold the squat position for __________ seconds. 4. Slowly push with your legs to return to standing. Do not use your hands to pull yourself to standing. Repeat __________ times. Complete this exercise __________ times a day. Wall slides This exercise strengthens the muscles in front of your thigh and knee (quadriceps). 1. Lean your back against a smooth wall or door, and walk your feet out 18-24 inches (46-61 cm) from it. 2. Place your feet hip-width apart. 3. Slowly slide down  the wall or door until your knees bend __________ degrees. Keep your knees over your heels, not over your toes. Keep your knees in line with your hips. 4. Hold this position for __________ seconds. Repeat __________ times. Complete this exercise __________ times a day. Straight leg raises This exercise strengthens the muscles that rotate the leg at the hip and move it away from your body (hip abductors). 1. Lie on your side with your left / right leg in the top position. Lie so your head, shoulder, knee, and hip line up. You may bend your bottom knee to help you keep your balance. 2. Roll your hips slightly forward so your hips are stacked directly over each other and your left / right knee is facing forward. 3. Leading with your heel, lift your top leg 4-6 inches (10-15 cm). You should feel the muscles in your outer hip lifting. ? Do not let your foot drift forward. ? Do not let your knee roll toward the ceiling. 4. Hold this position for __________ seconds. 5. Slowly return your leg to the starting position. 6. Let your muscles relax completely after each repetition. Repeat __________ times. Complete this exercise __________ times a day. Straight leg raises This exercise stretches the muscles that move your hips away from the front of the pelvis (hip extensors). 1. Lie on your abdomen on a firm surface. You can put a pillow under your hips if that is more comfortable. 2. Tense the muscles in your buttocks and lift your left / right leg about 4-6 inches (10-15 cm). Keep your knee straight as you lift your leg. 3. Hold this position for __________ seconds. 4. Slowly lower your leg to the starting position. 5. Let your leg relax completely after each repetition. Repeat __________ times. Complete this exercise __________ times a day. This information is not intended to replace advice given to you by your health care provider. Make sure you discuss any questions you have with your health care  provider. Document Revised: 03/28/2018 Document Reviewed: 03/28/2018 Elsevier Patient Education  2020 [employer]   

## 2019-10-04 DIAGNOSIS — E7849 Other hyperlipidemia: Secondary | ICD-10-CM | POA: Diagnosis not present

## 2019-10-04 DIAGNOSIS — M859 Disorder of bone density and structure, unspecified: Secondary | ICD-10-CM | POA: Diagnosis not present

## 2019-10-04 DIAGNOSIS — E1042 Type 1 diabetes mellitus with diabetic polyneuropathy: Secondary | ICD-10-CM | POA: Diagnosis not present

## 2019-10-09 DIAGNOSIS — G43719 Chronic migraine without aura, intractable, without status migrainosus: Secondary | ICD-10-CM | POA: Diagnosis not present

## 2019-10-09 DIAGNOSIS — M791 Myalgia, unspecified site: Secondary | ICD-10-CM | POA: Diagnosis not present

## 2019-10-09 DIAGNOSIS — M542 Cervicalgia: Secondary | ICD-10-CM | POA: Diagnosis not present

## 2019-10-09 DIAGNOSIS — G518 Other disorders of facial nerve: Secondary | ICD-10-CM | POA: Diagnosis not present

## 2019-10-10 ENCOUNTER — Ambulatory Visit (INDEPENDENT_AMBULATORY_CARE_PROVIDER_SITE_OTHER): Payer: Medicare Other | Admitting: Rheumatology

## 2019-10-10 ENCOUNTER — Other Ambulatory Visit: Payer: Self-pay

## 2019-10-10 DIAGNOSIS — M65312 Trigger thumb, left thumb: Secondary | ICD-10-CM | POA: Diagnosis not present

## 2019-10-10 MED ORDER — TRIAMCINOLONE ACETONIDE 40 MG/ML IJ SUSP
10.0000 mg | INTRAMUSCULAR | Status: AC | PRN
  Administered 2019-10-10: 10 mg

## 2019-10-10 MED ORDER — LIDOCAINE HCL 1 % IJ SOLN
0.5000 mL | INTRAMUSCULAR | Status: AC | PRN
  Administered 2019-10-10: .5 mL

## 2019-10-10 NOTE — Progress Notes (Signed)
   Procedure Note  [name]             Date of Birth: 10/04/1955           MRN: [id number]             Visit Date: 10/10/2019  Procedures: Visit Diagnoses:  1. Trigger thumb, left thumb     Hand/UE Inj: L thumb A1 for trigger finger on 10/10/2019 4:14 PM Indications: pain, tendon swelling and therapeutic Details: 27 G needle, ultrasound-guided volar approach Medications: 0.5 mL lidocaine 1 %; 10 mg triamcinolone acetonide 40 MG/ML Aspirate: 0 mL Procedure, treatment alternatives, risks and benefits explained, specific risks discussed. Immediately prior to procedure a time out was called to verify the correct patient, procedure, equipment, support staff and site/side marked as required. Patient was prepped and draped in the usual sterile fashion.    Patient tolerated the procedure well.  Postprocedure instructions were given.  Thumb splint was applied.  Pollyann Savoy, MD

## 2019-10-11 ENCOUNTER — Ambulatory Visit: Payer: Medicare Other | Admitting: Specialist

## 2019-10-11 ENCOUNTER — Encounter: Payer: Self-pay | Admitting: Specialist

## 2019-10-11 ENCOUNTER — Other Ambulatory Visit: Payer: Self-pay

## 2019-10-11 ENCOUNTER — Ambulatory Visit: Payer: Self-pay

## 2019-10-11 VITALS — BP 146/64 | HR 82 | Ht 64.0 in | Wt 128.0 lb

## 2019-10-11 DIAGNOSIS — M47816 Spondylosis without myelopathy or radiculopathy, lumbar region: Secondary | ICD-10-CM | POA: Diagnosis not present

## 2019-10-11 DIAGNOSIS — M4316 Spondylolisthesis, lumbar region: Secondary | ICD-10-CM | POA: Diagnosis not present

## 2019-10-11 DIAGNOSIS — M5136 Other intervertebral disc degeneration, lumbar region: Secondary | ICD-10-CM

## 2019-10-11 DIAGNOSIS — M4156 Other secondary scoliosis, lumbar region: Secondary | ICD-10-CM

## 2019-10-11 MED ORDER — GABAPENTIN 100 MG PO CAPS: 100.0000 mg | ORAL_CAPSULE | Freq: Every day | ORAL | 3 refills | Status: DC

## 2019-10-11 NOTE — Patient Instructions (Signed)
Avoid bending, stooping and avoid lifting weights greater than 10 lbs. Avoid prolong standing and walking. Avoid frequent bending and stooping  No lifting greater than 10 lbs. May use ice or moist heat for pain. Weight loss is of benefit. Handicap license is approved. Dr. Arivaca Blas secretary/Assistant will call to arrange for lumbar facet injection  Hemp CBD capsules, amazon.com 5,000-7,000 mg per bottle, 60 capsules per bottle, take one capsule twice a day.  Follow-Up Instructions: No follow-ups on file.

## 2019-10-11 NOTE — Progress Notes (Signed)
Office Visit Note   [name]           Date of Birth: 18-Jun-1956           MRN: [id number] Visit Date: 10/11/2019              Requested by: Gearldine Bienenstock, PA-C [address] [place],  [place] [address] PCP: Adrian Prince, MD   Assessment & Plan: Visit Diagnoses:  1. DDD (degenerative disc disease), lumbar     Plan: Avoid bending, stooping and avoid lifting weights greater than 10 lbs. Avoid prolong standing and walking. Avoid frequent bending and stooping  No lifting greater than 10 lbs. May use ice or moist heat for pain. Weight loss is of benefit. Handicap license is approved. Dr. Kearny Blas secretary/Assistant will call to arrange for lumbar facet injection  Hemp CBD capsules, [email or web address] 5,000-7,000 mg per bottle, 60 capsules per bottle, take one capsule twice a day.  Follow-Up Instructions: Return in about 4 weeks (around 11/08/2019).   Orders:  Orders Placed This Encounter  Procedures  . XR Lumbar Spine 2-3 Views   No orders of the defined types were placed in this encounter.     Procedures: No procedures performed   Clinical Data: No additional findings.   Subjective: Chief Complaint  Patient presents with  . Lower Back - Pain    Referred By Dr. Corliss Skains----    64 year old female with history of back pain that is present at all times,  Worse in the AM when she first gets up. Some days are worse than Others, she has pain with sitting and bending and stooping and in the past she has been seen by other MD for cervical spondylosis and was given tramadol. She did not do well with tramadol due to side effects. She was seen one time and was given tramadol. Alleve was not doen't help either. She was advised to try the alleve but it was of benefit. There is no leg pain numbness or weakness. There is constipation But this may be due to diabetes. She has had evaluation of the legs and has severe diabetic neuropathy in the arms, legs. She  has had diabetes for nearly 51 years and uses an insulin pump. Her pain is in the right mid lumbar spine.    Review of Systems   Objective: Vital Signs: BP (!) 146/64 (BP Location: Left Arm, Patient Position: Sitting)   Pulse 82   Ht 5\' 4"  (1.626 m)   Wt 128 lb (58.1 kg)   LMP 09/19/2008   BMI 21.97 kg/m   Physical Exam  Ortho Exam  Specialty Comments:  No specialty comments available.  Imaging: No results found.   PMFS History: Patient Active Problem List   Diagnosis Date Noted  . Complicated UTI (urinary tract infection) 12/13/2018  . Failure of outpatient treatment 12/11/2018  . Acute metabolic encephalopathy 12/11/2018  . Fever 12/10/2018  . Breast cancer screening, high risk patient 05/09/2018  . Eosinophilia 01/31/2017  . Iritis 01/04/2017  . History of joint swelling 08/24/2016  . Osteoarthritis of knees, bilateral 08/24/2016  . History of migraine 08/24/2016  . History of depression 08/24/2016  . History of osteoporosis 08/24/2016  . DDD (degenerative disc disease), lumbar 08/24/2016  . High risk medication use 08/24/2016  . Trigger finger, left middle finger 08/24/2016  . Trigger finger, right ring finger 08/24/2016  . 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 Increased risk of breast cancer 07/06/2015  . IDDM (  insulin dependent diabetes mellitus) 07/06/2015  . 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 without organ or systems involvement ([place]) 07/06/2015  . Sjogren's syndrome ([place]) 07/06/2015  . Genetic testing 05/12/2015  . Family history of breast cancer 04/15/2015  . Fibrocystic breast disease 10/11/2012  . GERD 10/01/2009  . Chest pain with moderate risk for cardiac etiology 09/02/2009  . DYSPHAGIA 09/02/2009  . ANXIETY 08/15/2009  . ULCER-GASTRIC 08/15/2009  . CONSTIPATION 08/15/2009  . SLOW TRANSIT CONSTIPATION 08/15/2009  . FLATULENCE ERUCTATION AND GAS PAIN 08/15/2009   Past Medical History:  Diagnosis Date  . Abnormal  ECG   . Anemia   . Anxiety   . Cataract    . Cateracts bil eyes removed  . GERD (gastroesophageal reflux disease)   . Heart murmur   . Hypercholesterolemia   . IDDM (insulin dependent diabetes mellitus) 1969  . Migraine headache   . Neuromuscular disorder (HCC)    bil legs & feet - neuropathy  . Neuropathy   . Osteoporosis    10/2012  . Otosclerosis   . RA (rheumatoid arthritis) ([place]) 2000  . S/P cardiac cath August 2013   Normal coronaries and normal LV function.  . Sjoegren syndrome     Family History  Problem Relation Age of Onset  . Breast cancer Mother 58  . Colon polyps Mother        unspecified number  . Uterine cancer Mother        metastatic  . Breast cancer Maternal Grandmother 60  . Stroke Father 54  . Rheum arthritis Sister   . Breast cancer Sister 48       lump; (-)GT (breast/ova ca panel, GeneDx)  . Skin cancer Maternal Grandfather        unspecified type  . Prostate cancer Maternal Grandfather        dx. late 60s  . Breast cancer Other        dx. in their late 40s  . Breast cancer Other        dx. 78-79  . Breast cancer Cousin        bilateral; dx. late 74s  . Colon cancer Neg Hx   . Esophageal cancer Neg Hx   . Rectal cancer Neg Hx   . Stomach cancer Neg Hx     Past Surgical History:  Procedure Laterality Date  . BREAST LUMPECTOMY  1995  . CARPAL TUNNEL RELEASE  2000  . CATARACT EXTRACTION Right 09-05-12  . CATARACT EXTRACTION Left 3/15  . CESAREAN SECTION  1987  . COLONOSCOPY    . EYE SURGERY     diabetic retinopathy   . HYSTEROSCOPY  02/2001   and D & C  . LYMPH NODE DISSECTION  2002  . TOE SURGERY  2000   bone removed   Social History   Occupational History  . Occupation: disability    Employer: UNEMPLOYED  Tobacco Use  . Smoking status: Never Smoker  . Smokeless tobacco: Never Used  Substance and Sexual Activity  . Alcohol use: Yes    Alcohol/week: 4.0 standard drinks    Types: 2 Shots of liquor, 2 Standard drinks or  equivalent per week    Comment: maybe 1-2 cocktails per wk  . Drug use: No  . Sexual activity: Yes    Partners: Male    Birth control/protection: Post-menopausal    Comment: vasectomy

## 2019-10-15 DIAGNOSIS — H04123 Dry eye syndrome of bilateral lacrimal glands: Secondary | ICD-10-CM | POA: Diagnosis not present

## 2019-10-15 DIAGNOSIS — Z961 Presence of intraocular lens: Secondary | ICD-10-CM | POA: Diagnosis not present

## 2019-10-16 DIAGNOSIS — M8589 Other specified disorders of bone density and structure, multiple sites: Secondary | ICD-10-CM | POA: Diagnosis not present

## 2019-11-01 ENCOUNTER — Ambulatory Visit: Payer: Medicare Other | Admitting: Physical Medicine and Rehabilitation

## 2019-11-01 ENCOUNTER — Encounter: Payer: Self-pay | Admitting: Physical Medicine and Rehabilitation

## 2019-11-01 ENCOUNTER — Ambulatory Visit: Payer: Self-pay

## 2019-11-01 ENCOUNTER — Other Ambulatory Visit: Payer: Self-pay

## 2019-11-01 VITALS — BP 144/68 | HR 81

## 2019-11-01 DIAGNOSIS — M47816 Spondylosis without myelopathy or radiculopathy, lumbar region: Secondary | ICD-10-CM

## 2019-11-01 DIAGNOSIS — M545 Low back pain, unspecified: Secondary | ICD-10-CM

## 2019-11-01 DIAGNOSIS — G8929 Other chronic pain: Secondary | ICD-10-CM | POA: Diagnosis not present

## 2019-11-01 MED ORDER — METHYLPREDNISOLONE ACETATE 80 MG/ML IJ SUSP
20.0000 mg | Freq: Once | INTRAMUSCULAR | Status: AC
  Administered 2019-11-01: 20 mg

## 2019-11-01 MED ORDER — BUPIVACAINE HCL 0.5 % IJ SOLN
3.0000 mL | Freq: Once | INTRAMUSCULAR | Status: AC
  Administered 2019-11-01: 3 mL

## 2019-11-01 NOTE — Progress Notes (Signed)
2016 SRS, 07/2014 right L2-3 interlam followed by L4-5 and L5-S1 facets. Pt states pain on the right side of the middle back  Pt states pain started about ten years ago and has gotten worse in the past 2 years. Sleeping and bending makes pain worse. Moving around and heating pad helps with pain.   .Numeric Pain Rating Scale and Functional Assessment Average Pain 8   In the last MONTH (on 0-10 scale) has pain interfered with the following?  1. General activity like being  able to carry out your everyday physical activities such as walking, climbing stairs, carrying groceries, or moving a chair?  Rating(9)   +Driver, -BT, -Dye Allergies.

## 2019-11-02 DIAGNOSIS — E109 Type 1 diabetes mellitus without complications: Secondary | ICD-10-CM | POA: Diagnosis not present

## 2019-11-02 DIAGNOSIS — Z794 Long term (current) use of insulin: Secondary | ICD-10-CM | POA: Diagnosis not present

## 2019-11-02 NOTE — Procedures (Signed)
Lumbar Diagnostic Facet Joint Nerve Block with Fluoroscopic Guidance   [name]      Date of Birth: 14-Oct-1955 MRN: [id number] PCP: Adrian Prince, MD      Visit Date: 11/01/2019   Universal Protocol:    Date/Time: 05/14/216:02 AM  Consent Given By: the patient  Position: PRONE  Additional Comments: Vital signs were monitored before and after the procedure. Patient was prepped and draped in the usual sterile fashion. The correct patient, procedure, and site was verified.   Injection Procedure Details:  Procedure Site One Meds Administered:  Meds ordered this encounter  Medications  . bupivacaine (MARCAINE) 0.5 % (with pres) injection 3 mL  . methylPREDNISolone acetate (DEPO-MEDROL) injection 20 mg     Laterality: Right  Location/Site:  L1-L2  Needle size: 22 ga.  Needle type:spinal  Needle Placement: Oblique pedical  Findings:   -Comments: There was excellent flow of contrast along the articular pillars without intravascular flow.  Procedure Details: The fluoroscope beam is vertically oriented in AP and then obliqued 15 to 20 degrees to the ipsilateral side of the desired nerve to achieve the "Scotty dog" appearance.  The skin over the target area of the junction of the superior articulating process and the transverse process (sacral ala if blocking the L5 dorsal rami) was locally anesthetized with a 1 ml volume of 1% Lidocaine without Epinephrine.  The spinal needle was inserted and advanced in a trajectory view down to the target.   After contact with periosteum and negative aspirate for blood and CSF, correct placement without intravascular or epidural spread was confirmed by injecting 0.5 ml. of Isovue-250.  A spot radiograph was obtained of this image.    Next, a 0.5 ml. volume of the injectate described above was injected. The needle was then redirected to the other facet joint nerves mentioned above if needed.  Prior to the procedure, the  patient was given a Pain Diary which was completed for baseline measurements.  After the procedure, the patient rated their pain every 30 minutes and will continue rating at this frequency for a total of 5 hours.  The patient has been asked to complete the Diary and return to [place] by mail, fax or hand delivered as soon as possible.   Additional Comments:  The patient tolerated the procedure well Dressing: 2 x 2 sterile gauze and Band-Aid    Post-procedure details: Patient was observed during the procedure. Post-procedure instructions were reviewed.  Patient left the clinic in stable condition.

## 2019-11-02 NOTE — Progress Notes (Signed)
Becky [name] - 64 y.o. female MRN [id number]  Date of birth: 31-Jul-1955  Office Visit Note: Visit Date: 11/01/2019 PCP: Adrian Prince, MD Referred by: Adrian Prince, MD  Subjective: Chief Complaint  Patient presents with  . Middle Back - Pain   HPI: Becky [name] is a 64 y.o. female who comes in today At the request of Dr. Vira Browns for diagnostic and hopefully therapeutic right L1-2 facet/medial branch block.  Patient is having axial right-sided low back pain.  Her pain is in a broad area and I think if she does not get diagnostic relief then consideration should be given to the lower facet joints.  I have actually seen the patient in the remote past and completed L2-3 interlaminar injection in 2016 followed by L4-5 and L5-S1 facet joints where she had more arthritic changes.  The facet joints at that point seem to help quite a bit.  She does report continued pain really since that time but worse over the last couple of years.  Rates her pain as an 8 out of 10.  No leg pain.  ROS Otherwise per HPI.  Assessment & Plan: Visit Diagnoses:  1. Spondylosis without myelopathy or radiculopathy, lumbar region   2. Chronic right-sided low back pain without sciatica     Plan: No additional findings.   Meds & Orders:  Meds ordered this encounter  Medications  . bupivacaine (MARCAINE) 0.5 % (with pres) injection 3 mL  . methylPREDNISolone acetate (DEPO-MEDROL) injection 20 mg    Orders Placed This Encounter  Procedures  . Facet Injection  . XR C-ARM NO REPORT    Follow-up: Return for Review Pain Diary.   Procedures: No procedures performed  Lumbar Diagnostic Facet Joint Nerve Block with Fluoroscopic Guidance   [name]      Date of Birth: Nov 14, 1955 MRN: [id number] PCP: Adrian Prince, MD      Visit Date: 11/01/2019   Universal Protocol:    Date/Time: 05/14/216:02 AM  Consent Given By: the patient  Position: PRONE  Additional Comments: Vital  signs were monitored before and after the procedure. Patient was prepped and draped in the usual sterile fashion. The correct patient, procedure, and site was verified.   Injection Procedure Details:  Procedure Site One Meds Administered:  Meds ordered this encounter  Medications  . bupivacaine (MARCAINE) 0.5 % (with pres) injection 3 mL  . methylPREDNISolone acetate (DEPO-MEDROL) injection 20 mg     Laterality: Right  Location/Site:  L1-L2  Needle size: 22 ga.  Needle type:spinal  Needle Placement: Oblique pedical  Findings:   -Comments: There was excellent flow of contrast along the articular pillars without intravascular flow.  Procedure Details: The fluoroscope beam is vertically oriented in AP and then obliqued 15 to 20 degrees to the ipsilateral side of the desired nerve to achieve the "Scotty dog" appearance.  The skin over the target area of the junction of the superior articulating process and the transverse process (sacral ala if blocking the L5 dorsal rami) was locally anesthetized with a 1 ml volume of 1% Lidocaine without Epinephrine.  The spinal needle was inserted and advanced in a trajectory view down to the target.   After contact with periosteum and negative aspirate for blood and CSF, correct placement without intravascular or epidural spread was confirmed by injecting 0.5 ml. of Isovue-250.  A spot radiograph was obtained of this image.    Next, a 0.5 ml. volume of the injectate described above was injected.  The needle was then redirected to the other facet joint nerves mentioned above if needed.  Prior to the procedure, the patient was given a Pain Diary which was completed for baseline measurements.  After the procedure, the patient rated their pain every 30 minutes and will continue rating at this frequency for a total of 5 hours.  The patient has been asked to complete the Diary and return to [place] by mail, fax or hand delivered as soon as  possible.   Additional Comments:  The patient tolerated the procedure well Dressing: 2 x 2 sterile gauze and Band-Aid    Post-procedure details: Patient was observed during the procedure. Post-procedure instructions were reviewed.  Patient left the clinic in stable condition.    Clinical History: MRI LUMBAR SPINE WITHOUT AND WITH CONTRAST  TECHNIQUE: Multiplanar and multiecho pulse sequences of the lumbar spine were obtained without and with intravenous contrast.  CONTRAST:  48mL MULTIHANCE GADOBENATE DIMEGLUMINE 529 MG/ML IV SOLN  COMPARISON:  None.  FINDINGS: Lumbar segmentation appears to be normal and will be designated as such for this report.  Grade 1 anterolisthesis at L5-S1. Moderate to severe disc and endplate degeneration at L1-L2, vacuum disc phenomena suspected at that level. Moderate associated endplate irregularity. Mild associated endplate enhancement and edema. Probable gas containing L2 superior endplate Schmorl's node associated.  No other marrow edema. No acute or suspicious osseous abnormality identified.  Visualized lower thoracic spinal cord is normal with conus medularis at L1.  Visualized abdominal viscera and paraspinal soft tissues are within normal limits.  T9-T10: Partially visible, grossly negative.  T10-T11:  Appears negative except for mild facet hypertrophy.  T11-T12:  Negative.  T12-L1:  Minimal disc bulge and facet hypertrophy.  L1-L2: Severe disc space loss and endplate changes detailed above. Right eccentric circumferential disc osteophyte complex. Mild facet and ligament flavum hypertrophy greater on the right. Mild to moderate right lateral recess stenosis (series 5, image 10). No significant spinal stenosis. Mild to moderate right L1 foraminal stenosis. No epidural or paraspinal soft tissue enhancement at this level.  L2-L3:  Negative.  L3-L4: Mild disc bulge and facet hypertrophy. Small left  foraminal component of disc best seen on series 5, image 22. Mild left L3 foraminal stenosis.  L4-L5: Negative disc. Mild to moderate facet hypertrophy. Trace facet joint fluid. No stenosis.  L5-S1: Chronic L5 pars fractures with moderate to severe facet hypertrophy. Trace facet joint fluid. Small posteriorly situated synovial cyst on the right. Circumferential disc/ pseudo disc. No spinal or lateral recess stenosis. Borderline to mild L5 foraminal stenosis.  IMPRESSION: 1. Severe lumbar disc and endplate degeneration at L1-L2, far out of proportion to disc and endplate changes elsewhere in the visible spine. Still, this has a benign appearance. Multifactorial mild to moderate right lateral recess and right L1 foraminal stenosis result. 2. Chronic L5 pars fractures and grade 1 L5-S1 spondylolisthesis. Associated disc and endplate degeneration, but only borderline to mild L5 foraminal stenosis. 3. Small foraminal disc protrusion at L3-L4 with mild left L3 foraminal stenosis.   Electronically Signed   By: Lars Pinks M.D.   On: 07/15/2014 20:08   She reports that she has never smoked. She has never used smokeless tobacco. No results for input(s): HGBA1C, LABURIC in the last 8760 hours.  Objective:  VS:  HT:    WT:   BMI:     BP:(!) 144/68  HR:81bpm  TEMP: ( )  RESP:  Physical Exam Constitutional:      General: She is  not in acute distress.    Appearance: Normal appearance. She is not ill-appearing.  HENT:     Head: Normocephalic and atraumatic.     Right Ear: External ear normal.     Left Ear: External ear normal.  Eyes:     Extraocular Movements: Extraocular movements intact.  Cardiovascular:     Rate and Rhythm: Normal rate.     Pulses: Normal pulses.  Musculoskeletal:     Right lower leg: No edema.     Left lower leg: No edema.     Comments: Patient has good distal strength with no pain over the greater trochanters.  No clonus or focal weakness.  Concordant  pain with facet joint loading on the right.  Skin:    Findings: No erythema, lesion or rash.  Neurological:     General: No focal deficit present.     Mental Status: She is alert and oriented to person, place, and time.     Sensory: No sensory deficit.     Motor: No weakness or abnormal muscle tone.     Coordination: Coordination normal.  Psychiatric:        Mood and Affect: Mood normal.        Behavior: Behavior normal.     Ortho Exam  Imaging: XR C-ARM NO REPORT  Result Date: 11/01/2019 Please see Notes tab for imaging impression.   Past Medical/Family/Surgical/Social History: Medications & Allergies reviewed per EMR, new medications updated. Patient Active Problem List   Diagnosis Date Noted  . Complicated UTI (urinary tract infection) 12/13/2018  . Failure of outpatient treatment 12/11/2018  . Acute metabolic encephalopathy 12/11/2018  . Fever 12/10/2018  . Breast cancer screening, high risk patient 05/09/2018  . Eosinophilia 01/31/2017  . Iritis 01/04/2017  . History of joint swelling 08/24/2016  . Osteoarthritis of knees, bilateral 08/24/2016  . History of migraine 08/24/2016  . History of depression 08/24/2016  . History of osteoporosis 08/24/2016  . DDD (degenerative disc disease), lumbar 08/24/2016  . High risk medication use 08/24/2016  . Trigger finger, left middle finger 08/24/2016  . Trigger finger, right ring finger 08/24/2016  . 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 Increased risk of breast cancer 07/06/2015  . IDDM (insulin dependent diabetes mellitus) 07/06/2015  . 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 Sjogren's syndrome ([facility]) 07/06/2015  . Genetic testing 05/12/2015  . Family history of breast cancer 04/15/2015  . Fibrocystic breast disease 10/11/2012  . GERD 10/01/2009  . Chest pain with moderate risk for cardiac etiology 09/02/2009  . DYSPHAGIA 09/02/2009  . ANXIETY  08/15/2009  . ULCER-GASTRIC 08/15/2009  . CONSTIPATION 08/15/2009  . SLOW TRANSIT CONSTIPATION 08/15/2009  . FLATULENCE ERUCTATION AND GAS PAIN 08/15/2009   Past Medical History:  Diagnosis Date  . Abnormal ECG   . Anemia   . Anxiety   . Cataract    . Cateracts bil eyes removed  . GERD (gastroesophageal reflux disease)   . Heart murmur   . Hypercholesterolemia   . IDDM (insulin dependent diabetes mellitus) 1969  . Migraine headache   . Neuromuscular disorder ([facility])    bil legs & feet - neuropathy  . Neuropathy   . Osteoporosis    10/2012  . Otosclerosis   . RA (rheumatoid arthritis) ([facility]) 2000  . S/P cardiac cath August 2013   Normal coronaries and normal LV function.  . Sjoegren syndrome    Family History  Problem Relation Age of Onset  .  Breast cancer Mother 8  . Colon polyps Mother        unspecified number  . Uterine cancer Mother        metastatic  . Breast cancer Maternal Grandmother 108  . Stroke Father 65  . Rheum arthritis Sister   . Breast cancer Sister 48       lump; (-)GT (breast/ova ca panel, GeneDx)  . Skin cancer Maternal Grandfather        unspecified type  . Prostate cancer Maternal Grandfather        dx. late 56s  . Breast cancer Other        dx. in their late 85s  . Breast cancer Other        dx. 78-79  . Breast cancer Cousin        bilateral; dx. late 71s  . Colon cancer Neg Hx   . Esophageal cancer Neg Hx   . Rectal cancer Neg Hx   . Stomach cancer Neg Hx    Past Surgical History:  Procedure Laterality Date  . BREAST LUMPECTOMY  1995  . CARPAL TUNNEL RELEASE  2000  . CATARACT EXTRACTION Right 09-05-12  . CATARACT EXTRACTION Left 3/15  . CESAREAN SECTION  1987  . COLONOSCOPY    . EYE SURGERY     diabetic retinopathy   . HYSTEROSCOPY  02/2001   and D & C  . LYMPH NODE DISSECTION  2002  . TOE SURGERY  2000   bone removed   Social History   Occupational History  . Occupation: disability    Employer: UNEMPLOYED  Tobacco Use  .  Smoking status: Never Smoker  . Smokeless tobacco: Never Used  Substance and Sexual Activity  . Alcohol use: Yes    Alcohol/week: 4.0 standard drinks    Types: 2 Shots of liquor, 2 Standard drinks or equivalent per week    Comment: maybe 1-2 cocktails per wk  . Drug use: No  . Sexual activity: Yes    Partners: Male    Birth control/protection: Post-menopausal    Comment: vasectomy

## 2019-11-09 ENCOUNTER — Other Ambulatory Visit: Payer: Self-pay

## 2019-11-09 DIAGNOSIS — Z79899 Other long term (current) drug therapy: Secondary | ICD-10-CM | POA: Diagnosis not present

## 2019-11-10 LAB — CBC WITH DIFFERENTIAL/PLATELET
Absolute Monocytes: 329 cells/uL (ref 200–950)
Basophils Absolute: 71 cells/uL (ref 0–200)
Basophils Relative: 1.5 %
Eosinophils Absolute: 249 cells/uL (ref 15–500)
Eosinophils Relative: 5.3 %
HCT: 40.8 % (ref 35.0–45.0)
Hemoglobin: 13.3 g/dL (ref 11.7–15.5)
Lymphs Abs: 1452 cells/uL (ref 850–3900)
MCH: 30.4 pg (ref 27.0–33.0)
MCHC: 32.6 g/dL (ref 32.0–36.0)
MCV: 93.2 fL (ref 80.0–100.0)
MPV: 13.7 fL — ABNORMAL HIGH (ref 7.5–12.5)
Monocytes Relative: 7 %
Neutro Abs: 2599 cells/uL (ref 1500–7800)
Neutrophils Relative %: 55.3 %
Platelets: 237 10*3/uL (ref 140–400)
RBC: 4.38 10*6/uL (ref 3.80–5.10)
RDW: 13.1 % (ref 11.0–15.0)
Total Lymphocyte: 30.9 %
WBC: 4.7 10*3/uL (ref 3.8–10.8)

## 2019-11-10 LAB — COMPLETE METABOLIC PANEL WITH GFR
AG Ratio: 1.7 (calc) (ref 1.0–2.5)
ALT: 24 U/L (ref 6–29)
AST: 24 U/L (ref 10–35)
Albumin: 4 g/dL (ref 3.6–5.1)
Alkaline phosphatase (APISO): 82 U/L (ref 37–153)
BUN/Creatinine Ratio: 15 (calc) (ref 6–22)
BUN: 17 mg/dL (ref 7–25)
CO2: 26 mmol/L (ref 20–32)
Calcium: 9.5 mg/dL (ref 8.6–10.4)
Chloride: 103 mmol/L (ref 98–110)
Creat: 1.14 mg/dL — ABNORMAL HIGH (ref 0.50–0.99)
GFR, Est African American: 59 mL/min/{1.73_m2} — ABNORMAL LOW (ref >=60)
GFR, Est Non African American: 51 mL/min/{1.73_m2} — ABNORMAL LOW (ref >=60)
Globulin: 2.4 g/dL (calc) (ref 1.9–3.7)
Glucose, Bld: 352 mg/dL — ABNORMAL HIGH (ref 65–99)
Potassium: 4.4 mmol/L (ref 3.5–5.3)
Sodium: 137 mmol/L (ref 135–146)
Total Bilirubin: 0.4 mg/dL (ref 0.2–1.2)
Total Protein: 6.4 g/dL (ref 6.1–8.1)

## 2019-11-11 NOTE — Progress Notes (Signed)
Blood glucose is very high. GFR is low.She is on diuretics. She had no synovitis at the last visit.She may discontinue MTX. If her symptoms flare then she should contact us.

## 2019-11-12 ENCOUNTER — Other Ambulatory Visit: Payer: Self-pay

## 2019-11-15 ENCOUNTER — Telehealth: Payer: Self-pay | Admitting: Pharmacy Technician

## 2019-11-15 NOTE — Telephone Encounter (Signed)
Submitted a Prior Authorization request to [employer] for ENBREL via Cover My Meds. Will update once we receive a response.  (Key: [id number])

## 2019-11-15 NOTE — Telephone Encounter (Signed)
Received notification from [facility] regarding a prior authorization for ENBREL. Authorization has been APPROVED from 11/15/19 to 11/14/20.   Authorization # [id number]

## 2019-11-21 DIAGNOSIS — G43719 Chronic migraine without aura, intractable, without status migrainosus: Secondary | ICD-10-CM | POA: Diagnosis not present

## 2019-11-21 DIAGNOSIS — M542 Cervicalgia: Secondary | ICD-10-CM | POA: Diagnosis not present

## 2019-11-21 DIAGNOSIS — G518 Other disorders of facial nerve: Secondary | ICD-10-CM | POA: Diagnosis not present

## 2019-11-21 DIAGNOSIS — M791 Myalgia, unspecified site: Secondary | ICD-10-CM | POA: Diagnosis not present

## 2019-11-22 ENCOUNTER — Ambulatory Visit: Payer: Medicare Other | Admitting: Specialist

## 2019-11-29 ENCOUNTER — Ambulatory Visit: Payer: Medicare Other | Admitting: Surgery

## 2019-12-06 ENCOUNTER — Ambulatory Visit: Payer: Medicare Other | Admitting: Surgery

## 2019-12-06 DIAGNOSIS — G8929 Other chronic pain: Secondary | ICD-10-CM

## 2019-12-06 DIAGNOSIS — M533 Sacrococcygeal disorders, not elsewhere classified: Secondary | ICD-10-CM

## 2019-12-06 DIAGNOSIS — M4316 Spondylolisthesis, lumbar region: Secondary | ICD-10-CM

## 2019-12-06 NOTE — Progress Notes (Signed)
63 year old female returns for recheck of her low back pain after lumbar ESI with Dr. Alvester Morin Nov 01, 2019.  Patient states that she may have had 70% improvement of her right sided low back pain for a few hours after the injection.  She is localizing most of her pain to the right SI joint.  Denies any lower extremity radicular symptoms.  Pain in this joint with activity.   Exam Extremely pleasant white female alert and oriented in no acute distress.  Gait is normal.  Patient has moderate tenderness over the right SI joint.  No sciatic notch tenderness.  Nontender over the bilateral hip greater trochanter bursa.  Negative straight leg raise.  Positive right FABER test.  Negative on the left side.   Assessment 1.  Lumbar spondylosis 2.  Right SI joint pain   Plan I will have patient come in Monday with Dr. Prince Rome to perform a diagnostic/therapeutic right SI joint Marcaine/Depo-Medrol injection.  Patient will pay close attention how she feels after this is done.  I will then have her follow-up with me in 2 weeks to check her response.  If she does not have dramatic improvement I will then schedule lumbar MRI and this can be compared to her study that she had in 2015 from her report.  States that she has not had an updated study.  All questions answered.

## 2019-12-07 ENCOUNTER — Ambulatory Visit: Payer: Medicare Other | Admitting: Family Medicine

## 2019-12-07 DIAGNOSIS — R350 Frequency of micturition: Secondary | ICD-10-CM | POA: Diagnosis not present

## 2019-12-10 ENCOUNTER — Ambulatory Visit: Payer: Medicare Other | Admitting: Family Medicine

## 2019-12-10 ENCOUNTER — Other Ambulatory Visit: Payer: Self-pay

## 2019-12-10 ENCOUNTER — Ambulatory Visit: Payer: Self-pay

## 2019-12-10 ENCOUNTER — Encounter: Payer: Self-pay | Admitting: Family Medicine

## 2019-12-10 DIAGNOSIS — G8929 Other chronic pain: Secondary | ICD-10-CM | POA: Diagnosis not present

## 2019-12-10 DIAGNOSIS — M533 Sacrococcygeal disorders, not elsewhere classified: Secondary | ICD-10-CM

## 2019-12-10 NOTE — Progress Notes (Signed)
Subjective: Patient is here for ultrasound-guided intra-articular right SI joint injection.   She has been dealing with constant pain for quite a while.  Objective: Tender at the superior aspect of the right SI joint.  Procedure: Ultrasound-guided right SI injection: After sterile prep with Betadine, injected 5 cc 1% lidocaine without epinephrine and 40 mg methylprednisolone using a 22-gauge spinal needle, passing the needle through the sacroiliac ligament into the region of the SI joint.  She had good relief during the immediate anesthetic phase.  Follow-up as directed.

## 2019-12-27 ENCOUNTER — Encounter: Payer: Self-pay | Admitting: Surgery

## 2019-12-27 ENCOUNTER — Ambulatory Visit: Payer: Medicare Other | Admitting: Surgery

## 2019-12-27 VITALS — BP 129/68 | HR 81 | Ht 64.0 in | Wt 128.0 lb

## 2019-12-27 DIAGNOSIS — M4807 Spinal stenosis, lumbosacral region: Secondary | ICD-10-CM | POA: Diagnosis not present

## 2019-12-27 DIAGNOSIS — M4316 Spondylolisthesis, lumbar region: Secondary | ICD-10-CM

## 2019-12-27 DIAGNOSIS — M5136 Other intervertebral disc degeneration, lumbar region: Secondary | ICD-10-CM

## 2019-12-27 NOTE — Progress Notes (Signed)
64 year old white female returns for recheck after having right SI joint injection.  She states that she did have good relief of her SI joint pain for about a day.  She also had temporary improvement with previous lumbar ESI with Dr. Alvester Morin Nov 01, 2019.   Plan I will go ahead and schedule lumbar MRI to rule out HNP/stenosis and compare to study that was done in 2015.  She will follow-up with Dr. Otelia Sergeant after completion to discuss results and further treatment options.  Since she had good diagnostic improvement of her right SI joint pain I think it may be worthwhile to send her to Dr. Alvester Morin for fluoroscopic guidance repeat right SI joint Marcaine/Depo-Medrol injection and see if she is a potential candidate for SI RF ablation.  See with Dr. Otelia Sergeant thinks when he sees her back to review her study.  All questions answered.

## 2019-12-31 ENCOUNTER — Other Ambulatory Visit (HOSPITAL_BASED_OUTPATIENT_CLINIC_OR_DEPARTMENT_OTHER): Payer: Self-pay | Admitting: Otolaryngology

## 2020-01-02 ENCOUNTER — Other Ambulatory Visit: Payer: Self-pay | Admitting: *Deleted

## 2020-01-02 DIAGNOSIS — G518 Other disorders of facial nerve: Secondary | ICD-10-CM | POA: Diagnosis not present

## 2020-01-02 DIAGNOSIS — Z79899 Other long term (current) drug therapy: Secondary | ICD-10-CM

## 2020-01-02 DIAGNOSIS — M542 Cervicalgia: Secondary | ICD-10-CM | POA: Diagnosis not present

## 2020-01-02 DIAGNOSIS — M791 Myalgia, unspecified site: Secondary | ICD-10-CM | POA: Diagnosis not present

## 2020-01-02 DIAGNOSIS — G43719 Chronic migraine without aura, intractable, without status migrainosus: Secondary | ICD-10-CM | POA: Diagnosis not present

## 2020-01-02 MED ORDER — ENBREL SURECLICK 50 MG/ML ~~LOC~~ SOAJ: 50.0000 mg | SUBCUTANEOUS | 0 refills | Status: DC

## 2020-01-02 NOTE — Telephone Encounter (Signed)
Refill request received via fax  Last Visit: 09/13/2019 Next Visit: 02/14/2020 Labs: 11/09/2019 Blood glucose is very high. GFR is low. TB Gold: 08/01/2019  Current Dose per office note 09/13/2019: Enbrel SureClick 50 mg every 7 days  DX: Rheumatoid arthritis   Okay to refill Enbrel?

## 2020-01-04 DIAGNOSIS — H04123 Dry eye syndrome of bilateral lacrimal glands: Secondary | ICD-10-CM | POA: Diagnosis not present

## 2020-01-04 DIAGNOSIS — F418 Other specified anxiety disorders: Secondary | ICD-10-CM | POA: Diagnosis not present

## 2020-01-04 DIAGNOSIS — N1831 Chronic kidney disease, stage 3a: Secondary | ICD-10-CM | POA: Diagnosis not present

## 2020-01-04 DIAGNOSIS — E1042 Type 1 diabetes mellitus with diabetic polyneuropathy: Secondary | ICD-10-CM | POA: Diagnosis not present

## 2020-01-05 ENCOUNTER — Other Ambulatory Visit: Payer: Self-pay

## 2020-01-05 ENCOUNTER — Ambulatory Visit (HOSPITAL_BASED_OUTPATIENT_CLINIC_OR_DEPARTMENT_OTHER)
Admission: RE | Admit: 2020-01-05 | Discharge: 2020-01-05 | Disposition: A | Discharge status: Home / Self Care | Payer: Medicare Other | Source: Ambulatory Visit | Attending: Surgery | Admitting: Surgery

## 2020-01-05 DIAGNOSIS — M4807 Spinal stenosis, lumbosacral region: Secondary | ICD-10-CM | POA: Diagnosis not present

## 2020-01-17 DIAGNOSIS — G43719 Chronic migraine without aura, intractable, without status migrainosus: Secondary | ICD-10-CM | POA: Diagnosis not present

## 2020-01-17 DIAGNOSIS — M791 Myalgia, unspecified site: Secondary | ICD-10-CM | POA: Diagnosis not present

## 2020-01-17 DIAGNOSIS — G518 Other disorders of facial nerve: Secondary | ICD-10-CM | POA: Diagnosis not present

## 2020-01-17 DIAGNOSIS — M542 Cervicalgia: Secondary | ICD-10-CM | POA: Diagnosis not present

## 2020-01-23 ENCOUNTER — Ambulatory Visit: Payer: Medicare Other | Admitting: Specialist

## 2020-01-23 ENCOUNTER — Encounter: Payer: Self-pay | Admitting: Specialist

## 2020-01-23 VITALS — BP 131/74 | HR 86 | Ht 64.0 in | Wt 122.0 lb

## 2020-01-23 DIAGNOSIS — M533 Sacrococcygeal disorders, not elsewhere classified: Secondary | ICD-10-CM

## 2020-01-23 DIAGNOSIS — M47816 Spondylosis without myelopathy or radiculopathy, lumbar region: Secondary | ICD-10-CM | POA: Diagnosis not present

## 2020-01-23 DIAGNOSIS — G8929 Other chronic pain: Secondary | ICD-10-CM

## 2020-01-23 DIAGNOSIS — M5136 Other intervertebral disc degeneration, lumbar region: Secondary | ICD-10-CM | POA: Diagnosis not present

## 2020-01-23 DIAGNOSIS — E1042 Type 1 diabetes mellitus with diabetic polyneuropathy: Secondary | ICD-10-CM | POA: Diagnosis not present

## 2020-01-23 NOTE — Progress Notes (Signed)
Consult Note   [name]             Date of Birth: [birth year]           MRN: [id number]             Visit Date: 01/23/2020  Requested by: Adrian Prince, MD [address] [place],  [place] [address] PCP: Adrian Prince, MD  Thank you for asking me to evaluate this patient for Follow-up of the Lower Back (MRI Review)  Below are my findings.     Assessment & Plan: Visit Diagnoses:  1. Chronic right SI joint pain   2. Spondylosis without myelopathy or radiculopathy, lumbar region   3. DDD (degenerative disc disease), lumbar     Plan: Avoid bending, stooping and avoid lifting weights greater than 10 lbs. Avoid prolong standing and walking. Avoid frequent bending and stooping  No lifting greater than 10 lbs. May use ice or moist heat for pain. Weight loss is of benefit. Handicap license is approved. Dr. [place] Blas secretary/Assistant will call to arrange for assessment of the back for consideration of RFA of the right upper lumbar facets to decrease pain associated with the arthritis joints.    Follow Up Instructions: Return in about 6 weeks (around 03/05/2020).  Orders: No orders of the defined types were placed in this encounter.  No orders of the defined types were placed in this encounter.    Procedures: No procedures performed   Clinical Data: No additional findings.   Subjective:  Chief Complaint  Patient presents with  . Lower Back - Follow-up    MRI Review     HPI  Review of Systems   Objective: Vital Signs: BP 131/74 (BP Location: Left Arm, Patient Position: Sitting)   Pulse 86   Ht 5\' 4"  (1.626 m)   Wt 122 lb (55.3 kg)   LMP 09/19/2008   BMI 20.94 kg/m   Physical Exam Constitutional:      Appearance: She is well-developed.  HENT:     Head: Normocephalic and atraumatic.  Eyes:     Pupils: Pupils are equal, round, and reactive to light.  Pulmonary:     Effort: Pulmonary effort is normal.     Breath sounds: Normal breath  sounds.  Abdominal:     General: Bowel sounds are normal.     Palpations: Abdomen is soft.  Musculoskeletal:     Cervical back: Normal range of motion and neck supple.     Lumbar back: Negative right straight leg raise test and negative left straight leg raise test.  Skin:    General: Skin is warm and dry.  Neurological:     Mental Status: She is alert and oriented to person, place, and time.  Psychiatric:        Behavior: Behavior normal.        Thought Content: Thought content normal.        Judgment: Judgment normal.      Back Exam   Tenderness  The patient is experiencing tenderness in the lumbar and sacroiliac.  Range of Motion  Extension:  20 abnormal  Flexion: abnormal  Lateral bend right: normal  Lateral bend left: normal  Rotation right: normal  Rotation left: normal   Muscle Strength  Right Quadriceps:  5/5  Left Quadriceps:  5/5  Right Hamstrings:  5/5  Left Hamstrings:  5/5   Tests  Straight leg raise right: negative Straight leg raise left: negative  Reflexes  Patellar: 1/4 Achilles:  1/4  Other  Toe walk: normal Heel walk: normal Erythema: no back redness Scars: absent       Specialty Comments:  No specialty comments available.  Imaging:  No results found.   PMFS History: Patient Active Problem List   Diagnosis Date Noted  . Complicated UTI (urinary tract infection) 12/13/2018  . Failure of outpatient treatment 12/11/2018  . Acute metabolic encephalopathy 12/11/2018  . Fever 12/10/2018  . Breast cancer screening, high risk patient 05/09/2018  . Eosinophilia 01/31/2017  . Iritis 01/04/2017  . History of joint swelling 08/24/2016  . Osteoarthritis of knees, bilateral 08/24/2016  . History of migraine 08/24/2016  . History of depression 08/24/2016  . History of osteoporosis 08/24/2016  . DDD (degenerative disc disease), lumbar 08/24/2016  . High risk medication use 08/24/2016  . Trigger finger, left middle finger 08/24/2016  .  Trigger finger, right ring finger 08/24/2016  . 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 Increased risk of breast cancer 07/06/2015  . IDDM (insulin dependent diabetes mellitus) 07/06/2015  . 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 Sjogren's syndrome (HCC) 07/06/2015  . Genetic testing 05/12/2015  . Family history of breast cancer 04/15/2015  . Fibrocystic breast disease 10/11/2012  . GERD 10/01/2009  . Chest pain with moderate risk for cardiac etiology 09/02/2009  . DYSPHAGIA 09/02/2009  . ANXIETY 08/15/2009  . ULCER-GASTRIC 08/15/2009  . CONSTIPATION 08/15/2009  . SLOW TRANSIT CONSTIPATION 08/15/2009  . FLATULENCE ERUCTATION AND GAS PAIN 08/15/2009   Past Medical History:  Diagnosis Date  . Abnormal ECG   . Anemia   . Anxiety   . Cataract    . Cateracts bil eyes removed  . GERD (gastroesophageal reflux disease)   . Heart murmur   . Hypercholesterolemia   . IDDM (insulin dependent diabetes mellitus) 1969  . Migraine headache   . Neuromuscular disorder (HCC)    bil legs & feet - neuropathy  . Neuropathy   . Osteoporosis    10/2012  . Otosclerosis   . RA (rheumatoid arthritis) (HCC) 2000  . S/P cardiac cath August 2013   Normal coronaries and normal LV function.  . Sjoegren syndrome     Family History  Problem Relation Age of Onset  . Breast cancer Mother 30  . Colon polyps Mother        unspecified number  . Uterine cancer Mother        metastatic  . Breast cancer Maternal Grandmother 103  . Stroke Father 101  . Rheum arthritis Sister   . Breast cancer Sister 48       lump; (-)GT (breast/ova ca panel, GeneDx)  . Skin cancer Maternal Grandfather        unspecified type  . Prostate cancer Maternal Grandfather        dx. late 34s  . Breast cancer Other        dx. in their late 40s  . Breast cancer Other        dx. 78-79  . Breast cancer Cousin        bilateral; dx. late 87s  . Colon  cancer Neg Hx   . Esophageal cancer Neg Hx   . Rectal cancer Neg Hx   . Stomach cancer Neg Hx    Past Surgical History:  Procedure Laterality Date  . BREAST LUMPECTOMY  1995  . CARPAL TUNNEL RELEASE  2000  . CATARACT EXTRACTION Right 09-05-12  . CATARACT EXTRACTION Left 3/15  .  CESAREAN SECTION  1987  . COLONOSCOPY    . EYE SURGERY     diabetic retinopathy   . HYSTEROSCOPY  02/2001   and D & C  . LYMPH NODE DISSECTION  2002  . TOE SURGERY  2000   bone removed   Social History   Occupational History  . Occupation: disability    Employer: UNEMPLOYED  Tobacco Use  . Smoking status: Never Smoker  . Smokeless tobacco: Never Used  Vaping Use  . Vaping Use: Never used  Substance and Sexual Activity  . Alcohol use: Yes    Alcohol/week: 4.0 standard drinks    Types: 2 Shots of liquor, 2 Standard drinks or equivalent per week    Comment: maybe 1-2 cocktails per wk  . Drug use: No  . Sexual activity: Yes    Partners: Male    Birth control/protection: Post-menopausal    Comment: vasectomy

## 2020-01-23 NOTE — Patient Instructions (Signed)
Avoid bending, stooping and avoid lifting weights greater than 10 lbs. Avoid prolong standing and walking. Avoid frequent bending and stooping  No lifting greater than 10 lbs. May use ice or moist heat for pain. Weight loss is of benefit. Handicap license is approved. Dr. [place] Blas secretary/Assistant will call to arrange for assessment of the back for consideration of RFA of the right upper lumbar facets to decrease pain associated with the arthritis joints.

## 2020-01-23 NOTE — Progress Notes (Signed)
Office Visit Note   [name]           Date of Birth: 02-28-1956           MRN: [id number] Visit Date: 01/23/2020              Requested by: Adrian Prince, MD [address] [place],  [place] [address] PCP: Adrian Prince, MD   Assessment & Plan: Visit Diagnoses:  1. Chronic right SI joint pain   2. Spondylosis without myelopathy or radiculopathy, lumbar region   3. DDD (degenerative disc disease), lumbar     Plan: Avoid bending, stooping and avoid lifting weights greater than 10 lbs. Avoid prolong standing and walking. Avoid frequent bending and stooping  No lifting greater than 10 lbs. May use ice or moist heat for pain. Weight loss is of benefit. Handicap license is approved. Dr. [place] Blas secretary/Assistant will call to arrange for assessment of the back for consideration of RFA of the right upper lumbar facets to decrease pain associated with the arthritis joints.    Follow-Up Instructions: Return in about 6 weeks (around 03/05/2020).   Orders:  No orders of the defined types were placed in this encounter.  No orders of the defined types were placed in this encounter.     Procedures: No procedures performed   Clinical Data: No additional findings.   Subjective: Chief Complaint  Patient presents with  . Lower Back - Follow-up    MRI Review    64 year old female with with history of RA and previous right upper buttock pain and right upper lumbar pain. Findings include DDD L1-2 with a lumbar scoliosis that is mild. She has had both right SI joint injectionand right L1-2 facet block, both helped the pain at the time. Due to persistent recurring pain MRI was done and the results show mild  Right L1-2  and bilateral L5-S1 foramenal narrowing, no central stenosis but spondylosis of the facets both L1-2 and L5-S1 and bilateral SI sclerosis with right greater than left SI posterior edema. Her pain is mainly in the right upper lumbar spine today.     Review of Systems   Objective: Vital Signs: BP 131/74 (BP Location: Left Arm, Patient Position: Sitting)   Pulse 86   Ht 5\' 4"  (1.626 m)   Wt 122 lb (55.3 kg)   LMP 09/19/2008   BMI 20.94 kg/m   Physical Exam  Ortho Exam  Specialty Comments:  No specialty comments available.  Imaging: No results found.   PMFS History: Patient Active Problem List   Diagnosis Date Noted  . Complicated UTI (urinary tract infection) 12/13/2018  . Failure of outpatient treatment 12/11/2018  . Acute metabolic encephalopathy 12/11/2018  . Fever 12/10/2018  . Breast cancer screening, high risk patient 05/09/2018  . Eosinophilia 01/31/2017  . Iritis 01/04/2017  . History of joint swelling 08/24/2016  . Osteoarthritis of knees, bilateral 08/24/2016  . History of migraine 08/24/2016  . History of depression 08/24/2016  . History of osteoporosis 08/24/2016  . DDD (degenerative disc disease), lumbar 08/24/2016  . High risk medication use 08/24/2016  . Trigger finger, left middle finger 08/24/2016  . Trigger finger, right ring finger 08/24/2016  . 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 Increased risk of breast cancer 07/06/2015  . IDDM (insulin dependent diabetes mellitus) 07/06/2015  . 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 Sjogren's syndrome (HCC) 07/06/2015  . Genetic testing 05/12/2015  .  Family history of breast cancer 04/15/2015  . Fibrocystic breast disease 10/11/2012  . GERD 10/01/2009  . Chest pain with moderate risk for cardiac etiology 09/02/2009  . DYSPHAGIA 09/02/2009  . ANXIETY 08/15/2009  . ULCER-GASTRIC 08/15/2009  . CONSTIPATION 08/15/2009  . SLOW TRANSIT CONSTIPATION 08/15/2009  . FLATULENCE ERUCTATION AND GAS PAIN 08/15/2009   Past Medical History:  Diagnosis Date  . Abnormal ECG   . Anemia   . Anxiety   . Cataract    . Cateracts bil eyes removed  . GERD (gastroesophageal reflux  disease)   . Heart murmur   . Hypercholesterolemia   . IDDM (insulin dependent diabetes mellitus) 1969  . Migraine headache   . Neuromuscular disorder (HCC)    bil legs & feet - neuropathy  . Neuropathy   . Osteoporosis    10/2012  . Otosclerosis   . RA (rheumatoid arthritis) (HCC) 2000  . S/P cardiac cath August 2013   Normal coronaries and normal LV function.  . Sjoegren syndrome     Family History  Problem Relation Age of Onset  . Breast cancer Mother 70  . Colon polyps Mother        unspecified number  . Uterine cancer Mother        metastatic  . Breast cancer Maternal Grandmother 52  . Stroke Father 75  . Rheum arthritis Sister   . Breast cancer Sister 48       lump; (-)GT (breast/ova ca panel, GeneDx)  . Skin cancer Maternal Grandfather        unspecified type  . Prostate cancer Maternal Grandfather        dx. late 70s  . Breast cancer Other        dx. in their late 25s  . Breast cancer Other        dx. 78-79  . Breast cancer Cousin        bilateral; dx. late 80s  . Colon cancer Neg Hx   . Esophageal cancer Neg Hx   . Rectal cancer Neg Hx   . Stomach cancer Neg Hx     Past Surgical History:  Procedure Laterality Date  . BREAST LUMPECTOMY  1995  . CARPAL TUNNEL RELEASE  2000  . CATARACT EXTRACTION Right 09-05-12  . CATARACT EXTRACTION Left 3/15  . CESAREAN SECTION  1987  . COLONOSCOPY    . EYE SURGERY     diabetic retinopathy   . HYSTEROSCOPY  02/2001   and D & C  . LYMPH NODE DISSECTION  2002  . TOE SURGERY  2000   bone removed   Social History   Occupational History  . Occupation: disability    Employer: UNEMPLOYED  Tobacco Use  . Smoking status: Never Smoker  . Smokeless tobacco: Never Used  Vaping Use  . Vaping Use: Never used  Substance and Sexual Activity  . Alcohol use: Yes    Alcohol/week: 4.0 standard drinks    Types: 2 Shots of liquor, 2 Standard drinks or equivalent per week    Comment: maybe 1-2 cocktails per wk  . Drug use: No    . Sexual activity: Yes    Partners: Male    Birth control/protection: Post-menopausal    Comment: vasectomy

## 2020-01-31 NOTE — Progress Notes (Signed)
Office Visit Note  [name]             Date of Birth: [birth year]           MRN: [id number]             PCP: Adrian Prince, MD Referring: Adrian Prince, MD Visit Date: 02/14/2020 Occupation: @GUAROCC @  Subjective:  Pain in both knee joints   History of Present Illness: Becky [name] is a 64 y.o. female with history of seropositive rheumatoid arthritis, iritis, and Sjogren syndrome. She is on Enbrel 50 mg subcutaneous injections every 14 days. She has been spacing enbrel to every 14 days for the past 2 months. She denies any recent rheumatoid arthritis flares.  She has been experiencing intermittent discomfort in boht knee joints, which is exacerbated by bending and kneeling. She denies any warmth or joint swelling.  She continues to have chronic lower back pain.  She was evaluated by Dr. Otelia Sergeant and had a facet injection on 11/01/19.  She did not have any pain relief.  She is going to undergo a nerve ablation on 03/03/20.      Activities of Daily Living:  Patient reports morning stiffness for 5  minutes.   Patient Denies nocturnal pain.  Difficulty dressing/grooming: Denies Difficulty climbing stairs: Denies Difficulty getting out of chair: Denies Difficulty using hands for taps, buttons, cutlery, and/or writing: Denies  Review of Systems  Constitutional: Negative for fatigue.  HENT: Positive for mouth dryness. Negative for mouth sores and nose dryness.   Eyes: Positive for dryness. Negative for pain and visual disturbance.  Respiratory: Negative for cough, hemoptysis, shortness of breath and difficulty breathing.   Cardiovascular: Negative for chest pain, palpitations, hypertension and swelling in legs/feet.  Gastrointestinal: Positive for constipation. Negative for blood in stool and diarrhea.  Endocrine: Negative for increased urination.  Genitourinary: Negative for painful urination.  Musculoskeletal: Positive for arthralgias, joint pain and morning  stiffness. Negative for joint swelling, myalgias, muscle weakness, muscle tenderness and myalgias.  Skin: Negative for color change, pallor, rash, hair loss, nodules/bumps, skin tightness, ulcers and sensitivity to sunlight.  Allergic/Immunologic: Negative for susceptible to infections.  Neurological: Negative for dizziness, numbness, headaches and weakness.  Hematological: Negative for swollen glands.  Psychiatric/Behavioral: Negative for depressed mood and sleep disturbance. The patient is not nervous/anxious.     PMFS History:  Patient Active Problem List   Diagnosis Date Noted  . Complicated UTI (urinary tract infection) 12/13/2018  . Failure of outpatient treatment 12/11/2018  . Acute metabolic encephalopathy 12/11/2018  . Fever 12/10/2018  . Breast cancer screening, high risk patient 05/09/2018  . Eosinophilia 01/31/2017  . Iritis 01/04/2017  . History of joint swelling 08/24/2016  . Osteoarthritis of knees, bilateral 08/24/2016  . History of migraine 08/24/2016  . History of depression 08/24/2016  . History of osteoporosis 08/24/2016  . DDD (degenerative disc disease), lumbar 08/24/2016  . High risk medication use 08/24/2016  . Trigger finger, left middle finger 08/24/2016  . Trigger finger, right ring finger 08/24/2016  . 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 Increased risk of breast cancer 07/06/2015  . IDDM (insulin dependent diabetes mellitus) 07/06/2015  . 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 Sjogren's syndrome (HCC) 07/06/2015  . Genetic testing 05/12/2015  . Family history of breast cancer 04/15/2015  . Fibrocystic breast disease 10/11/2012  . GERD 10/01/2009  . Chest pain with moderate risk for cardiac etiology 09/02/2009  .  DYSPHAGIA 09/02/2009  . ANXIETY 08/15/2009  . ULCER-GASTRIC 08/15/2009  . CONSTIPATION 08/15/2009  . SLOW TRANSIT CONSTIPATION 08/15/2009  . FLATULENCE  ERUCTATION AND GAS PAIN 08/15/2009    Past Medical History:  Diagnosis Date  . Abnormal ECG   . Anemia   . Anxiety   . Cataract    . Cateracts bil eyes removed  . GERD (gastroesophageal reflux disease)   . Heart murmur   . Hypercholesterolemia   . IDDM (insulin dependent diabetes mellitus) 1969  . Migraine headache   . Neuromuscular disorder (HCC)    bil legs & feet - neuropathy  . Neuropathy   . Osteoporosis    10/2012  . Otosclerosis   . RA (rheumatoid arthritis) (HCC) 2000  . S/P cardiac cath August 2013   Normal coronaries and normal LV function.  . Sjoegren syndrome     Family History  Problem Relation Age of Onset  . Breast cancer Mother 75  . Colon polyps Mother        unspecified number  . Uterine cancer Mother        metastatic  . Breast cancer Maternal Grandmother 66  . Stroke Father 65  . Rheum arthritis Sister   . Breast cancer Sister 48       lump; (-)GT (breast/ova ca panel, GeneDx)  . Skin cancer Maternal Grandfather        unspecified type  . Prostate cancer Maternal Grandfather        dx. late 94s  . Breast cancer Other        dx. in their late 75s  . Breast cancer Other        dx. 78-79  . Breast cancer Cousin        bilateral; dx. late 39s  . Colon cancer Neg Hx   . Esophageal cancer Neg Hx   . Rectal cancer Neg Hx   . Stomach cancer Neg Hx    Past Surgical History:  Procedure Laterality Date  . BREAST LUMPECTOMY  1995  . CARPAL TUNNEL RELEASE  2000  . CATARACT EXTRACTION Right 09-05-12  . CATARACT EXTRACTION Left 3/15  . CESAREAN SECTION  1987  . COLONOSCOPY    . EYE SURGERY     diabetic retinopathy   . HYSTEROSCOPY  02/2001   and D & C  . LYMPH NODE DISSECTION  2002  . TOE SURGERY  2000   bone removed   Social History   Social History Narrative  . Not on file   Immunization History  Administered Date(s) Administered  . PFIZER SARS-COV-2 Vaccination 07/11/2019, 08/01/2019  . Tdap 06/21/2013     Objective: Vital Signs: BP  112/70 (BP Location: Left Arm, Patient Position: Sitting, Cuff Size: Small)   Pulse 88   Resp 12   Ht  (1.626 m)   Wt 125 lb 6.4 oz (56.9 kg)   LMP 09/19/2008   BMI 21.52 kg/m    Physical Exam Vitals and nursing note reviewed.  Constitutional:      Appearance: She is well-developed.  HENT:     Head: Normocephalic and atraumatic.  Eyes:     Conjunctiva/sclera: Conjunctivae normal.  Pulmonary:     Effort: Pulmonary effort is normal.     Breath sounds: Normal breath sounds.  Abdominal:     Palpations: Abdomen is soft.  Musculoskeletal:     Cervical back: Normal range of motion.  Lymphadenopathy:     Cervical: No cervical adenopathy.  Skin:  General: Skin is warm and dry.     Capillary Refill: Capillary refill takes less than 2 seconds.  Neurological:     Mental Status: She is alert and oriented to person, place, and time.  Psychiatric:        Behavior: Behavior normal.      Musculoskeletal Exam: C-spine has good range of motion with no discomfort.  Shoulders, elbows, wrist joints, MCPs, PIPs and DIPs have good range of motion with no synovitis.  Synovial thickening of the right second and third MCP joints noted.  Hip joints have good range of motion with no discomfort.  Knee joints have good range of motion with no warmth or effusion.  Ankle joints have good range of motion with no tenderness or inflammation  CDAI Exam: CDAI Score: -- Patient Global: --; Provider Global: -- Swollen: --; Tender: -- Joint Exam 02/14/2020   No joint exam has been documented for this visit   There is currently no information documented on the homunculus. Go to the Rheumatology activity and complete the homunculus joint exam.  Investigation: No additional findings.  Imaging: XR Foot 2 Views Left  Result Date: 02/14/2020 First MTP, PIP and DIP narrowing was noted.  Narrowing of second third and fourth MTPs was noted.  Postsurgical changes were noted in the fourth phalanx.  Erosive  change was noted in the second metatarsal and possibly fourth metatarsal.  Juxta-articular osteopenia was noted.  Intertarsal joint space narrowing was noted.  Subtalar joint space narrowing was noted. Impression: These findings are consistent with rheumatoid arthritis and osteoarthritis overlap.  XR Foot 2 Views Right  Result Date: 02/14/2020 First MTP, PIP and DIP narrowing was noted.  None of the other MCP showed narrowing.  No erosive changes were noted.  Juxta-articular osteopenia was noted.  Intertarsal joint space narrowing was noted.  Subtalar joint space narrowing was noted.  Calcification of the blood vessels was noted. Impression: These findings are consistent with rheumatoid arthritis and osteoarthritis overlap.  XR Hand 2 View Left  Result Date: 02/14/2020 Juxta-articular osteopenia was noted.  MCP, PIP and DIP narrowing was noted.  Intercarpal and radiocarpal joint space narrowing was noted.  No erosive changes were noted. Impression: These findings are consistent with rheumatoid arthritis and osteoarthritis overlap.  XR Hand 2 View Right  Result Date: 02/14/2020 Juxta-articular osteopenia was noted.  Narrowing of MCP joints, PIP and DIP joints was noted.  Intercarpal and radiocarpal joint space narrowing was noted.  No erosive changes were noted. Impression: These findings are consistent with rheumatoid arthritis and osteoarthritis overlap.  XR KNEE 3 VIEW LEFT  Result Date: 02/14/2020 Moderate medial compartment narrowing was noted.  No patellofemoral narrowing was noted.  No chondrocalcinosis was noted.  Calcification of the blood vessels was noted. Impression: These findings are consistent with moderate osteoarthritis of the knee joint.  XR KNEE 3 VIEW RIGHT  Result Date: 02/14/2020 Moderate medial compartment narrowing was noted.  No patellofemoral narrowing was noted.  No chondrocalcinosis was noted. Impression: These findings are consistent with moderate osteoarthritis of the  knee joint.   Recent Labs: Lab Results  Component Value Date   WBC 4.7 11/09/2019   HGB 13.3 11/09/2019   PLT 237 11/09/2019   NA 137 11/09/2019   K 4.4 11/09/2019   CL 103 11/09/2019   CO2 26 11/09/2019   GLUCOSE 352 (H) 11/09/2019   BUN 17 11/09/2019   CREATININE 1.14 (H) 11/09/2019   BILITOT 0.4 11/09/2019   ALKPHOS 61 12/09/2018  AST 24 11/09/2019   ALT 24 11/09/2019   PROT 6.4 11/09/2019   ALBUMIN 3.5 12/09/2018   CALCIUM 9.5 11/09/2019   GFRAA 59 (L) 11/09/2019   QFTBGOLD NEGATIVE 04/06/2017   QFTBGOLDPLUS NEGATIVE 08/01/2019    Speciality Comments: Recieves Enbrel Sureclick through [employer].  Procedures:  No procedures performed Allergies: Macrobid [nitrofurantoin macrocrystal], Ciprofloxacin, and Penicillins   Assessment / Plan:     Visit Diagnoses: 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 without organ or systems involvement (HCC) - She has no tenderness or synovitis on exam.  She started injecting Enbrel 50 mg sq every 14 days 2 months ago on her own. She discontinued MTX in May 2021.  X-rays of both hands and feet were obtained today to assess for radiographic progression.  No erosive changes in her hands were noted.  Erosive changes noted in her feet as described above. She has been experiencing intermittent discomfort in her left foot.  She was advised to resume injecting Enbrel on a weekly basis.  She was advised to notify us if she develops increased joint pain or joint swelling.  She will follow up in 5 months. Plan: XR Hand 2 View Right, XR Hand 2 View Left, XR Foot 2 Views Left, XR Foot 2 Views Right, XR KNEE 3 VIEW RIGHT, XR KNEE 3 VIEW LEFT  High risk medication use - Enbrel SureClick 50 mg every 10 days, discontinued methotrexate & folic acid in May 2021. CBC and CMP are drawn on 11/09/2019.  She is due to update lab work today.  Order for CBC and CMP were released.  TB gold was negative on 08/01/2019 and will continue  to be monitored yearly. She has received her 3rd covid vaccine dose. She was advised to notify us or her PCP if she develops a COVID-19 infection in order to receive the antibody infusion.  She was encouraged to continue to wear a mask and social distance.  She voiced understanding. - Plan: CBC with Differential/Platelet, COMPLETE METABOLIC PANEL WITH GFR  Iritis: She has not had any recent flares.   Sjogren's syndrome with keratoconjunctivitis sicca (HCC): She continues to have chronic sicca symptoms which have been tolerable overall.   Trigger thumb, left thumb: resolved   Primary osteoarthritis of both knees:  She has good ROM of both knee joints.  No warmth or effusion of knee joints.  Her discomfort is exacerbated by bending and kneeling.  She has not noticed any warmth or swelling recently.  X-rays of both knees were updated today.  She has moderate osteoarthritis in both knees.  She would like to reapply for Visco gel injections for both knees.  We will notify her once they have been approved.   Chronic pain of both knees -She presents today with increased discomfort in both knee joints, which is exacerbated by bending and kneeling. She would like to reapply for visco gel injections for both knee joints.  She was given a handout of knee exercises to perform.  Plan: XR KNEE 3 VIEW RIGHT, XR KNEE 3 VIEW LEFT  DDD (degenerative disc disease), lumbar - She had a MRI in 2016 that revealed severe DDD and foraminal stenosis at multiple levels.   She continues to have persistent lower back pain.  She was referred to Dr. Otelia Sergeant and then had a facet injection performed on 11/01/19.  She had a repeat MRI of the lumbar spine obtained on 01/05/2020 which revealed overall mild degenerative changes greatest at L1-L2.  She is scheduled for nerve ablation on 03/03/2020.   History of osteoporosis - She had a recent DEXA ordered by Dr. Evlyn Kanner.  She previously was on fosamax followed by Prolia. She is only taking a  calcium supplement at this time.   Other medical conditions are listed as follows:   Other fatigue  History of gastroesophageal reflux (GERD)  History of depression  History of migraine  History of diabetes mellitus    Orders: Orders Placed This Encounter  Procedures  . XR Hand 2 View Right  . XR Hand 2 View Left  . XR Foot 2 Views Left  . XR Foot 2 Views Right  . XR KNEE 3 VIEW RIGHT  . XR KNEE 3 VIEW LEFT  . CBC with Differential/Platelet  . COMPLETE METABOLIC PANEL WITH GFR   No orders of the defined types were placed in this encounter.    Follow-Up Instructions: Return in about 5 months (around 07/16/2020) for Rheumatoid arthritis, Sjogren's syndrome.   Gearldine Bienenstock, PA-C  Note - This record has been created using Dragon software.  Chart creation errors have been sought, but may not always  have been located. Such creation errors do not reflect on  the standard of medical care.

## 2020-02-04 ENCOUNTER — Telehealth: Payer: Self-pay | Admitting: Rheumatology

## 2020-02-04 NOTE — Telephone Encounter (Signed)
Advised patient The booster vaccine is now available for immunocompromised patients. It is advised that if you had Pfizer vaccine you should get [employer] booster. If you had a Moderna vaccine then you should get a Moderna booster. [employer] does not have a booster vaccine at this time. Patient states she received pfizer and will get the [employer] booster. Advised patient that enbrel does not need to be held for the vaccine, per Sherron Ales, PA-C. Patient verbalized understanding.

## 2020-02-04 NOTE — Telephone Encounter (Signed)
Patient called requesting a return call with Dr. Fatima Sanger recommendation of the booster vaccine.

## 2020-02-04 NOTE — Telephone Encounter (Signed)
Please review the following recommendations.  CDC recommends receiving the covid-19 booster in patients who are moderately to severely immunosuppressed.     COVID-19 vaccine recommendations:   COVID-19 vaccine is recommended for everyone (unless you are allergic to a vaccine component), even if you are on a medication that suppresses your immune system.   If you are on Methotrexate, Cellcept (mycophenolate), Rinvoq, Becky [name], and Olumiant- hold the medication for 1 week after each vaccine. Hold Methotrexate for 2 weeks after the single dose COVID-19 vaccine.   If you are on Orencia subcutaneous injection - hold medication one week prior to and one week after the first COVID-19 vaccine dose (only).   If you are on Orencia IV infusions- time vaccination administration so that the first COVID-19 vaccination will occur four weeks after the infusion and postpone the subsequent infusion by one week.   If you are on Cyclophosphamide or Rituxan infusions please contact your doctor prior to receiving the COVID-19 vaccine.   Do not take Tylenol or ant anti-inflammatory medications (NSAIDs) 24 hours prior to the COVID-19 vaccination.   There is no direct evidence about the efficacy of the COVID-19 vaccine in individuals who are on medications that suppress the immune system.   Even if you are fully vaccinated, and you are on any medications that suppress your immune system, please continue to wear a mask, maintain at least six feet social distance and practice hand hygiene.   If you develop a COVID-19 infection, please contact your PCP or our office to determine if you need antibody infusion.  The booster vaccine is now available for immunocompromised patients. It is advised that if you had Pfizer vaccine you should get [employer] booster.  If you had a Moderna vaccine then you should get a Moderna booster. [employer] does not have a booster vaccine at this time.  Please see the following web  sites for updated information.   [email or web address]  [email or web address]

## 2020-02-07 ENCOUNTER — Encounter (INDEPENDENT_AMBULATORY_CARE_PROVIDER_SITE_OTHER): Payer: Medicare Other | Admitting: Ophthalmology

## 2020-02-13 DIAGNOSIS — M791 Myalgia, unspecified site: Secondary | ICD-10-CM | POA: Diagnosis not present

## 2020-02-13 DIAGNOSIS — M542 Cervicalgia: Secondary | ICD-10-CM | POA: Diagnosis not present

## 2020-02-13 DIAGNOSIS — G43719 Chronic migraine without aura, intractable, without status migrainosus: Secondary | ICD-10-CM | POA: Diagnosis not present

## 2020-02-13 DIAGNOSIS — G518 Other disorders of facial nerve: Secondary | ICD-10-CM | POA: Diagnosis not present

## 2020-02-14 ENCOUNTER — Ambulatory Visit: Payer: Self-pay

## 2020-02-14 ENCOUNTER — Encounter: Payer: Self-pay | Admitting: Physician Assistant

## 2020-02-14 ENCOUNTER — Ambulatory Visit: Payer: Medicare Other | Admitting: Physician Assistant

## 2020-02-14 ENCOUNTER — Other Ambulatory Visit: Payer: Self-pay

## 2020-02-14 ENCOUNTER — Telehealth: Payer: Self-pay

## 2020-02-14 VITALS — BP 112/70 | HR 88 | Resp 12 | Ht 64.0 in | Wt 125.4 lb

## 2020-02-14 DIAGNOSIS — M25562 Pain in left knee: Secondary | ICD-10-CM

## 2020-02-14 DIAGNOSIS — Z8669 Personal history of other diseases of the nervous system and sense organs: Secondary | ICD-10-CM

## 2020-02-14 DIAGNOSIS — M5136 Other intervertebral disc degeneration, lumbar region: Secondary | ICD-10-CM

## 2020-02-14 DIAGNOSIS — M0579 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 without organ or systems involvement: Secondary | ICD-10-CM | POA: Diagnosis not present

## 2020-02-14 DIAGNOSIS — M25561 Pain in right knee: Secondary | ICD-10-CM

## 2020-02-14 DIAGNOSIS — H209 Unspecified iridocyclitis: Secondary | ICD-10-CM | POA: Diagnosis not present

## 2020-02-14 DIAGNOSIS — Z8639 Personal history of other endocrine, nutritional and metabolic disease: Secondary | ICD-10-CM

## 2020-02-14 DIAGNOSIS — M170 Bilateral primary osteoarthritis of knee: Secondary | ICD-10-CM

## 2020-02-14 DIAGNOSIS — M65312 Trigger thumb, left thumb: Secondary | ICD-10-CM

## 2020-02-14 DIAGNOSIS — R5383 Other fatigue: Secondary | ICD-10-CM

## 2020-02-14 DIAGNOSIS — G8929 Other chronic pain: Secondary | ICD-10-CM

## 2020-02-14 DIAGNOSIS — M3501 Sicca syndrome with keratoconjunctivitis: Secondary | ICD-10-CM

## 2020-02-14 DIAGNOSIS — Z8659 Personal history of other mental and behavioral disorders: Secondary | ICD-10-CM

## 2020-02-14 DIAGNOSIS — M545 Low back pain, unspecified: Secondary | ICD-10-CM

## 2020-02-14 DIAGNOSIS — Z79899 Other long term (current) drug therapy: Secondary | ICD-10-CM

## 2020-02-14 DIAGNOSIS — Z8719 Personal history of other diseases of the digestive system: Secondary | ICD-10-CM

## 2020-02-14 DIAGNOSIS — Z8739 Personal history of other diseases of the musculoskeletal system and connective tissue: Secondary | ICD-10-CM

## 2020-02-14 DIAGNOSIS — M51369 Other intervertebral disc degeneration, lumbar region without mention of lumbar back pain or lower extremity pain: Secondary | ICD-10-CM

## 2020-02-14 NOTE — Patient Instructions (Addendum)
Standing Labs We placed an order today for your standing lab work.   Please have your standing labs drawn in November and every 3 months   If possible, please have your labs drawn 2 weeks prior to your appointment so that the provider can discuss your results at your appointment.  We have open lab daily Monday through Thursday from 8:30-12:30 PM and 1:30-4:30 PM and Friday from 8:30-12:30 PM and 1:30-4:00 PM at the office of Dr. Bo Merino, [facility].   Please be advised, patients with office appointments requiring lab work will take precedents over walk-in lab work.  If possible, please come for your lab work on Monday and Friday afternoons, as you may experience shorter wait times. The office is located at [address], [place], [place], [place] [address] No appointment is necessary.   Labs are drawn by [facility]. Please bring your co-pay at the time of your lab draw.  You may receive a bill from [facility] for your lab work.  If you wish to have your labs drawn at another location, please call the office 24 hours in advance to send orders.  If you have any questions regarding directions or hours of operation,  please call [phone].   As a reminder, please drink plenty of water prior to coming for your lab work. Thanks!  COVID-19 vaccine recommendations:   COVID-19 vaccine is recommended for everyone (unless you are allergic to a vaccine component), even if you are on a medication that suppresses your immune system.   If you are on Methotrexate, Cellcept (mycophenolate), Rinvoq, Morrie Sheldon, and Olumiant- hold the medication for 1 week after each vaccine. Hold Methotrexate for 2 weeks after the single dose COVID-19 vaccine.   If you are on Orencia subcutaneous injection - hold medication one week prior to and one week after the first COVID-19 vaccine dose (only).   If you are on Orencia IV infusions- time vaccination administration so that the first COVID-19  vaccination will occur four weeks after the infusion and postpone the subsequent infusion by one week.   If you are on Cyclophosphamide or Rituxan infusions please contact your doctor prior to receiving the COVID-19 vaccine.   Do not take Tylenol or any anti-inflammatory medications (NSAIDs) 24 hours prior to the COVID-19 vaccination.   There is no direct evidence about the efficacy of the COVID-19 vaccine in individuals who are on medications that suppress the immune system.   Even if you are fully vaccinated, and you are on any medications that suppress your immune system, please continue to wear a mask, maintain at least six feet social distance and practice hand hygiene.   If you develop a COVID-19 infection, please contact your PCP or our office to determine if you need antibody infusion.  The booster vaccine is now available for immunocompromised patients. It is advised that if you had Pfizer vaccine you should get Coca-[employer]a booster.  If you had a Moderna vaccine then you should get a Moderna booster. Johnson and Wynetta Emery does not have a booster vaccine at this time.  Please see the following web sites for updated information.   https://www.[facility]heumatology.org/Portals/0/Files/COVID-19-Vaccination-Patient-Resources.pdf  https://www.[facility]heumatology.org/About-Us/Newsroom/Press-Releases/ID/1159     Journal for Nurse Practitioners, 15(4), (217)[phone]. Retrieved March 27, 2018 from http://clinicalkey.com/nursing">  Knee Exercises Ask your health care provider which exercises are safe for you. Do exercises exactly as told by your health care provider and adjust them as directed. It is normal to feel mild stretching, pulling, tightness, or discomfort as you do these  exercises. Stop right away if you feel sudden pain or your pain gets worse. Do not begin these exercises until told by your health care provider. Stretching and range-of-motion exercises These exercises warm up your muscles and joints and  improve the movement and flexibility of your knee. These exercises also help to relieve pain and swelling. Knee extension, prone 1. Lie on your abdomen (prone position) on a bed. 2. Place your left / right knee just beyond the edge of the surface so your knee is not on the bed. You can put a towel under your left / right thigh just above your kneecap for comfort. 3. Relax your leg muscles and allow gravity to straighten your knee (extension). You should feel a stretch behind your left / right knee. 4. Hold this position for __________ seconds. 5. Scoot up so your knee is supported between repetitions. Repeat __________ times. Complete this exercise __________ times a day. Knee flexion, active  1. Lie on your back with both legs straight. If this causes back discomfort, bend your left / right knee so your foot is flat on the floor. 2. Slowly slide your left / right heel back toward your buttocks. Stop when you feel a gentle stretch in the front of your knee or thigh (flexion). 3. Hold this position for __________ seconds. 4. Slowly slide your left / right heel back to the starting position. Repeat __________ times. Complete this exercise __________ times a day. Quadriceps stretch, prone  1. Lie on your abdomen on a firm surface, such as a bed or padded floor. 2. Bend your left / right knee and hold your ankle. If you cannot reach your ankle or pant leg, loop a belt around your foot and grab the belt instead. 3. Gently pull your heel toward your buttocks. Your knee should not slide out to the side. You should feel a stretch in the front of your thigh and knee (quadriceps). 4. Hold this position for __________ seconds. Repeat __________ times. Complete this exercise __________ times a day. Hamstring, supine 1. Lie on your back (supine position). 2. Loop a belt or towel over the ball of your left / right foot. The ball of your foot is on the walking surface, right under your toes. 3. Straighten  your left / right knee and slowly pull on the belt to raise your leg until you feel a gentle stretch behind your knee (hamstring). ? Do not let your knee bend while you do this. ? Keep your other leg flat on the floor. 4. Hold this position for __________ seconds. Repeat __________ times. Complete this exercise __________ times a day. Strengthening exercises These exercises build strength and endurance in your knee. Endurance is the ability to use your muscles for a long time, even after they get tired. Quadriceps, isometric This exercise stretches the muscles in front of your thigh (quadriceps) without moving your knee joint (isometric). 1. Lie on your back with your left / right leg extended and your other knee bent. Put a rolled towel or small pillow under your knee if told by your health care provider. 2. Slowly tense the muscles in the front of your left / right thigh. You should see your kneecap slide up toward your hip or see increased dimpling just above the knee. This motion will push the back of the knee toward the floor. 3. For __________ seconds, hold the muscle as tight as you can without increasing your pain. 4. Relax the muscles slowly and completely.  Repeat __________ times. Complete this exercise __________ times a day. Straight leg raises This exercise stretches the muscles in front of your thigh (quadriceps) and the muscles that move your hips (hip flexors). 1. Lie on your back with your left / right leg extended and your other knee bent. 2. Tense the muscles in the front of your left / right thigh. You should see your kneecap slide up or see increased dimpling just above the knee. Your thigh may even shake a bit. 3. Keep these muscles tight as you raise your leg 4-6 inches (10-15 cm) off the floor. Do not let your knee bend. 4. Hold this position for __________ seconds. 5. Keep these muscles tense as you lower your leg. 6. Relax your muscles slowly and completely after each  repetition. Repeat __________ times. Complete this exercise __________ times a day. Hamstring, isometric 1. Lie on your back on a firm surface. 2. Bend your left / right knee about __________ degrees. 3. Dig your left / right heel into the surface as if you are trying to pull it toward your buttocks. Tighten the muscles in the back of your thighs (hamstring) to "dig" as hard as you can without increasing any pain. 4. Hold this position for __________ seconds. 5. Release the tension gradually and allow your muscles to relax completely for __________ seconds after each repetition. Repeat __________ times. Complete this exercise __________ times a day. Hamstring curls If told by your health care provider, do this exercise while wearing ankle weights. Begin with __________ lb weights. Then increase the weight by 1 lb (0.5 kg) increments. Do not wear ankle weights that are more than __________ lb. 1. Lie on your abdomen with your legs straight. 2. Bend your left / right knee as far as you can without feeling pain. Keep your hips flat against the floor. 3. Hold this position for __________ seconds. 4. Slowly lower your leg to the starting position. Repeat __________ times. Complete this exercise __________ times a day. Squats This exercise strengthens the muscles in front of your thigh and knee (quadriceps). 1. Stand in front of a table, with your feet and knees pointing straight ahead. You may rest your hands on the table for balance but not for support. 2. Slowly bend your knees and lower your hips like you are going to sit in a chair. ? Keep your weight over your heels, not over your toes. ? Keep your lower legs upright so they are parallel with the table legs. ? Do not let your hips go lower than your knees. ? Do not bend lower than told by your health care provider. ? If your knee pain increases, do not bend as low. 3. Hold the squat position for __________ seconds. 4. Slowly push with your  legs to return to standing. Do not use your hands to pull yourself to standing. Repeat __________ times. Complete this exercise __________ times a day. Wall slides This exercise strengthens the muscles in front of your thigh and knee (quadriceps). 1. Lean your back against a smooth wall or door, and walk your feet out 18-24 inches (46-61 cm) from it. 2. Place your feet hip-width apart. 3. Slowly slide down the wall or door until your knees bend __________ degrees. Keep your knees over your heels, not over your toes. Keep your knees in line with your hips. 4. Hold this position for __________ seconds. Repeat __________ times. Complete this exercise __________ times a day. Straight leg raises This exercise strengthens the  muscles that rotate the leg at the hip and move it away from your body (hip abductors). 1. Lie on your side with your left / right leg in the top position. Lie so your head, shoulder, knee, and hip line up. You may bend your bottom knee to help you keep your balance. 2. Roll your hips slightly forward so your hips are stacked directly over each other and your left / right knee is facing forward. 3. Leading with your heel, lift your top leg 4-6 inches (10-15 cm). You should feel the muscles in your outer hip lifting. ? Do not let your foot drift forward. ? Do not let your knee roll toward the ceiling. 4. Hold this position for __________ seconds. 5. Slowly return your leg to the starting position. 6. Let your muscles relax completely after each repetition. Repeat __________ times. Complete this exercise __________ times a day. Straight leg raises This exercise stretches the muscles that move your hips away from the front of the pelvis (hip extensors). 1. Lie on your abdomen on a firm surface. You can put a pillow under your hips if that is more comfortable. 2. Tense the muscles in your buttocks and lift your left / right leg about 4-6 inches (10-15 cm). Keep your knee straight  as you lift your leg. 3. Hold this position for __________ seconds. 4. Slowly lower your leg to the starting position. 5. Let your leg relax completely after each repetition. Repeat __________ times. Complete this exercise __________ times a day. This information is not intended to replace advice given to you by your health care provider. Make sure you discuss any questions you have with your health care provider. Document Revised: 03/28/2018 Document Reviewed: 03/28/2018 Elsevier Patient Education  2020 [employer].

## 2020-02-14 NOTE — Telephone Encounter (Signed)
Please apply for bilateral knee visco, per Taylor Dale, PA-C. Thanks!  

## 2020-02-15 LAB — COMPLETE METABOLIC PANEL WITH GFR
AG Ratio: 1.4 (calc) (ref 1.0–2.5)
ALT: 20 U/L (ref 6–29)
AST: 21 U/L (ref 10–35)
Albumin: 4 g/dL (ref 3.6–5.1)
Alkaline phosphatase (APISO): 71 U/L (ref 37–153)
BUN/Creatinine Ratio: 15 (calc) (ref 6–22)
BUN: 17 mg/dL (ref 7–25)
CO2: 28 mmol/L (ref 20–32)
Calcium: 10.1 mg/dL (ref 8.6–10.4)
Chloride: 104 mmol/L (ref 98–110)
Creat: 1.12 mg/dL — ABNORMAL HIGH (ref 0.50–0.99)
GFR, Est African American: 61 mL/min/{1.73_m2} (ref >=60)
GFR, Est Non African American: 52 mL/min/{1.73_m2} — ABNORMAL LOW (ref >=60)
Globulin: 2.9 g/dL (calc) (ref 1.9–3.7)
Glucose, Bld: 199 mg/dL — ABNORMAL HIGH (ref 65–99)
Potassium: 4 mmol/L (ref 3.5–5.3)
Sodium: 139 mmol/L (ref 135–146)
Total Bilirubin: 0.5 mg/dL (ref 0.2–1.2)
Total Protein: 6.9 g/dL (ref 6.1–8.1)

## 2020-02-15 LAB — CBC WITH DIFFERENTIAL/PLATELET
Absolute Monocytes: 578 cells/uL (ref 200–950)
Basophils Absolute: 82 cells/uL (ref 0–200)
Basophils Relative: 1.2 %
Eosinophils Absolute: 279 cells/uL (ref 15–500)
Eosinophils Relative: 4.1 %
HCT: 41.8 % (ref 35.0–45.0)
Hemoglobin: 13.8 g/dL (ref 11.7–15.5)
Lymphs Abs: 2448 cells/uL (ref 850–3900)
MCH: 29.9 pg (ref 27.0–33.0)
MCHC: 33 g/dL (ref 32.0–36.0)
MCV: 90.5 fL (ref 80.0–100.0)
MPV: 13 fL — ABNORMAL HIGH (ref 7.5–12.5)
Monocytes Relative: 8.5 %
Neutro Abs: 3414 cells/uL (ref 1500–7800)
Neutrophils Relative %: 50.2 %
Platelets: 261 10*3/uL (ref 140–400)
RBC: 4.62 10*6/uL (ref 3.80–5.10)
RDW: 13 % (ref 11.0–15.0)
Total Lymphocyte: 36 %
WBC: 6.8 10*3/uL (ref 3.8–10.8)

## 2020-02-15 NOTE — Progress Notes (Signed)
CBC WNL.  Creatinine borderline elevated-1.12.  GFR is low-52.  Please notify the patient and advise her to avoid taking NSAIDs.  She is prescribed lasix which may be contributing.

## 2020-02-19 ENCOUNTER — Telehealth: Payer: Self-pay | Admitting: Rheumatology

## 2020-02-19 DIAGNOSIS — Z1231 Encounter for screening mammogram for malignant neoplasm of breast: Secondary | ICD-10-CM | POA: Diagnosis not present

## 2020-02-19 NOTE — Telephone Encounter (Signed)
Becky [name] from [facility] left a voicemail stating Synvisc injections are not covered under Medicare Part D.  It can be submitted through her medical benefit plan Medicare Part B which will cover it without prior authorization.  Becky [name] states she left a message with the patient.  If you have any questions, please call back at #[phone] opt [phone]

## 2020-02-20 NOTE — Telephone Encounter (Signed)
Please call patient to schedule Visco knee injections.  Authorized for Synvisc series Bilateral Knees.  Buy and Annette Stable  Deductible does not apply. No PA required. Insurance to cover 100% of allowable cost for injection with a $40.00 co pay each visit, and 80% of allowable cost for medication. Pay to pay $40.00 co pay each visit, and 20% of allowable cost for medication post injections.

## 2020-02-22 ENCOUNTER — Encounter: Payer: Self-pay | Admitting: Obstetrics and Gynecology

## 2020-02-29 NOTE — Telephone Encounter (Signed)
I LMOM for patient to call to receive VOB, and schedule Synvisc injections.

## 2020-03-03 ENCOUNTER — Ambulatory Visit: Payer: Medicare Other | Admitting: Physical Medicine and Rehabilitation

## 2020-03-03 ENCOUNTER — Other Ambulatory Visit: Payer: Self-pay

## 2020-03-03 ENCOUNTER — Encounter: Payer: Self-pay | Admitting: Physical Medicine and Rehabilitation

## 2020-03-03 ENCOUNTER — Ambulatory Visit: Payer: Self-pay

## 2020-03-03 VITALS — BP 146/66 | HR 81

## 2020-03-03 DIAGNOSIS — G8929 Other chronic pain: Secondary | ICD-10-CM

## 2020-03-03 DIAGNOSIS — M545 Low back pain, unspecified: Secondary | ICD-10-CM

## 2020-03-03 DIAGNOSIS — M47816 Spondylosis without myelopathy or radiculopathy, lumbar region: Secondary | ICD-10-CM

## 2020-03-03 MED ORDER — METHYLPREDNISOLONE ACETATE 80 MG/ML IJ SUSP
80.0000 mg | Freq: Once | INTRAMUSCULAR | Status: AC
  Administered 2020-03-03: 80 mg

## 2020-03-03 MED ORDER — BUPIVACAINE HCL 0.5 % IJ SOLN
3.0000 mL | Freq: Once | INTRAMUSCULAR | Status: AC
  Administered 2020-03-03: 3 mL

## 2020-03-03 NOTE — Progress Notes (Signed)
Becky [name] - 64 y.o. female MRN [id number]  Date of birth: 18-Nov-1955  Office Visit Note: Visit Date: 03/03/2020 PCP: Adrian Prince, MD Referred by: Adrian Prince, MD  Subjective: Chief Complaint  Patient presents with  . Lower Back - Pain   HPI:  Becky [name] is a 64 y.o. female who comes in today for planned repeat Right L1-L2 Lumbar facet/medial branch block with fluoroscopic guidance.  The patient has failed conservative care including home exercise, medications, time and activity modification.  This injection will be diagnostic and hopefully therapeutic.  Please see requesting physician notes for further details and justification.  Exam shows concordant low back pain with facet joint loading and extension. Patient received more than 80% pain relief from prior injection.  **if counting the first non-rib bearing vertebra as L1 then there is foraminal narrowing between L2-3. MRI does not call out transitional segment.  Consider right L2 transforaminal epidural steroid injection depending on relief.   Referring:Dr. Vira Browns   ROS Otherwise per HPI.  Assessment & Plan: Visit Diagnoses:  1. Spondylosis without myelopathy or radiculopathy, lumbar region   2. Chronic right-sided low back pain without sciatica     Plan: No additional findings.   Meds & Orders:  Meds ordered this encounter  Medications  . bupivacaine (MARCAINE) 0.5 % (with pres) injection 3 mL  . methylPREDNISolone acetate (DEPO-MEDROL) injection 80 mg    Orders Placed This Encounter  Procedures  . Facet Injection  . XR C-ARM NO REPORT    Follow-up: Return for Review Pain Diary.   Procedures: No procedures performed  Lumbar Diagnostic Facet Joint Nerve Block with Fluoroscopic Guidance   [name]      Date of Birth: 06-Oct-1955 MRN: [id number] PCP: Adrian Prince, MD      Visit Date: 03/03/2020   Universal Protocol:    Date/Time: 09/13/219:53 AM  Consent Given By: the  patient  Position: PRONE  Additional Comments: Vital signs were monitored before and after the procedure. Patient was prepped and draped in the usual sterile fashion. The correct patient, procedure, and site was verified.   Injection Procedure Details:  Procedure Site One Meds Administered:  Meds ordered this encounter  Medications  . bupivacaine (MARCAINE) 0.5 % (with pres) injection 3 mL  . methylPREDNISolone acetate (DEPO-MEDROL) injection 80 mg     Laterality: Right  Location/Site:  L1-L2  Needle size: 22 ga.  Needle type:spinal  Needle Placement: Oblique pedical  Findings:   -Comments: There was excellent flow of contrast along the articular pillars without intravascular flow.  Procedure Details: The fluoroscope beam is vertically oriented in AP and then obliqued 15 to 20 degrees to the ipsilateral side of the desired nerve to achieve the "Scotty dog" appearance.  The skin over the target area of the junction of the superior articulating process and the transverse process (sacral ala if blocking the L5 dorsal rami) was locally anesthetized with a 1 ml volume of 1% Lidocaine without Epinephrine.  The spinal needle was inserted and advanced in a trajectory view down to the target.   After contact with periosteum and negative aspirate for blood and CSF, correct placement without intravascular or epidural spread was confirmed by injecting 0.5 ml. of Isovue-250.  A spot radiograph was obtained of this image.    Next, a 0.5 ml. volume of the injectate described above was injected. The needle was then redirected to the other facet joint nerves mentioned above if needed.  Prior to  the procedure, the patient was given a Pain Diary which was completed for baseline measurements.  After the procedure, the patient rated their pain every 30 minutes and will continue rating at this frequency for a total of 5 hours.  The patient has been asked to complete the Diary and return to [place] by  mail, fax or hand delivered as soon as possible.   Additional Comments:  The patient tolerated the procedure well Dressing: 2 x 2 sterile gauze and Band-Aid    Post-procedure details: Patient was observed during the procedure. Post-procedure instructions were reviewed.  Patient left the clinic in stable condition.    Clinical History: Chronic low back pain and radiculopathy, worsening  EXAM: MRI LUMBAR SPINE WITHOUT CONTRAST  TECHNIQUE: Multiplanar, multisequence MR imaging of the lumbar spine was performed. No intravenous contrast was administered.  COMPARISON:  None.  FINDINGS: Segmentation:  Standard.  Alignment:  No significant anteroposterior listhesis.  Vertebrae: Vertebral body heights are maintained apart from degenerative endplate irregularity primarily at L1-L2. There are chronic appearing degenerative endplate marrow changes at this level.  Conus medullaris and cauda equina: Conus extends to the L1-L2 level. Conus and cauda equina appear normal.  Paraspinal and other soft tissues: Unremarkable.  Disc levels:  L1-L2: Disc bulge with endplate osteophytic ridging eccentric to the right. No canal stenosis. Mild narrowing of the right lateral recess. Moderate right foraminal stenosis. No left foraminal stenosis.  L2-L3:  Minimal disc bulge.  No canal or foraminal stenosis.  L3-L4: Minimal disc bulge. Mild facet arthropathy with ligamentum flavum infolding. No canal or foraminal stenosis.  L4-L5: Minimal disc bulge. Mild facet arthropathy with ligamentum flavum infolding. No canal or foraminal stenosis.  L5-S1: Disc bulge with endplate osteophytic ridging eccentric to the left. Mild facet arthropathy. No significant canal stenosis. Minor right and mild left foraminal stenosis.  IMPRESSION: Overall mild degenerative changes as detailed above, greatest at L1-L2.   Electronically Signed   By: Guadlupe Spanish M.D.   On: 01/07/2020  09:50     Objective:  VS:  HT:    WT:   BMI:     BP:(!) 146/66  HR:81bpm  TEMP: ( )  RESP:  Physical Exam Constitutional:      General: She is not in acute distress.    Appearance: Normal appearance. She is not ill-appearing.  HENT:     Head: Normocephalic and atraumatic.     Right Ear: External ear normal.     Left Ear: External ear normal.  Eyes:     Extraocular Movements: Extraocular movements intact.  Cardiovascular:     Rate and Rhythm: Normal rate.     Pulses: Normal pulses.  Musculoskeletal:     Right lower leg: No edema.     Left lower leg: No edema.     Comments: Patient has good distal strength with no pain over the greater trochanters.  No clonus or focal weakness. Patient somewhat slow to rise from a seated position to full extension.  There is concordant low back pain with facet loading and lumbar spine extension rotation.  There are no definitive trigger points but the patient is somewhat tender across the lower back and PSIS.  There is no pain with hip rotation.   Skin:    Findings: No erythema, lesion or rash.  Neurological:     General: No focal deficit present.     Mental Status: She is alert and oriented to person, place, and time.     Sensory: No sensory  deficit.     Motor: No weakness or abnormal muscle tone.     Coordination: Coordination normal.  Psychiatric:        Mood and Affect: Mood normal.        Behavior: Behavior normal.      Imaging: XR C-ARM NO REPORT  Result Date: 03/03/2020 Please see Notes tab for imaging impression.

## 2020-03-03 NOTE — Progress Notes (Signed)
Pt state lower back pain. Pt state when she wakes up in the morning the pain is very bad. Pt states blending over, stand and sitting always makes the pain worse.  Numeric Pain Rating Scale and Functional Assessment Average Pain 8   In the last MONTH (on 0-10 scale) has pain interfered with the following?  1. General activity like being  able to carry out your everyday physical activities such as walking, climbing stairs, carrying groceries, or moving a chair?  Rating(10)   +Driver, -BT, -Dye Allergies.

## 2020-03-03 NOTE — Procedures (Signed)
Lumbar Diagnostic Facet Joint Nerve Block with Fluoroscopic Guidance   [name]      Date of Birth: Sep 11, 1955 MRN: [id number] PCP: Adrian Prince, MD      Visit Date: 03/03/2020   Universal Protocol:    Date/Time: 09/13/219:53 AM  Consent Given By: the patient  Position: PRONE  Additional Comments: Vital signs were monitored before and after the procedure. Patient was prepped and draped in the usual sterile fashion. The correct patient, procedure, and site was verified.   Injection Procedure Details:  Procedure Site One Meds Administered:  Meds ordered this encounter  Medications  . bupivacaine (MARCAINE) 0.5 % (with pres) injection 3 mL  . methylPREDNISolone acetate (DEPO-MEDROL) injection 80 mg     Laterality: Right  Location/Site:  L1-L2  Needle size: 22 ga.  Needle type:spinal  Needle Placement: Oblique pedical  Findings:   -Comments: There was excellent flow of contrast along the articular pillars without intravascular flow.  Procedure Details: The fluoroscope beam is vertically oriented in AP and then obliqued 15 to 20 degrees to the ipsilateral side of the desired nerve to achieve the "Scotty dog" appearance.  The skin over the target area of the junction of the superior articulating process and the transverse process (sacral ala if blocking the L5 dorsal rami) was locally anesthetized with a 1 ml volume of 1% Lidocaine without Epinephrine.  The spinal needle was inserted and advanced in a trajectory view down to the target.   After contact with periosteum and negative aspirate for blood and CSF, correct placement without intravascular or epidural spread was confirmed by injecting 0.5 ml. of Isovue-250.  A spot radiograph was obtained of this image.    Next, a 0.5 ml. volume of the injectate described above was injected. The needle was then redirected to the other facet joint nerves mentioned above if needed.  Prior to the procedure, the  patient was given a Pain Diary which was completed for baseline measurements.  After the procedure, the patient rated their pain every 30 minutes and will continue rating at this frequency for a total of 5 hours.  The patient has been asked to complete the Diary and return to [place] by mail, fax or hand delivered as soon as possible.   Additional Comments:  The patient tolerated the procedure well Dressing: 2 x 2 sterile gauze and Band-Aid    Post-procedure details: Patient was observed during the procedure. Post-procedure instructions were reviewed.  Patient left the clinic in stable condition.

## 2020-03-04 ENCOUNTER — Ambulatory Visit: Payer: Medicare Other | Admitting: Obstetrics and Gynecology

## 2020-03-05 ENCOUNTER — Encounter (INDEPENDENT_AMBULATORY_CARE_PROVIDER_SITE_OTHER): Payer: Medicare Other | Admitting: Ophthalmology

## 2020-03-10 ENCOUNTER — Other Ambulatory Visit: Payer: Self-pay

## 2020-03-10 ENCOUNTER — Ambulatory Visit: Payer: Medicare Other | Admitting: Physician Assistant

## 2020-03-10 DIAGNOSIS — M1712 Unilateral primary osteoarthritis, left knee: Secondary | ICD-10-CM

## 2020-03-10 DIAGNOSIS — M170 Bilateral primary osteoarthritis of knee: Secondary | ICD-10-CM | POA: Diagnosis not present

## 2020-03-10 DIAGNOSIS — M1711 Unilateral primary osteoarthritis, right knee: Secondary | ICD-10-CM

## 2020-03-10 MED ORDER — LIDOCAINE HCL 1 % IJ SOLN
1.5000 mL | INTRAMUSCULAR | Status: AC | PRN
  Administered 2020-03-10: 1.5 mL

## 2020-03-10 MED ORDER — HYLAN G-F 20 16 MG/2ML IX SOSY
16.0000 mg | PREFILLED_SYRINGE | INTRA_ARTICULAR | Status: AC | PRN
Start: 2020-03-10 — End: 2020-03-10
  Administered 2020-03-10: 16 mg via INTRA_ARTICULAR

## 2020-03-10 MED ORDER — HYLAN G-F 20 16 MG/2ML IX SOSY
16.0000 mg | PREFILLED_SYRINGE | INTRA_ARTICULAR | Status: AC | PRN
  Administered 2020-03-10: 16 mg via INTRA_ARTICULAR

## 2020-03-10 NOTE — Progress Notes (Signed)
   Procedure Note  [name]             Date of Birth: [birth year]           MRN: [id number]             Visit Date: 03/10/2020  Procedures: Visit Diagnoses:  1. Primary osteoarthritis of both knees    Synvisc #1 bilateral knee joint injections  Large Joint Inj: bilateral knee on 03/10/2020 8:19 AM Indications: pain Details: 25 G 1.5 in needle, medial approach  Arthrogram: No  Medications (Right): 1.5 mL lidocaine 1 %; 16 mg Hylan 16 MG/2ML Aspirate (Right): 0 mL Medications (Left): 1.5 mL lidocaine 1 %; 16 mg Hylan 16 MG/2ML Aspirate (Left): 0 mL Outcome: tolerated well, no immediate complications Procedure, treatment alternatives, risks and benefits explained, specific risks discussed. Consent was given by the patient. Immediately prior to procedure a time out was called to verify the correct patient, procedure, equipment, support staff and site/side marked as required. Patient was prepped and draped in the usual sterile fashion.     Patient tolerated the procedure well.  Aftercare was discussed.  Sherron Ales, PA-C

## 2020-03-17 ENCOUNTER — Ambulatory Visit: Payer: Medicare Other | Admitting: Physician Assistant

## 2020-03-17 ENCOUNTER — Other Ambulatory Visit: Payer: Self-pay

## 2020-03-17 DIAGNOSIS — G518 Other disorders of facial nerve: Secondary | ICD-10-CM | POA: Diagnosis not present

## 2020-03-17 DIAGNOSIS — M170 Bilateral primary osteoarthritis of knee: Secondary | ICD-10-CM

## 2020-03-17 DIAGNOSIS — M791 Myalgia, unspecified site: Secondary | ICD-10-CM | POA: Diagnosis not present

## 2020-03-17 DIAGNOSIS — G43719 Chronic migraine without aura, intractable, without status migrainosus: Secondary | ICD-10-CM | POA: Diagnosis not present

## 2020-03-17 DIAGNOSIS — M542 Cervicalgia: Secondary | ICD-10-CM | POA: Diagnosis not present

## 2020-03-17 MED ORDER — LIDOCAINE HCL 1 % IJ SOLN
1.5000 mL | INTRAMUSCULAR | Status: AC | PRN
  Administered 2020-03-17: 1.5 mL

## 2020-03-17 MED ORDER — HYLAN G-F 20 16 MG/2ML IX SOSY
16.0000 mg | PREFILLED_SYRINGE | INTRA_ARTICULAR | Status: AC | PRN
  Administered 2020-03-17: 16 mg via INTRA_ARTICULAR

## 2020-03-17 NOTE — Progress Notes (Signed)
   Procedure Note  Patient: ROSELIN WIEMANN             Date of Birth: [birth year]           MRN: [id number]             Visit Date: 03/17/2020  Procedures: Visit Diagnoses:  1. Primary osteoarthritis of both knees     Synvisc #2 bilateral B/B  Large Joint Inj: bilateral knee on 03/17/2020 10:37 AM Indications: pain Details: 25 G 1.5 in needle, medial approach  Arthrogram: No  Medications (Right): 1.5 mL lidocaine 1 %; 16 mg Hylan 16 MG/2ML Aspirate (Right): 0 mL Medications (Left): 1.5 mL lidocaine 1 %; 16 mg Hylan 16 MG/2ML Aspirate (Left): 0 mL Outcome: tolerated well, no immediate complications Procedure, treatment alternatives, risks and benefits explained, specific risks discussed. Consent was given by the patient. Immediately prior to procedure a time out was called to verify the correct patient, procedure, equipment, support staff and site/side marked as required. Patient was prepped and draped in the usual sterile fashion.     Patient tolerated the procedure well.  Aftercare was discussed.  Sherron Ales, PA-C

## 2020-03-18 ENCOUNTER — Other Ambulatory Visit: Payer: Self-pay

## 2020-03-18 ENCOUNTER — Encounter (INDEPENDENT_AMBULATORY_CARE_PROVIDER_SITE_OTHER): Payer: Self-pay | Admitting: Ophthalmology

## 2020-03-18 ENCOUNTER — Ambulatory Visit (INDEPENDENT_AMBULATORY_CARE_PROVIDER_SITE_OTHER): Payer: Medicare Other | Admitting: Ophthalmology

## 2020-03-18 DIAGNOSIS — E113551 Type 2 diabetes mellitus with stable proliferative diabetic retinopathy, right eye: Secondary | ICD-10-CM

## 2020-03-18 DIAGNOSIS — E103551 Type 1 diabetes mellitus with stable proliferative diabetic retinopathy, right eye: Secondary | ICD-10-CM

## 2020-03-18 DIAGNOSIS — E103552 Type 1 diabetes mellitus with stable proliferative diabetic retinopathy, left eye: Secondary | ICD-10-CM | POA: Diagnosis not present

## 2020-03-18 DIAGNOSIS — H43812 Vitreous degeneration, left eye: Secondary | ICD-10-CM

## 2020-03-18 DIAGNOSIS — H43811 Vitreous degeneration, right eye: Secondary | ICD-10-CM | POA: Diagnosis not present

## 2020-03-18 DIAGNOSIS — E103512 Type 1 diabetes mellitus with proliferative diabetic retinopathy with macular edema, left eye: Secondary | ICD-10-CM

## 2020-03-18 DIAGNOSIS — Z9889 Other specified postprocedural states: Secondary | ICD-10-CM

## 2020-03-18 NOTE — Assessment & Plan Note (Signed)

## 2020-03-18 NOTE — Progress Notes (Signed)
03/18/2020     CHIEF COMPLAINT Patient presents for Retina Follow Up   HISTORY OF PRESENT ILLNESS: Becky [name] is a 64 y.o. female who presents to the clinic today for:   HPI    Retina Follow Up    Patient presents with  Diabetic Retinopathy.  In both eyes.  This started 1 year ago.  Severity is mild.  Duration of 1 year.  Since onset it is stable.          Comments    1 Year Diabetic F/U OU  Pt denies noticeable changes to [place] OU since last visit. Pt denies ocular pain, flashes of light, or floaters OU.  A1c: 7.5, 01/2020 LBS: 298 this AM (has been running high due to steroid injections)       Last edited by Rockie Neighbours, [place] on 03/18/2020  1:08 PM. (History)      Referring physician: Reynold Bowen, MD [place],  [place] [address]  HISTORICAL INFORMATION:   Selected notes from the [facility]: No current outpatient medications on file. (Ophthalmic Drugs)   No current facility-administered medications for this visit. (Ophthalmic Drugs)   Current Outpatient Medications (Other)  Medication Sig   aspirin 81 MG tablet Take 81 mg by mouth daily.   calcium carbonate (OS-CAL) 600 MG TABS Take 600 mg by mouth 2 (two) times daily with a meal.    CONTOUR NEXT TEST test strip    cyclobenzaprine (FLEXERIL) 10 MG tablet Take 10 mg by mouth 3 (three) times daily as needed for muscle spasms. Reported on 07/03/2015   DULoxetine (CYMBALTA) 30 MG capsule Take by mouth.   DULoxetine (CYMBALTA) 60 MG capsule Take 60 mg by mouth daily.    etanercept (ENBREL SURECLICK) 50 MG/ML injection Inject 0.98 mLs (50 mg total) into the skin once a week. (Patient taking differently: Inject 50 mg into the skin every 14 (fourteen) days. )   furosemide (LASIX) 20 MG tablet Take 10 mg by mouth daily.    gabapentin (NEURONTIN) 100 MG capsule Take 1 capsule (100 mg total) by mouth at bedtime.   insulin aspart (NOVOLOG) 100 UNIT/ML  injection Insulin pump   losartan (COZAAR) 25 MG tablet Take 25 mg by mouth daily.    Multiple Vitamin (MULTIVITAMIN) tablet Take 1 tablet by mouth daily.   polyethylene glycol (MIRALAX / GLYCOLAX) 17 g packet Take 17 g by mouth 2 (two) times daily.    pravastatin (PRAVACHOL) 20 MG tablet Take 20 mg by mouth daily.    SUMAtriptan (IMITREX) 100 MG tablet Take 100 mg by mouth every 2 (two) hours as needed.   topiramate (TOPAMAX) 200 MG tablet Take 200 mg by mouth daily.    Tuberculin-Allergy Syringes 27G X 1/2" 1 ML KIT To be used with weekly methotrexate injections once weekly.   vitamin B-12 (CYANOCOBALAMIN) 1000 MCG tablet Take 1,000 mcg by mouth daily.   No current facility-administered medications for this visit. (Other)      REVIEW OF SYSTEMS:    ALLERGIES Allergies  Allergen Reactions   Chlorhexidine Rash    Chloroprep   Macrobid [Nitrofurantoin Macrocrystal] Hives   Ciprofloxacin Rash   Penicillins Rash    PAST MEDICAL HISTORY Past Medical History:  Diagnosis Date   Abnormal ECG    Anemia    Anxiety    Cataract    . Cateracts bil eyes removed   GERD (gastroesophageal reflux disease)  Heart murmur    Hypercholesterolemia    IDDM (insulin dependent diabetes mellitus) 1969   Migraine headache    Neuromuscular disorder (HCC)    bil legs & feet - neuropathy   Neuropathy    Osteoporosis    10/2012   Otosclerosis    RA (rheumatoid arthritis) ([place]) 2000   S/P cardiac cath August 2013   Normal coronaries and normal LV function.   Sjoegren syndrome    Past Surgical History:  Procedure Laterality Date   BREAST LUMPECTOMY  1995   CARPAL TUNNEL RELEASE  2000   CATARACT EXTRACTION Right 09-05-12   CATARACT EXTRACTION Left 3/15   CESAREAN SECTION  1987   COLONOSCOPY     EYE SURGERY     diabetic retinopathy    HYSTEROSCOPY  02/2001   and D & C   LYMPH NODE DISSECTION  2002   TOE SURGERY  2000   bone removed    FAMILY  HISTORY Family History  Problem Relation Age of Onset   Breast cancer Mother 33   Colon polyps Mother        unspecified number   Uterine cancer Mother        metastatic   Breast cancer Maternal Grandmother 24   Stroke Father 28   Rheum arthritis Sister    Breast cancer Sister 66       lump; (-)GT (breast/ova ca panel, [facility])   Skin cancer Maternal Grandfather        unspecified type   Prostate cancer Maternal Grandfather        dx. late 18s   Breast cancer Other        dx. in their late 60s   Breast cancer Other        dx. 78-79   Breast cancer Cousin        bilateral; dx. late 87s   Colon cancer Neg Hx    Esophageal cancer Neg Hx    Rectal cancer Neg Hx    Stomach cancer Neg Hx     SOCIAL HISTORY Social History   Tobacco Use   Smoking status: Never Smoker   Smokeless tobacco: Never Used  Vaping Use   Vaping Use: Never used  Substance Use Topics   Alcohol use: Yes    Alcohol/week: 4.0 standard drinks    Types: 2 Shots of liquor, 2 Standard drinks or equivalent per week    Comment: maybe 1-2 cocktails per wk   Drug use: No         OPHTHALMIC EXAM:  Base Eye Exam    Visual Acuity (ETDRS)      Right Left   Dist [place] 20/30 +1 20/30 +1   Dist ph [place] 20/20 -1 20/25       Tonometry (Tonopen, 1:10 PM)      Right Left   Pressure 15 12       Pupils      Pupils Dark Light Shape React APD   Right PERRL 3 2 Round Brisk None   Left PERRL 3 2 Round Brisk None       Visual Fields (Counting fingers)      Left Right    Full Full       Extraocular Movement      Right Left    Full Full       Neuro/Psych    Oriented x3: Yes   Mood/Affect: Normal       Dilation    Both eyes: 1.0% Mydriacyl,  2.5% Phenylephrine @ 1:13 PM        Slit Lamp and Fundus Exam    External Exam      Right Left   External Normal Normal       Slit Lamp Exam      Right Left   Lids/Lashes Normal Normal   Conjunctiva/Sclera White and quiet White and  quiet   Cornea Clear Clear   Anterior Chamber Deep and quiet Deep and quiet   Iris Round and reactive Round and reactive   Lens Posterior chamber intraocular lens Posterior chamber intraocular lens   Anterior Vitreous Normal Normal       Fundus Exam      Right Left   Posterior Vitreous Clear media,, Posterior vitreous detachment Clear media,, Posterior vitreous detachment   C/D Ratio 0.45 0.55   Macula Microaneurysms, no clinically significant macular edema    Vessels PDR-quiet PDR-quiet   Periphery Good PRP 360, a attached, clear media Good PRP 360, a attached, clear media          IMAGING AND PROCEDURES  Imaging and Procedures for 03/18/20  OCT, Retina - OU - Both Eyes       Right Eye Quality was good. Scan locations included subfoveal. Central Foveal Thickness: 286. Progression has been stable. Findings include normal foveal contour.   Left Eye Quality was good. Scan locations included subfoveal. Central Foveal Thickness: 286. Progression has been stable. Findings include normal foveal contour.   Notes Bilateral vitreous detachment with normal foveal contour                ASSESSMENT/PLAN:  Stable treated proliferative diabetic retinopathy of right eye determined by examination associated with type 2 diabetes mellitus ([place]) The nature of regressed proliferative diabetic retinopathy was discussed with the patient. The patient was advised to maintain good glucose, blood pressure, monitor kidney function and serum lipid control as advised by personal physician. Rare risk for reactivation of progression exist with untreated severe anemia, untreated renal failure, untreated heart failure, and smoking. Complete avoidance of smoking was recommended. The chance of recurrent proliferative diabetic retinopathy was discussed as well as the chance of vitreous hemorrhage for which further treatments may be necessary.   Explained to the patient that the quiescent  proliferative  diabetic retinopathy disease is unlikely to ever worsen.  Worsening factors would include however severe anemia, hypertension out-of-control or impending renal failure.      ICD-10-CM   1. Stable treated proliferative diabetic retinopathy of right eye determined by examination associated with type 2 diabetes mellitus ([place])  E11.3551 OCT, Retina - OU - Both Eyes  2. 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 determined by examination associated with type 1 diabetes mellitus (HCC)  E10.3552 OCT, Retina - OU - Both Eyes   E10.3512   3. Posterior vitreous detachment of right eye  H43.811 OCT, Retina - OU - Both Eyes  4. Posterior vitreous detachment of left eye  H43.812 OCT, Retina - OU - Both Eyes  5. History of vitrectomy  Z98.890     1.  Bilateral quiescent proliferative diabetic retinopathy from type 1 diabetes, OU quiescent.  2.  Observe OU.  3.  History of vitrectomy OD for severe asteroid hyalosis, at which time the severe vitreal macular adhesion could not be released, thus core vitrectomy performed at that time with great success and improvement in visual acuity and symptoms and ability to monitor the retinopathy  Ophthalmic Meds Ordered this visit:  No orders of the defined types were placed in this encounter.      Return in about 9 months (around 12/16/2020) for COLOR FP, DILATE OU.  There are no Patient Instructions on file for this visit.   Explained the diagnoses, plan, and follow up with the patient and they expressed understanding.  Patient expressed understanding of the importance of proper follow up care.   Clent Demark Deitrich Steve M.D. Diseases & Surgery of the Retina and Vitreous [facility] [place] 03/18/20     Abbreviations: M myopia (nearsighted); A astigmatism; H hyperopia (farsighted); P presbyopia; Mrx spectacle prescription;  CTL contact lenses; OD right eye; OS left eye; OU both eyes  XT exotropia; ET esotropia; PEK  punctate epithelial keratitis; PEE punctate epithelial erosions; DES dry eye syndrome; MGD meibomian gland dysfunction; ATs artificial tears; PFAT's preservative free artificial tears; [place] nuclear sclerotic cataract; PSC posterior subcapsular cataract; ERM epi-retinal membrane; PVD posterior vitreous detachment; RD retinal detachment; DM diabetes mellitus; DR diabetic retinopathy; NPDR non-proliferative diabetic retinopathy; PDR proliferative diabetic retinopathy; CSME clinically significant macular edema; DME diabetic macular edema; dbh dot blot hemorrhages; CWS cotton wool spot; POAG primary open angle glaucoma; C/D cup-to-disc ratio; HVF humphrey visual field; GVF goldmann visual field; OCT optical coherence tomography; IOP intraocular pressure; BRVO Branch retinal vein occlusion; CRVO central retinal vein occlusion; CRAO central retinal artery occlusion; BRAO branch retinal artery occlusion; RT retinal tear; SB scleral buckle; PPV pars plana vitrectomy; VH Vitreous hemorrhage; PRP panretinal laser photocoagulation; IVK intravitreal kenalog; VMT vitreomacular traction; MH Macular hole;  NVD neovascularization of the disc; NVE neovascularization elsewhere; AREDS age related eye disease study; ARMD age related macular degeneration; POAG primary open angle glaucoma; EBMD epithelial/anterior basement membrane dystrophy; ACIOL anterior chamber intraocular lens; IOL intraocular lens; PCIOL posterior chamber intraocular lens; Phaco/IOL phacoemulsification with intraocular lens placement; [place] photorefractive keratectomy; LASIK laser assisted in situ keratomileusis; HTN hypertension; DM diabetes mellitus; COPD chronic obstructive pulmonary disease

## 2020-03-19 DIAGNOSIS — L814 Other melanin hyperpigmentation: Secondary | ICD-10-CM | POA: Diagnosis not present

## 2020-03-19 DIAGNOSIS — L821 Other seborrheic keratosis: Secondary | ICD-10-CM | POA: Diagnosis not present

## 2020-03-19 DIAGNOSIS — L578 Other skin changes due to chronic exposure to nonionizing radiation: Secondary | ICD-10-CM | POA: Diagnosis not present

## 2020-03-19 DIAGNOSIS — L570 Actinic keratosis: Secondary | ICD-10-CM | POA: Diagnosis not present

## 2020-03-21 HISTORY — PX: OTHER SURGICAL HISTORY: SHX169

## 2020-03-24 ENCOUNTER — Ambulatory Visit: Payer: Self-pay

## 2020-03-24 ENCOUNTER — Other Ambulatory Visit: Payer: Self-pay

## 2020-03-24 ENCOUNTER — Ambulatory Visit: Payer: Medicare Other | Admitting: Physician Assistant

## 2020-03-24 ENCOUNTER — Ambulatory Visit (INDEPENDENT_AMBULATORY_CARE_PROVIDER_SITE_OTHER): Payer: Medicare Other | Admitting: Physician Assistant

## 2020-03-24 DIAGNOSIS — M542 Cervicalgia: Secondary | ICD-10-CM | POA: Diagnosis not present

## 2020-03-24 DIAGNOSIS — M170 Bilateral primary osteoarthritis of knee: Secondary | ICD-10-CM | POA: Diagnosis not present

## 2020-03-24 MED ORDER — LIDOCAINE HCL 1 % IJ SOLN
1.5000 mL | INTRAMUSCULAR | Status: AC | PRN
  Administered 2020-03-24: 1.5 mL

## 2020-03-24 MED ORDER — HYLAN G-F 20 16 MG/2ML IX SOSY
16.0000 mg | PREFILLED_SYRINGE | INTRA_ARTICULAR | Status: AC | PRN
Start: 2020-03-24 — End: 2020-03-24
  Administered 2020-03-24: 16 mg via INTRA_ARTICULAR

## 2020-03-24 NOTE — Progress Notes (Signed)
   Procedure Note  [name]             Date of Birth: Jun 28, 1955           MRN: [id number]             Visit Date: 03/24/2020  Procedures: Visit Diagnoses:  1. Primary osteoarthritis of both knees   2. Neck pain     synvisc #3 bilateral knees, B/B Large Joint Inj: bilateral knee on 03/24/2020 1:43 PM Indications: pain Details: 25 G 1.5 in needle, medial approach  Arthrogram: No  Medications (Right): 1.5 mL lidocaine 1 %; 16 mg Hylan 16 MG/2ML Aspirate (Right): 0 mL Medications (Left): 1.5 mL lidocaine 1 %; 16 mg Hylan 16 MG/2ML Aspirate (Left): 0 mL Outcome: tolerated well, no immediate complications Procedure, treatment alternatives, risks and benefits explained, specific risks discussed. Consent was given by the patient. Immediately prior to procedure a time out was called to verify the correct patient, procedure, equipment, support staff and site/side marked as required. Patient was prepped and draped in the usual sterile fashion.     She presents today with ongoing discomfort in her neck.  She has noticed increased stiffness and muscle tension.  She has been experiencing nocturnal pain causing interrupted sleep at night.  She has also been experiencing more frequent and severe migraines. She has limited ROM with lateral rotation. X-rays of the C-spine were obtained today.  She has multilevel spondylosis, narrowing most severe C5-C6.   She would like to proceed with MRI of the C-spine.   Patient tolerated the procedure well.  Aftercare was discussed. Sherron Ales, PA-C

## 2020-03-25 ENCOUNTER — Other Ambulatory Visit: Payer: Self-pay

## 2020-03-25 DIAGNOSIS — M542 Cervicalgia: Secondary | ICD-10-CM

## 2020-03-25 DIAGNOSIS — M5030 Other cervical disc degeneration, unspecified cervical region: Secondary | ICD-10-CM

## 2020-03-26 DIAGNOSIS — Z794 Long term (current) use of insulin: Secondary | ICD-10-CM | POA: Diagnosis not present

## 2020-03-26 DIAGNOSIS — E109 Type 1 diabetes mellitus without complications: Secondary | ICD-10-CM | POA: Diagnosis not present

## 2020-04-07 DIAGNOSIS — M542 Cervicalgia: Secondary | ICD-10-CM | POA: Diagnosis not present

## 2020-04-11 ENCOUNTER — Telehealth: Payer: Self-pay | Admitting: *Deleted

## 2020-04-11 NOTE — Telephone Encounter (Signed)
Recieved results for cervical MRI and was reviewed by Sherron Ales, PA-C   Degenerative changes have progressed since 2012. Mild Canal stenosis. Please advised the patient to follow up with Dr. Otelia Sergeant.  Advised patient of results and recommendations. Patient expressed understanding.

## 2020-04-15 ENCOUNTER — Ambulatory Visit: Payer: Medicare Other | Admitting: Physical Medicine and Rehabilitation

## 2020-04-15 ENCOUNTER — Encounter: Payer: Self-pay | Admitting: Physical Medicine and Rehabilitation

## 2020-04-15 ENCOUNTER — Ambulatory Visit: Payer: Self-pay

## 2020-04-15 ENCOUNTER — Other Ambulatory Visit: Payer: Self-pay

## 2020-04-15 VITALS — BP 139/69 | HR 85

## 2020-04-15 DIAGNOSIS — M47816 Spondylosis without myelopathy or radiculopathy, lumbar region: Secondary | ICD-10-CM

## 2020-04-15 MED ORDER — METHYLPREDNISOLONE ACETATE 80 MG/ML IJ SUSP
40.0000 mg | Freq: Once | INTRAMUSCULAR | Status: AC
  Administered 2020-04-15: 40 mg

## 2020-04-15 NOTE — Progress Notes (Signed)
Pt state lower back pain that travels to her left side. Pt state getting up out of bed in morning she feels pain. Pt state bending and standing for a long period of time makes the pain worse. Pt state she uses a heating pad to helps ease the pain.  Numeric Pain Rating Scale and Functional Assessment Average Pain 8   In the last MONTH (on 0-10 scale) has pain interfered with the following?  1. General activity like being  able to carry out your everyday physical activities such as walking, climbing stairs, carrying groceries, or moving a chair?  Rating(9)   +Driver, -BT, -Dye Allergies.

## 2020-04-21 ENCOUNTER — Telehealth: Payer: Self-pay | Admitting: Rheumatology

## 2020-04-21 ENCOUNTER — Other Ambulatory Visit: Payer: Self-pay | Admitting: Rheumatology

## 2020-04-21 ENCOUNTER — Other Ambulatory Visit: Payer: Self-pay | Admitting: *Deleted

## 2020-04-21 DIAGNOSIS — M5030 Other cervical disc degeneration, unspecified cervical region: Secondary | ICD-10-CM

## 2020-04-21 DIAGNOSIS — M542 Cervicalgia: Secondary | ICD-10-CM

## 2020-04-21 DIAGNOSIS — Z79899 Other long term (current) drug therapy: Secondary | ICD-10-CM

## 2020-04-21 MED ORDER — ENBREL SURECLICK 50 MG/ML ~~LOC~~ SOAJ: SUBCUTANEOUS | 0 refills | Status: DC

## 2020-04-21 NOTE — Telephone Encounter (Signed)
I called patient, referral placed 

## 2020-04-21 NOTE — Telephone Encounter (Signed)
Last Visit: 02/14/2020 Next Visit: 07/17/2020 Labs: 02/14/2020 CBC WNL. Creatinine borderline elevated-1.12. GFR is low-52.  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

## 2020-04-21 NOTE — Telephone Encounter (Signed)
Patient requesting refill on Enbrel sent to [facility].

## 2020-04-21 NOTE — Telephone Encounter (Signed)
Patient calling [facility] to schedule an appointment to see Dr. Otelia Sergeant for her neck. Per Claris Che with [facility], because this is a new issue, a new referral needs to be placed. Please send a new referral for patient to be seen for neck.

## 2020-04-22 DIAGNOSIS — M542 Cervicalgia: Secondary | ICD-10-CM | POA: Diagnosis not present

## 2020-04-22 DIAGNOSIS — G518 Other disorders of facial nerve: Secondary | ICD-10-CM | POA: Diagnosis not present

## 2020-04-22 DIAGNOSIS — M791 Myalgia, unspecified site: Secondary | ICD-10-CM | POA: Diagnosis not present

## 2020-04-22 DIAGNOSIS — G43719 Chronic migraine without aura, intractable, without status migrainosus: Secondary | ICD-10-CM | POA: Diagnosis not present

## 2020-05-09 ENCOUNTER — Telehealth: Payer: Self-pay | Admitting: Pharmacist

## 2020-05-09 NOTE — Telephone Encounter (Addendum)
Faxed completed patient and provider pages of renewal application from [employer] for ENBREL patient assistance. Will update once we receive notification.  Original documents sent to scan center. Filed copy of application at [employer].  Phone:  [phone] Fax: [phone]  Chesley Mires, PharmD, MPH Clinical Pharmacist (Rheumatology and Pulmonology)

## 2020-05-12 NOTE — Progress Notes (Signed)
Becky [name]  [phone] Fax:([phone]     ID: Becky [name] DOB: 20-May-1956  MR#: [id number]  YIF#:[id number]  Patient Care Team: Becky Bowen, MD as PCP - General (Endocrinology) Becky [name], Becky All, MD as Consulting Physician (Obstetrics and Gynecology) Becky [name], Becky Dad, MD as Consulting Physician (Oncology) Becky Partridge, DO as Consulting Physician (Neurology) Becky [name], Becky All, MD as Consulting Physician (Obstetrics and Gynecology) OTHER MD:  CHIEF COMPLAINT: breast cancer high risk  CURRENT TREATMENT: intensified screening    INTERVAL HISTORY: Becky [name] returns today for follow-up of her high risk breast cancer history. She is under intensified screening.   Since her last visit, she underwent breast MRI on 07/02/2019 showing: breast composition D; no evidence of malignancy in either breast.  She also underwent screening mammogram at [facility] on 02/19/2020 showing: breast density category D; no evidence of malignancy.   REVIEW OF SYSTEMS: Becky [name]'s mother, my patient Becky [name], passed away earlier this year.  This has been very difficult for the family.  She and Becky [name]'s [name] had been married 18 years.  He remains extremely active in the legislature but when he is in town he totally dependent on Becky [name] and currently she is not getting any help from her 2 sisters are also live in [place]: fully vaccinated [employer]), most recent dose February 2021   HISTORY OF PRESENT ILLNESS: From the initial intake note:   [name] was separately referred to the [facility] by Becky [name] for evaluation of eosinophilia and by Becky [name] for genetics counseling. It is best to discuss these unrelated problem separately:  Eosinophilia: We have a CBC with differential dating back to 01/26/2012. This showed WBC 6.1, Hb 11.9 and platelets 248. Absolute eosinophil count was 1.0. At that time the patient was worked up for  atypical chest [pain, with an equivocal Myoview. She underwent catheterization 01/28/2012 which showed normal coronaries. Chest X-ray 01/26/2012 showed no infiltrates. There are multiple other CBC reports, [name] normal, but w/o differentials. We just repeated a CBC/diff here, which shows WBC 5.7, Hb 13.3, platelets 265, and abs eosiniphil count  1.2 (normal to 0.5 ).  The patient has a significant rheumatoid history including rheumatoid arthritis and Sjogren's syndrome, followed by Becky [name]. She tells me the arthritis is well controlled. Currently she denies rash, difficulty swallowing or painful swallowing, gastritis or history of ulcers; denies cough, phlegm production, pleurisy, or history of repeated URIs or PNA; or changes in bowel habits. She underwent biopsy of an anterior neck mass 10/13/2009 which showed only reactive lymphoid hyperplasia([id number]). She denies travel outside the country, fevers, unexplained weight loss of unexplained fatigue.  High Risk for Breast Cancer: The patient's mother, currently 11, was diagnosed with breast cancer at age 33. She also has a history of colon polyps. The patient's father is 23, with no history of cancer. The patient's only full sister, was diagnosed with breast cancer at age 11. She had a negative test through the breast or ovarian cancer panel. Further on the mother's side there is one cousin with "female cancer" diagnosed in her 75s, and another with cervical cancer in the 73s. The patient's maternal grandmother was diagnosed with breast cancer at age 22. That grandmother had 3 sisters 2 of whom were diagnosed with breast cancers in their 82s. (The third sister died in her 37s from surgical complications not related to cancer).-- In addition, the patient has breast density category C/D. Her  breasts are lumpy making screening exam additionally difficult.  Biopsy of several right breast masses 10/09/1998 showed no evidence of malignancy ([id number])  Her  subsequent history is as detailed below.   PAST MEDICAL HISTORY: Past Medical History:  Diagnosis Date  . Abnormal ECG   . Anemia   . Anxiety   . Cataract    . Cateracts bil eyes removed  . GERD (gastroesophageal reflux disease)   . Heart murmur   . Hypercholesterolemia   . IDDM (insulin dependent diabetes mellitus) 1969  . Migraine headache   . Neuromuscular disorder (HCC)    bil legs & feet - neuropathy  . Neuropathy   . Osteoporosis    10/2012  . Otosclerosis   . RA (rheumatoid arthritis) ([place]) 2000  . S/P cardiac cath August 2013   Normal coronaries and normal LV function.  . Sjoegren syndrome     PAST SURGICAL HISTORY: Past Surgical History:  Procedure Laterality Date  . BREAST LUMPECTOMY  1995  . CARPAL TUNNEL RELEASE  2000  . CATARACT EXTRACTION Right 09-05-12  . CATARACT EXTRACTION Left 3/15  . CESAREAN SECTION  1987  . COLONOSCOPY    . EYE SURGERY     diabetic retinopathy   . HYSTEROSCOPY  02/2001   and D & C  . LYMPH NODE DISSECTION  2002  . TOE SURGERY  2000   bone removed    FAMILY HISTORY Family History  Problem Relation Age of Onset  . Breast cancer Mother 76  . Colon polyps Mother        unspecified number  . Uterine cancer Mother        metastatic  . Breast cancer Maternal Grandmother 20  . Stroke Father 71  . Rheum arthritis Sister   . Breast cancer Sister 48       lump; (-)GT (breast/ova ca panel, [facility])  . Skin cancer Maternal Grandfather        unspecified type  . Prostate cancer Maternal Grandfather        dx. late 67s  . Breast cancer Other        dx. in their late 65s  . Breast cancer Other        dx. 78-79  . Breast cancer Cousin        bilateral; dx. late 43s  . Colon cancer Neg Hx   . Esophageal cancer Neg Hx   . Rectal cancer Neg Hx   . Stomach cancer Neg Hx    (see also history of present illness above).    GYNECOLOGIC HISTORY:  Patient's last menstrual period was 09/19/2008.  Menarche age 76, first live birth age  47, she is GX P1, with the change of life occurring at age 29. She did not take hormone replacement. 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 Her husband Luciana Axe works as a Games developer. More specifically he does interior trim for [employer], so he does a lot of traveling. The patient's daughter is a Programme researcher, broadcasting/film/video and works in [place]. She got married in September 2016.  The patient's mother Becky [name] is my patient currently with endometrial cancer.  The patient Sister with breast cancer is also my patient.  The patient is not a church attender.     HEALTH MAINTENANCE: Social History   Tobacco Use  . Smoking status: Never Smoker  . Smokeless tobacco: Never Used  Vaping Use  .  Vaping Use: Never used  Substance Use Topics  . Alcohol use: Yes    Alcohol/week: 4.0 standard drinks    Types: 2 Shots of liquor, 2 Standard drinks or equivalent per week    Comment: maybe 1-2 cocktails per wk  . Drug use: No     Colonoscopy: 2012/ [facility]  PAP:  Bone density:   On forteo  Lipid panel:  Allergies  Allergen Reactions  . Chlorhexidine Rash    Chloroprep  . Macrobid [Nitrofurantoin Macrocrystal] Hives  . Ciprofloxacin Rash  . Penicillins Rash    Current Outpatient Medications  Medication Sig Dispense Refill  . aspirin 81 MG tablet Take 81 mg by mouth daily.    . calcium carbonate (OS-CAL) 600 MG TABS Take 600 mg by mouth 2 (two) times daily with a meal.     . CONTOUR NEXT TEST test strip   6  . cyclobenzaprine (FLEXERIL) 10 MG tablet Take 10 mg by mouth 3 (three) times daily as needed for muscle spasms. Reported on 07/03/2015    . DULoxetine (CYMBALTA) 30 MG capsule Take by mouth.    . DULoxetine (CYMBALTA) 60 MG capsule Take 60 mg by mouth daily.     Marland Kitchen etanercept (ENBREL SURECLICK) 50 MG/ML injection Inject one pen into skin every 10 days. 8 mL 0  . furosemide (LASIX) 20  MG tablet Take 10 mg by mouth daily.     Marland Kitchen gabapentin (NEURONTIN) 100 MG capsule Take 1 capsule (100 mg total) by mouth at bedtime. 30 capsule 3  . insulin aspart (NOVOLOG) 100 UNIT/ML injection Insulin pump    . losartan (COZAAR) 25 MG tablet Take 25 mg by mouth daily.     . Multiple Vitamin (MULTIVITAMIN) tablet Take 1 tablet by mouth daily.    . polyethylene glycol (MIRALAX / GLYCOLAX) 17 g packet Take 17 g by mouth 2 (two) times daily.     . pravastatin (PRAVACHOL) 20 MG tablet Take 20 mg by mouth daily.     . SUMAtriptan (IMITREX) 100 MG tablet Take 100 mg by mouth every 2 (two) hours as needed.    . topiramate (TOPAMAX) 200 MG tablet Take 200 mg by mouth daily.     . Tuberculin-Allergy Syringes 27G X 1/2" 1 ML KIT To be used with weekly methotrexate injections once weekly. 12 kit 3  . vitamin B-12 (CYANOCOBALAMIN) 1000 MCG tablet Take 1,000 mcg by mouth daily.     Current Facility-Administered Medications  Medication Dose Route Frequency Provider Last Rate Last Admin  . methylPREDNISolone acetate (DEPO-MEDROL) injection 40 mg  40 mg Other Once Magnus Sinning, MD        OBJECTIVE: white woman who appears stated age  37:   05/13/20 1303  BP: 128/62  Pulse: 86  Resp: 17  Temp: 98.1 F (36.7 C)  SpO2: 100%     Body mass index is 21.39 kg/m.    ECOG FS:1 - Symptomatic but completely ambulatory  Sclerae unicteric, EOMs intact Wearing a mask No cervical or supraclavicular adenopathy Lungs no rales or rhonchi Heart regular rate and rhythm Abd soft, nontender, positive bowel sounds MSK no focal spinal tenderness, no upper extremity lymphedema Neuro: nonfocal, well oriented, appropriate affect Breasts: Both breasts are quite lumpy.  It is difficult to remember from 1 year to the next but I do not palpate anything unequivocally suspicious for breast cancer.  Both axillae are benign.   CMP     Component Value Date/Time   NA 142 05/13/2020 1221  NA 142 01/05/2017 1257    K 4.1 05/13/2020 1221   K 4.3 01/05/2017 1257   CL 107 05/13/2020 1221   CO2 27 05/13/2020 1221   CO2 24 01/05/2017 1257   GLUCOSE 233 (H) 05/13/2020 1221   GLUCOSE 95 01/05/2017 1257   BUN 11 05/13/2020 1221   BUN 11.4 01/05/2017 1257   CREATININE 1.14 (H) 05/13/2020 1221   CREATININE 1.12 (H) 02/14/2020 1356   CREATININE 1.0 01/05/2017 1257   CALCIUM 9.6 05/13/2020 1221   CALCIUM 9.7 01/05/2017 1257   PROT 7.3 05/13/2020 1221   PROT 7.1 01/05/2017 1257   ALBUMIN 3.9 05/13/2020 1221   ALBUMIN 3.9 01/05/2017 1257   AST 29 05/13/2020 1221   AST 24 01/05/2017 1257   ALT 27 05/13/2020 1221   ALT 18 01/05/2017 1257   ALKPHOS 79 05/13/2020 1221   ALKPHOS 67 01/05/2017 1257   BILITOT 0.5 05/13/2020 1221   BILITOT 0.26 01/05/2017 1257   GFRNONAA 54 (L) 05/13/2020 1221   GFRNONAA 52 (L) 02/14/2020 1356   GFRAA 61 02/14/2020 1356    INo results found for: SPEP, UPEP  Lab Results  Component Value Date   WBC 4.5 05/13/2020   NEUTROABS 2.4 05/13/2020   HGB 13.9 05/13/2020   HCT 42.9 05/13/2020   MCV 92.7 05/13/2020   PLT 236 05/13/2020      Chemistry      Component Value Date/Time   NA 142 05/13/2020 1221   NA 142 01/05/2017 1257   K 4.1 05/13/2020 1221   K 4.3 01/05/2017 1257   CL 107 05/13/2020 1221   CO2 27 05/13/2020 1221   CO2 24 01/05/2017 1257   BUN 11 05/13/2020 1221   BUN 11.4 01/05/2017 1257   CREATININE 1.14 (H) 05/13/2020 1221   CREATININE 1.12 (H) 02/14/2020 1356   CREATININE 1.0 01/05/2017 1257      Component Value Date/Time   CALCIUM 9.6 05/13/2020 1221   CALCIUM 9.7 01/05/2017 1257   ALKPHOS 79 05/13/2020 1221   ALKPHOS 67 01/05/2017 1257   AST 29 05/13/2020 1221   AST 24 01/05/2017 1257   ALT 27 05/13/2020 1221   ALT 18 01/05/2017 1257   BILITOT 0.5 05/13/2020 1221   BILITOT 0.26 01/05/2017 1257       No results found for: LABCA2  No components found for: LABCA125  No results for input(s): INR in the last 168 hours.  Urinalysis      Component Value Date/Time   COLORURINE YELLOW 12/09/2018 2233   APPEARANCEUR CLEAR 12/09/2018 2233   LABSPEC 1.015 12/09/2018 2233   PHURINE 7.0 12/09/2018 2233   GLUCOSEU 250 (A) 12/09/2018 2233   HGBUR TRACE (A) 12/09/2018 2233   BILIRUBINUR NEGATIVE 12/09/2018 2233   BILIRUBINUR n 08/02/2013 1542   KETONESUR 40 (A) 12/09/2018 2233   PROTEINUR NEGATIVE 12/09/2018 2233   UROBILINOGEN negative 08/02/2013 1542   NITRITE NEGATIVE 12/09/2018 2233   LEUKOCYTESUR TRACE (A) 12/09/2018 2233    STUDIES: XR C-ARM NO REPORT  Result Date: 04/15/2020 Please see Notes tab for imaging impression.    ASSESSMENT: 64 y.o. [place], [place] woman at high risk for breast cancer, with a history of eosinophilia in the setting of rheumatoid arthritis and Sjogren's syndrome  (1) genetics testing on 04/15/15 through the [facility] Panel through [facility] found no deleterious mutations in M, BARD1, BRCA1, BRCA2, BRIP1, CDH1, CHEK2, FANCC, MLH1, MSH2, MSH6, NBN, PALB2, PMS2, PTEN, RAD51C, RAD51D, TP53, and XRCC2. This panel also includes deletion/duplication  analysis (without sequencing) for EPCAM.  (2)  benign eosinophilia likely associated with rheumatoid arthritis or secondary to medication: resolved  (3) breast cancer high risk:  (a) intolerant of tamoxifen (constipation)  (b) screening intensitifcation with yearly breast MRI starting Oct 2017    PLAN: Becky [name] still remains at high risk for breast cancer and has density deep breasts which means her mammograms are pretty much useless.  We are continuing the yearly MRI in addition to mammography.  Her next MRI will be in February and her next mammogram will be in August.  I will see her again in November of next year.  We discussed issues relating to her mother's death and her father's adjustment to that.  She wants to continue to support him but she also needs to have a life of her own and that is the tension she is  experiencing right now.  I encouraged her to call us with any other issues that may develop before the next visit.  Total encounter time 25 minutes.Chauncey Cruel, MD   05/13/2020 1:09 PM Medical Oncology and Hematology [facility] [place], [place] [address] Tel. [phone]    Fax. [phone]   I, Wilburn Mylar am acting as scribe for Becky. Virgie [name]. Lanie Schelling.  I, Lurline Del MD, have reviewed the above documentation for accuracy and completeness, and I agree with the above.   *Total Encounter Time as defined by the Centers for Medicare and Medicaid Services includes, in addition to the face-to-face time of a patient visit (documented in the note above) non-face-to-face time: obtaining and reviewing outside history, ordering and reviewing medications, tests or procedures, care coordination (communications with other health care professionals or caregivers) and documentation in the medical record.'

## 2020-05-13 ENCOUNTER — Other Ambulatory Visit: Payer: Self-pay | Admitting: *Deleted

## 2020-05-13 ENCOUNTER — Telehealth: Payer: Self-pay | Admitting: Oncology

## 2020-05-13 ENCOUNTER — Other Ambulatory Visit: Payer: Self-pay

## 2020-05-13 ENCOUNTER — Inpatient Hospital Stay: Payer: Medicare Other

## 2020-05-13 ENCOUNTER — Inpatient Hospital Stay: Payer: Medicare Other | Attending: Oncology | Admitting: Oncology

## 2020-05-13 VITALS — BP 128/62 | HR 86 | Temp 98.1°F | Resp 17 | Ht 64.0 in | Wt 124.6 lb

## 2020-05-13 DIAGNOSIS — Z8739 Personal history of other diseases of the musculoskeletal system and connective tissue: Secondary | ICD-10-CM

## 2020-05-13 DIAGNOSIS — M3500 Sicca syndrome, unspecified: Secondary | ICD-10-CM | POA: Diagnosis not present

## 2020-05-13 DIAGNOSIS — M069 Rheumatoid arthritis, unspecified: Secondary | ICD-10-CM | POA: Insufficient documentation

## 2020-05-13 DIAGNOSIS — E119 Type 2 diabetes mellitus without complications: Secondary | ICD-10-CM | POA: Insufficient documentation

## 2020-05-13 DIAGNOSIS — Z1239 Encounter for other screening for malignant neoplasm of breast: Secondary | ICD-10-CM | POA: Insufficient documentation

## 2020-05-13 DIAGNOSIS — D721 Eosinophilia, unspecified: Secondary | ICD-10-CM | POA: Diagnosis not present

## 2020-05-13 DIAGNOSIS — Z803 Family history of malignant neoplasm of breast: Secondary | ICD-10-CM | POA: Diagnosis not present

## 2020-05-13 DIAGNOSIS — Z794 Long term (current) use of insulin: Secondary | ICD-10-CM | POA: Insufficient documentation

## 2020-05-13 LAB — CBC WITH DIFFERENTIAL (CANCER CENTER ONLY)
Abs Immature Granulocytes: 0.01 10*3/uL (ref 0.00–0.07)
Basophils Absolute: 0.1 10*3/uL (ref 0.0–0.1)
Basophils Relative: 2 %
Eosinophils Absolute: 0.4 10*3/uL (ref 0.0–0.5)
Eosinophils Relative: 8 %
HCT: 42.9 % (ref 36.0–46.0)
Hemoglobin: 13.9 g/dL (ref 12.0–15.0)
Immature Granulocytes: 0 %
Lymphocytes Relative: 30 %
Lymphs Abs: 1.3 10*3/uL (ref 0.7–4.0)
MCH: 30 pg (ref 26.0–34.0)
MCHC: 32.4 g/dL (ref 30.0–36.0)
MCV: 92.7 fL (ref 80.0–100.0)
Monocytes Absolute: 0.4 10*3/uL (ref 0.1–1.0)
Monocytes Relative: 8 %
Neutro Abs: 2.4 10*3/uL (ref 1.7–7.7)
Neutrophils Relative %: 52 %
Platelet Count: 236 10*3/uL (ref 150–400)
RBC: 4.63 MIL/uL (ref 3.87–5.11)
RDW: 14.5 % (ref 11.5–15.5)
WBC Count: 4.5 10*3/uL (ref 4.0–10.5)
nRBC: 0 % (ref 0.0–0.2)

## 2020-05-13 LAB — CMP (CANCER CENTER ONLY)
ALT: 27 U/L (ref 0–44)
AST: 29 U/L (ref 15–41)
Albumin: 3.9 g/dL (ref 3.5–5.0)
Alkaline Phosphatase: 79 U/L (ref 38–126)
Anion gap: 8 (ref 5–15)
BUN: 11 mg/dL (ref 8–23)
CO2: 27 mmol/L (ref 22–32)
Calcium: 9.6 mg/dL (ref 8.9–10.3)
Chloride: 107 mmol/L (ref 98–111)
Creatinine: 1.14 mg/dL — ABNORMAL HIGH (ref 0.44–1.00)
GFR, Estimated: 54 mL/min — ABNORMAL LOW (ref >60)
Glucose, Bld: 233 mg/dL — ABNORMAL HIGH (ref 70–99)
Potassium: 4.1 mmol/L (ref 3.5–5.1)
Sodium: 142 mmol/L (ref 135–145)
Total Bilirubin: 0.5 mg/dL (ref 0.3–1.2)
Total Protein: 7.3 g/dL (ref 6.5–8.1)

## 2020-05-13 NOTE — Telephone Encounter (Signed)
Scheduled appts per 11/23 los. Gave pt a print out of AVS.  

## 2020-05-18 NOTE — Procedures (Signed)
Lumbar Facet Joint Nerve Denervation  [name]      Date of Birth: Jun 07, 1956 MRN: [id number] PCP: Adrian Prince, MD      Visit Date: 04/15/2020   Universal Protocol:    Date/Time: 11/28/215:20 PM  Consent Given By: the patient  Position: PRONE  Additional Comments: Vital signs were monitored before and after the procedure. Patient was prepped and draped in the usual sterile fashion. The correct patient, procedure, and site was verified.   Injection Procedure Details:   Procedure diagnoses:  1. Spondylosis without myelopathy or radiculopathy, lumbar region      Meds Administered:  Meds ordered this encounter  Medications  . methylPREDNISolone acetate (DEPO-MEDROL) injection 40 mg     Laterality: Right  Location/Site:  L1-L2  Needle: 18 ga.,  8mm active tip RF Cannula  Needle Placement: Along juncture of superior articular process and transverse pocess  Findings:  -Comments:  Procedure Details: For each desired target nerve, the corresponding transverse process (sacral ala for the L5 dorsal rami) was identified and the fluoroscope was positioned to square off the endplates of the corresponding vertebral body to achieve a true AP midline view.  The beam was then obliqued 15 to 20 degrees and caudally tilted 15 to 20 degrees to line up a trajectory along the target nerves. The skin over the target of the junction of superior articulating process and transverse process (sacral ala for the L5 dorsal rami) was infiltrated with 43ml of 1% Lidocaine without Epinephrine.  The 18 gauge 65mm active tip outer cannula was advanced in trajectory view to the target.  This procedure was repeated for each target nerve.  Then, for all levels, the outer cannula placement was fine-tuned and the position was then confirmed with bi-planar imaging.    Test stimulation was done both at sensory and motor levels to ensure there was no radicular stimulation. The target tissues  were then infiltrated with 1 ml of 1% Lidocaine without Epinephrine. Subsequently, a percutaneous neurotomy was carried out for 90 seconds at 80 degrees Celsius.  After the completion of the lesion, 1 ml of injectate was delivered. It was then repeated for each facet joint nerve mentioned above. Appropriate radiographs were obtained to verify the probe placement during the neurotomy.   Additional Comments:  The patient tolerated the procedure well Dressing: 2 x 2 sterile gauze and Band-Aid    Post-procedure details: Patient was observed during the procedure. Post-procedure instructions were reviewed.  Patient left the clinic in stable condition.

## 2020-05-18 NOTE — Progress Notes (Signed)
FAE BLOSSOM - 64 y.o. female MRN [id number]  Date of birth: [birth year]  Office Visit Note: Visit Date: 04/15/2020 PCP: Adrian Prince, MD Referred by: Adrian Prince, MD  Subjective: Chief Complaint  Patient presents with  . Lower Back - Pain   HPI:  Becky [name] is a 64 y.o. female who comes in today for planned radiofrequency ablation of the Right L1-L2 Lumbar facet joints. This would be ablation of the corresponding medial branches and/or dorsal rami.  Patient has had double diagnostic blocks with more than 50% relief.  These are documented on pain diary.  They have had chronic back pain for quite some time, more than 3 months, which has been an ongoing situation with recalcitrant axial back pain.  They have no radicular pain.  Their axial pain is worse with standing and ambulating and on exam today with facet loading.  They have had physical therapy as well as home exercise program.  The imaging noted in the chart below indicated facet pathology. Accordingly they meet all the criteria and qualification for for radiofrequency ablation and we are going to complete this today hopefully for more longer term relief as part of comprehensive management program.   ROS Otherwise per HPI.  Assessment & Plan: Visit Diagnoses:  1. Spondylosis without myelopathy or radiculopathy, lumbar region     Plan: No additional findings.   Meds & Orders:  Meds ordered this encounter  Medications  . methylPREDNISolone acetate (DEPO-MEDROL) injection 40 mg    Orders Placed This Encounter  Procedures  . Radiofrequency,Lumbar  . XR C-ARM NO REPORT    Follow-up: Return for visit to requesting physician as needed.   Procedures: No procedures performed  Lumbar Facet Joint Nerve Denervation  [name]      Date of Birth: September 09, 1955 MRN: [id number] PCP: Adrian Prince, MD      Visit Date: 04/15/2020   Universal Protocol:    Date/Time: 11/28/215:20 PM  Consent Given By:  the patient  Position: PRONE  Additional Comments: Vital signs were monitored before and after the procedure. Patient was prepped and draped in the usual sterile fashion. The correct patient, procedure, and site was verified.   Injection Procedure Details:   Procedure diagnoses:  1. Spondylosis without myelopathy or radiculopathy, lumbar region      Meds Administered:  Meds ordered this encounter  Medications  . methylPREDNISolone acetate (DEPO-MEDROL) injection 40 mg     Laterality: Right  Location/Site:  L1-L2  Needle: 18 ga.,  24mm active tip RF Cannula  Needle Placement: Along juncture of superior articular process and transverse pocess  Findings:  -Comments:  Procedure Details: For each desired target nerve, the corresponding transverse process (sacral ala for the L5 dorsal rami) was identified and the fluoroscope was positioned to square off the endplates of the corresponding vertebral body to achieve a true AP midline view.  The beam was then obliqued 15 to 20 degrees and caudally tilted 15 to 20 degrees to line up a trajectory along the target nerves. The skin over the target of the junction of superior articulating process and transverse process (sacral ala for the L5 dorsal rami) was infiltrated with 97ml of 1% Lidocaine without Epinephrine.  The 18 gauge 72mm active tip outer cannula was advanced in trajectory view to the target.  This procedure was repeated for each target nerve.  Then, for all levels, the outer cannula placement was fine-tuned and the position was then confirmed with bi-planar imaging.  Test stimulation was done both at sensory and motor levels to ensure there was no radicular stimulation. The target tissues were then infiltrated with 1 ml of 1% Lidocaine without Epinephrine. Subsequently, a percutaneous neurotomy was carried out for 90 seconds at 80 degrees Celsius.  After the completion of the lesion, 1 ml of injectate was delivered. It was then  repeated for each facet joint nerve mentioned above. Appropriate radiographs were obtained to verify the probe placement during the neurotomy.   Additional Comments:  The patient tolerated the procedure well Dressing: 2 x 2 sterile gauze and Band-Aid    Post-procedure details: Patient was observed during the procedure. Post-procedure instructions were reviewed.  Patient left the clinic in stable condition.        Clinical History: Chronic low back pain and radiculopathy, worsening  EXAM: MRI LUMBAR SPINE WITHOUT CONTRAST  TECHNIQUE: Multiplanar, multisequence MR imaging of the lumbar spine was performed. No intravenous contrast was administered.  COMPARISON:  None.  FINDINGS: Segmentation:  Standard.  Alignment:  No significant anteroposterior listhesis.  Vertebrae: Vertebral body heights are maintained apart from degenerative endplate irregularity primarily at L1-L2. There are chronic appearing degenerative endplate marrow changes at this level.  Conus medullaris and cauda equina: Conus extends to the L1-L2 level. Conus and cauda equina appear normal.  Paraspinal and other soft tissues: Unremarkable.  Disc levels:  L1-L2: Disc bulge with endplate osteophytic ridging eccentric to the right. No canal stenosis. Mild narrowing of the right lateral recess. Moderate right foraminal stenosis. No left foraminal stenosis.  L2-L3:  Minimal disc bulge.  No canal or foraminal stenosis.  L3-L4: Minimal disc bulge. Mild facet arthropathy with ligamentum flavum infolding. No canal or foraminal stenosis.  L4-L5: Minimal disc bulge. Mild facet arthropathy with ligamentum flavum infolding. No canal or foraminal stenosis.  L5-S1: Disc bulge with endplate osteophytic ridging eccentric to the left. Mild facet arthropathy. No significant canal stenosis. Minor right and mild left foraminal stenosis.  IMPRESSION: Overall mild degenerative changes as detailed  above, greatest at L1-L2.   Electronically Signed   By: Guadlupe Spanish M.D.   On: 01/07/2020 09:50     Objective:  VS:  HT:    WT:   BMI:     BP:139/69  HR:85bpm  TEMP: ( )  RESP:  Physical Exam Constitutional:      General: She is not in acute distress.    Appearance: Normal appearance. She is not ill-appearing.  HENT:     Head: Normocephalic and atraumatic.     Right Ear: External ear normal.     Left Ear: External ear normal.  Eyes:     Extraocular Movements: Extraocular movements intact.  Cardiovascular:     Rate and Rhythm: Normal rate.     Pulses: Normal pulses.  Musculoskeletal:     Right lower leg: No edema.     Left lower leg: No edema.     Comments: Patient has good distal strength with no pain over the greater trochanters.  No clonus or focal weakness. Patient somewhat slow to rise from a seated position to full extension.  There is concordant low back pain with right side facet loading and lumbar spine extension rotation.  There are no definitive trigger points but the patient is somewhat tender across the lower back and PSIS.  There is no pain with hip rotation.   Skin:    Findings: No erythema, lesion or rash.  Neurological:     General: No focal deficit present.  Mental Status: She is alert and oriented to person, place, and time.     Sensory: No sensory deficit.     Motor: No weakness or abnormal muscle tone.     Coordination: Coordination normal.  Psychiatric:        Mood and Affect: Mood normal.        Behavior: Behavior normal.      Imaging: No results found.

## 2020-05-22 ENCOUNTER — Other Ambulatory Visit: Payer: Self-pay

## 2020-05-22 ENCOUNTER — Ambulatory Visit: Payer: Medicare Other | Admitting: Specialist

## 2020-05-22 ENCOUNTER — Encounter: Payer: Self-pay | Admitting: Specialist

## 2020-05-22 VITALS — BP 99/59 | HR 98 | Ht 64.0 in | Wt 124.6 lb

## 2020-05-22 DIAGNOSIS — M542 Cervicalgia: Secondary | ICD-10-CM

## 2020-05-22 DIAGNOSIS — E1040 Type 1 diabetes mellitus with diabetic neuropathy, unspecified: Secondary | ICD-10-CM | POA: Diagnosis not present

## 2020-05-22 DIAGNOSIS — E785 Hyperlipidemia, unspecified: Secondary | ICD-10-CM | POA: Diagnosis not present

## 2020-05-22 DIAGNOSIS — M4722 Other spondylosis with radiculopathy, cervical region: Secondary | ICD-10-CM

## 2020-05-22 DIAGNOSIS — E103493 Type 1 diabetes mellitus with severe nonproliferative diabetic retinopathy without macular edema, bilateral: Secondary | ICD-10-CM | POA: Diagnosis not present

## 2020-05-22 DIAGNOSIS — N1831 Chronic kidney disease, stage 3a: Secondary | ICD-10-CM | POA: Diagnosis not present

## 2020-05-22 MED ORDER — GABAPENTIN 100 MG PO CAPS: 100.0000 mg | ORAL_CAPSULE | Freq: Every day | ORAL | 0 refills | Status: DC

## 2020-05-22 NOTE — Patient Instructions (Signed)
Plan: Avoid overhead lifting and overhead use of the arms. Do not lift greater than 5-10 lbs. Adjust head rest in vehicle to prevent hyperextension if rear ended. Take extra precautions to avoid falling. Physical therapy, McKenzie exercises, Cervical spine ROM, H, M, U/S, Traction, 5-10 lbs intermittant.  Gabapentin 100 mg po qhs

## 2020-05-22 NOTE — Progress Notes (Addendum)
Office Visit Note   [name]           Date of Birth: Dec 03, 1955           MRN: [id number] Visit Date: 05/22/2020              Requested by: Pollyann Savoy, MD [address] [place],  [place] [address] PCP: Adrian Prince, MD   Assessment & Plan: Visit Diagnoses:  1. Cervicalgia   2. Other spondylosis with radiculopathy, cervical region     Plan: Avoid overhead lifting and overhead use of the arms. Do not lift greater than 5-10 lbs. Adjust head rest in vehicle to prevent hyperextension if rear ended. Take extra precautions to avoid falling. Physical therapy, McKenzie exercises, Cervical spine ROM, H, M, U/S, Traction, 5-10 lbs intermittant.   Follow-Up Instructions: No follow-ups on file.   Orders:  No orders of the defined types were placed in this encounter.  No orders of the defined types were placed in this encounter.     Procedures: No procedures performed   Clinical Data: Findings:  Cervical MRI with right C3-4, C4-5 and C6-7 and mild bilateral C5-6. Disc narrowing C5-6 greater than C6-7 greater than C4-5.  C1-2 without significant narrowing or pannus formation. Mild facet edema C1-2.     Subjective: Chief Complaint  Patient presents with  . Neck - Pain, Follow-up    MRI Review    64 year old right handed female with history of neck pain and Degenerative cervical spine and was given tramadol in the past. She is seen  Dr. Corliss Skains for RA and she recommended MRI of the cervical spine. She has pain into the trapezieus bilaterally and pain into the right superior and lateral right shoulder with some radiation downwards over the lateral right arm. No numbness or paresthesias. She reports she is having more headaches of a migraine phenomena. She has pain with lateral rotation and with extension. More so with extension with pain into the mid cervicothoracic junction centrally. No bowel or bladder difficulty. No trouble with  standing and walking. No tripping or falling.  She has diabetes and peripheral neuropathy. The increased trapezial pain has been increasing over the last several years. Sees Dr. Neale Burly for migraines but the last 3-4 months she has had to have trigger injectons with improvement for about 2 weeks. Migraine meds don't seem to improve the pain when it relates to her neck.    Review of Systems  Constitutional: Negative.  Negative for activity change, appetite change, chills, diaphoresis, fatigue, fever and unexpected weight change.  HENT: Positive for hearing loss. Negative for congestion, dental problem, drooling, ear discharge, ear pain, facial swelling, mouth sores, nosebleeds, postnasal drip, rhinorrhea, sinus pressure, sinus pain, sneezing, sore throat, tinnitus, trouble swallowing and voice change.   Eyes: Negative.  Negative for photophobia, pain, discharge, redness, itching and visual disturbance.  Respiratory: Positive for shortness of breath. Negative for apnea, cough, choking, chest tightness, wheezing and stridor.   Cardiovascular: Negative.  Negative for chest pain, palpitations and leg swelling.  Gastrointestinal: Positive for constipation. Negative for abdominal distention, abdominal pain, anal bleeding, blood in stool, diarrhea, nausea, rectal pain and vomiting.  Endocrine: Positive for cold intolerance. Negative for heat intolerance, polydipsia, polyphagia and polyuria.  Genitourinary: Negative.  Negative for difficulty urinating, dyspareunia, dysuria, enuresis, flank pain, frequency, genital sores, hematuria, menstrual problem, pelvic pain and urgency.  Musculoskeletal: Positive for neck pain and neck stiffness. Negative for arthralgias, back pain, gait  problem, joint swelling and myalgias.  Skin: Negative.  Negative for color change, pallor, rash and wound.  Allergic/Immunologic: Negative for environmental allergies, food allergies and immunocompromised state.  Neurological: Positive  for headaches. Negative for tremors, seizures, syncope, speech difficulty, weakness, light-headedness and numbness.  Hematological: Negative.  Negative for adenopathy. Does not bruise/bleed easily.  Psychiatric/Behavioral: Negative.  Negative for agitation, behavioral problems, confusion, decreased concentration, dysphoric mood, hallucinations, self-injury, sleep disturbance and suicidal ideas. The patient is not nervous/anxious and is not hyperactive.      Objective: Vital Signs: BP (!) 99/59 (BP Location: Left Arm, Patient Position: Sitting)   Pulse 98   Ht 5\' 4"  (1.626 m)   Wt 124 lb 9.6 oz (56.5 kg)   LMP 09/19/2008   BMI 21.39 kg/m   Physical Exam Constitutional:      Appearance: She is well-developed.  HENT:     Head: Normocephalic and atraumatic.  Eyes:     Pupils: Pupils are equal, round, and reactive to light.  Pulmonary:     Effort: Pulmonary effort is normal.     Breath sounds: Normal breath sounds.  Abdominal:     General: Bowel sounds are normal.     Palpations: Abdomen is soft.  Musculoskeletal:     Cervical back: Normal range of motion and neck supple.     Lumbar back: Negative right straight leg raise test and negative left straight leg raise test.  Skin:    General: Skin is warm and dry.  Neurological:     Mental Status: She is alert and oriented to person, place, and time.  Psychiatric:        Behavior: Behavior normal.        Thought Content: Thought content normal.        Judgment: Judgment normal.     Back Exam   Tenderness  The patient is experiencing tenderness in the cervical.  Range of Motion  Extension: abnormal  Flexion: normal  Lateral bend right: abnormal  Lateral bend left: abnormal  Rotation right: abnormal  Rotation left: abnormal   Muscle Strength  Right Quadriceps:  5/5  Left Quadriceps:  5/5  Right Hamstrings:  5/5  Left Hamstrings:  5/5   Tests  Straight leg raise right: negative Straight leg raise left:  negative  Reflexes  Patellar: 1/4 Biceps: 0/4 Babinski's sign: normal   Other  Toe walk: normal Heel walk: normal      Specialty Comments:  No specialty comments available.  Imaging: No results found.   PMFS History: Patient Active Problem List   Diagnosis Date Noted  . Posterior vitreous detachment of right eye 03/18/2020  . 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 Stable treated proliferative diabetic retinopathy of right eye determined by examination associated with type 2 diabetes mellitus (HCC) 03/18/2020  . History of vitrectomy 03/18/2020  . Complicated UTI (urinary tract infection) 12/13/2018  . Failure of outpatient treatment 12/11/2018  . Acute metabolic encephalopathy 12/11/2018  . Fever 12/10/2018  . Breast cancer screening, high risk patient 05/09/2018  . Eosinophilia 01/31/2017  . Iritis 01/04/2017  . History of joint swelling 08/24/2016  . Osteoarthritis of knees, bilateral 08/24/2016  . History of migraine 08/24/2016  . History of depression 08/24/2016  . History of osteoporosis 08/24/2016  . DDD (degenerative disc disease), lumbar 08/24/2016  . High risk medication use 08/24/2016  . Trigger finger, left middle finger 08/24/2016  .  Trigger finger, right ring finger 08/24/2016  . 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 Increased risk of breast cancer 07/06/2015  . IDDM (insulin dependent diabetes mellitus) 07/06/2015  . 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 Sjogren's syndrome (HCC) 07/06/2015  . Genetic testing 05/12/2015  . Family history of breast cancer 04/15/2015  . Fibrocystic breast disease 10/11/2012  . GERD 10/01/2009  . Chest pain with moderate risk for cardiac etiology 09/02/2009  . DYSPHAGIA 09/02/2009  . ANXIETY 08/15/2009  .  ULCER-GASTRIC 08/15/2009  . CONSTIPATION 08/15/2009  . SLOW TRANSIT CONSTIPATION 08/15/2009  . FLATULENCE ERUCTATION AND GAS PAIN 08/15/2009   Past Medical History:  Diagnosis Date  . Abnormal ECG   . Anemia   . Anxiety   . Cataract    . Cateracts bil eyes removed  . GERD (gastroesophageal reflux disease)   . Heart murmur   . Hypercholesterolemia   . IDDM (insulin dependent diabetes mellitus) 1969  . Migraine headache   . Neuromuscular disorder (HCC)    bil legs & feet - neuropathy  . Neuropathy   . Osteoporosis    10/2012  . Otosclerosis   . RA (rheumatoid arthritis) (HCC) 2000  . S/P cardiac cath August 2013   Normal coronaries and normal LV function.  . Sjoegren syndrome     Family History  Problem Relation Age of Onset  . Breast cancer Mother 21  . Colon polyps Mother        unspecified number  . Uterine cancer Mother        metastatic  . Breast cancer Maternal Grandmother 24  . Stroke Father 36  . Rheum arthritis Sister   . Breast cancer Sister 48       lump; (-)GT (breast/ova ca panel, Gene[facility]x)  . Skin cancer Maternal Grandfather        unspecified type  . Prostate cancer Maternal Grandfather        dx. late 50s  . Breast cancer Other        dx. in their late 10s  . Breast cancer Other        dx. 78-79  . Breast cancer Cousin        bilateral; dx. late 45s  . Colon cancer Neg Hx   . Esophageal cancer Neg Hx   . Rectal cancer Neg Hx   . Stomach cancer Neg Hx     Past Surgical History:  Procedure Laterality Date  . BREAST LUMPECTOMY  1995  . CARPAL TUNNEL RELEASE  2000  . CATARACT EXTRACTION Right 09-05-12  . CATARACT EXTRACTION Left 3/15  . CESAREAN SECTION  1987  . COLONOSCOPY    . EYE SURGERY     diabetic retinopathy   . HYSTEROSCOPY  02/2001   and D & C  . LYMPH NODE DISSECTION  2002  . TOE SURGERY  2000   bone removed   Social History   Occupational History  . Occupation: disability    Employer: UNEMPLOYED  Tobacco Use  . Smoking  status: Never Smoker  . Smokeless tobacco: Never Used  Vaping Use  . Vaping Use: Never used  Substance and Sexual Activity  . Alcohol use: Yes    Alcohol/week: 4.0 standard drinks    Types: 2 Shots of liquor, 2 Standard drinks or equivalent per week    Comment: maybe 1-2 cocktails per wk  . Drug use: No  . Sexual activity: Yes    Partners:  Male    Birth control/protection: Post-menopausal    Comment: vasectomy

## 2020-05-29 ENCOUNTER — Telehealth: Payer: Self-pay

## 2020-05-29 NOTE — Telephone Encounter (Signed)
Pt called back and sch 06/25/20

## 2020-05-29 NOTE — Telephone Encounter (Signed)
Called pt and lvm #2 

## 2020-05-29 NOTE — Telephone Encounter (Signed)
Patient called she is returning your phone call. CB:[phone]

## 2020-06-04 DIAGNOSIS — M542 Cervicalgia: Secondary | ICD-10-CM | POA: Diagnosis not present

## 2020-06-04 DIAGNOSIS — G518 Other disorders of facial nerve: Secondary | ICD-10-CM | POA: Diagnosis not present

## 2020-06-04 DIAGNOSIS — G43719 Chronic migraine without aura, intractable, without status migrainosus: Secondary | ICD-10-CM | POA: Diagnosis not present

## 2020-06-04 DIAGNOSIS — M791 Myalgia, unspecified site: Secondary | ICD-10-CM | POA: Diagnosis not present

## 2020-06-14 DIAGNOSIS — Z8616 Personal history of COVID-19: Secondary | ICD-10-CM

## 2020-06-14 HISTORY — DX: Personal history of COVID-19: Z86.16

## 2020-06-17 IMAGING — DX PORTABLE CHEST - 1 VIEW
1 series · 1 of 1 positions shown · non-contrast
Comparison: 09/15/2016

CLINICAL DATA: Suspected sepsis

EXAM:
PORTABLE CHEST 1 VIEW

[chest ap]
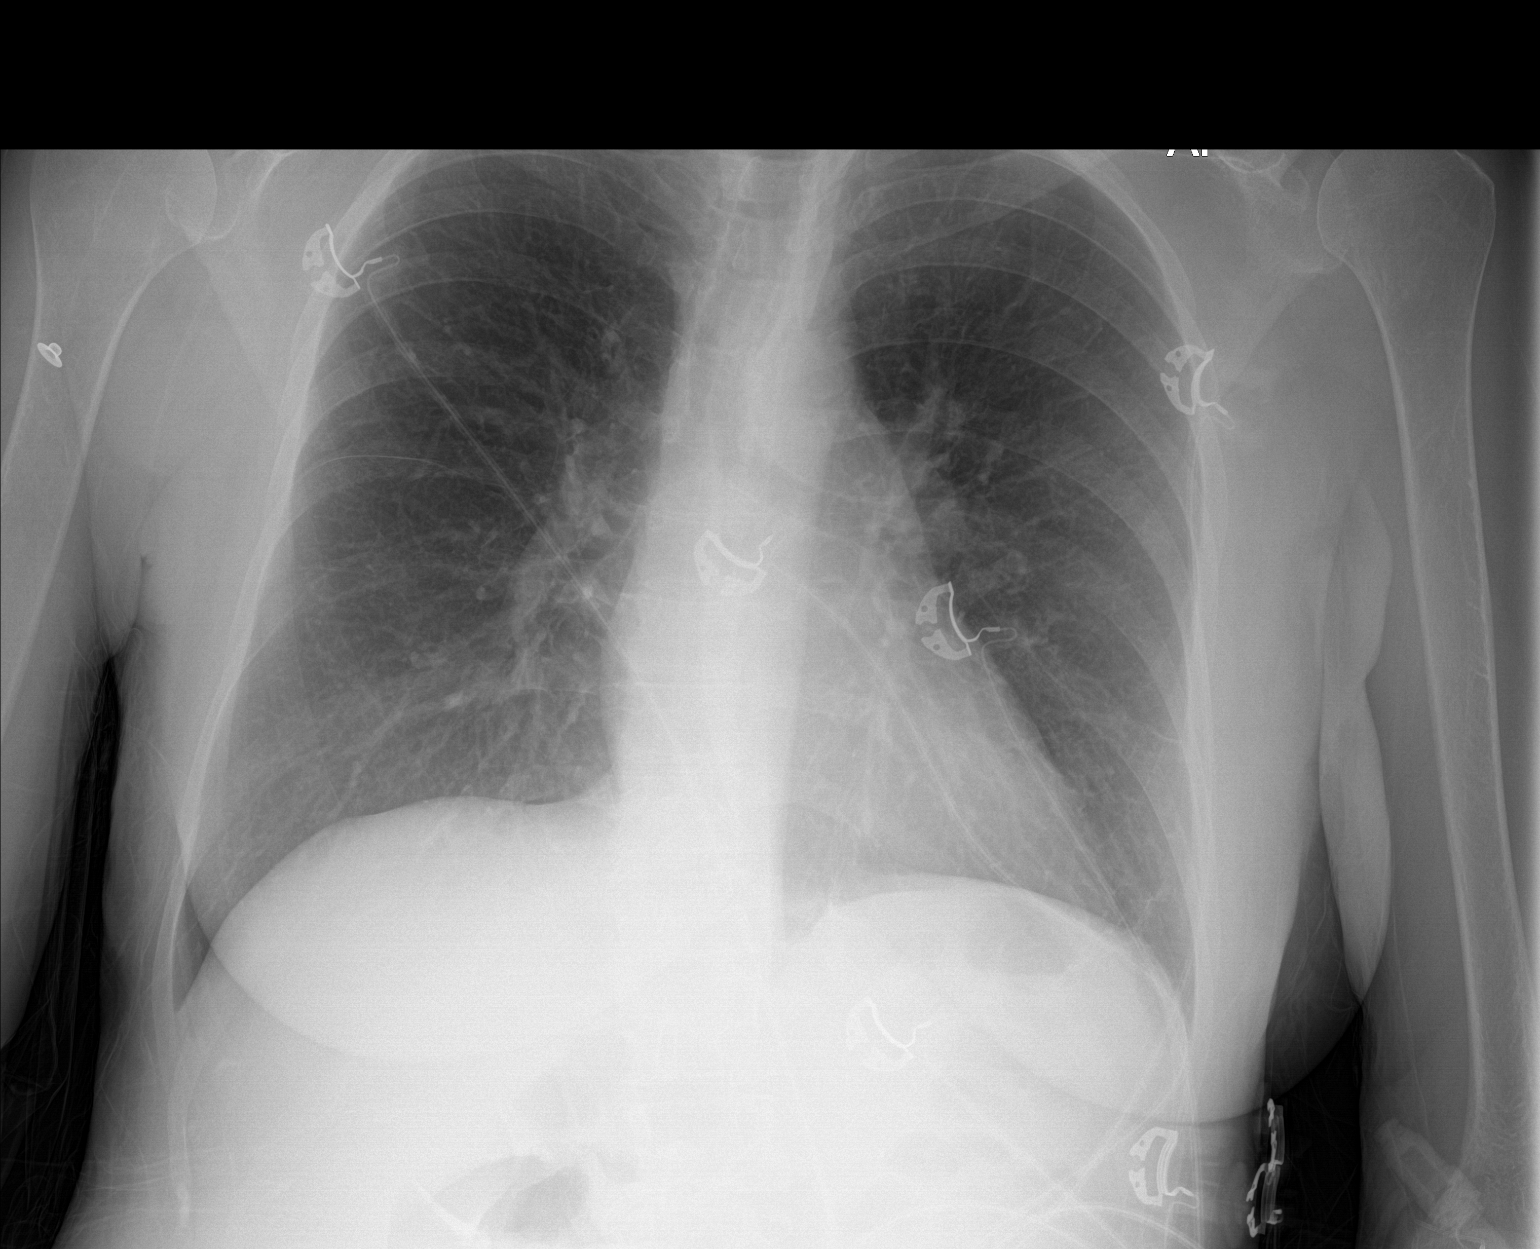

[1 of 1 positions shown; findings below may reference images not displayed]

FINDINGS: The heart size and mediastinal contours are within normal limits.
Both lungs are clear. The visualized skeletal structures are
unremarkable.
IMPRESSION: No active disease.

## 2020-06-25 ENCOUNTER — Ambulatory Visit: Payer: Medicare Other | Admitting: Physical Medicine and Rehabilitation

## 2020-06-30 ENCOUNTER — Ambulatory Visit: Payer: Medicare Other | Admitting: Specialist

## 2020-06-30 ENCOUNTER — Telehealth: Payer: Self-pay | Admitting: Physical Medicine and Rehabilitation

## 2020-06-30 NOTE — Telephone Encounter (Signed)
Patient returned call asked for a call back. The number to contact patient is [phone]

## 2020-06-30 NOTE — Telephone Encounter (Signed)
Received a fax from  [employer] regarding an approval for ENBREL patient assistance from 06/27/20 to 06/20/21.   Phone number: [phone] Fax: [phone]  Chesley Mires, PharmD, MPH Clinical Pharmacist (Rheumatology and Pulmonology)

## 2020-06-30 NOTE — Telephone Encounter (Signed)
Called pt and sch 2/2

## 2020-07-03 ENCOUNTER — Telehealth: Payer: Self-pay | Admitting: *Deleted

## 2020-07-03 NOTE — Telephone Encounter (Signed)
Patient contacted the office stating she has been diagnosed with Covid. Patient is currently on Enbrel. Patient states her symptoms on 06/14/2020 and had a positive home test. Patient states she has had both vaccinations and the booster. Patient states she tan a fever for about 13 days. Patient states she has issues with her blood sugar being elevated. Patient states she received an antibiotic from her PCP. Patient states her test on 06/29/2019. Patient states she is having fatigue that has lingered. Patient advised to contact PCP to for evaluation. Patient advised she is out of the window for the monoclonal antibody infusion.

## 2020-07-03 NOTE — Progress Notes (Signed)
Office Visit Note  [name]             Date of Birth: 19-Jul-1955           MRN: [id number]             PCP: Adrian Prince, MD Referring: Adrian Prince, MD Visit Date: 07/17/2020 Occupation: @GUAROCC @  Subjective:  Other (Patient tested positive for on 06/14/2020 and has been holding enbrel since. Patient reports she is still experiencing fatigue. Patient would like to discuss prolia as well. )   History of Present Illness: Becky [name] is a 65 y.o. female with history of rheumatoid arthritis.  She states she has been off Enbrel since December.  She is been experiencing increased joint stiffness.  She has not noticed any joint swelling.  She developed COVID 19 infection on December 25.  She did not require monoclonal antibody infusion.  She developed mostly upper respiratory tract infection and did not have any pulmonary involvement.  She continues to have fatigue.  She also has lot of lower back pain.  She has been followed by Dr. December 27 and Dr. Otelia Sergeant.  She was seen by her neurologist yesterday for migraine headaches and was given cortisone injection.  She states her blood sugar has been running high.  She was seen by Dr. Alvester Morin who recommended that she should get Prolia from our office.  She had a DEXA scan done by him.  Activities of Daily Living:  Patient reports morning stiffness for several hours.   Patient Reports nocturnal pain.  Difficulty dressing/grooming: Denies Difficulty climbing stairs: Denies Difficulty getting out of chair: Denies Difficulty using hands for taps, buttons, cutlery, and/or writing: Denies  Review of Systems  Constitutional: Positive for fatigue. Negative for night sweats, weight gain and weight loss.  HENT: Positive for mouth dryness and nose dryness. Negative for mouth sores, trouble swallowing and trouble swallowing.   Eyes: Positive for dryness. Negative for pain, redness, itching and visual disturbance.  Respiratory: Positive  for shortness of breath. Negative for cough and difficulty breathing.   Cardiovascular: Negative for chest pain, palpitations, hypertension, irregular heartbeat and swelling in legs/feet.  Gastrointestinal: Negative for blood in stool, constipation and diarrhea.  Endocrine: Negative for increased urination.  Genitourinary: Negative for difficulty urinating and vaginal dryness.  Musculoskeletal: Positive for arthralgias, joint pain, myalgias, morning stiffness and myalgias. Negative for joint swelling, muscle weakness and muscle tenderness.  Skin: Negative for color change, rash, hair loss, redness, skin tightness, ulcers and sensitivity to sunlight.  Allergic/Immunologic: Negative for susceptible to infections.  Neurological: Positive for numbness and headaches. Negative for dizziness, memory loss, night sweats and weakness.  Hematological: Positive for bruising/bleeding tendency. Negative for swollen glands.  Psychiatric/Behavioral: Negative for depressed mood, confusion and sleep disturbance. The patient is nervous/anxious.     PMFS History:  Patient Active Problem List   Diagnosis Date Noted  . Posterior vitreous detachment of right eye 03/18/2020  . 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 Stable treated proliferative diabetic retinopathy of right eye determined by examination associated with type 2 diabetes mellitus (HCC) 03/18/2020  . History of vitrectomy 03/18/2020  . Complicated UTI (urinary tract infection) 12/13/2018  . Failure of outpatient treatment 12/11/2018  . Acute metabolic encephalopathy 12/11/2018  . Fever 12/10/2018  . Breast cancer screening, high risk patient 05/09/2018  . Eosinophilia 01/31/2017  . Iritis 01/04/2017  .  History of joint swelling 08/24/2016  . Osteoarthritis of knees, bilateral 08/24/2016  . History of migraine 08/24/2016   . History of depression 08/24/2016  . History of osteoporosis 08/24/2016  . DDD (degenerative disc disease), lumbar 08/24/2016  . High risk medication use 08/24/2016  . Trigger finger, left middle finger 08/24/2016  . Trigger finger, right ring finger 08/24/2016  . 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 Increased risk of breast cancer 07/06/2015  . IDDM (insulin dependent diabetes mellitus) 07/06/2015  . 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 Sjogren's syndrome (HCC) 07/06/2015  . Genetic testing 05/12/2015  . Family history of breast cancer 04/15/2015  . Fibrocystic breast disease 10/11/2012  . GERD 10/01/2009  . Chest pain with moderate risk for cardiac etiology 09/02/2009  . DYSPHAGIA 09/02/2009  . ANXIETY 08/15/2009  . ULCER-GASTRIC 08/15/2009  . CONSTIPATION 08/15/2009  . SLOW TRANSIT CONSTIPATION 08/15/2009  . FLATULENCE ERUCTATION AND GAS PAIN 08/15/2009    Past Medical History:  Diagnosis Date  . Abnormal ECG   . Anemia   . Anxiety   . Cataract    . Cateracts bil eyes removed  . GERD (gastroesophageal reflux disease)   . Heart murmur   . History of COVID-19 06/14/2020  . Hypercholesterolemia   . IDDM (insulin dependent diabetes mellitus) 1969  . Migraine headache   . Neuromuscular disorder (HCC)    bil legs & feet - neuropathy  . Neuropathy   . Osteoporosis    10/2012  . Otosclerosis   . RA (rheumatoid arthritis) (HCC) 2000  . S/P cardiac cath August 2013   Normal coronaries and normal LV function.  . Sjoegren syndrome     Family History  Problem Relation Age of Onset  . Breast cancer Mother 24  . Colon polyps Mother        unspecified number  . Uterine cancer Mother        metastatic  . Breast cancer Maternal Grandmother 79  . Stroke Father 18  . Rheum arthritis Sister   . Breast cancer Sister 48       lump; (-)GT (breast/ova ca panel, [facility])  . Skin cancer Maternal  Grandfather        unspecified type  . Prostate cancer Maternal Grandfather        dx. late 37s  . Breast cancer Other        dx. in their late 38s  . Breast cancer Other        dx. 78-79  . Breast cancer Cousin        bilateral; dx. late 39s  . Colon cancer Neg Hx   . Esophageal cancer Neg Hx   . Rectal cancer Neg Hx   . Stomach cancer Neg Hx    Past Surgical History:  Procedure Laterality Date  . back ablation  03/2020  . BREAST LUMPECTOMY  1995  . CARPAL TUNNEL RELEASE  2000  . CATARACT EXTRACTION Right 09-05-12  . CATARACT EXTRACTION Left 3/15  . CESAREAN SECTION  1987  . COLONOSCOPY    . EYE SURGERY     diabetic retinopathy   . HYSTEROSCOPY  02/2001   and D & C  . LYMPH NODE DISSECTION  2002  . TOE SURGERY  2000   bone removed   Social History   Social History Narrative  . Not on file   Immunization History  Administered Date(s) Administered  . PFIZER(Purple Top)SARS-COV-2 Vaccination 07/11/2019, 08/01/2019,  02/05/2020  . Tdap 06/21/2013     Objective: Vital Signs: BP 115/68 (BP Location: Left Arm, Patient Position: Sitting, Cuff Size: Normal)   Pulse 85   Resp 13   Ht  (1.626 m)   Wt 122 lb 3.2 oz (55.4 kg)   LMP 09/19/2008   BMI 20.98 kg/m    Physical Exam Vitals and nursing note reviewed.  Constitutional:      Appearance: She is well-developed and well-nourished.  HENT:     Head: Normocephalic and atraumatic.  Eyes:     Extraocular Movements: EOM normal.     Conjunctiva/sclera: Conjunctivae normal.  Cardiovascular:     Rate and Rhythm: Normal rate and regular rhythm.     Pulses: Intact distal pulses.     Heart sounds: Normal heart sounds.  Pulmonary:     Effort: Pulmonary effort is normal.     Breath sounds: Normal breath sounds.  Abdominal:     General: Bowel sounds are normal.     Palpations: Abdomen is soft.  Musculoskeletal:     Cervical back: Normal range of motion.  Lymphadenopathy:     Cervical: No cervical adenopathy.   Skin:    General: Skin is warm and dry.     Capillary Refill: Capillary refill takes less than 2 seconds.  Neurological:     Mental Status: She is alert and oriented to person, place, and time.  Psychiatric:        Mood and Affect: Mood and affect normal.        Behavior: Behavior normal.      Musculoskeletal Exam: C-spine was in good range of motion.  She discomfort range of motion for lumbar spine.  Shoulder joints and elbow joints with good range of motion.  She synovial thickening over MCP joints but no synovitis.  PIP and DIP prominence was noted.  Hip joints and knee joints with good range of motion.  She had no tenderness over ankles or MTPs.  CDAI Exam: CDAI Score: 1.2  Patient Global: 6 mm; Provider Global: 6 mm Swollen: 0 ; Tender: 0  Joint Exam 07/17/2020   No joint exam has been documented for this visit   There is currently no information documented on the homunculus. Go to the Rheumatology activity and complete the homunculus joint exam.  Investigation: No additional findings.  Imaging: No results found.  Recent Labs: Lab Results  Component Value Date   WBC 4.5 05/13/2020   HGB 13.9 05/13/2020   PLT 236 05/13/2020   NA 142 05/13/2020   K 4.1 05/13/2020   CL 107 05/13/2020   CO2 27 05/13/2020   GLUCOSE 233 (H) 05/13/2020   BUN 11 05/13/2020   CREATININE 1.14 (H) 05/13/2020   BILITOT 0.5 05/13/2020   ALKPHOS 79 05/13/2020   AST 29 05/13/2020   ALT 27 05/13/2020   PROT 7.3 05/13/2020   ALBUMIN 3.9 05/13/2020   CALCIUM 9.6 05/13/2020   GFRAA 61 02/14/2020   QFTBGOLD NEGATIVE 04/06/2017   QFTBGOLDPLUS NEGATIVE 08/01/2019    Speciality Comments: Recieves Enbrel Sureclick through [employer].  Procedures:  No procedures performed Allergies: Chlorhexidine, Macrobid [nitrofurantoin macrocrystal], Ciprofloxacin, and Penicillins   Assessment / Plan:     Visit Diagnoses: 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  without organ or systems involvement (HCC)-she has been experiencing increased stiffness and discomfort as she has been off Enbrel since December 25.  She continues to have fatigue but has no infection.  We discussed resuming  Enbrel.  High risk medication use - Enbrel SureClick 50 mg every 10 days, discontinued methotrexate & folic acid in May 2021 -her labs in November were unremarkable except for mild elevation of creatinine.  We will check labs today and then every 3 months to monitor for drug toxicity.  Plan: CBC with Differential/Platelet, COMPLETE METABOLIC PANEL WITH GFR, QuantiFERON-TB Gold Plus  Iritis-no recent flare.  Sjogren's syndrome with keratoconjunctivitis sicca (HCC)-she continues to have sicca symptoms for which she has been using over-the-counter products.  Primary osteoarthritis of both knees-she had good response to knee joint injections.  DDD (degenerative disc disease), lumbar - followed by Dr. Otelia Sergeant and Dr. Alvester Morin.  History of osteoporosis - She had a recent DEXA ordered by Dr. Evlyn Kanner.  She previously was on fosamax followed by Prolia x2 doses few years back.  I have advised her to get the DEXA results from Dr. Evlyn Kanner.  Dr. Evlyn Kanner wants her to get Prolia from our office.  History of gastroesophageal reflux (GERD)-  Other fatigue  History of migraine-patient complains of frequent migraines.  She states she was seen by neurologist yesterday and was given a cortisone shot.  Her blood sugars have been running high.  History of diabetes mellitus  History of depression  COVID-19 virus infection - on 06/14/20, tested negative on 06/28/20.  She is fully vaccinated against COVID-19.  She had total of 3 doses.  I have advised her to get her booster 6 months after her last dose.  Use of mask, social distancing and hand hygiene was emphasized.  Orders: Orders Placed This Encounter  Procedures  . CBC with Differential/Platelet  . COMPLETE METABOLIC PANEL WITH GFR  .  QuantiFERON-TB Gold Plus   No orders of the defined types were placed in this encounter.    Follow-Up Instructions: Return in about 3 months (around 10/15/2020) for Rheumatoid arthritis, Osteoarthritis, Osteoporosis.   Pollyann Savoy, MD  Note - This record has been created using Animal nutritionist.  Chart creation errors have been sought, but may not always  have been located. Such creation errors do not reflect on  the standard of medical care.

## 2020-07-16 DIAGNOSIS — M542 Cervicalgia: Secondary | ICD-10-CM | POA: Diagnosis not present

## 2020-07-16 DIAGNOSIS — G43719 Chronic migraine without aura, intractable, without status migrainosus: Secondary | ICD-10-CM | POA: Diagnosis not present

## 2020-07-16 DIAGNOSIS — M791 Myalgia, unspecified site: Secondary | ICD-10-CM | POA: Diagnosis not present

## 2020-07-16 DIAGNOSIS — G518 Other disorders of facial nerve: Secondary | ICD-10-CM | POA: Diagnosis not present

## 2020-07-17 ENCOUNTER — Other Ambulatory Visit: Payer: Self-pay

## 2020-07-17 ENCOUNTER — Other Ambulatory Visit: Payer: Self-pay | Admitting: *Deleted

## 2020-07-17 ENCOUNTER — Ambulatory Visit: Payer: Medicare Other | Admitting: Rheumatology

## 2020-07-17 ENCOUNTER — Encounter: Payer: Self-pay | Admitting: Rheumatology

## 2020-07-17 VITALS — BP 115/68 | HR 85 | Resp 13 | Ht 64.0 in | Wt 122.2 lb

## 2020-07-17 DIAGNOSIS — M51369 Other intervertebral disc degeneration, lumbar region without mention of lumbar back pain or lower extremity pain: Secondary | ICD-10-CM

## 2020-07-17 DIAGNOSIS — Z111 Encounter for screening for respiratory tuberculosis: Secondary | ICD-10-CM | POA: Diagnosis not present

## 2020-07-17 DIAGNOSIS — M3501 Sicca syndrome with keratoconjunctivitis: Secondary | ICD-10-CM | POA: Diagnosis not present

## 2020-07-17 DIAGNOSIS — Z79899 Other long term (current) drug therapy: Secondary | ICD-10-CM | POA: Diagnosis not present

## 2020-07-17 DIAGNOSIS — U071 COVID-19: Secondary | ICD-10-CM

## 2020-07-17 DIAGNOSIS — Z8719 Personal history of other diseases of the digestive system: Secondary | ICD-10-CM

## 2020-07-17 DIAGNOSIS — Z8659 Personal history of other mental and behavioral disorders: Secondary | ICD-10-CM

## 2020-07-17 DIAGNOSIS — M0579 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 without organ or systems involvement: Secondary | ICD-10-CM

## 2020-07-17 DIAGNOSIS — M170 Bilateral primary osteoarthritis of knee: Secondary | ICD-10-CM

## 2020-07-17 DIAGNOSIS — Z8669 Personal history of other diseases of the nervous system and sense organs: Secondary | ICD-10-CM

## 2020-07-17 DIAGNOSIS — H209 Unspecified iridocyclitis: Secondary | ICD-10-CM

## 2020-07-17 DIAGNOSIS — R5383 Other fatigue: Secondary | ICD-10-CM

## 2020-07-17 DIAGNOSIS — Z8639 Personal history of other endocrine, nutritional and metabolic disease: Secondary | ICD-10-CM

## 2020-07-17 DIAGNOSIS — M65312 Trigger thumb, left thumb: Secondary | ICD-10-CM

## 2020-07-17 DIAGNOSIS — Z8739 Personal history of other diseases of the musculoskeletal system and connective tissue: Secondary | ICD-10-CM

## 2020-07-17 DIAGNOSIS — M5136 Other intervertebral disc degeneration, lumbar region: Secondary | ICD-10-CM

## 2020-07-17 NOTE — Patient Instructions (Signed)
Standing Labs We placed an order today for your standing lab work.   Please have your standing labs drawn in April and every 3 months  If possible, please have your labs drawn 2 weeks prior to your appointment so that the provider can discuss your results at your appointment.  We have open lab daily Monday through Thursday from 8:30-12:30 PM and 1:30-4:30 PM and Friday from 8:30-12:30 PM and 1:30-4:00 PM at the office of Dr. Jaydian Santana, [facility].   Please be advised, all patients with office appointments requiring lab work will take precedents over walk-in lab work.  If possible, please come for your lab work on Monday and Friday afternoons, as you may experience shorter wait times. The office is located at [address], [address], [place], [place] [address] No appointment is necessary.   Labs are drawn by [facility]. Please bring your co-pay at the time of your lab draw.  You may receive a bill from [facility] for your lab work.  If you wish to have your labs drawn at another location, please call the office 24 hours in advance to send orders.  If you have any questions regarding directions or hours of operation,  please call [phone].   As a reminder, please drink plenty of water prior to coming for your lab work. Thanks!  

## 2020-07-18 ENCOUNTER — Other Ambulatory Visit: Payer: Self-pay | Admitting: *Deleted

## 2020-07-18 DIAGNOSIS — Z79899 Other long term (current) drug therapy: Secondary | ICD-10-CM

## 2020-07-18 NOTE — Telephone Encounter (Signed)
Patient states at her appointment on 02/14/2020 she discussed some increased pain she was having and increasing her Enbrel from every 10 days back to taking it weekly. In the note from yesterday 07/17/2020 it still states patient it taking the Enbrel every 10 days. Patient would like to know if she can increase her Enbrel to weekly and if so if we can send in a prescription to [facility] with those directions. Please advise.

## 2020-07-18 NOTE — Progress Notes (Signed)
Glucose is elevated.  Patient had a steroid injection 1 day prior to the labs.  Creatinine was high but is stable.  CBC is normal.  Please forward labs to her PCP.

## 2020-07-18 NOTE — Telephone Encounter (Signed)
-----   Message from Pollyann Savoy, MD sent at 07/18/2020  2:32 PM EST ----- Glucose is elevated.  Patient had a steroid injection 1 day prior to the labs.  Creatinine was high but is stable.  CBC is normal.  Please forward labs to her PCP.

## 2020-07-19 LAB — CBC WITH DIFFERENTIAL/PLATELET
Absolute Monocytes: 476 cells/uL (ref 200–950)
Basophils Absolute: 73 cells/uL (ref 0–200)
Basophils Relative: 1.2 %
Eosinophils Absolute: 403 cells/uL (ref 15–500)
Eosinophils Relative: 6.6 %
HCT: 39.3 % (ref 35.0–45.0)
Hemoglobin: 13 g/dL (ref 11.7–15.5)
Lymphs Abs: 1861 cells/uL (ref 850–3900)
MCH: 30 pg (ref 27.0–33.0)
MCHC: 33.1 g/dL (ref 32.0–36.0)
MCV: 90.8 fL (ref 80.0–100.0)
MPV: 13.6 fL — ABNORMAL HIGH (ref 7.5–12.5)
Monocytes Relative: 7.8 %
Neutro Abs: 3288 cells/uL (ref 1500–7800)
Neutrophils Relative %: 53.9 %
Platelets: 228 10*3/uL (ref 140–400)
RBC: 4.33 10*6/uL (ref 3.80–5.10)
RDW: 13.1 % (ref 11.0–15.0)
Total Lymphocyte: 30.5 %
WBC: 6.1 10*3/uL (ref 3.8–10.8)

## 2020-07-19 LAB — COMPLETE METABOLIC PANEL WITH GFR
AG Ratio: 1.3 (calc) (ref 1.0–2.5)
ALT: 18 U/L (ref 6–29)
AST: 24 U/L (ref 10–35)
Albumin: 3.8 g/dL (ref 3.6–5.1)
Alkaline phosphatase (APISO): 70 U/L (ref 37–153)
BUN/Creatinine Ratio: 16 (calc) (ref 6–22)
BUN: 18 mg/dL (ref 7–25)
CO2: 29 mmol/L (ref 20–32)
Calcium: 9.9 mg/dL (ref 8.6–10.4)
Chloride: 104 mmol/L (ref 98–110)
Creat: 1.1 mg/dL — ABNORMAL HIGH (ref 0.50–0.99)
GFR, Est African American: 62 mL/min/{1.73_m2} (ref >=60)
GFR, Est Non African American: 53 mL/min/{1.73_m2} — ABNORMAL LOW (ref >=60)
Globulin: 2.9 g/dL (calc) (ref 1.9–3.7)
Glucose, Bld: 224 mg/dL — ABNORMAL HIGH (ref 65–99)
Potassium: 4 mmol/L (ref 3.5–5.3)
Sodium: 139 mmol/L (ref 135–146)
Total Bilirubin: 0.5 mg/dL (ref 0.2–1.2)
Total Protein: 6.7 g/dL (ref 6.1–8.1)

## 2020-07-19 LAB — QUANTIFERON-TB GOLD PLUS
Mitogen-NIL: >10 IU/mL
NIL: 0.07 IU/mL
QuantiFERON-TB Gold Plus: NEGATIVE
TB1-NIL: <0 IU/mL
TB2-NIL: <0 IU/mL

## 2020-07-19 NOTE — Telephone Encounter (Signed)
It is okay to change Enbrel 50 mg subcu weekly.  Please send the prescription to [facility].

## 2020-07-21 MED ORDER — ENBREL SURECLICK 50 MG/ML ~~LOC~~ SOAJ: SUBCUTANEOUS | 0 refills | Status: DC

## 2020-07-21 NOTE — Addendum Note (Signed)
Addended by: Audrie Lia on: 07/21/2020 09:16 AM   Modules accepted: Orders

## 2020-07-23 ENCOUNTER — Other Ambulatory Visit: Payer: Self-pay

## 2020-07-23 ENCOUNTER — Ambulatory Visit: Payer: Medicare Other | Admitting: Physical Medicine and Rehabilitation

## 2020-07-23 ENCOUNTER — Telehealth: Payer: Self-pay | Admitting: Physical Medicine and Rehabilitation

## 2020-07-23 ENCOUNTER — Encounter: Payer: Self-pay | Admitting: Physical Medicine and Rehabilitation

## 2020-07-23 VITALS — BP 134/72 | HR 89

## 2020-07-23 DIAGNOSIS — M4186 Other forms of scoliosis, lumbar region: Secondary | ICD-10-CM

## 2020-07-23 DIAGNOSIS — G8929 Other chronic pain: Secondary | ICD-10-CM

## 2020-07-23 DIAGNOSIS — M545 Low back pain, unspecified: Secondary | ICD-10-CM | POA: Diagnosis not present

## 2020-07-23 DIAGNOSIS — M0579 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 without organ or systems involvement: Secondary | ICD-10-CM | POA: Diagnosis not present

## 2020-07-23 DIAGNOSIS — M47816 Spondylosis without myelopathy or radiculopathy, lumbar region: Secondary | ICD-10-CM

## 2020-07-23 NOTE — Telephone Encounter (Signed)
Pt not req Auth#. 

## 2020-07-23 NOTE — Progress Notes (Unsigned)
  Numeric Pain Rating Scale and Functional Assessment Average Pain 7 Pain Right Now 6 My pain is constant and aching Pain is worse with: sleeping Pain improves with: pacing activities   In the last MONTH (on 0-10 scale) has pain interfered with the following?  1. General activity like being  able to carry out your everyday physical activities such as walking, climbing stairs, carrying groceries, or moving a chair?  Rating(7)  2. Relation with others like being able to carry out your usual social activities and roles such as  activities at home, at work and in your community. Rating(7)  3. Enjoyment of life such that you have  been bothered by emotional problems such as feeling anxious, depressed or irritable?  Rating(7)

## 2020-07-23 NOTE — Telephone Encounter (Signed)
Is auth needed for bilateral L5-S1 MBB? Scheduled for 2/10 with driver.

## 2020-07-24 ENCOUNTER — Encounter: Payer: Self-pay | Admitting: Physical Medicine and Rehabilitation

## 2020-07-24 DIAGNOSIS — M419 Scoliosis, unspecified: Secondary | ICD-10-CM | POA: Insufficient documentation

## 2020-07-24 DIAGNOSIS — G8929 Other chronic pain: Secondary | ICD-10-CM | POA: Insufficient documentation

## 2020-07-24 DIAGNOSIS — M545 Low back pain, unspecified: Secondary | ICD-10-CM | POA: Insufficient documentation

## 2020-07-24 DIAGNOSIS — M47816 Spondylosis without myelopathy or radiculopathy, lumbar region: Secondary | ICD-10-CM | POA: Insufficient documentation

## 2020-07-24 NOTE — Progress Notes (Signed)
[name] - 65 y.o. female MRN [id number]  Date of birth: 1956-02-03  Office Visit Note: Visit Date: 07/23/2020 PCP: Adrian Prince, MD Referred by: Adrian Prince, MD  Subjective: Chief Complaint  Patient presents with  . Lower Back - Pain   HPI: Becky [name] is a 65 y.o. female who comes in today At the request of Dr. Vira Browns for evaluation management of chronic worsening severe axial low back pain.  Her history with him can be reviewed fully in his notes and we have seen her on several occasions through his direction.  Most recently we completed radiofrequency ablation on the right at L1-2 facet joint.  During that time when we would see her she really had more axial low back pain more than upper mid back pain.  She occasionally did have pain at the thoracolumbar junction.  MRI evidence showed significant degenerative disc changes at L1 with a rightward curvature focused at that level.  Somewhat of a collapse at that point.  Some foraminal narrowing.  She did well with medial branch blocks and to some degree he was having more than 50% relief so we did complete ablation of that one specific joint and she reports maybe 2 weeks of relief but then after that increase pain.  We do use some steroid medication after the ablation and she may have just gotten some relief from that.  Her biggest pain complaint is really right more than left with bilateral low back pain worse with going from sit to stand and with standing.  Her case is complicated by insulin-dependent diabetes as well as rheumatoid arthritis.  She rates her average pain is a 7 out of 10 constant and aching pain.  It is worse with trying to sleep at night as well.  It does affect her activities of daily living.  She denies any radicular leg pain or paresthesia.  She has had no prior history of lumbar surgery.  She has tried and failed all manner of conservative care including medications as well as physical therapy etc.  She  continues on home exercise program and continues to walk for exercise.  Medications for her pain in general include Enbrel, Flexeril, Cymbalta and Topamax.  Review of Systems  Musculoskeletal: Positive for back pain.  All other systems reviewed and are negative.  Otherwise per HPI.  Assessment & Plan: Visit Diagnoses:    ICD-10-CM   1. Chronic bilateral low back pain without sciatica  M54.50    G89.29   2. Spondylosis without myelopathy or radiculopathy, lumbar region  M47.816   3. Other form of scoliosis of lumbar spine  M41.86   4. 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 without organ or systems involvement (HCC)  M05.79      Plan: Findings:  Chronic worsening severe recalcitrant axial low back pain somewhat more right than left no referral into the hips or buttock area.  Is clearly present along the lumbar lumbar sacral junction as opposed to the thoracolumbar junction.  We did go over the MRI with the patient today and is reviewed below.  She does have findings of degenerative disc changes with disc height loss and somewhat of a collapse on the right at L1-2 with scoliosis there.  However again her pain is a lot lower than this and is pretty classic for facet mediated low back pain.  Not really indicated on the report but reviewing the images shows a very small grade 1 listhesis of L5  on S1.  There seems to be to me more findings at L4-5 and L5-S1 from a facet standpoint and indicated by the report.  Again those images reviewed with the patient.  I think the next step would be diagnostic medial branch blocks without any steroid at L4-5 and L5-S1.  This would be utilized with a pain diary and we discussed with her at length to try to decide if this really is a pain source.  Alternative treatment if that did not help would be epidural injection more of the mid to upper lumbar spine to see if there was any referral pattern from the degenerative changes.  She has some  myofascial pain as well.  She could ultimately be a candidate for spinal cord stimulator trial.    Meds & Orders: No orders of the defined types were placed in this encounter.  No orders of the defined types were placed in this encounter.   Follow-up: Return for Bilateral L4-5 and L5-S1 medial branch blocks with pain diary.   Procedures: No procedures performed      Clinical History: Chronic low back pain and radiculopathy, worsening  EXAM: MRI LUMBAR SPINE WITHOUT CONTRAST  TECHNIQUE: Multiplanar, multisequence MR imaging of the lumbar spine was performed. No intravenous contrast was administered.  COMPARISON:  None.  FINDINGS: Segmentation:  Standard.  Alignment:  No significant anteroposterior listhesis.  Vertebrae: Vertebral body heights are maintained apart from degenerative endplate irregularity primarily at L1-L2. There are chronic appearing degenerative endplate marrow changes at this level.  Conus medullaris and cauda equina: Conus extends to the L1-L2 level. Conus and cauda equina appear normal.  Paraspinal and other soft tissues: Unremarkable.  Disc levels:  L1-L2: Disc bulge with endplate osteophytic ridging eccentric to the right. No canal stenosis. Mild narrowing of the right lateral recess. Moderate right foraminal stenosis. No left foraminal stenosis.  L2-L3:  Minimal disc bulge.  No canal or foraminal stenosis.  L3-L4: Minimal disc bulge. Mild facet arthropathy with ligamentum flavum infolding. No canal or foraminal stenosis.  L4-L5: Minimal disc bulge. Mild facet arthropathy with ligamentum flavum infolding. No canal or foraminal stenosis.  L5-S1: Disc bulge with endplate osteophytic ridging eccentric to the left. Mild facet arthropathy. No significant canal stenosis. Minor right and mild left foraminal stenosis.  IMPRESSION: Overall mild degenerative changes as detailed above, greatest at L1-L2.   Electronically  Signed   By: Guadlupe Spanish M.D.   On: 01/07/2020 09:50   She reports that she has never smoked. She has never used smokeless tobacco. No results for input(s): HGBA1C, LABURIC in the last 8760 hours.  Objective:  VS:  HT:    WT:   BMI:     BP:134/72  HR:89bpm  TEMP: ( )  RESP:  Physical Exam Vitals and nursing note reviewed.  Constitutional:      General: She is not in acute distress.    Appearance: Normal appearance. She is normal weight. She is not ill-appearing.  HENT:     Head: Normocephalic and atraumatic.     Right Ear: External ear normal.     Left Ear: External ear normal.  Eyes:     Extraocular Movements: Extraocular movements intact.  Cardiovascular:     Rate and Rhythm: Normal rate.     Pulses: Normal pulses.  Pulmonary:     Effort: Pulmonary effort is normal. No respiratory distress.  Abdominal:     General: There is no distension.     Palpations: Abdomen is soft.  Musculoskeletal:  General: Tenderness present. No deformity.     Cervical back: Neck supple. Tenderness present.     Right lower leg: No edema.     Left lower leg: No edema.     Comments: Inspection shows thoracolumbar curve to the right.  There is some paraspinal taut bands particularly down through the lower spine and quadratus lumborum.  This reproduces some of the pain.  She has concordant back pain with extension and lumbar facet loading.  She has no pain with hip rotation.  Patient has good distal strength with no pain over the greater trochanters.  No clonus or focal weakness.  Skin:    Findings: No erythema, lesion or rash.  Neurological:     General: No focal deficit present.     Mental Status: She is alert and oriented to person, place, and time.     Cranial Nerves: No cranial nerve deficit.     Sensory: No sensory deficit.     Motor: No weakness or abnormal muscle tone.     Coordination: Coordination normal.     Gait: Gait normal.  Psychiatric:        Mood and Affect: Mood  normal.        Behavior: Behavior normal.     Ortho Exam  Imaging: No results found.  Past Medical/Family/Surgical/Social History: Medications & Allergies reviewed per EMR, new medications updated. Patient Active Problem List   Diagnosis Date Noted  . Scoliosis 07/24/2020  . Spondylosis without myelopathy or radiculopathy, lumbar region 07/24/2020  . Chronic bilateral low back pain without sciatica 07/24/2020  . Posterior vitreous detachment of right eye 03/18/2020  . 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 Stable treated proliferative diabetic retinopathy of right eye determined by examination associated with type 2 diabetes mellitus (HCC) 03/18/2020  . History of vitrectomy 03/18/2020  . Complicated UTI (urinary tract infection) 12/13/2018  . Failure of outpatient treatment 12/11/2018  . Acute metabolic encephalopathy 12/11/2018  . Fever 12/10/2018  . Breast cancer screening, high risk patient 05/09/2018  . Eosinophilia 01/31/2017  . Iritis 01/04/2017  . History of joint swelling 08/24/2016  . Osteoarthritis of knees, bilateral 08/24/2016  . History of migraine 08/24/2016  . History of depression 08/24/2016  . History of osteoporosis 08/24/2016  . DDD (degenerative disc disease), lumbar 08/24/2016  . High risk medication use 08/24/2016  . Trigger finger, left middle finger 08/24/2016  . Trigger finger, right ring finger 08/24/2016  . 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 Increased risk of breast cancer 07/06/2015  . IDDM (insulin dependent diabetes mellitus) 07/06/2015  . 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 Sjogren's syndrome (HCC) 07/06/2015  . Genetic testing 05/12/2015  . Family history of breast cancer 04/15/2015  . Fibrocystic breast disease 10/11/2012  . GERD  10/01/2009  . Chest pain with moderate risk for cardiac etiology 09/02/2009  . DYSPHAGIA 09/02/2009  . ANXIETY 08/15/2009  . ULCER-GASTRIC 08/15/2009  . CONSTIPATION 08/15/2009  . SLOW TRANSIT CONSTIPATION 08/15/2009  . FLATULENCE ERUCTATION AND GAS PAIN 08/15/2009   Past Medical History:  Diagnosis Date  . Abnormal ECG   . Anemia   . Anxiety   . Cataract    . Cateracts bil eyes removed  . GERD (gastroesophageal reflux disease)   . Heart murmur   . History of COVID-19 06/14/2020  . Hypercholesterolemia   .  IDDM (insulin dependent diabetes mellitus) 1969  . Migraine headache   . Neuromuscular disorder (HCC)    bil legs & feet - neuropathy  . Neuropathy   . Osteoporosis    10/2012  . Otosclerosis   . RA (rheumatoid arthritis) (HCC) 2000  . S/P cardiac cath August 2013   Normal coronaries and normal LV function.  . Sjoegren syndrome    Family History  Problem Relation Age of Onset  . Breast cancer Mother 90  . Colon polyps Mother        unspecified number  . Uterine cancer Mother        metastatic  . Breast cancer Maternal Grandmother 102  . Stroke Father 9  . Rheum arthritis Sister   . Breast cancer Sister 48       lump; (-)GT (breast/ova ca panel, Gene[facility]x)  . Skin cancer Maternal Grandfather        unspecified type  . Prostate cancer Maternal Grandfather        dx. late 6s  . Breast cancer Other        dx. in their late 90s  . Breast cancer Other        dx. 78-79  . Breast cancer Cousin        bilateral; dx. late 71s  . Colon cancer Neg Hx   . Esophageal cancer Neg Hx   . Rectal cancer Neg Hx   . Stomach cancer Neg Hx    Past Surgical History:  Procedure Laterality Date  . back ablation  03/2020  . BREAST LUMPECTOMY  1995  . CARPAL TUNNEL RELEASE  2000  . CATARACT EXTRACTION Right 09-05-12  . CATARACT EXTRACTION Left 3/15  . CESAREAN SECTION  1987  . COLONOSCOPY    . EYE SURGERY     diabetic retinopathy   . HYSTEROSCOPY  02/2001   and D & C  .  LYMPH NODE DISSECTION  2002  . TOE SURGERY  2000   bone removed   Social History   Occupational History  . Occupation: disability    Employer: UNEMPLOYED  Tobacco Use  . Smoking status: Never Smoker  . Smokeless tobacco: Never Used  Vaping Use  . Vaping Use: Never used  Substance and Sexual Activity  . Alcohol use: Yes    Alcohol/week: 4.0 standard drinks    Types: 2 Shots of liquor, 2 Standard drinks or equivalent per week    Comment: maybe 1-2 cocktails per wk  . Drug use: No  . Sexual activity: Yes    Partners: Male    Birth control/protection: Post-menopausal    Comment: vasectomy

## 2020-07-29 ENCOUNTER — Other Ambulatory Visit: Payer: Self-pay

## 2020-07-29 ENCOUNTER — Ambulatory Visit
Admission: RE | Admit: 2020-07-29 | Discharge: 2020-07-29 | Disposition: A | Discharge status: Home / Self Care | Payer: Medicare Other | Source: Ambulatory Visit | Attending: Oncology | Admitting: Oncology

## 2020-07-29 ENCOUNTER — Other Ambulatory Visit: Payer: Medicare Other

## 2020-07-29 DIAGNOSIS — Z1239 Encounter for other screening for malignant neoplasm of breast: Secondary | ICD-10-CM

## 2020-07-29 DIAGNOSIS — N6489 Other specified disorders of breast: Secondary | ICD-10-CM | POA: Diagnosis not present

## 2020-07-29 MED ORDER — GADOBUTROL 1 MMOL/ML IV SOLN
6.0000 mL | Freq: Once | INTRAVENOUS | Status: AC | PRN
  Administered 2020-07-29: 6 mL via INTRAVENOUS

## 2020-07-31 ENCOUNTER — Encounter: Payer: Self-pay | Admitting: Physical Medicine and Rehabilitation

## 2020-07-31 ENCOUNTER — Other Ambulatory Visit: Payer: Self-pay

## 2020-07-31 ENCOUNTER — Ambulatory Visit: Payer: Medicare Other | Admitting: Physical Medicine and Rehabilitation

## 2020-07-31 ENCOUNTER — Ambulatory Visit: Payer: Self-pay

## 2020-07-31 VITALS — BP 146/63 | HR 88

## 2020-07-31 DIAGNOSIS — M47816 Spondylosis without myelopathy or radiculopathy, lumbar region: Secondary | ICD-10-CM

## 2020-07-31 MED ORDER — BUPIVACAINE HCL 0.5 % IJ SOLN: 3.0000 mL | Freq: Once | INTRAMUSCULAR | Status: DC

## 2020-07-31 NOTE — Patient Instructions (Signed)

## 2020-07-31 NOTE — Progress Notes (Signed)
Pt state lower back pain. Pt state laying in bed at night makes the pain worse. Pt state she use heating pads to help ease the pain.  Numeric Pain Rating Scale and Functional Assessment Average Pain 7   In the last MONTH (on 0-10 scale) has pain interfered with the following?  1. General activity like being  able to carry out your everyday physical activities such as walking, climbing stairs, carrying groceries, or moving a chair?  Rating(9)   +Driver, -BT, -Dye Allergies.

## 2020-07-31 NOTE — Progress Notes (Signed)
Becky [name] - 65 y.o. female MRN [id number]  Date of birth: 10/24/1955  Office Visit Note: Visit Date: 07/31/2020 PCP: Adrian Prince, MD Referred by: Adrian Prince, MD  Subjective: Chief Complaint  Patient presents with  . Lower Back - Pain   HPI:  Becky [name] is a 65 y.o. female who comes in today for planned Bilateral  L5-S1 Lumbar facet/medial branch block with fluoroscopic guidance.  The patient has failed conservative care including home exercise, medications, time and activity modification.  This injection will be diagnostic and hopefully therapeutic.  Please see requesting physician notes for further details and justification.  Exam has shown concordant pain with facet joint loading.  MRI reviewed with images and spine model.  MRI reviewed in the note below.   ROS Otherwise per HPI.  Assessment & Plan: Visit Diagnoses:    ICD-10-CM   1. Spondylosis without myelopathy or radiculopathy, lumbar region  M47.816 XR C-ARM NO REPORT    Facet Injection    bupivacaine (MARCAINE) 0.5 % (with pres) injection 3 mL    Plan: No additional findings.   Meds & Orders:  Meds ordered this encounter  Medications  . bupivacaine (MARCAINE) 0.5 % (with pres) injection 3 mL    Orders Placed This Encounter  Procedures  . Facet Injection  . XR C-ARM NO REPORT    Follow-up: Return for Review Pain Diary.   Procedures: No procedures performed  Lumbar Diagnostic Facet Joint Nerve Block with Fluoroscopic Guidance   [name]      Date of Birth: 05-17-1956 MRN: [id number] PCP: Adrian Prince, MD      Visit Date: 07/31/2020   Universal Protocol:    Date/Time: 02/10/221:14 PM  Consent Given By: the patient  Position: PRONE  Additional Comments: Vital signs were monitored before and after the procedure. Patient was prepped and draped in the usual sterile fashion. The correct patient, procedure, and site was verified.   Injection Procedure Details:    Procedure diagnoses:  1. Spondylosis without myelopathy or radiculopathy, lumbar region      Meds Administered:  Meds ordered this encounter  Medications  . bupivacaine (MARCAINE) 0.5 % (with pres) injection 3 mL     Laterality: Bilateral  Location/Site: L5-S1, L4 medial branch and L5 dorsal ramus  Needle: 5.0 in., 25 ga.  Short bevel or Quincke spinal needle  Needle Placement: Oblique pedical  Findings:   -Comments: There was excellent flow of contrast along the articular pillars without intravascular flow.  Procedure Details: The fluoroscope beam is vertically oriented in AP and then obliqued 15 to 20 degrees to the ipsilateral side of the desired nerve to achieve the "Scotty dog" appearance.  The skin over the target area of the junction of the superior articulating process and the transverse process (sacral ala if blocking the L5 dorsal rami) was locally anesthetized with a 1 ml volume of 1% Lidocaine without Epinephrine.  The spinal needle was inserted and advanced in a trajectory view down to the target.   After contact with periosteum and negative aspirate for blood and CSF, correct placement without intravascular or epidural spread was confirmed by injecting 0.5 ml. of Isovue-250.  A spot radiograph was obtained of this image.    Next, a 0.5 ml. volume of the injectate described above was injected. The needle was then redirected to the other facet joint nerves mentioned above if needed.  Prior to the procedure, the patient was given a Pain Diary which was completed  for baseline measurements.  After the procedure, the patient rated their pain every 30 minutes and will continue rating at this frequency for a total of 5 hours.  The patient has been asked to complete the Diary and return to [place] by mail, fax or hand delivered as soon as possible.   Additional Comments:  The patient tolerated the procedure well Dressing: 2 x 2 sterile gauze and Band-Aid    Post-procedure  details: Patient was observed during the procedure. Post-procedure instructions were reviewed.  Patient left the clinic in stable condition.    Clinical History: Chronic low back pain and radiculopathy, worsening  EXAM: MRI LUMBAR SPINE WITHOUT CONTRAST  TECHNIQUE: Multiplanar, multisequence MR imaging of the lumbar spine was performed. No intravenous contrast was administered.  COMPARISON:  None.  FINDINGS: Segmentation:  Standard.  Alignment:  No significant anteroposterior listhesis.  Vertebrae: Vertebral body heights are maintained apart from degenerative endplate irregularity primarily at L1-L2. There are chronic appearing degenerative endplate marrow changes at this level.  Conus medullaris and cauda equina: Conus extends to the L1-L2 level. Conus and cauda equina appear normal.  Paraspinal and other soft tissues: Unremarkable.  Disc levels:  L1-L2: Disc bulge with endplate osteophytic ridging eccentric to the right. No canal stenosis. Mild narrowing of the right lateral recess. Moderate right foraminal stenosis. No left foraminal stenosis.  L2-L3:  Minimal disc bulge.  No canal or foraminal stenosis.  L3-L4: Minimal disc bulge. Mild facet arthropathy with ligamentum flavum infolding. No canal or foraminal stenosis.  L4-L5: Minimal disc bulge. Mild facet arthropathy with ligamentum flavum infolding. No canal or foraminal stenosis.  L5-S1: Disc bulge with endplate osteophytic ridging eccentric to the left. Mild facet arthropathy. No significant canal stenosis. Minor right and mild left foraminal stenosis.  IMPRESSION: Overall mild degenerative changes as detailed above, greatest at L1-L2.   Electronically Signed   By: Guadlupe Spanish M.D.   On: 01/07/2020 09:50     Objective:  VS:  HT:    WT:   BMI:     BP:(!) 146/63  HR:88bpm  TEMP: ( )  RESP:  Physical Exam Vitals and nursing note reviewed.  Constitutional:      General:  She is not in acute distress.    Appearance: Normal appearance. She is not ill-appearing.  HENT:     Head: Normocephalic and atraumatic.     Right Ear: External ear normal.     Left Ear: External ear normal.  Eyes:     Extraocular Movements: Extraocular movements intact.  Cardiovascular:     Rate and Rhythm: Normal rate.     Pulses: Normal pulses.  Pulmonary:     Effort: Pulmonary effort is normal. No respiratory distress.  Abdominal:     General: There is no distension.     Palpations: Abdomen is soft.  Musculoskeletal:        General: Tenderness present.     Cervical back: Neck supple.     Right lower leg: No edema.     Left lower leg: No edema.     Comments: Patient has good distal strength with no pain over the greater trochanters.  No clonus or focal weakness. Patient somewhat slow to rise from a seated position to full extension.  There is concordant low back pain with facet loading and lumbar spine extension rotation.  There are no definitive trigger points but the patient is somewhat tender across the lower back and PSIS.  There is no pain with hip rotation.  Skin:    Findings: No erythema, lesion or rash.  Neurological:     General: No focal deficit present.     Mental Status: She is alert and oriented to person, place, and time.     Sensory: No sensory deficit.     Motor: No weakness or abnormal muscle tone.     Coordination: Coordination normal.  Psychiatric:        Mood and Affect: Mood normal.        Behavior: Behavior normal.      Imaging: No results found.

## 2020-07-31 NOTE — Procedures (Signed)
Lumbar Diagnostic Facet Joint Nerve Block with Fluoroscopic Guidance   [name]      Date of Birth: 01/09/1956 MRN: [id number] PCP: Adrian Prince, MD      Visit Date: 07/31/2020   Universal Protocol:    Date/Time: 02/10/221:14 PM  Consent Given By: the patient  Position: PRONE  Additional Comments: Vital signs were monitored before and after the procedure. Patient was prepped and draped in the usual sterile fashion. The correct patient, procedure, and site was verified.   Injection Procedure Details:   Procedure diagnoses:  1. Spondylosis without myelopathy or radiculopathy, lumbar region      Meds Administered:  Meds ordered this encounter  Medications  . bupivacaine (MARCAINE) 0.5 % (with pres) injection 3 mL     Laterality: Bilateral  Location/Site: L5-S1, L4 medial branch and L5 dorsal ramus  Needle: 5.0 in., 25 ga.  Short bevel or Quincke spinal needle  Needle Placement: Oblique pedical  Findings:   -Comments: There was excellent flow of contrast along the articular pillars without intravascular flow.  Procedure Details: The fluoroscope beam is vertically oriented in AP and then obliqued 15 to 20 degrees to the ipsilateral side of the desired nerve to achieve the "Scotty dog" appearance.  The skin over the target area of the junction of the superior articulating process and the transverse process (sacral ala if blocking the L5 dorsal rami) was locally anesthetized with a 1 ml volume of 1% Lidocaine without Epinephrine.  The spinal needle was inserted and advanced in a trajectory view down to the target.   After contact with periosteum and negative aspirate for blood and CSF, correct placement without intravascular or epidural spread was confirmed by injecting 0.5 ml. of Isovue-250.  A spot radiograph was obtained of this image.    Next, a 0.5 ml. volume of the injectate described above was injected. The needle was then redirected to the other  facet joint nerves mentioned above if needed.  Prior to the procedure, the patient was given a Pain Diary which was completed for baseline measurements.  After the procedure, the patient rated their pain every 30 minutes and will continue rating at this frequency for a total of 5 hours.  The patient has been asked to complete the Diary and return to [place] by mail, fax or hand delivered as soon as possible.   Additional Comments:  The patient tolerated the procedure well Dressing: 2 x 2 sterile gauze and Band-Aid    Post-procedure details: Patient was observed during the procedure. Post-procedure instructions were reviewed.  Patient left the clinic in stable condition.

## 2020-08-04 DIAGNOSIS — E109 Type 1 diabetes mellitus without complications: Secondary | ICD-10-CM | POA: Diagnosis not present

## 2020-08-04 DIAGNOSIS — Z794 Long term (current) use of insulin: Secondary | ICD-10-CM | POA: Diagnosis not present

## 2020-08-06 ENCOUNTER — Ambulatory Visit: Payer: Medicare Other | Admitting: Obstetrics and Gynecology

## 2020-08-07 ENCOUNTER — Telehealth: Payer: Self-pay

## 2020-08-07 NOTE — Telephone Encounter (Signed)
Pt would like a call back from shena.  She said she sent the email in

## 2020-08-11 NOTE — Telephone Encounter (Signed)
Called pt back and sch 3/1 

## 2020-08-19 ENCOUNTER — Ambulatory Visit: Payer: Self-pay

## 2020-08-19 ENCOUNTER — Ambulatory Visit: Payer: Medicare Other | Admitting: Physical Medicine and Rehabilitation

## 2020-08-19 ENCOUNTER — Encounter: Payer: Self-pay | Admitting: Physical Medicine and Rehabilitation

## 2020-08-19 ENCOUNTER — Other Ambulatory Visit: Payer: Self-pay

## 2020-08-19 VITALS — BP 128/56 | HR 87

## 2020-08-19 DIAGNOSIS — G8929 Other chronic pain: Secondary | ICD-10-CM

## 2020-08-19 DIAGNOSIS — M47816 Spondylosis without myelopathy or radiculopathy, lumbar region: Secondary | ICD-10-CM

## 2020-08-19 DIAGNOSIS — M545 Low back pain, unspecified: Secondary | ICD-10-CM

## 2020-08-19 MED ORDER — BUPIVACAINE HCL 0.25 % IJ SOLN
2.0000 mL | Freq: Once | INTRAMUSCULAR | Status: AC
  Administered 2020-08-19: 2 mL

## 2020-08-19 NOTE — Progress Notes (Signed)
Becky [name] - 65 y.o. female MRN [id number]  Date of birth: 03-19-1956  Office Visit Note: Visit Date: 08/19/2020 PCP: Adrian Prince, MD Referred by: Adrian Prince, MD  Subjective: Chief Complaint  Patient presents with  . Lower Back - Pain   HPI:  [name] is a 65 y.o. female who comes in today for planned repeat Bilateral L5-S1 Lumbar facet/medial branch block with fluoroscopic guidance.  The patient has failed conservative care including home exercise, medications, time and activity modification.  This injection will be diagnostic and hopefully therapeutic.  Please see requesting physician notes for further details and justification.  Exam shows concordant low back pain with facet joint loading and extension. Patient received more than 80% pain relief from prior injection. This would be the second block in a diagnostic double block paradigm.     Referring:Dr. Vira Browns   ROS Otherwise per HPI.  Assessment & Plan: Visit Diagnoses:    ICD-10-CM   1. Spondylosis without myelopathy or radiculopathy, lumbar region  M47.816 XR C-ARM NO REPORT    Facet Injection    bupivacaine (MARCAINE) 0.25 % (with pres) injection 2 mL  2. Chronic bilateral low back pain without sciatica  M54.50 XR C-ARM NO REPORT   G89.29 Facet Injection    bupivacaine (MARCAINE) 0.25 % (with pres) injection 2 mL    Plan: No additional findings.   Meds & Orders:  Meds ordered this encounter  Medications  . bupivacaine (MARCAINE) 0.25 % (with pres) injection 2 mL    Orders Placed This Encounter  Procedures  . Facet Injection  . XR C-ARM NO REPORT    Follow-up: Return for Review Pain Diary.   Procedures: No procedures performed      Clinical History: Chronic low back pain and radiculopathy, worsening  EXAM: MRI LUMBAR SPINE WITHOUT CONTRAST  TECHNIQUE: Multiplanar, multisequence MR imaging of the lumbar spine was performed. No intravenous contrast was  administered.  COMPARISON:  None.  FINDINGS: Segmentation:  Standard.  Alignment:  No significant anteroposterior listhesis.  Vertebrae: Vertebral body heights are maintained apart from degenerative endplate irregularity primarily at L1-L2. There are chronic appearing degenerative endplate marrow changes at this level.  Conus medullaris and cauda equina: Conus extends to the L1-L2 level. Conus and cauda equina appear normal.  Paraspinal and other soft tissues: Unremarkable.  Disc levels:  L1-L2: Disc bulge with endplate osteophytic ridging eccentric to the right. No canal stenosis. Mild narrowing of the right lateral recess. Moderate right foraminal stenosis. No left foraminal stenosis.  L2-L3:  Minimal disc bulge.  No canal or foraminal stenosis.  L3-L4: Minimal disc bulge. Mild facet arthropathy with ligamentum flavum infolding. No canal or foraminal stenosis.  L4-L5: Minimal disc bulge. Mild facet arthropathy with ligamentum flavum infolding. No canal or foraminal stenosis.  L5-S1: Disc bulge with endplate osteophytic ridging eccentric to the left. Mild facet arthropathy. No significant canal stenosis. Minor right and mild left foraminal stenosis.  IMPRESSION: Overall mild degenerative changes as detailed above, greatest at L1-L2.   Electronically Signed   By: Guadlupe Spanish M.D.   On: 01/07/2020 09:50     Objective:  VS:  HT:    WT:   BMI:     BP:(!) 128/56  HR:87bpm  TEMP: ( )  RESP:  Physical Exam Vitals and nursing note reviewed.  Constitutional:      General: She is not in acute distress.    Appearance: Normal appearance. She is not ill-appearing.  HENT:  Head: Normocephalic and atraumatic.     Right Ear: External ear normal.     Left Ear: External ear normal.  Eyes:     Extraocular Movements: Extraocular movements intact.  Cardiovascular:     Rate and Rhythm: Normal rate.     Pulses: Normal pulses.  Pulmonary:      Effort: Pulmonary effort is normal. No respiratory distress.  Abdominal:     General: There is no distension.     Palpations: Abdomen is soft.  Musculoskeletal:        General: Tenderness present.     Cervical back: Neck supple.     Right lower leg: No edema.     Left lower leg: No edema.     Comments: Patient has good distal strength with no pain over the greater trochanters.  No clonus or focal weakness. Patient somewhat slow to rise from a seated position to full extension.  There is concordant low back pain with facet loading and lumbar spine extension rotation.  There are no definitive trigger points but the patient is somewhat tender across the lower back and PSIS.  There is no pain with hip rotation.   Skin:    Findings: No erythema, lesion or rash.  Neurological:     General: No focal deficit present.     Mental Status: She is alert and oriented to person, place, and time.     Sensory: No sensory deficit.     Motor: No weakness or abnormal muscle tone.     Coordination: Coordination normal.  Psychiatric:        Mood and Affect: Mood normal.        Behavior: Behavior normal.      Imaging: No results found.

## 2020-08-19 NOTE — Patient Instructions (Signed)

## 2020-08-19 NOTE — Progress Notes (Signed)
Good relief for several days following first medial branch block. Pain across low back on both sides, but much worse on right.  Numeric Pain Rating Scale and Functional Assessment Average Pain 10   In the last MONTH (on 0-10 scale) has pain interfered with the following?  1. General activity like being  able to carry out your everyday physical activities such as walking, climbing stairs, carrying groceries, or moving a chair?  Rating(7)   +Driver, -BT, -Dye Allergies.

## 2020-08-19 NOTE — Procedures (Signed)
Lumbar Diagnostic Facet Joint Nerve Block with Fluoroscopic Guidance   [name]      Date of Birth: 1956-02-12 MRN: [id number] PCP: Adrian Prince, MD      Visit Date: 08/19/2020   Universal Protocol:    Date/Time: 03/01/221:01 PM  Consent Given By: the patient  Position: PRONE  Additional Comments: Vital signs were monitored before and after the procedure. Patient was prepped and draped in the usual sterile fashion. The correct patient, procedure, and site was verified.   Injection Procedure Details:   Procedure diagnoses:  1. Spondylosis without myelopathy or radiculopathy, lumbar region   2. Chronic bilateral low back pain without sciatica      Meds Administered:  Meds ordered this encounter  Medications  . bupivacaine (MARCAINE) 0.25 % (with pres) injection 2 mL     Laterality: Bilateral  Location/Site: L5-S1, L4 medial branch and L5 dorsal ramus  Needle: 5.0 in., 25 ga.  Short bevel or Quincke spinal needle  Needle Placement: Oblique pedical  Findings:   -Comments: There was excellent flow of contrast along the articular pillars without intravascular flow.  Procedure Details: The fluoroscope beam is vertically oriented in AP and then obliqued 15 to 20 degrees to the ipsilateral side of the desired nerve to achieve the "Scotty dog" appearance.  The skin over the target area of the junction of the superior articulating process and the transverse process (sacral ala if blocking the L5 dorsal rami) was locally anesthetized with a 1 ml volume of 1% Lidocaine without Epinephrine.  The spinal needle was inserted and advanced in a trajectory view down to the target.   After contact with periosteum and negative aspirate for blood and CSF, correct placement without intravascular or epidural spread was confirmed by injecting 0.5 ml. of Isovue-250.  A spot radiograph was obtained of this image.    Next, a 0.5 ml. volume of the injectate described above was  injected. The needle was then redirected to the other facet joint nerves mentioned above if needed.  Prior to the procedure, the patient was given a Pain Diary which was completed for baseline measurements.  After the procedure, the patient rated their pain every 30 minutes and will continue rating at this frequency for a total of 5 hours.  The patient has been asked to complete the Diary and return to [place] by mail, fax or hand delivered as soon as possible.   Additional Comments:  The patient tolerated the procedure well Dressing: 2 x 2 sterile gauze and Band-Aid    Post-procedure details: Patient was observed during the procedure. Post-procedure instructions were reviewed.  Patient left the clinic in stable condition.

## 2020-08-21 ENCOUNTER — Telehealth: Payer: Self-pay

## 2020-08-21 NOTE — Telephone Encounter (Signed)
Schedule OV for [place] to talk over the results

## 2020-08-21 NOTE — Telephone Encounter (Signed)
Pt state she didn't have any relief from what her pain dairy noted.

## 2020-08-22 NOTE — Telephone Encounter (Signed)
Called pt and sch 3/22 °

## 2020-09-09 ENCOUNTER — Ambulatory Visit: Payer: Medicare Other | Admitting: Physical Medicine and Rehabilitation

## 2020-09-09 ENCOUNTER — Other Ambulatory Visit: Payer: Self-pay

## 2020-09-09 ENCOUNTER — Encounter: Payer: Self-pay | Admitting: Physical Medicine and Rehabilitation

## 2020-09-09 VITALS — BP 142/81 | HR 79

## 2020-09-09 DIAGNOSIS — M4186 Other forms of scoliosis, lumbar region: Secondary | ICD-10-CM | POA: Diagnosis not present

## 2020-09-09 DIAGNOSIS — G8929 Other chronic pain: Secondary | ICD-10-CM

## 2020-09-09 DIAGNOSIS — M545 Low back pain, unspecified: Secondary | ICD-10-CM

## 2020-09-09 DIAGNOSIS — M0579 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 without organ or systems involvement: Secondary | ICD-10-CM

## 2020-09-09 DIAGNOSIS — M47816 Spondylosis without myelopathy or radiculopathy, lumbar region: Secondary | ICD-10-CM | POA: Diagnosis not present

## 2020-09-09 NOTE — Progress Notes (Signed)
 .  Numeric Pain Rating Scale and Functional Assessment Average Pain 8   In the last MONTH (on 0-10 scale) has pain interfered with the following?  1. General activity like being  able to carry out your everyday physical activities such as walking, climbing stairs, carrying groceries, or moving a chair?  Rating(8)   

## 2020-09-10 ENCOUNTER — Telehealth: Payer: Self-pay | Admitting: Physical Medicine and Rehabilitation

## 2020-09-10 ENCOUNTER — Encounter: Payer: Self-pay | Admitting: Physical Medicine and Rehabilitation

## 2020-09-10 DIAGNOSIS — G518 Other disorders of facial nerve: Secondary | ICD-10-CM | POA: Diagnosis not present

## 2020-09-10 DIAGNOSIS — G43719 Chronic migraine without aura, intractable, without status migrainosus: Secondary | ICD-10-CM | POA: Diagnosis not present

## 2020-09-10 DIAGNOSIS — M542 Cervicalgia: Secondary | ICD-10-CM | POA: Diagnosis not present

## 2020-09-10 DIAGNOSIS — M791 Myalgia, unspecified site: Secondary | ICD-10-CM | POA: Diagnosis not present

## 2020-09-10 NOTE — Progress Notes (Signed)
Becky [name] - 65 y.o. female MRN [id number]  Date of birth: 1956/05/01  Office Visit Note: Visit Date: 09/09/2020 PCP: Adrian Prince, MD Referred by: Adrian Prince, MD  Subjective: Chief Complaint  Patient presents with  . Lower Back - Pain   HPI: Becky [name] is a 65 y.o. female who comes in today For evaluation management of chronic worsening severe axial low back pain.  Her prior notes can be reviewed fully.  She has undergone 2 diagnostic medial branch blocks at L5-S1.  The first diagnostic medial branch blockade for almost 100% relief and that is documented.  She had sort of an abnormal reaction to the second diagnostic block but did get relief.  She tells me that immediately after the procedure even using the 25-gauge needles she felt some back pain when she left and felt some spasm in the muscles and low back pain worse with laying down not so much standing.  Initially did not feel like there was much relief but then later once that calm down she actually did get probably 70% relief of her normal back pain.  Her case is very complicated with history of type 1 insulin-dependent diabetes with a history of manifestations and complications of that including neuropathy and retinopathy.  She also has history of rheumatoid arthritis.  She is tried and failed all manner of conservative care.  She has been followed by our spine surgeon Dr. Vira Browns and really does not have a surgical spine.  MRI findings showing some curvature with spondylosis and degenerative changes but no high-grade nerve compression or stenosis.  She denies any specific radicular pain or any new symptoms.  Review of Systems  Musculoskeletal: Positive for back pain.  All other systems reviewed and are negative.  Otherwise per HPI.  Assessment & Plan: Visit Diagnoses:    ICD-10-CM   1. Spondylosis without myelopathy or radiculopathy, lumbar region  M47.816   2. Chronic bilateral low back pain without sciatica   M54.50    G89.29   3. Other form of scoliosis of lumbar spine  M41.86   4. 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  Second set of blocks had some aberration initially which I think is myofascial pain.  She has very tight paraspinal musculature.  She also has myofascial trigger points in the upper back as well that may actually be part of the process of why she is having some of her headaches.  Nonetheless the neck step is radiofrequency ablation of the L5-S1 facet joints for more definitive care of her low back pain.  She has tried muscle relaxers in the past without much relief.  Could look at focal massage of the tight musculature versus dry needling.    Meds & Orders: No orders of the defined types were placed in this encounter.  No orders of the defined types were placed in this encounter.   Follow-up: No follow-ups on file.   Procedures: No procedures performed      Clinical History: Chronic low back pain and radiculopathy, worsening  EXAM: MRI LUMBAR SPINE WITHOUT CONTRAST  TECHNIQUE: Multiplanar, multisequence MR imaging of the lumbar spine was performed. No intravenous  contrast was administered.  COMPARISON:  None.  FINDINGS: Segmentation:  Standard.  Alignment:  No significant anteroposterior listhesis.  Vertebrae: Vertebral body heights are maintained apart from degenerative endplate irregularity primarily at L1-L2. There are chronic appearing degenerative endplate marrow changes at this level.  Conus medullaris and cauda equina: Conus extends to the L1-L2 level. Conus and cauda equina appear normal.  Paraspinal and  other soft tissues: Unremarkable.  Disc levels:  L1-L2: Disc bulge with endplate osteophytic ridging eccentric to the right. No canal stenosis. Mild narrowing of the right lateral recess. Moderate right foraminal stenosis. No left foraminal stenosis.  L2-L3:  Minimal disc bulge.  No canal or foraminal stenosis.  L3-L4: Minimal disc bulge. Mild facet arthropathy with ligamentum flavum infolding. No canal or foraminal stenosis.  L4-L5: Minimal disc bulge. Mild facet arthropathy with ligamentum flavum infolding. No canal or foraminal stenosis.  L5-S1: Disc bulge with endplate osteophytic ridging eccentric to the left. Mild facet arthropathy. No significant canal stenosis. Minor right and mild left foraminal stenosis.  IMPRESSION: Overall mild degenerative changes as detailed above, greatest at L1-L2.   Electronically Signed   By: Guadlupe Spanish M.D.   On: 01/07/2020 09:50   She reports that she has never smoked. She has never used smokeless tobacco. No results for input(s): HGBA1C, LABURIC in the last 8760 hours.  Objective:  VS:  HT:    WT:   BMI:     BP:(!) 142/81  HR:79bpm  TEMP: ( )  RESP:  Physical Exam Vitals and nursing note reviewed.  Constitutional:      General: She is not in acute distress.    Appearance: Normal appearance. She is normal weight. She is not ill-appearing.  HENT:     Head: Normocephalic and atraumatic.     Right Ear: External ear normal.     Left Ear: External ear normal.  Eyes:     Extraocular Movements: Extraocular movements intact.  Cardiovascular:     Rate and Rhythm: Normal rate.     Pulses: Normal pulses.  Pulmonary:     Effort: Pulmonary effort is normal. No respiratory distress.  Abdominal:     General: There is no distension.     Palpations: Abdomen is soft.  Musculoskeletal:        General: Tenderness present.     Cervical back: Neck supple.     Right lower leg: No edema.     Left lower leg: No edema.      Comments: Patient has good distal strength with no pain over the greater trochanters.  No clonus or focal weakness. Patient somewhat slow to rise from a seated position to full extension.  There is concordant low back pain with facet loading and lumbar spine extension rotation.  There are no definitive trigger points but the patient is somewhat tender across the lower back and PSIS.  There is no pain with hip rotation.   Skin:    Findings: No erythema, lesion or rash.  Neurological:     General: No focal deficit present.     Mental Status: She is alert and oriented to person, place, and time.     Sensory: No sensory deficit.     Motor: No weakness or abnormal muscle tone.     Coordination: Coordination normal.  Psychiatric:        Mood and Affect: Mood normal.        Behavior: Behavior normal.     Ortho Exam  Imaging: No results found.  Past Medical/Family/Surgical/Social History: Medications & Allergies reviewed per EMR, new medications updated. Patient Active Problem List   Diagnosis Date Noted  . Scoliosis 07/24/2020  . Spondylosis without myelopathy or radiculopathy, lumbar region 07/24/2020  . Chronic bilateral low back pain without sciatica 07/24/2020  . Posterior vitreous detachment of right eye 03/18/2020  . 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 Stable treated proliferative diabetic retinopathy of right eye determined by examination associated with type 2 diabetes mellitus (HCC) 03/18/2020  . History of vitrectomy 03/18/2020  . Complicated UTI (urinary tract infection) 12/13/2018  . Failure of outpatient treatment 12/11/2018  . Acute metabolic encephalopathy 12/11/2018  . Fever 12/10/2018  . Breast cancer screening, high risk patient 05/09/2018  . Eosinophilia 01/31/2017  . Iritis 01/04/2017  . History of joint swelling 08/24/2016  .  Osteoarthritis of knees, bilateral 08/24/2016  . History of migraine 08/24/2016  . History of depression 08/24/2016  . History of osteoporosis 08/24/2016  . DDD (degenerative disc disease), lumbar 08/24/2016  . High risk medication use 08/24/2016  . Trigger finger, left middle finger 08/24/2016  . Trigger finger, right ring finger 08/24/2016  . 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 Increased risk of breast cancer 07/06/2015  . IDDM (insulin dependent diabetes mellitus) 07/06/2015  . 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 Sjogren's syndrome (HCC) 07/06/2015  . Genetic testing 05/12/2015  . Family history of breast cancer 04/15/2015  . Fibrocystic breast disease 10/11/2012  . GERD 10/01/2009  . Chest pain with moderate risk for cardiac etiology 09/02/2009  . DYSPHAGIA 09/02/2009  . ANXIETY 08/15/2009  . ULCER-GASTRIC 08/15/2009  . CONSTIPATION 08/15/2009  . SLOW TRANSIT CONSTIPATION 08/15/2009  . FLATULENCE ERUCTATION AND GAS PAIN 08/15/2009   Past Medical History:  Diagnosis Date  . Abnormal ECG   . Anemia   . Anxiety   . Cataract    . Cateracts bil eyes removed  . GERD (gastroesophageal reflux disease)   . Heart murmur   . History of COVID-19 06/14/2020  . Hypercholesterolemia   . IDDM (insulin dependent diabetes mellitus) 1969  . Migraine headache   . Neuromuscular disorder (HCC)    bil legs & feet - neuropathy  . Neuropathy   . Osteoporosis    10/2012  . Otosclerosis   . RA (rheumatoid arthritis) (HCC) 2000  . S/P cardiac cath August 2013   Normal coronaries and normal LV function.  . Sjoegren syndrome    Family History  Problem Relation Age of Onset  . Breast cancer Mother 80  . Colon polyps Mother        unspecified number  . Uterine cancer Mother        metastatic  . Breast cancer Maternal Grandmother 66  . Stroke Father 19  . Rheum arthritis Sister   . Breast cancer Sister 48        lump; (-)GT (breast/ova ca panel, Gene[facility]x)  . Skin cancer Maternal Grandfather        unspecified type  . Prostate cancer Maternal Grandfather        dx. late 32s  . Breast cancer Other        dx. in their late 66s  . Breast cancer Other        dx. 78-79  . Breast cancer Cousin        bilateral; dx. late 75s  .  Colon cancer Neg Hx   . Esophageal cancer Neg Hx   . Rectal cancer Neg Hx   . Stomach cancer Neg Hx    Past Surgical History:  Procedure Laterality Date  . back ablation  03/2020  . BREAST LUMPECTOMY  1995  . CARPAL TUNNEL RELEASE  2000  . CATARACT EXTRACTION Right 09-05-12  . CATARACT EXTRACTION Left 3/15  . CESAREAN SECTION  1987  . COLONOSCOPY    . EYE SURGERY     diabetic retinopathy   . HYSTEROSCOPY  02/2001   and D & C  . LYMPH NODE DISSECTION  2002  . TOE SURGERY  2000   bone removed   Social History   Occupational History  . Occupation: disability    Employer: UNEMPLOYED  Tobacco Use  . Smoking status: Never Smoker  . Smokeless tobacco: Never Used  Vaping Use  . Vaping Use: Never used  Substance and Sexual Activity  . Alcohol use: Yes    Alcohol/week: 4.0 standard drinks    Types: 2 Shots of liquor, 2 Standard drinks or equivalent per week    Comment: maybe 1-2 cocktails per wk  . Drug use: No  . Sexual activity: Yes    Partners: Male    Birth control/protection: Post-menopausal    Comment: vasectomy

## 2020-09-10 NOTE — Telephone Encounter (Signed)
Needs auth and scheduling for bilateral L5-S1 RFA. Can do both sides same day.

## 2020-09-15 ENCOUNTER — Telehealth: Payer: Self-pay

## 2020-09-15 NOTE — Telephone Encounter (Signed)
Left message #1

## 2020-09-15 NOTE — Telephone Encounter (Signed)
Scheduled for 4/13 at 1530 with driver.

## 2020-09-15 NOTE — Telephone Encounter (Signed)
Pt not req Auth. REF#[id number]

## 2020-09-15 NOTE — Telephone Encounter (Signed)
Patient called she is returning your vm call back:[phone]

## 2020-09-15 NOTE — Telephone Encounter (Signed)
See previous message

## 2020-09-23 DIAGNOSIS — E785 Hyperlipidemia, unspecified: Secondary | ICD-10-CM | POA: Diagnosis not present

## 2020-09-23 DIAGNOSIS — N1831 Chronic kidney disease, stage 3a: Secondary | ICD-10-CM | POA: Diagnosis not present

## 2020-09-23 DIAGNOSIS — E103493 Type 1 diabetes mellitus with severe nonproliferative diabetic retinopathy without macular edema, bilateral: Secondary | ICD-10-CM | POA: Diagnosis not present

## 2020-09-23 DIAGNOSIS — E1040 Type 1 diabetes mellitus with diabetic neuropathy, unspecified: Secondary | ICD-10-CM | POA: Diagnosis not present

## 2020-10-01 ENCOUNTER — Encounter: Payer: Medicare Other | Admitting: Physical Medicine and Rehabilitation

## 2020-10-02 NOTE — Progress Notes (Signed)
Office Visit Note  [name]             Date of Birth: 1956-02-23           MRN: [id number]             PCP: Adrian Prince, MD Referring: Adrian Prince, MD Visit Date: 10/16/2020 Occupation: @  Subjective:  Left trigger thumb   History of Present Illness: Becky [name] is a 65 y.o. female with history of rheumatoid arthritis, sjogren's syndrome, and iritis.  She is on enbrel 50 mg sq injections every 7 days.  She has noticed improvement in her joint pain and stiffness since increasing the frequency from every 10 days to every 7 days.   She denies any recent rheumatoid arthritis or iritis flares.  She states that she fell 2 weeks ago on the concrete and has some residual discomfort in her right wrist and right third PIP joint.  She is also having tenderness, stiffness, and locking in her left thumb which started about 1 month ago.  She reports that she noticed significant improvement in her knee joint pain after undergoing Visco gel injections in September/October 2021.  She states that she has noticed some increased stiffness and crepitus and would like to reapply for Visco gel injections for both knees.  She denies any new joint swelling at this time.  She reports that her lower back pain has resolved since undergoing a lumbar radiofrequency ablation on 10/09/2020 by Dr. Alvester Morin.  She continues to have pain and stiffness in her neck and would like to go to physical therapy to try to improve her symptoms.  She continues to have chronic sicca symptoms.  She also has activity to stimulate and has to wear sunglasses anytime she is outside.  She has an upcoming appointment with her ophthalmologist next week.  She continues to see her dentist every 6 months and has not had any cavities recently.  She has not taking any over-the-counter products for mouth dryness. According to the patient she had a recent DEXA ordered by Dr. Evlyn Kanner.  In the past she received 2 Prolia injections  so she would like to proceed with reapplying for Prolia through her insurance.  She has been taking calcium and vitamin D on a daily basis.  She denies any recent fractures. She denies any recent infections.      Activities of Daily Living:  Patient reports morning stiffness for 1 hour. Patient Reports nocturnal pain.  Difficulty dressing/grooming: Denies Difficulty climbing stairs: Reports Difficulty getting out of chair: Reports Difficulty using hands for taps, buttons, cutlery, and/or writing: Reports  Review of Systems  Constitutional: Positive for fatigue.  HENT: Positive for mouth dryness and nose dryness. Negative for mouth sores.   Eyes: Positive for pain, itching and dryness.  Respiratory: Negative for shortness of breath and difficulty breathing.   Cardiovascular: Negative for chest pain and palpitations.  Gastrointestinal: Negative for blood in stool, constipation and diarrhea.  Endocrine: Negative for increased urination.  Genitourinary: Negative for difficulty urinating.  Musculoskeletal: Positive for arthralgias, joint pain, joint swelling, myalgias, morning stiffness, muscle tenderness and myalgias.  Skin: Negative for color change, rash and redness.  Allergic/Immunologic: Positive for susceptible to infections.  Neurological: Positive for numbness. Negative for dizziness, headaches, memory loss and weakness.  Hematological: Negative for bruising/bleeding tendency.  Psychiatric/Behavioral: Negative for confusion. The patient is nervous/anxious.     PMFS History:  Patient Active Problem List   Diagnosis Date Noted  .  Scoliosis 07/24/2020  . Spondylosis without myelopathy or radiculopathy, lumbar region 07/24/2020  . Chronic bilateral low back pain without sciatica 07/24/2020  . Posterior vitreous detachment of right eye 03/18/2020  . 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 Stable treated proliferative diabetic retinopathy of right eye determined by examination associated with type 2 diabetes mellitus (HCC) 03/18/2020  . History of vitrectomy 03/18/2020  . Complicated UTI (urinary tract infection) 12/13/2018  . Failure of outpatient treatment 12/11/2018  . Acute metabolic encephalopathy 12/11/2018  . Fever 12/10/2018  . Breast cancer screening, high risk patient 05/09/2018  . Eosinophilia 01/31/2017  . Iritis 01/04/2017  . History of joint swelling 08/24/2016  . Osteoarthritis of knees, bilateral 08/24/2016  . History of migraine 08/24/2016  . History of depression 08/24/2016  . History of osteoporosis 08/24/2016  . DDD (degenerative disc disease), lumbar 08/24/2016  . High risk medication use 08/24/2016  . Trigger finger, left middle finger 08/24/2016  . Trigger finger, right ring finger 08/24/2016  . 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 Increased risk of breast cancer 07/06/2015  . IDDM (insulin dependent diabetes mellitus) 07/06/2015  . 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 Sjogren's syndrome (HCC) 07/06/2015  . Genetic testing 05/12/2015  . Family history of breast cancer 04/15/2015  . Fibrocystic breast disease 10/11/2012  . GERD 10/01/2009  . Chest pain with moderate risk for cardiac etiology 09/02/2009  . DYSPHAGIA 09/02/2009  . ANXIETY 08/15/2009  . ULCER-GASTRIC 08/15/2009  . CONSTIPATION 08/15/2009  . SLOW TRANSIT CONSTIPATION 08/15/2009  . FLATULENCE ERUCTATION AND GAS PAIN 08/15/2009    Past Medical History:  Diagnosis Date  . Abnormal ECG   . Anemia   . Anxiety   . Cataract    . Cateracts bil eyes removed  . GERD (gastroesophageal reflux disease)   . Heart murmur   . History of COVID-19 06/14/2020  . Hypercholesterolemia   . IDDM (insulin dependent diabetes mellitus) 1969  . Migraine headache   .  Neuromuscular disorder (HCC)    bil legs & feet - neuropathy  . Neuropathy   . Osteoporosis    10/2012  . Otosclerosis   . RA (rheumatoid arthritis) (HCC) 2000  . S/P cardiac cath August 2013   Normal coronaries and normal LV function.  . Sjoegren syndrome     Family History  Problem Relation Age of Onset  . Breast cancer Mother 30  . Colon polyps Mother        unspecified number  . Uterine cancer Mother        metastatic  . Breast cancer Maternal Grandmother 22  . Stroke Father 34  . Rheum arthritis Sister   . Breast cancer Sister 48       lump; (-)GT (breast/ova ca panel, GeneDx)  . Skin cancer Maternal Grandfather        unspecified type  . Prostate cancer Maternal Grandfather        dx. late 6s  . Breast cancer Other        dx. in their late 71s  . Breast cancer Other        dx. 78-79  . Breast cancer Cousin        bilateral; dx. late 1s  . Colon cancer Neg Hx   . Esophageal cancer Neg Hx   . Rectal cancer Neg Hx   .  Stomach cancer Neg Hx    Past Surgical History:  Procedure Laterality Date  . back ablation  03/2020  . back ablation  10/09/2020  . BREAST LUMPECTOMY  1995  . CARPAL TUNNEL RELEASE  2000  . CATARACT EXTRACTION Right 09-05-12  . CATARACT EXTRACTION Left 3/15  . CESAREAN SECTION  1987  . COLONOSCOPY    . EYE SURGERY     diabetic retinopathy   . HYSTEROSCOPY  02/2001   and D & C  . LYMPH NODE DISSECTION  2002  . TOE SURGERY  2000   bone removed   Social History   Social History Narrative  . Not on file   Immunization History  Administered Date(s) Administered  . PFIZER(Purple Top)SARS-COV-2 Vaccination 07/11/2019, 08/01/2019, 02/05/2020, 09/15/2020  . Tdap 06/21/2013     Objective: Vital Signs: BP 124/68 (BP Location: Left Arm, Patient Position: Sitting, Cuff Size: Normal)   Pulse 84   Resp 13   Ht  (1.626 m)   Wt 124 lb 3.2 oz (56.3 kg)   LMP 09/19/2008   BMI 21.32 kg/m    Physical Exam Vitals and nursing note reviewed.   Constitutional:      Appearance: She is well-developed.  HENT:     Head: Normocephalic and atraumatic.  Eyes:     Conjunctiva/sclera: Conjunctivae normal.  Pulmonary:     Effort: Pulmonary effort is normal.  Abdominal:     Palpations: Abdomen is soft.  Musculoskeletal:     Cervical back: Normal range of motion.  Skin:    General: Skin is warm and dry.     Capillary Refill: Capillary refill takes less than 2 seconds.  Neurological:     Mental Status: She is alert and oriented to person, place, and time.  Psychiatric:        Behavior: Behavior normal.      Musculoskeletal Exam: C-spine has limited range of motion with lateral rotation.  Thoracic and lumbar spine have good range of motion with no discomfort.  Shoulder joints, elbow joints, wrist joints, MCPs, PIPs, DIPs have good range of motion with no synovitis.  Synovial thickening of bilateral first MCP joints.  Left trigger thumb noted.  Tenderness over the right third PIP joint.  Hip joints have good range of motion with no discomfort.  Knee joints have good range of motion with no warmth or effusion.  Ankle joints have good range of motion with no tenderness or inflammation.  No tenderness over MTP joints.  CDAI Exam: CDAI Score: 0.2  Patient Global: 1 mm; Provider Global: 1 mm Swollen: 0 ; Tender: 0  Joint Exam 10/16/2020   No joint exam has been documented for this visit   There is currently no information documented on the homunculus. Go to the Rheumatology activity and complete the homunculus joint exam.  Investigation: No additional findings.  Imaging: XR C-ARM NO REPORT  Result Date: 10/09/2020 Please see Notes tab for imaging impression.   Recent Labs: Lab Results  Component Value Date   WBC 6.1 07/17/2020   HGB 13.0 07/17/2020   PLT 228 07/17/2020   NA 139 07/17/2020   K 4.0 07/17/2020   CL 104 07/17/2020   CO2 29 07/17/2020   GLUCOSE 224 (H) 07/17/2020   BUN 18 07/17/2020   CREATININE 1.10 (H)  07/17/2020   BILITOT 0.5 07/17/2020   ALKPHOS 79 05/13/2020   AST 24 07/17/2020   ALT 18 07/17/2020   PROT 6.7 07/17/2020   ALBUMIN 3.9 05/13/2020  CALCIUM 9.9 07/17/2020   GFRAA 62 07/17/2020   QFTBGOLD NEGATIVE 04/06/2017   QFTBGOLDPLUS NEGATIVE 07/17/2020    Speciality Comments: Recieves Enbrel Sureclick through [employer].  Procedures:  No procedures performed Allergies: Chlorhexidine, Macrobid [nitrofurantoin macrocrystal], Ciprofloxacin, and Penicillins   Assessment / Plan:     Visit Diagnoses: 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 without organ or systems involvement (HCC): She has no synovitis on examination today.  She has not had any recent rheumatoid arthritis flares.  She is clinically doing well on Enbrel 50 mg subcutaneous injections every 7 days.  She has noticed an improvement in her joint pain and stiffness since increasing the frequency from every 10 days to every 7 days.  She has not had any recent infections.  She is currently experiencing some tenderness in the right wrist and right third PIP joint which she attributes to a recent fall.  No inflammation was noted on examination today.  She will remain on the current treatment regimen.  She was advised to notify us if she develops increased joint pain or joint swelling.  She will follow-up in the office in 5 months.  High risk medication use - Enbrel SureClick 50 mg every 7 days, discontinued methotrexate & folic acid in May 2021.  CBC and CMP were drawn on 07/17/2020.  She is due to update lab work.  Orders for CBC and CMP were released.  Her next lab work will be due in July and every 3 months to monitor for drug toxicity.  Standing orders for CBC and CMP are in place.  TB gold negative on 07/17/2020 and will continue to be monitored yearly.- Plan: CBC with Differential/Platelet, COMPLETE METABOLIC PANEL WITH GFR  Iritis: She has not had any recent iritis flares.  No conjunctival  injection was noted.  Sjogren's syndrome with keratoconjunctivitis sicca (HCC): She continues to have chronic sicca symptoms.  She experiences sun sensitivity in both eyes.  She has not had any recent iritis flares.  She has an upcoming appointment with her ophthalmologist.  We discussed over-the-counter products for symptomatic relief.  She plans on trying Biotene products.  She has been seeing her dentist every 6 months as recommended and has not had any recent dental caries.  Trigger thumb, left thumb: She continues to experience intermittent locking and tenderness.  She has tenderness and synovial thickening of the left first MCP joint.  No synovitis was noted.  She declined a cortisone injection at this time.  She can try using Voltaren gel topically as needed for pain relief.  She was advised to notify us if her symptoms persist or worsen.  Primary osteoarthritis of both knees: She has good range of motion of both knee joints on exam.  No warmth or effusion was noted.  She underwent Visco gel injections in September/October 2021 which provided significant pain relief.  She has started to notice some increased stiffness when rising from a seated position as well as crepitus so she would like to reapply for Visco gel injections at this time.  DDD (degenerative disc disease), cervical -She has limited range of motion with lateral rotation bilaterally.  She continues to experience pain and stiffness in her neck.  She requested a referral to physical therapy.  Plan: Ambulatory referral to Physical Therapy  DDD (degenerative disc disease), lumbar - Followed by Dr. Otelia Sergeant and Dr. Alvester Morin.  She underwent a radiofrequency ablation on 10/09/2020 by Dr. Alvester Morin which has resolved her lower back pain.  History of osteoporosis - DEXA ordered by Dr. Evlyn Kanner.  We will call to obtain her most recent DEXA results.  Previously on prolia x2 doses.  She would like [place] to reapply for Prolia through her insurance pending DEXA  results.  She has been taking calcium and vitamin D on a daily basis as recommended.  She has not had any recent fractures.  Other fatigue: Stable.  She experienced significant fatigue 2 to 3 months after being diagnosed with the COVID-19 infection but her energy level has started to improve.  Other medical conditions are listed as follows:   History of migraine  History of diabetes mellitus  History of gastroesophageal reflux (GERD)  History of depression  COVID-19 virus infection: January 2022.   Orders: Orders Placed This Encounter  Procedures  . CBC with Differential/Platelet  . COMPLETE METABOLIC PANEL WITH GFR  . Ambulatory referral to Physical Therapy   No orders of the defined types were placed in this encounter.    Follow-Up Instructions: Return in about 5 months (around 03/18/2021) for Rheumatoid arthritis, Sjogren's syndrome.   Becky Bienenstock, PA-C  Note - This record has been created using Dragon software.  Chart creation errors have been sought, but may not always  have been located. Such creation errors do not reflect on  the standard of medical care.

## 2020-10-09 ENCOUNTER — Encounter: Payer: Self-pay | Admitting: Physical Medicine and Rehabilitation

## 2020-10-09 ENCOUNTER — Ambulatory Visit (INDEPENDENT_AMBULATORY_CARE_PROVIDER_SITE_OTHER): Payer: Medicare Other | Admitting: Physical Medicine and Rehabilitation

## 2020-10-09 ENCOUNTER — Other Ambulatory Visit: Payer: Self-pay

## 2020-10-09 ENCOUNTER — Ambulatory Visit: Payer: Self-pay

## 2020-10-09 VITALS — BP 125/56 | HR 92

## 2020-10-09 DIAGNOSIS — M47816 Spondylosis without myelopathy or radiculopathy, lumbar region: Secondary | ICD-10-CM

## 2020-10-09 HISTORY — PX: OTHER SURGICAL HISTORY: SHX169

## 2020-10-09 NOTE — Patient Instructions (Signed)

## 2020-10-09 NOTE — Progress Notes (Signed)
Pt state lower back pain. Pt state everything makes it hurts. Pt state heating helps with the hurt.  Numeric Pain Rating Scale and Functional Assessment Average Pain 8   In the last MONTH (on 0-10 scale) has pain interfered with the following?  1. General activity like being  able to carry out your everyday physical activities such as walking, climbing stairs, carrying groceries, or moving a chair?  Rating(8)   +Driver, -BT, -Dye Allergies.

## 2020-10-15 ENCOUNTER — Ambulatory Visit: Payer: Medicare Other | Admitting: Obstetrics and Gynecology

## 2020-10-16 ENCOUNTER — Ambulatory Visit: Payer: Medicare Other | Admitting: Physician Assistant

## 2020-10-16 ENCOUNTER — Encounter: Payer: Self-pay | Admitting: Physician Assistant

## 2020-10-16 ENCOUNTER — Telehealth: Payer: Self-pay

## 2020-10-16 ENCOUNTER — Other Ambulatory Visit: Payer: Self-pay

## 2020-10-16 VITALS — BP 124/68 | HR 84 | Resp 13 | Ht 64.0 in | Wt 124.2 lb

## 2020-10-16 DIAGNOSIS — Z8669 Personal history of other diseases of the nervous system and sense organs: Secondary | ICD-10-CM

## 2020-10-16 DIAGNOSIS — Z8659 Personal history of other mental and behavioral disorders: Secondary | ICD-10-CM

## 2020-10-16 DIAGNOSIS — H209 Unspecified iridocyclitis: Secondary | ICD-10-CM | POA: Diagnosis not present

## 2020-10-16 DIAGNOSIS — M65312 Trigger thumb, left thumb: Secondary | ICD-10-CM

## 2020-10-16 DIAGNOSIS — R5383 Other fatigue: Secondary | ICD-10-CM

## 2020-10-16 DIAGNOSIS — M0579 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 without organ or systems involvement: Secondary | ICD-10-CM

## 2020-10-16 DIAGNOSIS — Z79899 Other long term (current) drug therapy: Secondary | ICD-10-CM | POA: Diagnosis not present

## 2020-10-16 DIAGNOSIS — Z8719 Personal history of other diseases of the digestive system: Secondary | ICD-10-CM

## 2020-10-16 DIAGNOSIS — M5030 Other cervical disc degeneration, unspecified cervical region: Secondary | ICD-10-CM

## 2020-10-16 DIAGNOSIS — M5136 Other intervertebral disc degeneration, lumbar region: Secondary | ICD-10-CM

## 2020-10-16 DIAGNOSIS — U071 COVID-19: Secondary | ICD-10-CM

## 2020-10-16 DIAGNOSIS — M3501 Sicca syndrome with keratoconjunctivitis: Secondary | ICD-10-CM

## 2020-10-16 DIAGNOSIS — Z8739 Personal history of other diseases of the musculoskeletal system and connective tissue: Secondary | ICD-10-CM

## 2020-10-16 DIAGNOSIS — M170 Bilateral primary osteoarthritis of knee: Secondary | ICD-10-CM

## 2020-10-16 DIAGNOSIS — Z8639 Personal history of other endocrine, nutritional and metabolic disease: Secondary | ICD-10-CM

## 2020-10-16 NOTE — Patient Instructions (Signed)
Standing Labs We placed an order today for your standing lab work.   Please have your standing labs drawn at end of July and every 3 months   If possible, please have your labs drawn 2 weeks prior to your appointment so that the provider can discuss your results at your appointment.  We have open lab daily Monday through Thursday from 1:30-4:30 PM and Friday from 1:30-4:00 PM at the office of Dr. Pollyann Savoy, [facility].   Please be advised, all patients with office appointments requiring lab work will take precedents over walk-in lab work.  If possible, please come for your lab work on Monday and Friday afternoons, as you may experience shorter wait times. The office is located at [address], [address], [place], [place] [address] No appointment is necessary.   Labs are drawn by [facility]. Please bring your co-pay at the time of your lab draw.  You may receive a bill from [facility] for your lab work.  If you wish to have your labs drawn at another location, please call the office 24 hours in advance to send orders.  If you have any questions regarding directions or hours of operation,  please call [phone].   As a reminder, please drink plenty of water prior to coming for your lab work. Thanks!

## 2020-10-16 NOTE — Telephone Encounter (Signed)
Please apply for bilateral knee visco, per Taylor Dale, PA-C. Thanks!  

## 2020-10-17 LAB — COMPLETE METABOLIC PANEL WITH GFR
AG Ratio: 1.4 (calc) (ref 1.0–2.5)
ALT: 23 U/L (ref 6–29)
AST: 32 U/L (ref 10–35)
Albumin: 4.2 g/dL (ref 3.6–5.1)
Alkaline phosphatase (APISO): 77 U/L (ref 37–153)
BUN/Creatinine Ratio: 15 (calc) (ref 6–22)
BUN: 15 mg/dL (ref 7–25)
CO2: 28 mmol/L (ref 20–32)
Calcium: 9.8 mg/dL (ref 8.6–10.4)
Chloride: 104 mmol/L (ref 98–110)
Creat: 1.03 mg/dL — ABNORMAL HIGH (ref 0.50–0.99)
GFR, Est African American: 67 mL/min/{1.73_m2} (ref >=60)
GFR, Est Non African American: 57 mL/min/{1.73_m2} — ABNORMAL LOW (ref >=60)
Globulin: 3 g/dL (calc) (ref 1.9–3.7)
Glucose, Bld: 153 mg/dL — ABNORMAL HIGH (ref 65–99)
Potassium: 4.4 mmol/L (ref 3.5–5.3)
Sodium: 139 mmol/L (ref 135–146)
Total Bilirubin: 0.4 mg/dL (ref 0.2–1.2)
Total Protein: 7.2 g/dL (ref 6.1–8.1)

## 2020-10-17 LAB — CBC WITH DIFFERENTIAL/PLATELET
Absolute Monocytes: 407 cells/uL (ref 200–950)
Basophils Absolute: 89 cells/uL (ref 0–200)
Basophils Relative: 1.5 %
Eosinophils Absolute: 319 cells/uL (ref 15–500)
Eosinophils Relative: 5.4 %
HCT: 43 % (ref 35.0–45.0)
Hemoglobin: 14.3 g/dL (ref 11.7–15.5)
Lymphs Abs: 1800 cells/uL (ref 850–3900)
MCH: 29.8 pg (ref 27.0–33.0)
MCHC: 33.3 g/dL (ref 32.0–36.0)
MCV: 89.6 fL (ref 80.0–100.0)
MPV: 13.4 fL — ABNORMAL HIGH (ref 7.5–12.5)
Monocytes Relative: 6.9 %
Neutro Abs: 3286 cells/uL (ref 1500–7800)
Neutrophils Relative %: 55.7 %
Platelets: 284 10*3/uL (ref 140–400)
RBC: 4.8 10*6/uL (ref 3.80–5.10)
RDW: 13.2 % (ref 11.0–15.0)
Total Lymphocyte: 30.5 %
WBC: 5.9 10*3/uL (ref 3.8–10.8)

## 2020-10-17 NOTE — Progress Notes (Signed)
CBC is normal, CMP shows elevated creatinine but stable.  Glucose is elevated at 153.  Patient is diabetic.

## 2020-10-20 NOTE — Telephone Encounter (Signed)
Submitted for VOB on 10/20/2020.

## 2020-10-21 ENCOUNTER — Telehealth: Payer: Self-pay

## 2020-10-21 NOTE — Telephone Encounter (Signed)
I called Dr. Rinaldo Cloud office and left message on machine for [employer] in medical records requesting a copy of most recent DEXA scan. I requested they fax the DEXA to our office.

## 2020-10-22 ENCOUNTER — Telehealth: Payer: Medicare Other | Admitting: *Deleted

## 2020-10-22 ENCOUNTER — Telehealth: Payer: Self-pay | Admitting: *Deleted

## 2020-10-22 DIAGNOSIS — M542 Cervicalgia: Secondary | ICD-10-CM | POA: Diagnosis not present

## 2020-10-22 DIAGNOSIS — M791 Myalgia, unspecified site: Secondary | ICD-10-CM | POA: Diagnosis not present

## 2020-10-22 DIAGNOSIS — G43719 Chronic migraine without aura, intractable, without status migrainosus: Secondary | ICD-10-CM | POA: Diagnosis not present

## 2020-10-22 DIAGNOSIS — Z961 Presence of intraocular lens: Secondary | ICD-10-CM | POA: Diagnosis not present

## 2020-10-22 DIAGNOSIS — H04123 Dry eye syndrome of bilateral lacrimal glands: Secondary | ICD-10-CM | POA: Diagnosis not present

## 2020-10-22 DIAGNOSIS — G518 Other disorders of facial nerve: Secondary | ICD-10-CM | POA: Diagnosis not present

## 2020-10-22 NOTE — Telephone Encounter (Signed)
Please review labs, DEXA and reapply for Prolia. Thank you.

## 2020-10-22 NOTE — Telephone Encounter (Signed)
Received DEXA results from [facility]  Osteopenia  Date of Scan: 10/16/2019 Lowest T-score and lowest site measured:T-score -3.1 left hip neck, BMD 0.715 Significant changes in BMD and site measured (5% and above): -15.4% Lumbar  Current Regimen: Calcium, Vitamin D, history of Prolia, reapplying for Prolia  Recommendation: Continue Calcium and Vitamin D, reapplying for Prolia  Reviewed by: Sherron Ales, PA-C  Next Appointment: 03/18/2021

## 2020-10-28 ENCOUNTER — Other Ambulatory Visit (HOSPITAL_COMMUNITY): Payer: Self-pay

## 2020-10-28 NOTE — Telephone Encounter (Signed)
Plan?follows Medicare guidelines and?covers 80% of the infusion and no authorization is required?for [id number]. Patient has an HMO plan all providers must be in network. [facility] is NOT in Network. Patient does not have Medicare Supplement Plan, so patient will be required to pay the 20% coinsurance per each infusion which will be applied to her Max Out of Pocket- $3,900. Patient has met $317.25 as of 10/28/2020.  A one time exception request has been submitted to Utilization Management in order to gain coverage.  Fax# [phone] 

## 2020-10-28 NOTE — Telephone Encounter (Signed)
Patient has previously received via medical benefit in the past. Will need to reverify benefits for [phone].

## 2020-10-29 NOTE — Telephone Encounter (Signed)
Per [employer], Synvisc was denied under Medicare part D prescription drug coverage. The Social Security Act permits the exclusion of certain drugs or classes of drugs from coverage under part D. Synvisc is a surgical / medical supply, which is one of the excluded classes of drugs from coverage. Synvisc is a preferred product, and not denied for this reason. Please advise of next steps.

## 2020-10-29 NOTE — Telephone Encounter (Signed)
Called [employer], rep advised that [facility] IS considered In-Network- NPI- [id number].  All other details provided matched the previous information.

## 2020-10-30 NOTE — Telephone Encounter (Signed)
I contacted Morrison Old with our insurance department to get cost of administration of injection. Per Morrison Old, injection code [id number] is not filed under Plan D, it is filed under Plan B.  I spoke with Alba Cory at [facility]. Synvisc is a preferred product, and does not need PA. We can [employer], and file under Plan B.  Authorized for Synvisc series Bilateral knees. Buy and Bill. Deductible does not apply. PA not required per Alba Cory at [facility], Synvisc is a preferred product. Insurance to cover 100% of allowable amount of administration cost with a $25.00 copay, and 80% of allowable cost for product.

## 2020-10-31 NOTE — Telephone Encounter (Signed)
I spoke with patient, and advised her of the benefits for Visco. Patient would like to think about it for awhile before scheduling.

## 2020-11-03 ENCOUNTER — Other Ambulatory Visit (HOSPITAL_COMMUNITY): Payer: Self-pay

## 2020-11-03 ENCOUNTER — Telehealth: Payer: Self-pay

## 2020-11-03 NOTE — Telephone Encounter (Signed)
Received notification from [facility] regarding a prior authorization for ENBREL. Authorization has been APPROVED from 11/03/2020 to 11/03/2021.   Authorization # [id number]

## 2020-11-03 NOTE — Telephone Encounter (Signed)
Submitted a Prior Authorization request to [facility] for ENBREL via CoverMyMeds. Will update once we receive a response.   Key: [id number]

## 2020-11-03 NOTE — Telephone Encounter (Signed)
Discussed Prolia BIV findings with patients but she stated that she wanted to know exactly how much she'd pay. I discussed that coinsurance cost is usually not disclocsed to providers and oftentimes patients. She will call her plan and reach back out to the clinic with her decision  Chesley Mires, PharmD, MPH Clinical Pharmacist (Rheumatology and Pulmonology)

## 2020-11-04 DIAGNOSIS — H9113 Presbycusis, bilateral: Secondary | ICD-10-CM | POA: Diagnosis not present

## 2020-11-04 DIAGNOSIS — H903 Sensorineural hearing loss, bilateral: Secondary | ICD-10-CM | POA: Diagnosis not present

## 2020-11-04 NOTE — Progress Notes (Signed)
[name] - 65 y.o. female MRN [id number]  Date of birth: 09-13-1955  Office Visit Note: Visit Date: 10/09/2020 PCP: Adrian Prince, MD Referred by: Adrian Prince, MD  Subjective: Chief Complaint  Patient presents with  . Lower Back - Pain   HPI:  Becky [name] is a 65 y.o. female who comes in today for planned radiofrequency ablation of the Bilateral L5-S1 Lumbar facet joints. This would be ablation of the corresponding medial branches and/or dorsal rami.  Patient has had double diagnostic blocks with more than 50% relief.  These are documented on pain diary.  They have had chronic back pain for quite some time, more than 3 months, which has been an ongoing situation with recalcitrant axial back pain.  They have no radicular pain.  Their axial pain is worse with standing and ambulating and on exam today with facet loading.  They have had physical therapy as well as home exercise program.  The imaging noted in the chart below indicated facet pathology. Accordingly they meet all the criteria and qualification for for radiofrequency ablation and we are going to complete this today hopefully for more longer term relief as part of comprehensive management program.   ROS Otherwise per HPI.  Assessment & Plan: Visit Diagnoses:    ICD-10-CM   1. Spondylosis without myelopathy or radiculopathy, lumbar region  M47.816 XR C-ARM NO REPORT    Radiofrequency,Lumbar    Plan: No additional findings.   Meds & Orders: No orders of the defined types were placed in this encounter.   Orders Placed This Encounter  Procedures  . Radiofrequency,Lumbar  . XR C-ARM NO REPORT    Follow-up: Return if symptoms worsen or fail to improve.   Procedures: No procedures performed  Lumbar Facet Joint Nerve Denervation  [name]      Date of Birth: [birth year] MRN: [id number] PCP: Adrian Prince, MD      Visit Date: 10/09/2020   Universal Protocol:    Date/Time: 05/17/225:37  AM  Consent Given By: the patient  Position: PRONE  Additional Comments: Vital signs were monitored before and after the procedure. Patient was prepped and draped in the usual sterile fashion. The correct patient, procedure, and site was verified.   Injection Procedure Details:   Procedure diagnoses:  1. Spondylosis without myelopathy or radiculopathy, lumbar region      Meds Administered: No orders of the defined types were placed in this encounter.    Laterality: Bilateral  Location/Site:  L5-S1, L4 medial branch and L5 dorsal ramus  Needle: 18 ga.,  42mm active tip RF Cannula  Needle Placement: Along juncture of superior articular process and transverse pocess  Findings:  -Comments:  Procedure Details: For each desired target nerve, the corresponding transverse process (sacral ala for the L5 dorsal rami) was identified and the fluoroscope was positioned to square off the endplates of the corresponding vertebral body to achieve a true AP midline view.  The beam was then obliqued 15 to 20 degrees and caudally tilted 15 to 20 degrees to line up a trajectory along the target nerves. The skin over the target of the junction of superior articulating process and transverse process (sacral ala for the L5 dorsal rami) was infiltrated with 43ml of 1% Lidocaine without Epinephrine.  The 18 gauge 52mm active tip outer cannula was advanced in trajectory view to the target.  This procedure was repeated for each target nerve.  Then, for all levels, the outer cannula placement was fine-tuned  and the position was then confirmed with bi-planar imaging.    Test stimulation was done both at sensory and motor levels to ensure there was no radicular stimulation. The target tissues were then infiltrated with 1 ml of 1% Lidocaine without Epinephrine. Subsequently, a percutaneous neurotomy was carried out for 90 seconds at 80 degrees Celsius.  After the completion of the lesion, 1 ml of injectate was  delivered. It was then repeated for each facet joint nerve mentioned above. Appropriate radiographs were obtained to verify the probe placement during the neurotomy.   Additional Comments:  The patient tolerated the procedure well Dressing: 2 x 2 sterile gauze and Band-Aid    Post-procedure details: Patient was observed during the procedure. Post-procedure instructions were reviewed.  Patient left the clinic in stable condition.        Clinical History: Chronic low back pain and radiculopathy, worsening  EXAM: MRI LUMBAR SPINE WITHOUT CONTRAST  TECHNIQUE: Multiplanar, multisequence MR imaging of the lumbar spine was performed. No intravenous contrast was administered.  COMPARISON:  None.  FINDINGS: Segmentation:  Standard.  Alignment:  No significant anteroposterior listhesis.  Vertebrae: Vertebral body heights are maintained apart from degenerative endplate irregularity primarily at L1-L2. There are chronic appearing degenerative endplate marrow changes at this level.  Conus medullaris and cauda equina: Conus extends to the L1-L2 level. Conus and cauda equina appear normal.  Paraspinal and other soft tissues: Unremarkable.  Disc levels:  L1-L2: Disc bulge with endplate osteophytic ridging eccentric to the right. No canal stenosis. Mild narrowing of the right lateral recess. Moderate right foraminal stenosis. No left foraminal stenosis.  L2-L3:  Minimal disc bulge.  No canal or foraminal stenosis.  L3-L4: Minimal disc bulge. Mild facet arthropathy with ligamentum flavum infolding. No canal or foraminal stenosis.  L4-L5: Minimal disc bulge. Mild facet arthropathy with ligamentum flavum infolding. No canal or foraminal stenosis.  L5-S1: Disc bulge with endplate osteophytic ridging eccentric to the left. Mild facet arthropathy. No significant canal stenosis. Minor right and mild left foraminal stenosis.  IMPRESSION: Overall mild  degenerative changes as detailed above, greatest at L1-L2.   Electronically Signed   By: Guadlupe Spanish M.D.   On: 01/07/2020 09:50     Objective:  VS:  HT:    WT:   BMI:     BP:(!) 125/56  HR:92bpm  TEMP: ( )  RESP:  Physical Exam Vitals and nursing note reviewed.  Constitutional:      General: She is not in acute distress.    Appearance: Normal appearance. She is not ill-appearing.  HENT:     Head: Normocephalic and atraumatic.     Right Ear: External ear normal.     Left Ear: External ear normal.  Eyes:     Extraocular Movements: Extraocular movements intact.  Cardiovascular:     Rate and Rhythm: Normal rate.     Pulses: Normal pulses.  Pulmonary:     Effort: Pulmonary effort is normal. No respiratory distress.  Abdominal:     General: There is no distension.     Palpations: Abdomen is soft.  Musculoskeletal:        General: Tenderness present.     Cervical back: Neck supple.     Right lower leg: No edema.     Left lower leg: No edema.     Comments: Patient has good distal strength with no pain over the greater trochanters.  No clonus or focal weakness. Patient somewhat slow to rise from a seated position to  full extension.  There is concordant low back pain with facet loading and lumbar spine extension rotation.  There are no definitive trigger points but the patient is somewhat tender across the lower back and PSIS.  There is no pain with hip rotation.   Skin:    Findings: No erythema, lesion or rash.  Neurological:     General: No focal deficit present.     Mental Status: She is alert and oriented to person, place, and time.     Sensory: No sensory deficit.     Motor: No weakness or abnormal muscle tone.     Coordination: Coordination normal.  Psychiatric:        Mood and Affect: Mood normal.        Behavior: Behavior normal.      Imaging: No results found.

## 2020-11-04 NOTE — Procedures (Signed)
Lumbar Facet Joint Nerve Denervation  [name]      Date of Birth: [birth year] MRN: [id number] PCP: Adrian Prince, MD      Visit Date: 10/09/2020   Universal Protocol:    Date/Time: 05/17/225:37 AM  Consent Given By: the patient  Position: PRONE  Additional Comments: Vital signs were monitored before and after the procedure. Patient was prepped and draped in the usual sterile fashion. The correct patient, procedure, and site was verified.   Injection Procedure Details:   Procedure diagnoses:  1. Spondylosis without myelopathy or radiculopathy, lumbar region      Meds Administered: No orders of the defined types were placed in this encounter.    Laterality: Bilateral  Location/Site:  L5-S1, L4 medial branch and L5 dorsal ramus  Needle: 18 ga.,  37mm active tip RF Cannula  Needle Placement: Along juncture of superior articular process and transverse pocess  Findings:  -Comments:  Procedure Details: For each desired target nerve, the corresponding transverse process (sacral ala for the L5 dorsal rami) was identified and the fluoroscope was positioned to square off the endplates of the corresponding vertebral body to achieve a true AP midline view.  The beam was then obliqued 15 to 20 degrees and caudally tilted 15 to 20 degrees to line up a trajectory along the target nerves. The skin over the target of the junction of superior articulating process and transverse process (sacral ala for the L5 dorsal rami) was infiltrated with 35ml of 1% Lidocaine without Epinephrine.  The 18 gauge 53mm active tip outer cannula was advanced in trajectory view to the target.  This procedure was repeated for each target nerve.  Then, for all levels, the outer cannula placement was fine-tuned and the position was then confirmed with bi-planar imaging.    Test stimulation was done both at sensory and motor levels to ensure there was no radicular stimulation. The target tissues  were then infiltrated with 1 ml of 1% Lidocaine without Epinephrine. Subsequently, a percutaneous neurotomy was carried out for 90 seconds at 80 degrees Celsius.  After the completion of the lesion, 1 ml of injectate was delivered. It was then repeated for each facet joint nerve mentioned above. Appropriate radiographs were obtained to verify the probe placement during the neurotomy.   Additional Comments:  The patient tolerated the procedure well Dressing: 2 x 2 sterile gauze and Band-Aid    Post-procedure details: Patient was observed during the procedure. Post-procedure instructions were reviewed.  Patient left the clinic in stable condition.

## 2020-11-06 ENCOUNTER — Telehealth: Payer: Self-pay

## 2020-11-06 NOTE — Telephone Encounter (Signed)
Patient left a voicemail stating she was returning Devki's call.  Patient states "she needs to talk to you about something we discussed."

## 2020-11-06 NOTE — Telephone Encounter (Signed)
ATC patient to discuss Prolia and if she had made a decision about moving forward with it. Left VM requesting return call at her convenience.  Will ATC again next week.

## 2020-11-06 NOTE — Telephone Encounter (Addendum)
Returned call to patient likely regarding Prolia - unable to reach. Left VM. Will ATC again Monday, 5/23 .  Chesley Mires, PharmD, MPH Clinical Pharmacist (Rheumatology and Pulmonology)

## 2020-11-11 NOTE — Telephone Encounter (Signed)
Patient's labs that were drawn on 10/16/20 are now out-dated for her to Prolia. She will be going out of town and is unable to complete labs in the next 2 weeks. She will reach out to me when she is back in town and ready to have labs drawn.  She states she called the insurance and was told that she would have $25 copay if she received Prolia at the clinic through [facility]. However, I called her insurance again today and confirmed that she has a 20% coinsurance. I again discussed with patient that this could be several hundred dollars though we are unable to quote a price and that our office does not bill through [facility] for Prolia. She verbalized understanding and would like to move forward with it. She will reach out to me once she is back in town to have labs completed for Prolia  Chesley Mires, PharmD, MPH Clinical Pharmacist (Rheumatology and Pulmonology)

## 2020-11-19 DIAGNOSIS — L720 Epidermal cyst: Secondary | ICD-10-CM | POA: Diagnosis not present

## 2020-11-19 DIAGNOSIS — L738 Other specified follicular disorders: Secondary | ICD-10-CM | POA: Diagnosis not present

## 2020-12-02 NOTE — Telephone Encounter (Signed)
Called patient to f/u on Prolia. She states she is going out of town again tomorrow, 12/03/20, until 12/08/20 and will have labs completed tomorrow. She has been advised of walk-in lab hours. Standing orders for CBC and CMP remain in place.  Will f/u next week  Chesley Mires, PharmD, MPH Clinical Pharmacist (Rheumatology and Pulmonology)

## 2020-12-09 ENCOUNTER — Telehealth: Payer: Self-pay | Admitting: Rheumatology

## 2020-12-09 NOTE — Telephone Encounter (Signed)
Please call patient. Patient was just notified today she was in contact with someone whom tested positive for COVID today. Patient was with them all weekend. Patient wants to know what to do about labs, and Prolia at this point.

## 2020-12-10 ENCOUNTER — Other Ambulatory Visit: Payer: Self-pay | Admitting: Pharmacist

## 2020-12-10 DIAGNOSIS — Z79899 Other long term (current) drug therapy: Secondary | ICD-10-CM

## 2020-12-10 MED ORDER — ENBREL SURECLICK 50 MG/ML ~~LOC~~ SOAJ: SUBCUTANEOUS | 0 refills | Status: DC

## 2020-12-10 NOTE — Telephone Encounter (Signed)
Returned patient's call. She states that she interacted with individual who tested positive on 12/07/20. She states she tested herself using an at-home test yesterday which was negative. Advised that 2-days post-exposure is too soon for test to be accurate. She has received 4 COVID19 vaccines and previously had COVID infection.   Advised that she should remain masked for 10 days post-exposure which would be through next Wednesday, 12/17/20 - recommended she have labs drawn after Wednesday for Prolia. She expressed understanding.  Chesley Mires, PharmD, MPH Clinical Pharmacist (Rheumatology and Pulmonology)

## 2020-12-10 NOTE — Telephone Encounter (Signed)
Next Visit: 03/18/21  Last Visit: 10/16/20  Last Fill: 07/21/20 x 12 pens - she has 2 pens remaining at home  DX: rheumatoid arthritis  Current Dose per office note Enbrel 50mg  subcut every 7 days  Labs: CBC/CMP on 10/16/20: CBC is normal, CMP shows elevated creatinine but stable.  Glucose is elevated at 153.  Patient is diabetic.  TB Gold: negative on 07/17/20   Next labs due 01/15/21. She will be having labs completed next week for Prolia, so rx for 1 month sent. Can send updated rx for 3 months after these labs result  01/17/21, PharmD, MPH Clinical Pharmacist (Rheumatology and Pulmonology)

## 2020-12-16 ENCOUNTER — Encounter (INDEPENDENT_AMBULATORY_CARE_PROVIDER_SITE_OTHER): Payer: Self-pay | Admitting: Ophthalmology

## 2020-12-16 ENCOUNTER — Ambulatory Visit (INDEPENDENT_AMBULATORY_CARE_PROVIDER_SITE_OTHER): Payer: Medicare Other | Admitting: Ophthalmology

## 2020-12-16 ENCOUNTER — Other Ambulatory Visit: Payer: Self-pay

## 2020-12-16 DIAGNOSIS — E103512 Type 1 diabetes mellitus with proliferative diabetic retinopathy with macular edema, left eye: Secondary | ICD-10-CM

## 2020-12-16 DIAGNOSIS — E103551 Type 1 diabetes mellitus with stable proliferative diabetic retinopathy, right eye: Secondary | ICD-10-CM

## 2020-12-16 DIAGNOSIS — Z9889 Other specified postprocedural states: Secondary | ICD-10-CM | POA: Diagnosis not present

## 2020-12-16 DIAGNOSIS — H43811 Vitreous degeneration, right eye: Secondary | ICD-10-CM | POA: Diagnosis not present

## 2020-12-16 DIAGNOSIS — E103552 Type 1 diabetes mellitus with stable proliferative diabetic retinopathy, left eye: Secondary | ICD-10-CM

## 2020-12-16 DIAGNOSIS — E113551 Type 2 diabetes mellitus with stable proliferative diabetic retinopathy, right eye: Secondary | ICD-10-CM

## 2020-12-16 NOTE — Assessment & Plan Note (Signed)
Posterior vitreous detachment as determined and detected on OCT performed 03/18/2020  No cervical floaters today

## 2020-12-16 NOTE — Progress Notes (Signed)
12/16/2020     CHIEF COMPLAINT Patient presents for Retina Follow Up (9 Mo F/U OU//Pt reports stable VA OU. Pt reports intermittent floater temporally OS. No flashes of light or ocular pain OU./A1c: 7.4, 10/2020/LBS: 91 this AM)   HISTORY OF PRESENT ILLNESS: Becky [name] is a 65 y.o. female who presents to the clinic today for:   HPI     Retina Follow Up           Diagnosis: Diabetic Retinopathy   Laterality: both eyes   Onset: 9 months ago   Severity: mild   Duration: 9 months   Course: stable   Comments: 9 Mo F/U OU  Pt reports stable VA OU. Pt reports intermittent floater temporally OS. No flashes of light or ocular pain OU. A1c: 7.4, 10/2020 LBS: 91 this AM       Last edited by Gwendel Hanson, COA on 12/16/2020 12:56 PM.      Referring physician: Adrian Prince, MD [id number] [place],  [place] [address]  HISTORICAL INFORMATION:   Selected notes from the MEDICAL RECORD NUMBER       CURRENT MEDICATIONS: No current outpatient medications on file. (Ophthalmic Drugs)   No current facility-administered medications for this visit. (Ophthalmic Drugs)   Current Outpatient Medications (Other)  Medication Sig   aspirin 81 MG tablet Take 81 mg by mouth daily.   calcium carbonate (OS-CAL) 600 MG TABS Take 600 mg by mouth 2 (two) times daily with a meal.    Continuous Blood Gluc Sensor (FREESTYLE LIBRE 2 SENSOR) MISC CHANGE EVERY 14 DAYS   CONTOUR NEXT TEST test strip    cyclobenzaprine (FLEXERIL) 10 MG tablet Take 10 mg by mouth 3 (three) times daily as needed for muscle spasms. Reported on 07/03/2015   DULoxetine (CYMBALTA) 30 MG capsule Take 90 mg by mouth daily.   DULoxetine (CYMBALTA) 60 MG capsule Take 60 mg by mouth daily.  (Patient not taking: Reported on 10/16/2020)   etanercept (ENBREL SURECLICK) 50 MG/ML injection Inject one pen into skin weekly   furosemide (LASIX) 20 MG tablet Take 10 mg by mouth daily.    insulin aspart (NOVOLOG) 100 UNIT/ML  injection Insulin pump   losartan (COZAAR) 25 MG tablet Take 25 mg by mouth daily.  (Patient not taking: Reported on 10/16/2020)   losartan (COZAAR) 50 MG tablet Take 0.5 tablets by mouth daily.   Multiple Vitamin (MULTIVITAMIN) tablet Take 1 tablet by mouth daily.   polyethylene glycol (MIRALAX / GLYCOLAX) 17 g packet Take 17 g by mouth 2 (two) times daily.    pravastatin (PRAVACHOL) 20 MG tablet Take 20 mg by mouth daily.    SUMAtriptan (IMITREX) 100 MG tablet Take 100 mg by mouth every 2 (two) hours as needed.   topiramate (TOPAMAX) 200 MG tablet Take 200 mg by mouth daily.    vitamin B-12 (CYANOCOBALAMIN) 1000 MCG tablet Take 1,000 mcg by mouth daily.   No current facility-administered medications for this visit. (Other)      REVIEW OF SYSTEMS:    ALLERGIES Allergies  Allergen Reactions   Chlorhexidine Rash    Chloroprep   Macrobid [Nitrofurantoin Macrocrystal] Hives   Ciprofloxacin Rash   Penicillins Rash    PAST MEDICAL HISTORY Past Medical History:  Diagnosis Date   Abnormal ECG    Anemia    Anxiety    Cataract    . Cateracts bil eyes removed   GERD (gastroesophageal reflux disease)    Heart murmur  History of COVID-19 06/14/2020   Hypercholesterolemia    IDDM (insulin dependent diabetes mellitus) 1969   Migraine headache    Neuromuscular disorder (HCC)    bil legs & feet - neuropathy   Neuropathy    Osteoporosis    10/2012   Otosclerosis    RA (rheumatoid arthritis) (HCC) 2000   S/P cardiac cath August 2013   Normal coronaries and normal LV function.   Sjoegren syndrome    Past Surgical History:  Procedure Laterality Date   back ablation  03/2020   back ablation  10/09/2020   BREAST LUMPECTOMY  1995   CARPAL TUNNEL RELEASE  2000   CATARACT EXTRACTION Right 09-05-12   CATARACT EXTRACTION Left 3/15   CESAREAN SECTION  1987   COLONOSCOPY     EYE SURGERY     diabetic retinopathy    HYSTEROSCOPY  02/2001   and D & C   LYMPH NODE DISSECTION  2002    TOE SURGERY  2000   bone removed    FAMILY HISTORY Family History  Problem Relation Age of Onset   Breast cancer Mother 47   Colon polyps Mother        unspecified number   Uterine cancer Mother        metastatic   Breast cancer Maternal Grandmother 84   Stroke Father 55   Rheum arthritis Sister    Breast cancer Sister 48       lump; (-)GT (breast/ova ca panel, GeneDx)   Skin cancer Maternal Grandfather        unspecified type   Prostate cancer Maternal Grandfather        dx. late 45s   Breast cancer Other        dx. in their late 60s   Breast cancer Other        dx. 78-79   Breast cancer Cousin        bilateral; dx. late 89s   Colon cancer Neg Hx    Esophageal cancer Neg Hx    Rectal cancer Neg Hx    Stomach cancer Neg Hx     SOCIAL HISTORY Social History   Tobacco Use   Smoking status: Never   Smokeless tobacco: Never  Vaping Use   Vaping Use: Never used  Substance Use Topics   Alcohol use: Yes    Alcohol/week: 4.0 standard drinks    Types: 2 Shots of liquor, 2 Standard drinks or equivalent per week    Comment: maybe 1-2 cocktails per wk   Drug use: No         OPHTHALMIC EXAM:  Base Eye Exam     Visual Acuity (ETDRS)       Right Left   Dist [place] 20/30 +1 20/30   Dist ph  20/25 20/25         Tonometry (Tonopen, 1:02 PM)       Right Left   Pressure 13 12         Pupils       Pupils Dark Light Shape React APD   Right PERRL 3 2 Round Brisk None   Left PERRL 3 2 Round Brisk None         Visual Fields (Counting fingers)       Left Right    Full Full         Extraocular Movement       Right Left    Full Full  Neuro/Psych     Oriented x3: Yes   Mood/Affect: Normal         Dilation     Both eyes: 1.0% Mydriacyl, 2.5% Phenylephrine @ 1:02 PM           Slit Lamp and Fundus Exam     External Exam       Right Left   External Normal Normal         Slit Lamp Exam       Right Left   Lids/Lashes  Normal Normal   Conjunctiva/Sclera White and quiet White and quiet   Cornea Clear Clear   Anterior Chamber Deep and quiet Deep and quiet   Iris Round and reactive Round and reactive   Lens Posterior chamber intraocular lens Posterior chamber intraocular lens   Anterior Vitreous Normal Normal         Fundus Exam       Right Left   Posterior Vitreous Clear media,, Posterior vitreous detachment Clear media,, Posterior vitreous detachment   Disc Normal Normal   C/D Ratio 0.45 0.55   Macula Microaneurysms, no clinically significant macular edema Microaneurysms, no clinically significant macular edema   Vessels PDR-quiet PDR-quiet   Periphery Good PRP 360, a attached, clear media Good PRP 360,  attached, clear media            IMAGING AND PROCEDURES  Imaging and Procedures for 12/16/20  Color Fundus Photography Optos - OU - Both Eyes       Right Eye Progression has been stable. Disc findings include normal observations, increased cup to disc ratio. Macula : normal observations, microaneurysms.   Left Eye Progression has been stable. Disc findings include normal observations, increased cup to disc ratio. Macula : normal observations, microaneurysms.   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 The patient was advised to maintain good glucose, blood pressure, monitor kidney function and serum lipid control as advised by personal physician. Rare risk for reactivation of progression exist with untreated severe anemia, untreated renal failure, untreated heart failure, and smoking. Complete avoidance of smoking was recommended. The chance of recurrent  proliferative diabetic retinopathy was discussed as well as the chance of vitreous hemorrhage for which further treatments may be necessary.   Explained to the patient that the quiescent  proliferative diabetic retinopathy disease is unlikely to ever worsen.  Worsening factors would include however severe anemia, hypertension out-of-control or impending renal failure.  Stable treated proliferative diabetic retinopathy of right eye determined by examination associated with type 2 diabetes mellitus (HCC) The nature of regressed proliferative diabetic retinopathy was discussed with the patient. The patient was advised to maintain good glucose, blood pressure, monitor kidney function and serum lipid control as advised by personal physician. Rare risk for reactivation of progression exist with untreated severe anemia, untreated renal failure, untreated heart failure, and smoking. Complete avoidance of smoking was recommended. The chance of recurrent proliferative diabetic retinopathy was discussed as well as the chance of vitreous hemorrhage for which further treatments may be necessary.   Explained to the patient that the quiescent  proliferative diabetic retinopathy disease is unlikely to ever worsen.  Worsening factors would include however severe anemia, hypertension out-of-control  or impending renal failure.  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 Stable treated proliferative diabetic retinopathy of right eye determined by examination associated with type 2 diabetes mellitus (HCC)  S28.3151 Color Fundus Photography Optos - OU - Both Eyes    2. 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 History of vitrectomy  Z98.890     4. Posterior vitreous detachment of right eye  H43.811       1.  Visual acuity OU stable, PDR OU quiescent from type 1 diabetes  2.  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  Notably at the time of vitrectomy core vitrectomy was completed successfully failure to create active posterior vitreous detachment due to the dense adherence of a hyaloid over the nerve and macula at that time.  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  3.  OS clear media  Ophthalmic Meds Ordered this visit:  No orders of the defined types were placed in this encounter.      Return in about 9 months (around 09/15/2021) for DILATE OU, OCT.  There are no Patient Instructions on file for this visit.   Explained the diagnoses, plan, and follow up with the patient and they expressed understanding.  Patient expressed understanding of the importance of proper follow up care.   Alford Highland Paulene Tayag M.D. Diseases & Surgery of the Retina and Vitreous [facility] 12/16/20     Abbreviations: M myopia (nearsighted); A astigmatism; H hyperopia (farsighted); P presbyopia; Mrx spectacle prescription;  CTL contact lenses; OD right eye; OS left eye; OU both eyes  XT exotropia; ET esotropia; PEK punctate epithelial keratitis; PEE punctate epithelial erosions; DES dry eye syndrome; MGD meibomian gland dysfunction; ATs artificial tears; PFAT's preservative free artificial tears; NSC nuclear sclerotic cataract; PSC posterior subcapsular cataract; ERM epi-retinal membrane; PVD posterior vitreous detachment; RD retinal detachment; DM diabetes mellitus; DR diabetic retinopathy; NPDR non-proliferative diabetic retinopathy; PDR proliferative diabetic retinopathy; CSME clinically significant macular edema; DME diabetic macular edema; dbh dot blot hemorrhages;  CWS cotton wool spot; POAG primary open angle glaucoma; C/D cup-to-disc ratio; HVF humphrey visual field; GVF goldmann visual field; OCT optical coherence tomography; IOP intraocular pressure; BRVO Branch retinal vein occlusion; CRVO central retinal vein occlusion; CRAO central retinal artery occlusion; BRAO branch retinal artery occlusion; RT retinal tear; SB scleral buckle; PPV pars plana vitrectomy; VH Vitreous hemorrhage; PRP panretinal laser photocoagulation; IVK intravitreal kenalog; VMT vitreomacular traction; MH Macular hole;  NVD neovascularization of the disc; NVE neovascularization elsewhere; AREDS age related eye disease study; ARMD age related macular degeneration; POAG primary open angle glaucoma; EBMD epithelial/anterior basement membrane dystrophy; ACIOL anterior chamber intraocular lens; IOL intraocular lens; PCIOL posterior chamber intraocular lens; Phaco/IOL phacoemulsification with intraocular lens placement; PRK photorefractive keratectomy; LASIK laser assisted in situ keratomileusis; HTN hypertension; DM diabetes mellitus; COPD chronic obstructive pulmonary disease

## 2020-12-16 NOTE — Assessment & Plan Note (Signed)

## 2020-12-16 NOTE — Assessment & Plan Note (Signed)
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

## 2020-12-17 ENCOUNTER — Other Ambulatory Visit: Payer: Self-pay

## 2020-12-17 ENCOUNTER — Other Ambulatory Visit: Payer: Self-pay | Admitting: *Deleted

## 2020-12-17 DIAGNOSIS — Z79899 Other long term (current) drug therapy: Secondary | ICD-10-CM

## 2020-12-17 DIAGNOSIS — M0579 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 without organ or systems involvement: Secondary | ICD-10-CM

## 2020-12-17 DIAGNOSIS — G43719 Chronic migraine without aura, intractable, without status migrainosus: Secondary | ICD-10-CM | POA: Diagnosis not present

## 2020-12-17 DIAGNOSIS — G518 Other disorders of facial nerve: Secondary | ICD-10-CM | POA: Diagnosis not present

## 2020-12-17 DIAGNOSIS — M542 Cervicalgia: Secondary | ICD-10-CM | POA: Diagnosis not present

## 2020-12-17 DIAGNOSIS — M791 Myalgia, unspecified site: Secondary | ICD-10-CM | POA: Diagnosis not present

## 2020-12-17 LAB — COMPLETE METABOLIC PANEL WITH GFR
AG Ratio: 1.5 (calc) (ref 1.0–2.5)
ALT: 18 U/L (ref 6–29)
AST: 23 U/L (ref 10–35)
Albumin: 4.2 g/dL (ref 3.6–5.1)
Alkaline phosphatase (APISO): 64 U/L (ref 37–153)
BUN/Creatinine Ratio: 18 (calc) (ref 6–22)
BUN: 19 mg/dL (ref 7–25)
CO2: 26 mmol/L (ref 20–32)
Calcium: 9.4 mg/dL (ref 8.6–10.4)
Chloride: 106 mmol/L (ref 98–110)
Creat: 1.04 mg/dL — ABNORMAL HIGH (ref 0.50–0.99)
GFR, Est African American: 66 mL/min/{1.73_m2} (ref >=60)
GFR, Est Non African American: 57 mL/min/{1.73_m2} — ABNORMAL LOW (ref >=60)
Globulin: 2.8 g/dL (calc) (ref 1.9–3.7)
Glucose, Bld: 131 mg/dL — ABNORMAL HIGH (ref 65–99)
Potassium: 4.4 mmol/L (ref 3.5–5.3)
Sodium: 139 mmol/L (ref 135–146)
Total Bilirubin: 0.4 mg/dL (ref 0.2–1.2)
Total Protein: 7 g/dL (ref 6.1–8.1)

## 2020-12-17 LAB — CBC WITH DIFFERENTIAL/PLATELET
Absolute Monocytes: 402 cells/uL (ref 200–950)
Basophils Absolute: 88 cells/uL (ref 0–200)
Basophils Relative: 1.6 %
Eosinophils Absolute: 413 cells/uL (ref 15–500)
Eosinophils Relative: 7.5 %
HCT: 41.2 % (ref 35.0–45.0)
Hemoglobin: 13.5 g/dL (ref 11.7–15.5)
Lymphs Abs: 1766 cells/uL (ref 850–3900)
MCH: 29.8 pg (ref 27.0–33.0)
MCHC: 32.8 g/dL (ref 32.0–36.0)
MCV: 90.9 fL (ref 80.0–100.0)
MPV: 13.5 fL — ABNORMAL HIGH (ref 7.5–12.5)
Monocytes Relative: 7.3 %
Neutro Abs: 2833 cells/uL (ref 1500–7800)
Neutrophils Relative %: 51.5 %
Platelets: 258 10*3/uL (ref 140–400)
RBC: 4.53 10*6/uL (ref 3.80–5.10)
RDW: 13.6 % (ref 11.0–15.0)
Total Lymphocyte: 32.1 %
WBC: 5.5 10*3/uL (ref 3.8–10.8)

## 2020-12-17 LAB — VITAMIN D 25 HYDROXY (VIT D DEFICIENCY, FRACTURES): Vit D, 25-Hydroxy: 57 ng/mL (ref 30–100)

## 2020-12-18 ENCOUNTER — Encounter: Payer: Self-pay | Admitting: Obstetrics and Gynecology

## 2020-12-18 ENCOUNTER — Other Ambulatory Visit: Payer: Self-pay | Admitting: Pharmacist

## 2020-12-18 ENCOUNTER — Ambulatory Visit: Payer: Medicare Other | Admitting: Obstetrics and Gynecology

## 2020-12-18 ENCOUNTER — Other Ambulatory Visit: Payer: Self-pay

## 2020-12-18 ENCOUNTER — Other Ambulatory Visit (HOSPITAL_COMMUNITY)
Admission: RE | Admit: 2020-12-18 | Discharge: 2020-12-18 | Disposition: A | Discharge status: Home / Self Care | Payer: Medicare Other | Source: Ambulatory Visit | Attending: Obstetrics and Gynecology | Admitting: Obstetrics and Gynecology

## 2020-12-18 VITALS — BP 116/76 | Ht 63.5 in | Wt 121.0 lb

## 2020-12-18 DIAGNOSIS — N952 Postmenopausal atrophic vaginitis: Secondary | ICD-10-CM | POA: Diagnosis not present

## 2020-12-18 DIAGNOSIS — N951 Menopausal and female climacteric states: Secondary | ICD-10-CM

## 2020-12-18 DIAGNOSIS — Z9189 Other specified personal risk factors, not elsewhere classified: Secondary | ICD-10-CM | POA: Diagnosis not present

## 2020-12-18 DIAGNOSIS — Z803 Family history of malignant neoplasm of breast: Secondary | ICD-10-CM

## 2020-12-18 DIAGNOSIS — Z8739 Personal history of other diseases of the musculoskeletal system and connective tissue: Secondary | ICD-10-CM

## 2020-12-18 DIAGNOSIS — Z124 Encounter for screening for malignant neoplasm of cervix: Secondary | ICD-10-CM

## 2020-12-18 DIAGNOSIS — Z1239 Encounter for other screening for malignant neoplasm of breast: Secondary | ICD-10-CM

## 2020-12-18 DIAGNOSIS — Z01411 Encounter for gynecological examination (general) (routine) with abnormal findings: Secondary | ICD-10-CM

## 2020-12-18 DIAGNOSIS — Z008 Encounter for other general examination: Secondary | ICD-10-CM

## 2020-12-18 NOTE — Telephone Encounter (Addendum)
Shifted Prolia authorization approval from 12/23/20 through 01/23/21  Awaiting fax confirmation from [facility]  Chesley Mires, PharmD, MPH Clinical Pharmacist (Rheumatology and Pulmonology)

## 2020-12-18 NOTE — Progress Notes (Signed)
65 y.o. G48P1001 Married Caucasian female here for annual exam.    Sees Dr. Darnelle Catalan yearly for increased risk of breast cancer.   Having night sweats.   Has neuropathy in her feet.  Also has back trouble.  Had Rx for Neurontin but choose not to take it.   Patient's mother died from uterine cancer following treatment with tamoxifen.   PCP:  Adrian Prince, MD   Patient's last menstrual period was 09/19/2008.           Sexually active: Yes.    The current method of family planning is post menopausal status.    Exercising: Yes.    Home exercise routine includes walk. Smoker:  no  Health Maintenance: Pap: 09-21-17 Neg:Neg HR HPV, 03-18-15 Neg:Neg HR HPV History of abnormal Pap:  yes, years ago in 20s -- normal since MMG: 07-29-20 MR Breast Bil/Neg/Birads1.  Mammogram 02/19/20- BI-RADS1.  Colonoscopy: 07/03/15, Normal, repeat in 10 years  BMD: 09/2015  Result :Osteoporosis--Dr.South TDaP: 06-21-13 Gardasil:   no HIV:12-10-18 NR Hep C:neg Screening Labs:  Hb today:no Urine today: no   reports that she has never smoked. She has never used smokeless tobacco. She reports current alcohol use of about 4.0 standard drinks of alcohol per week. She reports that she does not use drugs.  Past Medical History:  Diagnosis Date   Abnormal ECG    Anemia    Anxiety    Cataract    . Cateracts bil eyes removed   GERD (gastroesophageal reflux disease)    Heart murmur    History of COVID-19 06/14/2020   Hypercholesterolemia    IDDM (insulin dependent diabetes mellitus) 1969   Migraine headache    Neuromuscular disorder (HCC)    bil legs & feet - neuropathy   Neuropathy    Osteoporosis    10/2012   Otosclerosis    RA (rheumatoid arthritis) (HCC) 2000   S/P cardiac cath August 2013   Normal coronaries and normal LV function.   Sjoegren syndrome     Past Surgical History:  Procedure Laterality Date   back ablation  03/2020   back ablation  10/09/2020   BREAST LUMPECTOMY  1995   CARPAL TUNNEL  RELEASE  2000   CATARACT EXTRACTION Right 09-05-12   CATARACT EXTRACTION Left 3/15   CESAREAN SECTION  1987   COLONOSCOPY     EYE SURGERY     diabetic retinopathy    HYSTEROSCOPY  02/2001   and D & C   LYMPH NODE DISSECTION  2002   TOE SURGERY  2000   bone removed    Current Outpatient Medications  Medication Sig Dispense Refill   aspirin 81 MG tablet Take 81 mg by mouth daily.     calcium carbonate (OS-CAL) 600 MG TABS Take 600 mg by mouth 2 (two) times daily with a meal.      Continuous Blood Gluc Sensor (FREESTYLE LIBRE 2 SENSOR) MISC CHANGE EVERY 14 DAYS     CONTOUR NEXT TEST test strip   6   cyclobenzaprine (FLEXERIL) 10 MG tablet Take 10 mg by mouth 3 (three) times daily as needed for muscle spasms. Reported on 07/03/2015     DULoxetine (CYMBALTA) 30 MG capsule Take 90 mg by mouth daily.     etanercept (ENBREL SURECLICK) 50 MG/ML injection Inject one pen into skin weekly 4 mL 0   furosemide (LASIX) 20 MG tablet Take 10 mg by mouth daily.      insulin aspart (NOVOLOG) 100 UNIT/ML injection Insulin  pump     losartan (COZAAR) 50 MG tablet Take 0.5 tablets by mouth daily.     Multiple Vitamin (MULTIVITAMIN) tablet Take 1 tablet by mouth daily.     polyethylene glycol (MIRALAX / GLYCOLAX) 17 g packet Take 17 g by mouth 2 (two) times daily.      pravastatin (PRAVACHOL) 20 MG tablet Take 20 mg by mouth daily.      SUMAtriptan (IMITREX) 100 MG tablet Take 100 mg by mouth every 2 (two) hours as needed.     topiramate (TOPAMAX) 200 MG tablet Take 200 mg by mouth daily.      vitamin B-12 (CYANOCOBALAMIN) 1000 MCG tablet Take 1,000 mcg by mouth daily.     No current facility-administered medications for this visit.    Family History  Problem Relation Age of Onset   Breast cancer Mother 73   Colon polyps Mother        unspecified number   Uterine cancer Mother        metastatic   Breast cancer Maternal Grandmother 59   Stroke Father 22   Rheum arthritis Sister    Breast cancer  Sister 48       lump; (-)GT (breast/ova ca panel, GeneDx)   Skin cancer Maternal Grandfather        unspecified type   Prostate cancer Maternal Grandfather        dx. late 17s   Breast cancer Other        dx. in their late 60s   Breast cancer Other        dx. 78-79   Breast cancer Cousin        bilateral; dx. late 38s   Colon cancer Neg Hx    Esophageal cancer Neg Hx    Rectal cancer Neg Hx    Stomach cancer Neg Hx     Review of Systems  All other systems reviewed and are negative.  Exam:   BP 116/76   Ht 5' 3.5" (1.613 m)   Wt 121 lb (54.9 kg)   LMP 09/19/2008   BMI 21.10 kg/m     General appearance: alert, cooperative and appears stated age Head: normocephalic, without obvious abnormality, atraumatic Neck: no adenopathy, supple, symmetrical, trachea midline and thyroid normal to inspection and palpation Lungs: clear to auscultation bilaterally Breasts: bilateral breast scars, no masses or tenderness, No nipple retraction or dimpling, No nipple discharge or bleeding, No axillary adenopathy Heart: regular rate and rhythm Abdomen: soft, non-tender; no masses, no organomegaly Extremities: extremities normal, atraumatic, no cyanosis or edema Skin: skin color, texture, turgor normal. No rashes or lesions Lymph nodes: cervical, supraclavicular, and axillary nodes normal. Neurologic: grossly normal  Pelvic: External genitalia:  no lesions              No abnormal inguinal nodes palpated.              Urethra:  normal appearing urethra with no masses, tenderness or lesions              Bartholins and Skenes: normal                 Vagina: vaginal atrophy noted.               Cervix: no lesions              Pap taken: yes.  Bimanual Exam:  Uterus:  normal size, contour, position, consistency, mobility, non-tender  Adnexa: no mass, fullness, tenderness              Rectal exam: Yes.  .  Confirms.              Anus:  normal sphincter tone, no lesions  Chaperone  was present for exam.  Assessment:    Screening breast exam. FH breast cancer.  Mother with breast cancer and metastatic uterine cancer following Tamoxifen.  Personal hx of fibroglandular breasts. Status post bilateral breast surgeries. Increased lifetime risk of breast cancer.  Intolerant of Tamoxifen due to severe constipation. Category D density of breast tissue.  Negative genetic screening. Pelvic exam with abnormal finding present.  Vaginal atrophy. Cervical cancer screening.  Rectal exam.  Menopausal symptoms.  IDDM. Peripheral neuropathy. Osteoporosis.  On Prolia.  Care through Dr. Evlyn Kanner. Chronic constipation.   Plan: Mammogram screening discussed. Self breast awareness reviewed. Pap and HR HPV as above. Guidelines for Calcium, Vitamin D, regular exercise program including cardiovascular and weight bearing exercise. Use water based lubricants and cooking oils for vaginal dryness. Consider starting Neurontin at hs for hot flashes.  Goal would be to start at 100 mg and work up to 300 mg q hs. We reviewed the multiple benefits of the medication for also treating neuropathy and chronic pain.  She will review this with her PCP as well.  Follow up annually and prn.   28 min total time was spent for this patient encounter, including preparation, face-to-face counseling with the patient, coordination of care, and documentation of the encounter.

## 2020-12-18 NOTE — Progress Notes (Signed)
Glucose is mildly elevated, GFR is low and stable.  CBC is normal.  Vitamin D is 57 which is normal.

## 2020-12-18 NOTE — Progress Notes (Addendum)
Patient will be starting Prolia. Not yet scheduled and due for updated orders. Diagnosis: age-related osteoporosis without current pathological fracture  Auth approved for one dose from 12/23/20 through 01/17/21. Will need new auth with subsequent doses  Dose: 60mg  every 6 months  Last Clinic Visit: 10/16/20 Next Clinic Visit: not yet scheduled  Last infusion: 10/16/20 Labs: CBC wnl on 12/17/20 and CMP showed GFR that is low but stable  DEXA 10/16/19 - Lowest T-score and lowest site measured:T-score -3.1 left hip neck, BMD 0.715 .  Significant changes in BMD and site measured (5% and above): -15.4% Lumbar  Orders placed for Prolia x 1 dose.  Called patient and provided with phone number for: [facility] [phone]) - ([phone], PharmD, MPH Clinical Pharmacist (Rheumatology and Pulmonology)  Addendum: Prolia scheduled for 01/12/21. Will f/u to ensure completed since auth is only approved through 01/17/21

## 2020-12-18 NOTE — Patient Instructions (Signed)

## 2020-12-23 LAB — CYTOLOGY - PAP: Diagnosis: NEGATIVE

## 2020-12-24 NOTE — Telephone Encounter (Signed)
Called rep to confirm dates. 12/23/20-01/23/21 has been approved. The rep could not fax me a confirmation letter as they just update the approval dates in their system without opening a new authorization.

## 2020-12-29 ENCOUNTER — Ambulatory Visit: Payer: Medicare Other | Admitting: Physician Assistant

## 2020-12-29 ENCOUNTER — Other Ambulatory Visit: Payer: Self-pay

## 2020-12-29 DIAGNOSIS — M170 Bilateral primary osteoarthritis of knee: Secondary | ICD-10-CM

## 2020-12-29 MED ORDER — HYLAN G-F 20 16 MG/2ML IX SOSY
16.0000 mg | PREFILLED_SYRINGE | INTRA_ARTICULAR | Status: AC | PRN
  Administered 2020-12-29: 16 mg via INTRA_ARTICULAR

## 2020-12-29 MED ORDER — LIDOCAINE HCL 1 % IJ SOLN
1.5000 mL | INTRAMUSCULAR | Status: AC | PRN
  Administered 2020-12-29: 1.5 mL via INTRA_ARTICULAR

## 2020-12-29 NOTE — Progress Notes (Signed)
   Procedure Note  [name]             Date of Birth: 08/09/1955           MRN: [id number]             Visit Date: 12/29/2020  Procedures: Visit Diagnoses:  1. Primary osteoarthritis of both knees    Synvisc #1 Bilateral knee joint injections  Large Joint Inj: bilateral knee on 12/29/2020 8:59 AM Indications: pain Details: 25 G 1.5 in medial  Arthrogram: No  Medications (Right): 1.5 mL lidocaine 1 %; 16 mg Hylan 16 MG/2ML Aspirate (Right): 0 mL Medications (Left): 1.5 mL lidocaine 1 %; 16 mg Hylan 16 MG/2ML Aspirate (Left): 0 mL Outcome: tolerated well, no immediate complications    Patient tolerated the procedure well.  Aftercare was discussed.  Sherron Ales, PA-C

## 2020-12-31 DIAGNOSIS — E109 Type 1 diabetes mellitus without complications: Secondary | ICD-10-CM | POA: Diagnosis not present

## 2020-12-31 DIAGNOSIS — Z794 Long term (current) use of insulin: Secondary | ICD-10-CM | POA: Diagnosis not present

## 2021-01-05 ENCOUNTER — Other Ambulatory Visit: Payer: Self-pay

## 2021-01-05 ENCOUNTER — Ambulatory Visit: Payer: Medicare Other | Admitting: Physician Assistant

## 2021-01-05 DIAGNOSIS — M170 Bilateral primary osteoarthritis of knee: Secondary | ICD-10-CM

## 2021-01-05 MED ORDER — HYLAN G-F 20 16 MG/2ML IX SOSY
16.0000 mg | PREFILLED_SYRINGE | INTRA_ARTICULAR | Status: AC | PRN
  Administered 2021-01-05: 16 mg via INTRA_ARTICULAR

## 2021-01-05 MED ORDER — LIDOCAINE HCL 1 % IJ SOLN
1.5000 mL | INTRAMUSCULAR | Status: AC | PRN
  Administered 2021-01-05: 1.5 mL via INTRA_ARTICULAR

## 2021-01-05 NOTE — Progress Notes (Signed)
   Procedure Note  [name]             Date of Birth: 09/18/1955           MRN: [id number]             Visit Date: 01/05/2021  Procedures: Visit Diagnoses:  1. Primary osteoarthritis of both knees      Synvisc #2 bilateral knee, B/B Large Joint Inj: bilateral knee on 01/05/2021 11:04 AM Indications: pain Details: 25 G 1.5 in medial  Arthrogram: No  Medications (Right): 1.5 mL lidocaine 1 %; 16 mg Hylan 16 MG/2ML Aspirate (Right): 0 mL Medications (Left): 1.5 mL lidocaine 1 %; 16 mg Hylan 16 MG/2ML Aspirate (Left): 0 mL Outcome: tolerated well, no immediate complications   Patient tolerated the procedure well.  Aftercare was discussed.  Sherron Ales, PA-C

## 2021-01-07 ENCOUNTER — Ambulatory Visit: Payer: Self-pay

## 2021-01-07 ENCOUNTER — Ambulatory Visit (INDEPENDENT_AMBULATORY_CARE_PROVIDER_SITE_OTHER): Payer: Medicare Other | Admitting: Rheumatology

## 2021-01-07 ENCOUNTER — Other Ambulatory Visit: Payer: Self-pay

## 2021-01-07 DIAGNOSIS — M65312 Trigger thumb, left thumb: Secondary | ICD-10-CM | POA: Diagnosis not present

## 2021-01-07 MED ORDER — LIDOCAINE HCL 1 % IJ SOLN
0.5000 mL | INTRAMUSCULAR | Status: AC | PRN
  Administered 2021-01-07: .5 mL

## 2021-01-07 MED ORDER — TRIAMCINOLONE ACETONIDE 40 MG/ML IJ SUSP
10.0000 mg | INTRAMUSCULAR | Status: AC | PRN
  Administered 2021-01-07: 10 mg

## 2021-01-07 NOTE — Progress Notes (Signed)
   Procedure Note  [name]             Date of Birth: 1955/09/27           MRN: [id number]             Visit Date: 01/07/2021 Indication-pain and discomfort in the left thumb. Patient is unable to bend her left thumb.  She had thickening of left thumb flexor tendon sheath on examination.  Different treatment options and their side effects were discussed.  Patient wanted to proceed with trigger point injection.  She had good response to left trigger thumb injection in April 2021. Procedures: Visit Diagnoses:  1. Trigger thumb, left thumb    Ultrasound guided injection is preferred based studies that show increased duration, increased effect, greater accuracy, decreased procedural pain, increased response rate, and decreased cost with ultrasound guided versus blind injection.   Verbal informed consent obtained.  Time-out conducted.  Noted no overlying erythema, induration, or other signs of local infection. Ultrasound-guided (left trigger thumb) After sterile prep with Betadine, injected 0.5 mL lidocaine and 1 mg, using a 27 gauge needle, needle placement was confirmed in the left thumb flexor tendon sheath.   Hand/UE Inj for trigger finger on 01/07/2021 3:49 PM Indications: pain, tendon swelling and therapeutic Details: 27 G needle, ultrasound-guided volar approach Medications: 0.5 mL lidocaine 1 %; 10 mg triamcinolone acetonide 40 MG/ML Aspirate: 0 mL Outcome: tolerated well, no immediate complications Patient was prepped and draped in the usual sterile fashion.   Patient tolerated the procedure well.  A splint was applied.  Postprocedure instructions were given. Pollyann Savoy, MD

## 2021-01-12 ENCOUNTER — Other Ambulatory Visit: Payer: Self-pay

## 2021-01-12 ENCOUNTER — Ambulatory Visit (HOSPITAL_COMMUNITY)
Admission: RE | Admit: 2021-01-12 | Discharge: 2021-01-12 | Disposition: A | Discharge status: Home / Self Care | Payer: Medicare Other | Source: Ambulatory Visit | Attending: Rheumatology | Admitting: Rheumatology

## 2021-01-12 ENCOUNTER — Ambulatory Visit: Payer: Medicare Other | Admitting: Physician Assistant

## 2021-01-12 DIAGNOSIS — M170 Bilateral primary osteoarthritis of knee: Secondary | ICD-10-CM | POA: Diagnosis not present

## 2021-01-12 DIAGNOSIS — Z8739 Personal history of other diseases of the musculoskeletal system and connective tissue: Secondary | ICD-10-CM | POA: Insufficient documentation

## 2021-01-12 MED ORDER — LIDOCAINE HCL 1 % IJ SOLN
1.5000 mL | INTRAMUSCULAR | Status: AC | PRN
  Administered 2021-01-12: 1.5 mL via INTRA_ARTICULAR

## 2021-01-12 MED ORDER — DENOSUMAB 60 MG/ML ~~LOC~~ SOSY
60.0000 mg | PREFILLED_SYRINGE | Freq: Once | SUBCUTANEOUS | Status: AC
  Administered 2021-01-12: 60 mg via SUBCUTANEOUS

## 2021-01-12 MED ORDER — DENOSUMAB 60 MG/ML ~~LOC~~ SOSY
PREFILLED_SYRINGE | SUBCUTANEOUS | Status: AC
  Filled 2021-01-12: qty 1

## 2021-01-12 MED ORDER — HYLAN G-F 20 16 MG/2ML IX SOSY
16.0000 mg | PREFILLED_SYRINGE | INTRA_ARTICULAR | Status: AC | PRN
  Administered 2021-01-12: 16 mg via INTRA_ARTICULAR

## 2021-01-12 NOTE — Progress Notes (Signed)
   Procedure Note  [name]             Date of Birth: [birth year]           MRN: [id number]             Visit Date: 01/12/2021  Procedures: Visit Diagnoses:  1. Primary osteoarthritis of both knees     Synvisc #3 bilateral knees, B/B Large Joint Inj: bilateral knee on 01/12/2021 9:14 AM Indications: pain Details: 25 G 1.5 in medial  Arthrogram: No  Medications (Right): 1.5 mL lidocaine 1 %; 16 mg Hylan 16 MG/2ML Aspirate (Right): 0 mL Medications (Left): 1.5 mL lidocaine 1 %; 16 mg Hylan 16 MG/2ML Aspirate (Left): 0 mL Outcome: tolerated well, no immediate complications   Patient tolerated the procedure well.  Aftercare was discussed.   Patient requested a prescription to receive the shingrix vaccine.  She is planning on holding enbrel 1 week prior to and 2 weeks after the vaccine.  She was also advised to space the vaccine at least 2 weeks after receiving the prolia vaccine.  Sherron Ales, PA-C

## 2021-01-23 DIAGNOSIS — E1040 Type 1 diabetes mellitus with diabetic neuropathy, unspecified: Secondary | ICD-10-CM | POA: Diagnosis not present

## 2021-01-23 DIAGNOSIS — E785 Hyperlipidemia, unspecified: Secondary | ICD-10-CM | POA: Diagnosis not present

## 2021-01-23 DIAGNOSIS — E103493 Type 1 diabetes mellitus with severe nonproliferative diabetic retinopathy without macular edema, bilateral: Secondary | ICD-10-CM | POA: Diagnosis not present

## 2021-01-23 DIAGNOSIS — N1831 Chronic kidney disease, stage 3a: Secondary | ICD-10-CM | POA: Diagnosis not present

## 2021-01-27 ENCOUNTER — Other Ambulatory Visit: Payer: Self-pay

## 2021-01-27 DIAGNOSIS — Z79899 Other long term (current) drug therapy: Secondary | ICD-10-CM

## 2021-01-27 MED ORDER — ENBREL SURECLICK 50 MG/ML ~~LOC~~ SOAJ: SUBCUTANEOUS | 0 refills | Status: DC

## 2021-01-27 NOTE — Telephone Encounter (Signed)
Patient left a voicemail stating [facility] tried to contact the office regarding her prescription refill of Enbrel, but hasn't received a response.  Patient is also requesting a 3 month supply.

## 2021-01-27 NOTE — Telephone Encounter (Signed)
Received fax from [employer] this afternoon.   Next Visit: 03/18/2021  Last Visit: 10/16/2020  Last Fill: 12/10/2020  WI:OXBDZHGDJM arthritis with rheumatoid factor of multiple sites without organ or systems involvement  Current Dose per office note 10/16/2020:  Enbrel 50 mg subcutaneous injections every 7 days.  Labs: 12/17/2020 Glucose is mildly elevated, GFR is low and stable.  CBC is normal.   TB Gold: 07/17/2020 Neg   Okay to refill Enbrel?

## 2021-02-05 DIAGNOSIS — M791 Myalgia, unspecified site: Secondary | ICD-10-CM | POA: Diagnosis not present

## 2021-02-05 DIAGNOSIS — G518 Other disorders of facial nerve: Secondary | ICD-10-CM | POA: Diagnosis not present

## 2021-02-05 DIAGNOSIS — M542 Cervicalgia: Secondary | ICD-10-CM | POA: Diagnosis not present

## 2021-02-05 DIAGNOSIS — G43719 Chronic migraine without aura, intractable, without status migrainosus: Secondary | ICD-10-CM | POA: Diagnosis not present

## 2021-03-05 NOTE — Progress Notes (Deleted)
Office Visit Note  [name]             Date of Birth: Nov 13, 1955           MRN: [id number]             PCP: Adrian Prince, MD Referring: Adrian Prince, MD Visit Date: 03/18/2021 Occupation: @GUAROCC @  Subjective:  No chief complaint on file.   History of Present Illness: Becky [name] is a 65 y.o. female ***   Activities of Daily Living:  Patient reports morning stiffness for *** {minute/hour:19697}.   Patient {ACTIONS;DENIES/REPORTS:21021675::"Denies"} nocturnal pain.  Difficulty dressing/grooming: {ACTIONS;DENIES/REPORTS:21021675::"Denies"} Difficulty climbing stairs: {ACTIONS;DENIES/REPORTS:21021675::"Denies"} Difficulty getting out of chair: {ACTIONS;DENIES/REPORTS:21021675::"Denies"} Difficulty using hands for taps, buttons, cutlery, and/or writing: {ACTIONS;DENIES/REPORTS:21021675::"Denies"}  No Rheumatology ROS completed.   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 determined by examination associated with type 1 diabetes mellitus (HCC) 03/18/2020   Stable treated proliferative diabetic retinopathy of right eye determined by examination associated with type 2 diabetes mellitus (HCC) 03/18/2020   History of vitrectomy 03/18/2020   Complicated UTI (urinary tract infection) 12/13/2018   Failure of outpatient treatment 12/11/2018   Acute metabolic encephalopathy 12/11/2018   Fever 12/10/2018   Breast cancer screening, high risk patient 05/09/2018   Eosinophilia 01/31/2017   Iritis 01/04/2017   History of joint swelling 08/24/2016   Osteoarthritis of knees, bilateral 08/24/2016   History of migraine 08/24/2016   History of  depression 08/24/2016   History of osteoporosis 08/24/2016   DDD (degenerative disc disease), lumbar 08/24/2016   High risk medication use 08/24/2016   Trigger finger, left middle finger 08/24/2016   Trigger finger, right ring finger 08/24/2016   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   Increased risk of breast cancer 07/06/2015   IDDM (insulin dependent diabetes mellitus) 07/06/2015   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 without organ or systems involvement (HCC) 07/06/2015   Sjogren's syndrome (HCC) 07/06/2015   Genetic testing 05/12/2015   Family history of breast cancer 04/15/2015   Fibrocystic breast disease 10/11/2012   GERD 10/01/2009   Chest pain with moderate risk for cardiac etiology 09/02/2009   DYSPHAGIA 09/02/2009   ANXIETY 08/15/2009   ULCER-GASTRIC 08/15/2009   CONSTIPATION 08/15/2009   SLOW TRANSIT CONSTIPATION 08/15/2009   FLATULENCE ERUCTATION AND GAS PAIN 08/15/2009    Past Medical History:  Diagnosis Date   Abnormal ECG    Anemia    Anxiety    Cataract    . Cateracts bil eyes removed   GERD (gastroesophageal reflux disease)    Heart murmur    History of COVID-19 06/14/2020   Hypercholesterolemia    IDDM (insulin dependent diabetes mellitus) 1969   Migraine headache    Neuromuscular disorder (HCC)    bil legs & feet - neuropathy   Neuropathy    Osteoporosis    10/2012   Otosclerosis    RA (rheumatoid arthritis) (HCC) 2000   S/P cardiac cath August 2013   Normal coronaries and normal LV function.   Sjoegren syndrome     Family History  Problem Relation Age of Onset   Breast cancer Mother 47   Colon polyps Mother        unspecified  number   Uterine cancer Mother        metastatic   Breast cancer Maternal Grandmother 30   Stroke Father 40   Rheum arthritis Sister    Breast cancer Sister 48       lump; (-)GT (breast/ova ca panel, [facility])   Skin cancer Maternal Grandfather        unspecified type   Prostate cancer Maternal  Grandfather        dx. late 90s   Breast cancer Other        dx. in their late 60s   Breast cancer Other        dx. 78-79   Breast cancer Cousin        bilateral; dx. late 14s   Colon cancer Neg Hx    Esophageal cancer Neg Hx    Rectal cancer Neg Hx    Stomach cancer Neg Hx    Past Surgical History:  Procedure Laterality Date   back ablation  03/2020   back ablation  10/09/2020   BREAST LUMPECTOMY  1995   CARPAL TUNNEL RELEASE  2000   CATARACT EXTRACTION Right 09-05-12   CATARACT EXTRACTION Left 3/15   CESAREAN SECTION  1987   COLONOSCOPY     EYE SURGERY     diabetic retinopathy    HYSTEROSCOPY  02/2001   and D & C   LYMPH NODE DISSECTION  2002   TOE SURGERY  2000   bone removed   Social History   Social History Narrative   Not on file   Immunization History  Administered Date(s) Administered   PFIZER(Purple Top)SARS-COV-2 Vaccination 07/11/2019, 08/01/2019, 02/05/2020, 09/15/2020   Tdap 06/21/2013     Objective: Vital Signs: LMP 09/19/2008    Physical Exam   Musculoskeletal Exam: ***  CDAI Exam: CDAI Score: -- Patient Global: --; Provider Global: -- Swollen: --; Tender: -- Joint Exam 03/18/2021   No joint exam has been documented for this visit   There is currently no information documented on the homunculus. Go to the Rheumatology activity and complete the homunculus joint exam.  Investigation: No additional findings.  Imaging: No results found.  Recent Labs: Lab Results  Component Value Date   WBC 5.5 12/17/2020   HGB 13.5 12/17/2020   PLT 258 12/17/2020   NA 139 12/17/2020   K 4.4 12/17/2020   CL 106 12/17/2020   CO2 26 12/17/2020   GLUCOSE 131 (H) 12/17/2020   BUN 19 12/17/2020   CREATININE 1.04 (H) 12/17/2020   BILITOT 0.4 12/17/2020   ALKPHOS 79 05/13/2020   AST 23 12/17/2020   ALT 18 12/17/2020   PROT 7.0 12/17/2020   ALBUMIN 3.9 05/13/2020   CALCIUM 9.4 12/17/2020   GFRAA 66 12/17/2020   QFTBGOLD NEGATIVE 04/06/2017    QFTBGOLDPLUS NEGATIVE 07/17/2020    Speciality Comments: Recieves Enbrel [employer] through [employer]. Prolia start 01/12/21  Procedures:  No procedures performed Allergies: Chlorhexidine, Macrobid [nitrofurantoin macrocrystal], Ciprofloxacin, and Penicillins   Assessment / Plan:     Visit Diagnoses: No diagnosis found.  Orders: No orders of the defined types were placed in this encounter.  No orders of the defined types were placed in this encounter.   Face-to-face time spent with patient was *** minutes. Greater than 50% of time was spent in counseling and coordination of care.  Follow-Up Instructions: No follow-ups on file.   Ellen Henri, CMA  Note - This record has been created using Animal nutritionist.  Chart creation  errors have been sought, but may not always  have been located. Such creation errors do not reflect on  the standard of medical care.

## 2021-03-18 ENCOUNTER — Ambulatory Visit: Payer: Medicare Other | Admitting: Rheumatology

## 2021-03-18 DIAGNOSIS — M65312 Trigger thumb, left thumb: Secondary | ICD-10-CM

## 2021-03-18 DIAGNOSIS — Z8669 Personal history of other diseases of the nervous system and sense organs: Secondary | ICD-10-CM

## 2021-03-18 DIAGNOSIS — M3501 Sicca syndrome with keratoconjunctivitis: Secondary | ICD-10-CM

## 2021-03-18 DIAGNOSIS — Z8739 Personal history of other diseases of the musculoskeletal system and connective tissue: Secondary | ICD-10-CM

## 2021-03-18 DIAGNOSIS — Z79899 Other long term (current) drug therapy: Secondary | ICD-10-CM

## 2021-03-18 DIAGNOSIS — Z8659 Personal history of other mental and behavioral disorders: Secondary | ICD-10-CM

## 2021-03-18 DIAGNOSIS — R5383 Other fatigue: Secondary | ICD-10-CM

## 2021-03-18 DIAGNOSIS — Z8639 Personal history of other endocrine, nutritional and metabolic disease: Secondary | ICD-10-CM

## 2021-03-18 DIAGNOSIS — U071 COVID-19: Secondary | ICD-10-CM

## 2021-03-18 DIAGNOSIS — M170 Bilateral primary osteoarthritis of knee: Secondary | ICD-10-CM

## 2021-03-18 DIAGNOSIS — M5136 Other intervertebral disc degeneration, lumbar region: Secondary | ICD-10-CM

## 2021-03-18 DIAGNOSIS — M0579 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 without organ or systems involvement: Secondary | ICD-10-CM

## 2021-03-18 DIAGNOSIS — Z8719 Personal history of other diseases of the digestive system: Secondary | ICD-10-CM

## 2021-03-18 DIAGNOSIS — M5030 Other cervical disc degeneration, unspecified cervical region: Secondary | ICD-10-CM

## 2021-03-18 DIAGNOSIS — H209 Unspecified iridocyclitis: Secondary | ICD-10-CM

## 2021-03-24 NOTE — Progress Notes (Signed)
Office Visit Note  [name]             Date of Birth: Nov 12, 1955           MRN: [id number]             PCP: Adrian Prince, MD Referring: Adrian Prince, MD Visit Date: 04/07/2021 Occupation: @GUAROCC @  Subjective:  Medication monitoring   History of Present Illness: AILYNN [name] is a 65 y.o. female with history of seropositive rheumatoid arthritis, Sjogren's syndrome, iritis, and osteoarthritis.  She is on Enbrel 50 mg subcutaneous injections once weekly. She denies any recent rheumatoid arthritis flares.  She states she recently held Enbrel for 3 weeks after getting the Shingrix vaccination and did not notice any increased joint pain or joint swelling during that time.  She states that she has noticed significant improvement in her knee joint pain after having Visco gel injections performed in both knees in July 2022.  She has had less difficulty rising from a seated position as well as climbing steps after completing the series.  She states that her right trigger thumb resolved after having a cortisone injection performed on 01/07/2021.  She had some discomfort in the left first MCP joint but denies any joint swelling.  She states that her lower back pain has been tolerable since having the ablation performed.  She has not had any signs or symptoms of an iritis flare.  She continues to have chronic dry mouth and dry eyes associated with Sjogren's syndrome.  Overall her sicca symptoms have been tolerable. She is prescribed Prolia 60 mg every days injections every 6 months by Dr. 01/09/2021.  Her next injection will be due in February 2023 per patient.  She continues to take a calcium and vitamin D supplement daily.  She denies any recent falls or fractures.      Activities of Daily Living:  Patient reports morning stiffness for 30-60 minutes  Patient Denies nocturnal pain.  Difficulty dressing/grooming: Denies Difficulty climbing stairs: Denies Difficulty getting out of  chair: Denies Difficulty using hands for taps, buttons, cutlery, and/or writing: Denies  Review of Systems  Constitutional:  Positive for fatigue.  HENT:  Positive for mouth dryness. Negative for mouth sores and nose dryness.   Eyes:  Positive for dryness. Negative for pain and visual disturbance.  Respiratory:  Negative for cough, hemoptysis, shortness of breath and difficulty breathing.   Cardiovascular:  Negative for chest pain, palpitations, hypertension and swelling in legs/feet.  Gastrointestinal:  Positive for constipation. Negative for blood in stool and diarrhea.  Endocrine: Negative for increased urination.  Genitourinary:  Negative for painful urination.  Musculoskeletal:  Positive for joint pain, joint pain and morning stiffness. Negative for joint swelling, myalgias, muscle weakness, muscle tenderness and myalgias.  Skin:  Negative for color change, pallor, rash, hair loss, nodules/bumps, skin tightness, ulcers and sensitivity to sunlight.  Allergic/Immunologic: Negative for susceptible to infections.  Neurological:  Negative for dizziness, numbness, headaches and weakness.  Hematological:  Negative for swollen glands.  Psychiatric/Behavioral:  Positive for sleep disturbance. Negative for depressed mood. The patient is not nervous/anxious.    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 determined by examination  associated with type 1 diabetes mellitus (HCC) 03/18/2020   Stable treated proliferative diabetic retinopathy of right eye determined by examination associated with type 2 diabetes mellitus (HCC) 03/18/2020   History of vitrectomy 03/18/2020    Complicated UTI (urinary tract infection) 12/13/2018   Failure of outpatient treatment 12/11/2018   Acute metabolic encephalopathy 12/11/2018   Fever 12/10/2018   Breast cancer screening, high risk patient 05/09/2018   Eosinophilia 01/31/2017   Iritis 01/04/2017   History of joint swelling 08/24/2016   Osteoarthritis of knees, bilateral 08/24/2016   History of migraine 08/24/2016   History of depression 08/24/2016   History of osteoporosis 08/24/2016   DDD (degenerative disc disease), lumbar 08/24/2016   High risk medication use 08/24/2016   Trigger finger, left middle finger 08/24/2016   Trigger finger, right ring finger 08/24/2016   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   Increased risk of breast cancer 07/06/2015   IDDM (insulin dependent diabetes mellitus) 07/06/2015   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 without organ or systems involvement (HCC) 07/06/2015   Sjogren's syndrome (HCC) 07/06/2015   Genetic testing 05/12/2015   Family history of breast cancer 04/15/2015   Fibrocystic breast disease 10/11/2012   GERD 10/01/2009   Chest pain with moderate risk for cardiac etiology 09/02/2009   DYSPHAGIA 09/02/2009   ANXIETY 08/15/2009   ULCER-GASTRIC 08/15/2009   CONSTIPATION 08/15/2009   SLOW TRANSIT CONSTIPATION 08/15/2009   FLATULENCE ERUCTATION AND GAS PAIN 08/15/2009    Past Medical History:  Diagnosis Date   Abnormal ECG    Anemia    Anxiety    Cataract    . Cateracts bil eyes removed   GERD (gastroesophageal reflux disease)    Heart murmur    History of COVID-19 06/14/2020   Hypercholesterolemia    IDDM (insulin dependent diabetes mellitus) 1969   Migraine headache    Neuromuscular disorder (HCC)    bil legs & feet - neuropathy   Neuropathy    Osteoporosis    10/2012   Otosclerosis    RA (rheumatoid arthritis) (HCC) 2000   S/P cardiac cath August 2013   Normal coronaries and normal LV function.   Sjoegren syndrome     Family History   Problem Relation Age of Onset   Breast cancer Mother 33   Colon polyps Mother        unspecified number   Uterine cancer Mother        metastatic   Breast cancer Maternal Grandmother 57   Stroke Father 45   Rheum arthritis Sister    Breast cancer Sister 48       lump; (-)GT (breast/ova ca panel, GeneDx)   Skin cancer Maternal Grandfather        unspecified type   Prostate cancer Maternal Grandfather        dx. late 1s   Breast cancer Other        dx. in their late 60s   Breast cancer Other        dx. 78-79   Breast cancer Cousin        bilateral; dx. late 50s   Colon cancer Neg Hx    Esophageal cancer Neg Hx    Rectal cancer Neg Hx    Stomach cancer Neg Hx    Past Surgical History:  Procedure Laterality Date   back ablation  03/2020   back ablation  10/09/2020   BREAST LUMPECTOMY  1995   CARPAL TUNNEL RELEASE  2000   CATARACT EXTRACTION Right 09-05-12  CATARACT EXTRACTION Left 3/15   CESAREAN SECTION  1987   COLONOSCOPY     EYE SURGERY     diabetic retinopathy    HYSTEROSCOPY  02/2001   and D & C   LYMPH NODE DISSECTION  2002   TOE SURGERY  2000   bone removed   Social History   Social History Narrative   Not on file   Immunization History  Administered Date(s) Administered   PFIZER(Purple Top)SARS-COV-2 Vaccination 07/11/2019, 08/01/2019, 02/05/2020, 09/15/2020   Tdap 06/21/2013     Objective: Vital Signs: BP 117/62 (BP Location: Left Arm, Patient Position: Sitting, Cuff Size: Normal)   Pulse 88   Resp 15   Ht 5\' 4"  (1.626 m)   Wt 126 lb (57.2 kg)   LMP 09/19/2008   BMI 21.63 kg/m    Physical Exam Vitals and nursing note reviewed.  Constitutional:      Appearance: She is well-developed.  HENT:     Head: Normocephalic and atraumatic.  Eyes:     Conjunctiva/sclera: Conjunctivae normal.  Pulmonary:     Effort: Pulmonary effort is normal.  Abdominal:     Palpations: Abdomen is soft.  Musculoskeletal:     Cervical back: Normal range of  motion.  Skin:    General: Skin is warm and dry.     Capillary Refill: Capillary refill takes less than 2 seconds.  Neurological:     Mental Status: She is alert and oriented to person, place, and time.  Psychiatric:        Behavior: Behavior normal.     Musculoskeletal Exam: C-spine is slightly limited range of motion with lateral rotation.  Shoulder joints, elbow joints, wrist joints, MCPs, PIPs, DIPs have good range of motion with no synovitis.  Some tenderness over the left first MCP joint.  Complete fist formation bilaterally.  PIP and DIP thickening consistent with osteoarthritis of both hands noted.  Hip joints have good range of motion with no discomfort.  No tenderness over trochanteric bursa bilaterally.  Knee joints have good range of motion with no warmth or effusion.  Ankle joints have good range of motion with no tenderness or joint swelling.  CDAI Exam: CDAI Score: 0.6  Patient Global: 3 mm; Provider Global: 3 mm Swollen: 0 ; Tender: 0  Joint Exam 04/07/2021   No joint exam has been documented for this visit   There is currently no information documented on the homunculus. Go to the Rheumatology activity and complete the homunculus joint exam.  Investigation: No additional findings.  Imaging: No results found.  Recent Labs: Lab Results  Component Value Date   WBC 5.5 12/17/2020   HGB 13.5 12/17/2020   PLT 258 12/17/2020   NA 139 12/17/2020   K 4.4 12/17/2020   CL 106 12/17/2020   CO2 26 12/17/2020   GLUCOSE 131 (H) 12/17/2020   BUN 19 12/17/2020   CREATININE 1.04 (H) 12/17/2020   BILITOT 0.4 12/17/2020   ALKPHOS 79 05/13/2020   AST 23 12/17/2020   ALT 18 12/17/2020   PROT 7.0 12/17/2020   ALBUMIN 3.9 05/13/2020   CALCIUM 9.4 12/17/2020   GFRAA 66 12/17/2020   QFTBGOLD NEGATIVE 04/06/2017   QFTBGOLDPLUS NEGATIVE 07/17/2020    Speciality Comments: Recieves Enbrel Sureclick through 07/19/2020. Prolia start 01/12/21  Procedures:  No  procedures performed Allergies: Chlorhexidine, Macrobid [nitrofurantoin macrocrystal], Ciprofloxacin, and Penicillins   Assessment / Plan:     Visit Diagnoses: 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  without organ or systems involvement [facility]): She has no joint tenderness or synovitis on examination today.  She has not had any signs or symptoms of a rheumatoid arthritis flare.  She is clinically doing well on Enbrel 50 mg subcutaneous injections once weekly.  She recently held Enbrel for 3 weeks around the time of receiving the first Shingrix vaccination.  She did not notice any increased joint pain, stiffness, or inflammation while off of Enbrel.  She has no significant improvement in her knee joint pain secondary to osteoarthritis after undergoing Visco gel injections in July 2022.  She had good range of motion of both knee joints with no warmth or effusion on examination today.  Overall her rheumatoid arthritis seems well controlled on the current treatment regimen.  No medication changes will be made at this time.  She was advised to notify us if she develops increased joint pain or joint swelling.  She will follow-up in the office in 5 months.  High risk medication use - Enbrel SureClick 50 mg every 7 days, discontinued methotrexate & folic acid in May 2021.  CBC and CMP were drawn on 12/17/2020.  She is overdue to update lab work.  Orders for CBC and CMP were released.  Her next lab work will be due in January and every 3 months to monitor for drug toxicity.  Standing orders for CBC and CMP remain in place.  TB Gold negative on 07/17/2020 and will continue to be monitored yearly.  Future order for TB gold will be placed today. - Plan: CBC with Differential/Platelet, COMPLETE METABOLIC PANEL WITH GFR, QuantiFERON-TB Gold Plus She has not had any recent infections.  Discussed the importance of holding Enbrel if she develops signs or symptoms of an infection and to resume once the  infection is completely cleared. She has received the annual influenza vaccination.  She is also received the first vaccine of the Shingrix series.  She was given a prescription for the second dose to be administered. Discussed the importance of yearly skin examinations while on Enbrel due to the increased risk for nonmelanoma skin cancer.  She has been seeing her dermatologist every 6 months.  Screening for tuberculosis -Future order for TB gold placed today.  Plan: QuantiFERON-TB Gold Plus  Iritis: She has not had any signs or symptoms of an iritis flare recently.  She is not experiencing any eye pain, increased photophobia, or conjunctival injection.  Sjogren's syndrome with keratoconjunctivitis sicca (HCC): She has ongoing sicca symptoms but overall her symptoms have been tolerable.  Trigger thumb, left thumb: Resolved after having a cortisone injection performed on 01/07/2021.  Primary osteoarthritis of both knees - s/p synvisc bilateral knees 12/2020.  She has good range of motion of both knee joints on examination today.  No warmth or effusion was noted.  She notices significant improvement in her knee joint pain and stiffness after undergoing the Synvisc series.  She has had less difficulty rising from a seated position as well as climbing steps.  She was advised to notify us when she would like to reapply for Visco gel injections for both knees in the future.  DDD (degenerative disc disease), cervical: She has limited range of motion with lateral rotation.  No symptoms of radiculopathy currently.  DDD (degenerative disc disease), lumbar - Followed by Dr. Otelia Sergeant and Dr. Alvester Morin.  She underwent a radiofrequency ablation on 10/09/2020 by Dr. Alvester Morin which has resolved her lower back pain.  Other fatigue: Stable  History of osteoporosis - DEXA  ordered by Dr. Evlyn Kanner. Previously on prolia x2 doses.  She will be due for her next Prolia injection in February 2023 per patient.  She continues to take a  calcium and vitamin D supplement on a daily basis.  She has not had any recent falls or fractures.  Other medical conditions are listed as follows:  History of migraine  History of gastroesophageal reflux (GERD)  History of diabetes mellitus  History of depression  COVID-19 virus infection - January 2022.     Orders: Orders Placed This Encounter  Procedures   CBC with Differential/Platelet   COMPLETE METABOLIC PANEL WITH GFR   QuantiFERON-TB Gold Plus    No orders of the defined types were placed in this encounter.     Follow-Up Instructions: Return in about 5 months (around 09/05/2021) for Rheumatoid arthritis, Sjogren's syndrome, Osteoarthritis.   Gearldine Bienenstock, PA-C  Note - This record has been created using Dragon software.  Chart creation errors have been sought, but may not always  have been located. Such creation errors do not reflect on  the standard of medical care.

## 2021-03-31 DIAGNOSIS — M791 Myalgia, unspecified site: Secondary | ICD-10-CM | POA: Diagnosis not present

## 2021-03-31 DIAGNOSIS — G43719 Chronic migraine without aura, intractable, without status migrainosus: Secondary | ICD-10-CM | POA: Diagnosis not present

## 2021-03-31 DIAGNOSIS — M542 Cervicalgia: Secondary | ICD-10-CM | POA: Diagnosis not present

## 2021-03-31 DIAGNOSIS — G518 Other disorders of facial nerve: Secondary | ICD-10-CM | POA: Diagnosis not present

## 2021-04-02 DIAGNOSIS — Z1231 Encounter for screening mammogram for malignant neoplasm of breast: Secondary | ICD-10-CM | POA: Diagnosis not present

## 2021-04-03 ENCOUNTER — Encounter: Payer: Self-pay | Admitting: Obstetrics and Gynecology

## 2021-04-07 ENCOUNTER — Other Ambulatory Visit: Payer: Self-pay

## 2021-04-07 ENCOUNTER — Encounter: Payer: Self-pay | Admitting: Physician Assistant

## 2021-04-07 ENCOUNTER — Ambulatory Visit: Payer: Medicare Other | Admitting: Physician Assistant

## 2021-04-07 VITALS — BP 117/62 | HR 88 | Resp 15 | Ht 64.0 in | Wt 126.0 lb

## 2021-04-07 DIAGNOSIS — Z79899 Other long term (current) drug therapy: Secondary | ICD-10-CM

## 2021-04-07 DIAGNOSIS — Z8739 Personal history of other diseases of the musculoskeletal system and connective tissue: Secondary | ICD-10-CM

## 2021-04-07 DIAGNOSIS — U071 COVID-19: Secondary | ICD-10-CM

## 2021-04-07 DIAGNOSIS — M5030 Other cervical disc degeneration, unspecified cervical region: Secondary | ICD-10-CM

## 2021-04-07 DIAGNOSIS — Z111 Encounter for screening for respiratory tuberculosis: Secondary | ICD-10-CM

## 2021-04-07 DIAGNOSIS — Z8659 Personal history of other mental and behavioral disorders: Secondary | ICD-10-CM

## 2021-04-07 DIAGNOSIS — M170 Bilateral primary osteoarthritis of knee: Secondary | ICD-10-CM

## 2021-04-07 DIAGNOSIS — M65312 Trigger thumb, left thumb: Secondary | ICD-10-CM

## 2021-04-07 DIAGNOSIS — Z8719 Personal history of other diseases of the digestive system: Secondary | ICD-10-CM

## 2021-04-07 DIAGNOSIS — Z8639 Personal history of other endocrine, nutritional and metabolic disease: Secondary | ICD-10-CM

## 2021-04-07 DIAGNOSIS — M0579 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 without organ or systems involvement: Secondary | ICD-10-CM | POA: Diagnosis not present

## 2021-04-07 DIAGNOSIS — H209 Unspecified iridocyclitis: Secondary | ICD-10-CM | POA: Diagnosis not present

## 2021-04-07 DIAGNOSIS — R5383 Other fatigue: Secondary | ICD-10-CM

## 2021-04-07 DIAGNOSIS — M5136 Other intervertebral disc degeneration, lumbar region: Secondary | ICD-10-CM

## 2021-04-07 DIAGNOSIS — Z8669 Personal history of other diseases of the nervous system and sense organs: Secondary | ICD-10-CM

## 2021-04-07 DIAGNOSIS — M3501 Sicca syndrome with keratoconjunctivitis: Secondary | ICD-10-CM

## 2021-04-07 NOTE — Patient Instructions (Signed)
Standing Labs We placed an order today for your standing lab work.   Please have your standing labs drawn in January and every 3 months   If possible, please have your labs drawn 2 weeks prior to your appointment so that the provider can discuss your results at your appointment.  Please note that you may see your imaging and lab results in MyChart before we have reviewed them. We may be awaiting multiple results to interpret others before contacting you. Please allow our office up to 72 hours to thoroughly review all of the results before contacting the office for clarification of your results.  We have open lab daily: Monday through Thursday from 1:30-4:30 PM and Friday from 1:30-4:00 PM at the office of Dr. Shaili Deveshwar, [facility].   Please be advised, all patients with office appointments requiring lab work will take precedent over walk-in lab work.  If possible, please come for your lab work on Monday and Friday afternoons, as you may experience shorter wait times. The office is located at [address], [address], [place], [place] [address] No appointment is necessary.   Labs are drawn by [facility]. Please bring your co-pay at the time of your lab draw.  You may receive a bill from [facility] for your lab work.  If you wish to have your labs drawn at another location, please call the office 24 hours in advance to send orders.  If you have any questions regarding directions or hours of operation,  please call [phone].   As a reminder, please drink plenty of water prior to coming for your lab work. Thanks!  

## 2021-04-08 LAB — COMPLETE METABOLIC PANEL WITH GFR
AG Ratio: 1.4 (calc) (ref 1.0–2.5)
ALT: 27 U/L (ref 6–29)
AST: 28 U/L (ref 10–35)
Albumin: 4 g/dL (ref 3.6–5.1)
Alkaline phosphatase (APISO): 53 U/L (ref 37–153)
BUN: 24 mg/dL (ref 7–25)
CO2: 26 mmol/L (ref 20–32)
Calcium: 9.5 mg/dL (ref 8.6–10.4)
Chloride: 103 mmol/L (ref 98–110)
Creat: 1 mg/dL (ref 0.50–1.05)
Globulin: 2.9 g/dL (calc) (ref 1.9–3.7)
Glucose, Bld: 287 mg/dL — ABNORMAL HIGH (ref 65–99)
Potassium: 4.4 mmol/L (ref 3.5–5.3)
Sodium: 138 mmol/L (ref 135–146)
Total Bilirubin: 0.5 mg/dL (ref 0.2–1.2)
Total Protein: 6.9 g/dL (ref 6.1–8.1)
eGFR: 63 mL/min/{1.73_m2} (ref >=60)

## 2021-04-08 LAB — CBC WITH DIFFERENTIAL/PLATELET
Absolute Monocytes: 336 cells/uL (ref 200–950)
Basophils Absolute: 81 cells/uL (ref 0–200)
Basophils Relative: 1.4 %
Eosinophils Absolute: 331 cells/uL (ref 15–500)
Eosinophils Relative: 5.7 %
HCT: 42 % (ref 35.0–45.0)
Hemoglobin: 13.9 g/dL (ref 11.7–15.5)
Lymphs Abs: 1531 cells/uL (ref 850–3900)
MCH: 30.4 pg (ref 27.0–33.0)
MCHC: 33.1 g/dL (ref 32.0–36.0)
MCV: 91.9 fL (ref 80.0–100.0)
MPV: 13.6 fL — ABNORMAL HIGH (ref 7.5–12.5)
Monocytes Relative: 5.8 %
Neutro Abs: 3521 cells/uL (ref 1500–7800)
Neutrophils Relative %: 60.7 %
Platelets: 249 10*3/uL (ref 140–400)
RBC: 4.57 10*6/uL (ref 3.80–5.10)
RDW: 13.1 % (ref 11.0–15.0)
Total Lymphocyte: 26.4 %
WBC: 5.8 10*3/uL (ref 3.8–10.8)

## 2021-04-08 NOTE — Progress Notes (Signed)
Glucose is elevated-287. Rest of CMP WNL.  CBC WNL.

## 2021-04-20 DIAGNOSIS — M5416 Radiculopathy, lumbar region: Secondary | ICD-10-CM | POA: Diagnosis not present

## 2021-04-21 ENCOUNTER — Ambulatory Visit: Payer: Medicare Other | Admitting: Oncology

## 2021-04-21 ENCOUNTER — Other Ambulatory Visit: Payer: Medicare Other

## 2021-04-29 DIAGNOSIS — H04123 Dry eye syndrome of bilateral lacrimal glands: Secondary | ICD-10-CM | POA: Diagnosis not present

## 2021-04-29 DIAGNOSIS — Z961 Presence of intraocular lens: Secondary | ICD-10-CM | POA: Diagnosis not present

## 2021-05-11 ENCOUNTER — Telehealth: Payer: Self-pay | Admitting: Pharmacist

## 2021-05-11 NOTE — Telephone Encounter (Signed)
Patient dropped off Enbrel PAP renewal application for [employer].   Submitted Patient Assistance Application to [employer] for ENBREL along with provider portion, patient portion, med list, insurance card copy, and PA. Will update patient when we receive a response.  Fax# [phone] Phone# [phone]     Chesley Mires, PharmD, MPH, BCPS Clinical Pharmacist (Rheumatology and Pulmonology)

## 2021-05-22 NOTE — Telephone Encounter (Signed)
Called [employer] for update on patient's Enbrel PAP renewal application. Per rep, they do not have application in their system. Will refax application to [employer] today  Fax# [phone] Phone# [phone]  Shermaine Rivet, PharmD, MPH, BCPS Clinical Pharmacist (Rheumatology and Pulmonology) 

## 2021-05-26 DIAGNOSIS — N1831 Chronic kidney disease, stage 3a: Secondary | ICD-10-CM | POA: Diagnosis not present

## 2021-05-26 DIAGNOSIS — I129 Hypertensive chronic kidney disease with stage 1 through stage 4 chronic kidney disease, or unspecified chronic kidney disease: Secondary | ICD-10-CM | POA: Diagnosis not present

## 2021-05-26 DIAGNOSIS — E103493 Type 1 diabetes mellitus with severe nonproliferative diabetic retinopathy without macular edema, bilateral: Secondary | ICD-10-CM | POA: Diagnosis not present

## 2021-05-26 DIAGNOSIS — E1040 Type 1 diabetes mellitus with diabetic neuropathy, unspecified: Secondary | ICD-10-CM | POA: Diagnosis not present

## 2021-05-28 NOTE — Telephone Encounter (Addendum)
Called [employer] to f/u Enbrel PAP renewal application -   Received a verbal confirmation from [employer] regarding an approval for ENBREL patient assistance from 05/25/21 to 06/20/22.  Awaiting faxed approval letter  Phone number: [phone]  Chesley Mires, PharmD, MPH, BCPS Clinical Pharmacist (Rheumatology and Pulmonology)

## 2021-06-04 ENCOUNTER — Other Ambulatory Visit: Payer: Self-pay | Admitting: *Deleted

## 2021-06-04 DIAGNOSIS — Z79899 Other long term (current) drug therapy: Secondary | ICD-10-CM

## 2021-06-04 MED ORDER — ENBREL SURECLICK 50 MG/ML ~~LOC~~ SOAJ: SUBCUTANEOUS | 0 refills | Status: DC

## 2021-06-04 NOTE — Telephone Encounter (Signed)
Refill request received via fax  Next Visit: 09/09/2021  Last Visit: 04/07/2021  Last Fill: 01/27/2021  DX: 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 without organ or systems involvement   Current Dose per office note 04/07/2021: Enbrel SureClick 50 mg every 7 days  Labs: 04/07/2021 Glucose is elevated-287. Rest of CMP WNL.  CBC WNL.   TB Gold: 07/17/2020 Neg    Okay to refill Enbrel?

## 2021-06-10 ENCOUNTER — Other Ambulatory Visit: Payer: Medicare Other

## 2021-06-10 ENCOUNTER — Ambulatory Visit: Payer: Medicare Other | Admitting: Oncology

## 2021-06-29 ENCOUNTER — Telehealth: Payer: Self-pay

## 2021-06-29 NOTE — Telephone Encounter (Signed)
I relayed Dr. Rica RecordsSilva's reply to patient. Patient said when she received letter about Dr. Darnelle CatalanMagrinat retiring she called and spoke with a nurse and asked if she needed to see another Oncologist. She said she goes once a year and Dr. Darnelle CatalanMagrinat orders lab work and schedules the MRI.  The nurse checked with another MD and came back and said that MD said she does not necessarily have to see an onc that Dr. Edward JollySilva could schedule the MRI yearly. He told her to be sure she has a yearly CBC and she said Dr. Evlyn KannerSouth checks that about twice a year.

## 2021-06-29 NOTE — Telephone Encounter (Signed)
Options are for me to order her yearly MRI of the breast or for another oncologist to order this.  Many of Dr. Virgie Dad patients have chosen to continue care there with another oncology provider at the [facility].

## 2021-06-29 NOTE — Telephone Encounter (Signed)
Patient called. Dr. Darnelle Catalan has ordered her yearly breast MRIs for awhile but he has retired now. She wants to see if you will start ordering for her.  She usually has it Jan/Feb.  She is at increased risk for breast cancer per her chart note.

## 2021-06-29 NOTE — Telephone Encounter (Signed)
Patient called stating she is due for her Prolia injection.    Patient would also like to apply for gel injections for her bilateral knees.

## 2021-06-29 NOTE — Telephone Encounter (Signed)
Ok to reapply for visco gel injections for both knees.

## 2021-06-30 ENCOUNTER — Other Ambulatory Visit: Payer: Self-pay

## 2021-06-30 DIAGNOSIS — Z9189 Other specified personal risk factors, not elsewhere classified: Secondary | ICD-10-CM

## 2021-06-30 DIAGNOSIS — Z794 Long term (current) use of insulin: Secondary | ICD-10-CM | POA: Diagnosis not present

## 2021-06-30 DIAGNOSIS — E109 Type 1 diabetes mellitus without complications: Secondary | ICD-10-CM | POA: Diagnosis not present

## 2021-06-30 NOTE — Telephone Encounter (Signed)
I am happy to order her yearly MRI of the breast with and without contrast due to lifetime increased risk of breast cancer.   Please schedule an office visit with me for June, 2023.

## 2021-06-30 NOTE — Telephone Encounter (Signed)
Patient informed. MRI order placed and patient will call and schedule the MRI appt.  Phone # provided.  Appt desk will call her to schedule June 2023 visit.

## 2021-07-02 NOTE — Telephone Encounter (Signed)
Per Theodis BlazeWendy AEX scheduled 12/23/21.

## 2021-07-03 NOTE — Telephone Encounter (Signed)
VOB submitted fos Synvisc - bilateral knees BV pending

## 2021-07-07 ENCOUNTER — Telehealth: Payer: Self-pay

## 2021-07-07 DIAGNOSIS — M0579 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 without organ or systems involvement: Secondary | ICD-10-CM

## 2021-07-07 DIAGNOSIS — Z8739 Personal history of other diseases of the musculoskeletal system and connective tissue: Secondary | ICD-10-CM

## 2021-07-07 DIAGNOSIS — Z79899 Other long term (current) drug therapy: Secondary | ICD-10-CM

## 2021-07-07 NOTE — Telephone Encounter (Signed)
Patient called checking the status of her Prolia injection.  Patient states she is due on 07/15/21.  Patient requested a return call.

## 2021-07-08 NOTE — Telephone Encounter (Signed)
MRI scheduled 07/16/21 at [facility]. [facility].

## 2021-07-09 ENCOUNTER — Other Ambulatory Visit (HOSPITAL_COMMUNITY): Payer: Self-pay

## 2021-07-09 NOTE — Telephone Encounter (Signed)
Called patient - advised that we will need updated labs prior to receiving Prolia. She will plan to stop tomorrow for labs - CBC, CMP, and Vitamin D.  Prior authorization started through pharmacy benefit through [facility] via [place].  Key: [id number]  Started authorization through [facility] medical benefit via phone. Active from 06/21/21. Plan?follows Medicare guidelines and?covers 80% of the infusion and no authorization is required?for [id number]. Patient has an HMO plan all providers must be in network. [facility] and Dr. Estanislado Pandy is in Network. Patient does not have Medicare Supplement Plan, so patient will be required to pay the 20% coinsurance per each infusion which will be applied to her Max Out of Pocket- $3,700. Patient has met $100.18 as of 07/09/21.  Patient has no deductible.  Phone: [phone] Ref # [id number], Benjamine Mola T Ref #  Nic A 07/09/21  Knox Saliva, PharmD, MPH, BCPS Clinical Pharmacist (Rheumatology and Pulmonology)

## 2021-07-10 ENCOUNTER — Other Ambulatory Visit (HOSPITAL_COMMUNITY): Payer: Self-pay

## 2021-07-10 ENCOUNTER — Other Ambulatory Visit: Payer: Self-pay | Admitting: *Deleted

## 2021-07-10 ENCOUNTER — Telehealth: Payer: Self-pay

## 2021-07-10 DIAGNOSIS — Z8739 Personal history of other diseases of the musculoskeletal system and connective tissue: Secondary | ICD-10-CM | POA: Diagnosis not present

## 2021-07-10 DIAGNOSIS — Z111 Encounter for screening for respiratory tuberculosis: Secondary | ICD-10-CM

## 2021-07-10 DIAGNOSIS — Z79899 Other long term (current) drug therapy: Secondary | ICD-10-CM

## 2021-07-10 DIAGNOSIS — Z9225 Personal history of immunosupression therapy: Secondary | ICD-10-CM | POA: Diagnosis not present

## 2021-07-10 DIAGNOSIS — M810 Age-related osteoporosis without current pathological fracture: Secondary | ICD-10-CM | POA: Diagnosis not present

## 2021-07-10 NOTE — Telephone Encounter (Signed)
[employer] left a voicemail that PA for Prolia has been approved for coverage for a year until 07/09/2022.   Phone [phone] option 5

## 2021-07-10 NOTE — Telephone Encounter (Signed)
[facility] a prior authorization for PROLIA. Authorization has been APPROVED from 07/09/21 to 07/09/22.   Per test claim, copay for 180 days supply (1 syringe) is $270  Chesley Miresevki Carmel Waddington, PharmD, MPH, BCPS Clinical Pharmacist (Rheumatology and Pulmonology)

## 2021-07-10 NOTE — Telephone Encounter (Signed)
Please call to schedule Visco knee injections.  Authorized for Synvisc series Bilateral Knees. Buy and Bill No PA Insurance will cover 100% of injection after a $25 co pay each visit and 80% of medication charge.

## 2021-07-13 ENCOUNTER — Other Ambulatory Visit: Payer: Self-pay | Admitting: Pharmacist

## 2021-07-13 DIAGNOSIS — B078 Other viral warts: Secondary | ICD-10-CM | POA: Diagnosis not present

## 2021-07-13 DIAGNOSIS — Z8739 Personal history of other diseases of the musculoskeletal system and connective tissue: Secondary | ICD-10-CM

## 2021-07-13 DIAGNOSIS — L570 Actinic keratosis: Secondary | ICD-10-CM | POA: Diagnosis not present

## 2021-07-13 DIAGNOSIS — M810 Age-related osteoporosis without current pathological fracture: Secondary | ICD-10-CM

## 2021-07-13 DIAGNOSIS — L578 Other skin changes due to chronic exposure to nonionizing radiation: Secondary | ICD-10-CM | POA: Diagnosis not present

## 2021-07-13 DIAGNOSIS — L820 Inflamed seborrheic keratosis: Secondary | ICD-10-CM | POA: Diagnosis not present

## 2021-07-13 NOTE — Progress Notes (Signed)
Next dose not yet scheduled for Prolia SQ and due for updated orders. She was due on or after 07/11/21 Diagnosis:  age-related osteoporosis  Dose: 60mg  SQ every 6 months  Last Clinic Visit: 04/07/21 Next Clinic Visit: 09/09/21  Last dose: 01/12/21  Labs: CBC, CMP, and Vitamin D on 07/10/21 wnl to proceed with Prolia.  Orders placed for Prolia SQ x 1 dose. No pre-medications required.  Called patient and provided with phone number for: [facility] [phone]) Ginette Otto  Will follow-up to ensured scheduled and completed  Chesley Mires, PharmD, MPH, BCPS Clinical Pharmacist (Rheumatology and Pulmonology)

## 2021-07-13 NOTE — Telephone Encounter (Signed)
Reviewed Prolia BIV findings - through pharmacy and medical benefits. She would prefer to receive through medical benefit.  Prolia orders placed with [facility] today. Will f/u to ensure scheduled and completed  Chesley Mires, PharmD, MPH, BCPS Clinical Pharmacist (Rheumatology and Pulmonology)

## 2021-07-14 DIAGNOSIS — G43719 Chronic migraine without aura, intractable, without status migrainosus: Secondary | ICD-10-CM | POA: Diagnosis not present

## 2021-07-14 DIAGNOSIS — M791 Myalgia, unspecified site: Secondary | ICD-10-CM | POA: Diagnosis not present

## 2021-07-14 DIAGNOSIS — G518 Other disorders of facial nerve: Secondary | ICD-10-CM | POA: Diagnosis not present

## 2021-07-14 DIAGNOSIS — M542 Cervicalgia: Secondary | ICD-10-CM | POA: Diagnosis not present

## 2021-07-14 LAB — CBC WITH DIFFERENTIAL/PLATELET
Absolute Monocytes: 359 cells/uL (ref 200–950)
Basophils Absolute: 78 cells/uL (ref 0–200)
Basophils Relative: 1.5 %
Eosinophils Absolute: 390 cells/uL (ref 15–500)
Eosinophils Relative: 7.5 %
HCT: 42 % (ref 35.0–45.0)
Hemoglobin: 14 g/dL (ref 11.7–15.5)
Lymphs Abs: 1732 cells/uL (ref 850–3900)
MCH: 30 pg (ref 27.0–33.0)
MCHC: 33.3 g/dL (ref 32.0–36.0)
MCV: 89.9 fL (ref 80.0–100.0)
MPV: 13.2 fL — ABNORMAL HIGH (ref 7.5–12.5)
Monocytes Relative: 6.9 %
Neutro Abs: 2642 cells/uL (ref 1500–7800)
Neutrophils Relative %: 50.8 %
Platelets: 259 10*3/uL (ref 140–400)
RBC: 4.67 10*6/uL (ref 3.80–5.10)
RDW: 13.6 % (ref 11.0–15.0)
Total Lymphocyte: 33.3 %
WBC: 5.2 10*3/uL (ref 3.8–10.8)

## 2021-07-14 LAB — COMPREHENSIVE METABOLIC PANEL
AG Ratio: 1.4 (calc) (ref 1.0–2.5)
ALT: 21 U/L (ref 6–29)
AST: 27 U/L (ref 10–35)
Albumin: 4.2 g/dL (ref 3.6–5.1)
Alkaline phosphatase (APISO): 50 U/L (ref 37–153)
BUN: 18 mg/dL (ref 7–25)
CO2: 31 mmol/L (ref 20–32)
Calcium: 9.7 mg/dL (ref 8.6–10.4)
Chloride: 107 mmol/L (ref 98–110)
Creat: 0.99 mg/dL (ref 0.50–1.05)
Globulin: 2.9 g/dL (calc) (ref 1.9–3.7)
Glucose, Bld: 144 mg/dL — ABNORMAL HIGH (ref 65–99)
Potassium: 4.3 mmol/L (ref 3.5–5.3)
Sodium: 143 mmol/L (ref 135–146)
Total Bilirubin: 0.5 mg/dL (ref 0.2–1.2)
Total Protein: 7.1 g/dL (ref 6.1–8.1)

## 2021-07-14 LAB — QUANTIFERON-TB GOLD PLUS
Mitogen-NIL: >10 IU/mL
NIL: 0.03 IU/mL
QuantiFERON-TB Gold Plus: NEGATIVE
TB1-NIL: 0 IU/mL
TB2-NIL: 0 IU/mL

## 2021-07-14 LAB — VITAMIN D 25 HYDROXY (VIT D DEFICIENCY, FRACTURES): Vit D, 25-Hydroxy: 46 ng/mL (ref 30–100)

## 2021-07-15 NOTE — Progress Notes (Signed)
CBC is normal, CMP shows elevated glucose at 144.Vitamin D is 54 which is normal.

## 2021-07-15 NOTE — Progress Notes (Signed)
TB gold negative

## 2021-07-16 ENCOUNTER — Other Ambulatory Visit: Payer: Self-pay

## 2021-07-16 ENCOUNTER — Ambulatory Visit
Admission: RE | Admit: 2021-07-16 | Discharge: 2021-07-16 | Disposition: A | Discharge status: Home / Self Care | Payer: Medicare Other | Source: Ambulatory Visit | Attending: Obstetrics and Gynecology | Admitting: Obstetrics and Gynecology

## 2021-07-16 DIAGNOSIS — Z1239 Encounter for other screening for malignant neoplasm of breast: Secondary | ICD-10-CM | POA: Diagnosis not present

## 2021-07-16 DIAGNOSIS — Z803 Family history of malignant neoplasm of breast: Secondary | ICD-10-CM | POA: Diagnosis not present

## 2021-07-16 DIAGNOSIS — Z9189 Other specified personal risk factors, not elsewhere classified: Secondary | ICD-10-CM

## 2021-07-16 MED ORDER — GADOBUTROL 1 MMOL/ML IV SOLN
6.0000 mL | Freq: Once | INTRAVENOUS | Status: AC | PRN
  Administered 2021-07-16: 6 mL via INTRAVENOUS

## 2021-07-19 DIAGNOSIS — I10 Essential (primary) hypertension: Secondary | ICD-10-CM | POA: Diagnosis not present

## 2021-07-19 DIAGNOSIS — N1831 Chronic kidney disease, stage 3a: Secondary | ICD-10-CM | POA: Diagnosis not present

## 2021-07-19 DIAGNOSIS — M810 Age-related osteoporosis without current pathological fracture: Secondary | ICD-10-CM | POA: Diagnosis not present

## 2021-07-19 DIAGNOSIS — I129 Hypertensive chronic kidney disease with stage 1 through stage 4 chronic kidney disease, or unspecified chronic kidney disease: Secondary | ICD-10-CM | POA: Diagnosis not present

## 2021-07-24 NOTE — Progress Notes (Signed)
Patient scheduled Prolia dose to be received at [facility] on 07[facility]27/21. Will f/u to ensure completed  Chesley Miresevki Tykeisha Peer, PharmD, MPH, BCPS Clinical Pharmacist (Rheumatology and Pulmonology)

## 2021-07-27 ENCOUNTER — Other Ambulatory Visit: Payer: Self-pay

## 2021-07-27 ENCOUNTER — Ambulatory Visit (HOSPITAL_COMMUNITY)
Admission: RE | Admit: 2021-07-27 | Discharge: 2021-07-27 | Disposition: A | Discharge status: Home / Self Care | Payer: Medicare Other | Source: Ambulatory Visit | Attending: Rheumatology | Admitting: Rheumatology

## 2021-07-27 DIAGNOSIS — Z8739 Personal history of other diseases of the musculoskeletal system and connective tissue: Secondary | ICD-10-CM | POA: Diagnosis not present

## 2021-07-27 DIAGNOSIS — M810 Age-related osteoporosis without current pathological fracture: Secondary | ICD-10-CM | POA: Diagnosis not present

## 2021-07-27 MED ORDER — DENOSUMAB 60 MG/ML ~~LOC~~ SOSY
60.0000 mg | PREFILLED_SYRINGE | Freq: Once | SUBCUTANEOUS | Status: AC
Start: 2021-07-27 — End: 2021-07-27
  Administered 2021-07-27: 60 mg via SUBCUTANEOUS

## 2021-07-27 MED ORDER — DENOSUMAB 60 MG/ML ~~LOC~~ SOSY
PREFILLED_SYRINGE | SUBCUTANEOUS | Status: AC
  Filled 2021-07-27: qty 1

## 2021-08-06 NOTE — Telephone Encounter (Signed)
Patient is in [place] and will call back to schedule next week.

## 2021-08-11 DIAGNOSIS — H903 Sensorineural hearing loss, bilateral: Secondary | ICD-10-CM | POA: Diagnosis not present

## 2021-08-24 ENCOUNTER — Other Ambulatory Visit: Payer: Self-pay

## 2021-08-24 ENCOUNTER — Ambulatory Visit: Payer: Medicare Other | Admitting: Physician Assistant

## 2021-08-24 DIAGNOSIS — M170 Bilateral primary osteoarthritis of knee: Secondary | ICD-10-CM

## 2021-08-24 MED ORDER — LIDOCAINE HCL 1 % IJ SOLN
1.5000 mL | INTRAMUSCULAR | Status: AC | PRN
  Administered 2021-08-24: 1.5 mL via INTRA_ARTICULAR

## 2021-08-24 MED ORDER — HYLAN G-F 20 16 MG/2ML IX SOSY
16.0000 mg | PREFILLED_SYRINGE | INTRA_ARTICULAR | Status: AC | PRN
  Administered 2021-08-24: 16 mg via INTRA_ARTICULAR

## 2021-08-24 NOTE — Progress Notes (Signed)
? ?  Procedure Note ? ?[name]             ?Date of Birth: 18-Jul-1955           ?MRN: [id number]             ?Visit Date: 08/24/2021 ? ?Procedures: ?Visit Diagnoses:  ?1. Primary osteoarthritis of both knees   ? ? ?Synvisc #1 bilateral knees, B/B ?Large Joint Inj: bilateral knee on 08/24/2021 10:36 AM ?Indications: pain ?Details: 25 G 1.5 in needle, medial approach ? ?Arthrogram: No ? ?Medications (Right): 1.5 mL lidocaine 1 %; 16 mg Hylan 16 MG/2ML ?Aspirate (Right): 0 mL ?Medications (Left): 1.5 mL lidocaine 1 %; 16 mg Hylan 16 MG/2ML ?Aspirate (Left): 0 mL ?Outcome: tolerated well, no immediate complications ?Procedure, treatment alternatives, risks and benefits explained, specific risks discussed. Consent was given by the patient. Immediately prior to procedure a time out was called to verify the correct patient, procedure, equipment, support staff and site/side marked as required. Patient was prepped and draped in the usual sterile fashion.  ? ? ? ?Patient tolerated the procedure well.  Aftercare was discussed.  ?Sherron Ales, PA-C  ?

## 2021-08-26 NOTE — Progress Notes (Deleted)
? ?Office Visit Note ? ?[name]             ?Date of Birth: [birth year]           ?MRN: [id number]             ?PCP: Adrian Prince, MD ?Referring: Adrian Prince, MD ?Visit Date: 09/09/2021 ?Occupation: @GUAROCC @ ? ?Subjective:  ?No chief complaint on file. ? ? ?History of Present Illness: ELAINNA [name] is a 66 y.o. female ***  ? ?Activities of Daily Living:  ?Patient reports morning stiffness for *** {minute/hour:19697}.   ?Patient {ACTIONS;DENIES/REPORTS:21021675::"Denies"} nocturnal pain.  ?Difficulty dressing/grooming: {ACTIONS;DENIES/REPORTS:21021675::"Denies"} ?Difficulty climbing stairs: {ACTIONS;DENIES/REPORTS:21021675::"Denies"} ?Difficulty getting out of chair: {ACTIONS;DENIES/REPORTS:21021675::"Denies"} ?Difficulty using hands for taps, buttons, cutlery, and/or writing: {ACTIONS;DENIES/REPORTS:21021675::"Denies"} ? ?No Rheumatology ROS completed.  ? ?PMFS History:  ?Patient Active Problem List  ? Diagnosis Date Noted  ? Scoliosis 07/24/2020  ? Spondylosis without myelopathy or radiculopathy, lumbar region 07/24/2020  ? Chronic bilateral low back pain without sciatica 07/24/2020  ? Posterior vitreous detachment of right eye 03/18/2020  ? 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 Stable treated proliferative diabetic retinopathy of right eye determined by examination associated with type 2 diabetes mellitus (HCC) 03/18/2020  ? History of vitrectomy 03/18/2020  ? Complicated UTI (urinary tract infection) 12/13/2018  ? Failure of outpatient treatment 12/11/2018  ? Acute metabolic encephalopathy 12/11/2018  ? Fever 12/10/2018  ? Breast cancer screening, high risk patient 05/09/2018  ? Eosinophilia 01/31/2017  ? Iritis 01/04/2017  ? History of joint swelling 08/24/2016  ? Osteoarthritis of knees, bilateral 08/24/2016  ? History of migraine 08/24/2016  ? History of  depression 08/24/2016  ? History of osteoporosis 08/24/2016  ? DDD (degenerative disc disease), lumbar 08/24/2016  ? High risk medication use 08/24/2016  ? Trigger finger, left middle finger 08/24/2016  ? Trigger finger, right ring finger 08/24/2016  ? 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 Increased risk of breast cancer 07/06/2015  ? IDDM (insulin dependent diabetes mellitus) 07/06/2015  ? 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 without organ or systems involvement (HCC) 07/06/2015  ? Sjogren's syndrome (HCC) 07/06/2015  ? Genetic testing 05/12/2015  ? Family history of breast cancer 04/15/2015  ? Fibrocystic breast disease 10/11/2012  ? GERD 10/01/2009  ? Chest pain with moderate risk for cardiac etiology 09/02/2009  ? DYSPHAGIA 09/02/2009  ? ANXIETY 08/15/2009  ? ULCER-GASTRIC 08/15/2009  ? CONSTIPATION 08/15/2009  ? SLOW TRANSIT CONSTIPATION 08/15/2009  ? FLATULENCE ERUCTATION AND GAS PAIN 08/15/2009  ?  ?Past Medical History:  ?Diagnosis Date  ? Abnormal ECG   ? Anemia   ? Anxiety   ? Cataract   ? Marland Kitchen Cateracts bil eyes removed  ? GERD (gastroesophageal reflux disease)   ? Heart murmur   ? History of COVID-19 06/14/2020  ? Hypercholesterolemia   ? IDDM (insulin dependent diabetes mellitus) 1969  ? Migraine headache   ? Neuromuscular disorder (HCC)   ? bil legs & feet - neuropathy  ? Neuropathy   ? Osteoporosis   ? 10/2012  ? Otosclerosis   ? RA (rheumatoid arthritis) (HCC) 2000  ? S/P cardiac cath August 2013  ? Normal coronaries and normal LV function.  ? Sjoegren syndrome   ?  ?Family History  ?Problem Relation Age of Onset  ? Breast cancer Mother 57  ? Colon polyps Mother   ?     unspecified  number  ? Uterine cancer Mother   ?     metastatic  ? Breast cancer Maternal Grandmother 59  ? Stroke Father 26  ? Rheum arthritis Sister   ? Breast cancer Sister 68  ?     lump; (-)GT (breast/ova ca panel, GeneDx)  ? Skin cancer Maternal Grandfather   ?     unspecified type  ? Prostate cancer Maternal  Grandfather   ?     dx. late 16s  ? Breast cancer Other   ?     dx. in their late 60s  ? Breast cancer Other   ?     dx. 78-79  ? Breast cancer Cousin   ?     bilateral; dx. late 63s  ? Colon cancer Neg Hx   ? Esophageal cancer Neg Hx   ? Rectal cancer Neg Hx   ? Stomach cancer Neg Hx   ? ?Past Surgical History:  ?Procedure Laterality Date  ? back ablation  03/2020  ? back ablation  10/09/2020  ? BREAST LUMPECTOMY  1995  ? CARPAL TUNNEL RELEASE  2000  ? CATARACT EXTRACTION Right 09-05-12  ? CATARACT EXTRACTION Left 3/15  ? CESAREAN SECTION  1987  ? COLONOSCOPY    ? EYE SURGERY    ? diabetic retinopathy   ? HYSTEROSCOPY  02/2001  ? and D & C  ? LYMPH NODE DISSECTION  2002  ? TOE SURGERY  2000  ? bone removed  ? ?Social History  ? ?Social History Narrative  ? Not on file  ? ?Immunization History  ?Administered Date(s) Administered  ? PFIZER(Purple Top)SARS-COV-2 Vaccination 07/11/2019, 08/01/2019, 02/05/2020, 09/15/2020  ? Tdap 06/21/2013  ?  ? ?Objective: ?Vital Signs: LMP 09/19/2008   ? ?Physical Exam  ? ?Musculoskeletal Exam: *** ? ?CDAI Exam: ?CDAI Score: -- ?Patient Global: --; Provider Global: -- ?Swollen: --; Tender: -- ?Joint Exam 09/09/2021  ? ?No joint exam has been documented for this visit  ? ?There is currently no information documented on the homunculus. Go to the Rheumatology activity and complete the homunculus joint exam. ? ?Investigation: ?No additional findings. ? ?Imaging: ?No results found. ? ?Recent Labs: ?Lab Results  ?Component Value Date  ? WBC 5.2 07/10/2021  ? HGB 14.0 07/10/2021  ? PLT 259 07/10/2021  ? NA 143 07/10/2021  ? K 4.3 07/10/2021  ? CL 107 07/10/2021  ? CO2 31 07/10/2021  ? GLUCOSE 144 (H) 07/10/2021  ? BUN 18 07/10/2021  ? CREATININE 0.99 07/10/2021  ? BILITOT 0.5 07/10/2021  ? ALKPHOS 79 05/13/2020  ? AST 27 07/10/2021  ? ALT 21 07/10/2021  ? PROT 7.1 07/10/2021  ? ALBUMIN 3.9 05/13/2020  ? CALCIUM 9.7 07/10/2021  ? GFRAA 66 12/17/2020  ? QFTBGOLD NEGATIVE 04/06/2017  ?  QFTBGOLDPLUS NEGATIVE 07/10/2021  ? ? ?Speciality Comments: Recieves Enbrel Sureclick through [employer]. ?Prolia start 01/12/21 ? ?Procedures:  ?No procedures performed ?Allergies: Chlorhexidine, Macrobid [nitrofurantoin macrocrystal], Ciprofloxacin, and Penicillins  ? ?Assessment / Plan:     ?Visit Diagnoses: No diagnosis found. ? ?Orders: ?No orders of the defined types were placed in this encounter. ? ?No orders of the defined types were placed in this encounter. ? ? ?Face-to-face time spent with patient was *** minutes. Greater than 50% of time was spent in counseling and coordination of care. ? ?Follow-Up Instructions: No follow-ups on file. ? ? ?Ellen Henri, CMA ? ?Note - This record has been created using [employer].  ?Chart creation errors  have been sought, but may not always  ?have been located. Such creation errors do not reflect on  ?the standard of medical care.  ?

## 2021-08-31 ENCOUNTER — Encounter: Payer: Self-pay | Admitting: Physician Assistant

## 2021-08-31 ENCOUNTER — Other Ambulatory Visit: Payer: Self-pay

## 2021-08-31 ENCOUNTER — Ambulatory Visit: Payer: Medicare Other | Admitting: Physician Assistant

## 2021-08-31 VITALS — BP 151/79 | HR 86 | Ht 64.0 in | Wt 130.6 lb

## 2021-08-31 DIAGNOSIS — Z8639 Personal history of other endocrine, nutritional and metabolic disease: Secondary | ICD-10-CM

## 2021-08-31 DIAGNOSIS — M5030 Other cervical disc degeneration, unspecified cervical region: Secondary | ICD-10-CM

## 2021-08-31 DIAGNOSIS — Z79899 Other long term (current) drug therapy: Secondary | ICD-10-CM

## 2021-08-31 DIAGNOSIS — R21 Rash and other nonspecific skin eruption: Secondary | ICD-10-CM

## 2021-08-31 DIAGNOSIS — H209 Unspecified iridocyclitis: Secondary | ICD-10-CM

## 2021-08-31 DIAGNOSIS — M5136 Other intervertebral disc degeneration, lumbar region: Secondary | ICD-10-CM

## 2021-08-31 DIAGNOSIS — Z8669 Personal history of other diseases of the nervous system and sense organs: Secondary | ICD-10-CM

## 2021-08-31 DIAGNOSIS — M3501 Sicca syndrome with keratoconjunctivitis: Secondary | ICD-10-CM | POA: Diagnosis not present

## 2021-08-31 DIAGNOSIS — M0579 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 without organ or systems involvement: Secondary | ICD-10-CM

## 2021-08-31 DIAGNOSIS — Z8719 Personal history of other diseases of the digestive system: Secondary | ICD-10-CM

## 2021-08-31 DIAGNOSIS — R5383 Other fatigue: Secondary | ICD-10-CM

## 2021-08-31 DIAGNOSIS — M170 Bilateral primary osteoarthritis of knee: Secondary | ICD-10-CM

## 2021-08-31 DIAGNOSIS — M810 Age-related osteoporosis without current pathological fracture: Secondary | ICD-10-CM

## 2021-08-31 DIAGNOSIS — M65312 Trigger thumb, left thumb: Secondary | ICD-10-CM

## 2021-08-31 DIAGNOSIS — Z8659 Personal history of other mental and behavioral disorders: Secondary | ICD-10-CM

## 2021-08-31 MED ORDER — LIDOCAINE HCL 1 % IJ SOLN
1.5000 mL | INTRAMUSCULAR | Status: AC | PRN
  Administered 2021-08-31: 1.5 mL via INTRA_ARTICULAR

## 2021-08-31 MED ORDER — HYLAN G-F 20 16 MG/2ML IX SOSY
16.0000 mg | PREFILLED_SYRINGE | INTRA_ARTICULAR | Status: AC | PRN
  Administered 2021-08-31: 16 mg via INTRA_ARTICULAR

## 2021-08-31 NOTE — Progress Notes (Signed)
Office Visit Note  [name]             Date of Birth: [birth year]           MRN: [id number]             PCP: Adrian Prince, MD Referring: Adrian Prince, MD Visit Date: 08/31/2021 Occupation: @  Subjective:  Rash on legs   History of Present Illness: Becky [name] is a 66 y.o. female with history rheumatoid arthritis, iritis, sjogren's syndrome, and osteoporosis.  She is on enbrel 50 mg sq injections once weekly.  Patient states that she missed her dose of Enbrel last week due to the concern for a rash on her lower extremities.  She states that she initially noticed the rash about 3 weeks ago.  She has not been able to identify a trigger for her symptoms.  She denies any pain or itching with the rash.  She denies starting any new medications recently.  She has not had any recent fevers or infections.  She has not seen her dermatologist yet.  She has tried applying Aquaphor and another over-the-counter moisturizer with no improvement in her symptoms. She denies any signs or symptoms of a rheumatoid arthritis flare.  She is not experiencing any increased joint pain or joint swelling at this time.  She states that her knee joint pain has improved since starting the Visco series last week.  She denies any joint swelling at this time.  She states that she is started to have some increased discomfort in her lower back and plans on calling to schedule a spinal nerve ablation in the future.  She denies any recent falls or fractures.  Her last Prolia injection was administered on 07/27/2021.  She continues to take a calcium and vitamin D supplement daily.    Activities of Daily Living:  Patient reports morning stiffness for 30 minutes.   Patient Reports nocturnal pain.  Difficulty dressing/grooming: Denies Difficulty climbing stairs: Reports Difficulty getting out of chair: Denies Difficulty using hands for taps, buttons, cutlery, and/or writing: Reports  Review of  Systems  Constitutional:  Positive for fatigue.  HENT:  Positive for mouth dryness and nose dryness. Negative for mouth sores.   Eyes:  Positive for dryness. Negative for pain and itching.  Respiratory:  Negative for shortness of breath and difficulty breathing.   Cardiovascular:  Negative for chest pain and palpitations.  Gastrointestinal:  Negative for blood in stool, constipation and diarrhea.  Endocrine: Negative for increased urination.  Genitourinary:  Negative for difficulty urinating.  Musculoskeletal:  Positive for myalgias, morning stiffness, muscle tenderness and myalgias. Negative for joint pain, joint pain and joint swelling.  Skin:  Positive for rash. Negative for color change.  Allergic/Immunologic: Positive for susceptible to infections.  Neurological:  Positive for weakness. Negative for dizziness, numbness, headaches and memory loss.  Hematological:  Positive for bruising/bleeding tendency.  Psychiatric/Behavioral:  Negative for confusion.    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 determined by examination associated with type 1 diabetes mellitus (HCC) 03/18/2020   Stable treated proliferative diabetic retinopathy of right eye determined by examination associated with type 2 diabetes mellitus (HCC) 03/18/2020  History of vitrectomy 03/18/2020   Complicated UTI (urinary tract infection) 12/13/2018   Failure of outpatient treatment 12/11/2018   Acute metabolic encephalopathy 12/11/2018   Fever 12/10/2018   Breast cancer screening, high risk patient 05/09/2018   Eosinophilia 01/31/2017   Iritis 01/04/2017   History of joint swelling 08/24/2016    Osteoarthritis of knees, bilateral 08/24/2016   History of migraine 08/24/2016   History of depression 08/24/2016   History of osteoporosis 08/24/2016   DDD (degenerative disc disease), lumbar 08/24/2016   High risk medication use 08/24/2016   Trigger finger, left middle finger 08/24/2016   Trigger finger, right ring finger 08/24/2016   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   Increased risk of breast cancer 07/06/2015   IDDM (insulin dependent diabetes mellitus) 07/06/2015   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 without organ or systems involvement (HCC) 07/06/2015   Sjogren's syndrome (HCC) 07/06/2015   Genetic testing 05/12/2015   Family history of breast cancer 04/15/2015   Fibrocystic breast disease 10/11/2012   GERD 10/01/2009   Chest pain with moderate risk for cardiac etiology 09/02/2009   DYSPHAGIA 09/02/2009   ANXIETY 08/15/2009   ULCER-GASTRIC 08/15/2009   CONSTIPATION 08/15/2009   SLOW TRANSIT CONSTIPATION 08/15/2009   FLATULENCE ERUCTATION AND GAS PAIN 08/15/2009    Past Medical History:  Diagnosis Date   Abnormal ECG    Anemia    Anxiety    Cataract    . Cateracts bil eyes removed   GERD (gastroesophageal reflux disease)    Heart murmur    History of COVID-19 06/14/2020   Hypercholesterolemia    IDDM (insulin dependent diabetes mellitus) 1969   Migraine headache    Neuromuscular disorder (HCC)    bil legs & feet - neuropathy   Neuropathy    Osteoporosis    10/2012   Otosclerosis    RA (rheumatoid arthritis) (HCC) 2000   S/P cardiac cath August 2013   Normal coronaries and normal LV function.   Sjoegren syndrome     Family History  Problem Relation Age of Onset   Breast cancer Mother 53   Colon polyps Mother        unspecified number   Uterine cancer Mother        metastatic   Breast cancer Maternal Grandmother 7   Stroke Father 90   Rheum arthritis Sister    Breast cancer Sister 48       lump; (-)GT (breast/ova ca panel,  GeneDx)   Skin cancer Maternal Grandfather        unspecified type   Prostate cancer Maternal Grandfather        dx. late 34s   Breast cancer Other        dx. in their late 60s   Breast cancer Other        dx. 78-79   Breast cancer Cousin        bilateral; dx. late 50s   Colon cancer Neg Hx    Esophageal cancer Neg Hx    Rectal cancer Neg Hx    Stomach cancer Neg Hx    Past Surgical History:  Procedure Laterality Date   back ablation  03/2020   back ablation  10/09/2020   BREAST LUMPECTOMY  1995   CARPAL TUNNEL RELEASE  2000   CATARACT EXTRACTION Right 09-05-12   CATARACT EXTRACTION Left 3/15   CESAREAN SECTION  1987   COLONOSCOPY     EYE SURGERY     diabetic retinopathy    HYSTEROSCOPY  02/2001   and D & C   LYMPH NODE DISSECTION  2002   TOE SURGERY  2000   bone removed   Social History   Social History Narrative   Not on file   Immunization History  Administered Date(s) Administered   PFIZER(Purple Top)SARS-COV-2 Vaccination 07/11/2019, 08/01/2019, 02/05/2020, 09/15/2020   Tdap 06/21/2013     Objective: Vital Signs: BP (!) 151/79 (BP Location: Right Arm, Patient Position: Sitting, Cuff Size: Normal)    Pulse 86    Ht 5\' 4"  (1.626 m)    Wt 130 lb 9.6 oz (59.2 kg)    LMP 09/19/2008    BMI 22.42 kg/m    Physical Exam Vitals and nursing note reviewed.  Constitutional:      Appearance: She is well-developed.  HENT:     Head: Normocephalic and atraumatic.  Eyes:     Conjunctiva/sclera: Conjunctivae normal.  Cardiovascular:     Rate and Rhythm: Normal rate and regular rhythm.     Heart sounds: Normal heart sounds.  Pulmonary:     Effort: Pulmonary effort is normal.     Breath sounds: Normal breath sounds.  Abdominal:     General: Bowel sounds are normal.     Palpations: Abdomen is soft.  Musculoskeletal:     Cervical back: Normal range of motion.  Lymphadenopathy:     Cervical: No cervical adenopathy.  Skin:    General: Skin is warm and dry.      Capillary Refill: Capillary refill takes less than 2 seconds.  Neurological:     Mental Status: She is alert and oriented to person, place, and time.  Psychiatric:        Behavior: Behavior normal.      Musculoskeletal Exam: C-spine has good range of motion with no discomfort.  Painful range of motion of the lumbar spine.  Shoulder joints, elbow joints, wrist joints, MCPs, PIPs, DIPs have good range of motion with no synovitis.  Left trigger thumb noted.  Complete fist formation bilaterally.  Painful range of motion of both hip joints especially the right hip.  Knee joints have good range of motion with no warmth or effusion.  Ankle joints have good range of motion with no tenderness or joint swelling.  CDAI Exam: CDAI Score: 0.2  Patient Global: 1 mm; Provider Global: 1 mm Swollen: 0 ; Tender: 0  Joint Exam 08/31/2021   No joint exam has been documented for this visit   There is currently no information documented on the homunculus. Go to the Rheumatology activity and complete the homunculus joint exam.  Investigation: No additional findings.  Imaging: No results found.  Recent Labs: Lab Results  Component Value Date   WBC 5.2 07/10/2021   HGB 14.0 07/10/2021   PLT 259 07/10/2021   NA 143 07/10/2021   K 4.3 07/10/2021   CL 107 07/10/2021   CO2 31 07/10/2021   GLUCOSE 144 (H) 07/10/2021   BUN 18 07/10/2021   CREATININE 0.99 07/10/2021   BILITOT 0.5 07/10/2021   ALKPHOS 79 05/13/2020   AST 27 07/10/2021   ALT 21 07/10/2021   PROT 7.1 07/10/2021   ALBUMIN 3.9 05/13/2020   CALCIUM 9.7 07/10/2021   GFRAA 66 12/17/2020   QFTBGOLD NEGATIVE 04/06/2017   QFTBGOLDPLUS NEGATIVE 07/10/2021    Speciality Comments: Recieves Enbrel Sureclick through [employer]. Prolia start 01/12/21  Procedures:  No procedures performed Allergies: Chlorhexidine, Macrobid [nitrofurantoin macrocrystal], Ciprofloxacin, and Penicillins  Dr. 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  She has not had any signs or symptoms of a rheumatoid arthritis flare.  She has clinically been doing well on Enbrel 50 mg sq injections once weekly.  She held her dose of Enbrel on Wednesday due to the concern for a rash on both lower extremities.  No signs of infection were noted.  She has not noticed any increased joint pain or inflammation since postponing the dose of Enbrel.  She was advised to restart on Enbrel today.  No medication changes will be made at this time.  She was advised to notify us if she develops increased joint pain or joint swelling.  She will follow-up in the office in 3 months or sooner if needed.   High risk medication use -Enbrel 50 mg sq injections once weekly. Discontinued methotrexate in May 2021.  CBC and CMP drawn on 07/10/2021.  Results were reviewed with the patient today in the office and all questions were addressed.  CBC and CMP will be updated today while the patient is in the office.  Her next lab work will be due in June and every 3 months to monitor for drug toxicity.  TB Gold negative on 07/10/2021 and will continue to be monitored yearly.  Plan: CBC with Differential/Platelet, COMPLETE METABOLIC PANEL WITH GFR She has not had any recent infections.  Discussed the importance of holding Enbrel if she develops signs or symptoms of an infection and to resume once the infection has completely cleared. She has skin examinations every 6 months with her dermatologist.  Iritis: She has not had any signs or symptoms of an iritis flare since her last office visit.  No conjunctival injection was noted.   Sjogren's syndrome with keratoconjunctivitis sicca (HCC) -She has chronic sicca symptoms which are typically worse during the wintertime.  She is using a  humidifier in her home daily and gel lubricating drops at night.  Discussed use of over-the-counter products.  The following lab work will be updated today.  Plan: C3 and C4, ANA, Serum protein electrophoresis with reflex, Rheumatoid factor  Trigger thumb, left thumb: She had an injection on 01/07/21 which alleviated the pain and locking she was experiencing. She has been experiencing intermittent triggering recently but has no tenderness or pain at this time.  Different treatment options were discussed.  DDD (degenerative disc disease), cervical: She has good ROM of the C-spine with no discomfort at this time. No symptoms of radiculopathy.   DDD (degenerative disc disease), lumbar: Chronic pain.  She previously had a spinal ablation which provided pain relief for 9 months.  She plans on calling her spine specialist to schedule a future spinal ablation.  Primary osteoarthritis of both knees - She presents today for the second synvisc injection of the series.  Her knee joint pain has improved significantly since initiating the series.  She tolerated the procedures well today.  Aftercare was discussed.  Procedure notes were completed.   She has good ROM of both knee joints on examination.  No warmth or effusion noted. Plan: Large Joint Inj: bilateral knee  Age-related osteoporosis without current pathological fracture: DEXA ordered by Dr. Evlyn [facility].  Prolia injections x3.  Her most recent injection was administered on 07/27/21.  She remains on calcium and vitamin D supplement daily.  She has not had any  recent falls or fractures.  Rash and other nonspecific skin eruption - She presents today with a rash on bilateral lower legs.  The rash initially started 3 weeks ago with no identifiable trigger.  She has not yet seen her dermatologist for further evaluation.  She has tried applying Aquaphor as well as an over-the-counter moisturizer with no improvement.  She has not had any pain, sensitivity, or itching of the  rash.  The rash has not spread to any other body parts.  She has not had any recent fevers or infections.  She has not started on any new medications recently.   A photograph of the rash on bilateral lower extremities was uploaded to the chart today. Nontender, erythematous papules and petechiae were noted on examination.  No drainage noted.   I called Dr. Corliss Skains to discuss the patient's current symptoms as well as to review images of the rash.  Due to the concern for possible vasculitis the following lab work was obtained today for further evaluation.  The patient was advised to call her dermatologist today to schedule an urgent evaluation.  Discussed that she may require a biopsy to confirm the diagnosis. Plan: CBC with Differential/Platelet, COMPLETE METABOLIC PANEL WITH GFR, Cryoglobulin, Sedimentation rate, ANCA Screen Reflex Titer(QUEST), C3 and C4, ANA, Urinalysis, Routine w reflex microscopic Discussed that I would avoid the use of prednisone and steroids at this time in order to not mask the rash prior to seeing her dermatologist. I will be seeing the patient next week for her third Synvisc injection of the series at which time I will reevaluate the rash.  She was advised to notify us if she develops any new or worsening symptoms.  We will call her with the lab results once completed and she will notify us once she has an appointment with her dermatologist for further evaluation.  Other medical conditions are listed as follows:   Other fatigue  History of migraine  History of gastroesophageal reflux (GERD)  History of diabetes mellitus  History of depression   Orders: Orders Placed This Encounter  Procedures   Large Joint Inj: bilateral knee   CBC with Differential/Platelet   COMPLETE METABOLIC PANEL WITH GFR   Cryoglobulin   Sedimentation rate   ANCA Screen Reflex Titer(QUEST)   C3 and C4   ANA   Urinalysis, Routine w reflex microscopic   Serum protein electrophoresis  with reflex   Rheumatoid factor   No orders of the defined types were placed in this encounter.    Follow-Up Instructions: Return in about 3 months (around 12/01/2021) for Rheumatoid arthritis, Iritis, Osteoporosis.   Gearldine Bienenstock, PA-C  Note - This record has been created using Dragon software.  Chart creation errors have been sought, but may not always  have been located. Such creation errors do not reflect on  the standard of medical care.

## 2021-08-31 NOTE — Progress Notes (Signed)
? ?  Procedure Note ? ?[name]             ?Date of Birth: Jun 12, 1956           ?MRN: [id number]             ?Visit Date: 08/31/2021 ? ?Procedures: ?Visit Diagnoses:  ?1. Primary osteoarthritis of both knees   ? ? ?Synvisc #2 bilateral knees, B/B ?Large Joint Inj: bilateral knee on 08/31/2021 9:47 AM ?Indications: pain ?Details: 25 G 1.5 in needle, medial approach ? ?Arthrogram: No ? ?Medications (Right): 1.5 mL lidocaine 1 %; 16 mg Hylan 16 MG/2ML ?Aspirate (Right): 0 mL ?Medications (Left): 1.5 mL lidocaine 1 %; 16 mg Hylan 16 MG/2ML ?Aspirate (Left): 0 mL ?Outcome: tolerated well, no immediate complications ?Procedure, treatment alternatives, risks and benefits explained, specific risks discussed. Consent was given by the patient. Immediately prior to procedure a time out was called to verify the correct patient, procedure, equipment, support staff and site/side marked as required. Patient was prepped and draped in the usual sterile fashion.  ? ? ?Patient tolerated the procedure well.  Aftercare was discussed.  ?Sherron Ales, PA-C  ? ?

## 2021-08-31 NOTE — Patient Instructions (Signed)
Standing Labs ?We placed an order today for your standing lab work.  ? ?Please have your standing labs drawn in June and every 3 months  ? ?If possible, please have your labs drawn 2 weeks prior to your appointment so that the provider can discuss your results at your appointment. ? ?Please note that you may see your imaging and lab results in MyChart before we have reviewed them. ?We may be awaiting multiple results to interpret others before contacting you. ?Please allow our office up to 72 hours to thoroughly review all of the results before contacting the office for clarification of your results. ? ?We have open lab daily: ?Monday through Thursday from 1:30-4:30 PM and Friday from 1:30-4:00 PM ?at the office of Dr. Shaili Deveshwar, [facility].   ?Please be advised, all patients with office appointments requiring lab work will take precedent over walk-in lab work.  ?If possible, please come for your lab work on Monday and Friday afternoons, as you may experience shorter wait times. ?The office is located at [address], [address], , [place] [address] ?No appointment is necessary.   ?Labs are drawn by [facility]. Please bring your co-pay at the time of your lab draw.  You may receive a bill from [facility] for your lab work. ? ?Please note if you are on Hydroxychloroquine and and an order has been placed for a Hydroxychloroquine level, you will need to have it drawn 4 hours or more after your last dose. ? ?If you wish to have your labs drawn at another location, please call the office 24 hours in advance to send orders. ? ?If you have any questions regarding directions or hours of operation,  ?please call [phone].   ?As a reminder, please drink plenty of water prior to coming for your lab work. Thanks! ? ?

## 2021-09-02 DIAGNOSIS — D485 Neoplasm of uncertain behavior of skin: Secondary | ICD-10-CM | POA: Diagnosis not present

## 2021-09-02 NOTE — Progress Notes (Signed)
Absolute eosinophils are elevated-could be related to allergy. Glucose is elevated-210.  Rest of CMP WNL.   ?ESR WNL which is reassuring.  ?RF remains elevated.  ANA is low positive, nonspecific titer.  ?UA revealed trace glucose.   ?Complements WNL.  ? ?Please clarify if the patient has seen her dermatologist yet.

## 2021-09-04 NOTE — Progress Notes (Signed)
ANCA negative.  SPEP did not reveal any abnormal proteins.

## 2021-09-07 ENCOUNTER — Ambulatory Visit: Payer: Medicare Other | Admitting: Physician Assistant

## 2021-09-07 ENCOUNTER — Other Ambulatory Visit: Payer: Self-pay

## 2021-09-07 DIAGNOSIS — M170 Bilateral primary osteoarthritis of knee: Secondary | ICD-10-CM | POA: Diagnosis not present

## 2021-09-07 LAB — COMPLETE METABOLIC PANEL WITH GFR
AG Ratio: 1.3 (calc) (ref 1.0–2.5)
ALT: 21 U/L (ref 6–29)
AST: 26 U/L (ref 10–35)
Albumin: 3.8 g/dL (ref 3.6–5.1)
Alkaline phosphatase (APISO): 71 U/L (ref 37–153)
BUN: 13 mg/dL (ref 7–25)
CO2: 28 mmol/L (ref 20–32)
Calcium: 9.3 mg/dL (ref 8.6–10.4)
Chloride: 106 mmol/L (ref 98–110)
Creat: 0.85 mg/dL (ref 0.50–1.05)
Globulin: 3 g/dL (calc) (ref 1.9–3.7)
Glucose, Bld: 210 mg/dL — ABNORMAL HIGH (ref 65–99)
Potassium: 4.2 mmol/L (ref 3.5–5.3)
Sodium: 140 mmol/L (ref 135–146)
Total Bilirubin: 0.3 mg/dL (ref 0.2–1.2)
Total Protein: 6.8 g/dL (ref 6.1–8.1)
eGFR: 76 mL/min/{1.73_m2} (ref >=60)

## 2021-09-07 LAB — URINALYSIS, ROUTINE W REFLEX MICROSCOPIC
Bilirubin Urine: NEGATIVE
Hgb urine dipstick: NEGATIVE
Ketones, ur: NEGATIVE
Leukocytes,Ua: NEGATIVE
Nitrite: NEGATIVE
Protein, ur: NEGATIVE
Specific Gravity, Urine: 1.011 (ref 1.001–1.035)
pH: 7.5 (ref 5.0–8.0)

## 2021-09-07 LAB — CBC WITH DIFFERENTIAL/PLATELET
Absolute Monocytes: 396 cells/uL (ref 200–950)
Basophils Absolute: 72 cells/uL (ref 0–200)
Basophils Relative: 1 %
Eosinophils Absolute: 806 cells/uL — ABNORMAL HIGH (ref 15–500)
Eosinophils Relative: 11.2 %
HCT: 41.8 % (ref 35.0–45.0)
Hemoglobin: 13.8 g/dL (ref 11.7–15.5)
Lymphs Abs: 1966 cells/uL (ref 850–3900)
MCH: 30.1 pg (ref 27.0–33.0)
MCHC: 33 g/dL (ref 32.0–36.0)
MCV: 91.3 fL (ref 80.0–100.0)
MPV: 12.9 fL — ABNORMAL HIGH (ref 7.5–12.5)
Monocytes Relative: 5.5 %
Neutro Abs: 3960 cells/uL (ref 1500–7800)
Neutrophils Relative %: 55 %
Platelets: 292 10*3/uL (ref 140–400)
RBC: 4.58 10*6/uL (ref 3.80–5.10)
RDW: 12.9 % (ref 11.0–15.0)
Total Lymphocyte: 27.3 %
WBC: 7.2 10*3/uL (ref 3.8–10.8)

## 2021-09-07 LAB — PROTEIN ELECTROPHORESIS, SERUM, WITH REFLEX
Albumin ELP: 3.8 g/dL (ref 3.8–4.8)
Alpha 1: 0.3 g/dL (ref 0.2–0.3)
Alpha 2: 0.8 g/dL (ref 0.5–0.9)
Beta 2: 0.4 g/dL (ref 0.2–0.5)
Beta Globulin: 0.4 g/dL (ref 0.4–0.6)
Gamma Globulin: 1 g/dL (ref 0.8–1.7)
Total Protein: 6.7 g/dL (ref 6.1–8.1)

## 2021-09-07 LAB — SEDIMENTATION RATE: Sed Rate: 11 mm/h (ref 0–30)

## 2021-09-07 LAB — C3 AND C4
C3 Complement: 95 mg/dL (ref 83–193)
C4 Complement: 23 mg/dL (ref 15–57)

## 2021-09-07 LAB — ANA: Anti Nuclear Antibody (ANA): POSITIVE — AB

## 2021-09-07 LAB — ANCA SCREEN W REFLEX TITER: ANCA SCREEN: NEGATIVE

## 2021-09-07 LAB — ANTI-NUCLEAR AB-TITER (ANA TITER): ANA Titer 1: 1:40 {titer} — ABNORMAL HIGH

## 2021-09-07 LAB — RHEUMATOID FACTOR: Rheumatoid fact SerPl-aCnc: 215 IU/mL — ABNORMAL HIGH (ref <14)

## 2021-09-07 LAB — CRYOGLOBULIN: Cryoglobulin, Qualitative Analysis: NOT DETECTED

## 2021-09-07 MED ORDER — HYLAN G-F 20 16 MG/2ML IX SOSY
16.0000 mg | PREFILLED_SYRINGE | INTRA_ARTICULAR | Status: AC | PRN
  Administered 2021-09-07: 16 mg via INTRA_ARTICULAR

## 2021-09-07 MED ORDER — LIDOCAINE HCL 1 % IJ SOLN
1.5000 mL | INTRAMUSCULAR | Status: AC | PRN
  Administered 2021-09-07: 1.5 mL via INTRA_ARTICULAR

## 2021-09-07 NOTE — Progress Notes (Signed)
? ?  Procedure Note ? ?[name]             ?Date of Birth: 1956-04-17           ?MRN: [id number]             ?Visit Date: 09/07/2021 ? ?Procedures: ?Visit Diagnoses:  ?1. Primary osteoarthritis of both knees   ? ?Synvisc #3 bilateral knees, B/B ?Large Joint Inj: bilateral knee on 09/07/2021 11:19 AM ?Indications: pain ?Details: 25 G 1.5 in needle, medial approach ? ?Arthrogram: No ? ?Medications (Right): 1.5 mL lidocaine 1 %; 16 mg Hylan 16 MG/2ML ?Aspirate (Right): 0 mL ?Medications (Left): 1.5 mL lidocaine 1 %; 16 mg Hylan 16 MG/2ML ?Aspirate (Left): 0 mL ?Outcome: tolerated well, no immediate complications ?Procedure, treatment alternatives, risks and benefits explained, specific risks discussed. Consent was given by the patient. Immediately prior to procedure a time out was called to verify the correct patient, procedure, equipment, support staff and site/side marked as required. Patient was prepped and draped in the usual sterile fashion.  ? ? ?Patient tolerated the procedure well.  Aftercare was discussed.  ?Sherron Ales, PA-C  ? ?

## 2021-09-08 DIAGNOSIS — G43719 Chronic migraine without aura, intractable, without status migrainosus: Secondary | ICD-10-CM | POA: Diagnosis not present

## 2021-09-08 DIAGNOSIS — M791 Myalgia, unspecified site: Secondary | ICD-10-CM | POA: Diagnosis not present

## 2021-09-08 DIAGNOSIS — M542 Cervicalgia: Secondary | ICD-10-CM | POA: Diagnosis not present

## 2021-09-08 DIAGNOSIS — D692 Other nonthrombocytopenic purpura: Secondary | ICD-10-CM | POA: Diagnosis not present

## 2021-09-08 DIAGNOSIS — G518 Other disorders of facial nerve: Secondary | ICD-10-CM | POA: Diagnosis not present

## 2021-09-08 NOTE — Progress Notes (Signed)
Cryoglobulin not detected.   ? ?Biopsy results are pending.  Patient will notify us of the results.

## 2021-09-09 ENCOUNTER — Ambulatory Visit: Payer: Medicare Other | Admitting: Rheumatology

## 2021-09-09 DIAGNOSIS — Z8669 Personal history of other diseases of the nervous system and sense organs: Secondary | ICD-10-CM

## 2021-09-09 DIAGNOSIS — Z79899 Other long term (current) drug therapy: Secondary | ICD-10-CM

## 2021-09-09 DIAGNOSIS — H209 Unspecified iridocyclitis: Secondary | ICD-10-CM

## 2021-09-09 DIAGNOSIS — Z8719 Personal history of other diseases of the digestive system: Secondary | ICD-10-CM

## 2021-09-09 DIAGNOSIS — Z8639 Personal history of other endocrine, nutritional and metabolic disease: Secondary | ICD-10-CM

## 2021-09-09 DIAGNOSIS — M5136 Other intervertebral disc degeneration, lumbar region: Secondary | ICD-10-CM

## 2021-09-09 DIAGNOSIS — U071 COVID-19: Secondary | ICD-10-CM

## 2021-09-09 DIAGNOSIS — M5030 Other cervical disc degeneration, unspecified cervical region: Secondary | ICD-10-CM

## 2021-09-09 DIAGNOSIS — R5383 Other fatigue: Secondary | ICD-10-CM

## 2021-09-09 DIAGNOSIS — Z8739 Personal history of other diseases of the musculoskeletal system and connective tissue: Secondary | ICD-10-CM

## 2021-09-09 DIAGNOSIS — M0579 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 without organ or systems involvement: Secondary | ICD-10-CM

## 2021-09-09 DIAGNOSIS — Z8659 Personal history of other mental and behavioral disorders: Secondary | ICD-10-CM

## 2021-09-09 DIAGNOSIS — M170 Bilateral primary osteoarthritis of knee: Secondary | ICD-10-CM

## 2021-09-09 DIAGNOSIS — M65312 Trigger thumb, left thumb: Secondary | ICD-10-CM

## 2021-09-09 DIAGNOSIS — M3501 Sicca syndrome with keratoconjunctivitis: Secondary | ICD-10-CM

## 2021-09-14 ENCOUNTER — Telehealth: Payer: Self-pay

## 2021-09-14 NOTE — Telephone Encounter (Signed)
I have reviewed this information with Dr. Corliss Skains.

## 2021-09-14 NOTE — Telephone Encounter (Signed)
Patient advised we have faxed the labs to the dermatologist as requested. Patient advised Ladona Ridgel and Dr. Corliss Skains reviewed the information about the biopsy results.  ?

## 2021-09-14 NOTE — Telephone Encounter (Signed)
Patient called stating she had an appointment with Dr. Renaldo Fiddler, dermatologist and he requested her labwork results from 08/31/21 be faxed to their office.  Fax [phone] ? 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  Patient states she was prescribed cream and to wear compression socks on her legs.  Patient requested a return call.   ?

## 2021-09-15 ENCOUNTER — Ambulatory Visit (INDEPENDENT_AMBULATORY_CARE_PROVIDER_SITE_OTHER): Payer: Medicare Other | Admitting: Ophthalmology

## 2021-09-15 ENCOUNTER — Other Ambulatory Visit: Payer: Self-pay

## 2021-09-15 DIAGNOSIS — H43811 Vitreous degeneration, right eye: Secondary | ICD-10-CM

## 2021-09-15 DIAGNOSIS — E103552 Type 1 diabetes mellitus with stable proliferative diabetic retinopathy, left eye: Secondary | ICD-10-CM | POA: Diagnosis not present

## 2021-09-15 DIAGNOSIS — Z9889 Other specified postprocedural states: Secondary | ICD-10-CM | POA: Diagnosis not present

## 2021-09-15 DIAGNOSIS — H04123 Dry eye syndrome of bilateral lacrimal glands: Secondary | ICD-10-CM

## 2021-09-15 DIAGNOSIS — H43812 Vitreous degeneration, left eye: Secondary | ICD-10-CM | POA: Diagnosis not present

## 2021-09-15 DIAGNOSIS — E103512 Type 1 diabetes mellitus with proliferative diabetic retinopathy with macular edema, left eye: Secondary | ICD-10-CM

## 2021-09-15 DIAGNOSIS — E103551 Type 1 diabetes mellitus with stable proliferative diabetic retinopathy, right eye: Secondary | ICD-10-CM

## 2021-09-15 DIAGNOSIS — E113551 Type 2 diabetes mellitus with stable proliferative diabetic retinopathy, right eye: Secondary | ICD-10-CM

## 2021-09-15 NOTE — Assessment & Plan Note (Signed)

## 2021-09-15 NOTE — Progress Notes (Signed)
? ? ?09/15/2021 ? ?  ? ?CHIEF COMPLAINT ?Patient presents for  ?Chief Complaint  ?Patient presents with  ? Retina Follow Up  ? ? ? ? ?HISTORY OF PRESENT ILLNESS: ?Becky [name] is a 66 y.o. female who presents to the clinic today for:  ? ?HPI   ? ? Retina Follow Up   ? ?      ? Diagnosis: Diabetic Retinopathy  ? Laterality: right eye  ? Severity: moderate  ? Course: stable  ? ?  ?  ? ? Comments   ?9 mos fu ou oct. ?Pt stated no change in vision, it is stable. ?Pt states burry vision occurs when one or the other eye is closed. ?Pt denied FOL but still sees one floaters here and there. 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  ? ? ? ?  ?  ?Last edited by Angeline Slim on 09/15/2021  1:05 PM.  ?  ? ? ?Referring physician: ?Adrian Prince, MD ?[address] ?[place],  [place] [address] ? ?HISTORICAL INFORMATION:  ? ?Selected notes from the MEDICAL RECORD NUMBER ?  ?   ? ?CURRENT MEDICATIONS: ?No current outpatient medications on file. (Ophthalmic Drugs)  ? ?No current facility-administered medications for this visit. (Ophthalmic Drugs)  ? ?Current Outpatient Medications (Other)  ?Medication Sig  ? aspirin 81 MG tablet Take 81 mg by mouth daily.  ? calcium carbonate (OS-CAL) 600 MG TABS Take 600 mg by mouth 2 (two) times daily with a meal.   ? Continuous Blood Gluc Sensor (FREESTYLE LIBRE 2 SENSOR) MISC CHANGE EVERY 14 DAYS  ? CONTOUR NEXT TEST test strip   ? cyclobenzaprine (FLEXERIL) 10 MG tablet Take 10 mg by mouth 3 (three) times daily as needed for muscle spasms. Reported on 07/03/2015  ? denosumab (PROLIA) 60 MG/ML SOSY injection Inject 60 mg into the skin every 6 (six) months.  ? DULoxetine (CYMBALTA) 30 MG capsule Take 90 mg by mouth daily.  ? etanercept (ENBREL SURECLICK) 50 MG/ML injection Inject one pen into skin weekly  ? famotidine (PEPCID) 20 MG tablet Take 20 mg by mouth 2 (two) times daily.  ? furosemide (LASIX) 20 MG tablet Take 10 mg by mouth daily.   ? insulin aspart (NOVOLOG) 100 UNIT/ML injection  Insulin pump  ? losartan (COZAAR) 50 MG tablet Take 50 mg by mouth daily.  ? Multiple Vitamin (MULTIVITAMIN) tablet Take 1 tablet by mouth daily. (Patient not taking: Reported on 08/31/2021)  ? polyethylene glycol (MIRALAX / GLYCOLAX) 17 g packet Take 17 g by mouth 2 (two) times daily.   ? pravastatin (PRAVACHOL) 20 MG tablet Take 20 mg by mouth daily.   ? SUMAtriptan (IMITREX) 100 MG tablet Take 100 mg by mouth every 2 (two) hours as needed.  ? topiramate (TOPAMAX) 200 MG tablet Take 200 mg by mouth daily.   ? vitamin B-12 (CYANOCOBALAMIN) 1000 MCG tablet Take 1,000 mcg by mouth daily.  ? ?No current facility-administered medications for this visit. (Other)  ? ? ? ? ?REVIEW OF SYSTEMS: ?ROS   ?Negative for: Constitutional, Gastrointestinal, Neurological, Skin, Genitourinary, Musculoskeletal, HENT, Endocrine, Cardiovascular, Eyes, Respiratory, Psychiatric, Allergic/Imm, Heme/Lymph ?Last edited by Angeline Slim on 09/15/2021  1:10 PM.  ?  ? ? ? ?ALLERGIES ?Allergies  ?Allergen Reactions  ? Chlorhexidine Rash  ?  Chloroprep  ? Macrobid [Nitrofurantoin Macrocrystal] Hives  ? Ciprofloxacin Rash  ? Penicillins Rash  ? ? ?PAST MEDICAL HISTORY ?Past Medical History:  ?Diagnosis Date  ? Abnormal ECG   ?  Anemia   ? Anxiety   ? Cataract   ? Marland Kitchen Cateracts bil eyes removed  ? GERD (gastroesophageal reflux disease)   ? Heart murmur   ? History of COVID-19 06/14/2020  ? Hypercholesterolemia   ? IDDM (insulin dependent diabetes mellitus) 1969  ? Migraine headache   ? Neuromuscular disorder (HCC)   ? bil legs & feet - neuropathy  ? Neuropathy   ? Osteoporosis   ? 10/2012  ? Otosclerosis   ? RA (rheumatoid arthritis) (HCC) 2000  ? S/P cardiac cath August 2013  ? Normal coronaries and normal LV function.  ? Sjoegren syndrome   ? ?Past Surgical History:  ?Procedure Laterality Date  ? back ablation  03/2020  ? back ablation  10/09/2020  ? BREAST LUMPECTOMY  1995  ? CARPAL TUNNEL RELEASE  2000  ? CATARACT EXTRACTION Right 09-05-12  ? CATARACT  EXTRACTION Left 3/15  ? CESAREAN SECTION  1987  ? COLONOSCOPY    ? EYE SURGERY    ? diabetic retinopathy   ? HYSTEROSCOPY  02/2001  ? and D & C  ? LYMPH NODE DISSECTION  2002  ? TOE SURGERY  2000  ? bone removed  ? ? ?FAMILY HISTORY ?Family History  ?Problem Relation Age of Onset  ? Breast cancer Mother 58  ? Colon polyps Mother   ?     unspecified number  ? Uterine cancer Mother   ?     metastatic  ? Breast cancer Maternal Grandmother 20  ? Stroke Father 11  ? Rheum arthritis Sister   ? Breast cancer Sister 23  ?     lump; (-)GT (breast/ova ca panel, GeneDx)  ? Skin cancer Maternal Grandfather   ?     unspecified type  ? Prostate cancer Maternal Grandfather   ?     dx. late 52s  ? Breast cancer Other   ?     dx. in their late 60s  ? Breast cancer Other   ?     dx. 78-79  ? Breast cancer Cousin   ?     bilateral; dx. late 42s  ? Colon cancer Neg Hx   ? Esophageal cancer Neg Hx   ? Rectal cancer Neg Hx   ? Stomach cancer Neg Hx   ? ? ?SOCIAL HISTORY ?Social History  ? ?Tobacco Use  ? Smoking status: Never  ? Smokeless tobacco: Never  ?Vaping Use  ? Vaping Use: Never used  ?Substance Use Topics  ? Alcohol use: Yes  ?  Alcohol/week: 2.0 standard drinks  ?  Types: 1 Shots of liquor, 1 Standard drinks or equivalent per week  ?  Comment: maybe 1-2 cocktails per wk  ? Drug use: No  ? ?  ? ?  ? ?OPHTHALMIC EXAM: ? ?Base Eye Exam   ? ? Visual Acuity (ETDRS)   ? ?   Right Left  ? Dist [place] 20/30 -2 20/30  ? Dist ph [place] 20/25 20/25 -1  ? ?  ?  ? ? Tonometry (Tonopen, 1:13 PM)   ? ?   Right Left  ? Pressure 10 11  ? ?  ?  ? ? Pupils   ? ?   Pupils APD  ? Right PERRL None  ? Left PERRL None  ? ?  ?  ? ? Visual Fields   ? ?   Left Right  ?  Full Full  ? ?  ?  ? ? Extraocular Movement   ? ?  Right Left  ?  Full Full  ? ?  ?  ? ? Neuro/Psych   ? ? Oriented x3: Yes  ? Mood/Affect: Normal  ? ?  ?  ? ? Dilation   ? ? Both eyes: 1.0% Mydriacyl, 2.5% Phenylephrine @ 1:13 PM  ? ?  ?  ? ?  ? ?Slit Lamp and Fundus Exam   ? ? External Exam    ? ?   Right Left  ? External Normal Normal  ? ?  ?  ? ? Slit Lamp Exam   ? ?   Right Left  ? Lids/Lashes Normal Normal  ? Conjunctiva/Sclera White and quiet White and quiet  ? Cornea Clear Clear  ? Anterior Chamber Deep and quiet Deep and quiet  ? Iris Round and reactive Round and reactive  ? Lens Posterior chamber intraocular lens Posterior chamber intraocular lens  ? Anterior Vitreous Normal Normal  ? ?  ?  ? ? Fundus Exam   ? ?   Right Left  ? Posterior Vitreous Clear media,, Posterior vitreous detachment Clear media,, Posterior vitreous detachment  ? Disc Normal Normal  ? C/D Ratio 0.45 0.55  ? Macula Microaneurysms, no clinically significant macular edema Microaneurysms, no clinically significant macular edema  ? Vessels PDR-quiet PDR-quiet  ? Periphery Good PRP 360, a attached, clear media Good PRP 360,  attached, clear media  ? ?  ?  ? ?  ? ? ?IMAGING AND PROCEDURES  ?Imaging and Procedures for 09/15/21 ? ?OCT, Retina - OU - Both Eyes   ? ?   ?Right Eye ?Quality was good. Scan locations included subfoveal. Central Foveal Thickness: 300. Progression has been stable. Findings include normal foveal contour.  ? ?Left Eye ?Quality was good. Scan locations included subfoveal. Central Foveal Thickness: 313. Progression has been stable. Findings include normal foveal contour.  ? ?Notes ?Bilateral vitreous detachment with normal foveal contour ? ?  ? ? ?  ?  ? ?  ?ASSESSMENT/PLAN: ? 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  At that time of vitrectomy for the hyalosis, iatrogenic PVD creation seem to distort the peripapillary retina to a degree risking retinal break posteriorly thus, PVD creation was avoided ? ?Posterior vitreous detachment of left eye ?This is a remnant prior cortical hyaloid which remained attached upon site improving vitrectomy carried out for very dense  asteroid hyalosis.  At that time of vitrectomy for the hyalosis, iatrogenic PVD creation seem to distort the peripapillary retina to a degree risking retinal break posteriorly thus, PVD creation was avoided ?

## 2021-09-15 NOTE — Assessment & Plan Note (Signed)
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  At that time of vitrectomy for the hyalosis, iatrogenic PVD creation seem to distort the peripapillary retina to a degree risking retinal break posteriorly thus, PVD creation was avoided ?

## 2021-09-15 NOTE — Assessment & Plan Note (Signed)
Doing well, symptomatically, has seroma drops made specifically for at [facility] under the care of Dr. Valere DrossGiegengack ?

## 2021-09-15 NOTE — Assessment & Plan Note (Signed)
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  Moreover patient was looking through a screen mesh. ? ?At the time of surgery, iatrogenic attempt to create PVD was not safe as the retina tented in substantial fashion peripapillary nasal to the nerve such that a risk of a retinal tear posteriorly was avoided by leaving the cortical hyaloid intentionally in place. ? ?Subsequently ? ?2022, PVD developed without symptoms ?

## 2021-09-15 NOTE — Assessment & Plan Note (Signed)
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  At that time of vitrectomy for the hyalosis, iatrogenic PVD creation seem to distort the peripapillary retina to a degree risking retinal break posteriorly thus, PVD creation was avoided ?

## 2021-09-30 DIAGNOSIS — N1831 Chronic kidney disease, stage 3a: Secondary | ICD-10-CM | POA: Diagnosis not present

## 2021-09-30 DIAGNOSIS — E103493 Type 1 diabetes mellitus with severe nonproliferative diabetic retinopathy without macular edema, bilateral: Secondary | ICD-10-CM | POA: Diagnosis not present

## 2021-09-30 DIAGNOSIS — R300 Dysuria: Secondary | ICD-10-CM | POA: Diagnosis not present

## 2021-09-30 DIAGNOSIS — E785 Hyperlipidemia, unspecified: Secondary | ICD-10-CM | POA: Diagnosis not present

## 2021-09-30 DIAGNOSIS — E1040 Type 1 diabetes mellitus with diabetic neuropathy, unspecified: Secondary | ICD-10-CM | POA: Diagnosis not present

## 2021-10-13 ENCOUNTER — Telehealth: Payer: Self-pay | Admitting: Physical Medicine and Rehabilitation

## 2021-10-13 NOTE — Telephone Encounter (Signed)
Had an ablasion performed by [facility] 9 months ago and the pain has returned. She wants to know if she should schedule another appointment to come back to see [facility]. Please advise.  ?

## 2021-10-19 DIAGNOSIS — E109 Type 1 diabetes mellitus without complications: Secondary | ICD-10-CM | POA: Diagnosis not present

## 2021-10-19 DIAGNOSIS — Z794 Long term (current) use of insulin: Secondary | ICD-10-CM | POA: Diagnosis not present

## 2021-10-20 DIAGNOSIS — G518 Other disorders of facial nerve: Secondary | ICD-10-CM | POA: Diagnosis not present

## 2021-10-20 DIAGNOSIS — G43719 Chronic migraine without aura, intractable, without status migrainosus: Secondary | ICD-10-CM | POA: Diagnosis not present

## 2021-10-20 DIAGNOSIS — M542 Cervicalgia: Secondary | ICD-10-CM | POA: Diagnosis not present

## 2021-10-20 DIAGNOSIS — M791 Myalgia, unspecified site: Secondary | ICD-10-CM | POA: Diagnosis not present

## 2021-10-22 ENCOUNTER — Telehealth: Payer: Self-pay

## 2021-10-22 NOTE — Telephone Encounter (Signed)
Submitted a Prior Authorization request to  [employer]  [place] [employer]  for ENBREL via CoverMyMeds. Will update once we receive a response. ? ? ?ZOX:[id number]  ?

## 2021-10-22 NOTE — Telephone Encounter (Signed)
Received notification from [facility] regarding a prior authorization for ENBREL. Authorization has been APPROVED from 10/22/21 to 10/23/22.   ? ?Authorization # [id number]  ?

## 2021-10-23 ENCOUNTER — Encounter: Payer: Self-pay | Admitting: Physical Medicine and Rehabilitation

## 2021-10-23 ENCOUNTER — Ambulatory Visit: Payer: Medicare Other | Admitting: Physical Medicine and Rehabilitation

## 2021-10-23 DIAGNOSIS — M47816 Spondylosis without myelopathy or radiculopathy, lumbar region: Secondary | ICD-10-CM | POA: Diagnosis not present

## 2021-10-23 DIAGNOSIS — M545 Low back pain, unspecified: Secondary | ICD-10-CM

## 2021-10-23 DIAGNOSIS — G8929 Other chronic pain: Secondary | ICD-10-CM | POA: Diagnosis not present

## 2021-10-23 NOTE — Progress Notes (Signed)
? ?SIDDALEE BARRERAS - 66 y.o. female MRN [id number]  Date of birth: 1955/07/26 ? ?Office Visit Note: ?Visit Date: 10/23/2021 ?PCP: Reynold Bowen, MD ?Referred by: Reynold Bowen, MD ? ?Subjective: ?Chief Complaint  ?Patient presents with  ? Lower Back - Pain  ? ?HPI: PRINCELLA CRAIGE is a 66 y.o. female who comes in today for evaluation of chronic, worsening and severe bilateral lower back pain. Patient reports pain has been ongoing for several years and is exacerbated by laying down. She describes her pain as constant sore, aching and stabbing sensation, currently rates as 7 out of 10. Patient reports some relief of pain with home exercise regimen, use of heating pad, rest and over the counter medications as needed. Patients lumbar MRI from 2021 exhibits multi-level facet arthropathy and disc bulge with endplate osteophytic ridging eccentric to the right, no high grade spinal canal stenosis noted. Patient has history of bilateral L5-S1 radiofrequency ablation performed in our office on 10/09/2021. Patient reports greater than 80% pain relief for approximately 9 months with this procedure. Patient states her pain post ablation was much more manageable and she was able to resume physical activities.  Patient reports her pain has gradually come back over the last several months, she reports the same pain as previously, no new symptoms at this time. No complaints of radiculopathy. Patient states she would like to repeat radiofrequency ablation if warranted. Patient denies focal weakness, numbness and tingling. Patient denies recent trauma or falls.  ? ? ? ?Review of Systems  ?Musculoskeletal:  Positive for back pain.  ?Neurological:  Negative for tingling, sensory change, focal weakness and weakness.  ?All other systems reviewed and are negative. Otherwise per HPI. ? ?Assessment & Plan: ?Visit Diagnoses:  ?  ICD-10-CM   ?1. Chronic bilateral low back pain without sciatica  M54.50 Ambulatory referral to Physical  Medicine Rehab  ? G89.29   ?  ?2. Spondylosis without myelopathy or radiculopathy, lumbar region  M47.816 Ambulatory referral to Physical Medicine Rehab  ?  ?3. Facet arthropathy, lumbar  M47.816   ?  ?   ?Plan: Findings:  ?Chronic, worsening and severe bilateral axial low back pain patient continues to have severe pain despite good conservative therapy such as home exercise regimen, use of heating pad, rest and over-the-counter medications as needed.  Patient's clinical presentation and exam are consistent with facet mediated pain.  She does have significant pain with lumbar extension upon exam today.  Patient did get greater than 80% relief with radiofrequency ablation procedure in April 2022.  We believe the next step is to repeat bilateral L5-S1 radiofrequency ablation under fluoroscopic guidance.  Patient has no questions at this time about radiofrequency ablation procedure.  We will call patient to schedule procedure after insurance approval.  Patient encouraged to remain active and to continue home exercise regimen as tolerated.  No red flag symptoms noted upon exam today.  ? ?Meds & Orders: No orders of the defined types were placed in this encounter. ?  ?Orders Placed This Encounter  ?Procedures  ? Ambulatory referral to Physical Medicine Rehab  ?  ?Follow-up: Return for Bilateral L5-S1 radiofrequency ablation.  ? ?Procedures: ?No procedures performed  ?   ? ?Clinical History: ?MRI LUMBAR SPINE WITHOUT CONTRAST ?  ?TECHNIQUE: ?Multiplanar, multisequence MR imaging of the lumbar spine was ?performed. No intravenous contrast was administered. ?  ?COMPARISON:  None. ?  ?FINDINGS: ?Segmentation:  Standard. ?  ?Alignment:  No significant anteroposterior listhesis. ?  ?Vertebrae: Vertebral body heights  are maintained apart from ?degenerative endplate irregularity primarily at L1-L2. There are ?chronic appearing degenerative endplate marrow changes at this ?level. ?  ?Conus medullaris and cauda equina: Conus  extends to the L1-L2 level. ?Conus and cauda equina appear normal. ?  ?Paraspinal and other soft tissues: Unremarkable. ?  ?Disc levels: ?  ?L1-L2: Disc bulge with endplate osteophytic ridging eccentric to the ?right. No canal stenosis. Mild narrowing of the right lateral ?recess. Moderate right foraminal stenosis. No left foraminal ?stenosis. ?  ?L2-L3:  Minimal disc bulge.  No canal or foraminal stenosis. ?  ?L3-L4: Minimal disc bulge. Mild facet arthropathy with ligamentum ?flavum infolding. No canal or foraminal stenosis. ?  ?L4-L5: Minimal disc bulge. Mild facet arthropathy with ligamentum ?flavum infolding. No canal or foraminal stenosis. ?  ?L5-S1: Disc bulge with endplate osteophytic ridging eccentric to the ?left. Mild facet arthropathy. No significant canal stenosis. Minor ?right and mild left foraminal stenosis. ?  ?IMPRESSION: ?Overall mild degenerative changes as detailed above, greatest at ?L1-L2. ?  ?  ?Electronically Signed ?  By: [name] Mis M.D. ?  On: 01/07/2020 09:50  ? ?She reports that she has never smoked. She has never used smokeless tobacco. No results for input(s): HGBA1C, LABURIC in the last 8760 hours. ? ?Objective:  VS:  HT:    WT:   BMI:     BP:   HR: bpm  TEMP: ( )  RESP:  ?Physical Exam ?Vitals and nursing note reviewed.  ?HENT:  ?   Head: Normocephalic and atraumatic.  ?   Right Ear: External ear normal.  ?   Left Ear: External ear normal.  ?   Nose: Nose normal.  ?   Mouth/Throat:  ?   Mouth: Mucous membranes are moist.  ?Eyes:  ?   Extraocular Movements: Extraocular movements intact.  ?Cardiovascular:  ?   Rate and Rhythm: Normal rate.  ?   Pulses: Normal pulses.  ?Pulmonary:  ?   Effort: Pulmonary effort is normal.  ?Abdominal:  ?   General: Abdomen is flat. There is no distension.  ?Musculoskeletal:     ?   General: Tenderness present.  ?   Cervical back: Normal range of motion.  ?   Comments: Pt is slow to rise from seated position to standing. Concordant low back pain  with facet loading, lumbar spine extension and rotation. Strong distal strength without clonus, no pain upon palpation of greater trochanters. Sensation intact bilaterally. Walks independently, gait steady.   ?Skin: ?   General: Skin is warm and dry.  ?   Capillary Refill: Capillary refill takes less than 2 seconds.  ?Neurological:  ?   General: No focal deficit present.  ?   Mental Status: She is alert and oriented to person, place, and time.  ?Psychiatric:     ?   Mood and Affect: Mood normal.     ?   Behavior: Behavior normal.  ?  ?Ortho Exam ? ?Imaging: ?No results found. ? ?Past Medical/Family/Surgical/Social History: ?Medications & Allergies reviewed per EMR, new medications updated. ?Patient Active Problem List  ? Diagnosis Date Noted  ? Posterior vitreous detachment of left eye 09/15/2021  ? Dry eye syndrome, bilateral 09/15/2021  ? Scoliosis 07/24/2020  ? Spondylosis without myelopathy or radiculopathy, lumbar region 07/24/2020  ? Chronic bilateral low back pain without sciatica 07/24/2020  ? Posterior vitreous detachment of right eye 03/18/2020  ? 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 determined by examination associated with  type 1 diabetes mellitus ([place]) 03/18/2020  ? Stable treated proliferative diabetic retinopathy of right eye determined by examination associated with type 2 diabetes mellitus ([place]) 03/18/2020  ? History of vitrectomy 03/18/2020  ? Complicated UTI (urinary tract infection) 12/13/2018  ? Failure of outpatient treatment 12/11/2018  ? Acute metabolic encephalopathy [id number]  ? Fever 12/10/2018  ? Breast cancer screening, high risk patient 05/09/2018  ? Eosinophilia 01/31/2017  ? Iritis 01/04/2017  ? History of joint swelling 08/24/2016  ? Osteoarthritis of knees, bilateral 08/24/2016  ? History of migraine 08/24/2016  ? History of depression 08/24/2016  ? History of osteoporosis 08/24/2016  ? DDD (degenerative disc disease), lumbar 08/24/2016   ? High risk medication use 08/24/2016  ? Trigger finger, left middle finger 08/24/2016  ? Trigger finger, right ring finger 08/24/2016  ? 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 Increased risk of breast cancer 07/06/2015

## 2021-11-05 ENCOUNTER — Telehealth: Payer: Self-pay | Admitting: Physical Medicine and Rehabilitation

## 2021-11-05 NOTE — Telephone Encounter (Signed)
Pt called to set an appt for an abrasion on back. Please call pt at  [phone]

## 2021-11-23 ENCOUNTER — Ambulatory Visit: Payer: Self-pay

## 2021-11-23 ENCOUNTER — Encounter: Payer: Self-pay | Admitting: Physical Medicine and Rehabilitation

## 2021-11-23 ENCOUNTER — Ambulatory Visit: Payer: Medicare Other | Admitting: Physical Medicine and Rehabilitation

## 2021-11-23 VITALS — BP 125/71 | HR 85

## 2021-11-23 DIAGNOSIS — M47816 Spondylosis without myelopathy or radiculopathy, lumbar region: Secondary | ICD-10-CM | POA: Diagnosis not present

## 2021-11-23 MED ORDER — METHYLPREDNISOLONE ACETATE 80 MG/ML IJ SUSP
80.0000 mg | Freq: Once | INTRAMUSCULAR | Status: AC
  Administered 2021-11-23: 80 mg

## 2021-11-23 NOTE — Progress Notes (Signed)
Pt state lower back pain. Pt state waling, standing and laying down makes the pain worse. Pt state heating helps with the pain.  Numeric Pain Rating Scale and Functional Assessment Average Pain 6   In the last MONTH (on 0-10 scale) has pain interfered with the following?  1. General activity like being  able to carry out your everyday physical activities such as walking, climbing stairs, carrying groceries, or moving a chair?  Rating(9)   +Driver, -BT, -Dye Allergies.

## 2021-11-23 NOTE — Patient Instructions (Signed)

## 2021-12-08 NOTE — Procedures (Signed)
Lumbar Facet Joint Nerve Denervation  [name]      Date of Birth: [birth year] MRN: [id number] PCP: Adrian Prince, MD      Visit Date: 11/23/2021   Universal Protocol:    Date/Time: 06/20/237:08 PM  Consent Given By: the patient  Position: PRONE  Additional Comments: Vital signs were monitored before and after the procedure. Patient was prepped and draped in the usual sterile fashion. The correct patient, procedure, and site was verified.   Injection Procedure Details:   Procedure diagnoses:  1. Spondylosis without myelopathy or radiculopathy, lumbar region      Meds Administered:  Meds ordered this encounter  Medications   methylPREDNISolone acetate (DEPO-MEDROL) injection 80 mg     Laterality: Bilateral  Location/Site:  L5-S1, L4 medial branch and L5 dorsal ramus  Needle: 18 ga.,  36mm active tip, RF Cannula  Needle Placement: Along juncture of superior articular process and transverse pocess  Findings:  -Comments:  Procedure Details: For each desired target nerve, the corresponding transverse process (sacral ala for the L5 dorsal rami) was identified and the fluoroscope was positioned to square off the endplates of the corresponding vertebral body to achieve a true AP midline view.  The beam was then obliqued 15 to 20 degrees and caudally tilted 15 to 20 degrees to line up a trajectory along the target nerves. The skin over the target of the junction of superior articulating process and transverse process (sacral ala for the L5 dorsal rami) was infiltrated with 2ml of 1% Lidocaine without Epinephrine.  The 18 gauge 68mm active tip outer cannula was advanced in trajectory view to the target.  This procedure was repeated for each target nerve.  Then, for all levels, the outer cannula placement was fine-tuned and the position was then confirmed with bi-planar imaging.    Test stimulation was done both at sensory and motor levels to ensure there  was no radicular stimulation. The target tissues were then infiltrated with 1 ml of 1% Lidocaine without Epinephrine. Subsequently, a percutaneous neurotomy was carried out for 90 seconds at 80 degrees Celsius.  After the completion of the lesion, 1 ml of injectate was delivered. It was then repeated for each facet joint nerve mentioned above. Appropriate radiographs were obtained to verify the probe placement during the neurotomy.   Additional Comments:  The patient tolerated the procedure well Dressing: 2 x 2 sterile gauze and Band-Aid    Post-procedure details: Patient was observed during the procedure. Post-procedure instructions were reviewed.  Patient left the clinic in stable condition.

## 2021-12-08 NOTE — Progress Notes (Signed)
Becky [name] - 66 y.o. female MRN [id number]  Date of birth: 12-11-1955  Office Visit Note: Visit Date: 11/23/2021 PCP: Adrian Prince, MD Referred by: Juanda Chance, NP  Subjective: Chief Complaint  Patient presents with   Lower Back - Pain   HPI:  Becky [name] is a 66 y.o. female who comes in todayfor planned repeat radiofrequency ablation of the Bilateral L5-S1 and L4-L5  Lumbar facet joints. This would be ablation of the corresponding medial branches and/or dorsal rami.  Patient has had double diagnostic blocks with more than 70% relief.  Subsequent ablation gave them more than 6 months of over 60% relief.  They have had chronic back pain for quite some time, more than 3 months, which has been an ongoing situation with recalcitrant axial back pain.  They have no radicular pain.  Their axial pain is worse with standing and ambulating and on exam today with facet loading.  They have had physical therapy as well as home exercise program.  The imaging noted in the chart below indicated facet pathology. Accordingly they meet all the criteria and qualification for for radiofrequency ablation and we are going to complete this today hopefully for more longer term relief as part of comprehensive management program.    ROS Otherwise per HPI.  Assessment & Plan: Visit Diagnoses:    ICD-10-CM   1. Spondylosis without myelopathy or radiculopathy, lumbar region  M47.816 XR C-ARM NO REPORT    Radiofrequency,Lumbar    methylPREDNISolone acetate (DEPO-MEDROL) injection 80 mg      Plan: No additional findings.   Meds & Orders:  Meds ordered this encounter  Medications   methylPREDNISolone acetate (DEPO-MEDROL) injection 80 mg    Orders Placed This Encounter  Procedures   Radiofrequency,Lumbar   XR C-ARM NO REPORT    Follow-up: Return for visit to requesting provider as needed.   Procedures: No procedures performed  Lumbar Facet Joint Nerve Denervation  [name]      Date of Birth: 09/21/1955 MRN: [id number] PCP: Adrian Prince, MD      Visit Date: 11/23/2021   Universal Protocol:    Date/Time: 06/20/237:08 PM  Consent Given By: the patient  Position: PRONE  Additional Comments: Vital signs were monitored before and after the procedure. Patient was prepped and draped in the usual sterile fashion. The correct patient, procedure, and site was verified.   Injection Procedure Details:   Procedure diagnoses:  1. Spondylosis without myelopathy or radiculopathy, lumbar region      Meds Administered:  Meds ordered this encounter  Medications   methylPREDNISolone acetate (DEPO-MEDROL) injection 80 mg     Laterality: Bilateral  Location/Site:  L5-S1, L4 medial branch and L5 dorsal ramus  Needle: 18 ga.,  50mm active tip, RF Cannula  Needle Placement: Along juncture of superior articular process and transverse pocess  Findings:  -Comments:  Procedure Details: For each desired target nerve, the corresponding transverse process (sacral ala for the L5 dorsal rami) was identified and the fluoroscope was positioned to square off the endplates of the corresponding vertebral body to achieve a true AP midline view.  The beam was then obliqued 15 to 20 degrees and caudally tilted 15 to 20 degrees to line up a trajectory along the target nerves. The skin over the target of the junction of superior articulating process and transverse process (sacral ala for the L5 dorsal rami) was infiltrated with 69ml of 1% Lidocaine without Epinephrine.  The 18 gauge  25mm active tip outer cannula was advanced in trajectory view to the target.  This procedure was repeated for each target nerve.  Then, for all levels, the outer cannula placement was fine-tuned and the position was then confirmed with bi-planar imaging.    Test stimulation was done both at sensory and motor levels to ensure there was no radicular stimulation. The target tissues were then  infiltrated with 1 ml of 1% Lidocaine without Epinephrine. Subsequently, a percutaneous neurotomy was carried out for 90 seconds at 80 degrees Celsius.  After the completion of the lesion, 1 ml of injectate was delivered. It was then repeated for each facet joint nerve mentioned above. Appropriate radiographs were obtained to verify the probe placement during the neurotomy.   Additional Comments:  The patient tolerated the procedure well Dressing: 2 x 2 sterile gauze and Band-Aid    Post-procedure details: Patient was observed during the procedure. Post-procedure instructions were reviewed.  Patient left the clinic in stable condition.       Clinical History: MRI LUMBAR SPINE WITHOUT CONTRAST   TECHNIQUE: Multiplanar, multisequence MR imaging of the lumbar spine was performed. No intravenous contrast was administered.   COMPARISON:  None.   FINDINGS: Segmentation:  Standard.   Alignment:  No significant anteroposterior listhesis.   Vertebrae: Vertebral body heights are maintained apart from degenerative endplate irregularity primarily at L1-L2. There are chronic appearing degenerative endplate marrow changes at this level.   Conus medullaris and cauda equina: Conus extends to the L1-L2 level. Conus and cauda equina appear normal.   Paraspinal and other soft tissues: Unremarkable.   Disc levels:   L1-L2: Disc bulge with endplate osteophytic ridging eccentric to the right. No canal stenosis. Mild narrowing of the right lateral recess. Moderate right foraminal stenosis. No left foraminal stenosis.   L2-L3:  Minimal disc bulge.  No canal or foraminal stenosis.   L3-L4: Minimal disc bulge. Mild facet arthropathy with ligamentum flavum infolding. No canal or foraminal stenosis.   L4-L5: Minimal disc bulge. Mild facet arthropathy with ligamentum flavum infolding. No canal or foraminal stenosis.   L5-S1: Disc bulge with endplate osteophytic ridging eccentric to  the left. Mild facet arthropathy. No significant canal stenosis. Minor right and mild left foraminal stenosis.   IMPRESSION: Overall mild degenerative changes as detailed above, greatest at L1-L2.     Electronically Signed   By: Guadlupe Spanish M.D.   On: 01/07/2020 09:50     Objective:  VS:  HT:    WT:   BMI:     BP:125/71  HR:85bpm  TEMP: ( )  RESP:  Physical Exam Vitals and nursing note reviewed.  Constitutional:      General: She is not in acute distress.    Appearance: Normal appearance. She is not ill-appearing.  HENT:     Head: Normocephalic and atraumatic.     Right Ear: External ear normal.     Left Ear: External ear normal.  Eyes:     Extraocular Movements: Extraocular movements intact.  Cardiovascular:     Rate and Rhythm: Normal rate.     Pulses: Normal pulses.  Pulmonary:     Effort: Pulmonary effort is normal. No respiratory distress.  Abdominal:     General: There is no distension.     Palpations: Abdomen is soft.  Musculoskeletal:        General: Tenderness present.     Cervical back: Neck supple.     Right lower leg: No edema.  Left lower leg: No edema.     Comments: Patient has good distal strength with no pain over the greater trochanters.  No clonus or focal weakness. Patient somewhat slow to rise from a seated position to full extension.  There is concordant low back pain with facet loading and lumbar spine extension rotation.  There are no definitive trigger points but the patient is somewhat tender across the lower back and PSIS.  There is no pain with hip rotation.   Skin:    Findings: No erythema, lesion or rash.  Neurological:     General: No focal deficit present.     Mental Status: She is alert and oriented to person, place, and time.     Sensory: No sensory deficit.     Motor: No weakness or abnormal muscle tone.     Coordination: Coordination normal.  Psychiatric:        Mood and Affect: Mood normal.        Behavior: Behavior  normal.      Imaging: No results found.

## 2021-12-14 ENCOUNTER — Telehealth: Payer: Self-pay

## 2021-12-14 NOTE — Telephone Encounter (Signed)
Pt has hx of inj on 11/23/21 RFA and pt state she has the same pain has before. Also is having pain in her neck and would like to be seen for her neck pain. Pt state we meantion she needs an MRI for neck back and she would like one for her neck as well. Please Advise

## 2021-12-17 ENCOUNTER — Ambulatory Visit: Payer: Medicare Other | Admitting: Physical Medicine and Rehabilitation

## 2021-12-17 ENCOUNTER — Encounter: Payer: Self-pay | Admitting: Physical Medicine and Rehabilitation

## 2021-12-17 VITALS — BP 94/62 | HR 87

## 2021-12-17 DIAGNOSIS — M542 Cervicalgia: Secondary | ICD-10-CM

## 2021-12-17 DIAGNOSIS — N1831 Chronic kidney disease, stage 3a: Secondary | ICD-10-CM | POA: Diagnosis not present

## 2021-12-17 DIAGNOSIS — M5412 Radiculopathy, cervical region: Secondary | ICD-10-CM | POA: Diagnosis not present

## 2021-12-17 DIAGNOSIS — G8929 Other chronic pain: Secondary | ICD-10-CM

## 2021-12-17 DIAGNOSIS — M47816 Spondylosis without myelopathy or radiculopathy, lumbar region: Secondary | ICD-10-CM

## 2021-12-17 DIAGNOSIS — I129 Hypertensive chronic kidney disease with stage 1 through stage 4 chronic kidney disease, or unspecified chronic kidney disease: Secondary | ICD-10-CM | POA: Diagnosis not present

## 2021-12-17 DIAGNOSIS — M25511 Pain in right shoulder: Secondary | ICD-10-CM

## 2021-12-17 DIAGNOSIS — E785 Hyperlipidemia, unspecified: Secondary | ICD-10-CM | POA: Diagnosis not present

## 2021-12-17 DIAGNOSIS — M7918 Myalgia, other site: Secondary | ICD-10-CM

## 2021-12-17 DIAGNOSIS — E1040 Type 1 diabetes mellitus with diabetic neuropathy, unspecified: Secondary | ICD-10-CM | POA: Diagnosis not present

## 2021-12-17 NOTE — Progress Notes (Signed)
Becky [name] - 66 y.o. female MRN [id number]  Date of birth: 1956-06-11  Office Visit Note: Visit Date: 12/17/2021 PCP: Reynold Bowen, MD Referred by: Reynold Bowen, MD  Subjective: Chief Complaint  Patient presents with   Neck - Pain   Head - Pain   Right Shoulder - Pain   HPI: Becky [name] is a 66 y.o. female who comes in today for evaluation of chronic, worsening and severe right sided neck pain radiating to right shoulder and up to top of head. Patient reports pain has been ongoing for many years and is exacerbated by movement and activity, most severe pain occurs with side to side rotation. She describes her pain as a sore, aching and tight sensation, currently rates as 10 out of 10. Patient states difficulty sleeping and feels chronic neck issues are causing her to have headaches. She reports some relief of pain with rest and use of over the counter medications. Patient is currently being treated by Dr. Orie Rout at [facility] and does have myofascial trigger point injections performed in his office every 6 weeks. Patient states significant relief of pain with trigger point injections. Patient's 2 view cervical spine x-rays from 2021 exhibits multilevel spondylosis and spondylolisthesis at C5-C6. I was able to locate cervical MRI imaging from [facility] in 2021 that exhibits minimal retrolisthesis of C5 on C6, moderate right foraminal stenosis at C6-C7, and mild canal stenosis at C4-C5. No history of cervical surgery/cervical injections. Patient states her pain is usually very severe and is negatively impacting her daily life and ability to function. Patient denies focal weakness, numbness and tingling. Patient denies recent trauma or falls.   Patient does have history of chronic bilateral lower back pain. Bilateral L5-S1 radiofrequency ablation procedure performed in our office in April of 2022, states this procedure provided  significant and sustained relief for over a year. Bilateral L5-S1 radiofrequency ablation procedure repeated on 11/23/2021 and reports minimal to no relief of pain at this point. Patient's lumbar MRI imaging from 2021 exhibits listhesis at the level of L5-S1, there is also disc bulge with endplate osteophytic ridging eccentric to the left, mild facet arthropathy and minor right and mild left foraminal stenosis at this same level. No high grade spinal canal stenosis noted.      Review of Systems  Musculoskeletal:  Positive for myalgias and neck pain.  Neurological:  Negative for tingling, sensory change, focal weakness and weakness.  All other systems reviewed and are negative.  Otherwise per HPI.  Assessment & Plan: Visit Diagnoses:    ICD-10-CM   1. Radiculopathy, cervical region  M54.12 Ambulatory referral to Physical Therapy    2. Cervicalgia  M54.2 MR CERVICAL SPINE WO CONTRAST    Ambulatory referral to Physical Therapy    3. Chronic right shoulder pain  M25.511 Ambulatory referral to Physical Therapy   G89.29     4. Myofascial pain syndrome  M79.18 Ambulatory referral to Physical Therapy    5. Spondylosis without myelopathy or radiculopathy, lumbar region  M47.816     6. Facet arthropathy, lumbar  M47.816        Plan: Findings:  1.  Chronic, worsening and severe right-sided neck pain radiating to right shoulder and up to top of head.  Patient continues to have severe pain despite good conservative therapy such as rest and use of medications.  Patient's clinical presentation and exam are complex, differentials could include cervical radiculopathy, cervical facet mediated pain, myofascial pain syndrome  or a type of central sensitization syndrome such as fibromyalgia.  We believe the neck step is to obtain new cervical MRI imaging.  I also discussed regrouping with our in-house physical therapy team, I do feel patient would benefit from dry needling treatments as she has multiple  palpable tender trigger points to right trapezius and rhomboid regions.  I did place a referral to our in-house physical therapy team today.  I will have patient follow-up with [place] after her lumbar MRI imaging is obtained for review and to discuss further treatment options. No red flag symptoms noted upon exam today.   2.  Chronic bilateral axial back pain.  Patient is approximately 3 weeks out from repeat bilateral L5-S1 radiofrequency ablation procedure.  No relief of pain at this point. She has a known listhesis at L5-S1 and mild facet arthropathy at this level. Patient instructed to let us know if her pain improves over the next week. If her pain persists she could follow up with Dr. Otelia Sergeant, could benefit from flexion/extension films or re-group with physical therapy to work on core strengthening.     Meds & Orders: No orders of the defined types were placed in this encounter.   Orders Placed This Encounter  Procedures   MR CERVICAL SPINE WO CONTRAST   Ambulatory referral to Physical Therapy    Follow-up: Return for follow up after cervical MRI imaging is obtained.   Procedures: No procedures performed      Clinical History: MRI LUMBAR SPINE WITHOUT CONTRAST   TECHNIQUE: Multiplanar, multisequence MR imaging of the lumbar spine was performed. No intravenous contrast was administered.   COMPARISON:  None.   FINDINGS: Segmentation:  Standard.   Alignment:  No significant anteroposterior listhesis.   Vertebrae: Vertebral body heights are maintained apart from degenerative endplate irregularity primarily at L1-L2. There are chronic appearing degenerative endplate marrow changes at this level.   Conus medullaris and cauda equina: Conus extends to the L1-L2 level. Conus and cauda equina appear normal.   Paraspinal and other soft tissues: Unremarkable.   Disc levels:   L1-L2: Disc bulge with endplate osteophytic ridging eccentric to the right. No canal stenosis. Mild narrowing  of the right lateral recess. Moderate right foraminal stenosis. No left foraminal stenosis.   L2-L3:  Minimal disc bulge.  No canal or foraminal stenosis.   L3-L4: Minimal disc bulge. Mild facet arthropathy with ligamentum flavum infolding. No canal or foraminal stenosis.   L4-L5: Minimal disc bulge. Mild facet arthropathy with ligamentum flavum infolding. No canal or foraminal stenosis.   L5-S1: Disc bulge with endplate osteophytic ridging eccentric to the left. Mild facet arthropathy. No significant canal stenosis. Minor right and mild left foraminal stenosis.   IMPRESSION: Overall mild degenerative changes as detailed above, greatest at L1-L2.     Electronically Signed   By: Guadlupe Spanish M.D.   On: 01/07/2020 09:50 -------------------------------------------- XR Cervical Spine 2 or 3 Views 03/24/2020  Multilevel spondylosis was noted.  Narrowing was noted between C4-5, C5-6,  C6-7 most severe narrowing was noted between C5-C6 with    spondylolisthesis.   Impression: These findings are consistent with multilevel spondylosis and  C5-C6 spondylolisthesis.   She reports that she has never smoked. She has never used smokeless tobacco. No results for input(s): "HGBA1C", "LABURIC" in the last 8760 hours.  Objective:  VS:  HT:    WT:   BMI:     BP:94/62  HR:87bpm  TEMP: ( )  RESP:  Physical Exam Vitals and  nursing note reviewed.  HENT:     Head: Normocephalic and atraumatic.     Right Ear: External ear normal.     Left Ear: External ear normal.     Nose: Nose normal.     Mouth/Throat:     Mouth: Mucous membranes are moist.  Eyes:     Extraocular Movements: Extraocular movements intact.  Cardiovascular:     Rate and Rhythm: Normal rate.     Pulses: Normal pulses.  Pulmonary:     Effort: Pulmonary effort is normal.  Abdominal:     General: Abdomen is flat. There is no distension.  Musculoskeletal:        General: Tenderness present.     Cervical back:  Tenderness present.     Comments: Discomfort noted with side-to-side rotation. Patient has good strength in the upper extremities including 5 out of 5 strength in wrist extension, long finger flexion and APB. There is no atrophy of the hands intrinsically.  Sensation intact bilaterally. Multiple palpable trigger points noted to right trapezius and rhomboid muscles. Negative Hoffman's sign.   Pt is slow to rise from seated position to standing. Concordant low back pain with facet loading, lumbar spine extension and rotation. Strong distal strength without clonus, no pain upon palpation of greater trochanters. Sensation intact bilaterally. Walks independently, gait steady.   Skin:    General: Skin is warm and dry.     Capillary Refill: Capillary refill takes less than 2 seconds.  Neurological:     General: No focal deficit present.     Mental Status: She is alert and oriented to person, place, and time.  Psychiatric:        Mood and Affect: Mood normal.        Behavior: Behavior normal.     Ortho Exam  Imaging: No results found.  Past Medical/Family/Surgical/Social History: Medications & Allergies reviewed per EMR, new medications updated. 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 determined by examination associated with type 1 diabetes mellitus (HCC) 03/18/2020   Stable treated proliferative diabetic retinopathy of right eye determined by examination associated with type 2 diabetes mellitus (HCC) 03/18/2020   History of vitrectomy 03/18/2020   Complicated UTI (urinary tract infection)  12/13/2018   Failure of outpatient treatment 12/11/2018   Acute metabolic encephalopathy 12/11/2018   Fever 12/10/2018   Breast cancer screening, high risk patient 05/09/2018   Eosinophilia 01/31/2017   Iritis 01/04/2017   History of joint swelling 08/24/2016   Osteoarthritis of knees, bilateral 08/24/2016   History of migraine 08/24/2016   History of depression 08/24/2016   History of osteoporosis 08/24/2016   DDD (degenerative disc disease), lumbar 08/24/2016   High risk medication use 08/24/2016   Trigger finger, left middle finger 08/24/2016   Trigger finger, right ring finger 08/24/2016   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   Increased risk of breast cancer 07/06/2015   IDDM (insulin dependent diabetes mellitus) 07/06/2015   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 without organ or systems involvement (HCC) 07/06/2015   Sjogren's syndrome (HCC) 07/06/2015   Genetic testing 05/12/2015   Family history of breast cancer 04/15/2015  Fibrocystic breast disease 10/11/2012   GERD 10/01/2009   Chest pain with moderate risk for cardiac etiology 09/02/2009   DYSPHAGIA 09/02/2009   ANXIETY 08/15/2009   ULCER-GASTRIC 08/15/2009   CONSTIPATION 08/15/2009   SLOW TRANSIT CONSTIPATION 08/15/2009   FLATULENCE ERUCTATION AND GAS PAIN 08/15/2009   Past Medical History:  Diagnosis Date   Abnormal ECG    Anemia    Anxiety    Cataract    . Cateracts bil eyes removed   GERD (gastroesophageal reflux disease)    Heart murmur    History of COVID-19 06/14/2020   Hypercholesterolemia    IDDM (insulin dependent diabetes mellitus) 1969   Migraine headache    Neuromuscular disorder (HCC)    bil legs & feet - neuropathy   Neuropathy    Osteoporosis    10/2012   Otosclerosis    RA (rheumatoid arthritis) ([place]) 2000   S/P cardiac cath August 2013   Normal coronaries and normal LV function.   Sjoegren syndrome    Family History  Problem Relation Age of Onset   Breast  cancer Mother 84   Colon polyps Mother        unspecified number   Uterine cancer Mother        metastatic   Breast cancer Maternal Grandmother 69   Stroke Father 63   Rheum arthritis Sister    Breast cancer Sister 60       lump; (-)GT (breast/ova ca panel, GeneDx)   Skin cancer Maternal Grandfather        unspecified type   Prostate cancer Maternal Grandfather        dx. late 49s   Breast cancer Other        dx. in their late 60s   Breast cancer Other        dx. 78-79   Breast cancer Cousin        bilateral; dx. late 50s   Colon cancer Neg Hx    Esophageal cancer Neg Hx    Rectal cancer Neg Hx    Stomach cancer Neg Hx    Past Surgical History:  Procedure Laterality Date   back ablation  03/2020   back ablation  10/09/2020   BREAST LUMPECTOMY  1995   CARPAL TUNNEL RELEASE  2000   CATARACT EXTRACTION Right 09-05-12   CATARACT EXTRACTION Left 3/15   CESAREAN SECTION  1987   COLONOSCOPY     EYE SURGERY     diabetic retinopathy    HYSTEROSCOPY  02/2001   and D & C   LYMPH NODE DISSECTION  2002   TOE SURGERY  2000   bone removed   Social History   Occupational History   Occupation: disability    Employer: UNEMPLOYED  Tobacco Use   Smoking status: Never   Smokeless tobacco: Never  Vaping Use   Vaping Use: Never used  Substance and Sexual Activity   Alcohol use: Yes    Alcohol/week: 2.0 standard drinks of alcohol    Types: 1 Shots of liquor, 1 Standard drinks or equivalent per week    Comment: maybe 1-2 cocktails per wk   Drug use: No   Sexual activity: Yes    Partners: Male    Birth control/protection: Post-menopausal    Comment: vasectomy

## 2021-12-17 NOTE — Progress Notes (Signed)
Pt state neck pain that travels to right shoulder and up to her head makes the pain worse. Pt state turning her head and laying down makes the pain worse. Pt state the pain cause headaches. Pt state heat, ice and take over the counter pain meds to help ease her pain.  Numeric Pain Rating Scale and Functional Assessment Average Pain 10 Pain Right Now 10 My pain is constant, sharp, burning, stabbing, and aching Pain is worse with: bending, sitting, some activites, and laying down and turning her head Pain improves with: heat/ice and medication   In the last MONTH (on 0-10 scale) has pain interfered with the following?  1. General activity like being  able to carry out your everyday physical activities such as walking, climbing stairs, carrying groceries, or moving a chair?  Rating(5)  2. Relation with others like being able to carry out your usual social activities and roles such as  activities at home, at work and in your community. Rating(6)  3. Enjoyment of life such that you have  been bothered by emotional problems such as feeling anxious, depressed or irritable?  Rating(7)

## 2021-12-17 NOTE — Progress Notes (Signed)
66 y.o. G1P1001 Married Caucasian female here for an office visit.  She has increased risk of breast cancer and is dealing with vaginal atrophy.   Having neck and back issues. Scheduled for MRI in 2 weeks.  Dealing with pigmented purpura.  Has seen dermatology.  No treatments.  She would like a second opinion from a hematologist.   Strong family history of breast cancer.  She has mammogram and breast MRI.  Has some pruritis inside the breasts.  Not taking any medication to reduce risk of breast cancer.  She took tamoxifen for about 6 weeks, and she did not like how she felt.   Some vaginal dryness.  Using coconut oil.   PCP: Adrian Prince, MD   Patient's last menstrual period was 09/19/2008.           Sexually active: Yes.    The current method of family planning is post menopausal status.    Exercising: Yes.     Walking dog Smoker:  no  Health Maintenance: Pap:  12-18-20 Neg, 09-21-17 Neg:Neg HR HPV, 03-18-15 Neg:Neg HR HPV History of abnormal Pap:  yes,   years ago in her 106s -- normal since MMG:  07-16-21 MR Br.Bil/Neg/BiRads1 Colonoscopy:  2017 normal;10 years BMD: 10-16-19  Result : Osteoporosis--Prolia Dr.South and Dr. Corliss Skains. TDaP:  06-21-13 Gardasil:   n/a HIV: 12-10-18 NR Hep C: Neg in the past Screening Labs:  PCP   reports that she has never smoked. She has never used smokeless tobacco. She reports current alcohol use of about 2.0 standard drinks of alcohol per week. She reports that she does not use drugs.  Past Medical History:  Diagnosis Date   Abnormal ECG    Anemia    Anxiety    Cataract    . Cateracts bil eyes removed   GERD (gastroesophageal reflux disease)    Heart murmur    History of COVID-19 06/14/2020   Hypercholesterolemia    IDDM (insulin dependent diabetes mellitus) 1969   Migraine headache    Neuromuscular disorder (HCC)    bil legs & feet - neuropathy   Neuropathy    Osteoporosis    10/2012   Otosclerosis    Pigmented purpura (HCC)     RA (rheumatoid arthritis) (HCC) 2000   S/P cardiac cath 01/2012   Normal coronaries and normal LV function.   Sjoegren syndrome     Past Surgical History:  Procedure Laterality Date   back ablation  03/2020   back ablation  10/09/2020   BREAST LUMPECTOMY  1995   CARPAL TUNNEL RELEASE  2000   CATARACT EXTRACTION Right 09-05-12   CATARACT EXTRACTION Left 3/15   CESAREAN SECTION  1987   COLONOSCOPY     EYE SURGERY     diabetic retinopathy    HYSTEROSCOPY  02/2001   and D & C   LYMPH NODE DISSECTION  2002   TOE SURGERY  2000   bone removed    Current Outpatient Medications  Medication Sig Dispense Refill   aspirin 81 MG tablet Take 81 mg by mouth daily.     calcium carbonate (OS-CAL) 600 MG TABS Take 600 mg by mouth 2 (two) times daily with a meal.      Continuous Blood Gluc Sensor (FREESTYLE LIBRE 2 SENSOR) MISC CHANGE EVERY 14 DAYS     CONTOUR NEXT TEST test strip   6   cyclobenzaprine (FLEXERIL) 10 MG tablet Take 10 mg by mouth 3 (three) times daily as needed for muscle spasms.  Reported on 07/03/2015     denosumab (PROLIA) 60 MG/ML SOSY injection Inject 60 mg into the skin every 6 (six) months.     DULoxetine (CYMBALTA) 30 MG capsule Take 90 mg by mouth daily.     etanercept (ENBREL SURECLICK) 50 MG/ML injection Inject one pen into skin weekly 12 mL 0   famotidine (PEPCID) 20 MG tablet Take 20 mg by mouth 2 (two) times daily.     furosemide (LASIX) 20 MG tablet Take 10 mg by mouth daily.      insulin aspart (NOVOLOG) 100 UNIT/ML injection Insulin pump     losartan (COZAAR) 50 MG tablet Take 50 mg by mouth daily.     polyethylene glycol (MIRALAX / GLYCOLAX) 17 g packet Take 17 g by mouth 2 (two) times daily.      pravastatin (PRAVACHOL) 20 MG tablet Take 20 mg by mouth daily.      SUMAtriptan (IMITREX) 100 MG tablet Take 100 mg by mouth every 2 (two) hours as needed.     topiramate (TOPAMAX) 200 MG tablet Take 200 mg by mouth daily.      triamcinolone cream (KENALOG) 0.1 %  APPLY TO AFFECTED AREA TWICE A DAY     vitamin B-12 (CYANOCOBALAMIN) 1000 MCG tablet Take 1,000 mcg by mouth daily.     No current facility-administered medications for this visit.    Family History  Problem Relation Age of Onset   Breast cancer Mother 67   Colon polyps Mother        unspecified number   Uterine cancer Mother        metastatic   Endometrial cancer Mother 1   Stroke Father 16   Rheum arthritis Sister    Breast cancer Sister 11       lump; (-)GT (breast/ova ca panel, GeneDx)   Breast cancer Maternal Grandmother 51   Skin cancer Maternal Grandfather        unspecified type   Prostate cancer Maternal Grandfather        dx. late 18s   Breast cancer Cousin        bilateral; dx. late 74s   Breast cancer Other        dx. in their late 60s   Breast cancer Other        dx. 78-79   Colon cancer Neg Hx    Esophageal cancer Neg Hx    Rectal cancer Neg Hx    Stomach cancer Neg Hx     Review of Systems  All other systems reviewed and are negative.   Exam:   BP (!) 128/58   Pulse 88   Ht 5' 3.5" (1.613 m)   Wt 125 lb (56.7 kg)   LMP 09/19/2008   SpO2 98%   BMI 21.80 kg/m     General appearance: alert, cooperative and appears stated age Head: normocephalic, without obvious abnormality, atraumatic Neck: no adenopathy, supple, symmetrical, trachea midline and thyroid normal to inspection and palpation Lungs: clear to auscultation bilaterally Breasts: normal appearance, no masses or tenderness, No nipple retraction or dimpling, No nipple discharge or bleeding, No axillary adenopathy Heart: regular rate and rhythm Abdomen: soft, non-tender; no masses, no organomegaly Extremities: extremities normal, atraumatic, no cyanosis or edema Skin: skin color, texture, turgor normal. No rashes or lesions Lymph nodes: cervical, supraclavicular, and axillary nodes normal. Neurologic: grossly normal  Pelvic: External genitalia:  no lesions              No abnormal inguinal  nodes palpated.              Urethra:  normal appearing urethra with no masses, tenderness or lesions              Bartholins and Skenes: normal                 Vagina: normal appearing vagina with normal color and discharge, no lesions.  Atrophy noted.               Cervix: no lesions              Pap taken:  no Bimanual Exam:  Uterus:  normal size, contour, position, consistency, mobility, non-tender              Adnexa: no mass, fullness, tenderness    Chaperone was present for exam:  Marchelle Folks, CMA  Assessment:      Personal hx of fibroglandular breasts. Status post bilateral breast surgeries. Increased lifetime risk of breast cancer.  Intolerant of Tamoxifen due to severe constipation. Category D density of breast tissue.  Negative genetic screening. FH breast cancer.  Mother with breast cancer and metastatic uterine cancer following Tamoxifen. Vaginal atrophy.   Pigmented purpura of the skin.   IDDM. Peripheral neuropathy. Osteoporosis.  On Prolia.  Care through Dr. Evlyn Kanner. Hx chronic constipation.    Plan: Mammogram screening discussed. Self breast awareness reviewed. She will continue with yearly breast MRI. I have referred her to Dr. Pamelia Hoit at the [facility] due to her increased risk of breast cancer.  She will have her dermatologist send records to Dr. Pamelia Hoit about the pigmented purpura.    I recommended vaginal vitamin E for the vaginal atrophy.     Follow up in one year for breast and pelvic exam and pap.  After visit summary provided.   35 min  total time was spent for this patient encounter, including preparation, face-to-face counseling with the patient, coordination of care, and documentation of the encounter.

## 2021-12-23 ENCOUNTER — Telehealth: Payer: Self-pay | Admitting: Obstetrics and Gynecology

## 2021-12-23 ENCOUNTER — Other Ambulatory Visit: Payer: Self-pay | Admitting: *Deleted

## 2021-12-23 ENCOUNTER — Ambulatory Visit (INDEPENDENT_AMBULATORY_CARE_PROVIDER_SITE_OTHER): Payer: Medicare Other | Admitting: Obstetrics and Gynecology

## 2021-12-23 ENCOUNTER — Encounter: Payer: Self-pay | Admitting: Obstetrics and Gynecology

## 2021-12-23 VITALS — BP 128/58 | HR 88 | Ht 63.5 in | Wt 125.0 lb

## 2021-12-23 DIAGNOSIS — M810 Age-related osteoporosis without current pathological fracture: Secondary | ICD-10-CM | POA: Diagnosis not present

## 2021-12-23 DIAGNOSIS — Z79899 Other long term (current) drug therapy: Secondary | ICD-10-CM | POA: Diagnosis not present

## 2021-12-23 DIAGNOSIS — Z9189 Other specified personal risk factors, not elsewhere classified: Secondary | ICD-10-CM

## 2021-12-23 DIAGNOSIS — N952 Postmenopausal atrophic vaginitis: Secondary | ICD-10-CM

## 2021-12-23 NOTE — Telephone Encounter (Signed)
Referral was placed by Dr.Silva, Oncology will call patient to schedule.

## 2021-12-23 NOTE — Telephone Encounter (Signed)
Please assist in making a referral to Dr. Pamelia Hoit, hematology/oncology, for my patient who is at increased risk for breast cancer.   She used to see Dr. Darnelle Catalan for this concern.

## 2021-12-23 NOTE — Patient Instructions (Signed)

## 2021-12-24 ENCOUNTER — Other Ambulatory Visit: Payer: Self-pay | Admitting: Rheumatology

## 2021-12-24 ENCOUNTER — Telehealth: Payer: Self-pay | Admitting: Hematology and Oncology

## 2021-12-24 ENCOUNTER — Other Ambulatory Visit: Payer: Self-pay | Admitting: Pharmacist

## 2021-12-24 ENCOUNTER — Other Ambulatory Visit: Payer: Self-pay | Admitting: Physical Medicine and Rehabilitation

## 2021-12-24 ENCOUNTER — Telehealth: Payer: Self-pay | Admitting: Pharmacist

## 2021-12-24 ENCOUNTER — Telehealth: Payer: Self-pay | Admitting: Physical Medicine and Rehabilitation

## 2021-12-24 DIAGNOSIS — Z79899 Other long term (current) drug therapy: Secondary | ICD-10-CM

## 2021-12-24 DIAGNOSIS — M47816 Spondylosis without myelopathy or radiculopathy, lumbar region: Secondary | ICD-10-CM

## 2021-12-24 DIAGNOSIS — M545 Low back pain, unspecified: Secondary | ICD-10-CM

## 2021-12-24 DIAGNOSIS — M810 Age-related osteoporosis without current pathological fracture: Secondary | ICD-10-CM

## 2021-12-24 LAB — CBC WITH DIFFERENTIAL/PLATELET
Absolute Monocytes: 459 cells/uL (ref 200–950)
Basophils Absolute: 59 cells/uL (ref 0–200)
Basophils Relative: 1.1 %
Eosinophils Absolute: 356 cells/uL (ref 15–500)
Eosinophils Relative: 6.6 %
HCT: 41.4 % (ref 35.0–45.0)
Hemoglobin: 13.7 g/dL (ref 11.7–15.5)
Lymphs Abs: 2003 cells/uL (ref 850–3900)
MCH: 30.2 pg (ref 27.0–33.0)
MCHC: 33.1 g/dL (ref 32.0–36.0)
MCV: 91.2 fL (ref 80.0–100.0)
MPV: 13.1 fL — ABNORMAL HIGH (ref 7.5–12.5)
Monocytes Relative: 8.5 %
Neutro Abs: 2522 cells/uL (ref 1500–7800)
Neutrophils Relative %: 46.7 %
Platelets: 216 10*3/uL (ref 140–400)
RBC: 4.54 10*6/uL (ref 3.80–5.10)
RDW: 13.6 % (ref 11.0–15.0)
Total Lymphocyte: 37.1 %
WBC: 5.4 10*3/uL (ref 3.8–10.8)

## 2021-12-24 LAB — COMPLETE METABOLIC PANEL WITH GFR
AG Ratio: 1.5 (calc) (ref 1.0–2.5)
ALT: 23 U/L (ref 6–29)
AST: 33 U/L (ref 10–35)
Albumin: 4.2 g/dL (ref 3.6–5.1)
Alkaline phosphatase (APISO): 50 U/L (ref 37–153)
BUN: 20 mg/dL (ref 7–25)
CO2: 26 mmol/L (ref 20–32)
Calcium: 9.7 mg/dL (ref 8.6–10.4)
Chloride: 108 mmol/L (ref 98–110)
Creat: 1.02 mg/dL (ref 0.50–1.05)
Globulin: 2.8 g/dL (calc) (ref 1.9–3.7)
Glucose, Bld: 120 mg/dL — ABNORMAL HIGH (ref 65–99)
Potassium: 4.3 mmol/L (ref 3.5–5.3)
Sodium: 142 mmol/L (ref 135–146)
Total Bilirubin: 0.4 mg/dL (ref 0.2–1.2)
Total Protein: 7 g/dL (ref 6.1–8.1)
eGFR: 61 mL/min/{1.73_m2} (ref >=60)

## 2021-12-24 LAB — VITAMIN D 25 HYDROXY (VIT D DEFICIENCY, FRACTURES): Vit D, 25-Hydroxy: 41 ng/mL (ref 30–100)

## 2021-12-24 MED ORDER — ENBREL SURECLICK 50 MG/ML ~~LOC~~ SOAJ
SUBCUTANEOUS | 0 refills | Status: DC
Start: 2021-12-24 — End: 2022-04-08

## 2021-12-24 NOTE — Progress Notes (Signed)
CBC CMP and vitamin D are within normal limits.  Glucose is mildly elevated.

## 2021-12-24 NOTE — Telephone Encounter (Signed)
Pt called requesting a call from [facility]. Pt states she need medical advice from last appt. Please call pt at [phone].

## 2021-12-24 NOTE — Telephone Encounter (Signed)
Encounter reviewed and closed.  

## 2021-12-24 NOTE — Progress Notes (Signed)
Next dose not yet scheduled for Prolia SQ and is due on or after 01/23/22. Diagnosis: age-related osteoporosis  Dose: 60mg  SQ every 6 months  Last Clinic Visit: 04/07/21 Next Clinic Visit: 02/11/22  Last dose: 07/27/21  Labs: 12/23/21 - wnl  Orders placed for Prolia SQ x 1 dose. No premeds required.  ATC patient - left VM and provided with phone number for: [facility] [phone]) ([phone]  Will follow-up to ensured scheduled and completed

## 2021-12-24 NOTE — Telephone Encounter (Signed)
Scheduled appt per 7/5 referral. Pt is aware of appt date and time. Pt is aware to arrive 15 mins prior to appt time and to bring and updated insurance card. Pt is aware of appt location.   

## 2021-12-24 NOTE — Telephone Encounter (Signed)
Patient called requesting prescription refill of Enbrel to be sent to [facility].   °

## 2021-12-24 NOTE — Telephone Encounter (Addendum)
Patient due for Prolia on 01/23/22  Started authorization through [employer] medical benefit via phone. Active from 06/21/21. Plan?follows Medicare guidelines and?covers 80% of the infusion and no authorization is required?for [id number]. Patient has an HMO plan all providers must be in network. [facility] and Dr. Corliss Skains is in Network. Patient does not have Medicare Supplement Plan, so patient will be required to pay the 20% coinsurance per each infusion which will be applied to her Max Out of Pocket- $3,700. Patient has no deductible.  ----- Message from Henriette Combs, LPN sent at 01/19/1885  9:06 AM EDT ----- Patient had labs performed on 12/23/2021. Patient requesting orders to be placed for Prolia. Thanks!

## 2021-12-24 NOTE — Telephone Encounter (Signed)
Next Visit: 02/11/2022  Last Visit: 04/07/2021  Last Fill: 06/04/2021  DX: 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 without organ or systems involvement   Current Dose per office note 04/07/2022: Enbrel SureClick 50 mg every 7 days  Labs: 12/23/2021 CBC CMP and vitamin D are within normal limits.  Glucose is mildly elevated.  TB Gold: 07/10/2021 Neg    Okay to refill Enbrel?

## 2021-12-24 NOTE — Telephone Encounter (Signed)
Patient scheduled on 01/21/22 with Dr.Gudena.

## 2021-12-28 NOTE — Progress Notes (Signed)
Called patient and provided with Medical Day phone to schedule Prolia. Advised that she can call this week but make sure it is not scheduled sooner tan 01/23/22. She verbalized understanding. Will f/u to ensure scheduled and completed  Chesley Mires, PharmD, MPH, BCPS, CPP Clinical Pharmacist (Rheumatology and Pulmonology)

## 2021-12-30 ENCOUNTER — Ambulatory Visit: Payer: Medicare Other | Admitting: Rehabilitative and Restorative Service Providers"

## 2022-01-01 NOTE — Progress Notes (Signed)
Prolia scheduled for 01/25/22. Will f/u to ensure completed  Chesley Mires, PharmD, MPH, BCPS, CPP Clinical Pharmacist (Rheumatology and Pulmonology)

## 2022-01-12 ENCOUNTER — Ambulatory Visit
Admission: RE | Admit: 2022-01-12 | Discharge: 2022-01-12 | Disposition: A | Discharge status: Home / Self Care | Payer: Medicare Other | Source: Ambulatory Visit | Attending: Physical Medicine and Rehabilitation | Admitting: Physical Medicine and Rehabilitation

## 2022-01-12 DIAGNOSIS — M4802 Spinal stenosis, cervical region: Secondary | ICD-10-CM | POA: Diagnosis not present

## 2022-01-12 DIAGNOSIS — M545 Low back pain, unspecified: Secondary | ICD-10-CM | POA: Diagnosis not present

## 2022-01-12 DIAGNOSIS — M542 Cervicalgia: Secondary | ICD-10-CM | POA: Diagnosis not present

## 2022-01-13 ENCOUNTER — Telehealth: Payer: Self-pay | Admitting: Physical Medicine and Rehabilitation

## 2022-01-13 ENCOUNTER — Ambulatory Visit: Payer: Medicare Other | Admitting: Rehabilitative and Restorative Service Providers"

## 2022-01-13 NOTE — Telephone Encounter (Signed)
Called patient to discuss recent cervical and lumbar MRI imaging, no concerning findings noted on recent cervical MRI, I did recommend patient to continue with PT. No significant changes noted on recent lumbar MRI imaging from prior imaging in 2021. I did not recommend any further injections/procedures for lower back pain as recent RFA did not help to alleviate pain.

## 2022-01-15 ENCOUNTER — Telehealth: Payer: Self-pay | Admitting: Physical Medicine and Rehabilitation

## 2022-01-15 NOTE — Therapy (Incomplete)
OUTPATIENT PHYSICAL THERAPY CERVICAL EVALUATION   Patient Name: Becky [name] MRN: [id number] DOB:July 14, 1955, 66 y.o., female Today's Date: 01/19/2022   PT End of Session - 01/19/22 1043     Visit Number 1    Number of Visits 16    Date for PT Re-Evaluation 03/16/22    Authorization Type [employer]    Progress Note Due on Visit 10    PT Start Time 1045    PT Stop Time 1145    PT Time Calculation (min) 60 min             Past Medical History:  Diagnosis Date   Abnormal ECG    Anemia    Anxiety    Cataract    . Cateracts bil eyes removed   GERD (gastroesophageal reflux disease)    Heart murmur    History of COVID-19 06/14/2020   Hypercholesterolemia    IDDM (insulin dependent diabetes mellitus) 1969   Migraine headache    Neuromuscular disorder (HCC)    bil legs & feet - neuropathy   Neuropathy    Osteoporosis    10/2012   Otosclerosis    Pigmented purpura (HCC)    RA (rheumatoid arthritis) (HCC) 2000   S/P cardiac cath 01/2012   Normal coronaries and normal LV function.   Sjoegren syndrome    Past Surgical History:  Procedure Laterality Date   back ablation  03/2020   back ablation  10/09/2020   BREAST LUMPECTOMY  1995   CARPAL TUNNEL RELEASE  2000   CATARACT EXTRACTION Right 09-05-12   CATARACT EXTRACTION Left 3/15   CESAREAN SECTION  1987   COLONOSCOPY     EYE SURGERY     diabetic retinopathy    HYSTEROSCOPY  02/2001   and D & C   LYMPH NODE DISSECTION  2002   TOE SURGERY  2000   bone removed   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  determined by examination associated with type 1 diabetes mellitus (HCC) 03/18/2020   Stable treated proliferative diabetic retinopathy of right eye determined by examination associated with type 2 diabetes mellitus (HCC) 03/18/2020   History of vitrectomy 03/18/2020   Complicated UTI (urinary tract infection) 12/13/2018   Failure of outpatient treatment 12/11/2018   Acute metabolic encephalopathy 12/11/2018   Fever 12/10/2018   Breast cancer screening, high risk patient 05/09/2018   Eosinophilia 01/31/2017   Iritis 01/04/2017   History of joint swelling 08/24/2016   Osteoarthritis of knees, bilateral 08/24/2016   History of migraine 08/24/2016   History of depression 08/24/2016   History of osteoporosis 08/24/2016   DDD (degenerative disc disease), lumbar 08/24/2016   High risk medication use 08/24/2016   Trigger finger, left middle finger 08/24/2016   Trigger finger, right ring finger 08/24/2016   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   Increased risk of breast cancer 07/06/2015   IDDM (insulin dependent diabetes mellitus) 07/06/2015   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 without organ or systems involvement (HCC) 07/06/2015   Sjogren's syndrome (HCC) 07/06/2015   Genetic testing 05/12/2015   Family history of breast cancer 04/15/2015  Fibrocystic breast disease 10/11/2012   GERD 10/01/2009   Chest pain with moderate risk for cardiac etiology 09/02/2009   DYSPHAGIA 09/02/2009   ANXIETY 08/15/2009   ULCER-GASTRIC 08/15/2009   CONSTIPATION 08/15/2009   SLOW TRANSIT CONSTIPATION 08/15/2009   FLATULENCE ERUCTATION AND GAS PAIN 08/15/2009    PCP: Adrian Prince, MD  REFERRING PROVIDER: Juanda Chance, NP  REFERRING DIAG: [phone] (ICD-10-CM) - Radiculopathy, cervical region M25.511,G89.29 (ICD-10-CM) - Chronic right shoulder pain M54.2 (ICD-10-CM) - Cervicalgia M79.18 (ICD-10-CM) - Myofascial pain syndrome  THERAPY DIAG:  Cervicalgia - Plan: PT plan  of care cert/re-cert  Chronic bilateral low back pain with right-sided sciatica - Plan: PT plan of care cert/re-cert  Chronic right shoulder pain - Plan: PT plan of care cert/re-cert  Abnormal posture - Plan: PT plan of care cert/re-cert  Muscle weakness (generalized) - Plan: PT plan of care cert/re-cert  Cramp and spasm - Plan: PT plan of care cert/re-cert  Rationale for Evaluation and Treatment Rehabilitation  ONSET DATE: several years  SUBJECTIVE:                                                                                                                                                                                                         SUBJECTIVE STATEMENT: Patient reports her back pain is as bad or worse than her neck pain.  She had ablation 2 months ago and it did not help.  She had MRI of her back and neck and was upset because she was told that "your MRI looks normal, I don't know why you have back pain." Gets trigger point injections regularly for migraines, which does help neck pain for a little bit.  She reports in the morning wakes up with 14/10 LBP, has to sit on heating pad for hour to move, to bring int down to 8/10 and then walks which improves to 4/10.  The neck is also worse in the mornings, wakes up 10/10 and has a lot of trouble sleeping  "What a way to start the day."  LBP does not limit walking.   PERTINENT HISTORY:  Chronic low back pain with radiculopathy, myofascial trigger point injections every 6 weeks, back ablation, scoliosis, osteoporosis, RA, Type 1 DM, neuropathy, Sjogren's syndrome  PAIN:  Are you having pain? Yes: NPRS scale: 6/10 Pain location: R side neck radiating to R shoulder  Pain description: achy, dull, severe Aggravating factors: sleeping (side sleeper, wakes with 10/10 pain),  Relieving factors: trigger point injections for migraines.   Are you having pain? Yes: NPRS scale: 4-14/10 Pain location:  low back both sides occasionally  radiates down R hip.  Pain description: constant, severe,  Aggravating factors: worse in mornings Relieving factors: improves with movement, heat  PRECAUTIONS: None  WEIGHT BEARING RESTRICTIONS No  FALLS:  Has patient fallen in last 6 months? No  feels unsteady due to severe neuropathy in feet.   LIVING ENVIRONMENT: Lives with: lives with their spouse Lives in: House/apartment Stairs: No Has following equipment at home: None  OCCUPATION: retired,  walks 30 min daily  PLOF: Independent  PATIENT GOALS: improve neck and back pain.   OBJECTIVE:   DIAGNOSTIC FINDINGS:  MRI Cervical Spine 01/12/2022  IMPRESSION: 1. C4-C5 mild spinal canal stenosis and mild right neural foraminal narrowing. 2. C6-C7 moderate right and mild left neural foraminal narrowing. 3. C3-C4 mild right neural foraminal narrowing. MRI Lumbar spine 01/12/2022  IMPRESSION: 1. L1-L2 mild right neural foraminal narrowing. Narrowing of the right lateral recess at this level could affect the descending right L2 nerve roots. 2. Multilevel facet arthropathy, which can be a cause of back pain.   PATIENT SURVEYS:  Modified Oswestry 20/50= 40% moderate disability  NDI 24/50 = 48% moderate disability    COGNITION: Overall cognitive status: Within functional limits for tasks assessed   SENSATION: Severe neuropathy bil feet.   POSTURE: rounded shoulders and forward head - right shoulder forward more than left.   PALPATION: Hypomobility throughout cervical, upper thoracic, and lumbar spine.  Tenderness R UT, levator, suboccipitals and cervical erector spinae. Elevated R 1st rib, forward R shoulder.  Tenderness L2-3, bil QL (R>L), and lumbar multifidi.   CERVICAL ROM:   Active ROM A/PROM (deg) eval  Flexion 40  Extension 50  Right lateral flexion 20  Left lateral flexion 20  Right rotation 55  Left rotation 60   (Blank rows = not tested)  LUMBAR ROM:   Active  A/PROM  eval  Flexion To shins *   Extension 90% limited *  Right lateral flexion To knees   Left lateral flexion To knees inc pain compared to R  Right rotation WNL  Left rotation WNL*   (Blank rows = not tested)  *pain  UPPER EXTREMITY ROM:  Active ROM Right eval Left eval  Shoulder flexion 130* 135  Shoulder extension 55* 65  Shoulder abduction 115* 115  Shoulder adduction    Shoulder extension    Shoulder internal rotation functional low back * functional midback  Shoulder external rotation FER T1* FER t2   (Blank rows = not tested)  *pain  UPPER EXTREMITY MMT:  MMT Right* eval Left eval  Shoulder flexion 4+* 5  Shoulder extension    Shoulder abduction 5 5  Shoulder adduction 5 5  Shoulder extension    Shoulder internal rotation 5 5  Shoulder external rotation 5 5  Elbow flexion 5 5  Elbow extension 5 5  Wrist flexion 5 5  Wrist extension 5 5  Grip strength good good   (Blank rows = not tested) *pain  LOWER EXTREMITY MMT:    MMT Right eval Left eval  Hip flexion 5 5  Hip extension    Hip abduction 5 5  Hip adduction 5 5  Hip internal rotation    Hip external rotation    Knee flexion 5 5  Knee extension 5 5  Ankle dorsiflexion 5 5  Ankle plantarflexion     (Blank rows = not tested)    CERVICAL SPECIAL TESTS:  Spurling's test: Negative   FUNCTIONAL TESTS:  5 times sit  to stand: 11.64 seconds without UE assist.    TODAY'S TREATMENT:  01/19/2022 - Self Care - see patient education.  Therapeutic Exercise: to improve strength and mobility.  Demo, verbal and tactile cues throughout for technique. - Supine Lower Trunk Rotation   10 reps - Supine Posterior Pelvic Tilt  10 reps - Static Prone on Elbows x 1 min - Prone Knee Flexion 10 reps bil   PATIENT EDUCATION:  Education details: education on findings, medical imaging and correlation with pain, plan of care, posture, and initial HEP and log roll.  Person educated: Patient Education method: Explanation, Demonstration, Verbal  cues, and Handouts Education comprehension: verbalized understanding and returned demonstration   HOME EXERCISE PROGRAM: Access Code: [email or web address]   ASSESSMENT:  CLINICAL IMPRESSION: Patient is a 66 y.o. right hand dominant female who was seen today for physical therapy evaluation and treatment for cervical radiculopathy and chronic R shoulder pain.  She also reports chronic low back pain.   She demonstrates hypomobility throughout cervical, thoracic, and lumbar spine, impaired posture, pain with overhead shoulder movements, and tenderness and trigger points throughout lumbar and cervical musculature.  She did not have strength deficits.  She would benefit from skilled physical therapy to decrease neck and back pain, improve vertebral mobility, improve posture and tolerance to activity, and also improve sleep for QOL.     OBJECTIVE IMPAIRMENTS decreased activity tolerance, decreased balance, decreased endurance, decreased ROM, decreased strength, hypomobility, increased fascial restrictions, impaired perceived functional ability, increased muscle spasms, impaired flexibility, impaired UE functional use, improper body mechanics, postural dysfunction, and pain.   ACTIVITY LIMITATIONS carrying, lifting, bending, sleeping, bed mobility, dressing, and reach over head  PARTICIPATION LIMITATIONS: meal prep, cleaning, laundry, driving, shopping, community activity, and yard work  PERSONAL FACTORS 3+ comorbidities: Chronic low back pain, migraines, scoliosis, osteoporosis, RA, IDDM, neuropathy, Sjogren's syndrome  are also affecting patient's functional outcome.   REHAB POTENTIAL: Good  CLINICAL DECISION MAKING: Evolving/moderate complexity  EVALUATION COMPLEXITY: Moderate   GOALS: Goals reviewed with patient? Yes  SHORT TERM GOALS: Target date: 02/09/2022   Patient will be independent with initial HEP.  Baseline: given for back Goal status: INITIAL   LONG TERM GOALS: Target date: 03/16/2022    Patient will be independent with advanced/ongoing HEP to improve outcomes and carryover.  Baseline: needs progression Goal status: INITIAL  2.  Patient will report 75% improvement in neck pain when sleeping/waking to improve QOL.  Baseline: severe pain in mornings.  Goal status: INITIAL  3.  Patient will demonstrate full pain free cervical ROM for safety with driving.  Baseline: see objective Goal status: INITIAL  4.  Patient will report <17/50 on NDI  to demonstrate improved functional ability. Baseline: 24/50 Goal status: INITIAL  5.   Patient will report <14/50 on modified Oswestry to demonstrate improved QOL Baseline: 20/50 Goal status: INITIAL  6.  Patient will report 75% improvement in LBP in mornings to improve QOL.    Baseline: 14/10 when wakes Goal status: INITIAL   7. Patient will demonstrate improved posture to decrease muscle imbalance. Baseline: forward head, rounded shoulders Goal status: INITIAL    PLAN: PT FREQUENCY: 2x/week  PT DURATION: 8 weeks  PLANNED INTERVENTIONS: Therapeutic exercises, Therapeutic activity, Neuromuscular re-education, Balance training, Gait training, Patient/Family education, Self Care, Joint mobilization, Dry Needling, Electrical stimulation, Spinal mobilization, Cryotherapy, Moist heat, Traction, Ultrasound, Ionotophoresis /ml Dexamethasone, Manual therapy, and Re-evaluation  PLAN FOR NEXT SESSION: review and progress core strengthening exercises, postural strengthening exercises, manual therapy/dry needling, modalities  PRN.    Jena Gauss, PT, DPT  01/19/2022, 12:24 PM

## 2022-01-15 NOTE — Telephone Encounter (Signed)
Pt called requesting a call back from PA [place]. Pt states physical therapy states PA Megan need to add back to pt referral add with neck. Please call pt at  [phone].

## 2022-01-19 ENCOUNTER — Ambulatory Visit: Payer: Medicare Other | Admitting: Rehabilitative and Restorative Service Providers"

## 2022-01-19 ENCOUNTER — Ambulatory Visit: Payer: Medicare Other | Attending: Physical Medicine and Rehabilitation | Admitting: Physical Therapy

## 2022-01-19 ENCOUNTER — Encounter: Payer: Self-pay | Admitting: Physical Therapy

## 2022-01-19 DIAGNOSIS — M5412 Radiculopathy, cervical region: Secondary | ICD-10-CM | POA: Diagnosis not present

## 2022-01-19 DIAGNOSIS — M6281 Muscle weakness (generalized): Secondary | ICD-10-CM | POA: Diagnosis not present

## 2022-01-19 DIAGNOSIS — R252 Cramp and spasm: Secondary | ICD-10-CM | POA: Insufficient documentation

## 2022-01-19 DIAGNOSIS — M7918 Myalgia, other site: Secondary | ICD-10-CM | POA: Insufficient documentation

## 2022-01-19 DIAGNOSIS — M5441 Lumbago with sciatica, right side: Secondary | ICD-10-CM | POA: Insufficient documentation

## 2022-01-19 DIAGNOSIS — M25511 Pain in right shoulder: Secondary | ICD-10-CM | POA: Insufficient documentation

## 2022-01-19 DIAGNOSIS — G518 Other disorders of facial nerve: Secondary | ICD-10-CM | POA: Diagnosis not present

## 2022-01-19 DIAGNOSIS — M791 Myalgia, unspecified site: Secondary | ICD-10-CM | POA: Diagnosis not present

## 2022-01-19 DIAGNOSIS — G8929 Other chronic pain: Secondary | ICD-10-CM | POA: Diagnosis not present

## 2022-01-19 DIAGNOSIS — R293 Abnormal posture: Secondary | ICD-10-CM | POA: Insufficient documentation

## 2022-01-19 DIAGNOSIS — M542 Cervicalgia: Secondary | ICD-10-CM | POA: Insufficient documentation

## 2022-01-19 DIAGNOSIS — G43719 Chronic migraine without aura, intractable, without status migrainosus: Secondary | ICD-10-CM | POA: Diagnosis not present

## 2022-01-21 ENCOUNTER — Other Ambulatory Visit: Payer: Self-pay

## 2022-01-21 ENCOUNTER — Inpatient Hospital Stay: Payer: Medicare Other | Attending: Hematology and Oncology | Admitting: Hematology and Oncology

## 2022-01-21 DIAGNOSIS — Z8042 Family history of malignant neoplasm of prostate: Secondary | ICD-10-CM | POA: Insufficient documentation

## 2022-01-21 DIAGNOSIS — Z803 Family history of malignant neoplasm of breast: Secondary | ICD-10-CM | POA: Diagnosis not present

## 2022-01-21 DIAGNOSIS — Z800 Family history of malignant neoplasm of digestive organs: Secondary | ICD-10-CM | POA: Insufficient documentation

## 2022-01-21 DIAGNOSIS — Z808 Family history of malignant neoplasm of other organs or systems: Secondary | ICD-10-CM | POA: Diagnosis not present

## 2022-01-21 DIAGNOSIS — Z9189 Other specified personal risk factors, not elsewhere classified: Secondary | ICD-10-CM | POA: Insufficient documentation

## 2022-01-21 DIAGNOSIS — Z8371 Family history of colonic polyps: Secondary | ICD-10-CM | POA: Diagnosis not present

## 2022-01-21 DIAGNOSIS — Z79899 Other long term (current) drug therapy: Secondary | ICD-10-CM | POA: Diagnosis not present

## 2022-01-21 NOTE — Progress Notes (Signed)
[facility] CONSULT NOTE  Patient Care Team: Adrian Prince, MD as PCP - General (Endocrinology) Ardell Isaacs, Forrestine Him, MD as Consulting Physician (Obstetrics and Gynecology) Magrinat, Valentino Hue, MD (Inactive) as Consulting Physician (Oncology) Drema Dallas, DO as Consulting Physician (Neurology)  CHIEF COMPLAINTS/PURPOSE OF CONSULTATION:  At high risk for breast cancer  HISTORY OF PRESENTING ILLNESS:  Becky [name] 66 y.o. female is here because of extensive family history of breast cancer.  She used to see Dr. Darnelle Catalan long time ago but that she was referred back to [place] for discussion regarding her risk and to discuss risk reduction measures.  She has extensive family history of breast cancer.  She had genetic testing which was negative for any gene mutations.  I reviewed her records extensively and collaborated the history with the patient.  SUMMARY OF ONCOLOGIC HISTORY: Oncology History   No history exists.     MEDICAL HISTORY:  Past Medical History:  Diagnosis Date   Abnormal ECG    Anemia    Anxiety    Cataract    . Cateracts bil eyes removed   GERD (gastroesophageal reflux disease)    Heart murmur    History of COVID-19 06/14/2020   Hypercholesterolemia    IDDM (insulin dependent diabetes mellitus) 1969   Migraine headache    Neuromuscular disorder (HCC)    bil legs & feet - neuropathy   Neuropathy    Osteoporosis    10/2012   Otosclerosis    Pigmented purpura (HCC)    RA (rheumatoid arthritis) (HCC) 2000   S/P cardiac cath 01/2012   Normal coronaries and normal LV function.   Sjoegren syndrome     SURGICAL HISTORY: Past Surgical History:  Procedure Laterality Date   back ablation  03/2020   back ablation  10/09/2020   BREAST LUMPECTOMY  1995   CARPAL TUNNEL RELEASE  2000   CATARACT EXTRACTION Right 09-05-12   CATARACT EXTRACTION Left 3/15   CESAREAN SECTION  1987   COLONOSCOPY     EYE SURGERY     diabetic retinopathy     HYSTEROSCOPY  02/2001   and D & C   LYMPH NODE DISSECTION  2002   TOE SURGERY  2000   bone removed    SOCIAL HISTORY: Social History   Socioeconomic History   Marital status: Married    Spouse name: Not on file   Number of children: 1   Years of education: Not on file   Highest education level: Not on file  Occupational History   Occupation: disability    Employer: UNEMPLOYED  Tobacco Use   Smoking status: Never   Smokeless tobacco: Never  Vaping Use   Vaping Use: Never used  Substance and Sexual Activity   Alcohol use: Yes    Alcohol/week: 2.0 standard drinks of alcohol    Types: 1 Shots of liquor, 1 Standard drinks or equivalent per week    Comment: maybe 1-2 cocktails per wk   Drug use: No   Sexual activity: Yes    Partners: Male    Birth control/protection: Post-menopausal    Comment: vasectomy/first intercourse <17  Other Topics Concern   Not on file  Social History Narrative   Not on file   Social Determinants of Health   Financial Resource Strain: Not on file  Food Insecurity: Not on file  Transportation Needs: Not on file  Physical Activity: Not on file  Stress: Not on file  Social Connections: Not on  file  Intimate Partner Violence: Not on file    FAMILY HISTORY: Family History  Problem Relation Age of Onset   Breast cancer Mother 64   Colon polyps Mother        unspecified number   Uterine cancer Mother        metastatic   Endometrial cancer Mother 23   Stroke Father 22   Rheum arthritis Sister    Breast cancer Sister 48       lump; (-)GT (breast/ova ca panel, GeneDx)   Breast cancer Maternal Grandmother 51   Skin cancer Maternal Grandfather        unspecified type   Prostate cancer Maternal Grandfather        dx. late 37s   Breast cancer Cousin        bilateral; dx. late 45s   Breast cancer Other        dx. in their late 60s   Breast cancer Other        dx. 78-79   Colon cancer Neg Hx    Esophageal cancer Neg Hx    Rectal cancer  Neg Hx    Stomach cancer Neg Hx     ALLERGIES:  is allergic to chlorhexidine, macrobid [nitrofurantoin macrocrystal], ciprofloxacin, and penicillins.  MEDICATIONS:  Current Outpatient Medications  Medication Sig Dispense Refill   aspirin 81 MG tablet Take 81 mg by mouth daily.     calcium carbonate (OS-CAL) 600 MG TABS Take 600 mg by mouth 2 (two) times daily with a meal.      Continuous Blood Gluc Sensor (FREESTYLE LIBRE 2 SENSOR) MISC CHANGE EVERY 14 DAYS     CONTOUR NEXT TEST test strip   6   cyclobenzaprine (FLEXERIL) 10 MG tablet Take 10 mg by mouth 3 (three) times daily as needed for muscle spasms. Reported on 07/03/2015     denosumab (PROLIA) 60 MG/ML SOSY injection Inject 60 mg into the skin every 6 (six) months.     DULoxetine (CYMBALTA) 30 MG capsule Take 90 mg by mouth daily.     etanercept (ENBREL SURECLICK) 50 MG/ML injection Inject one pen into skin weekly 12 mL 0   famotidine (PEPCID) 20 MG tablet Take 20 mg by mouth 2 (two) times daily.     furosemide (LASIX) 20 MG tablet Take 10 mg by mouth daily.      insulin aspart (NOVOLOG) 100 UNIT/ML injection Insulin pump     losartan (COZAAR) 50 MG tablet Take 50 mg by mouth daily.     polyethylene glycol (MIRALAX / GLYCOLAX) 17 g packet Take 17 g by mouth 2 (two) times daily.      pravastatin (PRAVACHOL) 20 MG tablet Take 20 mg by mouth daily.      SUMAtriptan (IMITREX) 100 MG tablet Take 100 mg by mouth every 2 (two) hours as needed.     topiramate (TOPAMAX) 200 MG tablet Take 200 mg by mouth daily.      triamcinolone cream (KENALOG) 0.1 % APPLY TO AFFECTED AREA TWICE A DAY     vitamin B-12 (CYANOCOBALAMIN) 1000 MCG tablet Take 1,000 mcg by mouth daily.     No current facility-administered medications for this visit.    REVIEW OF SYSTEMS:   Constitutional: Denies fevers, chills or abnormal night sweats Breast:  Denies any palpable lumps or discharge All other systems were reviewed with the patient and are  negative.  PHYSICAL EXAMINATION: ECOG PERFORMANCE STATUS: 1 - Symptomatic but completely ambulatory  Vitals:   01/21/22  1303  BP: (!) 152/76  Pulse: 87  Resp: 18  Temp: 97.7 F (36.5 C)  SpO2: 100%   Filed Weights   01/21/22 1303  Weight: 126 lb 8 oz (57.4 kg)    GENERAL:alert, no distress and comfortable    LABORATORY DATA:  I have reviewed the data as listed Lab Results  Component Value Date   WBC 5.4 12/23/2021   HGB 13.7 12/23/2021   HCT 41.4 12/23/2021   MCV 91.2 12/23/2021   PLT 216 12/23/2021   Lab Results  Component Value Date   NA 142 12/23/2021   K 4.3 12/23/2021   CL 108 12/23/2021   CO2 26 12/23/2021    RADIOGRAPHIC STUDIES: I have personally reviewed the radiological reports and agreed with the findings in the report.  ASSESSMENT AND PLAN:  At high risk for breast cancer 1.  Breast cancer risk: Tyrer-Cuzick: 10-year risk: 17.2% average risk 3.3% Lifetime risk: 28.6% average risk 5.8% Gail 5-year risk: 9.3% average risk 2%  2. Risk reduction strategies: We discussed different risk reducing strategies for breast cancer prevention  3. Lifestyle modification: We discussed different interventions exercise at least 6 days a week including both aerobic as well as weight-bearing exercises and strength training. He discussed dietary modification including increasing number of servings of fruit and vegetables as well as decreased in number of servings of meat. We discussed the importance of maintaining a good BMI. Certainly patient eliminating or limiting alcohol consumption. We discussed smoking cessation are avoiding starting smoking habits.  4. Surveillance: Annual mammograms and breast MRIs as being currently done  5.  Treatment: Discussed risks and benefits of tamoxifen and raloxifene.  Patient's mother took tamoxifen and developed metastatic endometrial cancer and passed away.  Therefore, she does not want to take any such medication.  6. Genetics:  Negative  7. Followup: Return to clinic on an as-needed basis   All questions were answered. The patient knows to call the clinic with any problems, questions or concerns.    Harriette Ohara, MD 01/21/22

## 2022-01-21 NOTE — Assessment & Plan Note (Signed)
1.  Breast cancer risk: Tyrer-Cuzick: 10-year risk: 17.2% average risk 3.3% Lifetime risk: 28.6% average risk 5.8% Gail 5-year risk: 9.3% average risk 2%  2. Risk reduction strategies: We discussed different risk reducing strategies for breast cancer prevention  3. Lifestyle modification: We discussed different interventions exercise at least 6 days a week including both aerobic as well as weight-bearing exercises and strength training. He discussed dietary modification including increasing number of servings of fruit and vegetables as well as decreased in number of servings of meat. We discussed the importance of maintaining a good BMI. Certainly patient eliminating or limiting alcohol consumption. We discussed smoking cessation are avoiding starting smoking habits.  4. Surveillance: Annual mammograms and breast MRIs as being currently done  5.  Treatment: Discussed risks and benefits of tamoxifen and raloxifene.  Patient's mother took tamoxifen and developed metastatic endometrial cancer and passed away.  Therefore, she does not want to take any such medication.  6. Genetics: Negative  7. Followup: Return to clinic on an as-needed basis

## 2022-01-25 ENCOUNTER — Encounter (HOSPITAL_COMMUNITY): Payer: Medicare Other

## 2022-01-25 ENCOUNTER — Telehealth: Payer: Self-pay | Admitting: *Deleted

## 2022-01-25 DIAGNOSIS — L578 Other skin changes due to chronic exposure to nonionizing radiation: Secondary | ICD-10-CM | POA: Diagnosis not present

## 2022-01-25 DIAGNOSIS — L821 Other seborrheic keratosis: Secondary | ICD-10-CM | POA: Diagnosis not present

## 2022-01-25 DIAGNOSIS — L280 Lichen simplex chronicus: Secondary | ICD-10-CM | POA: Diagnosis not present

## 2022-01-25 DIAGNOSIS — L84 Corns and callosities: Secondary | ICD-10-CM | POA: Diagnosis not present

## 2022-01-25 NOTE — Telephone Encounter (Signed)
Spoke with patient and advised patient that per Dr. Corliss Skains, She should get the opinion of the dermatologist.  Will discuss repeat Prolia injection at the follow-up visit on August 24. Patient states she saw the dermatologist today and will have their office note faxed over to [place].

## 2022-01-25 NOTE — Telephone Encounter (Signed)
She should get the opinion of the dermatologist.  Will discuss repeat Prolia injection at the follow-up visit on August 24.

## 2022-01-25 NOTE — Telephone Encounter (Signed)
Patient states she was due to go get her second Prolia injection today. Patient states she has been having some trouble. Patient states she has had a rash. Patient states she is still suffering with this rash as well as hives. Patient states she has also having dry skin. Patient states she is having pain in her back. Patient states she has seen dermatology and her back specialist. Patient is concerned about it all being related to the Prolia. She states her oncologist thinks the rash on her legs is a drug reaction.

## 2022-01-26 ENCOUNTER — Telehealth: Payer: Self-pay | Admitting: Rheumatology

## 2022-01-26 NOTE — Telephone Encounter (Signed)
Patient called the office stating she called yesterday and forgot to mention that she cancelled her Prolia at the hospital. Patient states they told her to have our office call the hospital to cancel as well so it cancels with her insurance.

## 2022-01-26 NOTE — Telephone Encounter (Signed)
Patient mentioned in her phone call yesterday that she did not go for her Prolia. We do not have to cancel anything with the insurance as it will not be billed unless she receives it. To be discussed at her follow up visit on 02/11/2022.

## 2022-01-27 ENCOUNTER — Encounter: Payer: Self-pay | Admitting: Physical Therapy

## 2022-01-27 ENCOUNTER — Ambulatory Visit: Payer: Medicare Other | Admitting: Physical Therapy

## 2022-01-27 DIAGNOSIS — M25511 Pain in right shoulder: Secondary | ICD-10-CM | POA: Diagnosis not present

## 2022-01-27 DIAGNOSIS — M6281 Muscle weakness (generalized): Secondary | ICD-10-CM

## 2022-01-27 DIAGNOSIS — M5441 Lumbago with sciatica, right side: Secondary | ICD-10-CM | POA: Diagnosis not present

## 2022-01-27 DIAGNOSIS — M542 Cervicalgia: Secondary | ICD-10-CM

## 2022-01-27 DIAGNOSIS — R252 Cramp and spasm: Secondary | ICD-10-CM | POA: Diagnosis not present

## 2022-01-27 DIAGNOSIS — R293 Abnormal posture: Secondary | ICD-10-CM

## 2022-01-27 DIAGNOSIS — G8929 Other chronic pain: Secondary | ICD-10-CM | POA: Diagnosis not present

## 2022-01-27 DIAGNOSIS — M5412 Radiculopathy, cervical region: Secondary | ICD-10-CM | POA: Diagnosis not present

## 2022-01-27 DIAGNOSIS — M7918 Myalgia, other site: Secondary | ICD-10-CM | POA: Diagnosis not present

## 2022-01-27 NOTE — Therapy (Signed)
OUTPATIENT PHYSICAL THERAPY CERVICAL EVALUATION   Patient Name: Becky [name] MRN: [id number] DOB:07-26-1955, 66 y.o., female Today's Date: 01/27/2022   PT End of Session - 01/27/22 1317     Visit Number 2    Number of Visits 16    Date for PT Re-Evaluation 03/16/22    Authorization Type [employer]    Progress Note Due on Visit 10    PT Start Time 1317    PT Stop Time 1402    PT Time Calculation (min) 45 min    Activity Tolerance Patient tolerated treatment well    Behavior During Therapy WFL for tasks assessed/performed             Past Medical History:  Diagnosis Date   Abnormal ECG    Anemia    Anxiety    Cataract    . 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   Sjoegren syndrome    Past Surgical History:  Procedure Laterality Date   back ablation  03/2020   back ablation  10/09/2020   BREAST LUMPECTOMY  1995   CARPAL TUNNEL RELEASE  2000   CATARACT EXTRACTION Right 09-05-12   CATARACT EXTRACTION Left 3/15   CESAREAN SECTION  1987   COLONOSCOPY     EYE SURGERY     diabetic retinopathy    HYSTEROSCOPY  02/2001   and D & C   LYMPH NODE DISSECTION  2002   TOE SURGERY  2000   bone removed   Patient Active Problem List   Diagnosis Date Noted   Posterior vitreous detachment of left eye 09/15/2021   Dry eye syndrome, bilateral 09/15/2021   Scoliosis 07/24/2020   Spondylosis without myelopathy or radiculopathy, lumbar region 07/24/2020   Chronic bilateral low back pain without sciatica 07/24/2020   Posterior vitreous  detachment of right eye 03/18/2020   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 determined by examination associated with type 1 diabetes mellitus ([place]) 03/18/2020   Stable treated proliferative diabetic retinopathy of right eye determined by examination associated with type 2 diabetes mellitus ([place]) 03/18/2020   History of vitrectomy [id number]   Complicated UTI (urinary tract infection) 12/13/2018   Failure of outpatient treatment [id number]   Acute metabolic encephalopathy [id number]   Fever 12/10/2018   At high risk for breast cancer 05/09/2018   Eosinophilia 01/31/2017   Iritis 01/04/2017   History of joint swelling 08/24/2016   Osteoarthritis of knees, bilateral 08/24/2016   History of migraine 08/24/2016   History of depression 08/24/2016   History of osteoporosis 08/24/2016   DDD (degenerative disc disease), lumbar 08/24/2016   High risk medication use 08/24/2016   Trigger finger, left middle finger 08/24/2016   Trigger finger, right ring finger 08/24/2016   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   Increased risk of breast cancer 07/06/2015   IDDM (insulin dependent diabetes mellitus) 07/06/2015   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 without organ or systems involvement ([place]) 07/06/2015  Sjogren's syndrome ([place]) 07/06/2015   Genetic testing 05/12/2015   Family history of breast cancer 04/15/2015   Fibrocystic breast disease 10/11/2012   GERD 10/01/2009   Chest pain with moderate risk for cardiac etiology 09/02/2009   DYSPHAGIA 09/02/2009   ANXIETY 08/15/2009   ULCER-GASTRIC 08/15/2009   CONSTIPATION 08/15/2009   SLOW TRANSIT CONSTIPATION 08/15/2009   FLATULENCE ERUCTATION AND GAS PAIN 08/15/2009    PCP: Reynold Bowen, MD  REFERRING PROVIDER: Lorine Bears, NP  REFERRING DIAG: [phone] (ICD-10-CM) - Radiculopathy, cervical region M25.511,G89.29 (ICD-10-CM) - Chronic right shoulder pain M54.2 (ICD-10-CM) -  Cervicalgia M79.18 (ICD-10-CM) - Myofascial pain syndrome  THERAPY DIAG:  Cervicalgia  Chronic bilateral low back pain with right-sided sciatica  Chronic right shoulder pain  Abnormal posture  Muscle weakness (generalized)  Cramp and spasm  Rationale for Evaluation and Treatment Rehabilitation  ONSET DATE: several years  SUBJECTIVE:                                                                                                                                                                                                         SUBJECTIVE STATEMENT: Patient reports neck is not too bad, 4/10, back is 7/10. "I've been doing my exercises every day."  PERTINENT HISTORY:  Chronic low back pain with radiculopathy, myofascial trigger point injections every 6 weeks, back ablation, scoliosis, osteoporosis, RA, Type 1 DM, neuropathy, Sjogren's syndrome  PAIN:  Are you having pain? Yes: NPRS scale: 4/10 Pain location: R side neck radiating to R shoulder  Pain description: achy, dull, severe Aggravating factors: sleeping (side sleeper, wakes with 10/10 pain),  Relieving factors: trigger point injections for migraines.   Are you having pain? Yes: NPRS scale: 7/10 Pain location: low back both sides occasionally radiates down R hip.  Pain description: constant, severe,  Aggravating factors: worse in mornings Relieving factors: improves with movement, heat  PRECAUTIONS: None  WEIGHT BEARING RESTRICTIONS No  FALLS:  Has patient fallen in last 6 months? No  feels unsteady due to severe neuropathy in feet.   LIVING ENVIRONMENT: Lives with: lives with their spouse Lives in: House/apartment Stairs: No Has following equipment at home: None  OCCUPATION: retired,  walks 30 min daily  PLOF: Independent  PATIENT GOALS: improve neck and back pain.   OBJECTIVE:   DIAGNOSTIC FINDINGS:  MRI Cervical Spine 01/12/2022  IMPRESSION: 1. C4-C5 mild spinal canal stenosis and mild right  neural foraminal narrowing. 2. C6-C7 moderate right and mild left neural foraminal narrowing. 3. C3-C4 mild right neural foraminal narrowing. MRI Lumbar spine 01/12/2022  IMPRESSION:  1. L1-L2 mild right neural foraminal narrowing. Narrowing of the right lateral recess at this level could affect the descending right L2 nerve roots. 2. Multilevel facet arthropathy, which can be a cause of back pain.   PATIENT SURVEYS:  Modified Oswestry 20/50= 40% moderate disability  NDI 24/50 = 48% moderate disability    COGNITION: Overall cognitive status: Within functional limits for tasks assessed   SENSATION: Severe neuropathy bil feet.   POSTURE: rounded shoulders and forward head - right shoulder forward more than left.   PALPATION: Hypomobility throughout cervical, upper thoracic, and lumbar spine.  Tenderness R UT, levator, suboccipitals and cervical erector spinae. Elevated R 1st rib, forward R shoulder.  Tenderness L2-3, bil QL (R>L), and lumbar multifidi.   CERVICAL ROM:   Active ROM A/PROM (deg) eval  Flexion 40  Extension 50  Right lateral flexion 20  Left lateral flexion 20  Right rotation 55  Left rotation 60   (Blank rows = not tested)  LUMBAR ROM:   Active  A/PROM  eval  Flexion To shins *  Extension 90% limited *  Right lateral flexion To knees   Left lateral flexion To knees inc pain compared to R  Right rotation WNL  Left rotation WNL*   (Blank rows = not tested)  *pain  UPPER EXTREMITY ROM:  Active ROM Right eval Left eval  Shoulder flexion 130* 135  Shoulder extension 55* 65  Shoulder abduction 115* 115  Shoulder adduction    Shoulder extension    Shoulder internal rotation functional low back * functional midback  Shoulder external rotation FER T1* FER t2   (Blank rows = not tested)  *pain  UPPER EXTREMITY MMT:  MMT Right* eval Left eval  Shoulder flexion 4+* 5  Shoulder extension    Shoulder abduction 5 5  Shoulder adduction 5 5   Shoulder extension    Shoulder internal rotation 5 5  Shoulder external rotation 5 5  Elbow flexion 5 5  Elbow extension 5 5  Wrist flexion 5 5  Wrist extension 5 5  Grip strength good good   (Blank rows = not tested) *pain  LOWER EXTREMITY MMT:    MMT Right eval Left eval  Hip flexion 5 5  Hip extension    Hip abduction 5 5  Hip adduction 5 5  Hip internal rotation    Hip external rotation    Knee flexion 5 5  Knee extension 5 5  Ankle dorsiflexion 5 5  Ankle plantarflexion     (Blank rows = not tested)    CERVICAL SPECIAL TESTS:  Spurling's test: Negative   FUNCTIONAL TESTS:  5 times sit to stand: 11.64 seconds without UE assist.    TODAY'S TREATMENT:  01/27/2022 - Therapeutic Exercise: to improve strength and mobility.   Bike L1 x 6 min  LTR x 10 to side of preference, cues to stay in Pain free ROM PPT x 10 PPT with hip adduction squeeze x 10  Chin tucks in supine x 10 - cues to not push into pain.  Prone on elbows x 1 min Prone knee bends x 10 bil - cues to not push into pain  Manual Therapy: to decrease muscle spasm and pain and improve mobility Cervical spine in supine: STM/TPR to cervical paraspinals, NAGs into rotation, PA mobs cervical and upper thoracic spine.  Lumbar in prone: IASTM with foam roller to lumbar paraspinals and glutes, PA mobs lumbar spine, MFR to bil QL.   01/19/2022 - Self  Care - see patient education.  Therapeutic Exercise: to improve strength and mobility.  Demo, verbal and tactile cues throughout for technique. - Supine Lower Trunk Rotation   10 reps - Supine Posterior Pelvic Tilt  10 reps - Static Prone on Elbows x 1 min - Prone Knee Flexion 10 reps bil   PATIENT EDUCATION:  Education details: HEP review and progression, pain neuroscience introduced Person educated: Patient Education method: Programmer, multimedia, Demonstration, Verbal cues, and Handouts Education comprehension: verbalized understanding and returned  demonstration   HOME EXERCISE PROGRAM: Access Code: [email or web address]   ASSESSMENT:  CLINICAL IMPRESSION: Becky [name] reported compliance with initial HEP, but with review noted pushing into pain free ROM.  Educated throughout to stay in pain free ROM, can also work side of preference to shorten rather than lengthen tight muscles to decrease pain and irritation.  Also added chin tucks to HEP today.  She tolerated exercises well without pain.  Manual therapy to both neck and lumbar region, noted excessive hypomobility throughout spine.  Reported decreased pain following interventions.  Becky [name] continues to demonstrate potential for improvement and would benefit from continued skilled therapy to address impairments.      OBJECTIVE IMPAIRMENTS decreased activity tolerance, decreased balance, decreased endurance, decreased ROM, decreased strength, hypomobility, increased fascial restrictions, impaired perceived functional ability, increased muscle spasms, impaired flexibility, impaired UE functional use, improper body mechanics, postural dysfunction, and pain.   ACTIVITY LIMITATIONS carrying, lifting, bending, sleeping, bed mobility, dressing, and reach over head  PARTICIPATION LIMITATIONS: meal prep, cleaning, laundry, driving, shopping, community activity, and yard work  PERSONAL FACTORS 3+ comorbidities: Chronic low back pain, migraines, scoliosis, osteoporosis, RA, IDDM, neuropathy, Sjogren's syndrome  are also affecting patient's functional outcome.   REHAB POTENTIAL: Good  CLINICAL DECISION MAKING: Evolving/moderate complexity  EVALUATION COMPLEXITY: Moderate   GOALS: Goals reviewed with patient? Yes  SHORT TERM GOALS: Target date: 02/09/2022   Patient will be independent with initial HEP.  Baseline: given for back Goal status: IN PROGRESS   LONG TERM GOALS: Target date: 03/16/2022   Patient will be independent with advanced/ongoing HEP to improve outcomes and  carryover.  Baseline: needs progression Goal status: IN PROGRESS  2.  Patient will report 75% improvement in neck pain when sleeping/waking to improve QOL.  Baseline: severe pain in mornings.  Goal status: IN PROGRESS  3.  Patient will demonstrate full pain free cervical ROM for safety with driving.  Baseline: see objective Goal status: IN PROGRESS  4.  Patient will report <17/50 on NDI  to demonstrate improved functional ability. Baseline: 24/50 Goal status: IN PROGRESS  5.   Patient will report <14/50 on modified Oswestry to demonstrate improved QOL Baseline: 20/50 Goal status: IN PROGRESS  6.  Patient will report 75% improvement in LBP in mornings to improve QOL.    Baseline: 14/10 when wakes Goal status: IN PROGRESS   7. Patient will demonstrate improved posture to decrease muscle imbalance. Baseline: forward head, rounded shoulders Goal status: IN PROGRESS    PLAN: PT FREQUENCY: 2x/week  PT DURATION: 8 weeks  PLANNED INTERVENTIONS: Therapeutic exercises, Therapeutic activity, Neuromuscular re-education, Balance training, Gait training, Patient/Family education, Self Care, Joint mobilization, Dry Needling, Electrical stimulation, Spinal mobilization, Cryotherapy, Moist heat, Traction, Ultrasound, Ionotophoresis 4mg /ml Dexamethasone, Manual therapy, and Re-evaluation  PLAN FOR NEXT SESSION: review and progress core strengthening exercises- neutral spine, postural strengthening exercises, manual therapy, dry needling (for back, getting TrP Injections in neck already), modalities PRN.    , PT, DPT  01/27/2022, 4:59 PM

## 2022-01-28 NOTE — Progress Notes (Signed)
Office Visit Note  [name]             Date of Birth: 1955-06-25           MRN: [id number]             PCP: Adrian Prince, MD Referring: Adrian Prince, MD Visit Date: 02/11/2022 Occupation: @GUAROCC @  Subjective:  Medication management  History of Present Illness: Becky [name] is a 66 y.o. female with history of seronegative rheumatoid arthritis, osteoarthritis, degenerative disc disease, Sjogren's and osteoporosis.  She states she has been taking Enbrel 50 mg subcu weekly without any side effects.  She denies any increased joint swelling.  She continues to have some discomfort in her left thumb.  She gives history of dry mouth and dry eyes.  She has been followed by her ophthalmologist closely.  She states she had her last Prolia injection on July 27, 2021.  Since then she has noticed a rash on her lower extremities.  She states the rash developed all over her body and then gradually the rash on her upper extremities and torso resolved and her lower extremity rash persists.  She was evaluated by her oncologist to advise that the rash was an allergic reaction most likely due to Prolia.  Patient believes that the rash is related to Prolia.  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  Activities of Daily Living:  Patient reports morning stiffness for a few minutes.   Patient Denies nocturnal pain.  Difficulty dressing/grooming: Denies Difficulty climbing stairs: Reports Difficulty getting out of chair: Reports Difficulty using hands for taps, buttons, cutlery, and/or writing: Reports  Review of Systems  Constitutional:  Positive for fatigue.  HENT:  Positive for mouth dryness. Negative for mouth sores.   Eyes:  Positive for dryness.  Respiratory:  Negative for shortness of breath.   Cardiovascular:   Negative for chest pain and palpitations.  Gastrointestinal:  Positive for constipation. Negative for blood in stool and diarrhea.  Endocrine: Negative for increased urination.  Genitourinary:  Negative for involuntary urination.  Musculoskeletal:  Positive for joint pain, joint pain, myalgias, morning stiffness, muscle tenderness and myalgias. Negative for gait problem, joint swelling and muscle weakness.  Skin:  Positive for rash and hair loss. Negative for color change and sensitivity to sunlight.  Allergic/Immunologic: Negative for susceptible to infections.  Neurological:  Positive for dizziness and headaches.  Hematological:  Negative for swollen glands.  Psychiatric/Behavioral:  Negative for depressed mood and sleep disturbance. The patient is not nervous/anxious.     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 determined by examination associated with type 1 diabetes mellitus (HCC) 03/18/2020   Stable treated proliferative diabetic retinopathy of right eye determined by examination associated with type 2 diabetes mellitus (HCC) 03/18/2020   History of vitrectomy 03/18/2020   Complicated UTI (urinary tract infection) 12/13/2018   Failure of outpatient treatment 12/11/2018   Acute metabolic  encephalopathy 12/11/2018   Fever 12/10/2018   At high risk for breast cancer 05/09/2018   Eosinophilia 01/31/2017   Iritis 01/04/2017   History of joint swelling 08/24/2016   Osteoarthritis of knees, bilateral 08/24/2016   History of migraine 08/24/2016   History of depression  08/24/2016   History of osteoporosis 08/24/2016   DDD (degenerative disc disease), lumbar 08/24/2016   High risk medication use 08/24/2016   Trigger finger, left middle finger 08/24/2016   Trigger finger, right ring finger 08/24/2016   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   Increased risk of breast cancer 07/06/2015   IDDM (insulin dependent diabetes mellitus) 07/06/2015   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 without organ or systems involvement ([place]) 07/06/2015   Sjogren's syndrome ([place]) 07/06/2015   Genetic testing 05/12/2015   Family history of breast cancer 04/15/2015   Fibrocystic breast disease 10/11/2012   GERD 10/01/2009   Chest pain with moderate risk for cardiac etiology 09/02/2009   DYSPHAGIA 09/02/2009   ANXIETY 08/15/2009   ULCER-GASTRIC 08/15/2009   CONSTIPATION 08/15/2009   SLOW TRANSIT CONSTIPATION 08/15/2009   FLATULENCE ERUCTATION AND GAS PAIN 08/15/2009    Past Medical History:  Diagnosis Date   Abnormal ECG    Anemia    Anxiety    Cataract    . 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   Sjoegren syndrome     Family History  Problem Relation Age of Onset   Breast cancer Mother 59   Colon polyps Mother        unspecified number   Uterine cancer Mother        metastatic   Endometrial cancer Mother 35   Stroke Father 50   Rheum arthritis Sister    Breast cancer Sister 51       lump; (-)GT (breast/ova ca panel, Gene[facility])   Breast cancer Maternal Grandmother 51   Skin cancer Maternal Grandfather         unspecified type   Prostate cancer Maternal Grandfather        dx. late 80s   Breast cancer Cousin        bilateral; dx. late 47s   Breast cancer Other        dx. in their late 60s   Breast cancer Other        dx. 78-79   Colon cancer Neg Hx    Esophageal cancer Neg Hx    Rectal cancer Neg Hx    Stomach cancer Neg Hx    Past Surgical History:  Procedure Laterality Date   back ablation  03/2020   back ablation  10/09/2020   BREAST LUMPECTOMY  1995   CARPAL TUNNEL RELEASE  2000   CATARACT EXTRACTION Right 09-05-12   CATARACT EXTRACTION Left 3/15   CESAREAN SECTION  1987   COLONOSCOPY     EYE SURGERY     diabetic retinopathy    HYSTEROSCOPY  02/2001   and D & C   LYMPH NODE  DISSECTION  2002   TOE SURGERY  2000   bone removed   Social History   Social History Narrative   Not on file   Immunization History  Administered Date(s) Administered   PFIZER(Purple Top)SARS-COV-2 Vaccination 07/11/2019, 08/01/2019, 02/05/2020, 09/15/2020   Tdap 06/21/2013     Objective: Vital Signs: BP 124/70 (BP Location: Right Arm, Patient Position: Sitting, Cuff Size: Normal)   Pulse 88   Resp 14   Ht 5\' 4"  (1.626 m)   Wt 126 lb 6.4 oz (57.3 kg)   LMP 09/19/2008   BMI 21.70 kg/m    Physical Exam Vitals and nursing note reviewed.  Constitutional:      Appearance: She is well-developed.  HENT:     Head: Normocephalic and atraumatic.  Eyes:     Conjunctiva/sclera: Conjunctivae normal.  Cardiovascular:     Rate and Rhythm: Normal rate and regular rhythm.     Heart sounds: Normal heart sounds.  Pulmonary:     Effort: Pulmonary effort is normal.     Breath sounds: Normal breath sounds.  Abdominal:     General: Bowel sounds are normal.     Palpations: Abdomen is soft.  Musculoskeletal:     Cervical back: Normal range of motion.  Lymphadenopathy:     Cervical: No cervical adenopathy.  Skin:    General: Skin is warm and dry.     Capillary Refill: Capillary refill takes less than  2 seconds.  Neurological:     Mental Status: She is alert and oriented to person, place, and time.  Psychiatric:        Behavior: Behavior normal.      Musculoskeletal Exam: C-spine was in good range of motion.  Shoulder joints, elbow joints, wrist joints, MCPs PIPs and DIPs were in good range of motion.  She had bilateral incomplete fist formation due to PIP and DIP thickening.  No synovitis was noted.  Hip joints and knee joints in good range of motion.  She had no tenderness over ankles or MTPs.  CDAI Exam: CDAI Score: 0.4  Patient Global: 2 mm; Provider Global: 2 mm Swollen: 0 ; Tender: 0  Joint Exam 02/11/2022   No joint exam has been documented for this visit   There is currently no information documented on the homunculus. Go to the Rheumatology activity and complete the homunculus joint exam.  Investigation: No additional findings.  Imaging: No results found.  Recent Labs: Lab Results  Component Value Date   WBC 5.4 12/23/2021   HGB 13.7 12/23/2021   PLT 216 12/23/2021   NA 142 12/23/2021   K 4.3 12/23/2021   CL 108 12/23/2021   CO2 26 12/23/2021   GLUCOSE 120 (H) 12/23/2021   BUN 20 12/23/2021   CREATININE 1.02 12/23/2021   BILITOT 0.4 12/23/2021   ALKPHOS 79 05/13/2020   AST 33 12/23/2021   ALT 23 12/23/2021   PROT 7.0 12/23/2021   ALBUMIN 3.9 05/13/2020   CALCIUM 9.7 12/23/2021   GFRAA 66 12/17/2020   QFTBGOLD NEGATIVE 04/06/2017   QFTBGOLDPLUS NEGATIVE 07/10/2021    Speciality Comments: Recieves Enbrel Sureclick through 07/12/2021. Prolia start 01/12/21  Procedures:  No procedures performed Allergies: Chlorhexidine, Denosumab, Macrobid [nitrofurantoin macrocrystal], Ciprofloxacin, and Penicillins   Assessment / Plan:     Visit Diagnoses: 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 without organ or systems involvement (HCC)-she continues to have some discomfort in her left thumb.  No synovitis was noted.  All  other joints  were in full range of motion.  She had decreased fist formation in her hands.  She has been taking Enbrel 50 mg subcu weekly without any side effects.    High risk medication use - Enbrel 50 mg sq injections once weekly. Discontinued methotrexate in May 2021.  Labs obtained on December 23, 2021 CBC and CMP were normal.  TB gold was negative on July 10, 2021.  I advised her to get labs in October and every 3 months to monitor for drug toxicity.  Information regarding immunization was placed in the AVS.  She was advised to stop Enbrel if she develops an infection and resume after the infection resolves.  Iritis-no recurrence  Sjogrens-she can history of dry mouth and dry eyes.  Over-the-counter products were discussed.  She is also followed by her ophthalmologist.  n's syndrome with keratoconjunctivitis sicca (HCC)  Trigger thumb, left thumb-she has intermittent discomfort.  Primary osteoarthritis of both knees - s/p synvisc bilateral knees, 08/2021.  She had good response to Visco supplement injections.  She denies discomfort in her knee joints today.  DDD (degenerative disc disease), cervical-she has some limitation with lateral rotation.  DDD (degenerative disc disease), lumbar-she can use to have pain and discomfort in her lower back.  She states she had cortisone injection in her back which gave her minimal relief.  She is going to physical therapy which has been helpful.  Age-related osteoporosis without current pathological fracture - DEXA ordered by Dr. Evlyn Kanner.  Prolia injections x3.  Her most recent injection was administered on 07/27/21.  Patient is concerned that Prolia caused her rash.  She does not want to take Prolia anymore.  Increased risk of for fractures especially vertebral fractures after stopping Prolia was discussed.  Patient wants to discuss further treatment with Dr. Evlyn Kanner.  I advised her to schedule an appointment with Dr. Evlyn Kanner.  I do not have recent DEXA scan reports  on her.  Rash and other nonspecific skin eruption-rash with noted on her lower extremities.  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  Patient states she was given a topical cortisone cream which she stopped using.  I advised her to schedule a follow-up appointment with the dermatologist.  History of migraine  Other fatigue-she has been experiencing increased fatigue which she relates to Prolia injection.  History of diabetes mellitus  History of gastroesophageal reflux (GERD)  History of depression  Orders: No orders of the defined types were placed in this encounter.  No orders of the defined types were placed in this encounter.  Face time spent patient was 35 minutes.  More than 50% time spent counseling and coordination of care.  Follow-Up Instructions: Return in about 5 months (around 07/14/2022) for Rheumatoid arthritis, Osteoporosis.   Pollyann Savoy, MD  Note - This record has been created using Animal nutritionist.  Chart creation errors have been sought, but may not always  have been located. Such creation errors do not reflect on  the standard of medical care.

## 2022-02-03 ENCOUNTER — Ambulatory Visit: Payer: Medicare Other

## 2022-02-03 DIAGNOSIS — M25511 Pain in right shoulder: Secondary | ICD-10-CM | POA: Diagnosis not present

## 2022-02-03 DIAGNOSIS — M542 Cervicalgia: Secondary | ICD-10-CM | POA: Diagnosis not present

## 2022-02-03 DIAGNOSIS — M5412 Radiculopathy, cervical region: Secondary | ICD-10-CM | POA: Diagnosis not present

## 2022-02-03 DIAGNOSIS — M7918 Myalgia, other site: Secondary | ICD-10-CM | POA: Diagnosis not present

## 2022-02-03 DIAGNOSIS — M6281 Muscle weakness (generalized): Secondary | ICD-10-CM

## 2022-02-03 DIAGNOSIS — R293 Abnormal posture: Secondary | ICD-10-CM

## 2022-02-03 DIAGNOSIS — G8929 Other chronic pain: Secondary | ICD-10-CM

## 2022-02-03 DIAGNOSIS — M5441 Lumbago with sciatica, right side: Secondary | ICD-10-CM | POA: Diagnosis not present

## 2022-02-03 DIAGNOSIS — R252 Cramp and spasm: Secondary | ICD-10-CM

## 2022-02-03 NOTE — Progress Notes (Signed)
Pt to discuss Prolia at OV on 02/11/22  Chesley Mires, PharmD, MPH, BCPS, CPP Clinical Pharmacist (Rheumatology and Pulmonology)

## 2022-02-03 NOTE — Therapy (Signed)
OUTPATIENT PHYSICAL THERAPY TREATMENT   Patient Name: ARNECIA ECTOR MRN: [id number] DOB:03-16-1956, 66 y.o., female Today's Date: 02/03/2022   PT End of Session - 02/03/22 1322     Visit Number 3    Number of Visits 16    Date for PT Re-Evaluation 03/16/22    Authorization Type [employer]    Progress Note Due on Visit 10    PT Start Time 1315    PT Stop Time 1358    PT Time Calculation (min) 43 min    Activity Tolerance Patient tolerated treatment well    Behavior During Therapy WFL for tasks assessed/performed              Past Medical History:  Diagnosis Date   Abnormal ECG    Anemia    Anxiety    Cataract    . Cateracts bil eyes removed   GERD (gastroesophageal reflux disease)    Heart murmur    History of COVID-19 06/14/2020   Hypercholesterolemia    IDDM (insulin dependent diabetes mellitus) 1969   Migraine headache    Neuromuscular disorder (HCC)    bil legs & feet - neuropathy   Neuropathy    Osteoporosis    10/2012   Otosclerosis    Pigmented purpura (HCC)    RA (rheumatoid arthritis) (HCC) 2000   S/P cardiac cath 01/2012   Normal coronaries and normal LV function.   Sjoegren syndrome    Past Surgical History:  Procedure Laterality Date   back ablation  03/2020   back ablation  10/09/2020   BREAST LUMPECTOMY  1995   CARPAL TUNNEL RELEASE  2000   CATARACT EXTRACTION Right 09-05-12   CATARACT EXTRACTION Left 3/15   CESAREAN SECTION  1987   COLONOSCOPY     EYE SURGERY     diabetic retinopathy    HYSTEROSCOPY  02/2001   and D & C   LYMPH NODE DISSECTION  2002   TOE SURGERY  2000   bone removed   Patient Active Problem List   Diagnosis Date Noted   Posterior vitreous detachment of left eye 09/15/2021   Dry eye syndrome, bilateral 09/15/2021   Scoliosis 07/24/2020   Spondylosis without myelopathy or radiculopathy, lumbar region 07/24/2020   Chronic bilateral low back pain without sciatica 07/24/2020   Posterior vitreous detachment of  right eye 03/18/2020   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 determined by examination associated with type 1 diabetes mellitus (HCC) 03/18/2020   Stable treated proliferative diabetic retinopathy of right eye determined by examination associated with type 2 diabetes mellitus (HCC) 03/18/2020   History of vitrectomy 03/18/2020   Complicated UTI (urinary tract infection) 12/13/2018   Failure of outpatient treatment 12/11/2018   Acute metabolic encephalopathy 12/11/2018   Fever 12/10/2018   At high risk for breast cancer 05/09/2018   Eosinophilia 01/31/2017   Iritis 01/04/2017   History of joint swelling 08/24/2016   Osteoarthritis of knees, bilateral 08/24/2016   History of migraine 08/24/2016   History of depression 08/24/2016   History of osteoporosis 08/24/2016   DDD (degenerative disc disease), lumbar 08/24/2016   High risk medication use 08/24/2016   Trigger finger, left middle finger 08/24/2016   Trigger finger, right ring finger 08/24/2016   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   Increased risk of breast cancer 07/06/2015   IDDM (insulin dependent diabetes mellitus) 07/06/2015   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 without organ or systems involvement (HCC) 07/06/2015  Sjogren's syndrome (HCC) 07/06/2015   Genetic testing 05/12/2015   Family history of breast cancer 04/15/2015   Fibrocystic breast disease 10/11/2012   GERD 10/01/2009   Chest pain with moderate risk for cardiac etiology 09/02/2009   DYSPHAGIA 09/02/2009   ANXIETY 08/15/2009   ULCER-GASTRIC 08/15/2009   CONSTIPATION 08/15/2009   SLOW TRANSIT CONSTIPATION 08/15/2009   FLATULENCE ERUCTATION AND GAS PAIN 08/15/2009    PCP: Adrian Prince, MD  REFERRING PROVIDER: Juanda Chance, NP  REFERRING DIAG: [phone] (ICD-10-CM) - Radiculopathy, cervical region M25.511,G89.29 (ICD-10-CM) - Chronic right shoulder pain M54.2 (ICD-10-CM) -  Cervicalgia M79.18 (ICD-10-CM) - Myofascial pain syndrome  THERAPY DIAG:  Cervicalgia  Chronic bilateral low back pain with right-sided sciatica  Chronic right shoulder pain  Abnormal posture  Muscle weakness (generalized)  Cramp and spasm  Rationale for Evaluation and Treatment Rehabilitation  ONSET DATE: several years  SUBJECTIVE:                                                                                                                                                                                                         SUBJECTIVE STATEMENT: Patient reports the neck hurting worse than back today.  PERTINENT HISTORY:  Chronic low back pain with radiculopathy, myofascial trigger point injections every 6 weeks, back ablation, scoliosis, osteoporosis, RA, Type 1 DM, neuropathy, Sjogren's syndrome  PAIN:  Are you having pain? Yes: NPRS scale: 6/10 Pain location: R side neck radiating to R shoulder  Pain description: achy, dull, severe Aggravating factors: sleeping (side sleeper, wakes with 10/10 pain),  Relieving factors: trigger point injections for migraines.   Are you having pain? Yes: NPRS scale: 5/10 Pain location: low back both sides occasionally radiates down R hip.  Pain description: constant, severe,  Aggravating factors: worse in mornings Relieving factors: improves with movement, heat  PRECAUTIONS: None  WEIGHT BEARING RESTRICTIONS No  FALLS:  Has patient fallen in last 6 months? No  feels unsteady due to severe neuropathy in feet.   LIVING ENVIRONMENT: Lives with: lives with their spouse Lives in: House/apartment Stairs: No Has following equipment at home: None  OCCUPATION: retired,  walks 30 min daily  PLOF: Independent  PATIENT GOALS: improve neck and back pain.   OBJECTIVE:   DIAGNOSTIC FINDINGS:  MRI Cervical Spine 01/12/2022  IMPRESSION: 1. C4-C5 mild spinal canal stenosis and mild right neural foraminal narrowing. 2. C6-C7  moderate right and mild left neural foraminal narrowing. 3. C3-C4 mild right neural foraminal narrowing. MRI Lumbar spine 01/12/2022  IMPRESSION: 1. L1-L2 mild right neural foraminal narrowing. Narrowing of  the right lateral recess at this level could affect the descending right L2 nerve roots. 2. Multilevel facet arthropathy, which can be a cause of back pain.   PATIENT SURVEYS:  Modified Oswestry 20/50= 40% moderate disability  NDI 24/50 = 48% moderate disability    COGNITION: Overall cognitive status: Within functional limits for tasks assessed   SENSATION: Severe neuropathy bil feet.   POSTURE: rounded shoulders and forward head - right shoulder forward more than left.   PALPATION: Hypomobility throughout cervical, upper thoracic, and lumbar spine.  Tenderness R UT, levator, suboccipitals and cervical erector spinae. Elevated R 1st rib, forward R shoulder.  Tenderness L2-3, bil QL (R>L), and lumbar multifidi.   CERVICAL ROM:   Active ROM A/PROM (deg) eval  Flexion 40  Extension 50  Right lateral flexion 20  Left lateral flexion 20  Right rotation 55  Left rotation 60   (Blank rows = not tested)  LUMBAR ROM:   Active  A/PROM  eval  Flexion To shins *  Extension 90% limited *  Right lateral flexion To knees   Left lateral flexion To knees inc pain compared to R  Right rotation WNL  Left rotation WNL*   (Blank rows = not tested)  *pain  UPPER EXTREMITY ROM:  Active ROM Right eval Left eval  Shoulder flexion 130* 135  Shoulder extension 55* 65  Shoulder abduction 115* 115  Shoulder adduction    Shoulder extension    Shoulder internal rotation functional low back * functional midback  Shoulder external rotation FER T1* FER t2   (Blank rows = not tested)  *pain  UPPER EXTREMITY MMT:  MMT Right* eval Left eval  Shoulder flexion 4+* 5  Shoulder extension    Shoulder abduction 5 5  Shoulder adduction 5 5  Shoulder extension    Shoulder internal  rotation 5 5  Shoulder external rotation 5 5  Elbow flexion 5 5  Elbow extension 5 5  Wrist flexion 5 5  Wrist extension 5 5  Grip strength good good   (Blank rows = not tested) *pain  LOWER EXTREMITY MMT:    MMT Right eval Left eval  Hip flexion 5 5  Hip extension    Hip abduction 5 5  Hip adduction 5 5  Hip internal rotation    Hip external rotation    Knee flexion 5 5  Knee extension 5 5  Ankle dorsiflexion 5 5  Ankle plantarflexion     (Blank rows = not tested)    CERVICAL SPECIAL TESTS:  Spurling's test: Negative   FUNCTIONAL TESTS:  5 times sit to stand: 11.64 seconds without UE assist.    TODAY'S TREATMENT:  02/03/22 Therapeutic Exercise: Nustep L3x69min Standing shoulder squeeze against doorframe for tactile feedback x 10 Wall push ups x 10 Standing row w/ red TB x 10 - cues not to shrug shoulders Standing extension w/ red TB x 10 POE x 1 min Prone hip extension x 10 Cat/cow in quadruped x 10  Manual Therapy: STM B  suboccipitals, UT, LS, cerv PS  01/27/2022 - Therapeutic Exercise: to improve strength and mobility.   Bike L1 x 6 min  LTR x 10 to side of preference, cues to stay in Pain free ROM PPT x 10 PPT with hip adduction squeeze x 10  Chin tucks in supine x 10 - cues to not push into pain.  Prone on elbows x 1 min Prone knee bends x 10 bil - cues to not push into  pain  Manual Therapy: to decrease muscle spasm and pain and improve mobility Cervical spine in supine: STM/TPR to cervical paraspinals, NAGs into rotation, PA mobs cervical and upper thoracic spine.  Lumbar in prone: IASTM with foam roller to lumbar paraspinals and glutes, PA mobs lumbar spine, MFR to bil QL.   01/19/2022 - Self Care - see patient education.  Therapeutic Exercise: to improve strength and mobility.  Demo, verbal and tactile cues throughout for technique. - Supine Lower Trunk Rotation   10 reps - Supine Posterior Pelvic Tilt  10 reps - Static Prone on Elbows x 1 min -  Prone Knee Flexion 10 reps bil   PATIENT EDUCATION:  Education details: HEP progression - row and ext w/ TB Person educated: Patient Education method: Programmer, multimedia, Demonstration, Verbal cues, and Handouts Education comprehension: verbalized understanding and returned demonstration   HOME EXERCISE PROGRAM: Access Code: [email or web address]   ASSESSMENT:  CLINICAL IMPRESSION: [name] demonstrated a good tolerance for exercises. Updated postural strengthening exercises, providing instruction for retraction of scapula and depression. Cues required with scap stab exercises to avoid shrugging. MT done post session to address tension along neck and shoulders, TTP mostly in R suboccipitals.    OBJECTIVE IMPAIRMENTS decreased activity tolerance, decreased balance, decreased endurance, decreased ROM, decreased strength, hypomobility, increased fascial restrictions, impaired perceived functional ability, increased muscle spasms, impaired flexibility, impaired UE functional use, improper body mechanics, postural dysfunction, and pain.   ACTIVITY LIMITATIONS carrying, lifting, bending, sleeping, bed mobility, dressing, and reach over head  PARTICIPATION LIMITATIONS: meal prep, cleaning, laundry, driving, shopping, community activity, and yard work  PERSONAL FACTORS 3+ comorbidities: Chronic low back pain, migraines, scoliosis, osteoporosis, RA, IDDM, neuropathy, Sjogren's syndrome  are also affecting patient's functional outcome.   REHAB POTENTIAL: Good  CLINICAL DECISION MAKING: Evolving/moderate complexity  EVALUATION COMPLEXITY: Moderate   GOALS: Goals reviewed with patient? Yes  SHORT TERM GOALS: Target date: 02/09/2022   Patient will be independent with initial HEP.  Baseline: given for back Goal status: IN PROGRESS   LONG TERM GOALS: Target date: 03/16/2022   Patient will be independent with advanced/ongoing HEP to improve outcomes and carryover.  Baseline: needs progression Goal status:  IN PROGRESS  2.  Patient will report 75% improvement in neck pain when sleeping/waking to improve QOL.  Baseline: severe pain in mornings.  Goal status: IN PROGRESS  3.  Patient will demonstrate full pain free cervical ROM for safety with driving.  Baseline: see objective Goal status: IN PROGRESS  4.  Patient will report <17/50 on NDI  to demonstrate improved functional ability. Baseline: 24/50 Goal status: IN PROGRESS  5.   Patient will report <14/50 on modified Oswestry to demonstrate improved QOL Baseline: 20/50 Goal status: IN PROGRESS  6.  Patient will report 75% improvement in LBP in mornings to improve QOL.    Baseline: 14/10 when wakes Goal status: IN PROGRESS   7. Patient will demonstrate improved posture to decrease muscle imbalance. Baseline: forward head, rounded shoulders Goal status: IN PROGRESS    PLAN: PT FREQUENCY: 2x/week  PT DURATION: 8 weeks  PLANNED INTERVENTIONS: Therapeutic exercises, Therapeutic activity, Neuromuscular re-education, Balance training, Gait training, Patient/Family education, Self Care, Joint mobilization, Dry Needling, Electrical stimulation, Spinal mobilization, Cryotherapy, Moist heat, Traction, Ultrasound, Ionotophoresis /ml Dexamethasone, Manual therapy, and Re-evaluation  PLAN FOR NEXT SESSION: review and progress core strengthening exercises- neutral spine, postural strengthening exercises, manual therapy, dry needling (for back, getting TrP Injections in neck already), modalities PRN.    Darleene Cleaver, PTA  02/03/2022, 2:03 PM

## 2022-02-04 DIAGNOSIS — E785 Hyperlipidemia, unspecified: Secondary | ICD-10-CM | POA: Diagnosis not present

## 2022-02-04 DIAGNOSIS — N1831 Chronic kidney disease, stage 3a: Secondary | ICD-10-CM | POA: Diagnosis not present

## 2022-02-04 DIAGNOSIS — E1040 Type 1 diabetes mellitus with diabetic neuropathy, unspecified: Secondary | ICD-10-CM | POA: Diagnosis not present

## 2022-02-04 DIAGNOSIS — I129 Hypertensive chronic kidney disease with stage 1 through stage 4 chronic kidney disease, or unspecified chronic kidney disease: Secondary | ICD-10-CM | POA: Diagnosis not present

## 2022-02-09 ENCOUNTER — Ambulatory Visit: Payer: Medicare Other | Admitting: Physical Therapy

## 2022-02-09 ENCOUNTER — Encounter: Payer: Self-pay | Admitting: Physical Therapy

## 2022-02-09 DIAGNOSIS — M5441 Lumbago with sciatica, right side: Secondary | ICD-10-CM | POA: Diagnosis not present

## 2022-02-09 DIAGNOSIS — M542 Cervicalgia: Secondary | ICD-10-CM

## 2022-02-09 DIAGNOSIS — R293 Abnormal posture: Secondary | ICD-10-CM

## 2022-02-09 DIAGNOSIS — M7918 Myalgia, other site: Secondary | ICD-10-CM | POA: Diagnosis not present

## 2022-02-09 DIAGNOSIS — M6281 Muscle weakness (generalized): Secondary | ICD-10-CM

## 2022-02-09 DIAGNOSIS — G8929 Other chronic pain: Secondary | ICD-10-CM

## 2022-02-09 DIAGNOSIS — M5412 Radiculopathy, cervical region: Secondary | ICD-10-CM | POA: Diagnosis not present

## 2022-02-09 DIAGNOSIS — R252 Cramp and spasm: Secondary | ICD-10-CM | POA: Diagnosis not present

## 2022-02-09 DIAGNOSIS — M25511 Pain in right shoulder: Secondary | ICD-10-CM | POA: Diagnosis not present

## 2022-02-09 NOTE — Therapy (Signed)
OUTPATIENT PHYSICAL THERAPY TREATMENT   Patient Name: Becky [name] MRN: [id number] DOB:[birth year], 66 y.o., female Today's Date: 02/09/2022   PT End of Session - 02/09/22 1353     Visit Number 4    Number of Visits 16    Date for PT Re-Evaluation 03/16/22    Authorization Type [employer]    Progress Note Due on Visit 10    PT Start Time 1315    PT Stop Time 1358    PT Time Calculation (min) 43 min    Activity Tolerance Patient tolerated treatment well    Behavior During Therapy WFL for tasks assessed/performed               Past Medical History:  Diagnosis Date   Abnormal ECG    Anemia    Anxiety    Cataract    . Cateracts bil eyes removed   GERD (gastroesophageal reflux disease)    Heart murmur    History of COVID-19 06/14/2020   Hypercholesterolemia    IDDM (insulin dependent diabetes mellitus) 1969   Migraine headache    Neuromuscular disorder (HCC)    bil legs & feet - neuropathy   Neuropathy    Osteoporosis    10/2012   Otosclerosis    Pigmented purpura (HCC)    RA (rheumatoid arthritis) (HCC) 2000   S/P cardiac cath 01/2012   Normal coronaries and normal LV function.   Sjoegren syndrome    Past Surgical History:  Procedure Laterality Date   back ablation  03/2020   back ablation  10/09/2020   BREAST LUMPECTOMY  1995   CARPAL TUNNEL RELEASE  2000   CATARACT EXTRACTION Right 09-05-12   CATARACT EXTRACTION Left 3/15   CESAREAN SECTION  1987   COLONOSCOPY     EYE SURGERY     diabetic retinopathy    HYSTEROSCOPY  02/2001   and D & C   LYMPH NODE DISSECTION  2002   TOE SURGERY  2000   bone removed   Patient Active Problem List   Diagnosis Date Noted   Posterior vitreous detachment of left eye 09/15/2021   Dry eye syndrome, bilateral 09/15/2021   Scoliosis 07/24/2020   Spondylosis without myelopathy or radiculopathy, lumbar region 07/24/2020   Chronic bilateral low back pain without sciatica 07/24/2020   Posterior vitreous detachment  of right eye 03/18/2020   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 determined by examination associated with type 1 diabetes mellitus (HCC) 03/18/2020   Stable treated proliferative diabetic retinopathy of right eye determined by examination associated with type 2 diabetes mellitus (HCC) 03/18/2020   History of vitrectomy 03/18/2020   Complicated UTI (urinary tract infection) 12/13/2018   Failure of outpatient treatment 12/11/2018   Acute metabolic encephalopathy 12/11/2018   Fever 12/10/2018   At high risk for breast cancer 05/09/2018   Eosinophilia 01/31/2017   Iritis 01/04/2017   History of joint swelling 08/24/2016   Osteoarthritis of knees, bilateral 08/24/2016   History of migraine 08/24/2016   History of depression 08/24/2016   History of osteoporosis 08/24/2016   DDD (degenerative disc disease), lumbar 08/24/2016   High risk medication use 08/24/2016   Trigger finger, left middle finger 08/24/2016   Trigger finger, right ring finger 08/24/2016   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   Increased risk of breast cancer 07/06/2015   IDDM (insulin dependent diabetes mellitus) 07/06/2015   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 without organ or systems involvement (HCC) 07/06/2015  Sjogren's syndrome (HCC) 07/06/2015   Genetic testing 05/12/2015   Family history of breast cancer 04/15/2015   Fibrocystic breast disease 10/11/2012   GERD 10/01/2009   Chest pain with moderate risk for cardiac etiology 09/02/2009   DYSPHAGIA 09/02/2009   ANXIETY 08/15/2009   ULCER-GASTRIC 08/15/2009   CONSTIPATION 08/15/2009   SLOW TRANSIT CONSTIPATION 08/15/2009   FLATULENCE ERUCTATION AND GAS PAIN 08/15/2009    PCP: Adrian Prince, MD  REFERRING PROVIDER: Juanda Chance, NP  REFERRING DIAG: [id number] (ICD-10-CM) - Radiculopathy, cervical region M25.511,G89.29 (ICD-10-CM) - Chronic right shoulder pain M54.2 (ICD-10-CM) -  Cervicalgia M79.18 (ICD-10-CM) - Myofascial pain syndrome  THERAPY DIAG:  Cervicalgia  Chronic bilateral low back pain with right-sided sciatica  Chronic right shoulder pain  Abnormal posture  Muscle weakness (generalized)  Cramp and spasm  Rationale for Evaluation and Treatment Rehabilitation  ONSET DATE: several years  SUBJECTIVE:                                                                                                                                                                                                         SUBJECTIVE STATEMENT:  My back and my neck are more flared up today, I think it was bc of Prolea shot that I got as I've had a lot of side effects from this in the past. Most recent Prolea shot was in February hopefully it is getting out of my system now. Last time we were working on getting my posture better but my low back is bothering me the most today would like to focus on that.   PERTINENT HISTORY:  Chronic low back pain with radiculopathy, myofascial trigger point injections every 6 weeks, back ablation, scoliosis, osteoporosis, RA, Type 1 DM, neuropathy, Sjogren's syndrome  PAIN:  Are you having pain? Yes: NPRS scale: 6/10 Pain location: R side neck radiating to R shoulder  Pain description: achy, dull, severe Aggravating factors: sleeping (side sleeper, wakes with 10/10 pain),  Relieving factors: trigger point injections for migraines.   Are you having pain? Yes: NPRS scale: 5/10 Pain location: low back both sides occasionally radiates down R hip.  Pain description: constant, severe,  Aggravating factors: worse in mornings Relieving factors: improves with movement, heat  PRECAUTIONS: None  WEIGHT BEARING RESTRICTIONS No  FALLS:  Has patient fallen in last 6 months? No  feels unsteady due to severe neuropathy in feet.   LIVING ENVIRONMENT: Lives with: lives with their spouse Lives in: House/apartment Stairs: No Has following  equipment at home: None  OCCUPATION: retired,  walks 30 min daily  PLOF: Independent  PATIENT GOALS: improve neck and back pain.   OBJECTIVE:   DIAGNOSTIC FINDINGS:  MRI Cervical Spine 01/12/2022  IMPRESSION: 1. C4-C5 mild spinal canal stenosis and mild right neural foraminal narrowing. 2. C6-C7 moderate right and mild left neural foraminal narrowing. 3. C3-C4 mild right neural foraminal narrowing. MRI Lumbar spine 01/12/2022  IMPRESSION: 1. L1-L2 mild right neural foraminal narrowing. Narrowing of the right lateral recess at this level could affect the descending right L2 nerve roots. 2. Multilevel facet arthropathy, which can be a cause of back pain.   PATIENT SURVEYS:  Modified Oswestry 20/50= 40% moderate disability  NDI 24/50 = 48% moderate disability    COGNITION: Overall cognitive status: Within functional limits for tasks assessed   SENSATION: Severe neuropathy bil feet.   POSTURE: rounded shoulders and forward head - right shoulder forward more than left.   PALPATION: Hypomobility throughout cervical, upper thoracic, and lumbar spine.  Tenderness R UT, levator, suboccipitals and cervical erector spinae. Elevated R 1st rib, forward R shoulder.  Tenderness L2-3, bil QL (R>L), and lumbar multifidi.   CERVICAL ROM:   Active ROM A/PROM (deg) eval  Flexion 40  Extension 50  Right lateral flexion 20  Left lateral flexion 20  Right rotation 55  Left rotation 60   (Blank rows = not tested)  LUMBAR ROM:   Active  A/PROM  eval  Flexion To shins *  Extension 90% limited *  Right lateral flexion To knees   Left lateral flexion To knees inc pain compared to R  Right rotation WNL  Left rotation WNL*   (Blank rows = not tested)  *pain  UPPER EXTREMITY ROM:  Active ROM Right eval Left eval  Shoulder flexion 130* 135  Shoulder extension 55* 65  Shoulder abduction 115* 115  Shoulder adduction    Shoulder extension    Shoulder internal rotation functional  low back * functional midback  Shoulder external rotation FER T1* FER t2   (Blank rows = not tested)  *pain  UPPER EXTREMITY MMT:  MMT Right* eval Left eval  Shoulder flexion 4+* 5  Shoulder extension    Shoulder abduction 5 5  Shoulder adduction 5 5  Shoulder extension    Shoulder internal rotation 5 5  Shoulder external rotation 5 5  Elbow flexion 5 5  Elbow extension 5 5  Wrist flexion 5 5  Wrist extension 5 5  Grip strength good good   (Blank rows = not tested) *pain  LOWER EXTREMITY MMT:    MMT Right eval Left eval  Hip flexion 5 5  Hip extension    Hip abduction 5 5  Hip adduction 5 5  Hip internal rotation    Hip external rotation    Knee flexion 5 5  Knee extension 5 5  Ankle dorsiflexion 5 5  Ankle plantarflexion     (Blank rows = not tested)    CERVICAL SPECIAL TESTS:  Spurling's test: Negative   FUNCTIONAL TESTS:  5 times sit to stand: 11.64 seconds without UE assist.    TODAY'S TREATMENT:   02/08/22  TherEx:   SKTC 5x5 second holds  Lumbar rotations 5x5 second holds DKTC 5x5 second holds  HS and piriformis stretches 2x30 seconds B  Hip flexor stretches 2x30 seconds B  Bridges x15  Walking bridges 1x10  Sidelying hip abduction x10  Cat cow x15  Quadruped TrA sets 1x15 3 second holds            Quadruped TrA  sets with alternating UE raise 1x10            Hip hikes 2x10 with and without swing   02/03/22 Therapeutic Exercise: Nustep L3x61min Standing shoulder squeeze against doorframe for tactile feedback x 10 Wall push ups x 10 Standing row w/ red TB x 10 - cues not to shrug shoulders Standing extension w/ red TB x 10 POE x 1 min Prone hip extension x 10 Cat/cow in quadruped x 10  Manual Therapy: STM B  suboccipitals, UT, LS, cerv PS  01/27/2022 - Therapeutic Exercise: to improve strength and mobility.   Bike L1 x 6 min  LTR x 10 to side of preference, cues to stay in Pain free ROM PPT x 10 PPT with hip adduction squeeze x 10   Chin tucks in supine x 10 - cues to not push into pain.  Prone on elbows x 1 min Prone knee bends x 10 bil - cues to not push into pain  Manual Therapy: to decrease muscle spasm and pain and improve mobility Cervical spine in supine: STM/TPR to cervical paraspinals, NAGs into rotation, PA mobs cervical and upper thoracic spine.  Lumbar in prone: IASTM with foam roller to lumbar paraspinals and glutes, PA mobs lumbar spine, MFR to bil QL.   01/19/2022 - Self Care - see patient education.  Therapeutic Exercise: to improve strength and mobility.  Demo, verbal and tactile cues throughout for technique. - Supine Lower Trunk Rotation   10 reps - Supine Posterior Pelvic Tilt  10 reps - Static Prone on Elbows x 1 min - Prone Knee Flexion 10 reps bil   PATIENT EDUCATION:  Education details: HEP progression - row and ext w/ TB Person educated: Patient Education method: Programmer, multimedia, Demonstration, Verbal cues, and Handouts Education comprehension: verbalized understanding and returned demonstration   HOME EXERCISE PROGRAM: Access Code: [email or web address]   ASSESSMENT:  CLINICAL IMPRESSION:  Becky [name] arrived feeling OK today, low back more flared up today- requested we focus on lumbar interventions this afternoon. Worked on mobility and core/hip strength as able and tolerated, did well today.     OBJECTIVE IMPAIRMENTS decreased activity tolerance, decreased balance, decreased endurance, decreased ROM, decreased strength, hypomobility, increased fascial restrictions, impaired perceived functional ability, increased muscle spasms, impaired flexibility, impaired UE functional use, improper body mechanics, postural dysfunction, and pain.   ACTIVITY LIMITATIONS carrying, lifting, bending, sleeping, bed mobility, dressing, and reach over head  PARTICIPATION LIMITATIONS: meal prep, cleaning, laundry, driving, shopping, community activity, and yard work  PERSONAL FACTORS 3+ comorbidities: Chronic low back  pain, migraines, scoliosis, osteoporosis, RA, IDDM, neuropathy, Sjogren's syndrome  are also affecting patient's functional outcome.   REHAB POTENTIAL: Good  CLINICAL DECISION MAKING: Evolving/moderate complexity  EVALUATION COMPLEXITY: Moderate   GOALS: Goals reviewed with patient? Yes  SHORT TERM GOALS: Target date: 02/09/2022   Patient will be independent with initial HEP.  Baseline: given for back Goal status: IN PROGRESS   LONG TERM GOALS: Target date: 03/16/2022   Patient will be independent with advanced/ongoing HEP to improve outcomes and carryover.  Baseline: needs progression Goal status: IN PROGRESS  2.  Patient will report 75% improvement in neck pain when sleeping/waking to improve QOL.  Baseline: severe pain in mornings.  Goal status: IN PROGRESS  3.  Patient will demonstrate full pain free cervical ROM for safety with driving.  Baseline: see objective Goal status: IN PROGRESS  4.  Patient will report <17/50 on NDI  to demonstrate improved functional ability. Baseline: 24/50 Goal status:  IN PROGRESS  5.   Patient will report <14/50 on modified Oswestry to demonstrate improved QOL Baseline: 20/50 Goal status: IN PROGRESS  6.  Patient will report 75% improvement in LBP in mornings to improve QOL.    Baseline: 14/10 when wakes Goal status: IN PROGRESS   7. Patient will demonstrate improved posture to decrease muscle imbalance. Baseline: forward head, rounded shoulders Goal status: IN PROGRESS    PLAN: PT FREQUENCY: 2x/week  PT DURATION: 8 weeks  PLANNED INTERVENTIONS: Therapeutic exercises, Therapeutic activity, Neuromuscular re-education, Balance training, Gait training, Patient/Family education, Self Care, Joint mobilization, Dry Needling, Electrical stimulation, Spinal mobilization, Cryotherapy, Moist heat, Traction, Ultrasound, Ionotophoresis /ml Dexamethasone, Manual therapy, and Re-evaluation  PLAN FOR NEXT SESSION: review and progress core  strengthening exercises- neutral spine, postural strengthening exercises, manual therapy, dry needling (for back, getting TrP Injections in neck already), modalities PRN.    Lerry Liner PT DPT PN2  02/09/2022, 1:59 PM

## 2022-02-10 DIAGNOSIS — N1831 Chronic kidney disease, stage 3a: Secondary | ICD-10-CM | POA: Diagnosis not present

## 2022-02-10 DIAGNOSIS — E103493 Type 1 diabetes mellitus with severe nonproliferative diabetic retinopathy without macular edema, bilateral: Secondary | ICD-10-CM | POA: Diagnosis not present

## 2022-02-10 DIAGNOSIS — E785 Hyperlipidemia, unspecified: Secondary | ICD-10-CM | POA: Diagnosis not present

## 2022-02-10 DIAGNOSIS — E1040 Type 1 diabetes mellitus with diabetic neuropathy, unspecified: Secondary | ICD-10-CM | POA: Diagnosis not present

## 2022-02-11 ENCOUNTER — Encounter: Payer: Self-pay | Admitting: Rheumatology

## 2022-02-11 ENCOUNTER — Encounter: Payer: Medicare Other | Admitting: Physical Therapy

## 2022-02-11 ENCOUNTER — Ambulatory Visit: Payer: Medicare Other | Attending: Rheumatology | Admitting: Rheumatology

## 2022-02-11 VITALS — BP 124/70 | HR 88 | Resp 14 | Ht 64.0 in | Wt 126.4 lb

## 2022-02-11 DIAGNOSIS — M51369 Other intervertebral disc degeneration, lumbar region without mention of lumbar back pain or lower extremity pain: Secondary | ICD-10-CM

## 2022-02-11 DIAGNOSIS — Z8639 Personal history of other endocrine, nutritional and metabolic disease: Secondary | ICD-10-CM

## 2022-02-11 DIAGNOSIS — M5030 Other cervical disc degeneration, unspecified cervical region: Secondary | ICD-10-CM

## 2022-02-11 DIAGNOSIS — Z79899 Other long term (current) drug therapy: Secondary | ICD-10-CM

## 2022-02-11 DIAGNOSIS — M5136 Other intervertebral disc degeneration, lumbar region: Secondary | ICD-10-CM

## 2022-02-11 DIAGNOSIS — Z8659 Personal history of other mental and behavioral disorders: Secondary | ICD-10-CM

## 2022-02-11 DIAGNOSIS — M0579 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 without organ or systems involvement: Secondary | ICD-10-CM

## 2022-02-11 DIAGNOSIS — M810 Age-related osteoporosis without current pathological fracture: Secondary | ICD-10-CM

## 2022-02-11 DIAGNOSIS — Z8719 Personal history of other diseases of the digestive system: Secondary | ICD-10-CM

## 2022-02-11 DIAGNOSIS — M170 Bilateral primary osteoarthritis of knee: Secondary | ICD-10-CM

## 2022-02-11 DIAGNOSIS — H209 Unspecified iridocyclitis: Secondary | ICD-10-CM

## 2022-02-11 DIAGNOSIS — M3501 Sicca syndrome with keratoconjunctivitis: Secondary | ICD-10-CM

## 2022-02-11 DIAGNOSIS — R5383 Other fatigue: Secondary | ICD-10-CM

## 2022-02-11 DIAGNOSIS — R21 Rash and other nonspecific skin eruption: Secondary | ICD-10-CM

## 2022-02-11 DIAGNOSIS — Z8669 Personal history of other diseases of the nervous system and sense organs: Secondary | ICD-10-CM

## 2022-02-11 DIAGNOSIS — M65312 Trigger thumb, left thumb: Secondary | ICD-10-CM

## 2022-02-11 NOTE — Patient Instructions (Signed)
Vaccines You are taking a medication(s) that can suppress your immune system.  The following immunizations are recommended: Flu annually Covid-19  Td/Tdap (tetanus, diphtheria, pertussis) every 10 years Pneumonia (Prevnar 15 then Pneumovax 23 at least 1 year apart.  Alternatively, can take Prevnar 20 without needing additional dose) Shingrix: 2 doses from 4 weeks to 6 months apart  Please check with your PCP to make sure you are up to date.  If you have signs or symptoms of an infection or start antibiotics: First, call your PCP for workup of your infection. Hold your medication through the infection, until you complete your antibiotics, and until symptoms resolve if you take the following: Injectable medication (Actemra, Benlysta, Cimzia, Cosentyx, Enbrel, Humira, Kevzara, Orencia, Remicade, Simponi, Stelara, Taltz, Tremfya) Methotrexate Leflunomide (Arava) Mycophenolate (Cellcept) Harriette Ohara, Olumiant, or Rinvoq  Standing Labs We placed an order today for your standing lab work.   Please have your standing labs drawn in October and every 3 months  If possible, please have your labs drawn 2 weeks prior to your appointment so that the provider can discuss your results at your appointment.  Please note that you may see your imaging and lab results in MyChart before we have reviewed them. We may be awaiting multiple results to interpret others before contacting you. Please allow our office up to 72 hours to thoroughly review all of the results before contacting the office for clarification of your results.  We currently have open lab daily: Monday through Thursday from 1:30 PM-4:30 PM and Friday from 1:30 PM- 4:00 PM If possible, please come for your lab work on Monday, Thursday or Friday afternoons, as you may experience shorter wait times.   Effective April 21, 2022 the new lab hours will change to: Monday through Thursday from 1:30 PM-5:00 PM and Friday from 8:30 AM-12:00 PM If  possible, please come for your lab work on Monday and Thursday afternoons, as you may experience shorter wait times.  Please be advised, all patients with office appointments requiring lab work will take precedent over walk-in lab work.    The office is located at [address], [place], [place] [address] No appointment is necessary.   Labs are drawn by [facility]. Please bring your co-pay at the time of your lab draw.  You may receive a bill from [facility] for your lab work.  Please note if you are on Hydroxychloroquine and and an order has been placed for a Hydroxychloroquine level, you will need to have it drawn 4 hours or more after your last dose.  If you wish to have your labs drawn at another location, please call the office 24 hours in advance to send orders.  If you have any questions regarding directions or hours of operation,  please call [phone].   As a reminder, please drink plenty of water prior to coming for your lab work. Thanks!

## 2022-02-12 NOTE — Progress Notes (Addendum)
Patient had OV with Dr. Corliss Skains yesterday 02/11/22. She believes rash is due to Prolia and does not want to continue medication. She will f/u with her PCP Dr. Evlyn Kanner for next treatment options. Closing encounter  Chesley Mires, PharmD, MPH, BCPS, CPP Clinical Pharmacist (Rheumatology and Pulmonology)

## 2022-02-16 ENCOUNTER — Ambulatory Visit: Payer: Medicare Other | Admitting: Physical Therapy

## 2022-02-18 ENCOUNTER — Ambulatory Visit: Payer: Medicare Other

## 2022-02-24 ENCOUNTER — Encounter: Payer: Medicare Other | Admitting: Physical Therapy

## 2022-03-02 ENCOUNTER — Ambulatory Visit: Payer: Medicare Other | Admitting: Physical Therapy

## 2022-03-04 ENCOUNTER — Ambulatory Visit: Payer: Medicare Other | Admitting: Physical Therapy

## 2022-03-09 ENCOUNTER — Encounter: Payer: Self-pay | Admitting: Physical Therapy

## 2022-03-09 ENCOUNTER — Ambulatory Visit: Payer: Medicare Other | Attending: Physical Medicine and Rehabilitation | Admitting: Physical Therapy

## 2022-03-09 DIAGNOSIS — M5441 Lumbago with sciatica, right side: Secondary | ICD-10-CM | POA: Diagnosis not present

## 2022-03-09 DIAGNOSIS — G8929 Other chronic pain: Secondary | ICD-10-CM | POA: Insufficient documentation

## 2022-03-09 DIAGNOSIS — M25511 Pain in right shoulder: Secondary | ICD-10-CM | POA: Insufficient documentation

## 2022-03-09 DIAGNOSIS — M6281 Muscle weakness (generalized): Secondary | ICD-10-CM | POA: Diagnosis not present

## 2022-03-09 DIAGNOSIS — R252 Cramp and spasm: Secondary | ICD-10-CM | POA: Insufficient documentation

## 2022-03-09 DIAGNOSIS — M542 Cervicalgia: Secondary | ICD-10-CM | POA: Insufficient documentation

## 2022-03-09 DIAGNOSIS — R293 Abnormal posture: Secondary | ICD-10-CM | POA: Diagnosis not present

## 2022-03-09 NOTE — Patient Instructions (Signed)

## 2022-03-09 NOTE — Therapy (Signed)
OUTPATIENT PHYSICAL THERAPY TREATMENT   Patient Name: Becky [name] MRN: [id number] DOB:Mar 29, 1956, 66 y.o., female Today's Date: 03/09/2022   PT End of Session - 03/09/22 1319     Visit Number 5    Number of Visits 16    Date for PT Re-Evaluation 03/16/22    Authorization Type [employer]    Progress Note Due on Visit 10    PT Start Time 1316    PT Stop Time 1405    PT Time Calculation (min) 49 min    Activity Tolerance Patient tolerated treatment well    Behavior During Therapy WFL for tasks assessed/performed               Past Medical History:  Diagnosis Date   Abnormal ECG    Anemia    Anxiety    Cataract    . 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   Sjoegren syndrome    Past Surgical History:  Procedure Laterality Date   back ablation  03/2020   back ablation  10/09/2020   BREAST LUMPECTOMY  1995   CARPAL TUNNEL RELEASE  2000   CATARACT EXTRACTION Right 09-05-12   CATARACT EXTRACTION Left 3/15   CESAREAN SECTION  1987   COLONOSCOPY     EYE SURGERY     diabetic retinopathy    HYSTEROSCOPY  02/2001   and D & C   LYMPH NODE DISSECTION  2002   TOE SURGERY  2000   bone removed   Patient Active Problem List   Diagnosis Date Noted   Posterior vitreous detachment of left eye 09/15/2021   Dry eye syndrome, bilateral 09/15/2021   Scoliosis 07/24/2020   Spondylosis without myelopathy or radiculopathy, lumbar region 07/24/2020   Chronic bilateral low back pain without sciatica 07/24/2020   Posterior vitreous detachment  of right eye 03/18/2020   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 determined by examination associated with type 1 diabetes mellitus ([place]) 03/18/2020   Stable treated proliferative diabetic retinopathy of right eye determined by examination associated with type 2 diabetes mellitus ([place]) 03/18/2020   History of vitrectomy 70/96/2836   Complicated UTI (urinary tract infection) 12/13/2018   Failure of outpatient treatment 62/94/7654   Acute metabolic encephalopathy 65/08/5463   Fever 12/10/2018   At high risk for breast cancer 05/09/2018   Eosinophilia 01/31/2017   Iritis 01/04/2017   History of joint swelling 08/24/2016   Osteoarthritis of knees, bilateral 08/24/2016   History of migraine 08/24/2016   History of depression 08/24/2016   History of osteoporosis 08/24/2016   DDD (degenerative disc disease), lumbar 08/24/2016   High risk medication use 08/24/2016   Trigger finger, left middle finger 08/24/2016   Trigger finger, right ring finger 08/24/2016   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   Increased risk of breast cancer 07/06/2015   IDDM (insulin dependent diabetes mellitus) 07/06/2015   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 without organ or systems involvement ([place]) 07/06/2015  Sjogren's syndrome ([place]) 07/06/2015   Genetic testing 05/12/2015   Family history of breast cancer 04/15/2015   Fibrocystic breast disease 10/11/2012   GERD 10/01/2009   Chest pain with moderate risk for cardiac etiology 09/02/2009   DYSPHAGIA 09/02/2009   ANXIETY 08/15/2009   ULCER-GASTRIC 08/15/2009   CONSTIPATION 08/15/2009   SLOW TRANSIT CONSTIPATION 08/15/2009   FLATULENCE ERUCTATION AND GAS PAIN 08/15/2009    PCP: Reynold Bowen, MD  REFERRING PROVIDER: Lorine Bears, NP  REFERRING DIAG: [phone] (ICD-10-CM) - Radiculopathy, cervical region M25.511,G89.29 (ICD-10-CM) - Chronic right shoulder pain M54.2 (ICD-10-CM) -  Cervicalgia M79.18 (ICD-10-CM) - Myofascial pain syndrome  THERAPY DIAG:  Cervicalgia  Chronic bilateral low back pain with right-sided sciatica  Chronic right shoulder pain  Abnormal posture  Muscle weakness (generalized)  Cramp and spasm  Rationale for Evaluation and Treatment Rehabilitation  ONSET DATE: several years  SUBJECTIVE:                                                                                                                                                                                                         SUBJECTIVE STATEMENT: "When can I get that acupuncture?"  She missed last couple of weeks of therapy due to [place].   Her back is feeling better, but her neck is "not so good."  Some days really bad, 17/10.   She thought she'd remember the back exercises but didn't get a sheet of the exercises and forgot them.    PERTINENT HISTORY:  Chronic low back pain with radiculopathy, myofascial trigger point injections every 6 weeks, back ablation, scoliosis, osteoporosis, RA, Type 1 DM, neuropathy, Sjogren's syndrome  PAIN:  Are you having pain? Yes: NPRS scale: 6/10 Pain location: R side neck radiating to R shoulder  Pain description: achy, dull, severe Aggravating factors: sleeping (side sleeper, wakes with 10/10 pain),  Relieving factors: trigger point injections for migraines.   Are you having pain? Yes: NPRS scale: 5/10 Pain location: low back both sides occasionally radiates down R hip.  Pain description: constant, severe,  Aggravating factors: worse in mornings Relieving factors: improves with movement, heat  PRECAUTIONS: None  WEIGHT BEARING RESTRICTIONS No  FALLS:  Has patient fallen in last 6 months? No  feels unsteady due to severe neuropathy in feet.   LIVING ENVIRONMENT: Lives with: lives with their spouse Lives in: House/apartment Stairs: No Has following equipment at home: None  OCCUPATION: retired,  walks 30 min daily  PLOF:  Independent  PATIENT GOALS: improve neck and back pain.   OBJECTIVE:   DIAGNOSTIC FINDINGS:  MRI Cervical Spine 01/12/2022  IMPRESSION: 1. C4-C5 mild spinal canal stenosis and mild right neural foraminal narrowing. 2. C6-C7 moderate right and mild left neural foraminal narrowing. 3. C3-C4 mild right neural foraminal narrowing. MRI Lumbar spine 01/12/2022  IMPRESSION: 1. L1-L2 mild right neural foraminal narrowing. Narrowing of the right lateral recess at this level could affect the descending right L2 nerve roots. 2. Multilevel facet arthropathy, which can be a cause of back pain.   PATIENT SURVEYS:  Modified Oswestry 20/50= 40% moderate disability  NDI 24/50 = 48% moderate disability    COGNITION: Overall cognitive status: Within functional limits for tasks assessed   SENSATION: Severe neuropathy bil feet.   POSTURE: rounded shoulders and forward head - right shoulder forward more than left.   PALPATION: Hypomobility throughout cervical, upper thoracic, and lumbar spine.  Tenderness R UT, levator, suboccipitals and cervical erector spinae. Elevated R 1st rib, forward R shoulder.  Tenderness L2-3, bil QL (R>L), and lumbar multifidi.   CERVICAL ROM:   Active ROM A/PROM (deg) eval  Flexion 40  Extension 50  Right lateral flexion 20  Left lateral flexion 20  Right rotation 55  Left rotation 60   (Blank rows = not tested)  LUMBAR ROM:   Active  A/PROM  eval  Flexion To shins *  Extension 90% limited *  Right lateral flexion To knees   Left lateral flexion To knees inc pain compared to R  Right rotation WNL  Left rotation WNL*   (Blank rows = not tested)  *pain  UPPER EXTREMITY ROM:  Active ROM Right eval Left eval  Shoulder flexion 130* 135  Shoulder extension 55* 65  Shoulder abduction 115* 115  Shoulder adduction    Shoulder extension    Shoulder internal rotation functional low back * functional midback  Shoulder external rotation FER T1* FER t2    (Blank rows = not tested)  *pain  UPPER EXTREMITY MMT:  MMT Right* eval Left eval  Shoulder flexion 4+* 5  Shoulder extension    Shoulder abduction 5 5  Shoulder adduction 5 5  Shoulder extension    Shoulder internal rotation 5 5  Shoulder external rotation 5 5  Elbow flexion 5 5  Elbow extension 5 5  Wrist flexion 5 5  Wrist extension 5 5  Grip strength good good   (Blank rows = not tested) *pain  LOWER EXTREMITY MMT:    MMT Right eval Left eval  Hip flexion 5 5  Hip extension    Hip abduction 5 5  Hip adduction 5 5  Hip internal rotation    Hip external rotation    Knee flexion 5 5  Knee extension 5 5  Ankle dorsiflexion 5 5  Ankle plantarflexion     (Blank rows = not tested)    CERVICAL SPECIAL TESTS:  Spurling's test: Negative   FUNCTIONAL TESTS:  5 times sit to stand: 11.64 seconds without UE assist.    TODAY'S TREATMENT:  03/09/2022  Therapeutic Exercise: to improve strength and mobility.  Demo, verbal and tactile cues throughout for technique. Bike L2 x 8 min  Cat/cows x 15 Quadruped leg extensions x 10 Bird dogs x 10 - CGA for safety After manual therapy- Cervical AROM Levator stretch Upper trap stretch Retro shoulder circles Review and progression HEP Manual Therapy: to decrease muscle spasm and pain and improve mobility STM/TPR to R upper trap, cervical paraspinals, levator scapulae; skilled palpation and monitoring during dry needling. Trigger Point Dry-Needling  Treatment instructions:  Expect mild to moderate muscle soreness. S/S of pneumothorax if dry needled over a lung field, and to seek immediate medical attention should they occur. Patient verbalized understanding of these instructions and education.  Patient Consent Given: Yes Education handout provided: Yes Muscles treated: R cervical multifidi C4-6, R UT, R levator Treatment response/outcome: Twitch Response Elicited and Palpable Increase in Muscle Length   02/09/22 TherEx:    SKTC 5x5 second holds  Lumbar rotations 5x5 second holds DKTC 5x5 second holds  HS and piriformis stretches 2x30 seconds B  Hip flexor stretches 2x30 seconds B  Bridges x15  Walking bridges 1x10  Sidelying hip abduction x10  Cat cow x15  Quadruped TrA sets 1x15 3 second holds            Quadruped TrA sets with alternating UE raise 1x10            Hip hikes 2x10 with and without swing   02/03/22 Therapeutic Exercise: Nustep L3x24min Standing shoulder squeeze against doorframe for tactile feedback x 10 Wall push ups x 10 Standing row w/ red TB x 10 - cues not to shrug shoulders Standing extension w/ red TB x 10 POE x 1 min Prone hip extension x 10 Cat/cow in quadruped x 10  Manual Therapy: STM B  suboccipitals, UT, LS, cerv PS  01/27/2022 - Therapeutic Exercise: to improve strength and mobility.   Bike L1 x 6 min  LTR x 10 to side of preference, cues to stay in Pain free ROM PPT x 10 PPT with hip adduction squeeze x 10  Chin tucks in supine x 10 - cues to not push into pain.  Prone on elbows x 1 min Prone knee bends x 10 bil - cues to not push into pain  Manual Therapy: to decrease muscle spasm and pain and improve mobility Cervical spine in supine: STM/TPR to cervical paraspinals, NAGs into rotation, PA mobs cervical and upper thoracic spine.  Lumbar in prone: IASTM with foam roller to lumbar paraspinals and glutes, PA mobs lumbar spine, MFR to bil QL.   01/19/2022 - Self Care - see patient education.  Therapeutic Exercise: to improve strength and mobility.  Demo, verbal and tactile cues throughout for technique. - Supine Lower Trunk Rotation   10 reps - Supine Posterior Pelvic Tilt  10 reps - Static Prone on Elbows x 1 min - Prone Knee Flexion 10 reps bil   PATIENT EDUCATION:  Education details: HEP update, education on dry needling.   Also discussed weight bearing/resistance exercise for bone health.  Person educated: Patient Education method: Explanation,  Demonstration, Verbal cues, and Handouts Education comprehension: verbalized understanding and returned demonstration   HOME EXERCISE PROGRAM: Access Code: [email or web address] URL: [email or web address] Date: 03/09/2022 Prepared by: Glenetta Hew  Exercises - Supine Lower Trunk Rotation  - 1 x daily - 7 x weekly - 2 sets - 10 reps - Supine Posterior Pelvic Tilt  - 1 x daily - 7 x weekly - 2 sets - 10 reps - Static Prone on Elbows  - 1 x daily - 7 x weekly - Prone Knee Flexion  - 1 x daily - 7 x weekly - 2 sets - 10 reps - Standing Bilateral Low Shoulder Row with Anchored Resistance  - 1 x daily - 7 x weekly - 2 sets - 10 reps - Shoulder Extension with Resistance  - 1 x daily - 7 x weekly - 2 sets - 10 reps - Supine Posterior Pelvic Tilt  - 1 x  daily - 7 x weekly - 1 sets - 10 reps - 5 sec  hold - Supine Bridge  - 1 x daily - 7 x weekly - 3 sets - 10 reps - Bird Dog  - 1 x daily - 7 x weekly - 3 sets - 10 reps - Cat Cow  - 1 x daily - 7 x weekly - 3 sets - 10 reps - Gentle Levator Scapulae Stretch  - 1 x daily - 7 x weekly - 1 sets - 3 reps - 10 sec hold - Seated Gentle Upper Trapezius Stretch  - 1 x daily - 7 x weekly - 1 sets - 3 reps - 10 sec  hold - Seated Shoulder Rolls  - 1 x daily - 7 x weekly - 2 sets - 10 reps - Seated Scapular Retraction  - 1 x daily - 7 x weekly - 2 sets - 10 reps   ASSESSMENT:  CLINICAL IMPRESSION: Becky [name] reports improvement in LBP after previous session, but continues to have neck/R shoulder pain and was very interested in trialing dry needling today.  After explanation of DN rational, procedures, outcomes and potential side effects, patient verbalized consent to DN treatment in conjunction with manual STM/DTM and TPR to reduce ttp/muscle tension. Muscles treated as indicated above. DN produced normal response with good twitches elicited resulting in palpable reduction in pain/ttp and muscle tension, Becky [name] noted immediate decrease in pain and improved  cervical ROM post treatment. She was educated to expect mild to moderate muscle soreness for up to 24-48 hrs and instructed to continue prescribed home exercise program and current activity level with pt verbalizing understanding of theses instructions.    Advanced HEP including stretches of UT and LS, also core exercises performed today and last session.  Becky [name] continues to demonstrate potential for improvement and would benefit from continued skilled therapy to address impairments.       OBJECTIVE IMPAIRMENTS decreased activity tolerance, decreased balance, decreased endurance, decreased ROM, decreased strength, hypomobility, increased fascial restrictions, impaired perceived functional ability, increased muscle spasms, impaired flexibility, impaired UE functional use, improper body mechanics, postural dysfunction, and pain.   ACTIVITY LIMITATIONS carrying, lifting, bending, sleeping, bed mobility, dressing, and reach over head  PARTICIPATION LIMITATIONS: meal prep, cleaning, laundry, driving, shopping, community activity, and yard work  PERSONAL FACTORS 3+ comorbidities: Chronic low back pain, migraines, scoliosis, osteoporosis, RA, IDDM, neuropathy, Sjogren's syndrome  are also affecting patient's functional outcome.   REHAB POTENTIAL: Good  CLINICAL DECISION MAKING: Evolving/moderate complexity  EVALUATION COMPLEXITY: Moderate   GOALS: Goals reviewed with patient? Yes  SHORT TERM GOALS: Target date: 02/09/2022   Patient will be independent with initial HEP.  Baseline: given for back Goal status: IN PROGRESS   LONG TERM GOALS: Target date: 03/16/2022   Patient will be independent with advanced/ongoing HEP to improve outcomes and carryover.  Baseline: needs progression Goal status: IN PROGRESS  2.  Patient will report 75% improvement in neck pain when sleeping/waking to improve QOL.  Baseline: severe pain in mornings.  Goal status: IN PROGRESS  3.  Patient will  demonstrate full pain free cervical ROM for safety with driving.  Baseline: see objective Goal status: IN PROGRESS  4.  Patient will report <17/50 on NDI  to demonstrate improved functional ability. Baseline: 24/50 Goal status: IN PROGRESS  5.   Patient will report <14/50 on modified Oswestry to demonstrate improved QOL Baseline: 20/50 Goal status: IN PROGRESS  6.  Patient will report 75% improvement  in LBP in mornings to improve QOL.    Baseline: 14/10 when wakes Goal status: IN PROGRESS   7. Patient will demonstrate improved posture to decrease muscle imbalance. Baseline: forward head, rounded shoulders Goal status: IN PROGRESS    PLAN: PT FREQUENCY: 2x/week  PT DURATION: 8 weeks  PLANNED INTERVENTIONS: Therapeutic exercises, Therapeutic activity, Neuromuscular re-education, Balance training, Gait training, Patient/Family education, Self Care, Joint mobilization, Dry Needling, Electrical stimulation, Spinal mobilization, Cryotherapy, Moist heat, Traction, Ultrasound, Ionotophoresis 4mg /ml Dexamethasone, Manual therapy, and Re-evaluation  PLAN FOR NEXT SESSION: review and progress core strengthening exercises- neutral spine, postural strengthening exercises, manual therapy, dry needling, modalities PRN.   Add weight bearing/resistance training for osteopenia as appropriate.    Rennie Natter, PT, DPT 03/09/2022, 2:17 PM

## 2022-03-10 DIAGNOSIS — G518 Other disorders of facial nerve: Secondary | ICD-10-CM | POA: Diagnosis not present

## 2022-03-10 DIAGNOSIS — M791 Myalgia, unspecified site: Secondary | ICD-10-CM | POA: Diagnosis not present

## 2022-03-10 DIAGNOSIS — M542 Cervicalgia: Secondary | ICD-10-CM | POA: Diagnosis not present

## 2022-03-10 DIAGNOSIS — G43719 Chronic migraine without aura, intractable, without status migrainosus: Secondary | ICD-10-CM | POA: Diagnosis not present

## 2022-03-11 ENCOUNTER — Ambulatory Visit: Payer: Medicare Other

## 2022-03-11 DIAGNOSIS — R293 Abnormal posture: Secondary | ICD-10-CM | POA: Diagnosis not present

## 2022-03-11 DIAGNOSIS — M25511 Pain in right shoulder: Secondary | ICD-10-CM | POA: Diagnosis not present

## 2022-03-11 DIAGNOSIS — M542 Cervicalgia: Secondary | ICD-10-CM | POA: Diagnosis not present

## 2022-03-11 DIAGNOSIS — M6281 Muscle weakness (generalized): Secondary | ICD-10-CM

## 2022-03-11 DIAGNOSIS — R252 Cramp and spasm: Secondary | ICD-10-CM | POA: Diagnosis not present

## 2022-03-11 DIAGNOSIS — M5441 Lumbago with sciatica, right side: Secondary | ICD-10-CM | POA: Diagnosis not present

## 2022-03-11 DIAGNOSIS — G8929 Other chronic pain: Secondary | ICD-10-CM

## 2022-03-11 NOTE — Therapy (Signed)
OUTPATIENT PHYSICAL THERAPY TREATMENT   Patient Name: Becky [name] MRN: [id number] DOB:[birth year], 66 y.o., female Today's Date: 03/11/2022   PT End of Session - 03/11/22 1402     Visit Number 6    Number of Visits 16    Date for PT Re-Evaluation 03/16/22    Authorization Type [employer]    Progress Note Due on Visit 10    PT Start Time 1316    PT Stop Time 1400    PT Time Calculation (min) 44 min    Activity Tolerance Patient tolerated treatment well    Behavior During Therapy WFL for tasks assessed/performed                Past Medical History:  Diagnosis Date   Abnormal ECG    Anemia    Anxiety    Cataract    . 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   Sjoegren syndrome    Past Surgical History:  Procedure Laterality Date   back ablation  03/2020   back ablation  10/09/2020   BREAST LUMPECTOMY  1995   CARPAL TUNNEL RELEASE  2000   CATARACT EXTRACTION Right 09-05-12   CATARACT EXTRACTION Left 3/15   CESAREAN SECTION  1987   COLONOSCOPY     EYE SURGERY     diabetic retinopathy    HYSTEROSCOPY  02/2001   and D & C   LYMPH NODE DISSECTION  2002   TOE SURGERY  2000   bone removed   Patient Active Problem List   Diagnosis Date Noted   Posterior vitreous detachment of left eye 09/15/2021   Dry eye syndrome, bilateral 09/15/2021   Scoliosis 07/24/2020   Spondylosis without myelopathy or radiculopathy, lumbar region 07/24/2020   Chronic bilateral low back pain without sciatica 07/24/2020   Posterior vitreous detachment  of right eye 03/18/2020   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 determined by examination associated with type 1 diabetes mellitus ([place]) 03/18/2020   Stable treated proliferative diabetic retinopathy of right eye determined by examination associated with type 2 diabetes mellitus ([place]) 03/18/2020   History of vitrectomy 67/20/9470   Complicated UTI (urinary tract infection) 12/13/2018   Failure of outpatient treatment 96/28/3662   Acute metabolic encephalopathy 94/76/5465   Fever 12/10/2018   At high risk for breast cancer 05/09/2018   Eosinophilia 01/31/2017   Iritis 01/04/2017   History of joint swelling 08/24/2016   Osteoarthritis of knees, bilateral 08/24/2016   History of migraine 08/24/2016   History of depression 08/24/2016   History of osteoporosis 08/24/2016   DDD (degenerative disc disease), lumbar 08/24/2016   High risk medication use 08/24/2016   Trigger finger, left middle finger 08/24/2016   Trigger finger, right ring finger 08/24/2016   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   Increased risk of breast cancer 07/06/2015   IDDM (insulin dependent diabetes mellitus) 07/06/2015   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 without organ or systems involvement ([place])  07/06/2015   Sjogren's syndrome ([place]) 07/06/2015   Genetic testing 05/12/2015   Family history of breast cancer 04/15/2015   Fibrocystic breast disease 10/11/2012   GERD 10/01/2009   Chest pain with moderate risk for cardiac etiology 09/02/2009   DYSPHAGIA 09/02/2009   ANXIETY 08/15/2009   ULCER-GASTRIC 08/15/2009   CONSTIPATION 08/15/2009   SLOW TRANSIT CONSTIPATION 08/15/2009   FLATULENCE ERUCTATION AND GAS PAIN 08/15/2009    PCP: Reynold Bowen, MD  REFERRING PROVIDER: Lorine Bears, NP  REFERRING DIAG: [phone] (ICD-10-CM) - Radiculopathy, cervical region M25.511,G89.29 (ICD-10-CM) - Chronic right shoulder pain M54.2 (ICD-10-CM) -  Cervicalgia M79.18 (ICD-10-CM) - Myofascial pain syndrome  THERAPY DIAG:  Cervicalgia  Chronic bilateral low back pain with right-sided sciatica  Chronic right shoulder pain  Abnormal posture  Muscle weakness (generalized)  Cramp and spasm  Rationale for Evaluation and Treatment Rehabilitation  ONSET DATE: several years  SUBJECTIVE:                                                                                                                                                                                                         SUBJECTIVE STATEMENT: Pt reports that the DN from last visit really was a miracle. She lived with pain for years now she does not have pain.   PERTINENT HISTORY:  Chronic low back pain with radiculopathy, myofascial trigger point injections every 6 weeks, back ablation, scoliosis, osteoporosis, RA, Type 1 DM, neuropathy, Sjogren's syndrome  PAIN:  Are you having pain? No  Are you having pain? Yes: NPRS scale: 5/10 Pain location: low back both sides occasionally radiates down R hip.  Pain description: constant, severe,  Aggravating factors: worse in mornings Relieving factors: improves with movement, heat  PRECAUTIONS: None  WEIGHT BEARING RESTRICTIONS No  FALLS:  Has patient fallen in last 6 months? No  feels unsteady due to severe neuropathy in feet.   LIVING ENVIRONMENT: Lives with: lives with their spouse Lives in: House/apartment Stairs: No Has following equipment at home: None  OCCUPATION: retired,  walks 30 min daily  PLOF: Independent  PATIENT GOALS: improve neck and back pain.   OBJECTIVE:   DIAGNOSTIC FINDINGS:  MRI Cervical Spine 01/12/2022  IMPRESSION: 1. C4-C5 mild spinal canal stenosis and mild right neural foraminal narrowing. 2. C6-C7 moderate right and mild left neural foraminal narrowing. 3. C3-C4 mild right neural foraminal narrowing. MRI Lumbar spine 01/12/2022  IMPRESSION: 1. L1-L2 mild right neural  foraminal narrowing. Narrowing of the right lateral recess at this level could affect the descending right L2 nerve roots. 2. Multilevel  facet arthropathy, which can be a cause of back pain.   PATIENT SURVEYS:  Modified Oswestry 20/50= 40% moderate disability  NDI 24/50 = 48% moderate disability    COGNITION: Overall cognitive status: Within functional limits for tasks assessed   SENSATION: Severe neuropathy bil feet.   POSTURE: rounded shoulders and forward head - right shoulder forward more than left.   PALPATION: Hypomobility throughout cervical, upper thoracic, and lumbar spine.  Tenderness R UT, levator, suboccipitals and cervical erector spinae. Elevated R 1st rib, forward R shoulder.  Tenderness L2-3, bil QL (R>L), and lumbar multifidi.   CERVICAL ROM:   Active ROM A/PROM (deg) eval  Flexion 40  Extension 50  Right lateral flexion 20  Left lateral flexion 20  Right rotation 55  Left rotation 60   (Blank rows = not tested)  LUMBAR ROM:   Active  A/PROM  eval  Flexion To shins *  Extension 90% limited *  Right lateral flexion To knees   Left lateral flexion To knees inc pain compared to R  Right rotation WNL  Left rotation WNL*   (Blank rows = not tested)  *pain  UPPER EXTREMITY ROM:  Active ROM Right eval Left eval  Shoulder flexion 130* 135  Shoulder extension 55* 65  Shoulder abduction 115* 115  Shoulder adduction    Shoulder extension    Shoulder internal rotation functional low back * functional midback  Shoulder external rotation FER T1* FER t2   (Blank rows = not tested)  *pain  UPPER EXTREMITY MMT:  MMT Right* eval Left eval  Shoulder flexion 4+* 5  Shoulder extension    Shoulder abduction 5 5  Shoulder adduction 5 5  Shoulder extension    Shoulder internal rotation 5 5  Shoulder external rotation 5 5  Elbow flexion 5 5  Elbow extension 5 5  Wrist flexion 5 5  Wrist extension 5 5  Grip strength good good   (Blank rows = not  tested) *pain  LOWER EXTREMITY MMT:    MMT Right eval Left eval  Hip flexion 5 5  Hip extension    Hip abduction 5 5  Hip adduction 5 5  Hip internal rotation    Hip external rotation    Knee flexion 5 5  Knee extension 5 5  Ankle dorsiflexion 5 5  Ankle plantarflexion     (Blank rows = not tested)    CERVICAL SPECIAL TESTS:  Spurling's test: Negative   FUNCTIONAL TESTS:  5 times sit to stand: 11.64 seconds without UE assist.    TODAY'S TREATMENT:  03/11/22 Therapeutic Exercise: to improve strength and mobility.  Nustep L5x25mn Seated piriformis stretch x 30 sec bil w/ trunk bend - cues to keep neutral spine Seated lumbar flexion stretch x 30 sec orange pball 3 way Seated KTOS piriformis stretch x 30 sec bil Cat/cow x 15 Bird dog x 10 - unsteady on L arm/R leg Standing lumbar extension at wall 10x5" Standing row x 10 green TB Standing extension x 10 green TB  Pallof press green TB  x 10 R/L Manual Therapy: to decrease muscle spasm and pain and improve mobility STM/TPR to lumbar paraspinals; skilled palpation and monitoring during dry needling. Trigger Point Dry-Needling  Treatment instructions: Expect mild to moderate muscle soreness.  Patient verbalized understanding of these instructions and education.  Patient Consent Given: Yes Education handout provided: Previously provided Muscles treated: bil L3/4, L4/5 lumbar multifidi Treatment response/outcome: Twitch Response Elicited and Palpable Increase in Muscle Length  03/09/2022  Therapeutic Exercise: to improve strength and mobility.  Demo, verbal and tactile cues throughout for technique. Bike L2 x 8 min  Cat/cows x 15  Quadruped leg extensions x 10 Bird dogs x 10 - CGA for safety After manual therapy- Cervical AROM Levator stretch Upper trap stretch Retro shoulder circles Review and progression HEP Manual Therapy: to decrease muscle spasm and pain and improve mobility STM/TPR to R upper trap, cervical  paraspinals, levator scapulae; skilled palpation and monitoring during dry needling. Trigger Point Dry-Needling  Treatment instructions: Expect mild to moderate muscle soreness. S/S of pneumothorax if dry needled over a lung field, and to seek immediate medical attention should they occur. Patient verbalized understanding of these instructions and education.  Patient Consent Given: Yes Education handout provided: Yes Muscles treated: R cervical multifidi C4-6, R UT, R levator Treatment response/outcome: Twitch Response Elicited and Palpable Increase in Muscle Length   02/09/22 TherEx:   SKTC 5x5 second holds  Lumbar rotations 5x5 second holds DKTC 5x5 second holds  HS and piriformis stretches 2x30 seconds B  Hip flexor stretches 2x30 seconds B  Bridges x15  Walking bridges 1x10  Sidelying hip abduction x10  Cat cow x15  Quadruped TrA sets 1x15 3 second holds            Quadruped TrA sets with alternating UE raise 1x10            Hip hikes 2x10 with and without swing   02/03/22 Therapeutic Exercise: Nustep L3x51mn Standing shoulder squeeze against doorframe for tactile feedback x 10 Wall push ups x 10 Standing row w/ red TB x 10 - cues not to shrug shoulders Standing extension w/ red TB x 10 POE x 1 min Prone hip extension x 10 Cat/cow in quadruped x 10  Manual Therapy: STM B  suboccipitals, UT, LS, cerv PS  01/27/2022 - Therapeutic Exercise: to improve strength and mobility.   Bike L1 x 6 min  LTR x 10 to side of preference, cues to stay in Pain free ROM PPT x 10 PPT with hip adduction squeeze x 10  Chin tucks in supine x 10 - cues to not push into pain.  Prone on elbows x 1 min Prone knee bends x 10 bil - cues to not push into pain  Manual Therapy: to decrease muscle spasm and pain and improve mobility Cervical spine in supine: STM/TPR to cervical paraspinals, NAGs into rotation, PA mobs cervical and upper thoracic spine.  Lumbar in prone: IASTM with foam roller to  lumbar paraspinals and glutes, PA mobs lumbar spine, MFR to bil QL.   01/19/2022 - Self Care - see patient education.  Therapeutic Exercise: to improve strength and mobility.  Demo, verbal and tactile cues throughout for technique. - Supine Lower Trunk Rotation   10 reps - Supine Posterior Pelvic Tilt  10 reps - Static Prone on Elbows x 1 min - Prone Knee Flexion 10 reps bil   PATIENT EDUCATION:  Education details: HEP update, education on dry needling.   Also discussed weight bearing/resistance exercise for bone health.  Person educated: Patient Education method: Explanation, Demonstration, Verbal cues, and Handouts Education comprehension: verbalized understanding and returned demonstration   HOME EXERCISE PROGRAM: Access Code: [email or web address] URL: [email or web address] Date: 03/09/2022 Prepared by: EGlenetta Hew Exercises - Supine Lower Trunk Rotation  - 1 x daily - 7 x weekly - 2 sets - 10 reps - Supine Posterior Pelvic Tilt  - 1 x daily - 7 x weekly -  2 sets - 10 reps - Static Prone on Elbows  - 1 x daily - 7 x weekly - Prone Knee Flexion  - 1 x daily - 7 x weekly - 2 sets - 10 reps - Standing Bilateral Low Shoulder Row with Anchored Resistance  - 1 x daily - 7 x weekly - 2 sets - 10 reps - Shoulder Extension with Resistance  - 1 x daily - 7 x weekly - 2 sets - 10 reps - Supine Posterior Pelvic Tilt  - 1 x daily - 7 x weekly - 1 sets - 10 reps - 5 sec  hold - Supine Bridge  - 1 x daily - 7 x weekly - 3 sets - 10 reps - Bird Dog  - 1 x daily - 7 x weekly - 3 sets - 10 reps - Cat Cow  - 1 x daily - 7 x weekly - 3 sets - 10 reps - Gentle Levator Scapulae Stretch  - 1 x daily - 7 x weekly - 1 sets - 3 reps - 10 sec hold - Seated Gentle Upper Trapezius Stretch  - 1 x daily - 7 x weekly - 1 sets - 3 reps - 10 sec  hold - Seated Shoulder Rolls  - 1 x daily - 7 x weekly - 2 sets - 10 reps - Seated Scapular Retraction  - 1 x daily - 7 x weekly - 2 sets - 10  reps   ASSESSMENT:  CLINICAL IMPRESSION: Pt reports great improvement from DN to upper neck and shoulders. She expressed interested in DN for low back this session. She responded well to the treatment today. Shows difficulty with bird dog exercise when supported on L arm/R leg. Required clarification with rows and ext for full engagement of shoulder blades. At end of session PT performed manual therapy and TrDN to lumbar multifidi, tolerated well and reported immediate decrease in LBP.  [name] continues to demonstrate potential for improvement and would benefit from continued skilled therapy to address impairments.      OBJECTIVE IMPAIRMENTS decreased activity tolerance, decreased balance, decreased endurance, decreased ROM, decreased strength, hypomobility, increased fascial restrictions, impaired perceived functional ability, increased muscle spasms, impaired flexibility, impaired UE functional use, improper body mechanics, postural dysfunction, and pain.   ACTIVITY LIMITATIONS carrying, lifting, bending, sleeping, bed mobility, dressing, and reach over head  PARTICIPATION LIMITATIONS: meal prep, cleaning, laundry, driving, shopping, community activity, and yard work  PERSONAL FACTORS 3+ comorbidities: Chronic low back pain, migraines, scoliosis, osteoporosis, RA, IDDM, neuropathy, Sjogren's syndrome  are also affecting patient's functional outcome.   REHAB POTENTIAL: Good  CLINICAL DECISION MAKING: Evolving/moderate complexity  EVALUATION COMPLEXITY: Moderate   GOALS: Goals reviewed with patient? Yes  SHORT TERM GOALS: Target date: 02/09/2022   Patient will be independent with initial HEP.  Baseline: given for back Goal status: MET   LONG TERM GOALS: Target date: 03/16/2022   Patient will be independent with advanced/ongoing HEP to improve outcomes and carryover.  Baseline: needs progression Goal status: IN PROGRESS  2.  Patient will report 75% improvement in neck  pain when sleeping/waking to improve QOL.  Baseline: severe pain in mornings.  Goal status: IN PROGRESS  3.  Patient will demonstrate full pain free cervical ROM for safety with driving.  Baseline: see objective Goal status: IN PROGRESS  4.  Patient will report <17/50 on NDI  to demonstrate improved functional ability. Baseline: 24/50 Goal status: IN PROGRESS  5.   Patient will report <  14/50 on modified Oswestry to demonstrate improved QOL Baseline: 20/50 Goal status: IN PROGRESS  6.  Patient will report 75% improvement in LBP in mornings to improve QOL.    Baseline: 14/10 when wakes Goal status: IN PROGRESS   7. Patient will demonstrate improved posture to decrease muscle imbalance. Baseline: forward head, rounded shoulders Goal status: IN PROGRESS    PLAN: PT FREQUENCY: 2x/week  PT DURATION: 8 weeks  PLANNED INTERVENTIONS: Therapeutic exercises, Therapeutic activity, Neuromuscular re-education, Balance training, Gait training, Patient/Family education, Self Care, Joint mobilization, Dry Needling, Electrical stimulation, Spinal mobilization, Cryotherapy, Moist heat, Traction, Ultrasound, Ionotophoresis 35m/ml Dexamethasone, Manual therapy, and Re-evaluation  PLAN FOR NEXT SESSION: review and progress core strengthening exercises- neutral spine, postural strengthening exercises, manual therapy, dry needling, modalities PRN.   Add weight bearing/resistance training for osteopenia as appropriate.    BArtist Pais PTA 03/11/2022, 2:02 PM   ERennie Natter PT, DPT 03/11/2022 2:32 PM

## 2022-03-16 ENCOUNTER — Encounter: Payer: Self-pay | Admitting: Physical Therapy

## 2022-03-16 ENCOUNTER — Ambulatory Visit: Payer: Medicare Other | Admitting: Physical Therapy

## 2022-03-16 DIAGNOSIS — M6281 Muscle weakness (generalized): Secondary | ICD-10-CM

## 2022-03-16 DIAGNOSIS — M542 Cervicalgia: Secondary | ICD-10-CM | POA: Diagnosis not present

## 2022-03-16 DIAGNOSIS — G8929 Other chronic pain: Secondary | ICD-10-CM

## 2022-03-16 DIAGNOSIS — R252 Cramp and spasm: Secondary | ICD-10-CM

## 2022-03-16 DIAGNOSIS — M25511 Pain in right shoulder: Secondary | ICD-10-CM | POA: Diagnosis not present

## 2022-03-16 DIAGNOSIS — R293 Abnormal posture: Secondary | ICD-10-CM

## 2022-03-16 DIAGNOSIS — M5441 Lumbago with sciatica, right side: Secondary | ICD-10-CM | POA: Diagnosis not present

## 2022-03-16 NOTE — Therapy (Signed)
OUTPATIENT PHYSICAL THERAPY TREATMENT Progress Note Reporting Period 01/19/2022 to 03/16/2022  See note below for Objective Data and Assessment of Progress/Goals.      Patient Name: Becky [name] MRN: [id number] DOB:23-Feb-1956, 66 y.o., female Today's Date: 03/16/2022   PT End of Session - 03/16/22 1401     Visit Number 7    Number of Visits 16    Date for PT Re-Evaluation 04/27/22    Authorization Type [employer]    Progress Note Due on Visit 10    PT Start Time 1315    PT Stop Time 1402    PT Time Calculation (min) 47 min    Activity Tolerance Patient tolerated treatment well    Behavior During Therapy WFL for tasks assessed/performed                Past Medical History:  Diagnosis Date   Abnormal ECG    Anemia    Anxiety    Cataract    . Cateracts bil eyes removed   GERD (gastroesophageal reflux disease)    Heart murmur    History of COVID-19 06/14/2020   Hypercholesterolemia    IDDM (insulin dependent diabetes mellitus) 1969   Migraine headache    Neuromuscular disorder (HCC)    bil legs & feet - neuropathy   Neuropathy    Osteoporosis    10/2012   Otosclerosis    Pigmented purpura ([place])    RA (rheumatoid arthritis) ([place]) 2000   S/P cardiac cath 01/2012   Normal coronaries and normal LV function.   Sjoegren syndrome    Past Surgical History:  Procedure Laterality Date   back ablation  03/2020   back ablation  10/09/2020   BREAST LUMPECTOMY  1995   CARPAL TUNNEL RELEASE  2000   CATARACT EXTRACTION Right 09-05-12   CATARACT EXTRACTION Left 3/15   CESAREAN SECTION  1987   COLONOSCOPY     EYE SURGERY     diabetic retinopathy    HYSTEROSCOPY  02/2001   and D & C   LYMPH NODE DISSECTION  2002   TOE SURGERY  2000   bone removed   Patient Active Problem List   Diagnosis Date Noted   Posterior vitreous detachment of left eye 09/15/2021   Dry eye syndrome, bilateral 09/15/2021   Scoliosis 07/24/2020   Spondylosis without myelopathy or  radiculopathy, lumbar region 07/24/2020   Chronic bilateral low back pain without sciatica 07/24/2020   Posterior vitreous detachment of right eye 03/18/2020   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 determined by examination associated with type 1 diabetes mellitus ([place]) 03/18/2020   Stable treated proliferative diabetic retinopathy of right eye determined by examination associated with type 2 diabetes mellitus ([place]) 03/18/2020   History of vitrectomy 54/62/7035   Complicated UTI (urinary tract infection) 12/13/2018   Failure of outpatient treatment 00/93/8182   Acute metabolic encephalopathy 99/37/1696   Fever 12/10/2018   At high risk for breast cancer 05/09/2018   Eosinophilia 01/31/2017   Iritis 01/04/2017   History of joint swelling 08/24/2016   Osteoarthritis of knees, bilateral 08/24/2016   History of migraine 08/24/2016   History of depression 08/24/2016   History of osteoporosis 08/24/2016   DDD (degenerative disc disease), lumbar 08/24/2016   High risk medication use 08/24/2016   Trigger finger, left middle finger 08/24/2016   Trigger finger, right ring finger 08/24/2016   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   Increased risk of breast cancer 07/06/2015   IDDM (  insulin dependent diabetes mellitus) 07/06/2015   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 without organ or systems involvement ([place]) 07/06/2015   Sjogren's syndrome ([place]) 07/06/2015   Genetic testing 05/12/2015   Family history of breast cancer 04/15/2015   Fibrocystic breast disease 10/11/2012   GERD 10/01/2009   Chest pain with moderate risk for cardiac etiology 09/02/2009   DYSPHAGIA 09/02/2009   ANXIETY 08/15/2009   ULCER-GASTRIC 08/15/2009   CONSTIPATION 08/15/2009   SLOW TRANSIT CONSTIPATION 08/15/2009   FLATULENCE ERUCTATION AND GAS PAIN 08/15/2009    PCP: Reynold Bowen, MD  REFERRING PROVIDER: Lorine Bears, NP  REFERRING DIAG:  [phone] (ICD-10-CM) - Radiculopathy, cervical region M25.511,G89.29 (ICD-10-CM) - Chronic right shoulder pain M54.2 (ICD-10-CM) - Cervicalgia M79.18 (ICD-10-CM) - Myofascial pain syndrome  THERAPY DIAG:  Cervicalgia  Chronic bilateral low back pain with right-sided sciatica  Chronic right shoulder pain  Abnormal posture  Muscle weakness (generalized)  Cramp and spasm  Rationale for Evaluation and Treatment Rehabilitation  ONSET DATE: several years  SUBJECTIVE:                                                                                                                                                                                                         SUBJECTIVE STATEMENT: Pt reports that her neck is still feeling a lot better still after DN, did not notice as much of a difference after DN to her back.  Would like to continue PT.     PERTINENT HISTORY:  Chronic low back pain with radiculopathy, myofascial trigger point injections every 6 weeks, back ablation, scoliosis, osteoporosis, RA, Type 1 DM, neuropathy, Sjogren's syndrome  PAIN:  Are you having pain? No  Are you having pain? Yes: NPRS scale: 8/10 Pain location: low back  Pain description: constant, severe,  Aggravating factors: worse in mornings Relieving factors: improves with movement, heat  PRECAUTIONS: None  WEIGHT BEARING RESTRICTIONS No  FALLS:  Has patient fallen in last 6 months? No  feels unsteady due to severe neuropathy in feet.   LIVING ENVIRONMENT: Lives with: lives with their spouse Lives in: House/apartment Stairs: No Has following equipment at home: None  OCCUPATION: retired,  walks 30 min daily  PLOF: Independent  PATIENT GOALS: improve neck and back pain.   OBJECTIVE:   DIAGNOSTIC FINDINGS:  MRI Cervical Spine 01/12/2022  IMPRESSION: 1. C4-C5 mild spinal canal stenosis and mild right neural foraminal narrowing. 2. C6-C7 moderate right and mild left neural foraminal  narrowing. 3. C3-C4 mild right neural foraminal narrowing. MRI Lumbar spine 01/12/2022  IMPRESSION: 1.  L1-L2 mild right neural foraminal narrowing. Narrowing of the right lateral recess at this level could affect the descending right L2 nerve roots. 2. Multilevel facet arthropathy, which can be a cause of back pain.   PATIENT SURVEYS:  Modified Oswestry 20/50= 40% moderate disability  NDI 24/50 = 48% moderate disability    COGNITION: Overall cognitive status: Within functional limits for tasks assessed   SENSATION: Severe neuropathy bil feet.   POSTURE: rounded shoulders and forward head - right shoulder forward more than left.   PALPATION: Hypomobility throughout cervical, upper thoracic, and lumbar spine.  Tenderness R UT, levator, suboccipitals and cervical erector spinae. Elevated R 1st rib, forward R shoulder.  Tenderness L2-3, bil QL (R>L), and lumbar multifidi.   CERVICAL ROM:   Active ROM A/PROM (deg) eval  Flexion 40  Extension 50  Right lateral flexion 20  Left lateral flexion 20  Right rotation 55  Left rotation 60   (Blank rows = not tested)  LUMBAR ROM:   Active  A/PROM  eval  Flexion To shins *  Extension 90% limited *  Right lateral flexion To knees   Left lateral flexion To knees inc pain compared to R  Right rotation WNL  Left rotation WNL*   (Blank rows = not tested)  *pain  UPPER EXTREMITY ROM:  Active ROM Right eval Left eval  Shoulder flexion 130* 135  Shoulder extension 55* 65  Shoulder abduction 115* 115  Shoulder adduction    Shoulder extension    Shoulder internal rotation functional low back * functional midback  Shoulder external rotation FER T1* FER t2   (Blank rows = not tested)  *pain  UPPER EXTREMITY MMT:  MMT Right* eval Left eval  Shoulder flexion 4+* 5  Shoulder extension    Shoulder abduction 5 5  Shoulder adduction 5 5  Shoulder extension    Shoulder internal rotation 5 5  Shoulder external rotation 5 5   Elbow flexion 5 5  Elbow extension 5 5  Wrist flexion 5 5  Wrist extension 5 5  Grip strength good good   (Blank rows = not tested) *pain  LOWER EXTREMITY MMT:    MMT Right eval Left eval  Hip flexion 5 5  Hip extension    Hip abduction 5 5  Hip adduction 5 5  Hip internal rotation    Hip external rotation    Knee flexion 5 5  Knee extension 5 5  Ankle dorsiflexion 5 5  Ankle plantarflexion     (Blank rows = not tested)    CERVICAL SPECIAL TESTS:  Spurling's test: Negative   FUNCTIONAL TESTS:  5 times sit to stand: 11.64 seconds without UE assist.    TODAY'S TREATMENT:  03/16/2022 Therapeutic Exercise: to improve strength and mobility.  Demo, verbal and tactile cues throughout for technique. Nustep L4 x 6 min  Bridges x 10  SL bridges x 10 bil  Bridges with leg extensions x 10 bil alternating legs Bird dogs x 10  Manual Therapy: to decrease muscle spasm and pain and improve mobility STM/TPR to lumbar paraspinals, R piriformis glut, R QL; skilled palpation and monitoring during dry needling. Trigger Point Dry-Needling  Treatment instructions: Expect mild to moderate muscle soreness. S/S of pneumothorax if dry needled over a lung field, and to seek immediate medical attention should they occur. Patient verbalized understanding of these instructions and education.  Patient Consent Given: Yes Education handout provided: Previously provided Muscles treated: bil L3/4,4/5 multifidi, R glut med and  piriformis, R QL Treatment response/outcome: Twitch Response Elicited and Palpable Increase in Muscle Length   03/11/22 Therapeutic Exercise: to improve strength and mobility.  Nustep L5x75mn Seated piriformis stretch x 30 sec bil w/ trunk bend - cues to keep neutral spine Seated lumbar flexion stretch x 30 sec orange pball 3 way Seated KTOS piriformis stretch x 30 sec bil Cat/cow x 15 Bird dog x 10 - unsteady on L arm/R leg Standing lumbar extension at wall  10x5" Standing row x 10 green TB Standing extension x 10 green TB  Pallof press green TB  x 10 R/L Manual Therapy: to decrease muscle spasm and pain and improve mobility STM/TPR to lumbar paraspinals; skilled palpation and monitoring during dry needling. Trigger Point Dry-Needling  Treatment instructions: Expect mild to moderate muscle soreness.  Patient verbalized understanding of these instructions and education.  Patient Consent Given: Yes Education handout provided: Previously provided Muscles treated: bil L3/4, L4/5 lumbar multifidi Treatment response/outcome: Twitch Response Elicited and Palpable Increase in Muscle Length   03/09/2022  Therapeutic Exercise: to improve strength and mobility.  Demo, verbal and tactile cues throughout for technique. Bike L2 x 8 min  Cat/cows x 15  Quadruped leg extensions x 10 Bird dogs x 10 - CGA for safety After manual therapy- Cervical AROM Levator stretch Upper trap stretch Retro shoulder circles Review and progression HEP Manual Therapy: to decrease muscle spasm and pain and improve mobility STM/TPR to R upper trap, cervical paraspinals, levator scapulae; skilled palpation and monitoring during dry needling. Trigger Point Dry-Needling  Treatment instructions: Expect mild to moderate muscle soreness. S/S of pneumothorax if dry needled over a lung field, and to seek immediate medical attention should they occur. Patient verbalized understanding of these instructions and education.  Patient Consent Given: Yes Education handout provided: Yes Muscles treated: R cervical multifidi C4-6, R UT, R levator Treatment response/outcome: Twitch Response Elicited and Palpable Increase in Muscle Length   02/09/22 TherEx:   SKTC 5x5 second holds  Lumbar rotations 5x5 second holds DKTC 5x5 second holds  HS and piriformis stretches 2x30 seconds B  Hip flexor stretches 2x30 seconds B  Bridges x15  Walking bridges 1x10  Sidelying hip abduction x10   Cat cow x15  Quadruped TrA sets 1x15 3 second holds            Quadruped TrA sets with alternating UE raise 1x10            Hip hikes 2x10 with and without swing   02/03/22 Therapeutic Exercise: Nustep L3x637m Standing shoulder squeeze against doorframe for tactile feedback x 10 Wall push ups x 10 Standing row w/ red TB x 10 - cues not to shrug shoulders Standing extension w/ red TB x 10 POE x 1 min Prone hip extension x 10 Cat/cow in quadruped x 10  Manual Therapy: STM B  suboccipitals, UT, LS, cerv PS  01/27/2022 - Therapeutic Exercise: to improve strength and mobility.   Bike L1 x 6 min  LTR x 10 to side of preference, cues to stay in Pain free ROM PPT x 10 PPT with hip adduction squeeze x 10  Chin tucks in supine x 10 - cues to not push into pain.  Prone on elbows x 1 min Prone knee bends x 10 bil - cues to not push into pain  Manual Therapy: to decrease muscle spasm and pain and improve mobility Cervical spine in supine: STM/TPR to cervical paraspinals, NAGs into rotation, PA mobs cervical and upper thoracic spine.  Lumbar in prone: IASTM with foam roller to lumbar paraspinals and glutes, PA mobs lumbar spine, MFR to bil QL.   01/19/2022 - Self Care - see patient education.  Therapeutic Exercise: to improve strength and mobility.  Demo, verbal and tactile cues throughout for technique. - Supine Lower Trunk Rotation   10 reps - Supine Posterior Pelvic Tilt  10 reps - Static Prone on Elbows x 1 min - Prone Knee Flexion 10 reps bil   PATIENT EDUCATION:  Education details: continue HEP  Person educated: Patient Education method: Explanation, Demonstration, Verbal cues, and Handouts Education comprehension: verbalized understanding and returned demonstration   HOME EXERCISE PROGRAM: Access Code: [email or web address] URL: [email or web address] Date: 03/09/2022 Prepared by: Glenetta Hew  Exercises - Supine Lower Trunk Rotation  - 1 x daily - 7 x weekly - 2 sets  - 10 reps - Supine Posterior Pelvic Tilt  - 1 x daily - 7 x weekly - 2 sets - 10 reps - Static Prone on Elbows  - 1 x daily - 7 x weekly - Prone Knee Flexion  - 1 x daily - 7 x weekly - 2 sets - 10 reps - Standing Bilateral Low Shoulder Row with Anchored Resistance  - 1 x daily - 7 x weekly - 2 sets - 10 reps - Shoulder Extension with Resistance  - 1 x daily - 7 x weekly - 2 sets - 10 reps - Supine Posterior Pelvic Tilt  - 1 x daily - 7 x weekly - 1 sets - 10 reps - 5 sec  hold - Supine Bridge  - 1 x daily - 7 x weekly - 3 sets - 10 reps - Bird Dog  - 1 x daily - 7 x weekly - 3 sets - 10 reps - Cat Cow  - 1 x daily - 7 x weekly - 3 sets - 10 reps - Gentle Levator Scapulae Stretch  - 1 x daily - 7 x weekly - 1 sets - 3 reps - 10 sec hold - Seated Gentle Upper Trapezius Stretch  - 1 x daily - 7 x weekly - 1 sets - 3 reps - 10 sec  hold - Seated Shoulder Rolls  - 1 x daily - 7 x weekly - 2 sets - 10 reps - Seated Scapular Retraction  - 1 x daily - 7 x weekly - 2 sets - 10 reps   ASSESSMENT:  CLINICAL IMPRESSION: Becky [name] is making good progress, reporting significant improvement (80%) in neck pain meeting LTG #2, and improvement in low back pain as well.  She reports good compliance with HEP, for the first time noting that exercise makes her feel better.  Tolerated progression of exercises without increased pain, still unsteady with bird-dogs.  After manual therapy and DN to low back, she reported significant decrease in LBP, 0/10 after interventions.  Becky [name] continues to demonstrate potential for improvement and would benefit from continued skilled therapy to address impairments.  She would benefit from extension of skilled therapy 1-2x/week for additional 6 weeks, as she missed several weeks of therapy due to illness.    OBJECTIVE IMPAIRMENTS decreased activity tolerance, decreased balance, decreased endurance, decreased ROM, decreased strength, hypomobility, increased fascial  restrictions, impaired perceived functional ability, increased muscle spasms, impaired flexibility, impaired UE functional use, improper body mechanics, postural dysfunction, and pain.   ACTIVITY LIMITATIONS carrying, lifting, bending, sleeping, bed mobility, dressing, and reach over head  PARTICIPATION LIMITATIONS: meal prep, cleaning, laundry, driving, shopping, community activity,  and yard work  PERSONAL FACTORS 3+ comorbidities: Chronic low back pain, migraines, scoliosis, osteoporosis, RA, IDDM, neuropathy, Sjogren's syndrome  are also affecting patient's functional outcome.   REHAB POTENTIAL: Good  CLINICAL DECISION MAKING: Evolving/moderate complexity  EVALUATION COMPLEXITY: Moderate   GOALS: Goals reviewed with patient? Yes  SHORT TERM GOALS: Target date: 02/09/2022   Patient will be independent with initial HEP.  Baseline: given for back Goal status: MET   LONG TERM GOALS: Target date: 03/16/2022 extended to 04/27/2022  Patient will be independent with advanced/ongoing HEP to improve outcomes and carryover.  Baseline: needs progression Goal status: IN PROGRESS  2.  Patient will report 75% improvement in neck pain when sleeping/waking to improve QOL.  Baseline: severe pain in mornings.  Goal status: MET  100% improvement, no pain in neck   3.  Patient will demonstrate full pain free cervical ROM for safety with driving.  Baseline: see objective Goal status: IN PROGRESS  4.  Patient will report <17/50 on NDI  to demonstrate improved functional ability. Baseline: 24/50 Goal status: IN PROGRESS  5.   Patient will report <14/50 on modified Oswestry to demonstrate improved QOL Baseline: 20/50 Goal status: IN PROGRESS  6.  Patient will report 75% improvement in LBP in mornings to improve QOL.    Baseline: 14/10 when wakes Goal status: IN PROGRESS  03/16/22- 10% improvement, not as severe in AM  7. Patient will demonstrate improved posture to decrease muscle  imbalance. Baseline: forward head, rounded shoulders Goal status: IN PROGRESS  03/16/22- improving, less cues needed.     PLAN: PT FREQUENCY: 2x/week  PT DURATION: 8 weeks extended to 04/27/2022  PLANNED INTERVENTIONS: Therapeutic exercises, Therapeutic activity, Neuromuscular re-education, Balance training, Gait training, Patient/Family education, Self Care, Joint mobilization, Dry Needling, Electrical stimulation, Spinal mobilization, Cryotherapy, Moist heat, Traction, Ultrasound, Ionotophoresis 56m/ml Dexamethasone, Manual therapy, and Re-evaluation  PLAN FOR NEXT SESSION: check cervical ROM, review and progress core strengthening exercises, manual therapy, dry needling, modalities PRN.   Add weight bearing/resistance training for osteopenia as appropriate.    ERennie Natter PT, DPT 03/16/2022, 6:10 PM

## 2022-03-30 ENCOUNTER — Ambulatory Visit: Payer: Medicare Other | Attending: Physical Medicine and Rehabilitation | Admitting: Physical Therapy

## 2022-03-30 ENCOUNTER — Encounter: Payer: Self-pay | Admitting: Physical Therapy

## 2022-03-30 DIAGNOSIS — M25511 Pain in right shoulder: Secondary | ICD-10-CM | POA: Diagnosis not present

## 2022-03-30 DIAGNOSIS — M5441 Lumbago with sciatica, right side: Secondary | ICD-10-CM | POA: Diagnosis not present

## 2022-03-30 DIAGNOSIS — M542 Cervicalgia: Secondary | ICD-10-CM | POA: Insufficient documentation

## 2022-03-30 DIAGNOSIS — G8929 Other chronic pain: Secondary | ICD-10-CM | POA: Insufficient documentation

## 2022-03-30 DIAGNOSIS — R252 Cramp and spasm: Secondary | ICD-10-CM | POA: Diagnosis not present

## 2022-03-30 DIAGNOSIS — M6281 Muscle weakness (generalized): Secondary | ICD-10-CM | POA: Insufficient documentation

## 2022-03-30 DIAGNOSIS — R293 Abnormal posture: Secondary | ICD-10-CM | POA: Diagnosis not present

## 2022-03-30 NOTE — Therapy (Signed)
OUTPATIENT PHYSICAL THERAPY TREATMENT   Patient Name: Becky [name] MRN: [id number] DOB:03-24-1956, 66 y.o., female Today's Date: 03/30/2022   PT End of Session - 03/30/22 1406     Visit Number 8    Number of Visits 16    Date for PT Re-Evaluation 04/27/22    Authorization Type [employer]    Progress Note Due on Visit 16    PT Start Time 1405    PT Stop Time 1445    PT Time Calculation (min) 40 min    Activity Tolerance Patient tolerated treatment well    Behavior During Therapy WFL for tasks assessed/performed                Past Medical History:  Diagnosis Date   Abnormal ECG    Anemia    Anxiety    Cataract    . Cateracts bil eyes removed   GERD (gastroesophageal reflux disease)    Heart murmur    History of COVID-19 06/14/2020   Hypercholesterolemia    IDDM (insulin dependent diabetes mellitus) 1969   Migraine headache    Neuromuscular disorder (HCC)    bil legs & feet - neuropathy   Neuropathy    Osteoporosis    10/2012   Otosclerosis    Pigmented purpura ([place])    RA (rheumatoid arthritis) ([place]) 2000   S/P cardiac cath 01/2012   Normal coronaries and normal LV function.   Sjoegren syndrome    Past Surgical History:  Procedure Laterality Date   back ablation  03/2020   back ablation  10/09/2020   BREAST LUMPECTOMY  1995   CARPAL TUNNEL RELEASE  2000   CATARACT EXTRACTION Right 09-05-12   CATARACT EXTRACTION Left 3/15   CESAREAN SECTION  1987   COLONOSCOPY     EYE SURGERY     diabetic retinopathy    HYSTEROSCOPY  02/2001   and D & C   LYMPH NODE DISSECTION  2002   TOE SURGERY  2000   bone removed   Patient Active Problem List   Diagnosis Date Noted   Posterior vitreous detachment of left eye 09/15/2021   Dry eye syndrome, bilateral 09/15/2021   Scoliosis 07/24/2020   Spondylosis without myelopathy or radiculopathy, lumbar region 07/24/2020   Chronic bilateral low back pain without sciatica 07/24/2020   Posterior vitreous  detachment of right eye 03/18/2020   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 determined by examination associated with type 1 diabetes mellitus ([place]) 03/18/2020   Stable treated proliferative diabetic retinopathy of right eye determined by examination associated with type 2 diabetes mellitus ([place]) 03/18/2020   History of vitrectomy 30/10/1100   Complicated UTI (urinary tract infection) 12/13/2018   Failure of outpatient treatment 05/07/3566   Acute metabolic encephalopathy 01/41/0301   Fever 12/10/2018   At high risk for breast cancer 05/09/2018   Eosinophilia 01/31/2017   Iritis 01/04/2017   History of joint swelling 08/24/2016   Osteoarthritis of knees, bilateral 08/24/2016   History of migraine 08/24/2016   History of depression 08/24/2016   History of osteoporosis 08/24/2016   DDD (degenerative disc disease), lumbar 08/24/2016   High risk medication use 08/24/2016   Trigger finger, left middle finger 08/24/2016   Trigger finger, right ring finger 08/24/2016   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   Increased risk of breast cancer 07/06/2015   IDDM (insulin dependent diabetes mellitus) 07/06/2015   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 without organ or systems involvement ([place])  07/06/2015   Sjogren's syndrome ([place]) 07/06/2015   Genetic testing 05/12/2015   Family history of breast cancer 04/15/2015   Fibrocystic breast disease 10/11/2012   GERD 10/01/2009   Chest pain with moderate risk for cardiac etiology 09/02/2009   DYSPHAGIA 09/02/2009   ANXIETY 08/15/2009   ULCER-GASTRIC 08/15/2009   CONSTIPATION 08/15/2009   SLOW TRANSIT CONSTIPATION 08/15/2009   FLATULENCE ERUCTATION AND GAS PAIN 08/15/2009    PCP: Reynold Bowen, MD  REFERRING PROVIDER: Lorine Bears, NP  REFERRING DIAG: [phone] (ICD-10-CM) - Radiculopathy, cervical region M25.511,G89.29 (ICD-10-CM) - Chronic right shoulder pain M54.2 (ICD-10-CM) -  Cervicalgia M79.18 (ICD-10-CM) - Myofascial pain syndrome  THERAPY DIAG:  Cervicalgia  Chronic bilateral low back pain with right-sided sciatica  Chronic right shoulder pain  Abnormal posture  Muscle weakness (generalized)  Cramp and spasm  Rationale for Evaluation and Treatment Rehabilitation  ONSET DATE: several years  SUBJECTIVE:                                                                                                                                                                                                         SUBJECTIVE STATEMENT: Gabi T Raffel reports dry needling in back wasn't as dramatic as her neck, but did help significantly, not screaming in pain when wakes up in morning.   Very compliant with all her exercises.     PERTINENT HISTORY:  Chronic low back pain with radiculopathy, myofascial trigger point injections every 6 weeks, back ablation, scoliosis, osteoporosis, RA, Type 1 DM, neuropathy, Sjogren's syndrome  PAIN:  Are you having pain? Yes: NPRS scale: 2/10 Pain location: neck Pain description: starting to come back  Are you having pain? Yes: NPRS scale: 4/10 Pain location: low back  Pain description: constant  Aggravating factors: worse in mornings Relieving factors: improves with movement, heat  PRECAUTIONS: None  WEIGHT BEARING RESTRICTIONS No  FALLS:  Has patient fallen in last 6 months? No  feels unsteady due to severe neuropathy in feet.   LIVING ENVIRONMENT: Lives with: lives with their spouse Lives in: House/apartment Stairs: No Has following equipment at home: None  OCCUPATION: retired,  walks 30 min daily  PLOF: Independent  PATIENT GOALS: improve neck and back pain.   OBJECTIVE:   DIAGNOSTIC FINDINGS:  MRI Cervical Spine 01/12/2022  IMPRESSION: 1. C4-C5 mild spinal canal stenosis and mild right neural foraminal narrowing. 2. C6-C7 moderate right and mild left neural foraminal narrowing. 3. C3-C4 mild right  neural foraminal narrowing. MRI Lumbar spine 01/12/2022  IMPRESSION: 1. L1-L2 mild right neural foraminal narrowing. Narrowing of  the right lateral recess at this level could affect the descending right L2 nerve roots. 2. Multilevel facet arthropathy, which can be a cause of back pain.   PATIENT SURVEYS:  Modified Oswestry 20/50= 40% moderate disability  NDI 24/50 = 48% moderate disability    COGNITION: Overall cognitive status: Within functional limits for tasks assessed   SENSATION: Severe neuropathy bil feet.   POSTURE: rounded shoulders and forward head - right shoulder forward more than left.   PALPATION: Hypomobility throughout cervical, upper thoracic, and lumbar spine.  Tenderness R UT, levator, suboccipitals and cervical erector spinae. Elevated R 1st rib, forward R shoulder.  Tenderness L2-3, bil QL (R>L), and lumbar multifidi.   CERVICAL ROM:   Active ROM A/PROM (deg) eval  Flexion 40  Extension 50  Right lateral flexion 20  Left lateral flexion 20  Right rotation 55  Left rotation 60   (Blank rows = not tested)  LUMBAR ROM:   Active  A/PROM  eval  Flexion To shins *  Extension 90% limited *  Right lateral flexion To knees   Left lateral flexion To knees inc pain compared to R  Right rotation WNL  Left rotation WNL*   (Blank rows = not tested)  *pain  UPPER EXTREMITY ROM:  Active ROM Right eval Left eval  Shoulder flexion 130* 135  Shoulder extension 55* 65  Shoulder abduction 115* 115  Shoulder adduction    Shoulder extension    Shoulder internal rotation functional low back * functional midback  Shoulder external rotation FER T1* FER t2   (Blank rows = not tested)  *pain  UPPER EXTREMITY MMT:  MMT Right* eval Left eval  Shoulder flexion 4+* 5  Shoulder extension    Shoulder abduction 5 5  Shoulder adduction 5 5  Shoulder extension    Shoulder internal rotation 5 5  Shoulder external rotation 5 5  Elbow flexion 5 5  Elbow  extension 5 5  Wrist flexion 5 5  Wrist extension 5 5  Grip strength good good   (Blank rows = not tested) *pain  LOWER EXTREMITY MMT:    MMT Right eval Left eval  Hip flexion 5 5  Hip extension    Hip abduction 5 5  Hip adduction 5 5  Hip internal rotation    Hip external rotation    Knee flexion 5 5  Knee extension 5 5  Ankle dorsiflexion 5 5  Ankle plantarflexion     (Blank rows = not tested)    CERVICAL SPECIAL TESTS:  Spurling's test: Negative   FUNCTIONAL TESTS:  5 times sit to stand: 11.64 seconds without UE assist.    TODAY'S TREATMENT:  03/30/2022 Therapeutic Exercise: to improve strength and mobility.  Demo, verbal and tactile cues throughout for technique. UBE L1 x 6 min ([id number]) Manual Therapy: to decrease muscle spasm and pain and improve mobility STM/TPR to bil cervical paraspinals, upper traps, levator scapulae.  UPA mobs cervical spine grade 2-3.  STM/TPR to bil lumbar paraspinals, UPA mobs lumbar spine grade 2-3; skilled palpation and monitoring during dry needling. Trigger Point Dry-Needling  Treatment instructions: Expect mild to moderate muscle soreness. S/S of pneumothorax if dry needled over a lung field, and to seek immediate medical attention should they occur. Patient verbalized understanding of these instructions and education.  Patient Consent Given: Yes Education handout provided: Previously provided Muscles treated: bil cervical multi C3-6, bil UT, L levator scapulae, bil lumbar multifidi L2-5.  Electrical stimulation performed: No Parameters: N/A  Treatment response/outcome: Twitch Response Elicited and Palpable Increase in Muscle Length .   03/16/2022 Therapeutic Exercise: to improve strength and mobility.  Demo, verbal and tactile cues throughout for technique. Nustep L4 x 6 min  Bridges x 10  SL bridges x 10 bil  Bridges with leg extensions x 10 bil alternating legs Bird dogs x 10  Manual Therapy: to decrease muscle spasm and pain  and improve mobility STM/TPR to lumbar paraspinals, R piriformis glut, R QL; skilled palpation and monitoring during dry needling. Trigger Point Dry-Needling  Treatment instructions: Expect mild to moderate muscle soreness. S/S of pneumothorax if dry needled over a lung field, and to seek immediate medical attention should they occur. Patient verbalized understanding of these instructions and education.  Patient Consent Given: Yes Education handout provided: Previously provided Muscles treated: bil L3/4,4/5 multifidi, R glut med and piriformis, R QL Treatment response/outcome: Twitch Response Elicited and Palpable Increase in Muscle Length   03/11/22 Therapeutic Exercise: to improve strength and mobility.  Nustep L5x80mn Seated piriformis stretch x 30 sec bil w/ trunk bend - cues to keep neutral spine Seated lumbar flexion stretch x 30 sec orange pball 3 way Seated KTOS piriformis stretch x 30 sec bil Cat/cow x 15 Bird dog x 10 - unsteady on L arm/R leg Standing lumbar extension at wall 10x5" Standing row x 10 green TB Standing extension x 10 green TB  Pallof press green TB  x 10 R/L Manual Therapy: to decrease muscle spasm and pain and improve mobility STM/TPR to lumbar paraspinals; skilled palpation and monitoring during dry needling. Trigger Point Dry-Needling  Treatment instructions: Expect mild to moderate muscle soreness.  Patient verbalized understanding of these instructions and education.  Patient Consent Given: Yes Education handout provided: Previously provided Muscles treated: bil L3/4, L4/5 lumbar multifidi Treatment response/outcome: Twitch Response Elicited and Palpable Increase in Muscle Length    PATIENT EDUCATION:  Education details: continue HEP  Person educated: Patient Education method: Explanation, Demonstration, Verbal cues, and Handouts Education comprehension: verbalized understanding and returned demonstration   HOME EXERCISE PROGRAM: Access Code:  [email or web address] URL: [email or web address] Date: 03/09/2022 Prepared by: EGlenetta Hew Exercises - Supine Lower Trunk Rotation  - 1 x daily - 7 x weekly - 2 sets - 10 reps - Supine Posterior Pelvic Tilt  - 1 x daily - 7 x weekly - 2 sets - 10 reps - Static Prone on Elbows  - 1 x daily - 7 x weekly - Prone Knee Flexion  - 1 x daily - 7 x weekly - 2 sets - 10 reps - Standing Bilateral Low Shoulder Row with Anchored Resistance  - 1 x daily - 7 x weekly - 2 sets - 10 reps - Shoulder Extension with Resistance  - 1 x daily - 7 x weekly - 2 sets - 10 reps - Supine Posterior Pelvic Tilt  - 1 x daily - 7 x weekly - 1 sets - 10 reps - 5 sec  hold - Supine Bridge  - 1 x daily - 7 x weekly - 3 sets - 10 reps - Bird Dog  - 1 x daily - 7 x weekly - 3 sets - 10 reps - Cat Cow  - 1 x daily - 7 x weekly - 3 sets - 10 reps - Gentle Levator Scapulae Stretch  - 1 x daily - 7 x weekly - 1 sets - 3 reps - 10 sec hold - Seated Gentle Upper Trapezius Stretch  - 1 x daily -  7 x weekly - 1 sets - 3 reps - 10 sec  hold - Seated Shoulder Rolls  - 1 x daily - 7 x weekly - 2 sets - 10 reps - Seated Scapular Retraction  - 1 x daily - 7 x weekly - 2 sets - 10 reps   ASSESSMENT:  CLINICAL IMPRESSION: Becky [name] reports pain in low back is improving, but pain in neck is starting to come back.  She is very consistent and compliant with all her exercises, so today focused on manual therapy and TrDN, after which reported immediate improved in pain and cervical ROM.   Becky [name] continues to demonstrate potential for improvement and would benefit from continued skilled therapy to address impairments.     OBJECTIVE IMPAIRMENTS decreased activity tolerance, decreased balance, decreased endurance, decreased ROM, decreased strength, hypomobility, increased fascial restrictions, impaired perceived functional ability, increased muscle spasms, impaired flexibility, impaired UE functional use, improper body  mechanics, postural dysfunction, and pain.   ACTIVITY LIMITATIONS carrying, lifting, bending, sleeping, bed mobility, dressing, and reach over head  PARTICIPATION LIMITATIONS: meal prep, cleaning, laundry, driving, shopping, community activity, and yard work  PERSONAL FACTORS 3+ comorbidities: Chronic low back pain, migraines, scoliosis, osteoporosis, RA, IDDM, neuropathy, Sjogren's syndrome  are also affecting patient's functional outcome.   REHAB POTENTIAL: Good  CLINICAL DECISION MAKING: Evolving/moderate complexity  EVALUATION COMPLEXITY: Moderate   GOALS: Goals reviewed with patient? Yes  SHORT TERM GOALS: Target date: 02/09/2022   Patient will be independent with initial HEP.  Baseline: given for back Goal status: MET   LONG TERM GOALS: Target date: 03/16/2022 extended to 04/27/2022  Patient will be independent with advanced/ongoing HEP to improve outcomes and carryover.  Baseline: needs progression Goal status: IN PROGRESS  2.  Patient will report 75% improvement in neck pain when sleeping/waking to improve QOL.  Baseline: severe pain in mornings.  Goal status: MET  100% improvement, no pain in neck   3.  Patient will demonstrate full pain free cervical ROM for safety with driving.  Baseline: see objective Goal status: IN PROGRESS  4.  Patient will report <17/50 on NDI  to demonstrate improved functional ability. Baseline: 24/50 Goal status: IN PROGRESS  5.   Patient will report <14/50 on modified Oswestry to demonstrate improved QOL Baseline: 20/50 Goal status: IN PROGRESS  6.  Patient will report 75% improvement in LBP in mornings to improve QOL.    Baseline: 14/10 when wakes Goal status: IN PROGRESS  03/16/22- 10% improvement, not as severe in AM  7. Patient will demonstrate improved posture to decrease muscle imbalance. Baseline: forward head, rounded shoulders Goal status: IN PROGRESS  03/16/22- improving, less cues needed.     PLAN: PT FREQUENCY:  2x/week  PT DURATION: 8 weeks extended to 04/27/2022  PLANNED INTERVENTIONS: Therapeutic exercises, Therapeutic activity, Neuromuscular re-education, Balance training, Gait training, Patient/Family education, Self Care, Joint mobilization, Dry Needling, Electrical stimulation, Spinal mobilization, Cryotherapy, Moist heat, Traction, Ultrasound, Ionotophoresis 31m/ml Dexamethasone, Manual therapy, and Re-evaluation  PLAN FOR NEXT SESSION: check cervical ROM, review and progress core strengthening exercises, manual therapy, dry needling, modalities PRN.   Add weight bearing/resistance training for osteopenia as appropriate.    ERennie Natter PT, DPT 03/30/2022, 3:07 PM

## 2022-04-02 DIAGNOSIS — Z794 Long term (current) use of insulin: Secondary | ICD-10-CM | POA: Diagnosis not present

## 2022-04-02 DIAGNOSIS — E109 Type 1 diabetes mellitus without complications: Secondary | ICD-10-CM | POA: Diagnosis not present

## 2022-04-06 ENCOUNTER — Ambulatory Visit: Payer: Medicare Other | Admitting: Physical Therapy

## 2022-04-06 ENCOUNTER — Encounter: Payer: Self-pay | Admitting: Physical Therapy

## 2022-04-06 DIAGNOSIS — G8929 Other chronic pain: Secondary | ICD-10-CM

## 2022-04-06 DIAGNOSIS — R252 Cramp and spasm: Secondary | ICD-10-CM

## 2022-04-06 DIAGNOSIS — M25511 Pain in right shoulder: Secondary | ICD-10-CM | POA: Diagnosis not present

## 2022-04-06 DIAGNOSIS — M5441 Lumbago with sciatica, right side: Secondary | ICD-10-CM | POA: Diagnosis not present

## 2022-04-06 DIAGNOSIS — M6281 Muscle weakness (generalized): Secondary | ICD-10-CM | POA: Diagnosis not present

## 2022-04-06 DIAGNOSIS — R293 Abnormal posture: Secondary | ICD-10-CM

## 2022-04-06 DIAGNOSIS — M542 Cervicalgia: Secondary | ICD-10-CM | POA: Diagnosis not present

## 2022-04-06 NOTE — Therapy (Signed)
OUTPATIENT PHYSICAL THERAPY TREATMENT   Patient Name: Becky [name] MRN: [id number] DOB:19-Oct-1955, 66 y.o., female Today's Date: 04/06/2022   PT End of Session - 04/06/22 1407     Visit Number 9    Number of Visits 16    Date for PT Re-Evaluation 04/27/22    Authorization Type [employer]    Progress Note Due on Visit 16    PT Start Time 1404    PT Stop Time 1446    PT Time Calculation (min) 42 min    Activity Tolerance Patient tolerated treatment well    Behavior During Therapy WFL for tasks assessed/performed                Past Medical History:  Diagnosis Date   Abnormal ECG    Anemia    Anxiety    Cataract    . 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   Sjoegren syndrome    Past Surgical History:  Procedure Laterality Date   back ablation  03/2020   back ablation  10/09/2020   BREAST LUMPECTOMY  1995   CARPAL TUNNEL RELEASE  2000   CATARACT EXTRACTION Right 09-05-12   CATARACT EXTRACTION Left 3/15   CESAREAN SECTION  1987   COLONOSCOPY     EYE SURGERY     diabetic retinopathy    HYSTEROSCOPY  02/2001   and D & C   LYMPH NODE DISSECTION  2002   TOE SURGERY  2000   bone removed   Patient Active Problem List   Diagnosis Date Noted   Posterior vitreous detachment of left eye 09/15/2021   Dry eye syndrome, bilateral 09/15/2021   Scoliosis 07/24/2020   Spondylosis without myelopathy or radiculopathy, lumbar region 07/24/2020   Chronic bilateral low back pain without sciatica 07/24/2020   Posterior vitreous  detachment of right eye 03/18/2020   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 determined by examination associated with type 1 diabetes mellitus ([place]) 03/18/2020   Stable treated proliferative diabetic retinopathy of right eye determined by examination associated with type 2 diabetes mellitus ([place]) 03/18/2020   History of vitrectomy 27/08/5007   Complicated UTI (urinary tract infection) 12/13/2018   Failure of outpatient treatment 38/18/2993   Acute metabolic encephalopathy 71/69/6789   Fever 12/10/2018   At high risk for breast cancer 05/09/2018   Eosinophilia 01/31/2017   Iritis 01/04/2017   History of joint swelling 08/24/2016   Osteoarthritis of knees, bilateral 08/24/2016   History of migraine 08/24/2016   History of depression 08/24/2016   History of osteoporosis 08/24/2016   DDD (degenerative disc disease), lumbar 08/24/2016   High risk medication use 08/24/2016   Trigger finger, left middle finger 08/24/2016   Trigger finger, right ring finger 08/24/2016   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   Increased risk of breast cancer 07/06/2015   IDDM (insulin dependent diabetes mellitus) 07/06/2015   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 without organ or systems involvement ([place])  07/06/2015   Sjogren's syndrome ([place]) 07/06/2015   Genetic testing 05/12/2015   Family history of breast cancer 04/15/2015   Fibrocystic breast disease 10/11/2012   GERD 10/01/2009   Chest pain with moderate risk for cardiac etiology 09/02/2009   DYSPHAGIA 09/02/2009   ANXIETY 08/15/2009   ULCER-GASTRIC 08/15/2009   CONSTIPATION 08/15/2009   SLOW TRANSIT CONSTIPATION 08/15/2009   FLATULENCE ERUCTATION AND GAS PAIN 08/15/2009    PCP: Reynold Bowen, MD  REFERRING PROVIDER: Lorine Bears, NP  REFERRING DIAG: [id number] (ICD-10-CM) - Radiculopathy, cervical region M25.511,G89.29 (ICD-10-CM) - Chronic right shoulder pain M54.2 (ICD-10-CM) -  Cervicalgia M79.18 (ICD-10-CM) - Myofascial pain syndrome  THERAPY DIAG:  Cervicalgia  Chronic bilateral low back pain with right-sided sciatica  Chronic right shoulder pain  Abnormal posture  Muscle weakness (generalized)  Cramp and spasm  Rationale for Evaluation and Treatment Rehabilitation  ONSET DATE: several years  SUBJECTIVE:                                                                                                                                                                                                         SUBJECTIVE STATEMENT: Becky [name] reports that she hurt this morning, so got up and did all her exercises, which helped loosen her up.    She reports the pain is getting much better, no pain at all on her L side neck or back now, and the spots on her R side are much more localized.    PERTINENT HISTORY:  Chronic low back pain with radiculopathy, myofascial trigger point injections every 6 weeks, back ablation, scoliosis, osteoporosis, RA, Type 1 DM, neuropathy, Sjogren's syndrome  PAIN:  Are you having pain? Yes: NPRS scale:  8/10 Pain location: localized to R side base of neck (levator scapulae)  Pain description: feels like a block hurts really bad  Are you having pain? Yes: NPRS scale: 8/10 Pain location: localized area in R side low back  Pain description: constant  Aggravating factors: worse in mornings Relieving factors: improves with movement, heat  PRECAUTIONS: None  WEIGHT BEARING RESTRICTIONS No  FALLS:  Has patient fallen in last 6 months? No  feels unsteady due to severe neuropathy in feet.   LIVING ENVIRONMENT: Lives with: lives with their spouse Lives in: House/apartment Stairs: No Has following equipment at home: None  OCCUPATION: retired,  walks 30 min daily  PLOF: Independent  PATIENT GOALS: improve neck and back pain.   OBJECTIVE:   DIAGNOSTIC FINDINGS:  MRI Cervical Spine 01/12/2022  IMPRESSION: 1. C4-C5  mild spinal canal stenosis  and mild right neural foraminal narrowing. 2. C6-C7 moderate right and mild left neural foraminal narrowing. 3. C3-C4 mild right neural foraminal narrowing. MRI Lumbar spine 01/12/2022  IMPRESSION: 1. L1-L2 mild right neural foraminal narrowing. Narrowing of the right lateral recess at this level could affect the descending right L2 nerve roots. 2. Multilevel facet arthropathy, which can be a cause of back pain.   PATIENT SURVEYS:  Modified Oswestry 20/50= 40% moderate disability  NDI 24/50 = 48% moderate disability    COGNITION: Overall cognitive status: Within functional limits for tasks assessed   SENSATION: Severe neuropathy bil feet.   POSTURE: rounded shoulders and forward head - right shoulder forward more than left.   PALPATION: Hypomobility throughout cervical, upper thoracic, and lumbar spine.  Tenderness R UT, levator, suboccipitals and cervical erector spinae. Elevated R 1st rib, forward R shoulder.  Tenderness L2-3, bil QL (R>L), and lumbar multifidi.   CERVICAL ROM:   Active ROM A/PROM (deg) eval AROM  04/06/2022  Flexion 40 45  Extension 50 55  Right lateral flexion 20 20  Left lateral flexion 20 30  Right rotation 55 60  Left rotation 60 70   (Blank rows = not tested)  LUMBAR ROM:   Active  A/PROM  eval AROM 04/06/2022  Flexion To shins * Almost to ankles  Extension 90% limited * No pain, limited 50%  Right lateral flexion To knees  To  knees, slight increase in pain  Left lateral flexion To knees inc pain compared to R To knees  Right rotation WNL WNL  Left rotation WNL* WNL    (Blank rows = not tested)  *pain  UPPER EXTREMITY ROM:  Active ROM Right eval Left eval  Shoulder flexion 130* 135  Shoulder extension 55* 65  Shoulder abduction 115* 115  Shoulder adduction    Shoulder extension    Shoulder internal rotation functional low back * functional midback  Shoulder external rotation FER T1* FER t2   (Blank  rows = not tested)  *pain  UPPER EXTREMITY MMT:  MMT Right* eval Left eval  Shoulder flexion 4+* 5  Shoulder extension    Shoulder abduction 5 5  Shoulder adduction 5 5  Shoulder extension    Shoulder internal rotation 5 5  Shoulder external rotation 5 5  Elbow flexion 5 5  Elbow extension 5 5  Wrist flexion 5 5  Wrist extension 5 5  Grip strength good good   (Blank rows = not tested) *pain  LOWER EXTREMITY MMT:    MMT Right eval Left eval  Hip flexion 5 5  Hip extension    Hip abduction 5 5  Hip adduction 5 5  Hip internal rotation    Hip external rotation    Knee flexion 5 5  Knee extension 5 5  Ankle dorsiflexion 5 5  Ankle plantarflexion     (Blank rows = not tested)    CERVICAL SPECIAL TESTS:  Spurling's test: Negative   FUNCTIONAL TESTS:  5 times sit to stand: 11.64 seconds without UE assist.    TODAY'S TREATMENT:  04/06/2022 Therapeutic Exercise: to improve strength and mobility.  Demo, verbal and tactile cues throughout for technique. UBE L1 x 6 min ([id number]) Review HEP and piriformis stretches - corrected on form, instructed to perform LTR to non-painful side only.  Manual Therapy: to decrease muscle spasm and pain and improve mobility STM/TPR to R cervical paraspinals, UT and levator, R UPA mobs cervical spine grade 2-3; STM/TPR to R QL; skilled  palpation and monitoring during dry needling. Trigger Point Dry-Needling  Treatment instructions: Expect mild to moderate muscle soreness. S/S of pneumothorax if dry needled over a lung field, and to seek immediate medical attention should they occur. Patient verbalized understanding of these instructions and education.  Patient Consent Given: Yes Education handout provided: Previously provided Muscles treated: R Levator, R UT, R QL Electrical stimulation performed: No Parameters: N/A Treatment response/outcome: Twitch Response Elicited and Palpable Increase in Muscle Length   03/30/2022 Therapeutic  Exercise: to improve strength and mobility.  Demo, verbal and tactile cues throughout for technique. UBE L1 x 6 min (73f3b) Manual Therapy: to decrease muscle spasm and pain and improve mobility STM/TPR to bil cervical paraspinals, upper traps, levator scapulae.  UPA mobs cervical spine grade 2-3.  STM/TPR to bil lumbar paraspinals, UPA mobs lumbar spine grade 2-3; skilled palpation and monitoring during dry needling. Trigger Point Dry-Needling  Treatment instructions: Expect mild to moderate muscle soreness. S/S of pneumothorax if dry needled over a lung field, and to seek immediate medical attention should they occur. Patient verbalized understanding of these instructions and education.  Patient Consent Given: Yes Education handout provided: Previously provided Muscles treated: bil cervical multi C3-6, bil UT, L levator scapulae, bil lumbar multifidi L2-5.  Electrical stimulation performed: No Parameters: N/A Treatment response/outcome: Twitch Response Elicited and Palpable Increase in Muscle Length .   03/16/2022 Therapeutic Exercise: to improve strength and mobility.  Demo, verbal and tactile cues throughout for technique. Nustep L4 x 6 min  Bridges x 10  SL bridges x 10 bil  Bridges with leg extensions x 10 bil alternating legs Bird dogs x 10  Manual Therapy: to decrease muscle spasm and pain and improve mobility STM/TPR to lumbar paraspinals, R piriformis glut, R QL; skilled palpation and monitoring during dry needling. Trigger Point Dry-Needling  Treatment instructions: Expect mild to moderate muscle soreness. S/S of pneumothorax if dry needled over a lung field, and to seek immediate medical attention should they occur. Patient verbalized understanding of these instructions and education.  Patient Consent Given: Yes Education handout provided: Previously provided Muscles treated: bil L3/4,4/5 multifidi, R glut med and piriformis, R QL Treatment response/outcome: Twitch Response  Elicited and Palpable Increase in Muscle Length    PATIENT EDUCATION:  Education details: reviewed HEP  Person educated: Patient Education method: ECustomer service managerEducation comprehension: verbalized understanding and returned demonstration   HOME EXERCISE PROGRAM: Access Code: [email or web address]  ASSESSMENT:  CLINICAL IMPRESSION: Becky SWORDreports continued improvement in pain in both neck and back, and now only notices pain in very localized spots, which were both addressed today with manual therapy/TrDN.  She did need review and correction of some stretches today.   Becky Rudcontinues to demonstrate potential for improvement and would benefit from continued skilled therapy to address impairments.     OBJECTIVE IMPAIRMENTS decreased activity tolerance, decreased balance, decreased endurance, decreased ROM, decreased strength, hypomobility, increased fascial restrictions, impaired perceived functional ability, increased muscle spasms, impaired flexibility, impaired UE functional use, improper body mechanics, postural dysfunction, and pain.   ACTIVITY LIMITATIONS carrying, lifting, bending, sleeping, bed mobility, dressing, and reach over head  PARTICIPATION LIMITATIONS: meal prep, cleaning, laundry, driving, shopping, community activity, and yard work  PERSONAL FACTORS 3+ comorbidities: Chronic low back pain, migraines, scoliosis, osteoporosis, RA, IDDM, neuropathy, Sjogren's syndrome  are also affecting patient's functional outcome.   REHAB POTENTIAL: Good  CLINICAL DECISION MAKING: Evolving/moderate complexity  EVALUATION COMPLEXITY: Moderate   GOALS: Goals reviewed with  patient? Yes  SHORT TERM GOALS: Target date: 02/09/2022   Patient will be independent with initial HEP.  Baseline: given for back Goal status: MET   LONG TERM GOALS: Target date: 03/16/2022 extended to 04/27/2022  Patient will be independent with advanced/ongoing HEP to improve  outcomes and carryover.  Baseline: needs progression Goal status: IN PROGRESS  2.  Patient will report 75% improvement in neck pain when sleeping/waking to improve QOL.  Baseline: severe pain in mornings.  Goal status: MET  100% improvement, no pain in neck   3.  Patient will demonstrate full pain free cervical ROM for safety with driving.  Baseline: see objective Goal status: IN PROGRESS 04/06/22- see objective, improved, still some pain with L SB  4.  Patient will report <17/50 on NDI  to demonstrate improved functional ability. Baseline: 24/50 Goal status: IN PROGRESS  5.   Patient will report <14/50 on modified Oswestry to demonstrate improved QOL Baseline: 20/50 Goal status: IN PROGRESS  6.  Patient will report 75% improvement in LBP in mornings to improve QOL.    Baseline: 14/10 when wakes Goal status: IN PROGRESS  03/16/22- 10% improvement, not as severe in AM  7. Patient will demonstrate improved posture to decrease muscle imbalance. Baseline: forward head, rounded shoulders Goal status: IN PROGRESS  03/16/22- improving, less cues needed.     PLAN: PT FREQUENCY: 2x/week  PT DURATION: 8 weeks extended to 04/27/2022  PLANNED INTERVENTIONS: Therapeutic exercises, Therapeutic activity, Neuromuscular re-education, Balance training, Gait training, Patient/Family education, Self Care, Joint mobilization, Dry Needling, Electrical stimulation, Spinal mobilization, Cryotherapy, Moist heat, Traction, Ultrasound, Ionotophoresis 2m/ml Dexamethasone, Manual therapy, and Re-evaluation  PLAN FOR NEXT SESSION: check cervical ROM, review and progress core strengthening exercises, manual therapy, dry needling, modalities PRN.   Add weight bearing/resistance training for osteopenia as appropriate.    ERennie Natter PT, DPT 04/06/2022, 3:04 PM

## 2022-04-08 ENCOUNTER — Other Ambulatory Visit: Payer: Self-pay | Admitting: Rheumatology

## 2022-04-08 ENCOUNTER — Other Ambulatory Visit: Payer: Self-pay | Admitting: *Deleted

## 2022-04-08 DIAGNOSIS — Z79899 Other long term (current) drug therapy: Secondary | ICD-10-CM

## 2022-04-08 DIAGNOSIS — Z1231 Encounter for screening mammogram for malignant neoplasm of breast: Secondary | ICD-10-CM | POA: Diagnosis not present

## 2022-04-08 MED ORDER — ENBREL SURECLICK 50 MG/ML ~~LOC~~ SOAJ
SUBCUTANEOUS | 0 refills | Status: DC
Start: 2022-04-08 — End: 2023-01-10

## 2022-04-08 NOTE — Telephone Encounter (Signed)
Next Visit: 07/19/2022  Last Visit: 02/11/2022  Last Fill: 12/24/2021  [id number] arthritis with rheumatoid factor of multiple sites without organ or systems involvement   Current Dose per office note 02/11/2022: Enbrel 50 mg sq injections once weekly  Labs: 12/23/2021 CBC CMP and vitamin D are within normal limits.  Glucose is mildly elevated.  TB Gold: 07/10/2021   Okay to refill Enbrel?

## 2022-04-08 NOTE — Telephone Encounter (Signed)
Patient requested prescription refill of Enbrel to be sent to [facility].

## 2022-04-09 ENCOUNTER — Encounter: Payer: Self-pay | Admitting: Physical Therapy

## 2022-04-09 ENCOUNTER — Ambulatory Visit: Payer: Medicare Other | Admitting: Physical Therapy

## 2022-04-09 DIAGNOSIS — G8929 Other chronic pain: Secondary | ICD-10-CM

## 2022-04-09 DIAGNOSIS — R293 Abnormal posture: Secondary | ICD-10-CM

## 2022-04-09 DIAGNOSIS — M5441 Lumbago with sciatica, right side: Secondary | ICD-10-CM | POA: Diagnosis not present

## 2022-04-09 DIAGNOSIS — R252 Cramp and spasm: Secondary | ICD-10-CM | POA: Diagnosis not present

## 2022-04-09 DIAGNOSIS — M542 Cervicalgia: Secondary | ICD-10-CM | POA: Diagnosis not present

## 2022-04-09 DIAGNOSIS — M25511 Pain in right shoulder: Secondary | ICD-10-CM | POA: Diagnosis not present

## 2022-04-09 DIAGNOSIS — M6281 Muscle weakness (generalized): Secondary | ICD-10-CM | POA: Diagnosis not present

## 2022-04-09 LAB — COMPLETE METABOLIC PANEL WITH GFR
AG Ratio: 1.6 (calc) (ref 1.0–2.5)
ALT: 19 U/L (ref 6–29)
AST: 24 U/L (ref 10–35)
Albumin: 4.2 g/dL (ref 3.6–5.1)
Alkaline phosphatase (APISO): 58 U/L (ref 37–153)
BUN: 15 mg/dL (ref 7–25)
CO2: 26 mmol/L (ref 20–32)
Calcium: 9.8 mg/dL (ref 8.6–10.4)
Chloride: 106 mmol/L (ref 98–110)
Creat: 0.99 mg/dL (ref 0.50–1.05)
Globulin: 2.7 g/dL (calc) (ref 1.9–3.7)
Glucose, Bld: 135 mg/dL — ABNORMAL HIGH (ref 65–99)
Potassium: 4.3 mmol/L (ref 3.5–5.3)
Sodium: 141 mmol/L (ref 135–146)
Total Bilirubin: 0.4 mg/dL (ref 0.2–1.2)
Total Protein: 6.9 g/dL (ref 6.1–8.1)
eGFR: 63 mL/min/{1.73_m2} (ref >=60)

## 2022-04-09 LAB — CBC WITH DIFFERENTIAL/PLATELET
Absolute Monocytes: 511 cells/uL (ref 200–950)
Basophils Absolute: 111 cells/uL (ref 0–200)
Basophils Relative: 1.5 %
Eosinophils Absolute: 585 cells/uL — ABNORMAL HIGH (ref 15–500)
Eosinophils Relative: 7.9 %
HCT: 42.7 % (ref 35.0–45.0)
Hemoglobin: 14.3 g/dL (ref 11.7–15.5)
Lymphs Abs: 2346 cells/uL (ref 850–3900)
MCH: 30.6 pg (ref 27.0–33.0)
MCHC: 33.5 g/dL (ref 32.0–36.0)
MCV: 91.2 fL (ref 80.0–100.0)
MPV: 13.3 fL — ABNORMAL HIGH (ref 7.5–12.5)
Monocytes Relative: 6.9 %
Neutro Abs: 3848 cells/uL (ref 1500–7800)
Neutrophils Relative %: 52 %
Platelets: 273 10*3/uL (ref 140–400)
RBC: 4.68 10*6/uL (ref 3.80–5.10)
RDW: 13.2 % (ref 11.0–15.0)
Total Lymphocyte: 31.7 %
WBC: 7.4 10*3/uL (ref 3.8–10.8)

## 2022-04-09 NOTE — Therapy (Signed)
OUTPATIENT PHYSICAL THERAPY TREATMENT   Patient Name: MANETTE DOTO MRN: [id number] DOB:May 11, 1956, 66 y.o., female Today's Date: 04/09/2022   PT End of Session - 04/09/22 1152     Visit Number 10    Number of Visits 16    Date for PT Re-Evaluation 04/27/22    Authorization Type [employer]    Progress Note Due on Visit 16    PT Start Time 1100    PT Stop Time 1145    PT Time Calculation (min) 45 min    Activity Tolerance Patient tolerated treatment well    Behavior During Therapy WFL for tasks assessed/performed                Past Medical History:  Diagnosis Date   Abnormal ECG    Anemia    Anxiety    Cataract    . 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   Sjoegren syndrome    Past Surgical History:  Procedure Laterality Date   back ablation  03/2020   back ablation  10/09/2020   BREAST LUMPECTOMY  1995   CARPAL TUNNEL RELEASE  2000   CATARACT EXTRACTION Right 09-05-12   CATARACT EXTRACTION Left 3/15   CESAREAN SECTION  1987   COLONOSCOPY     EYE SURGERY     diabetic retinopathy    HYSTEROSCOPY  02/2001   and D & C   LYMPH NODE DISSECTION  2002   TOE SURGERY  2000   bone removed   Patient Active Problem List   Diagnosis Date Noted   Posterior vitreous detachment of left eye 09/15/2021   Dry eye syndrome, bilateral 09/15/2021   Scoliosis 07/24/2020   Spondylosis without myelopathy or radiculopathy, lumbar region 07/24/2020   Chronic bilateral low back pain without sciatica 07/24/2020   Posterior vitreous  detachment of right eye 03/18/2020   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 determined by examination associated with type 1 diabetes mellitus ([place]) 03/18/2020   Stable treated proliferative diabetic retinopathy of right eye determined by examination associated with type 2 diabetes mellitus ([place]) 03/18/2020   History of vitrectomy 99/35/7017   Complicated UTI (urinary tract infection) 12/13/2018   Failure of outpatient treatment 79/39/0300   Acute metabolic encephalopathy 92/33/0076   Fever 12/10/2018   At high risk for breast cancer 05/09/2018   Eosinophilia 01/31/2017   Iritis 01/04/2017   History of joint swelling 08/24/2016   Osteoarthritis of knees, bilateral 08/24/2016   History of migraine 08/24/2016   History of depression 08/24/2016   History of osteoporosis 08/24/2016   DDD (degenerative disc disease), lumbar 08/24/2016   High risk medication use 08/24/2016   Trigger finger, left middle finger 08/24/2016   Trigger finger, right ring finger 08/24/2016   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   Increased risk of breast cancer 07/06/2015   IDDM (insulin dependent diabetes mellitus) 07/06/2015   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 without organ or systems involvement ([place])  07/06/2015   Sjogren's syndrome ([place]) 07/06/2015   Genetic testing 05/12/2015   Family history of breast cancer 04/15/2015   Fibrocystic breast disease 10/11/2012   GERD 10/01/2009   Chest pain with moderate risk for cardiac etiology 09/02/2009   DYSPHAGIA 09/02/2009   ANXIETY 08/15/2009   ULCER-GASTRIC 08/15/2009   CONSTIPATION 08/15/2009   SLOW TRANSIT CONSTIPATION 08/15/2009   FLATULENCE ERUCTATION AND GAS PAIN 08/15/2009    PCP: Reynold Bowen, MD  REFERRING PROVIDER: Lorine Bears, NP  REFERRING DIAG: [phone] (ICD-10-CM) - Radiculopathy, cervical region M25.511,G89.29 (ICD-10-CM) - Chronic right shoulder pain M54.2 (ICD-10-CM) -  Cervicalgia M79.18 (ICD-10-CM) - Myofascial pain syndrome  THERAPY DIAG:  Cervicalgia  Chronic bilateral low back pain with right-sided sciatica  Chronic right shoulder pain  Abnormal posture  Muscle weakness (generalized)  Cramp and spasm  Rationale for Evaluation and Treatment Rehabilitation  ONSET DATE: several years  SUBJECTIVE:                                                                                                                                                                                                         SUBJECTIVE STATEMENT: [name] reports she does not feel well today, exhausted, almost called out of therapy but her neck is really bothering her today.     PERTINENT HISTORY:  Chronic low back pain with radiculopathy, myofascial trigger point injections every 6 weeks, back ablation, scoliosis, osteoporosis, RA, Type 1 DM, neuropathy, Sjogren's syndrome  PAIN:  Are you having pain? Yes: NPRS scale:  8/10 Pain location: localized to R side base of neck (levator scapulae)  Pain description: feels like a block hurts really bad  Are you having pain? Yes: NPRS scale: 8/10 Pain location: localized area in R side low back  Pain description: constant  Aggravating factors: worse in mornings Relieving factors: improves with movement, heat  PRECAUTIONS: None  WEIGHT BEARING RESTRICTIONS No  FALLS:  Has patient fallen in last 6 months? No  feels unsteady due to severe neuropathy in feet.   LIVING ENVIRONMENT: Lives with: lives with their spouse Lives in: House/apartment Stairs: No Has following equipment at home: None  OCCUPATION: retired,  walks 30 min daily  PLOF: Independent  PATIENT GOALS: improve neck and back pain.   OBJECTIVE:   DIAGNOSTIC FINDINGS:  MRI Cervical Spine 01/12/2022  IMPRESSION: 1. C4-C5 mild spinal canal stenosis and mild right neural foraminal narrowing. 2. C6-C7 moderate right and mild left neural foraminal  narrowing. 3. C3-C4 mild right neural foraminal narrowing. MRI Lumbar spine 01/12/2022  IMPRESSION: 1. L1-L2  mild right neural foraminal narrowing. Narrowing of the right lateral recess at this level could affect the descending right L2 nerve roots. 2. Multilevel facet arthropathy, which can be a cause of back pain.   PATIENT SURVEYS:  Modified Oswestry 20/50= 40% moderate disability  NDI 24/50 = 48% moderate disability    COGNITION: Overall cognitive status: Within functional limits for tasks assessed   SENSATION: Severe neuropathy bil feet.   POSTURE: rounded shoulders and forward head - right shoulder forward more than left.   PALPATION: Hypomobility throughout cervical, upper thoracic, and lumbar spine.  Tenderness R UT, levator, suboccipitals and cervical erector spinae. Elevated R 1st rib, forward R shoulder.  Tenderness L2-3, bil QL (R>L), and lumbar multifidi.   CERVICAL ROM:   Active ROM A/PROM (deg) eval AROM  04/06/2022  Flexion 40 45  Extension 50 55  Right lateral flexion 20 20  Left lateral flexion 20 30  Right rotation 55 60  Left rotation 60 70   (Blank rows = not tested)  LUMBAR ROM:   Active  A/PROM  eval AROM 04/06/2022  Flexion To shins * Almost to ankles  Extension 90% limited * No pain, limited 50%  Right lateral flexion To knees  To  knees, slight increase in pain  Left lateral flexion To knees inc pain compared to R To knees  Right rotation WNL WNL  Left rotation WNL* WNL    (Blank rows = not tested)  *pain  UPPER EXTREMITY ROM:  Active ROM Right eval Left eval  Shoulder flexion 130* 135  Shoulder extension 55* 65  Shoulder abduction 115* 115  Shoulder adduction    Shoulder extension    Shoulder internal rotation functional low back * functional midback  Shoulder external rotation FER T1* FER t2   (Blank rows = not tested)  *pain  UPPER EXTREMITY MMT:  MMT Right* eval Left eval  Shoulder flexion 4+* 5  Shoulder extension     Shoulder abduction 5 5  Shoulder adduction 5 5  Shoulder extension    Shoulder internal rotation 5 5  Shoulder external rotation 5 5  Elbow flexion 5 5  Elbow extension 5 5  Wrist flexion 5 5  Wrist extension 5 5  Grip strength good good   (Blank rows = not tested) *pain  LOWER EXTREMITY MMT:    MMT Right eval Left eval  Hip flexion 5 5  Hip extension    Hip abduction 5 5  Hip adduction 5 5  Hip internal rotation    Hip external rotation    Knee flexion 5 5  Knee extension 5 5  Ankle dorsiflexion 5 5  Ankle plantarflexion     (Blank rows = not tested)    CERVICAL SPECIAL TESTS:  Spurling's test: Negative   FUNCTIONAL TESTS:  5 times sit to stand: 11.64 seconds without UE assist.    TODAY'S TREATMENT:  04/09/2022 Manual Therapy: to decrease muscle spasm and pain and improve mobility STM/TPR to bil cervical paraspinals, UT and levator, CPA and UPA mobs cervical spine grade 2-3; pec stretches, UT stretch, scapular mobs.  Skilled palpation and monitoring during dry needling. Trigger Point Dry-Needling  Treatment instructions: Expect mild to moderate muscle soreness. S/S of pneumothorax if dry needled over a lung field, and to seek immediate medical attention should they occur. Patient verbalized understanding of these instructions and education.  Patient Consent Given: Yes Education handout provided: Previously provided Muscles treated: bil Levator, bil UT Electrical stimulation performed: No Parameters: N/A  Treatment response/outcome: Twitch Response Elicited and Palpable Increase in Muscle Length   04/06/2022 Therapeutic Exercise: to improve strength and mobility.  Demo, verbal and tactile cues throughout for technique. UBE L1 x 6 min ([id number]) Review HEP and piriformis stretches - corrected on form, instructed to perform LTR to non-painful side only.  Manual Therapy: to decrease muscle spasm and pain and improve mobility STM/TPR to R cervical paraspinals, UT  and levator, R UPA mobs cervical spine grade 2-3; STM/TPR to R QL; skilled palpation and monitoring during dry needling. Trigger Point Dry-Needling  Treatment instructions: Expect mild to moderate muscle soreness. S/S of pneumothorax if dry needled over a lung field, and to seek immediate medical attention should they occur. Patient verbalized understanding of these instructions and education.  Patient Consent Given: Yes Education handout provided: Previously provided Muscles treated: R Levator, R UT, R QL Electrical stimulation performed: No Parameters: N/A Treatment response/outcome: Twitch Response Elicited and Palpable Increase in Muscle Length   03/30/2022 Therapeutic Exercise: to improve strength and mobility.  Demo, verbal and tactile cues throughout for technique. UBE L1 x 6 min ([id number]) Manual Therapy: to decrease muscle spasm and pain and improve mobility STM/TPR to bil cervical paraspinals, upper traps, levator scapulae.  UPA mobs cervical spine grade 2-3.  STM/TPR to bil lumbar paraspinals, UPA mobs lumbar spine grade 2-3; skilled palpation and monitoring during dry needling. Trigger Point Dry-Needling  Treatment instructions: Expect mild to moderate muscle soreness. S/S of pneumothorax if dry needled over a lung field, and to seek immediate medical attention should they occur. Patient verbalized understanding of these instructions and education.  Patient Consent Given: Yes Education handout provided: Previously provided Muscles treated: bil cervical multi C3-6, bil UT, L levator scapulae, bil lumbar multifidi L2-5.  Electrical stimulation performed: No Parameters: N/A Treatment response/outcome: Twitch Response Elicited and Palpable Increase in Muscle Length .   03/16/2022 Therapeutic Exercise: to improve strength and mobility.  Demo, verbal and tactile cues throughout for technique. Nustep L4 x 6 min  Bridges x 10  SL bridges x 10 bil  Bridges with leg extensions x 10 bil  alternating legs Bird dogs x 10  Manual Therapy: to decrease muscle spasm and pain and improve mobility STM/TPR to lumbar paraspinals, R piriformis glut, R QL; skilled palpation and monitoring during dry needling. Trigger Point Dry-Needling  Treatment instructions: Expect mild to moderate muscle soreness. S/S of pneumothorax if dry needled over a lung field, and to seek immediate medical attention should they occur. Patient verbalized understanding of these instructions and education.  Patient Consent Given: Yes Education handout provided: Previously provided Muscles treated: bil L3/4,4/5 multifidi, R glut med and piriformis, R QL Treatment response/outcome: Twitch Response Elicited and Palpable Increase in Muscle Length    PATIENT EDUCATION:  Education details: reviewed HEP  Person educated: Patient Education method: ExCustomer service managerducation comprehension: verbalized understanding and returned demonstration   HOME EXERCISE PROGRAM: Access Code: [email or web address]:  CLINICAL IMPRESSION: Focus of today's skilled intervention was on manual therapy to decrease neck tightness and pain, since she was obviously not feeling well and appeared exhausted.  She responded well and reported significant relief following interventions.  JoOralia Rudontinues to demonstrate potential for improvement and would benefit from continued skilled therapy to address impairments.     OBJECTIVE IMPAIRMENTS decreased activity tolerance, decreased balance, decreased endurance, decreased ROM, decreased strength, hypomobility, increased fascial restrictions, impaired perceived functional ability, increased muscle spasms, impaired flexibility, impaired UE functional use, improper body mechanics,  postural dysfunction, and pain.   ACTIVITY LIMITATIONS carrying, lifting, bending, sleeping, bed mobility, dressing, and reach over head  PARTICIPATION LIMITATIONS: meal prep, cleaning, laundry,  driving, shopping, community activity, and yard work  PERSONAL FACTORS 3+ comorbidities: Chronic low back pain, migraines, scoliosis, osteoporosis, RA, IDDM, neuropathy, Sjogren's syndrome  are also affecting patient's functional outcome.   REHAB POTENTIAL: Good  CLINICAL DECISION MAKING: Evolving/moderate complexity  EVALUATION COMPLEXITY: Moderate   GOALS: Goals reviewed with patient? Yes  SHORT TERM GOALS: Target date: 02/09/2022   Patient will be independent with initial HEP.  Baseline: given for back Goal status: MET   LONG TERM GOALS: Target date: 03/16/2022 extended to 04/27/2022  Patient will be independent with advanced/ongoing HEP to improve outcomes and carryover.  Baseline: needs progression Goal status: IN PROGRESS  2.  Patient will report 75% improvement in neck pain when sleeping/waking to improve QOL.  Baseline: severe pain in mornings.  Goal status: MET  100% improvement, no pain in neck   3.  Patient will demonstrate full pain free cervical ROM for safety with driving.  Baseline: see objective Goal status: IN PROGRESS 04/06/22- see objective, improved, still some pain with L SB  4.  Patient will report <17/50 on NDI  to demonstrate improved functional ability. Baseline: 24/50 Goal status: IN PROGRESS  5.   Patient will report <14/50 on modified Oswestry to demonstrate improved QOL Baseline: 20/50 Goal status: IN PROGRESS  6.  Patient will report 75% improvement in LBP in mornings to improve QOL.    Baseline: 14/10 when wakes Goal status: IN PROGRESS  03/16/22- 10% improvement, not as severe in AM  7. Patient will demonstrate improved posture to decrease muscle imbalance. Baseline: forward head, rounded shoulders Goal status: IN PROGRESS  03/16/22- improving, less cues needed.     PLAN: PT FREQUENCY: 2x/week  PT DURATION: 8 weeks extended to 04/27/2022  PLANNED INTERVENTIONS: Therapeutic exercises, Therapeutic activity, Neuromuscular re-education,  Balance training, Gait training, Patient/Family education, Self Care, Joint mobilization, Dry Needling, Electrical stimulation, Spinal mobilization, Cryotherapy, Moist heat, Traction, Ultrasound, Ionotophoresis 52m/ml Dexamethasone, Manual therapy, and Re-evaluation  PLAN FOR NEXT SESSION: check cervical ROM, review and progress core strengthening exercises, manual therapy, dry needling, modalities PRN.   Add weight bearing/resistance training for osteopenia as appropriate.    ERennie Natter PT, DPT 04/09/2022, 11:59 AM

## 2022-04-09 NOTE — Progress Notes (Signed)
Glucose is elevated at 135, CBC is normal.  Please forward results to her PCP.

## 2022-04-12 ENCOUNTER — Encounter: Payer: Self-pay | Admitting: Obstetrics and Gynecology

## 2022-04-13 ENCOUNTER — Ambulatory Visit: Payer: Medicare Other | Admitting: Physical Therapy

## 2022-04-13 ENCOUNTER — Encounter: Payer: Self-pay | Admitting: Physical Therapy

## 2022-04-13 DIAGNOSIS — R252 Cramp and spasm: Secondary | ICD-10-CM

## 2022-04-13 DIAGNOSIS — M5441 Lumbago with sciatica, right side: Secondary | ICD-10-CM | POA: Diagnosis not present

## 2022-04-13 DIAGNOSIS — M542 Cervicalgia: Secondary | ICD-10-CM

## 2022-04-13 DIAGNOSIS — G8929 Other chronic pain: Secondary | ICD-10-CM | POA: Diagnosis not present

## 2022-04-13 DIAGNOSIS — R293 Abnormal posture: Secondary | ICD-10-CM

## 2022-04-13 DIAGNOSIS — M6281 Muscle weakness (generalized): Secondary | ICD-10-CM

## 2022-04-13 DIAGNOSIS — M25511 Pain in right shoulder: Secondary | ICD-10-CM | POA: Diagnosis not present

## 2022-04-13 NOTE — Therapy (Signed)
OUTPATIENT PHYSICAL THERAPY TREATMENT   Patient Name: Becky [name] MRN: [id number] DOB:03/06/1956, 66 y.o., female Today's Date: 04/13/2022   PT End of Session - 04/13/22 1314     Visit Number 11    Number of Visits 16    Date for PT Re-Evaluation 04/27/22    Authorization Type [employer]    Progress Note Due on Visit 16    PT Start Time 1315    PT Stop Time 1400    PT Time Calculation (min) 45 min    Activity Tolerance Patient tolerated treatment well    Behavior During Therapy WFL for tasks assessed/performed                Past Medical History:  Diagnosis Date   Abnormal ECG    Anemia    Anxiety    Cataract    . Cateracts bil eyes removed   GERD (gastroesophageal reflux disease)    Heart murmur    History of COVID-19 06/14/2020   Hypercholesterolemia    IDDM (insulin dependent diabetes mellitus) 1969   Migraine headache    Neuromuscular disorder (HCC)    bil legs & feet - neuropathy   Neuropathy    Osteoporosis    10/2012   Otosclerosis    Pigmented purpura ([place])    RA (rheumatoid arthritis) ([place]) 2000   S/P cardiac cath 01/2012   Normal coronaries and normal LV function.   Sjoegren syndrome    Past Surgical History:  Procedure Laterality Date   back ablation  03/2020   back ablation  10/09/2020   BREAST LUMPECTOMY  1995   CARPAL TUNNEL RELEASE  2000   CATARACT EXTRACTION Right 09-05-12   CATARACT EXTRACTION Left 3/15   CESAREAN SECTION  1987   COLONOSCOPY     EYE SURGERY     diabetic retinopathy    HYSTEROSCOPY  02/2001   and D & C   LYMPH NODE DISSECTION  2002   TOE SURGERY  2000   bone removed   Patient Active Problem List   Diagnosis Date Noted   Posterior vitreous detachment of left eye 09/15/2021   Dry eye syndrome, bilateral 09/15/2021   Scoliosis 07/24/2020   Spondylosis without myelopathy or radiculopathy, lumbar region 07/24/2020   Chronic bilateral low back pain without sciatica 07/24/2020   Posterior vitreous  detachment of right eye 03/18/2020   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 determined by examination associated with type 1 diabetes mellitus ([place]) 03/18/2020   Stable treated proliferative diabetic retinopathy of right eye determined by examination associated with type 2 diabetes mellitus ([place]) 03/18/2020   History of vitrectomy 24/26/8341   Complicated UTI (urinary tract infection) 12/13/2018   Failure of outpatient treatment 96/22/2979   Acute metabolic encephalopathy 89/21/1941   Fever 12/10/2018   At high risk for breast cancer 05/09/2018   Eosinophilia 01/31/2017   Iritis 01/04/2017   History of joint swelling 08/24/2016   Osteoarthritis of knees, bilateral 08/24/2016   History of migraine 08/24/2016   History of depression 08/24/2016   History of osteoporosis 08/24/2016   DDD (degenerative disc disease), lumbar 08/24/2016   High risk medication use 08/24/2016   Trigger finger, left middle finger 08/24/2016   Trigger finger, right ring finger 08/24/2016   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   Increased risk of breast cancer 07/06/2015   IDDM (insulin dependent diabetes mellitus) 07/06/2015   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 without organ or systems involvement ([place])  07/06/2015   Sjogren's syndrome ([place]) 07/06/2015   Genetic testing 05/12/2015   Family history of breast cancer 04/15/2015   Fibrocystic breast disease 10/11/2012   GERD 10/01/2009   Chest pain with moderate risk for cardiac etiology 09/02/2009   DYSPHAGIA 09/02/2009   ANXIETY 08/15/2009   ULCER-GASTRIC 08/15/2009   CONSTIPATION 08/15/2009   SLOW TRANSIT CONSTIPATION 08/15/2009   FLATULENCE ERUCTATION AND GAS PAIN 08/15/2009    PCP: Reynold Bowen, MD  REFERRING PROVIDER: Lorine Bears, NP  REFERRING DIAG: [phone] (ICD-10-CM) - Radiculopathy, cervical region M25.511,G89.29 (ICD-10-CM) - Chronic right shoulder pain M54.2 (ICD-10-CM) -  Cervicalgia M79.18 (ICD-10-CM) - Myofascial pain syndrome  THERAPY DIAG:  Cervicalgia  Chronic bilateral low back pain with right-sided sciatica  Chronic right shoulder pain  Abnormal posture  Muscle weakness (generalized)  Cramp and spasm  Rationale for Evaluation and Treatment Rehabilitation  ONSET DATE: several years  SUBJECTIVE:                                                                                                                                                                                                         SUBJECTIVE STATEMENT: Becky [name] feels much better today, almost no pain.  Very pleased with progress.      PERTINENT HISTORY:  Chronic low back pain with radiculopathy, myofascial trigger point injections every 6 weeks, back ablation, scoliosis, osteoporosis, RA, Type 1 DM, neuropathy, Sjogren's syndrome  PAIN:  Are you having pain? Yes: NPRS scale: 2/10 Pain location: localized to R side base of neck (levator scapulae)  Pain description: feels like a block hurts really bad  Are you having pain? Yes: NPRS scale: 0/10 Pain location: localized area in R side low back  Pain description: constant  Aggravating factors: worse in mornings Relieving factors: improves with movement, heat  PRECAUTIONS: None  WEIGHT BEARING RESTRICTIONS No  FALLS:  Has patient fallen in last 6 months? No  feels unsteady due to severe neuropathy in feet.   LIVING ENVIRONMENT: Lives with: lives with their spouse Lives in: House/apartment Stairs: No Has following equipment at home: None  OCCUPATION: retired,  walks 30 min daily  PLOF: Independent  PATIENT GOALS: improve neck and back pain.   OBJECTIVE:   DIAGNOSTIC FINDINGS:  MRI Cervical Spine 01/12/2022  IMPRESSION: 1. C4-C5 mild spinal canal stenosis and mild right neural foraminal narrowing. 2. C6-C7 moderate right and mild left neural foraminal narrowing. 3. C3-C4 mild right neural foraminal  narrowing. MRI Lumbar spine 01/12/2022  IMPRESSION: 1. L1-L2 mild right neural foraminal narrowing. Narrowing of the right  lateral recess at this level could affect the descending right L2 nerve roots. 2. Multilevel facet arthropathy, which can be a cause of back pain.   PATIENT SURVEYS:  Modified Oswestry 20/50= 40% moderate disability  NDI 24/50 = 48% moderate disability    COGNITION: Overall cognitive status: Within functional limits for tasks assessed   SENSATION: Severe neuropathy bil feet.   POSTURE: rounded shoulders and forward head - right shoulder forward more than left.   PALPATION: Hypomobility throughout cervical, upper thoracic, and lumbar spine.  Tenderness R UT, levator, suboccipitals and cervical erector spinae. Elevated R 1st rib, forward R shoulder.  Tenderness L2-3, bil QL (R>L), and lumbar multifidi.   CERVICAL ROM:   Active ROM A/PROM (deg) eval AROM  04/06/2022  Flexion 40 45  Extension 50 55  Right lateral flexion 20 20  Left lateral flexion 20 30  Right rotation 55 60  Left rotation 60 70   (Blank rows = not tested)  LUMBAR ROM:   Active  A/PROM  eval AROM 04/06/2022  Flexion To shins * Almost to ankles  Extension 90% limited * No pain, limited 50%  Right lateral flexion To knees  To  knees, slight increase in pain  Left lateral flexion To knees inc pain compared to R To knees  Right rotation WNL WNL  Left rotation WNL* WNL    (Blank rows = not tested)  *pain  UPPER EXTREMITY ROM:  Active ROM Right eval Left eval  Shoulder flexion 130* 135  Shoulder extension 55* 65  Shoulder abduction 115* 115  Shoulder adduction    Shoulder extension    Shoulder internal rotation functional low back * functional midback  Shoulder external rotation FER T1* FER t2   (Blank rows = not tested)  *pain  UPPER EXTREMITY MMT:  MMT Right* eval Left eval  Shoulder flexion 4+* 5  Shoulder extension    Shoulder abduction 5 5  Shoulder adduction 5  5  Shoulder extension    Shoulder internal rotation 5 5  Shoulder external rotation 5 5  Elbow flexion 5 5  Elbow extension 5 5  Wrist flexion 5 5  Wrist extension 5 5  Grip strength good good   (Blank rows = not tested) *pain  LOWER EXTREMITY MMT:    MMT Right eval Left eval  Hip flexion 5 5  Hip extension    Hip abduction 5 5  Hip adduction 5 5  Hip internal rotation    Hip external rotation    Knee flexion 5 5  Knee extension 5 5  Ankle dorsiflexion 5 5  Ankle plantarflexion     (Blank rows = not tested)    CERVICAL SPECIAL TESTS:  Spurling's test: Negative   FUNCTIONAL TESTS:  5 times sit to stand: 11.64 seconds without UE assist.    TODAY'S TREATMENT:  04/13/2022 Therapeutic Exercise: to improve strength and mobility.  Demo, verbal and tactile cues throughout for technique. - Seated Cervical Retraction  10 reps - Seated Shoulder Rolls  -10 reps - Seated Scapular Retraction -10 reps - 3 sec  hold - Cervical Extension AROM with pillowcase   - 10 reps - Seated Assisted Cervical Rotation with pillowcase  - 10 reps - Doorway Pec Stretch at 60 Elevation   - 3 reps - 15-30 sec hold - Pec Minor Stretch   - 3 reps - 15-30 sec  hold - Standing Thoracic Open Book at [place] Thoracic  Lumbar Rotation and Extension  - 10 reps bil On foam roller - pec stretch and back stroke x 10 bil  anual Therapy: to decrease muscle spasm and pain and improve mobility STM/TPR to bil cervical paraspinals, UT and levator, CPA and UPA mobs cervical spine grade 2-3;  04/09/2022 Manual Therapy: to decrease muscle spasm and pain and improve mobility STM/TPR to bil cervical paraspinals, UT and levator, CPA and UPA mobs cervical spine grade 2-3; pec stretches, UT stretch, scapular mobs.  Skilled palpation and monitoring during dry needling. Trigger Point Dry-Needling  Treatment instructions: Expect mild to moderate muscle soreness. S/S of pneumothorax if dry  needled over a lung field, and to seek immediate medical attention should they occur. Patient verbalized understanding of these instructions and education.  Patient Consent Given: Yes Education handout provided: Previously provided Muscles treated: bil Levator, bil UT Electrical stimulation performed: No Parameters: N/A Treatment response/outcome: Twitch Response Elicited and Palpable Increase in Muscle Length   04/06/2022 Therapeutic Exercise: to improve strength and mobility.  Demo, verbal and tactile cues throughout for technique. UBE L1 x 6 min ([id number]) Review HEP and piriformis stretches - corrected on form, instructed to perform LTR to non-painful side only.  Manual Therapy: to decrease muscle spasm and pain and improve mobility STM/TPR to R cervical paraspinals, UT and levator, R UPA mobs cervical spine grade 2-3; STM/TPR to R QL; skilled palpation and monitoring during dry needling. Trigger Point Dry-Needling  Treatment instructions: Expect mild to moderate muscle soreness. S/S of pneumothorax if dry needled over a lung field, and to seek immediate medical attention should they occur. Patient verbalized understanding of these instructions and education.  Patient Consent Given: Yes Education handout provided: Previously provided Muscles treated: R Levator, R UT, R QL Electrical stimulation performed: No Parameters: N/A Treatment response/outcome: Twitch Response Elicited and Palpable Increase in Muscle Length   03/30/2022 Therapeutic Exercise: to improve strength and mobility.  Demo, verbal and tactile cues throughout for technique. UBE L1 x 6 min ([id number]) Manual Therapy: to decrease muscle spasm and pain and improve mobility STM/TPR to bil cervical paraspinals, upper traps, levator scapulae.  UPA mobs cervical spine grade 2-3.  STM/TPR to bil lumbar paraspinals, UPA mobs lumbar spine grade 2-3; skilled palpation and monitoring during dry needling. Trigger Point Dry-Needling   Treatment instructions: Expect mild to moderate muscle soreness. S/S of pneumothorax if dry needled over a lung field, and to seek immediate medical attention should they occur. Patient verbalized understanding of these instructions and education.  Patient Consent Given: Yes Education handout provided: Previously provided Muscles treated: bil cervical multi C3-6, bil UT, L levator scapulae, bil lumbar multifidi L2-5.  Electrical stimulation performed: No Parameters: N/A Treatment response/outcome: Twitch Response Elicited and Palpable Increase in Muscle Length   PATIENT EDUCATION:  Education details: HEP update 04/13/2022 Person educated: Patient Education method: Explanation and Demonstration Education comprehension: verbalized understanding and returned demonstration   HOME EXERCISE PROGRAM: Access Code: [email or web address] URL: [email or web address] Date: 04/13/2022 Prepared by: ElGlenetta HewExercises - Supine Lower Trunk Rotation  - 1 x daily - 7 x weekly - 2 sets - 10 reps - Supine Posterior Pelvic Tilt  - 1 x daily - 7 x weekly - 2 sets - 10 reps - Static Prone on Elbows  - 1 x daily - 7 x weekly - Standing Bilateral Low Shoulder Row with Anchored Resistance  - 1 x daily - 7 x weekly - 2 sets - 10 reps - Shoulder Extension with Resistance  -  1 x daily - 7 x weekly - 2 sets - 10 reps - Supine Posterior Pelvic Tilt  - 1 x daily - 7 x weekly - 1 sets - 10 reps - 5 sec  hold - Supine Bridge  - 1 x daily - 7 x weekly - 3 sets - 10 reps - Bird Dog  - 1 x daily - 7 x weekly - 3 sets - 10 reps - Cat Cow  - 1 x daily - 7 x weekly - 3 sets - 10 reps - Seated Piriformis Stretch  - 1 x daily - 7 x weekly - 3 sets - 30 sec hold - Seated Piriformis Stretch with Trunk Bend  - 1 x daily - 7 x weekly - 3 sets - 30 sec hold - Seated Cervical Retraction  - 3 x daily - 7 x weekly - 1 sets - 10 reps - Gentle Levator Scapulae Stretch  - 1 x daily - 7 x weekly - 1 sets - 3 reps - 10 sec  hold - Seated Gentle Upper Trapezius Stretch  - 1 x daily - 7 x weekly - 1 sets - 3 reps - 10 sec  hold - Seated Shoulder Rolls  - 3 x daily - 7 x weekly - 1 sets - 10 reps - Seated Scapular Retraction  - 1 x daily - 7 x weekly - 1 sets - 10 reps - 3 sec  hold - Cervical Extension AROM with Strap  - 1 x daily - 7 x weekly - 1 sets - 10 reps - Seated Assisted Cervical Rotation with Towel  - 1 x daily - 7 x weekly - 1 sets - 10 reps - Doorway Pec Stretch at 60 Elevation  - 1 x daily - 7 x weekly - 1 sets - 3 reps - 15-30 sec hold - Pec Minor Stretch  - 1 x daily - 7 x weekly - 1 sets - 3 reps - 15-30 sec  hold - Standing Thoracic Open Book at [place]  - 1 x daily - 7 x weekly - 1 sets - 10 reps - Sidelying Open Book Thoracic Lumbar Rotation and Extension  - 1 x daily - 7 x weekly - 1 sets - 10 reps   ASSESSMENT:  CLINICAL IMPRESSION: Becky [name] reported significant improvement since last session, was able to focus almost entire session on progressing postural strengthening exercises with no complaint of pain.   Becky [name] continues to demonstrate potential for improvement and would benefit from continued skilled therapy to address impairments.     OBJECTIVE IMPAIRMENTS decreased activity tolerance, decreased balance, decreased endurance, decreased ROM, decreased strength, hypomobility, increased fascial restrictions, impaired perceived functional ability, increased muscle spasms, impaired flexibility, impaired UE functional use, improper body mechanics, postural dysfunction, and pain.   ACTIVITY LIMITATIONS carrying, lifting, bending, sleeping, bed mobility, dressing, and reach over head  PARTICIPATION LIMITATIONS: meal prep, cleaning, laundry, driving, shopping, community activity, and yard work  PERSONAL FACTORS 3+ comorbidities: Chronic low back pain, migraines, scoliosis, osteoporosis, RA, IDDM, neuropathy, Sjogren's syndrome  are also affecting patient's functional outcome.    REHAB POTENTIAL: Good  CLINICAL DECISION MAKING: Evolving/moderate complexity  EVALUATION COMPLEXITY: Moderate   GOALS: Goals reviewed with patient? Yes  SHORT TERM GOALS: Target date: 02/09/2022   Patient will be independent with initial HEP.  Baseline: given for back Goal status: MET   LONG TERM GOALS: Target date: 03/16/2022 extended to 04/27/2022  Patient will be independent with advanced/ongoing  HEP to improve outcomes and carryover.  Baseline: needs progression Goal status: IN PROGRESS  2.  Patient will report 75% improvement in neck pain when sleeping/waking to improve QOL.  Baseline: severe pain in mornings.  Goal status: MET  100% improvement, no pain in neck   3.  Patient will demonstrate full pain free cervical ROM for safety with driving.  Baseline: see objective Goal status: IN PROGRESS 04/06/22- see objective, improved, still some pain with L SB  4.  Patient will report <17/50 on NDI  to demonstrate improved functional ability. Baseline: 24/50 Goal status: IN PROGRESS  5.   Patient will report <14/50 on modified Oswestry to demonstrate improved QOL Baseline: 20/50 Goal status: IN PROGRESS  6.  Patient will report 75% improvement in LBP in mornings to improve QOL.    Baseline: 14/10 when wakes Goal status: IN PROGRESS  03/16/22- 10% improvement, not as severe in AM  7. Patient will demonstrate improved posture to decrease muscle imbalance. Baseline: forward head, rounded shoulders Goal status: IN PROGRESS  03/16/22- improving, less cues needed.     PLAN: PT FREQUENCY: 2x/week  PT DURATION: 8 weeks extended to 04/27/2022  PLANNED INTERVENTIONS: Therapeutic exercises, Therapeutic activity, Neuromuscular re-education, Balance training, Gait training, Patient/Family education, Self Care, Joint mobilization, Dry Needling, Electrical stimulation, Spinal mobilization, Cryotherapy, Moist heat, Traction, Ultrasound, Ionotophoresis 35m/ml Dexamethasone, Manual  therapy, and Re-evaluation  PLAN FOR NEXT SESSION: [place] and progress core strengthening exercises, manual therapy, dry needling, modalities PRN.   Add weight bearing/resistance training for osteopenia as appropriate.    ERennie Natter PT, DPT 04/13/2022, 6:26 PM

## 2022-04-20 ENCOUNTER — Encounter: Payer: Self-pay | Admitting: Physical Therapy

## 2022-04-20 ENCOUNTER — Ambulatory Visit: Payer: Medicare Other | Admitting: Physical Therapy

## 2022-04-20 DIAGNOSIS — M6281 Muscle weakness (generalized): Secondary | ICD-10-CM | POA: Diagnosis not present

## 2022-04-20 DIAGNOSIS — R252 Cramp and spasm: Secondary | ICD-10-CM | POA: Diagnosis not present

## 2022-04-20 DIAGNOSIS — M25511 Pain in right shoulder: Secondary | ICD-10-CM | POA: Diagnosis not present

## 2022-04-20 DIAGNOSIS — R293 Abnormal posture: Secondary | ICD-10-CM

## 2022-04-20 DIAGNOSIS — M5441 Lumbago with sciatica, right side: Secondary | ICD-10-CM | POA: Diagnosis not present

## 2022-04-20 DIAGNOSIS — M542 Cervicalgia: Secondary | ICD-10-CM

## 2022-04-20 DIAGNOSIS — G8929 Other chronic pain: Secondary | ICD-10-CM

## 2022-04-20 NOTE — Therapy (Addendum)
OUTPATIENT PHYSICAL THERAPY TREATMENT   Patient Name: Becky [name] MRN: [id number] DOB:09/11/1955, 66 y.o., female Today's Date: 04/20/2022   PT End of Session - 04/20/22 1320     Visit Number 12    Number of Visits 16    Date for PT Re-Evaluation 04/27/22    Authorization Type [employer]    Progress Note Due on Visit 16    PT Start Time 1317    PT Stop Time 1415    PT Time Calculation (min) 58 min    Activity Tolerance Patient tolerated treatment well    Behavior During Therapy WFL for tasks assessed/performed                Past Medical History:  Diagnosis Date   Abnormal ECG    Anemia    Anxiety    Cataract    . 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   Sjoegren syndrome    Past Surgical History:  Procedure Laterality Date   back ablation  03/2020   back ablation  10/09/2020   BREAST LUMPECTOMY  1995   CARPAL TUNNEL RELEASE  2000   CATARACT EXTRACTION Right 09-05-12   CATARACT EXTRACTION Left 3/15   CESAREAN SECTION  1987   COLONOSCOPY     EYE SURGERY     diabetic retinopathy    HYSTEROSCOPY  02/2001   and D & C   LYMPH NODE DISSECTION  2002   TOE SURGERY  2000   bone removed   Patient Active Problem List   Diagnosis Date Noted   Posterior vitreous detachment of left eye 09/15/2021   Dry eye syndrome, bilateral 09/15/2021   Scoliosis 07/24/2020   Spondylosis without myelopathy or radiculopathy, lumbar region 07/24/2020   Chronic bilateral low back pain without sciatica 07/24/2020   Posterior vitreous  detachment of right eye 03/18/2020   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 determined by examination associated with type 1 diabetes mellitus ([place]) 03/18/2020   Stable treated proliferative diabetic retinopathy of right eye determined by examination associated with type 2 diabetes mellitus ([place]) 03/18/2020   History of vitrectomy 81/06/7508   Complicated UTI (urinary tract infection) 12/13/2018   Failure of outpatient treatment 25/85/2778   Acute metabolic encephalopathy 24/23/5361   Fever 12/10/2018   At high risk for breast cancer 05/09/2018   Eosinophilia 01/31/2017   Iritis 01/04/2017   History of joint swelling 08/24/2016   Osteoarthritis of knees, bilateral 08/24/2016   History of migraine 08/24/2016   History of depression 08/24/2016   History of osteoporosis 08/24/2016   DDD (degenerative disc disease), lumbar 08/24/2016   High risk medication use 08/24/2016   Trigger finger, left middle finger 08/24/2016   Trigger finger, right ring finger 08/24/2016   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   Increased risk of breast cancer 07/06/2015   IDDM (insulin dependent diabetes mellitus) 07/06/2015   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 without organ or systems involvement ([place])  07/06/2015   Sjogren's syndrome ([place]) 07/06/2015   Genetic testing 05/12/2015   Family history of breast cancer 04/15/2015   Fibrocystic breast disease 10/11/2012   GERD 10/01/2009   Chest pain with moderate risk for cardiac etiology 09/02/2009   DYSPHAGIA 09/02/2009   ANXIETY 08/15/2009   ULCER-GASTRIC 08/15/2009   CONSTIPATION 08/15/2009   SLOW TRANSIT CONSTIPATION 08/15/2009   FLATULENCE ERUCTATION AND GAS PAIN 08/15/2009    PCP: Reynold Bowen, MD  REFERRING PROVIDER: Lorine Bears, NP  REFERRING DIAG: [phone] (ICD-10-CM) - Radiculopathy, cervical region M25.511,G89.29 (ICD-10-CM) - Chronic right shoulder pain M54.2 (ICD-10-CM) -  Cervicalgia M79.18 (ICD-10-CM) - Myofascial pain syndrome  THERAPY DIAG:  Cervicalgia  Chronic bilateral low back pain with right-sided sciatica  Chronic right shoulder pain  Abnormal posture  Muscle weakness (generalized)  Cramp and spasm  Rationale for Evaluation and Treatment Rehabilitation  ONSET DATE: several years  SUBJECTIVE:                                                                                                                                                                                                         SUBJECTIVE STATEMENT: Becky [name] reports she spent 4 days at a horse show in unheated ring (its was 40 and rainy), was very cold and increased neck/shoulder pain.        PERTINENT HISTORY:  Chronic low back pain with radiculopathy, myofascial trigger point injections every 6 weeks, back ablation, scoliosis, osteoporosis, RA, Type 1 DM, neuropathy, Sjogren's syndrome  PAIN:  Are you having pain? Yes: NPRS scale: 6/10 Pain location: neck/upper shoulders Pain description: achey  Are you having pain? Yes: NPRS scale: 0/10 Pain location: localized area in R side low back  Pain description: constant  Aggravating factors: worse in mornings Relieving factors: improves with movement, heat  PRECAUTIONS: None  WEIGHT BEARING RESTRICTIONS No  FALLS:  Has patient fallen in last 6 months? No  feels unsteady due to severe neuropathy in feet.   LIVING ENVIRONMENT: Lives with: lives with their spouse Lives in: House/apartment Stairs: No Has following equipment at home: None  OCCUPATION: retired,  walks 30 min daily  PLOF: Independent  PATIENT GOALS: improve neck and back pain.   OBJECTIVE:   DIAGNOSTIC FINDINGS:  MRI Cervical Spine 01/12/2022  IMPRESSION: 1. C4-C5 mild spinal canal stenosis and mild right neural foraminal narrowing. 2. C6-C7 moderate right and mild left neural foraminal narrowing. 3. C3-C4 mild right neural foraminal  narrowing. MRI Lumbar spine 01/12/2022  IMPRESSION: 1. L1-L2 mild right neural foraminal narrowing. Narrowing of the right  lateral recess at this level could affect the descending right L2 nerve roots. 2. Multilevel facet arthropathy, which can be a cause of back pain.   PATIENT SURVEYS:  Modified Oswestry 20/50= 40% moderate disability  NDI 24/50 = 48% moderate disability    COGNITION: Overall cognitive status: Within functional limits for tasks assessed   SENSATION: Severe neuropathy bil feet.   POSTURE: rounded shoulders and forward head - right shoulder forward more than left.   PALPATION: Hypomobility throughout cervical, upper thoracic, and lumbar spine.  Tenderness R UT, levator, suboccipitals and cervical erector spinae. Elevated R 1st rib, forward R shoulder.  Tenderness L2-3, bil QL (R>L), and lumbar multifidi.   CERVICAL ROM:   Active ROM A/PROM (deg) eval AROM  04/06/2022  Flexion 40 45  Extension 50 55  Right lateral flexion 20 20  Left lateral flexion 20 30  Right rotation 55 60  Left rotation 60 70   (Blank rows = not tested)  LUMBAR ROM:   Active  A/PROM  eval AROM 04/06/2022  Flexion To shins * Almost to ankles  Extension 90% limited * No pain, limited 50%  Right lateral flexion To knees  To  knees, slight increase in pain  Left lateral flexion To knees inc pain compared to R To knees  Right rotation WNL WNL  Left rotation WNL* WNL    (Blank rows = not tested)  *pain  UPPER EXTREMITY ROM:  Active ROM Right eval Left eval  Shoulder flexion 130* 135  Shoulder extension 55* 65  Shoulder abduction 115* 115  Shoulder adduction    Shoulder extension    Shoulder internal rotation functional low back * functional midback  Shoulder external rotation FER T1* FER t2   (Blank rows = not tested)  *pain  UPPER EXTREMITY MMT:  MMT Right* eval Left eval  Shoulder flexion 4+* 5  Shoulder extension    Shoulder abduction 5 5  Shoulder adduction 5  5  Shoulder extension    Shoulder internal rotation 5 5  Shoulder external rotation 5 5  Elbow flexion 5 5  Elbow extension 5 5  Wrist flexion 5 5  Wrist extension 5 5  Grip strength good good   (Blank rows = not tested) *pain  LOWER EXTREMITY MMT:    MMT Right eval Left eval  Hip flexion 5 5  Hip extension    Hip abduction 5 5  Hip adduction 5 5  Hip internal rotation    Hip external rotation    Knee flexion 5 5  Knee extension 5 5  Ankle dorsiflexion 5 5  Ankle plantarflexion     (Blank rows = not tested)    CERVICAL SPECIAL TESTS:  Spurling's test: Negative   FUNCTIONAL TESTS:  5 times sit to stand: 11.64 seconds without UE assist.    TODAY'S TREATMENT:  04/20/2022 Therapeutic Exercise: to improve strength and mobility.  Demo, verbal and tactile cues throughout for technique. Nustep L5 x 6 min  UT and L/S stretches Manual Therapy: to decrease muscle spasm and pain and improve mobility. STM/TPR to bil cervical paraspinals, UT and levator, CPA and UPA mobs cervical spine grade 2-3; pec stretches, UT stretch, scapular mobs.  Skilled palpation and monitoring during dry needling. Trigger Point Dry-Needling  Treatment instructions: Expect mild to moderate muscle soreness. S/S of pneumothorax if dry needled over a lung field, and to seek immediate medical attention should they occur. Patient verbalized understanding of these instructions and education.  Patient Consent Given: Yes  Education handout provided: Previously provided Muscles treated: bil Levator, bil UT, Bil cervical multifidi C4-6 Electrical stimulation performed: No Parameters: N/A Treatment response/outcome: Twitch Response Elicited and Palpable Increase in Muscle Length Modalities: MHP to neck and back to decrease tightness x 10 min    04/13/2022 Therapeutic Exercise: to improve strength and mobility.  Demo, verbal and tactile cues throughout for technique. - Seated Cervical Retraction  10 reps -  Seated Shoulder Rolls  -10 reps - Seated Scapular Retraction -10 reps - 3 sec  hold - Cervical Extension AROM with pillowcase   - 10 reps - Seated Assisted Cervical Rotation with pillowcase  - 10 reps - Doorway Pec Stretch at 60 Elevation   - 3 reps - 15-30 sec hold - Pec Minor Stretch   - 3 reps - 15-30 sec  hold - Standing Thoracic Open Book at [place]   - 10 reps bil - Sidelying Open Book Thoracic Lumbar Rotation and Extension  - 10 reps bil On foam roller - pec stretch and back stroke x 10 bil  anual Therapy: to decrease muscle spasm and pain and improve mobility STM/TPR to bil cervical paraspinals, UT and levator, CPA and UPA mobs cervical spine grade 2-3;  04/09/2022 Manual Therapy: to decrease muscle spasm and pain and improve mobility STM/TPR to bil cervical paraspinals, UT and levator, CPA and UPA mobs cervical spine grade 2-3; pec stretches, UT stretch, scapular mobs.  Skilled palpation and monitoring during dry needling. Trigger Point Dry-Needling  Treatment instructions: Expect mild to moderate muscle soreness. S/S of pneumothorax if dry needled over a lung field, and to seek immediate medical attention should they occur. Patient verbalized understanding of these instructions and education.  Patient Consent Given: Yes Education handout provided: Previously provided Muscles treated: bil Levator, bil UT Electrical stimulation performed: No Parameters: N/A Treatment response/outcome: Twitch Response Elicited and Palpable Increase in Muscle Length    PATIENT EDUCATION:  Education details: HEP update 04/13/2022 Person educated: Patient Education method: Explanation and Demonstration Education comprehension: verbalized understanding and returned demonstration   HOME EXERCISE PROGRAM: Access Code: [email or web address] URL: [email or web address] Date: 04/13/2022 Prepared by: Glenetta Hew  Exercises - Supine Lower Trunk Rotation  - 1 x daily - 7 x weekly - 2 sets - 10  reps - Supine Posterior Pelvic Tilt  - 1 x daily - 7 x weekly - 2 sets - 10 reps - Static Prone on Elbows  - 1 x daily - 7 x weekly - Standing Bilateral Low Shoulder Row with Anchored Resistance  - 1 x daily - 7 x weekly - 2 sets - 10 reps - Shoulder Extension with Resistance  - 1 x daily - 7 x weekly - 2 sets - 10 reps - Supine Posterior Pelvic Tilt  - 1 x daily - 7 x weekly - 1 sets - 10 reps - 5 sec  hold - Supine Bridge  - 1 x daily - 7 x weekly - 3 sets - 10 reps - Bird Dog  - 1 x daily - 7 x weekly - 3 sets - 10 reps - Cat Cow  - 1 x daily - 7 x weekly - 3 sets - 10 reps - Seated Piriformis Stretch  - 1 x daily - 7 x weekly - 3 sets - 30 sec hold - Seated Piriformis Stretch with Trunk Bend  - 1 x daily - 7 x weekly - 3 sets - 30 sec hold - Seated Cervical Retraction  - 3 x daily - 7  x weekly - 1 sets - 10 reps - Gentle Levator Scapulae Stretch  - 1 x daily - 7 x weekly - 1 sets - 3 reps - 10 sec hold - Seated Gentle Upper Trapezius Stretch  - 1 x daily - 7 x weekly - 1 sets - 3 reps - 10 sec  hold - Seated Shoulder Rolls  - 3 x daily - 7 x weekly - 1 sets - 10 reps - Seated Scapular Retraction  - 1 x daily - 7 x weekly - 1 sets - 10 reps - 3 sec  hold - Cervical Extension AROM with Strap  - 1 x daily - 7 x weekly - 1 sets - 10 reps - Seated Assisted Cervical Rotation with Towel  - 1 x daily - 7 x weekly - 1 sets - 10 reps - Doorway Pec Stretch at 60 Elevation  - 1 x daily - 7 x weekly - 1 sets - 3 reps - 15-30 sec hold - Pec Minor Stretch  - 1 x daily - 7 x weekly - 1 sets - 3 reps - 15-30 sec  hold - Standing Thoracic Open Book at [place]  - 1 x daily - 7 x weekly - 1 sets - 10 reps - Sidelying Open Book Thoracic Lumbar Rotation and Extension  - 1 x daily - 7 x weekly - 1 sets - 10 reps   ASSESSMENT:  CLINICAL IMPRESSION: Becky [name] reports increased neck/UT pain today after uncomfortable weekend.  She responded well to interventions and reported decreased pain.  Noted continues  to have significant pectoralis tightness, will address next session.   Becky [name] continues to demonstrate potential for improvement and would benefit from continued skilled therapy to address impairments.     OBJECTIVE IMPAIRMENTS decreased activity tolerance, decreased balance, decreased endurance, decreased ROM, decreased strength, hypomobility, increased fascial restrictions, impaired perceived functional ability, increased muscle spasms, impaired flexibility, impaired UE functional use, improper body mechanics, postural dysfunction, and pain.   ACTIVITY LIMITATIONS carrying, lifting, bending, sleeping, bed mobility, dressing, and reach over head  PARTICIPATION LIMITATIONS: meal prep, cleaning, laundry, driving, shopping, community activity, and yard work  PERSONAL FACTORS 3+ comorbidities: Chronic low back pain, migraines, scoliosis, osteoporosis, RA, IDDM, neuropathy, Sjogren's syndrome  are also affecting patient's functional outcome.   REHAB POTENTIAL: Good  CLINICAL DECISION MAKING: Evolving/moderate complexity  EVALUATION COMPLEXITY: Moderate   GOALS: Goals reviewed with patient? Yes  SHORT TERM GOALS: Target date: 02/09/2022   Patient will be independent with initial HEP.  Baseline: given for back Goal status: MET   LONG TERM GOALS: Target date: 03/16/2022 extended to 04/27/2022  Patient will be independent with advanced/ongoing HEP to improve outcomes and carryover.  Baseline: needs progression Goal status: IN PROGRESS  2.  Patient will report 75% improvement in neck pain when sleeping/waking to improve QOL.  Baseline: severe pain in mornings.  Goal status: MET  100% improvement, no pain in neck   3.  Patient will demonstrate full pain free cervical ROM for safety with driving.  Baseline: see objective Goal status: IN PROGRESS 04/06/22- see objective, improved, still some pain with L SB  4.  Patient will report <17/50 on NDI  to demonstrate improved functional  ability. Baseline: 24/50 Goal status: IN PROGRESS  5.   Patient will report <14/50 on modified Oswestry to demonstrate improved QOL Baseline: 20/50 Goal status: IN PROGRESS  6.  Patient will report 75% improvement in LBP in mornings to improve QOL.  Baseline: 14/10 when wakes Goal status: IN PROGRESS  03/16/22- 10% improvement, not as severe in AM  7. Patient will demonstrate improved posture to decrease muscle imbalance. Baseline: forward head, rounded shoulders Goal status: IN PROGRESS  03/16/22- improving, less cues needed.     PLAN: PT FREQUENCY: 2x/week  PT DURATION: 8 weeks extended to 04/27/2022  PLANNED INTERVENTIONS: Therapeutic exercises, Therapeutic activity, Neuromuscular re-education, Balance training, Gait training, Patient/Family education, Self Care, Joint mobilization, Dry Needling, Electrical stimulation, Spinal mobilization, Cryotherapy, Moist heat, Traction, Ultrasound, Ionotophoresis 60m/ml Dexamethasone, Manual therapy, and Re-evaluation  PLAN FOR NEXT SESSION: [place] and progress core strengthening exercises, manual therapy, dry needling, modalities PRN.   Add weight bearing/resistance training for osteopenia as appropriate.   Try DN to bil pecs to decrease tightness.    ERennie Natter PT, DPT 04/20/2022, 2:14 PM

## 2022-04-22 ENCOUNTER — Ambulatory Visit: Payer: Medicare Other | Attending: Physical Medicine and Rehabilitation | Admitting: Physical Therapy

## 2022-04-22 ENCOUNTER — Encounter: Payer: Self-pay | Admitting: Physical Therapy

## 2022-04-22 DIAGNOSIS — M5441 Lumbago with sciatica, right side: Secondary | ICD-10-CM | POA: Diagnosis not present

## 2022-04-22 DIAGNOSIS — M25511 Pain in right shoulder: Secondary | ICD-10-CM | POA: Insufficient documentation

## 2022-04-22 DIAGNOSIS — R293 Abnormal posture: Secondary | ICD-10-CM | POA: Insufficient documentation

## 2022-04-22 DIAGNOSIS — M542 Cervicalgia: Secondary | ICD-10-CM | POA: Diagnosis not present

## 2022-04-22 DIAGNOSIS — G8929 Other chronic pain: Secondary | ICD-10-CM | POA: Insufficient documentation

## 2022-04-22 DIAGNOSIS — M6281 Muscle weakness (generalized): Secondary | ICD-10-CM | POA: Insufficient documentation

## 2022-04-22 DIAGNOSIS — R252 Cramp and spasm: Secondary | ICD-10-CM | POA: Diagnosis not present

## 2022-04-22 NOTE — Therapy (Signed)
OUTPATIENT PHYSICAL THERAPY TREATMENT   Patient Name: Becky [name] MRN: [id number] DOB:1956/02/19, 66 y.o., female Today's Date: 04/22/2022   PT End of Session - 04/22/22 1021     Visit Number 13    Number of Visits 16    Date for PT Re-Evaluation 04/27/22    Authorization Type [employer]    Progress Note Due on Visit 16    PT Start Time 1020    PT Stop Time 1102    PT Time Calculation (min) 42 min    Activity Tolerance Patient tolerated treatment well    Behavior During Therapy WFL for tasks assessed/performed                Past Medical History:  Diagnosis Date   Abnormal ECG    Anemia    Anxiety    Cataract    . Cateracts bil eyes removed   GERD (gastroesophageal reflux disease)    Heart murmur    History of COVID-19 06/14/2020   Hypercholesterolemia    IDDM (insulin dependent diabetes mellitus) 1969   Migraine headache    Neuromuscular disorder (HCC)    bil legs & feet - neuropathy   Neuropathy    Osteoporosis    10/2012   Otosclerosis    Pigmented purpura ([place])    RA (rheumatoid arthritis) ([place]) 2000   S/P cardiac cath 01/2012   Normal coronaries and normal LV function.   Sjoegren syndrome    Past Surgical History:  Procedure Laterality Date   back ablation  03/2020   back ablation  10/09/2020   BREAST LUMPECTOMY  1995   CARPAL TUNNEL RELEASE  2000   CATARACT EXTRACTION Right 09-05-12   CATARACT EXTRACTION Left 3/15   CESAREAN SECTION  1987   COLONOSCOPY     EYE SURGERY     diabetic retinopathy    HYSTEROSCOPY  02/2001   and D & C   LYMPH NODE DISSECTION  2002   TOE SURGERY  2000   bone removed   Patient Active Problem List   Diagnosis Date Noted   Posterior vitreous detachment of left eye 09/15/2021   Dry eye syndrome, bilateral 09/15/2021   Scoliosis 07/24/2020   Spondylosis without myelopathy or radiculopathy, lumbar region 07/24/2020   Chronic bilateral low back pain without sciatica 07/24/2020   Posterior vitreous  detachment of right eye 03/18/2020   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 determined by examination associated with type 1 diabetes mellitus ([place]) 03/18/2020   Stable treated proliferative diabetic retinopathy of right eye determined by examination associated with type 2 diabetes mellitus ([place]) 03/18/2020   History of vitrectomy 76/19/5093   Complicated UTI (urinary tract infection) 12/13/2018   Failure of outpatient treatment 26/71/2458   Acute metabolic encephalopathy 09/98/3382   Fever 12/10/2018   At high risk for breast cancer 05/09/2018   Eosinophilia 01/31/2017   Iritis 01/04/2017   History of joint swelling 08/24/2016   Osteoarthritis of knees, bilateral 08/24/2016   History of migraine 08/24/2016   History of depression 08/24/2016   History of osteoporosis 08/24/2016   DDD (degenerative disc disease), lumbar 08/24/2016   High risk medication use 08/24/2016   Trigger finger, left middle finger 08/24/2016   Trigger finger, right ring finger 08/24/2016   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   Increased risk of breast cancer 07/06/2015   IDDM (insulin dependent diabetes mellitus) 07/06/2015   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 without organ or systems involvement ([place])  07/06/2015   Sjogren's syndrome ([place]) 07/06/2015   Genetic testing 05/12/2015   Family history of breast cancer 04/15/2015   Fibrocystic breast disease 10/11/2012   GERD 10/01/2009   Chest pain with moderate risk for cardiac etiology 09/02/2009   DYSPHAGIA 09/02/2009   ANXIETY 08/15/2009   ULCER-GASTRIC 08/15/2009   CONSTIPATION 08/15/2009   SLOW TRANSIT CONSTIPATION 08/15/2009   FLATULENCE ERUCTATION AND GAS PAIN 08/15/2009    PCP: Reynold Bowen, MD  REFERRING PROVIDER: Lorine Bears, NP  REFERRING DIAG: [phone] (ICD-10-CM) - Radiculopathy, cervical region M25.511,G89.29 (ICD-10-CM) - Chronic right shoulder pain M54.2 (ICD-10-CM) -  Cervicalgia M79.18 (ICD-10-CM) - Myofascial pain syndrome  THERAPY DIAG:  Cervicalgia  Chronic bilateral low back pain with right-sided sciatica  Chronic right shoulder pain  Abnormal posture  Muscle weakness (generalized)  Cramp and spasm  Rationale for Evaluation and Treatment Rehabilitation  ONSET DATE: several years  SUBJECTIVE:                                                                                                                                                                                                         SUBJECTIVE STATEMENT: Becky [name] reports she is doing well, has pain in R shoulder blade and R low back, but pain is very localized today.  The rest of her neck feels great.   Compliant with exercises, in morning does then with MHP on neck.       PERTINENT HISTORY:  Chronic low back pain with radiculopathy, myofascial trigger point injections every 6 weeks, back ablation, scoliosis, osteoporosis, RA, Type 1 DM, neuropathy, Sjogren's syndrome  PAIN:  Are you having pain? Yes: NPRS scale: 5/10 Pain location: R levator scapulae distal attachment Pain description: achey  Are you having pain? Yes: NPRS scale: 5/10 Pain location: localized area in R side low back  Pain description: constant  Aggravating factors: worse in mornings Relieving factors: improves with movement, heat  PRECAUTIONS: None  WEIGHT BEARING RESTRICTIONS No  FALLS:  Has patient fallen in last 6 months? No  feels unsteady due to severe neuropathy in feet.   LIVING ENVIRONMENT: Lives with: lives with their spouse Lives in: House/apartment Stairs: No Has following equipment at home: None  OCCUPATION: retired,  walks 30 min daily  PLOF: Independent  PATIENT GOALS: improve neck and back pain.   OBJECTIVE:   DIAGNOSTIC FINDINGS:  MRI Cervical Spine 01/12/2022  IMPRESSION: 1. C4-C5 mild spinal canal stenosis and mild right neural foraminal narrowing. 2. C6-C7  moderate right and mild left neural foraminal narrowing. 3. C3-C4 mild right  neural foraminal narrowing. MRI Lumbar spine 01/12/2022  IMPRESSION: 1. L1-L2 mild right neural foraminal narrowing. Narrowing of the right lateral recess at this level could affect the descending right L2 nerve roots. 2. Multilevel facet arthropathy, which can be a cause of back pain.   PATIENT SURVEYS:  Modified Oswestry 20/50= 40% moderate disability  NDI 24/50 = 48% moderate disability    COGNITION: Overall cognitive status: Within functional limits for tasks assessed   SENSATION: Severe neuropathy bil feet.   POSTURE: rounded shoulders and forward head - right shoulder forward more than left.   PALPATION: Hypomobility throughout cervical, upper thoracic, and lumbar spine.  Tenderness R UT, levator, suboccipitals and cervical erector spinae. Elevated R 1st rib, forward R shoulder.  Tenderness L2-3, bil QL (R>L), and lumbar multifidi.   CERVICAL ROM:   Active ROM A/PROM (deg) eval AROM  04/06/2022  Flexion 40 45  Extension 50 55  Right lateral flexion 20 20  Left lateral flexion 20 30  Right rotation 55 60  Left rotation 60 70   (Blank rows = not tested)  LUMBAR ROM:   Active  A/PROM  eval AROM 04/06/2022  Flexion To shins * Almost to ankles  Extension 90% limited * No pain, limited 50%  Right lateral flexion To knees  To  knees, slight increase in pain  Left lateral flexion To knees inc pain compared to R To knees  Right rotation WNL WNL  Left rotation WNL* WNL    (Blank rows = not tested)  *pain  UPPER EXTREMITY ROM:  Active ROM Right eval Left eval  Shoulder flexion 130* 135  Shoulder extension 55* 65  Shoulder abduction 115* 115  Shoulder adduction    Shoulder extension    Shoulder internal rotation functional low back * functional midback  Shoulder external rotation FER T1* FER t2   (Blank rows = not tested)  *pain  UPPER EXTREMITY MMT:  MMT Right* eval Left eval   Shoulder flexion 4+* 5  Shoulder extension    Shoulder abduction 5 5  Shoulder adduction 5 5  Shoulder extension    Shoulder internal rotation 5 5  Shoulder external rotation 5 5  Elbow flexion 5 5  Elbow extension 5 5  Wrist flexion 5 5  Wrist extension 5 5  Grip strength good good   (Blank rows = not tested) *pain  LOWER EXTREMITY MMT:    MMT Right eval Left eval  Hip flexion 5 5  Hip extension    Hip abduction 5 5  Hip adduction 5 5  Hip internal rotation    Hip external rotation    Knee flexion 5 5  Knee extension 5 5  Ankle dorsiflexion 5 5  Ankle plantarflexion     (Blank rows = not tested)    CERVICAL SPECIAL TESTS:  Spurling's test: Negative   FUNCTIONAL TESTS:  5 times sit to stand: 11.64 seconds without UE assist.    TODAY'S TREATMENT:  04/22/2022 Therapeutic Exercise: to improve strength and mobility.  Demo, verbal and tactile cues throughout for technique. UBE L2 x 6 min  Active release R levator pin and stretch with pt. Performing L/S stretch 5 x 10 sec hold Manual Therapy: to decrease muscle spasm and pain and improve mobility. STM/TPR to R levator/UT, bil pecs, R QL Skilled palpation and monitoring during dry needling. Trigger Point Dry-Needling  Treatment instructions: Expect mild to moderate muscle soreness. S/S of pneumothorax if dry needled over a lung field, and to seek immediate  medical attention should they occur. Patient verbalized understanding of these instructions and education.  Patient Consent Given: Yes Education handout provided: Previously provided Muscles treated: R levator, R QL, bil pecs Electrical stimulation performed: No Parameters: N/A Treatment response/outcome: Twitch Response Elicited and Palpable Increase in Muscle Length   04/20/2022 Therapeutic Exercise: to improve strength and mobility.  Demo, verbal and tactile cues throughout for technique. Nustep L5 x 6 min  UT and L/S stretches Manual Therapy: to decrease  muscle spasm and pain and improve mobility. STM/TPR to bil cervical paraspinals, UT and levator, CPA and UPA mobs cervical spine grade 2-3; pec stretches, UT stretch, scapular mobs.  Skilled palpation and monitoring during dry needling. Trigger Point Dry-Needling  Treatment instructions: Expect mild to moderate muscle soreness. S/S of pneumothorax if dry needled over a lung field, and to seek immediate medical attention should they occur. Patient verbalized understanding of these instructions and education.  Patient Consent Given: Yes Education handout provided: Previously provided Muscles treated: bil Levator, bil UT, Bil cervical multifidi C4-6 Electrical stimulation performed: No Parameters: N/A Treatment response/outcome: Twitch Response Elicited and Palpable Increase in Muscle Length Modalities: MHP to neck and back to decrease tightness x 10 min    04/13/2022 Therapeutic Exercise: to improve strength and mobility.  Demo, verbal and tactile cues throughout for technique. - Seated Cervical Retraction  10 reps - Seated Shoulder Rolls  -10 reps - Seated Scapular Retraction -10 reps - 3 sec  hold - Cervical Extension AROM with pillowcase   - 10 reps - Seated Assisted Cervical Rotation with pillowcase  - 10 reps - Doorway Pec Stretch at 60 Elevation   - 3 reps - 15-30 sec hold - Pec Minor Stretch   - 3 reps - 15-30 sec  hold - Standing Thoracic Open Book at [place]   - 10 reps bil - Sidelying Open Book Thoracic Lumbar Rotation and Extension  - 10 reps bil On foam roller - pec stretch and back stroke x 10 bil  Manual Therapy: to decrease muscle spasm and pain and improve mobility STM/TPR to bil cervical paraspinals, UT and levator, CPA and UPA mobs cervical spine grade 2-3;   PATIENT EDUCATION:  Education details: HEP update 04/13/2022 Person educated: Patient Education method: Explanation and Demonstration Education comprehension: verbalized understanding and returned  demonstration   HOME EXERCISE PROGRAM: Access Code: [email or web address] URL: [email or web address] Date: 04/13/2022 Prepared by: Glenetta Hew  Exercises - Supine Lower Trunk Rotation  - 1 x daily - 7 x weekly - 2 sets - 10 reps - Supine Posterior Pelvic Tilt  - 1 x daily - 7 x weekly - 2 sets - 10 reps - Static Prone on Elbows  - 1 x daily - 7 x weekly - Standing Bilateral Low Shoulder Row with Anchored Resistance  - 1 x daily - 7 x weekly - 2 sets - 10 reps - Shoulder Extension with Resistance  - 1 x daily - 7 x weekly - 2 sets - 10 reps - Supine Posterior Pelvic Tilt  - 1 x daily - 7 x weekly - 1 sets - 10 reps - 5 sec  hold - Supine Bridge  - 1 x daily - 7 x weekly - 3 sets - 10 reps - Bird Dog  - 1 x daily - 7 x weekly - 3 sets - 10 reps - Cat Cow  - 1 x daily - 7 x weekly - 3 sets - 10 reps - Seated Piriformis Stretch  - 1 x  daily - 7 x weekly - 3 sets - 30 sec hold - Seated Piriformis Stretch with Trunk Bend  - 1 x daily - 7 x weekly - 3 sets - 30 sec hold - Seated Cervical Retraction  - 3 x daily - 7 x weekly - 1 sets - 10 reps - Gentle Levator Scapulae Stretch  - 1 x daily - 7 x weekly - 1 sets - 3 reps - 10 sec hold - Seated Gentle Upper Trapezius Stretch  - 1 x daily - 7 x weekly - 1 sets - 3 reps - 10 sec  hold - Seated Shoulder Rolls  - 3 x daily - 7 x weekly - 1 sets - 10 reps - Seated Scapular Retraction  - 1 x daily - 7 x weekly - 1 sets - 10 reps - 3 sec  hold - Cervical Extension AROM with Strap  - 1 x daily - 7 x weekly - 1 sets - 10 reps - Seated Assisted Cervical Rotation with Towel  - 1 x daily - 7 x weekly - 1 sets - 10 reps - Doorway Pec Stretch at 60 Elevation  - 1 x daily - 7 x weekly - 1 sets - 3 reps - 15-30 sec hold - Pec Minor Stretch  - 1 x daily - 7 x weekly - 1 sets - 3 reps - 15-30 sec  hold - Standing Thoracic Open Book at [place] 1 x daily - 7 x weekly - 1 sets - 10 reps - Sidelying Open Book Thoracic Lumbar Rotation and Extension  - 1 x daily  - 7 x weekly - 1 sets - 10 reps   ASSESSMENT:  CLINICAL IMPRESSION: Becky [name] continues to report improved pain, with pain now very localized to R LS and R QL, responded well to manual therapy and reported decreased pain after interventions.  Also performed TrDN to bil pecs today as these are still very tight despite regular stretching.  Tolerated well.    Becky [name] continues to demonstrate potential for improvement and would benefit from continued skilled therapy to address impairments.     OBJECTIVE IMPAIRMENTS decreased activity tolerance, decreased balance, decreased endurance, decreased ROM, decreased strength, hypomobility, increased fascial restrictions, impaired perceived functional ability, increased muscle spasms, impaired flexibility, impaired UE functional use, improper body mechanics, postural dysfunction, and pain.   ACTIVITY LIMITATIONS carrying, lifting, bending, sleeping, bed mobility, dressing, and reach over head  PARTICIPATION LIMITATIONS: meal prep, cleaning, laundry, driving, shopping, community activity, and yard work  PERSONAL FACTORS 3+ comorbidities: Chronic low back pain, migraines, scoliosis, osteoporosis, RA, IDDM, neuropathy, Sjogren's syndrome  are also affecting patient's functional outcome.   REHAB POTENTIAL: Good  CLINICAL DECISION MAKING: Evolving/moderate complexity  EVALUATION COMPLEXITY: Moderate   GOALS: Goals reviewed with patient? Yes  SHORT TERM GOALS: Target date: 02/09/2022   Patient will be independent with initial HEP.  Baseline: given for back Goal status: MET   LONG TERM GOALS: Target date: 03/16/2022 extended to 04/27/2022  Patient will be independent with advanced/ongoing HEP to improve outcomes and carryover.  Baseline: needs progression Goal status: IN PROGRESS  2.  Patient will report 75% improvement in neck pain when sleeping/waking to improve QOL.  Baseline: severe pain in mornings.  Goal status: MET  100%  improvement, no pain in neck   3.  Patient will demonstrate full pain free cervical ROM for safety with driving.  Baseline: see objective Goal status: IN PROGRESS 04/06/22- see objective, improved,  still some pain with L SB  4.  Patient will report <17/50 on NDI  to demonstrate improved functional ability. Baseline: 24/50 Goal status: IN PROGRESS  5.   Patient will report <14/50 on modified Oswestry to demonstrate improved QOL Baseline: 20/50 Goal status: IN PROGRESS  6.  Patient will report 75% improvement in LBP in mornings to improve QOL.    Baseline: 14/10 when wakes Goal status: IN PROGRESS  03/16/22- 10% improvement, not as severe in AM  7. Patient will demonstrate improved posture to decrease muscle imbalance. Baseline: forward head, rounded shoulders Goal status: IN PROGRESS  03/16/22- improving, less cues needed.     PLAN: PT FREQUENCY: 2x/week  PT DURATION: 8 weeks extended to 04/27/2022  PLANNED INTERVENTIONS: Therapeutic exercises, Therapeutic activity, Neuromuscular re-education, Balance training, Gait training, Patient/Family education, Self Care, Joint mobilization, Dry Needling, Electrical stimulation, Spinal mobilization, Cryotherapy, Moist heat, Traction, Ultrasound, Ionotophoresis 40m/ml Dexamethasone, Manual therapy, and Re-evaluation  PLAN FOR NEXT SESSION:  Add weight bearing/resistance training for osteopenia as appropriate.   Recert .  ERennie Natter PT, DPT 04/22/2022, 12:01 PM

## 2022-04-27 ENCOUNTER — Ambulatory Visit: Payer: Medicare Other | Admitting: Physical Therapy

## 2022-04-27 ENCOUNTER — Encounter: Payer: Self-pay | Admitting: Physical Therapy

## 2022-04-27 DIAGNOSIS — R252 Cramp and spasm: Secondary | ICD-10-CM | POA: Diagnosis not present

## 2022-04-27 DIAGNOSIS — G8929 Other chronic pain: Secondary | ICD-10-CM

## 2022-04-27 DIAGNOSIS — M25511 Pain in right shoulder: Secondary | ICD-10-CM | POA: Diagnosis not present

## 2022-04-27 DIAGNOSIS — M542 Cervicalgia: Secondary | ICD-10-CM

## 2022-04-27 DIAGNOSIS — R293 Abnormal posture: Secondary | ICD-10-CM | POA: Diagnosis not present

## 2022-04-27 DIAGNOSIS — M6281 Muscle weakness (generalized): Secondary | ICD-10-CM

## 2022-04-27 DIAGNOSIS — M5441 Lumbago with sciatica, right side: Secondary | ICD-10-CM | POA: Diagnosis not present

## 2022-04-27 NOTE — Therapy (Signed)
OUTPATIENT PHYSICAL THERAPY TREATMENT  Progress Note Reporting Period 03/16/2022 to 04/27/2022  See note below for Objective Data and Assessment of Progress/Goals.     Patient Name: Becky [name] MRN: [id number] DOB:1956/02/26, 66 y.o., female Today's Date: 04/27/2022        Past Medical History:  Diagnosis Date   Abnormal ECG    Anemia    Anxiety    Cataract    . Cateracts bil eyes removed   GERD (gastroesophageal reflux disease)    Heart murmur    History of COVID-19 06/14/2020   Hypercholesterolemia    IDDM (insulin dependent diabetes mellitus) 1969   Migraine headache    Neuromuscular disorder (HCC)    bil legs & feet - neuropathy   Neuropathy    Osteoporosis    10/2012   Otosclerosis    Pigmented purpura ([place])    RA (rheumatoid arthritis) ([place]) 2000   S/P cardiac cath 01/2012   Normal coronaries and normal LV function.   Sjoegren syndrome    Past Surgical History:  Procedure Laterality Date   back ablation  03/2020   back ablation  10/09/2020   BREAST LUMPECTOMY  1995   CARPAL TUNNEL RELEASE  2000   CATARACT EXTRACTION Right 09-05-12   CATARACT EXTRACTION Left 3/15   CESAREAN SECTION  1987   COLONOSCOPY     EYE SURGERY     diabetic retinopathy    HYSTEROSCOPY  02/2001   and D & C   LYMPH NODE DISSECTION  2002   TOE SURGERY  2000   bone removed   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 determined by examination associated with type 1 diabetes mellitus ([place]) 03/18/2020   Stable treated proliferative diabetic retinopathy of right eye determined  by examination associated with type 2 diabetes mellitus ([place]) 03/18/2020   History of vitrectomy 38/75/6433   Complicated UTI (urinary tract infection) 12/13/2018   Failure of outpatient treatment 29/51/8841   Acute metabolic encephalopathy 66/11/3014   Fever 12/10/2018   At high risk for breast cancer 05/09/2018   Eosinophilia 01/31/2017   Iritis 01/04/2017   History of joint swelling 08/24/2016   Osteoarthritis of knees, bilateral 08/24/2016   History of migraine 08/24/2016   History of depression 08/24/2016   History of osteoporosis 08/24/2016   DDD (degenerative disc disease), lumbar 08/24/2016   High risk medication use 08/24/2016   Trigger finger, left middle finger 08/24/2016   Trigger finger, right ring finger 08/24/2016   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   Increased risk of breast cancer 07/06/2015   IDDM (insulin dependent diabetes mellitus) 07/06/2015   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 without organ or systems involvement ([place]) 07/06/2015   Sjogren's syndrome ([place]) 07/06/2015   Genetic testing 05/12/2015   Family history of breast cancer 04/15/2015   Fibrocystic breast disease 10/11/2012   GERD 10/01/2009   Chest pain with moderate risk for cardiac etiology 09/02/2009   DYSPHAGIA 09/02/2009   ANXIETY 08/15/2009   ULCER-GASTRIC 08/15/2009   CONSTIPATION 08/15/2009   SLOW TRANSIT CONSTIPATION 08/15/2009   FLATULENCE ERUCTATION AND GAS PAIN 08/15/2009    PCP: [place],  Annie Main, MD  REFERRING PROVIDER: Lorine Bears, NP  REFERRING DIAG: [phone] (ICD-10-CM) - Radiculopathy, cervical region M25.511,G89.29 (ICD-10-CM) - Chronic right shoulder pain M54.2 (ICD-10-CM) - Cervicalgia M79.18 (ICD-10-CM) - Myofascial pain syndrome  THERAPY DIAG:  Cervicalgia  Chronic bilateral low back pain with right-sided sciatica  Chronic right shoulder pain  Abnormal posture  Muscle weakness (generalized)  Cramp and spasm  Rationale for  Evaluation and Treatment Rehabilitation  ONSET DATE: several years  SUBJECTIVE:                                                                                                                                                                                                         SUBJECTIVE STATEMENT: Becky [name] reports back is doing better than the neck, neck is doing better than last week.  "I've got a schedule, I'll get my coffee and paper, and then do my back to limber it up and help subside pain, then I do neck throughout the day, especially when walking the dog."   PERTINENT HISTORY:  Chronic low back pain with radiculopathy, myofascial trigger point injections every 6 weeks, back ablation, scoliosis, osteoporosis, RA, Type 1 DM, neuropathy, Sjogren's syndrome  PAIN:  Are you having pain? Yes: NPRS scale: 3/10 Pain location: neck Pain description: achey  Are you having pain? Yes: NPRS scale: 1/10 Pain location: low back   Pain description: constant  Aggravating factors: worse in mornings Relieving factors: improves with movement, heat  PRECAUTIONS: None  WEIGHT BEARING RESTRICTIONS No  FALLS:  Has patient fallen in last 6 months? No  feels unsteady due to severe neuropathy in feet.   LIVING ENVIRONMENT: Lives with: lives with their spouse Lives in: House/apartment Stairs: No Has following equipment at home: None  OCCUPATION: retired,  walks 30 min daily  PLOF: Independent  PATIENT GOALS: improve neck and back pain.   OBJECTIVE:   DIAGNOSTIC FINDINGS:  MRI Cervical Spine 01/12/2022  IMPRESSION: 1. C4-C5 mild spinal canal stenosis and mild right neural foraminal narrowing. 2. C6-C7 moderate right and mild left neural foraminal narrowing. 3. C3-C4 mild right neural foraminal narrowing. MRI Lumbar spine 01/12/2022  IMPRESSION: 1. L1-L2 mild right neural foraminal narrowing. Narrowing of the right lateral recess at this level could affect the descending  right L2 nerve roots. 2. Multilevel facet arthropathy, which can be a cause of back pain.   PATIENT SURVEYS:  Modified Oswestry 20/50= 40% moderate disability  NDI 24/50 = 48% moderate disability    COGNITION: Overall cognitive status: Within functional limits for tasks assessed   SENSATION:  Severe neuropathy bil feet.   POSTURE: rounded shoulders and forward head - right shoulder forward more than left.   PALPATION: Hypomobility throughout cervical, upper thoracic, and lumbar spine.  Tenderness R UT, levator, suboccipitals and cervical erector spinae. Elevated R 1st rib, forward R shoulder.  Tenderness L2-3, bil QL (R>L), and lumbar multifidi.   CERVICAL ROM:   Active ROM AROM (deg) eval AROM  04/06/2022 AROM 04/27/2022  Flexion 40 45 50  Extension 50 55 40  Right lateral flexion _0 Left lateral flexion _1 Right rotation 55 60 65  Left rotation 60 70 60   (Blank rows = not tested)  LUMBAR ROM:   Active  A/PROM  eval AROM 04/06/2022  Flexion To shins * Almost to ankles  Extension 90% limited * No pain, limited 50%  Right lateral flexion To knees  To  knees, slight increase in pain  Left lateral flexion To knees inc pain compared to R To knees  Right rotation WNL WNL  Left rotation WNL* WNL    (Blank rows = not tested)  *pain  UPPER EXTREMITY ROM:  Active ROM Right eval Left eval Right 04/27/2022  Shoulder flexion 130* 135 135*  Shoulder extension 55* 65 55  Shoulder abduction 115* 115 115*  Shoulder adduction     Shoulder extension     Shoulder internal rotation functional low back * functional midback   Shoulder external rotation FER T1* FER t2    (Blank rows = not tested)  *pain  UPPER EXTREMITY MMT:  MMT Right* eval Left eval  Shoulder flexion 4+* 5  Shoulder extension    Shoulder abduction 5 5  Shoulder adduction 5 5  Shoulder extension    Shoulder internal rotation 5 5  Shoulder external rotation 5 5  Elbow flexion 5 5   Elbow extension 5 5  Wrist flexion 5 5  Wrist extension 5 5  Grip strength good good   (Blank rows = not tested) *pain  LOWER EXTREMITY MMT:    MMT Right eval Left eval  Hip flexion 5 5  Hip extension    Hip abduction 5 5  Hip adduction 5 5  Hip internal rotation    Hip external rotation    Knee flexion 5 5  Knee extension 5 5  Ankle dorsiflexion 5 5  Ankle plantarflexion     (Blank rows = not tested)    CERVICAL SPECIAL TESTS:  Spurling's test: Negative   FUNCTIONAL TESTS:  5 times sit to stand: 11.64 seconds without UE assist.    TODAY'S TREATMENT:  04/27/2022 Therapeutic Exercise: to improve strength and mobility.  Demo, verbal and tactile cues throughout for technique.  UBE L2 x 6 min ([id number])  Cervical SNAGs with pillow case - extension and rotation  Review HEP Therapeutic Activity:  review of progress towards goals  ROM, goals, NDI, modified Oswestry Manual Therapy: to decrease muscle spasm and pain and improve mobility. STM/TPR to bil levator/UT, cervical paraspinals; skilled palpation and monitoring during dry needling. Trigger Point Dry-Needling  Treatment instructions: Expect mild to moderate muscle soreness. S/S of pneumothorax if dry needled over a lung field, and to seek immediate medical attention should they occur. Patient verbalized understanding of these instructions and education.  Patient Consent Given: Yes Education handout provided: Previously provided Muscles treated: bil UT, levator scapulae, cervical multifidi C5-6 bil Electrical stimulation performed: No Parameters: N/A Treatment response/outcome: Twitch Response Elicited and Palpable Increase in Muscle Length  04/22/2022 Therapeutic Exercise: to improve strength and mobility.  Demo, verbal and tactile cues throughout for technique. UBE L2 x 6 min  Active release R levator pin and stretch with pt. Performing L/S stretch 5 x 10 sec hold Manual Therapy: to decrease muscle spasm and pain  and improve mobility. STM/TPR to R levator/UT, bil pecs, R QL Skilled palpation and monitoring during dry needling. Trigger Point Dry-Needling  Treatment instructions: Expect mild to moderate muscle soreness. S/S of pneumothorax if dry needled over a lung field, and to seek immediate medical attention should they occur. Patient verbalized understanding of these instructions and education.  Patient Consent Given: Yes Education handout provided: Previously provided Muscles treated: R levator, R QL, bil pecs Electrical stimulation performed: No Parameters: N/A Treatment response/outcome: Twitch Response Elicited and Palpable Increase in Muscle Length   04/20/2022 Therapeutic Exercise: to improve strength and mobility.  Demo, verbal and tactile cues throughout for technique. Nustep L5 x 6 min  UT and L/S stretches Manual Therapy: to decrease muscle spasm and pain and improve mobility. STM/TPR to bil cervical paraspinals, UT and levator, CPA and UPA mobs cervical spine grade 2-3; pec stretches, UT stretch, scapular mobs.  Skilled palpation and monitoring during dry needling. Trigger Point Dry-Needling  Treatment instructions: Expect mild to moderate muscle soreness. S/S of pneumothorax if dry needled over a lung field, and to seek immediate medical attention should they occur. Patient verbalized understanding of these instructions and education.  Patient Consent Given: Yes Education handout provided: Previously provided Muscles treated: bil Levator, bil UT, Bil cervical multifidi C4-6 Electrical stimulation performed: No Parameters: N/A Treatment response/outcome: Twitch Response Elicited and Palpable Increase in Muscle Length Modalities: MHP to neck and back to decrease tightness x 10 min    PATIENT EDUCATION:  Education details: HEP update 04/13/2022 Person educated: Patient Education method: Customer service manager Education comprehension: verbalized understanding and returned  demonstration   HOME EXERCISE PROGRAM: Access Code: [email or web address] URL: [email or web address] Date: 04/13/2022 Prepared by: Glenetta Hew  Exercises - Supine Lower Trunk Rotation  - 1 x daily - 7 x weekly - 2 sets - 10 reps - Supine Posterior Pelvic Tilt  - 1 x daily - 7 x weekly - 2 sets - 10 reps - Static Prone on Elbows  - 1 x daily - 7 x weekly - Standing Bilateral Low Shoulder Row with Anchored Resistance  - 1 x daily - 7 x weekly - 2 sets - 10 reps - Shoulder Extension with Resistance  - 1 x daily - 7 x weekly - 2 sets - 10 reps - Supine Posterior Pelvic Tilt  - 1 x daily - 7 x weekly - 1 sets - 10 reps - 5 sec  hold - Supine Bridge  - 1 x daily - 7 x weekly - 3 sets - 10 reps - Bird Dog  - 1 x daily - 7 x weekly - 3 sets - 10 reps - Cat Cow  - 1 x daily - 7 x weekly - 3 sets - 10 reps - Seated Piriformis Stretch  - 1 x daily - 7 x weekly - 3 sets - 30 sec hold - Seated Piriformis Stretch with Trunk Bend  - 1 x daily - 7 x weekly - 3 sets - 30 sec hold - Seated Cervical Retraction  - 3 x daily - 7 x weekly - 1 sets - 10 reps - Gentle Levator Scapulae Stretch  - 1 x daily - 7 x weekly - 1 sets -  3 reps - 10 sec hold - Seated Gentle Upper Trapezius Stretch  - 1 x daily - 7 x weekly - 1 sets - 3 reps - 10 sec  hold - Seated Shoulder Rolls  - 3 x daily - 7 x weekly - 1 sets - 10 reps - Seated Scapular Retraction  - 1 x daily - 7 x weekly - 1 sets - 10 reps - 3 sec  hold - Cervical Extension AROM with Strap  - 1 x daily - 7 x weekly - 1 sets - 10 reps - Seated Assisted Cervical Rotation with Towel  - 1 x daily - 7 x weekly - 1 sets - 10 reps - Doorway Pec Stretch at 60 Elevation  - 1 x daily - 7 x weekly - 1 sets - 3 reps - 15-30 sec hold - Pec Minor Stretch  - 1 x daily - 7 x weekly - 1 sets - 3 reps - 15-30 sec  hold - Standing Thoracic Open Book at [place]  - 1 x daily - 7 x weekly - 1 sets - 10 reps - Sidelying Open Book Thoracic Lumbar Rotation and Extension  - 1 x daily  - 7 x weekly - 1 sets - 10 reps   ASSESSMENT:  CLINICAL IMPRESSION: Becky [name] reports that her neck feels better but still tight.  She also continues to have R shoulder pain with end range movements, she reports long standing bil shoulder pain for 20-30 years.  Overall Becky [name] reports 50% improvement in neck pain and 80% improvement in low back pain.  Becky [name] continues to demonstrate potential for improvement and would benefit from continued skilled therapy to address impairments.  She has now met long-term goals #1 and #6 and has almost met long-term goals #4 and #5.  She still demonstrates increased muscle spasm throughout her cervical musculature limiting full range of motion, and reports still having pain with driving.  Becky [name] reports some many other interventions have been performed in the past for both neck pain and back pain, and physical therapy has given her the best relief so.  Based on her improvement recommending continued skilled therapy for 6 weeks at 1 time a week then decrease to every other week for an additional 6 weeks if needed.  OBJECTIVE IMPAIRMENTS decreased activity tolerance, decreased balance, decreased endurance, decreased ROM, decreased strength, hypomobility, increased fascial restrictions, impaired perceived functional ability, increased muscle spasms, impaired flexibility, impaired UE functional use, improper body mechanics, postural dysfunction, and pain.   ACTIVITY LIMITATIONS carrying, lifting, bending, sleeping, bed mobility, dressing, and reach over head  PARTICIPATION LIMITATIONS: meal prep, cleaning, laundry, driving, shopping, community activity, and yard work  PERSONAL FACTORS 3+ comorbidities: Chronic low back pain, migraines, scoliosis, osteoporosis, RA, IDDM, neuropathy, Sjogren's syndrome  are also affecting patient's functional outcome.   REHAB POTENTIAL: Good  CLINICAL DECISION MAKING: Evolving/moderate complexity  EVALUATION  COMPLEXITY: Moderate   GOALS: Goals reviewed with patient? Yes  SHORT TERM GOALS: Target date: 02/09/2022   Patient will be independent with initial HEP.  Baseline: given for back Goal status: MET   LONG TERM GOALS: Target date: 03/16/2022 extended to 04/27/2022  Patient will be independent with advanced/ongoing HEP to improve outcomes and carryover.  Baseline: needs progression Goal status: MET 04/27/2022- met for current  2.  Patient will report 75% improvement in neck pain when sleeping/waking to improve QOL.  Baseline: severe pain in mornings.  Goal status: IN PROGRESS  04/27/2022 - 50% improvement  overall.   3.  Patient will demonstrate full pain free cervical ROM for safety with driving.  Baseline: see objective Goal status: IN PROGRESS 04/06/22- see objective, improved, still some pain with L SB  04/27/2022- still limited by pain and tightness  4.  Patient will report <17/50 on NDI  to demonstrate improved functional ability. Baseline: 24/50 Goal status: IN PROGRESS 05/10/22- 17/50  5.   Patient will report <14/50 on modified Oswestry to demonstrate improved QOL Baseline: 20/50 Goal status: IN PROGRESS 05/10/22- 15/50  6.  Patient will report 75% improvement in LBP in mornings to improve QOL.    Baseline: 14/10 when wakes Goal status: MET  03/16/22- 10% improvement, not as severe in AM; 04/27/2022- 80% improvement overall  7. Patient will demonstrate improved posture to decrease muscle imbalance. Baseline: forward head, rounded shoulders Goal status: IN PROGRESS  03/16/22- improving, less cues needed.     PLAN: PT FREQUENCY: 1x/week decreasing to every other week after 6 weeks as appropriate.  PT DURATION: 12 weeks extended to 07/20/2022  PLANNED INTERVENTIONS: Therapeutic exercises, Therapeutic activity, Neuromuscular re-education, Balance training, Gait training, Patient/Family education, Self Care, Joint mobilization, Dry Needling, Electrical stimulation, Spinal  mobilization, Cryotherapy, Moist heat, Traction, Ultrasound, Ionotophoresis 43m/ml Dexamethasone, Manual therapy, and Re-evaluation  PLAN FOR NEXT SESSION:  Add weight bearing/resistance training for osteopenia as appropriate.   Gentle shoulder strengthening.    ERennie Natter PT, DPT 04/27/2022, 1:13 PM

## 2022-04-28 DIAGNOSIS — Z961 Presence of intraocular lens: Secondary | ICD-10-CM | POA: Diagnosis not present

## 2022-04-28 DIAGNOSIS — H04123 Dry eye syndrome of bilateral lacrimal glands: Secondary | ICD-10-CM | POA: Diagnosis not present

## 2022-04-28 DIAGNOSIS — H26492 Other secondary cataract, left eye: Secondary | ICD-10-CM | POA: Diagnosis not present

## 2022-05-05 ENCOUNTER — Ambulatory Visit: Payer: Medicare Other | Admitting: Physical Therapy

## 2022-05-05 ENCOUNTER — Encounter: Payer: Self-pay | Admitting: Physical Therapy

## 2022-05-05 DIAGNOSIS — R252 Cramp and spasm: Secondary | ICD-10-CM

## 2022-05-05 DIAGNOSIS — I129 Hypertensive chronic kidney disease with stage 1 through stage 4 chronic kidney disease, or unspecified chronic kidney disease: Secondary | ICD-10-CM | POA: Diagnosis not present

## 2022-05-05 DIAGNOSIS — E1040 Type 1 diabetes mellitus with diabetic neuropathy, unspecified: Secondary | ICD-10-CM | POA: Diagnosis not present

## 2022-05-05 DIAGNOSIS — M542 Cervicalgia: Secondary | ICD-10-CM | POA: Diagnosis not present

## 2022-05-05 DIAGNOSIS — G8929 Other chronic pain: Secondary | ICD-10-CM | POA: Diagnosis not present

## 2022-05-05 DIAGNOSIS — R293 Abnormal posture: Secondary | ICD-10-CM | POA: Diagnosis not present

## 2022-05-05 DIAGNOSIS — E785 Hyperlipidemia, unspecified: Secondary | ICD-10-CM | POA: Diagnosis not present

## 2022-05-05 DIAGNOSIS — M25511 Pain in right shoulder: Secondary | ICD-10-CM | POA: Diagnosis not present

## 2022-05-05 DIAGNOSIS — M6281 Muscle weakness (generalized): Secondary | ICD-10-CM | POA: Diagnosis not present

## 2022-05-05 DIAGNOSIS — N1831 Chronic kidney disease, stage 3a: Secondary | ICD-10-CM | POA: Diagnosis not present

## 2022-05-05 DIAGNOSIS — M5441 Lumbago with sciatica, right side: Secondary | ICD-10-CM | POA: Diagnosis not present

## 2022-05-05 NOTE — Therapy (Signed)
OUTPATIENT PHYSICAL THERAPY TREATMENT    Patient Name: QUINTERIA CHISUM MRN: [id number] DOB:Sep 02, 1955, 66 y.o., female Today's Date: 05/05/2022   PT End of Session - 05/05/22 0804     Visit Number 15    Number of Visits 25    Date for PT Re-Evaluation 07/20/22    Authorization Type [employer]    Progress Note Due on Visit 20    PT Start Time 0803    PT Stop Time 0844    PT Time Calculation (min) 41 min    Activity Tolerance Patient tolerated treatment well    Behavior During Therapy [facility] for tasks assessed/performed                 Past Medical History:  Diagnosis Date   Abnormal ECG    Anemia    Anxiety    Cataract    . 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   Sjoegren syndrome    Past Surgical History:  Procedure Laterality Date   back ablation  03/2020   back ablation  10/09/2020   BREAST LUMPECTOMY  1995   CARPAL TUNNEL RELEASE  2000   CATARACT EXTRACTION Right 09-05-12   CATARACT EXTRACTION Left 3/15   CESAREAN SECTION  1987   COLONOSCOPY     EYE SURGERY     diabetic retinopathy    HYSTEROSCOPY  02/2001   and D & C   LYMPH NODE DISSECTION  2002   TOE SURGERY  2000   bone removed   Patient Active Problem List   Diagnosis Date Noted   Posterior vitreous detachment of left eye 09/15/2021   Dry eye syndrome, bilateral 09/15/2021   Scoliosis 07/24/2020   Spondylosis without myelopathy or radiculopathy, lumbar region 07/24/2020   Chronic bilateral low back pain without sciatica 07/24/2020   Posterior vitreous  detachment of right eye 03/18/2020   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 determined by examination associated with type 1 diabetes mellitus ([place]) 03/18/2020   Stable treated proliferative diabetic retinopathy of right eye determined by examination associated with type 2 diabetes mellitus ([place]) 03/18/2020   History of vitrectomy 77/04/6578   Complicated UTI (urinary tract infection) 12/13/2018   Failure of outpatient treatment 03/83/3383   Acute metabolic encephalopathy 29/19/1660   Fever 12/10/2018   At high risk for breast cancer 05/09/2018   Eosinophilia 01/31/2017   Iritis 01/04/2017   History of joint swelling 08/24/2016   Osteoarthritis of knees, bilateral 08/24/2016   History of migraine 08/24/2016   History of depression 08/24/2016   History of osteoporosis 08/24/2016   DDD (degenerative disc disease), lumbar 08/24/2016   High risk medication use 08/24/2016   Trigger finger, left middle finger 08/24/2016   Trigger finger, right ring finger 08/24/2016   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   Increased risk of breast cancer 07/06/2015   IDDM (insulin dependent diabetes mellitus) 07/06/2015   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 without organ or systems  involvement ([place]) 07/06/2015   Sjogren's syndrome ([place]) 07/06/2015   Genetic testing 05/12/2015   Family history of breast cancer 04/15/2015   Fibrocystic breast disease 10/11/2012   GERD 10/01/2009   Chest pain with moderate risk for cardiac etiology 09/02/2009   DYSPHAGIA 09/02/2009   ANXIETY 08/15/2009   ULCER-GASTRIC 08/15/2009   CONSTIPATION 08/15/2009   SLOW TRANSIT CONSTIPATION 08/15/2009   FLATULENCE ERUCTATION AND GAS PAIN 08/15/2009    PCP: Reynold Bowen, MD  REFERRING PROVIDER: Lorine Bears, NP  REFERRING DIAG: [phone] (ICD-10-CM) - Radiculopathy, cervical region M25.511,G89.29 (ICD-10-CM) - Chronic right shoulder pain M54.2 (ICD-10-CM) -  Cervicalgia M79.18 (ICD-10-CM) - Myofascial pain syndrome  THERAPY DIAG:  Cervicalgia  Chronic bilateral low back pain with right-sided sciatica  Abnormal posture  Muscle weakness (generalized)  Cramp and spasm  Chronic right shoulder pain  Rationale for Evaluation and Treatment Rehabilitation  ONSET DATE: several years  SUBJECTIVE:                                                                                                                                                                                                         SUBJECTIVE STATEMENT: [name] reports she just woke  up so hasn't had chance to do exercises, but feels better than she thought she would.   PERTINENT HISTORY:  Chronic low back pain with radiculopathy, myofascial trigger point injections every 6 weeks, back ablation, scoliosis, osteoporosis, RA, Type 1 DM, neuropathy, Sjogren's syndrome  PAIN:  Are you having pain? Yes: NPRS scale: 4/10 Pain location: neck Pain description: achey  Are you having pain? Yes: NPRS scale: 2/10 Pain location: right side low back   Pain description: constant  Aggravating factors: worse in mornings Relieving factors: improves with movement, heat  PRECAUTIONS: None  WEIGHT BEARING RESTRICTIONS No  FALLS:  Has patient fallen in last 6 months? No  feels unsteady due to severe neuropathy in feet.   LIVING ENVIRONMENT: Lives with: lives with their spouse Lives in: House/apartment Stairs: No Has following equipment at home: None  OCCUPATION: retired,  walks 30 min daily  PLOF: Independent  PATIENT GOALS: improve neck and back pain.   OBJECTIVE:   DIAGNOSTIC FINDINGS:  MRI Cervical Spine 01/12/2022  IMPRESSION: 1. C4-C5 mild spinal canal stenosis and mild right neural foraminal narrowing. 2. C6-C7 moderate right and mild left neural foraminal narrowing. 3. C3-C4 mild right neural foraminal narrowing. MRI Lumbar spine 01/12/2022  IMPRESSION: 1.  L1-L2 mild right neural foraminal narrowing. Narrowing of the right lateral recess at this level could affect the descending  right L2 nerve roots. 2. Multilevel facet arthropathy, which can be a cause of back pain.   PATIENT SURVEYS:  Modified Oswestry 20/50= 40% moderate disability  NDI 24/50 = 48% moderate disability    COGNITION: Overall cognitive status: Within functional limits for tasks assessed   SENSATION: Severe neuropathy bil feet.   POSTURE: rounded shoulders and forward head - right shoulder forward more than left.   PALPATION: Hypomobility throughout cervical, upper thoracic, and lumbar spine.  Tenderness R UT, levator, suboccipitals and cervical erector spinae. Elevated R 1st rib, forward R shoulder.  Tenderness L2-3, bil QL (R>L), and lumbar multifidi.   CERVICAL ROM:   Active ROM AROM (deg) eval AROM  04/06/2022 AROM 04/27/2022  Flexion 40 45 50  Extension 50 55 40  Right lateral flexion _0 Left lateral flexion _1 Right rotation 55 60 65  Left rotation 60 70 60   (Blank rows = not tested)  LUMBAR ROM:   Active  A/PROM  eval AROM 04/06/2022  Flexion To shins * Almost to ankles  Extension 90% limited * No pain, limited 50%  Right lateral flexion To knees  To  knees, slight increase in pain  Left lateral flexion To knees inc pain compared to R To knees  Right rotation WNL WNL  Left rotation WNL* WNL    (Blank rows = not tested)  *pain  UPPER EXTREMITY ROM:  Active ROM Right eval Left eval Right 04/27/2022  Shoulder flexion 130* 135 135*  Shoulder extension 55* 65 55  Shoulder abduction 115* 115 115*  Shoulder adduction     Shoulder extension     Shoulder internal rotation functional low back * functional midback   Shoulder external rotation FER T1* FER t2    (Blank rows = not tested)  *pain  UPPER EXTREMITY MMT:  MMT Right* eval Left eval  Shoulder flexion 4+* 5  Shoulder extension    Shoulder abduction 5 5  Shoulder  adduction 5 5  Shoulder extension    Shoulder internal rotation 5 5  Shoulder external rotation 5 5  Elbow flexion 5 5  Elbow extension 5 5  Wrist flexion 5 5  Wrist extension 5 5  Grip strength good good   (Blank rows = not tested) *pain  LOWER EXTREMITY MMT:    MMT Right eval Left eval  Hip flexion 5 5  Hip extension    Hip abduction 5 5  Hip adduction 5 5  Hip internal rotation    Hip external rotation    Knee flexion 5 5  Knee extension 5 5  Ankle dorsiflexion 5 5  Ankle plantarflexion     (Blank rows = not tested)    CERVICAL SPECIAL TESTS:  Spurling's test: Negative   FUNCTIONAL TESTS:  5 times sit to stand: 11.64 seconds without UE assist.    TODAY'S TREATMENT:  05/05/2022 Therapeutic Exercise: to improve strength and mobility.  UBE L2 x 6 min (17f3b) S/L open books x 15 bil - cues for leg position and turning head.  Manual Therapy: to decrease muscle spasm and pain and improve mobility STM/TPR to lumbar paraspinals, R QL, R UPA mobs, STM/TPR to bil UT, L/S, and cervical paraspinals.  skilled palpation and monitoring during dry needling. Trigger Point Dry-Needling  Treatment instructions: Expect mild to moderate muscle soreness. S/S of pneumothorax if dry needled over a lung field, and to seek immediate medical attention should they occur. Patient verbalized understanding of these instructions and  education. Patient Consent Given: Yes Education handout provided: Previously provided Muscles treated: bil lumbar multifidi L2-5, R QL, bil UT, L/S bil cervical multifidi C3-6 Electrical stimulation performed: No Parameters: N/A Treatment response/outcome: Twitch Response Elicited and Palpable Increase in Muscle Length   04/27/2022 Therapeutic Exercise: to improve strength and mobility.  Demo, verbal and tactile cues throughout for technique.  UBE L2 x 6 min (14f3b)  Cervical SNAGs with pillow case - extension and rotation  Review HEP Therapeutic Activity:   review of progress towards goals  ROM, goals, NDI, modified Oswestry Manual Therapy: to decrease muscle spasm and pain and improve mobility. STM/TPR to bil levator/UT, cervical paraspinals; skilled palpation and monitoring during dry needling. Trigger Point Dry-Needling  Treatment instructions: Expect mild to moderate muscle soreness. S/S of pneumothorax if dry needled over a lung field, and to seek immediate medical attention should they occur. Patient verbalized understanding of these instructions and education.  Patient Consent Given: Yes Education handout provided: Previously provided Muscles treated: bil UT, levator scapulae, cervical multifidi C5-6 bil Electrical stimulation performed: No Parameters: N/A Treatment response/outcome: Twitch Response Elicited and Palpable Increase in Muscle Length    04/22/2022 Therapeutic Exercise: to improve strength and mobility.  Demo, verbal and tactile cues throughout for technique. UBE L2 x 6 min  Active release R levator pin and stretch with pt. Performing L/S stretch 5 x 10 sec hold Manual Therapy: to decrease muscle spasm and pain and improve mobility. STM/TPR to R levator/UT, bil pecs, R QL Skilled palpation and monitoring during dry needling. Trigger Point Dry-Needling  Treatment instructions: Expect mild to moderate muscle soreness. S/S of pneumothorax if dry needled over a lung field, and to seek immediate medical attention should they occur. Patient verbalized understanding of these instructions and education.  Patient Consent Given: Yes Education handout provided: Previously provided Muscles treated: R levator, R QL, bil pecs Electrical stimulation performed: No Parameters: N/A Treatment response/outcome: Twitch Response Elicited and Palpable Increase in Muscle Length   PATIENT EDUCATION:  Education details: HEP update 04/13/2022 Person educated: Patient Education method: Explanation and Demonstration Education comprehension:  verbalized understanding and returned demonstration   HOME EXERCISE PROGRAM: Access Code: [email or web address] URL: [email or web address] Date: 04/13/2022 Prepared by: EGlenetta Hew Exercises - Supine Lower Trunk Rotation  - 1 x daily - 7 x weekly - 2 sets - 10 reps - Supine Posterior Pelvic Tilt  - 1 x daily - 7 x weekly - 2 sets - 10 reps - Static Prone on Elbows  - 1 x daily - 7 x weekly - Standing Bilateral Low Shoulder Row with Anchored Resistance  - 1 x daily - 7 x weekly - 2 sets - 10 reps - Shoulder Extension with Resistance  - 1 x daily - 7 x weekly - 2 sets - 10 reps - Supine Posterior Pelvic Tilt  - 1 x daily - 7 x weekly - 1 sets - 10 reps - 5 sec  hold - Supine Bridge  - 1 x daily - 7 x weekly - 3 sets - 10 reps - Bird Dog  - 1 x daily - 7 x weekly - 3 sets - 10 reps - Cat Cow  - 1 x daily - 7 x weekly - 3 sets - 10 reps - Seated Piriformis Stretch  - 1 x daily - 7 x weekly - 3 sets - 30 sec hold - Seated Piriformis Stretch with Trunk Bend  - 1 x daily - 7 x weekly - 3 sets -  30 sec hold - Seated Cervical Retraction  - 3 x daily - 7 x weekly - 1 sets - 10 reps - Gentle Levator Scapulae Stretch  - 1 x daily - 7 x weekly - 1 sets - 3 reps - 10 sec hold - Seated Gentle Upper Trapezius Stretch  - 1 x daily - 7 x weekly - 1 sets - 3 reps - 10 sec  hold - Seated Shoulder Rolls  - 3 x daily - 7 x weekly - 1 sets - 10 reps - Seated Scapular Retraction  - 1 x daily - 7 x weekly - 1 sets - 10 reps - 3 sec  hold - Cervical Extension AROM with Strap  - 1 x daily - 7 x weekly - 1 sets - 10 reps - Seated Assisted Cervical Rotation with Towel  - 1 x daily - 7 x weekly - 1 sets - 10 reps - Doorway Pec Stretch at 60 Elevation  - 1 x daily - 7 x weekly - 1 sets - 3 reps - 15-30 sec hold - Pec Minor Stretch  - 1 x daily - 7 x weekly - 1 sets - 3 reps - 15-30 sec  hold - Standing Thoracic Open Book at [place]  - 1 x daily - 7 x weekly - 1 sets - 10 reps - Sidelying Open Book Thoracic  Lumbar Rotation and Extension  - 1 x daily - 7 x weekly - 1 sets - 10 reps   ASSESSMENT:  CLINICAL IMPRESSION: [name] reports stiffness this morning, but due to early appointment had not yet completed HEP.  She reports tightness and mild pain, but still well controlled overall.  Today she reported decreased tightness in both back, upperback and shoulders with s/l open book stretches.  Manual therapy and TrDN further relieved pain and tightness.  [name] continues to demonstrate potential for improvement and would benefit from continued skilled therapy to address impairments.     OBJECTIVE IMPAIRMENTS decreased activity tolerance, decreased balance, decreased endurance, decreased ROM, decreased strength, hypomobility, increased fascial restrictions, impaired perceived functional ability, increased muscle spasms, impaired flexibility, impaired UE functional use, improper body mechanics, postural dysfunction, and pain.   ACTIVITY LIMITATIONS carrying, lifting, bending, sleeping, bed mobility, dressing, and reach over head  PARTICIPATION LIMITATIONS: meal prep, cleaning, laundry, driving, shopping, community activity, and yard work  PERSONAL FACTORS 3+ comorbidities: Chronic low back pain, migraines, scoliosis, osteoporosis, RA, IDDM, neuropathy, Sjogren's syndrome  are also affecting patient's functional outcome.   REHAB POTENTIAL: Good  CLINICAL DECISION MAKING: Evolving/moderate complexity  EVALUATION COMPLEXITY: Moderate   GOALS: Goals reviewed with patient? Yes  SHORT TERM GOALS: Target date: 02/09/2022   Patient will be independent with initial HEP.  Baseline: given for back Goal status: MET   LONG TERM GOALS: Target date: 03/16/2022 extended to 04/27/2022  Patient will be independent with advanced/ongoing HEP to improve outcomes and carryover.  Baseline: needs progression Goal status: MET 04/27/2022- met for current  2.  Patient will report 75% improvement  in neck pain when sleeping/waking to improve QOL.  Baseline: severe pain in mornings.  Goal status: IN PROGRESS  04/27/2022 - 50% improvement overall.   3.  Patient will demonstrate full pain free cervical ROM for safety with driving.  Baseline: see objective Goal status: IN PROGRESS 04/06/22- see objective, improved, still some pain with L SB  04/27/2022- still limited by pain and tightness  4.  Patient will report <17/50 on NDI  to  demonstrate improved functional ability. Baseline: 24/50 Goal status: IN PROGRESS 05/10/22- 17/50  5.   Patient will report <14/50 on modified Oswestry to demonstrate improved QOL Baseline: 20/50 Goal status: IN PROGRESS 05/10/22- 15/50  6.  Patient will report 75% improvement in LBP in mornings to improve QOL.    Baseline: 14/10 when wakes Goal status: MET  03/16/22- 10% improvement, not as severe in AM; 04/27/2022- 80% improvement overall  7. Patient will demonstrate improved posture to decrease muscle imbalance. Baseline: forward head, rounded shoulders Goal status: IN PROGRESS  03/16/22- improving, less cues needed.     PLAN: PT FREQUENCY: 1x/week decreasing to every other week after 6 weeks as appropriate.  PT DURATION: 12 weeks extended to 07/20/2022  PLANNED INTERVENTIONS: Therapeutic exercises, Therapeutic activity, Neuromuscular re-education, Balance training, Gait training, Patient/Family education, Self Care, Joint mobilization, Dry Needling, Electrical stimulation, Spinal mobilization, Cryotherapy, Moist heat, Traction, Ultrasound, Ionotophoresis 8m/ml Dexamethasone, Manual therapy, and Re-evaluation  PLAN FOR NEXT SESSION:  Add weight bearing/resistance training for osteopenia as appropriate.   Gentle shoulder strengthening.    ERennie Natter PT, DPT 05/05/2022, 8:55 AM

## 2022-05-10 DIAGNOSIS — G43719 Chronic migraine without aura, intractable, without status migrainosus: Secondary | ICD-10-CM | POA: Diagnosis not present

## 2022-05-10 DIAGNOSIS — G518 Other disorders of facial nerve: Secondary | ICD-10-CM | POA: Diagnosis not present

## 2022-05-10 DIAGNOSIS — M542 Cervicalgia: Secondary | ICD-10-CM | POA: Diagnosis not present

## 2022-05-10 DIAGNOSIS — M791 Myalgia, unspecified site: Secondary | ICD-10-CM | POA: Diagnosis not present

## 2022-05-18 ENCOUNTER — Encounter (INDEPENDENT_AMBULATORY_CARE_PROVIDER_SITE_OTHER): Payer: Medicare Other | Admitting: Ophthalmology

## 2022-05-25 ENCOUNTER — Telehealth: Payer: Self-pay | Admitting: Rheumatology

## 2022-05-25 NOTE — Telephone Encounter (Unsigned)
Patient called requesting to speak with [facility] regarding her patient assistance application for Enbrel.

## 2022-05-26 ENCOUNTER — Ambulatory Visit: Payer: Medicare Other | Attending: Physical Medicine and Rehabilitation | Admitting: Physical Therapy

## 2022-05-26 ENCOUNTER — Encounter: Payer: Self-pay | Admitting: Physical Therapy

## 2022-05-26 DIAGNOSIS — M542 Cervicalgia: Secondary | ICD-10-CM | POA: Diagnosis not present

## 2022-05-26 DIAGNOSIS — M5441 Lumbago with sciatica, right side: Secondary | ICD-10-CM | POA: Diagnosis not present

## 2022-05-26 DIAGNOSIS — R252 Cramp and spasm: Secondary | ICD-10-CM | POA: Insufficient documentation

## 2022-05-26 DIAGNOSIS — M25511 Pain in right shoulder: Secondary | ICD-10-CM | POA: Insufficient documentation

## 2022-05-26 DIAGNOSIS — M6281 Muscle weakness (generalized): Secondary | ICD-10-CM | POA: Diagnosis not present

## 2022-05-26 DIAGNOSIS — R293 Abnormal posture: Secondary | ICD-10-CM | POA: Diagnosis not present

## 2022-05-26 DIAGNOSIS — G8929 Other chronic pain: Secondary | ICD-10-CM | POA: Diagnosis not present

## 2022-05-26 NOTE — Telephone Encounter (Signed)
Returned call to patient. She was inquiring about income that has to be written down. Provided clarification. She will drop off application to clinic once completed. Will await return  Chesley Mires, PharmD, MPH, BCPS, CPP Clinical Pharmacist (Rheumatology and Pulmonology)

## 2022-05-26 NOTE — Therapy (Signed)
OUTPATIENT PHYSICAL THERAPY TREATMENT    Patient Name: Becky [name] MRN: [id number] DOB:09-Jan-1956, 66 y.o., female Today's Date: 05/26/2022   PT End of Session - 05/26/22 1314     Visit Number 16    Number of Visits 25    Date for PT Re-Evaluation 07/20/22    Authorization Type [employer]    Progress Note Due on Visit 20    PT Start Time 1315    PT Stop Time 1405    PT Time Calculation (min) 50 min    Activity Tolerance Patient tolerated treatment well    Behavior During Therapy WFL for tasks assessed/performed                 Past Medical History:  Diagnosis Date   Abnormal ECG    Anemia    Anxiety    Cataract    . 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   Sjoegren syndrome    Past Surgical History:  Procedure Laterality Date   back ablation  03/2020   back ablation  10/09/2020   BREAST LUMPECTOMY  1995   CARPAL TUNNEL RELEASE  2000   CATARACT EXTRACTION Right 09-05-12   CATARACT EXTRACTION Left 3/15   CESAREAN SECTION  1987   COLONOSCOPY     EYE SURGERY     diabetic retinopathy    HYSTEROSCOPY  02/2001   and D & C   LYMPH NODE DISSECTION  2002   TOE SURGERY  2000   bone removed   Patient Active Problem List   Diagnosis Date Noted   Posterior vitreous detachment of left eye 09/15/2021   Dry eye syndrome, bilateral 09/15/2021   Scoliosis 07/24/2020   Spondylosis without myelopathy or radiculopathy, lumbar region 07/24/2020   Chronic bilateral low back pain without sciatica 07/24/2020   Posterior vitreous  detachment of right eye 03/18/2020   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 determined by examination associated with type 1 diabetes mellitus ([place]) 03/18/2020   Stable treated proliferative diabetic retinopathy of right eye determined by examination associated with type 2 diabetes mellitus ([place]) 03/18/2020   History of vitrectomy 14/78/2956   Complicated UTI (urinary tract infection) 12/13/2018   Failure of outpatient treatment 21/30/8657   Acute metabolic encephalopathy 84/69/6295   Fever 12/10/2018   At high risk for breast cancer 05/09/2018   Eosinophilia 01/31/2017   Iritis 01/04/2017   History of joint swelling 08/24/2016   Osteoarthritis of knees, bilateral 08/24/2016   History of migraine 08/24/2016   History of depression 08/24/2016   History of osteoporosis 08/24/2016   DDD (degenerative disc disease), lumbar 08/24/2016   High risk medication use 08/24/2016   Trigger finger, left middle finger 08/24/2016   Trigger finger, right ring finger 08/24/2016   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   Increased risk of breast cancer 07/06/2015   IDDM (insulin dependent diabetes mellitus) 07/06/2015   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 without organ or systems  involvement ([place]) 07/06/2015   Sjogren's syndrome ([place]) 07/06/2015   Genetic testing 05/12/2015   Family history of breast cancer 04/15/2015   Fibrocystic breast disease 10/11/2012   GERD 10/01/2009   Chest pain with moderate risk for cardiac etiology 09/02/2009   DYSPHAGIA 09/02/2009   ANXIETY 08/15/2009   ULCER-GASTRIC 08/15/2009   CONSTIPATION 08/15/2009   SLOW TRANSIT CONSTIPATION 08/15/2009   FLATULENCE ERUCTATION AND GAS PAIN 08/15/2009    PCP: Reynold Bowen, MD  REFERRING PROVIDER: Lorine Bears, NP  REFERRING DIAG: [phone] (ICD-10-CM) - Radiculopathy, cervical region M25.511,G89.29 (ICD-10-CM) - Chronic right shoulder pain M54.2 (ICD-10-CM) -  Cervicalgia M79.18 (ICD-10-CM) - Myofascial pain syndrome  THERAPY DIAG:  Cervicalgia  Chronic bilateral low back pain with right-sided sciatica  Abnormal posture  Muscle weakness (generalized)  Cramp and spasm  Chronic right shoulder pain  Rationale for Evaluation and Treatment Rehabilitation  ONSET DATE: several years  SUBJECTIVE:                                                                                                                                                                                                         SUBJECTIVE STATEMENT: Becky [name] reports that her back is doing well, her neck is very painful though.   PERTINENT HISTORY:  Chronic low back pain with radiculopathy, myofascial trigger point injections every 6 weeks, back ablation, scoliosis, osteoporosis, RA, Type 1 DM, neuropathy, Sjogren's syndrome  PAIN:  Are you having pain? Yes: NPRS scale: 8/10 Pain location: neck Pain description: achey  Are you having pain? Yes: NPRS scale: 2/10 Pain location: right side low back   Pain description: constant  Aggravating factors: worse in mornings Relieving factors: improves with movement, heat  PRECAUTIONS: None  WEIGHT BEARING RESTRICTIONS No  FALLS:  Has patient fallen in last 6 months? No  feels unsteady due to severe neuropathy in feet.   LIVING ENVIRONMENT: Lives with: lives with their spouse Lives in: House/apartment Stairs: No Has following equipment at home: None  OCCUPATION: retired,  walks 30 min daily  PLOF: Independent  PATIENT GOALS: improve neck and back pain.   OBJECTIVE:   DIAGNOSTIC FINDINGS:  MRI Cervical Spine 01/12/2022  IMPRESSION: 1. C4-C5 mild spinal canal stenosis and mild right neural foraminal narrowing. 2. C6-C7 moderate right and mild left neural foraminal narrowing. 3. C3-C4 mild right neural foraminal narrowing. MRI Lumbar spine 01/12/2022  IMPRESSION: 1. L1-L2 mild right neural foraminal  narrowing. Narrowing of the right lateral recess at this level could affect the descending right L2 nerve roots. 2. Multilevel facet arthropathy,  which can be a cause of back pain.   PATIENT SURVEYS:  Modified Oswestry 20/50= 40% moderate disability  NDI 24/50 = 48% moderate disability    COGNITION: Overall cognitive status: Within functional limits for tasks assessed   SENSATION: Severe neuropathy bil feet.   POSTURE: rounded shoulders and forward head - right shoulder forward more than left.   PALPATION: Hypomobility throughout cervical, upper thoracic, and lumbar spine.  Tenderness R UT, levator, suboccipitals and cervical erector spinae. Elevated R 1st rib, forward R shoulder.  Tenderness L2-3, bil QL (R>L), and lumbar multifidi.   CERVICAL ROM:   Active ROM AROM (deg) eval AROM  04/06/2022 AROM 04/27/2022  Flexion 40 45 50  Extension 50 55 40  Right lateral flexion _0 Left lateral flexion _1 Right rotation 55 60 65  Left rotation 60 70 60   (Blank rows = not tested)  LUMBAR ROM:   Active  A/PROM  eval AROM 04/06/2022  Flexion To shins * Almost to ankles  Extension 90% limited * No pain, limited 50%  Right lateral flexion To knees  To  knees, slight increase in pain  Left lateral flexion To knees inc pain compared to R To knees  Right rotation WNL WNL  Left rotation WNL* WNL    (Blank rows = not tested)  *pain  UPPER EXTREMITY ROM:  Active ROM Right eval Left eval Right 04/27/2022  Shoulder flexion 130* 135 135*  Shoulder extension 55* 65 55  Shoulder abduction 115* 115 115*  Shoulder adduction     Shoulder extension     Shoulder internal rotation functional low back * functional midback   Shoulder external rotation FER T1* FER t2    (Blank rows = not tested)  *pain  UPPER EXTREMITY MMT:  MMT Right* eval Left eval  Shoulder flexion 4+* 5  Shoulder extension    Shoulder abduction 5 5  Shoulder adduction 5 5  Shoulder extension     Shoulder internal rotation 5 5  Shoulder external rotation 5 5  Elbow flexion 5 5  Elbow extension 5 5  Wrist flexion 5 5  Wrist extension 5 5  Grip strength good good   (Blank rows = not tested) *pain  LOWER EXTREMITY MMT:    MMT Right eval Left eval  Hip flexion 5 5  Hip extension    Hip abduction 5 5  Hip adduction 5 5  Hip internal rotation    Hip external rotation    Knee flexion 5 5  Knee extension 5 5  Ankle dorsiflexion 5 5  Ankle plantarflexion     (Blank rows = not tested)    CERVICAL SPECIAL TESTS:  Spurling's test: Negative   FUNCTIONAL TESTS:  5 times sit to stand: 11.64 seconds without UE assist.    TODAY'S TREATMENT:  05/26/2022 Therapeutic Exercise: to improve strength and mobility.  Demo, verbal and tactile cues throughout for technique. UBE L2 x 6 min (25f3b) Pec stretch on foam roller  Arm flexion on foam roller for thoracic mobilization x 10 bil   Manual Therapy: to decrease muscle spasm and pain and improve mobility  STM/TPR to bil UT, L/S, and cervical paraspinals, gentle cervical traction, NAGs into rotation, PA mobs grade 2-3.  skilled palpation and monitoring during dry needling. Trigger Point Dry-Needling  Treatment instructions: Expect mild to moderate muscle soreness. S/S of pneumothorax if dry needled over a lung field, and to seek immediate medical attention should they occur. Patient  verbalized understanding of these instructions and education. Patient Consent Given: Yes Education handout provided: Previously provided Muscles treated:  bil UT, bil L/S, bil cervical multifidi C3-6 Electrical stimulation performed: No Parameters: N/A Treatment response/outcome: Twitch Response Elicited and Palpable Increase in Muscle Length Self Care: Education/demonstration self cervical traction devices and recommendations.   05/05/2022 Therapeutic Exercise: to improve strength and mobility.  UBE L2 x 6 min (8f3b) S/L open books x 15 bil - cues  for leg position and turning head.  Manual Therapy: to decrease muscle spasm and pain and improve mobility STM/TPR to lumbar paraspinals, R QL, R UPA mobs, STM/TPR to bil UT, L/S, and cervical paraspinals.  skilled palpation and monitoring during dry needling. Trigger Point Dry-Needling  Treatment instructions: Expect mild to moderate muscle soreness. S/S of pneumothorax if dry needled over a lung field, and to seek immediate medical attention should they occur. Patient verbalized understanding of these instructions and education. Patient Consent Given: Yes Education handout provided: Previously provided Muscles treated: bil lumbar multifidi L2-5, R QL, bil UT, L/S bil cervical multifidi C3-6 Electrical stimulation performed: No Parameters: N/A Treatment response/outcome: Twitch Response Elicited and Palpable Increase in Muscle Length   04/27/2022 Therapeutic Exercise: to improve strength and mobility.  Demo, verbal and tactile cues throughout for technique.  UBE L2 x 6 min (368fb)  Cervical SNAGs with pillow case - extension and rotation  Review HEP Therapeutic Activity:  review of progress towards goals  ROM, goals, NDI, modified Oswestry Manual Therapy: to decrease muscle spasm and pain and improve mobility. STM/TPR to bil levator/UT, cervical paraspinals; skilled palpation and monitoring during dry needling. Trigger Point Dry-Needling  Treatment instructions: Expect mild to moderate muscle soreness. S/S of pneumothorax if dry needled over a lung field, and to seek immediate medical attention should they occur. Patient verbalized understanding of these instructions and education.  Patient Consent Given: Yes Education handout provided: Previously provided Muscles treated: bil UT, levator scapulae, cervical multifidi C5-6 bil Electrical stimulation performed: No Parameters: N/A Treatment response/outcome: Twitch Response Elicited and Palpable Increase in Muscle Length    PATIENT  EDUCATION:  Education details: HEP update 04/13/2022 Person educated: Patient Education method: Explanation and Demonstration Education comprehension: verbalized understanding and returned demonstration   HOME EXERCISE PROGRAM: Access Code: [email or web address] URL: [email or web address] Date: 04/13/2022 Prepared by: ElGlenetta HewExercises - Supine Lower Trunk Rotation  - 1 x daily - 7 x weekly - 2 sets - 10 reps - Supine Posterior Pelvic Tilt  - 1 x daily - 7 x weekly - 2 sets - 10 reps - Static Prone on Elbows  - 1 x daily - 7 x weekly - Standing Bilateral Low Shoulder Row with Anchored Resistance  - 1 x daily - 7 x weekly - 2 sets - 10 reps - Shoulder Extension with Resistance  - 1 x daily - 7 x weekly - 2 sets - 10 reps - Supine Posterior Pelvic Tilt  - 1 x daily - 7 x weekly - 1 sets - 10 reps - 5 sec  hold - Supine Bridge  - 1 x daily - 7 x weekly - 3 sets - 10 reps - Bird Dog  - 1 x daily - 7 x weekly - 3 sets - 10 reps - Cat Cow  - 1 x daily - 7 x weekly - 3 sets - 10 reps - Seated Piriformis Stretch  - 1 x daily - 7 x weekly - 3 sets - 30 sec hold - Seated Piriformis  Stretch with Trunk Bend  - 1 x daily - 7 x weekly - 3 sets - 30 sec hold - Seated Cervical Retraction  - 3 x daily - 7 x weekly - 1 sets - 10 reps - Gentle Levator Scapulae Stretch  - 1 x daily - 7 x weekly - 1 sets - 3 reps - 10 sec hold - Seated Gentle Upper Trapezius Stretch  - 1 x daily - 7 x weekly - 1 sets - 3 reps - 10 sec  hold - Seated Shoulder Rolls  - 3 x daily - 7 x weekly - 1 sets - 10 reps - Seated Scapular Retraction  - 1 x daily - 7 x weekly - 1 sets - 10 reps - 3 sec  hold - Cervical Extension AROM with Strap  - 1 x daily - 7 x weekly - 1 sets - 10 reps - Seated Assisted Cervical Rotation with Towel  - 1 x daily - 7 x weekly - 1 sets - 10 reps - Doorway Pec Stretch at 60 Elevation  - 1 x daily - 7 x weekly - 1 sets - 3 reps - 15-30 sec hold - Pec Minor Stretch  - 1 x daily - 7 x weekly -  1 sets - 3 reps - 15-30 sec  hold - Standing Thoracic Open Book at [place]  - 1 x daily - 7 x weekly - 1 sets - 10 reps - Sidelying Open Book Thoracic Lumbar Rotation and Extension  - 1 x daily - 7 x weekly - 1 sets - 10 reps   ASSESSMENT:  CLINICAL IMPRESSION: Becky [name] reports that her back is doing very well with exercises, but still having difficulty with neck pain.  Decreased tightness following therex, and significant decrease in pain following manual therapy.  She reported significant relief with gentle cervical traction today, so demonstrated inexpensive options for home use.   Becky [name] continues to demonstrate potential for improvement and would benefit from continued skilled therapy to address impairments.     OBJECTIVE IMPAIRMENTS decreased activity tolerance, decreased balance, decreased endurance, decreased ROM, decreased strength, hypomobility, increased fascial restrictions, impaired perceived functional ability, increased muscle spasms, impaired flexibility, impaired UE functional use, improper body mechanics, postural dysfunction, and pain.   ACTIVITY LIMITATIONS carrying, lifting, bending, sleeping, bed mobility, dressing, and reach over head  PARTICIPATION LIMITATIONS: meal prep, cleaning, laundry, driving, shopping, community activity, and yard work  PERSONAL FACTORS 3+ comorbidities: Chronic low back pain, migraines, scoliosis, osteoporosis, RA, IDDM, neuropathy, Sjogren's syndrome  are also affecting patient's functional outcome.   REHAB POTENTIAL: Good  CLINICAL DECISION MAKING: Evolving/moderate complexity  EVALUATION COMPLEXITY: Moderate   GOALS: Goals reviewed with patient? Yes  SHORT TERM GOALS: Target date: 02/09/2022   Patient will be independent with initial HEP.  Baseline: given for back Goal status: MET   LONG TERM GOALS: Target date: 03/16/2022 extended to 04/27/2022  Patient will be independent with advanced/ongoing HEP to improve  outcomes and carryover.  Baseline: needs progression Goal status: MET 04/27/2022- met for current  2.  Patient will report 75% improvement in neck pain when sleeping/waking to improve QOL.  Baseline: severe pain in mornings.  Goal status: IN PROGRESS  04/27/2022 - 50% improvement overall.   3.  Patient will demonstrate full pain free cervical ROM for safety with driving.  Baseline: see objective Goal status: IN PROGRESS 04/06/22- see objective, improved, still some pain with L SB  04/27/2022- still limited by pain and  tightness  4.  Patient will report <17/50 on NDI  to demonstrate improved functional ability. Baseline: 24/50 Goal status: IN PROGRESS 05/10/22- 17/50  5.   Patient will report <14/50 on modified Oswestry to demonstrate improved QOL Baseline: 20/50 Goal status: IN PROGRESS 05/10/22- 15/50  6.  Patient will report 75% improvement in LBP in mornings to improve QOL.    Baseline: 14/10 when wakes Goal status: MET  03/16/22- 10% improvement, not as severe in AM; 04/27/2022- 80% improvement overall  7. Patient will demonstrate improved posture to decrease muscle imbalance. Baseline: forward head, rounded shoulders Goal status: IN PROGRESS  03/16/22- improving, less cues needed.     PLAN: PT FREQUENCY: 1x/week decreasing to every other week after 6 weeks as appropriate.  PT DURATION: 12 weeks extended to 07/20/2022  PLANNED INTERVENTIONS: Therapeutic exercises, Therapeutic activity, Neuromuscular re-education, Balance training, Gait training, Patient/Family education, Self Care, Joint mobilization, Dry Needling, Electrical stimulation, Spinal mobilization, Cryotherapy, Moist heat, Traction, Ultrasound, Ionotophoresis 69m/ml Dexamethasone, Manual therapy, and Re-evaluation  PLAN FOR NEXT SESSION:  Add weight bearing/resistance training for osteopenia as appropriate.   Gentle shoulder strengthening.    ERennie Natter PT, DPT 05/26/2022, 2:12 PM

## 2022-05-27 ENCOUNTER — Telehealth: Payer: Self-pay | Admitting: Pharmacist

## 2022-05-27 NOTE — Telephone Encounter (Signed)
[employer] called and wanted to know if patient is enrolled in patient assistance for enbrel. I advised that she is.

## 2022-05-27 NOTE — Telephone Encounter (Signed)
Received patient portion of Enbrel PAP renewal application. Pending provider signature. Placed in Sherron Ales, PA-C's folder for signature  Case ID: [id number]  Submitted a Prior Authorization renewal request to [facility] for ENBREL via CoverMyMeds. Will update once we receive a response.  Key: Caryl Pina, PharmD, MPH, BCPS, CPP Clinical Pharmacist (Rheumatology and Pulmonology)

## 2022-05-31 NOTE — Telephone Encounter (Signed)
Received a fax regarding Prior Authorization from [name] for ENBREL. Authorization has been DENIED because patient is receiving through PAP. Per rep, PA is already on file from 10/26/21 to 10/27/22. Rep will fax approval letter to clinic so we can submit with PAP  Chesley Mires, PharmD, MPH, BCPS, CPP Clinical Pharmacist (Rheumatology and Pulmonology)

## 2022-06-01 NOTE — Telephone Encounter (Signed)
Submitted Patient Assistance RENEWAL Application to [employer] for ENBREL along with provider portion, patient portion, med list, insurance card copy, and PA. Will update patient when we receive a response.  Fax# [phone] Phone# [phone]  Genie Mirabal, PharmD, MPH, BCPS, CPP Clinical Pharmacist (Rheumatology and Pulmonology)  

## 2022-06-02 ENCOUNTER — Encounter: Payer: Medicare Other | Admitting: Physical Therapy

## 2022-06-02 DIAGNOSIS — N1831 Chronic kidney disease, stage 3a: Secondary | ICD-10-CM | POA: Diagnosis not present

## 2022-06-02 DIAGNOSIS — E785 Hyperlipidemia, unspecified: Secondary | ICD-10-CM | POA: Diagnosis not present

## 2022-06-02 DIAGNOSIS — E103493 Type 1 diabetes mellitus with severe nonproliferative diabetic retinopathy without macular edema, bilateral: Secondary | ICD-10-CM | POA: Diagnosis not present

## 2022-06-02 DIAGNOSIS — E1040 Type 1 diabetes mellitus with diabetic neuropathy, unspecified: Secondary | ICD-10-CM | POA: Diagnosis not present

## 2022-06-03 ENCOUNTER — Encounter: Payer: Medicare Other | Admitting: Physical Therapy

## 2022-06-08 ENCOUNTER — Ambulatory Visit: Payer: Medicare Other | Admitting: Physical Therapy

## 2022-06-08 ENCOUNTER — Telehealth: Payer: Self-pay | Admitting: Rheumatology

## 2022-06-08 NOTE — Telephone Encounter (Signed)
Patient called the office requesting to speak with [facility] regarding her Enbrel patient assistance application renewal.

## 2022-06-09 ENCOUNTER — Encounter: Payer: Self-pay | Admitting: Physical Therapy

## 2022-06-09 ENCOUNTER — Ambulatory Visit: Payer: Medicare Other | Admitting: Physical Therapy

## 2022-06-09 DIAGNOSIS — G8929 Other chronic pain: Secondary | ICD-10-CM

## 2022-06-09 DIAGNOSIS — R293 Abnormal posture: Secondary | ICD-10-CM

## 2022-06-09 DIAGNOSIS — R252 Cramp and spasm: Secondary | ICD-10-CM

## 2022-06-09 DIAGNOSIS — M5441 Lumbago with sciatica, right side: Secondary | ICD-10-CM | POA: Diagnosis not present

## 2022-06-09 DIAGNOSIS — M25511 Pain in right shoulder: Secondary | ICD-10-CM | POA: Diagnosis not present

## 2022-06-09 DIAGNOSIS — M542 Cervicalgia: Secondary | ICD-10-CM

## 2022-06-09 DIAGNOSIS — M6281 Muscle weakness (generalized): Secondary | ICD-10-CM | POA: Diagnosis not present

## 2022-06-09 NOTE — Therapy (Addendum)
OUTPATIENT PHYSICAL THERAPY TREATMENT    Patient Name: Becky [name] MRN: [id number] DOB:1955-10-13, 66 y.o., female Today's Date: 06/09/2022   PT End of Session - 06/09/22 1021     Visit Number 17    Number of Visits 25    Date for PT Re-Evaluation 07/20/22    Authorization Type [employer]    Progress Note Due on Visit 20    PT Start Time 1020    PT Stop Time 1102    PT Time Calculation (min) 42 min    Activity Tolerance Patient tolerated treatment well    Behavior During Therapy WFL for tasks assessed/performed                 Past Medical History:  Diagnosis Date   Abnormal ECG    Anemia    Anxiety    Cataract    . 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) 2000   S/P cardiac cath 01/2012   Normal coronaries and normal LV function.   Sjoegren syndrome    Past Surgical History:  Procedure Laterality Date   back ablation  03/2020   back ablation  10/09/2020   BREAST LUMPECTOMY  1995   CARPAL TUNNEL RELEASE  2000   CATARACT EXTRACTION Right 09-05-12   CATARACT EXTRACTION Left 3/15   CESAREAN SECTION  1987   COLONOSCOPY     EYE SURGERY     diabetic retinopathy    HYSTEROSCOPY  02/2001   and D & C   LYMPH NODE DISSECTION  2002   TOE SURGERY  2000   bone removed   Patient Active Problem List   Diagnosis Date Noted   Posterior vitreous detachment of left eye 09/15/2021   Dry eye syndrome, bilateral 09/15/2021   Scoliosis 07/24/2020   Spondylosis without myelopathy or radiculopathy, lumbar region 07/24/2020   Chronic bilateral low back pain without sciatica 07/24/2020   Posterior vitreous  detachment of right eye 03/18/2020   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 determined by examination associated with type 1 diabetes mellitus ([place]) 03/18/2020   Stable treated proliferative diabetic retinopathy of right eye determined by examination associated with type 2 diabetes mellitus ([place]) 03/18/2020   History of vitrectomy 44/31/5400   Complicated UTI (urinary tract infection) 12/13/2018   Failure of outpatient treatment 86/76/1950   Acute metabolic encephalopathy 93/26/7124   Fever 12/10/2018   At high risk for breast cancer 05/09/2018   Eosinophilia 01/31/2017   Iritis 01/04/2017   History of joint swelling 08/24/2016   Osteoarthritis of knees, bilateral 08/24/2016   History of migraine 08/24/2016   History of depression 08/24/2016   History of osteoporosis 08/24/2016   DDD (degenerative disc disease), lumbar 08/24/2016   High risk medication use 08/24/2016   Trigger finger, left middle finger 08/24/2016   Trigger finger, right ring finger 08/24/2016   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   Increased risk of breast cancer 07/06/2015   IDDM (insulin dependent diabetes mellitus) 07/06/2015   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 without organ or systems  involvement ([place]) 07/06/2015   Sjogren's syndrome ([place]) 07/06/2015   Genetic testing 05/12/2015   Family history of breast cancer 04/15/2015   Fibrocystic breast disease 10/11/2012   GERD 10/01/2009   Chest pain with moderate risk for cardiac etiology 09/02/2009   DYSPHAGIA 09/02/2009   ANXIETY 08/15/2009   ULCER-GASTRIC 08/15/2009   CONSTIPATION 08/15/2009   SLOW TRANSIT CONSTIPATION 08/15/2009   FLATULENCE ERUCTATION AND GAS PAIN 08/15/2009    PCP: Reynold Bowen, MD  REFERRING PROVIDER: Lorine Bears, NP  REFERRING DIAG: [phone] (ICD-10-CM) - Radiculopathy, cervical region M25.511,G89.29 (ICD-10-CM) - Chronic right shoulder pain M54.2 (ICD-10-CM) -  Cervicalgia M79.18 (ICD-10-CM) - Myofascial pain syndrome  THERAPY DIAG:  Cervicalgia  Chronic bilateral low back pain with right-sided sciatica  Abnormal posture  Muscle weakness (generalized)  Cramp and spasm  Rationale for Evaluation and Treatment Rehabilitation  ONSET DATE: several years  SUBJECTIVE:                                                                                                                                                                                                         SUBJECTIVE STATEMENT: Becky [name] had a severe headache on Sunday with rainy weather, and neck has been bad since.   PERTINENT HISTORY:  Chronic low back pain with radiculopathy, myofascial trigger point injections every 6 weeks, back ablation, scoliosis, osteoporosis, RA, Type 1 DM, neuropathy, Sjogren's syndrome  PAIN:  Are you having pain? Yes: NPRS scale: 8/10 Pain location: neck Pain description: achey  Are you having pain? Yes: NPRS scale: 2/10 Pain location: right side low back   Pain description: constant  Aggravating factors: worse in mornings Relieving factors: improves with movement, heat  PRECAUTIONS: None  WEIGHT BEARING RESTRICTIONS No  FALLS:  Has patient fallen in last 6 months? No  feels unsteady due to severe neuropathy in feet.   LIVING ENVIRONMENT: Lives with: lives with their spouse Lives in: House/apartment Stairs: No Has following equipment at home: None  OCCUPATION: retired,  walks 30 min daily  PLOF: Independent  PATIENT GOALS: improve neck and back pain.   OBJECTIVE:   DIAGNOSTIC FINDINGS:  MRI Cervical Spine 01/12/2022  IMPRESSION: 1. C4-C5 mild spinal canal stenosis and mild right neural foraminal narrowing. 2. C6-C7 moderate right and mild left neural foraminal narrowing. 3. C3-C4 mild right neural foraminal narrowing. MRI Lumbar spine 01/12/2022  IMPRESSION: 1. L1-L2 mild right neural foraminal narrowing. Narrowing of  the right lateral recess at this level could affect the descending right L2 nerve roots. 2. Multilevel facet arthropathy, which can be  a cause of back pain.   PATIENT SURVEYS:  Modified Oswestry 20/50= 40% moderate disability  NDI 24/50 = 48% moderate disability    COGNITION: Overall cognitive status: Within functional limits for tasks assessed   SENSATION: Severe neuropathy bil feet.   POSTURE: rounded shoulders and forward head - right shoulder forward more than left.   PALPATION: Hypomobility throughout cervical, upper thoracic, and lumbar spine.  Tenderness R UT, levator, suboccipitals and cervical erector spinae. Elevated R 1st rib, forward R shoulder.  Tenderness L2-3, bil QL (R>L), and lumbar multifidi.   CERVICAL ROM:   Active ROM AROM (deg) eval AROM  04/06/2022 AROM 04/27/2022  Flexion 40 45 50  Extension 50 55 40  Right lateral flexion _0 Left lateral flexion _1 Right rotation 55 60 65  Left rotation 60 70 60   (Blank rows = not tested)  LUMBAR ROM:   Active  A/PROM  eval AROM 04/06/2022  Flexion To shins * Almost to ankles  Extension 90% limited * No pain, limited 50%  Right lateral flexion To knees  To  knees, slight increase in pain  Left lateral flexion To knees inc pain compared to R To knees  Right rotation WNL WNL  Left rotation WNL* WNL    (Blank rows = not tested)  *pain  UPPER EXTREMITY ROM:  Active ROM Right eval Left eval Right 04/27/2022  Shoulder flexion 130* 135 135*  Shoulder extension 55* 65 55  Shoulder abduction 115* 115 115*  Shoulder adduction     Shoulder extension     Shoulder internal rotation functional low back * functional midback   Shoulder external rotation FER T1* FER t2    (Blank rows = not tested)  *pain  UPPER EXTREMITY MMT:  MMT Right* eval Left eval  Shoulder flexion 4+* 5  Shoulder extension    Shoulder abduction 5 5  Shoulder adduction 5 5  Shoulder extension    Shoulder internal  rotation 5 5  Shoulder external rotation 5 5  Elbow flexion 5 5  Elbow extension 5 5  Wrist flexion 5 5  Wrist extension 5 5  Grip strength good good   (Blank rows = not tested) *pain  LOWER EXTREMITY MMT:    MMT Right eval Left eval  Hip flexion 5 5  Hip extension    Hip abduction 5 5  Hip adduction 5 5  Hip internal rotation    Hip external rotation    Knee flexion 5 5  Knee extension 5 5  Ankle dorsiflexion 5 5  Ankle plantarflexion     (Blank rows = not tested)    CERVICAL SPECIAL TESTS:  Spurling's test: Negative   FUNCTIONAL TESTS:  5 times sit to stand: 11.64 seconds without UE assist.    TODAY'S TREATMENT:  06/09/2022 Therapeutic Exercise: to improve strength and mobility.  Demo, verbal and tactile cues throughout for technique. UBE L2 x 6 min (70f3b) Arm flexion on foam roller for thoracic mobilization x 10 bil Trial cervical device to relax neck and shoulders - tolerated very well, provided information to purchase.  Manual Therapy: to decrease muscle spasm and pain and improve mobility  STM/TPR to bil UT, L/S, and cervical paraspinals, NAGs into rotation, PA mobs grade 2-3.  skilled palpation and monitoring during dry needling. Trigger Point Dry-Needling  Treatment instructions: Expect mild to moderate muscle soreness. S/S of pneumothorax if dry needled over a lung field, and to seek immediate medical attention should  they occur. Patient verbalized understanding of these instructions and education. Patient Consent Given: Yes Education handout provided: Previously provided Muscles treated:  bil UT, bil L/S, bil cervical multifidi C3-6 Electrical stimulation performed: No Parameters: N/A Treatment response/outcome: Twitch Response Elicited and Palpable Increase in Muscle Length   05/26/2022 Therapeutic Exercise: to improve strength and mobility.  Demo, verbal and tactile cues throughout for technique. UBE L2 x 6 min (43f3b) Pec stretch on foam roller  Arm  flexion on foam roller for thoracic mobilization x 10 bil   Manual Therapy: to decrease muscle spasm and pain and improve mobility  STM/TPR to bil UT, L/S, and cervical paraspinals, gentle cervical traction, NAGs into rotation, PA mobs grade 2-3.  skilled palpation and monitoring during dry needling. Trigger Point Dry-Needling  Treatment instructions: Expect mild to moderate muscle soreness. S/S of pneumothorax if dry needled over a lung field, and to seek immediate medical attention should they occur. Patient verbalized understanding of these instructions and education. Patient Consent Given: Yes Education handout provided: Previously provided Muscles treated:  bil UT, bil L/S, bil cervical multifidi C3-6 Electrical stimulation performed: No Parameters: N/A Treatment response/outcome: Twitch Response Elicited and Palpable Increase in Muscle Length Self Care: Education/demonstration self cervical traction devices and recommendations.   05/05/2022 Therapeutic Exercise: to improve strength and mobility.  UBE L2 x 6 min (376fb) S/L open books x 15 bil - cues for leg position and turning head.  Manual Therapy: to decrease muscle spasm and pain and improve mobility STM/TPR to lumbar paraspinals, R QL, R UPA mobs, STM/TPR to bil UT, L/S, and cervical paraspinals.  skilled palpation and monitoring during dry needling. Trigger Point Dry-Needling  Treatment instructions: Expect mild to moderate muscle soreness. S/S of pneumothorax if dry needled over a lung field, and to seek immediate medical attention should they occur. Patient verbalized understanding of these instructions and education. Patient Consent Given: Yes Education handout provided: Previously provided Muscles treated: bil lumbar multifidi L2-5, R QL, bil UT, L/S bil cervical multifidi C3-6 Electrical stimulation performed: No Parameters: N/A Treatment response/outcome: Twitch Response Elicited and Palpable Increase in Muscle  Length    PATIENT EDUCATION:  Education details: HEP update 04/13/2022 Person educated: Patient Education method: Explanation and Demonstration Education comprehension: verbalized understanding and returned demonstration   HOME EXERCISE PROGRAM: Access Code: [email or web address] URL: [email or web address] Date: 04/13/2022 Prepared by: ElGlenetta HewExercises - Supine Lower Trunk Rotation  - 1 x daily - 7 x weekly - 2 sets - 10 reps - Supine Posterior Pelvic Tilt  - 1 x daily - 7 x weekly - 2 sets - 10 reps - Static Prone on Elbows  - 1 x daily - 7 x weekly - Standing Bilateral Low Shoulder Row with Anchored Resistance  - 1 x daily - 7 x weekly - 2 sets - 10 reps - Shoulder Extension with Resistance  - 1 x daily - 7 x weekly - 2 sets - 10 reps - Supine Posterior Pelvic Tilt  - 1 x daily - 7 x weekly - 1 sets - 10 reps - 5 sec  hold - Supine Bridge  - 1 x daily - 7 x weekly - 3 sets - 10 reps - Bird Dog  - 1 x daily - 7 x weekly - 3 sets - 10 reps - Cat Cow  - 1 x daily - 7 x weekly - 3 sets - 10 reps - Seated Piriformis Stretch  - 1 x daily - 7 x weekly - 3  sets - 30 sec hold - Seated Piriformis Stretch with Trunk Bend  - 1 x daily - 7 x weekly - 3 sets - 30 sec hold - Seated Cervical Retraction  - 3 x daily - 7 x weekly - 1 sets - 10 reps - Gentle Levator Scapulae Stretch  - 1 x daily - 7 x weekly - 1 sets - 3 reps - 10 sec hold - Seated Gentle Upper Trapezius Stretch  - 1 x daily - 7 x weekly - 1 sets - 3 reps - 10 sec  hold - Seated Shoulder Rolls  - 3 x daily - 7 x weekly - 1 sets - 10 reps - Seated Scapular Retraction  - 1 x daily - 7 x weekly - 1 sets - 10 reps - 3 sec  hold - Cervical Extension AROM with Strap  - 1 x daily - 7 x weekly - 1 sets - 10 reps - Seated Assisted Cervical Rotation with Towel  - 1 x daily - 7 x weekly - 1 sets - 10 reps - Doorway Pec Stretch at 60 Elevation  - 1 x daily - 7 x weekly - 1 sets - 3 reps - 15-30 sec hold - Pec Minor Stretch  - 1  x daily - 7 x weekly - 1 sets - 3 reps - 15-30 sec  hold - Standing Thoracic Open Book at [place]  - 1 x daily - 7 x weekly - 1 sets - 10 reps - Sidelying Open Book Thoracic Lumbar Rotation and Extension  - 1 x daily - 7 x weekly - 1 sets - 10 reps   ASSESSMENT:  CLINICAL IMPRESSION: Becky [name] reported increased neck pain today after change in weather causing severe headache.  She continues to be able to manage her back pain with HEP.  She reported that she did have relief with cervical neck roll today, so given information on purchase, followed by manual therapy and TrDN to cervical paraspinals and UT/LS, after which she reported significant decrease in pain.   Becky [name] continues to demonstrate potential for improvement and would benefit from continued skilled therapy to address impairments.     OBJECTIVE IMPAIRMENTS decreased activity tolerance, decreased balance, decreased endurance, decreased ROM, decreased strength, hypomobility, increased fascial restrictions, impaired perceived functional ability, increased muscle spasms, impaired flexibility, impaired UE functional use, improper body mechanics, postural dysfunction, and pain.   ACTIVITY LIMITATIONS carrying, lifting, bending, sleeping, bed mobility, dressing, and reach over head  PARTICIPATION LIMITATIONS: meal prep, cleaning, laundry, driving, shopping, community activity, and yard work  PERSONAL FACTORS 3+ comorbidities: Chronic low back pain, migraines, scoliosis, osteoporosis, RA, IDDM, neuropathy, Sjogren's syndrome  are also affecting patient's functional outcome.   REHAB POTENTIAL: Good  CLINICAL DECISION MAKING: Evolving/moderate complexity  EVALUATION COMPLEXITY: Moderate   GOALS: Goals reviewed with patient? Yes  SHORT TERM GOALS: Target date: 02/09/2022   Patient will be independent with initial HEP.  Baseline: given for back Goal status: MET   LONG TERM GOALS: Target date: 03/16/2022 extended to  04/27/2022  Patient will be independent with advanced/ongoing HEP to improve outcomes and carryover.  Baseline: needs progression Goal status: MET 04/27/2022- met for current  2.  Patient will report 75% improvement in neck pain when sleeping/waking to improve QOL.  Baseline: severe pain in mornings.  Goal status: IN PROGRESS  04/27/2022 - 50% improvement overall.   3.  Patient will demonstrate full pain free cervical ROM for safety with driving.  Baseline:  see objective Goal status: IN PROGRESS 04/06/22- see objective, improved, still some pain with L SB  04/27/2022- still limited by pain and tightness  4.  Patient will report <17/50 on NDI  to demonstrate improved functional ability. Baseline: 24/50 Goal status: IN PROGRESS 05/10/22- 17/50  5.   Patient will report <14/50 on modified Oswestry to demonstrate improved QOL Baseline: 20/50 Goal status: IN PROGRESS 05/10/22- 15/50  6.  Patient will report 75% improvement in LBP in mornings to improve QOL.    Baseline: 14/10 when wakes Goal status: MET  03/16/22- 10% improvement, not as severe in AM; 04/27/2022- 80% improvement overall  7. Patient will demonstrate improved posture to decrease muscle imbalance. Baseline: forward head, rounded shoulders Goal status: IN PROGRESS  03/16/22- improving, less cues needed.     PLAN: PT FREQUENCY: 1x/week decreasing to every other week after 6 weeks if appropriate.  PT DURATION: 12 weeks extended to 07/20/2022  PLANNED INTERVENTIONS: Therapeutic exercises, Therapeutic activity, Neuromuscular re-education, Balance training, Gait training, Patient/Family education, Self Care, Joint mobilization, Dry Needling, Electrical stimulation, Spinal mobilization, Cryotherapy, Moist heat, Traction, Ultrasound, Ionotophoresis 4mg /ml Dexamethasone, Manual therapy, and Re-evaluation  PLAN FOR NEXT SESSION:  Add weight bearing/resistance training for osteopenia as appropriate.   Gentle shoulder strengthening.     Rennie Natter, PT, DPT 06/09/2022, 12:18 PM   PHYSICAL THERAPY DISCHARGE SUMMARY  Visits from Start of Care: 17  Current functional level related to goals / functional outcomes: See above, significant improvement in low back pain.    Remaining deficits: See above   Education / Equipment: HEP   Plan:  Patient goals were not met. Patient is being discharged due to not being able to return to therapy after 06/09/22 due to illness.  She will require new order to return to therapy as it has been more than 30 days since she has been seen.       Rennie Natter, PT, DPT 5:42 PM 08/05/2022

## 2022-06-10 NOTE — Telephone Encounter (Signed)
Returned call to patient. She stated that she was advised by [employer] that renewal application is missing prescription page despite us faxing the entire application together. Called [employer] to confirm. Per rep, there is no information missing. He will move application forward.  Carlos Heber, PharmD, MPH, BCPS, CPP Clinical Pharmacist (Rheumatology and Pulmonology) 

## 2022-06-10 NOTE — Telephone Encounter (Signed)
Returned call to patient. She stated that she was advised by [employer] that renewal application is missing prescription page despite [place] faxing the entire application together. Called [employer] to confirm. Per rep, there is no information missing. He will move application forward.  Chesley Mires, PharmD, MPH, BCPS, CPP Clinical Pharmacist (Rheumatology and Pulmonology)

## 2022-06-11 NOTE — Telephone Encounter (Signed)
Received a fax from  [employer] regarding an approval for ENBREL patient assistance from 06/10/2022 to 06/21/2023. Approval letter sent to scan center.  Phone number: [phone]  Chesley Mires, PharmD, MPH, BCPS, CPP Clinical Pharmacist (Rheumatology and Pulmonology)

## 2022-06-23 ENCOUNTER — Encounter: Payer: Medicare Other | Admitting: Physical Therapy

## 2022-07-05 NOTE — Progress Notes (Signed)
Office Visit Note  [name]             Date of Birth: 1955-11-13           MRN: [id number]             PCP: Reynold Bowen, MD Referring: Reynold Bowen, MD Visit Date: 07/19/2022 Occupation: @GUAROCC @  Subjective:  Recent infections   History of Present Illness: Becky [name] is a 67 y.o. female with history of seropositive rheumatoid arthritis, sjogren's syndrome, and osteoarthritis.  Patient is prescribed Enbrel 50 mg subcutaneous injections once weekly.  Patient reports that earlier this month she was diagnosed with COVID-19 followed by secondary upper respiratory tract infection.  She states she had a hold Enbrel for 2 weeks during that time.  She states that she then restarted Enbrel for 1 dose but since then has had to hold ramipril last Monday and today due to having a staph infection on her left upper arm.  She was evaluated by dermatology and was started on a 10-day course of doxycycline.  Patient reports that the staph infection has gradually been improving.  No signs of cellulitis today.  She denies any signs or symptoms of a rheumatoid arthritis flare while off of Enbrel.  She is not currently experiencing a joint swelling.  She has some stiffness in her hands first thing in the morning.  She does have chronic pain and locking of the left thumb due to a trigger thumb.  In the past she had a cortisone injection which provided temporary relief but her symptoms have returned.  She denies any iritis flares.  She has been experiencing some increased stiffness in both knee joints and would like to reapply for Visco gel injections for both knees. She has been going to physical therapy for management of lower back pain which has been helpful. Patient reports that overall her sicca symptoms have been well-controlled.  She denies any shortness of breath or new pulmonary symptoms.  She has not had any swollen lymph nodes.     Activities of Daily Living:  Patient reports  morning stiffness for 30 minutes.   Patient Reports nocturnal pain.  Difficulty dressing/grooming: Denies Difficulty climbing stairs: Denies Difficulty getting out of chair: Denies Difficulty using hands for taps, buttons, cutlery, and/or writing: Reports  Review of Systems  Constitutional:  Positive for fatigue.  HENT:  Positive for mouth dryness. Negative for mouth sores.   Eyes:  Positive for dryness.  Respiratory:  Negative for shortness of breath.   Cardiovascular:  Negative for chest pain and palpitations.  Gastrointestinal:  Positive for constipation. Negative for blood in stool and diarrhea.  Endocrine: Negative for increased urination.  Genitourinary:  Negative for involuntary urination.  Musculoskeletal:  Positive for joint pain, joint pain, myalgias, morning stiffness, muscle tenderness and myalgias. Negative for gait problem, joint swelling and muscle weakness.  Skin:  Positive for rash. Negative for color change, hair loss and sensitivity to sunlight.  Allergic/Immunologic: Positive for susceptible to infections.  Neurological:  Negative for dizziness and headaches.  Hematological:  Negative for swollen glands.  Psychiatric/Behavioral:  Negative for depressed mood and sleep disturbance. The patient is not nervous/anxious.     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 determined by examination associated with type 1 diabetes mellitus ([place]) 03/18/2020   Stable treated proliferative diabetic retinopathy of right eye determined by examination associated with  type 2 diabetes mellitus ([place]) 03/18/2020   History of vitrectomy 40/98/1191   Complicated UTI (urinary tract infection) 12/13/2018   Failure of outpatient treatment 47/82/9562   Acute metabolic encephalopathy 13/01/6577   Fever 12/10/2018   At high risk for breast cancer 05/09/2018   Eosinophilia 01/31/2017   Iritis 01/04/2017   History of joint swelling 08/24/2016   Osteoarthritis of knees, bilateral 08/24/2016   History of migraine 08/24/2016   History of depression 08/24/2016   History of osteoporosis 08/24/2016   DDD (degenerative disc disease), lumbar 08/24/2016   High risk medication use 08/24/2016   Trigger finger, left middle finger 08/24/2016   Trigger finger, right ring finger 08/24/2016   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   Increased risk of breast cancer 07/06/2015   IDDM (insulin dependent diabetes mellitus) 07/06/2015   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 without organ or systems involvement ([place]) 07/06/2015   Sjogren's syndrome ([place]) 07/06/2015   Genetic testing 05/12/2015   Family history of breast cancer 04/15/2015   Fibrocystic breast disease 10/11/2012   GERD 10/01/2009   Chest pain with moderate risk for cardiac etiology 09/02/2009   DYSPHAGIA 09/02/2009   ANXIETY 08/15/2009   ULCER-GASTRIC 08/15/2009   CONSTIPATION 08/15/2009   SLOW TRANSIT CONSTIPATION 08/15/2009   FLATULENCE ERUCTATION AND GAS PAIN 08/15/2009    Past Medical History:  Diagnosis Date   Abnormal ECG    Anemia    Anxiety    Cataract    . Cateracts bil eyes removed   GERD (gastroesophageal reflux disease)    Heart murmur    History of COVID-19 06/14/2020   Hypercholesterolemia    IDDM (insulin dependent diabetes mellitus) 1969   Migraine headache    Neuromuscular disorder (HCC)    bil legs & feet - neuropathy   Neuropathy    Osteoporosis    10/2012   Otosclerosis    Pigmented purpura ([place])    RA (rheumatoid arthritis) ([place]) 2000   S/P cardiac cath  01/2012   Normal coronaries and normal LV function.   Sjoegren syndrome     Family History  Problem Relation Age of Onset   Breast cancer Mother 64   Colon polyps Mother        unspecified number   Uterine cancer Mother        metastatic   Endometrial cancer Mother 37   Stroke Father 78   Rheum arthritis Sister    Breast cancer Sister 15       lump; (-)GT (breast/ova ca panel, [facility])   Breast cancer Maternal Grandmother 51   Skin cancer Maternal Grandfather        unspecified type   Prostate cancer Maternal Grandfather        dx. late 39s   Breast cancer Cousin        bilateral; dx. late 82s   Breast cancer Other        dx. in their late 60s   Breast cancer Other        dx. 78-79   Colon cancer Neg Hx    Esophageal cancer Neg Hx    Rectal cancer Neg Hx    Stomach cancer Neg Hx  Past Surgical History:  Procedure Laterality Date   back ablation  03/2020   back ablation  10/09/2020   BREAST LUMPECTOMY  1995   CARPAL TUNNEL RELEASE  2000   CATARACT EXTRACTION Right 09-05-12   CATARACT EXTRACTION Left 3/15   CESAREAN SECTION  1987   COLONOSCOPY     EYE SURGERY     diabetic retinopathy    HYSTEROSCOPY  02/2001   and D & C   LYMPH NODE DISSECTION  2002   TOE SURGERY  2000   bone removed   Social History   Social History Narrative   Not on file   Immunization History  Administered Date(s) Administered   PFIZER(Purple Top)SARS-COV-2 Vaccination 07/11/2019, 08/01/2019, 02/05/2020, 09/15/2020   Tdap 06/21/2013     Objective: Vital Signs: BP 135/82 (BP Location: Right Arm, Patient Position: Sitting, Cuff Size: Normal)   Pulse 85   Resp 14   Ht 5\' 4"  (1.626 m)   Wt 128 lb 6.4 oz (58.2 kg)   LMP 09/19/2008   BMI 22.04 kg/m    Physical Exam Vitals and nursing note reviewed.  Constitutional:      Appearance: She is well-developed.  HENT:     Head: Normocephalic and atraumatic.  Eyes:     Conjunctiva/sclera: Conjunctivae normal.  Cardiovascular:      Rate and Rhythm: Normal rate and regular rhythm.     Heart sounds: Normal heart sounds.  Pulmonary:     Effort: Pulmonary effort is normal.     Breath sounds: Normal breath sounds.  Abdominal:     General: Bowel sounds are normal.     Palpations: Abdomen is soft.  Musculoskeletal:     Cervical back: Normal range of motion.  Lymphadenopathy:     Cervical: No cervical adenopathy.  Skin:    General: Skin is warm and dry.     Capillary Refill: Capillary refill takes less than 2 seconds.  Neurological:     Mental Status: She is alert and oriented to person, place, and time.  Psychiatric:        Behavior: Behavior normal.      Musculoskeletal Exam: C-spine has limited range of motion with lateral rotation.  Painful range of motion of the lumbar spine.  Shoulder joints, elbow joints, wrist joints, MCPs, PIPs, DIPs have good range of motion with no synovitis.  Left trigger thumb noted.  PIP and DIP thickening consistent with osteoarthritis of both hands.  Complete fist formation bilaterally.  Hip joints have good range of motion with no groin pain.  Knee joints have good range of motion with no warmth or effusion.  Ankle joints have good range of motion with no tenderness or synovitis.  CDAI Exam: CDAI Score: -- Patient Global: 2 mm; Provider Global: 2 mm Swollen: --; Tender: -- Joint Exam 07/19/2022   No joint exam has been documented for this visit   There is currently no information documented on the homunculus. Go to the Rheumatology activity and complete the homunculus joint exam.  Investigation: No additional findings.  Imaging: No results found.  Recent Labs: Lab Results  Component Value Date   WBC 7.4 04/08/2022   HGB 14.3 04/08/2022   PLT 273 04/08/2022   NA 141 04/08/2022   K 4.3 04/08/2022   CL 106 04/08/2022   CO2 26 04/08/2022   GLUCOSE 135 (H) 04/08/2022   BUN 15 04/08/2022   CREATININE 0.99 04/08/2022   BILITOT 0.4 04/08/2022   ALKPHOS 79 05/13/2020   AST  24 04/08/2022   ALT 19 04/08/2022   PROT 6.9 04/08/2022   ALBUMIN 3.9 05/13/2020   CALCIUM 9.8 04/08/2022   GFRAA 66 12/17/2020   QFTBGOLD NEGATIVE 04/06/2017   QFTBGOLDPLUS NEGATIVE 07/10/2021    Speciality Comments: Recieves Enbrel [employer] through [employer]. Prolia start 01/12/21  Procedures:  No procedures performed Allergies: Chlorhexidine, Denosumab, Macrobid [nitrofurantoin macrocrystal], Molnupiravir, Ciprofloxacin, and Penicillins     Assessment / Plan:     Visit Diagnoses: 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 without organ or systems involvement ([place]): She has no joint tenderness or synovitis on examination today.  She has not any signs or symptoms of a rheumatoid arthritis flare.  She has clinically been doing well on Enbrel 50 mg sq injections once weekly.  She has recently had several gaps in therapy due to recent infections including COVID-19, sinusitis, and a staph infection on her left upper arm.  She has not had any signs or symptoms of a flare while holding Enbrel.  She is currently being treated with doxycycline for the staph infection x 10 days.  She was advised to hold her dose of Enbrel today until the infection is completely cleared and she has completed the course of doxycycline.  Discussed starting to space the dose of Enbrel to every 10 days to try to reduce the risk of immunosuppression given her history of recurrent infections and underlying diabetes.  She was in agreement.  She was advised to notify us if she develops increased joint pain or joint swelling as she is spacing the dose of Enbrel to every 10 days.  She will follow-up in the office in 5 months or sooner if needed.  High risk medication use - Enbrel 50 mg sq injections once every 10 days.  Discontinued methotrexate in May 2021. Recommended starting to space Enbrel by every 10 days due to frequent infections.  She was in agreement.  She will not restart Enbrel  until the staph infection has completely cleared and she has completed the course of doxycycline prescribed by her dermatologist. CBC and CMP updated on 04/08/22. Orders for CBC and CMP released today.  TB gold negative on 07/10/21. Order for TB gold released today.  Patient is currently being treated with a 10-day course of doxycycline for management of a staph infection on her left upper arm.  Discussed the importance of holding enbrel if she develops signs or symptoms of an infection and to resume once the infection has completely cleared.   - Plan: CBC with Differential/Platelet, COMPLETE METABOLIC PANEL WITH GFR, QuantiFERON-TB Gold Plus  Iritis: She has not had any signs or symptoms of an iritis flare.  No conjunctival injection noted today.  Screening for tuberculosis -Order for TB gold released today.  Plan: QuantiFERON-TB Gold Plus  Sjogren's syndrome with keratoconjunctivitis sicca ([place]) -Stable.  Overall her sicca symptoms have been tolerable.  She has not been experiencing any new or worsening pulmonary symptoms.  No parotid swelling or tenderness.  No cervical lymphadenopathy noted today.  Discussed the 4-14 fold increased risk of developing lymphoma in patients with Sjogren's syndrome.  I also discussed the association of ILD in patients with Sjogren's syndrome.  On examination she has no signs of inflammatory arthritis at this time.  No synovitis was noted on exam.  She was advised to notify us if she develops any new or worsening symptoms. The following lab work will be obtained today for further evaluation.  plan: Sjogrens syndrome-B extractable nuclear antibody,  Sjogrens syndrome-A extractable nuclear antibody, Rheumatoid factor, ANA, Sedimentation rate, Serum protein electrophoresis with reflex, C3 and C4, Urinalysis, Routine w reflex microscopic  Trigger thumb, left thumb: She continues to experience tenderness and locking of the left thumb intermittently.  She declined scheduling an  ultrasound-guided left trigger thumb injection at this time.  She will notify us if her symptoms persist or worsen.  Primary osteoarthritis of both knees - s/p synvisc bilateral knees, 08/2021.  She had good response to Visco supplement injections.  She is started to notice increased stiffness in both knee joints.  She would like to reapply for Visco gel injections for both knees.  X-rays of both knees were updated today to assess for radiographic progression.  This patient is diagnosed with osteoarthritis of the knee(s).    Radiographs show evidence of joint space narrowing, osteophytes, subchondral sclerosis and/or subchondral cysts.  This patient has knee pain which interferes with functional and activities of daily living.    This patient has experienced inadequate response, adverse effects and/or intolerance with conservative treatments such as acetaminophen, NSAIDS, topical creams, physical therapy or regular exercise, knee bracing and/or weight loss.   This patient has experienced inadequate response or has a contraindication to intra articular steroid injections for at least 3 months.   This patient is not scheduled to have a total knee replacement within 6 months of starting treatment with viscosupplementation.  Chronic pain of both knees -She has started to experience increased pain and stiffness in both knee joints.  On examination today she has good range of motion of both knee joints with no warmth or effusion. X-rays of both knees were updated today to assess for radiographic progression.  We will reapply for Visco gel injections for both knee joints which have been helpful at alleviating her joint pain and stiffness in the past. Plan: XR KNEE 3 VIEW RIGHT, XR KNEE 3 VIEW LEFT  DDD (degenerative disc disease), cervical: C-spine has slightly limited range of motion with lateral rotation.  She has started physical therapy for her neck and lower back which has been helpful in alleviating  her discomfort and improving her range of motion.  DDD (degenerative disc disease), lumbar: Her lower back pain has been improving since starting physical therapy.  Age-related osteoporosis without current pathological fracture - DEXA ordered by Dr. Evlyn Kanner.  Prolia injections x3.  Her most recent injection was administered on 07/27/21.  Patient has discontinued Prolia due to side effects.  Rash and other nonspecific skin eruption -patient is currently being treated for a staph infection on her left upper arm.  Her dermatologist started her on a course of doxycycline x 10 days.  She was advised to continue to hold Enbrel until the staph infection has completely resolved and she has completed the course of doxycycline.  Other medical conditions are listed as follows:  History of migraine  Other fatigue  History of gastroesophageal reflux (GERD)  History of diabetes mellitus  History of depression   Orders: Orders Placed This Encounter  Procedures   XR KNEE 3 VIEW RIGHT   XR KNEE 3 VIEW LEFT   CBC with Differential/Platelet   COMPLETE METABOLIC PANEL WITH GFR   QuantiFERON-TB Gold Plus   Sjogrens syndrome-B extractable nuclear antibody   Sjogrens syndrome-A extractable nuclear antibody   Rheumatoid factor   ANA   Sedimentation rate   Serum protein electrophoresis with reflex   C3 and C4   Urinalysis, Routine w reflex microscopic   No orders of the  defined types were placed in this encounter.    Follow-Up Instructions: Return in about 5 months (around 12/18/2022) for Rheumatoid arthritis, Sjogren's syndrome, Osteoarthritis.   Ofilia Neas, PA-C  Note - This record has been created using Dragon software.  Chart creation errors have been sought, but may not always  have been located. Such creation errors do not reflect on  the standard of medical care.

## 2022-07-07 ENCOUNTER — Encounter: Payer: Medicare Other | Admitting: Physical Therapy

## 2022-07-12 DIAGNOSIS — L570 Actinic keratosis: Secondary | ICD-10-CM | POA: Diagnosis not present

## 2022-07-12 DIAGNOSIS — L2089 Other atopic dermatitis: Secondary | ICD-10-CM | POA: Diagnosis not present

## 2022-07-19 ENCOUNTER — Ambulatory Visit: Payer: Medicare Other | Attending: Physician Assistant | Admitting: Physician Assistant

## 2022-07-19 ENCOUNTER — Ambulatory Visit: Payer: Medicare Other

## 2022-07-19 ENCOUNTER — Ambulatory Visit (INDEPENDENT_AMBULATORY_CARE_PROVIDER_SITE_OTHER): Payer: Medicare Other

## 2022-07-19 ENCOUNTER — Encounter: Payer: Self-pay | Admitting: Physician Assistant

## 2022-07-19 VITALS — BP 135/82 | HR 85 | Resp 14 | Ht 64.0 in | Wt 128.4 lb

## 2022-07-19 DIAGNOSIS — Z8719 Personal history of other diseases of the digestive system: Secondary | ICD-10-CM

## 2022-07-19 DIAGNOSIS — Z111 Encounter for screening for respiratory tuberculosis: Secondary | ICD-10-CM | POA: Diagnosis not present

## 2022-07-19 DIAGNOSIS — Z8669 Personal history of other diseases of the nervous system and sense organs: Secondary | ICD-10-CM

## 2022-07-19 DIAGNOSIS — M25561 Pain in right knee: Secondary | ICD-10-CM

## 2022-07-19 DIAGNOSIS — M25562 Pain in left knee: Secondary | ICD-10-CM

## 2022-07-19 DIAGNOSIS — M65312 Trigger thumb, left thumb: Secondary | ICD-10-CM

## 2022-07-19 DIAGNOSIS — H209 Unspecified iridocyclitis: Secondary | ICD-10-CM

## 2022-07-19 DIAGNOSIS — M5136 Other intervertebral disc degeneration, lumbar region: Secondary | ICD-10-CM

## 2022-07-19 DIAGNOSIS — Z79899 Other long term (current) drug therapy: Secondary | ICD-10-CM

## 2022-07-19 DIAGNOSIS — M3501 Sicca syndrome with keratoconjunctivitis: Secondary | ICD-10-CM

## 2022-07-19 DIAGNOSIS — G8929 Other chronic pain: Secondary | ICD-10-CM

## 2022-07-19 DIAGNOSIS — M5030 Other cervical disc degeneration, unspecified cervical region: Secondary | ICD-10-CM

## 2022-07-19 DIAGNOSIS — M170 Bilateral primary osteoarthritis of knee: Secondary | ICD-10-CM

## 2022-07-19 DIAGNOSIS — Z8659 Personal history of other mental and behavioral disorders: Secondary | ICD-10-CM

## 2022-07-19 DIAGNOSIS — R21 Rash and other nonspecific skin eruption: Secondary | ICD-10-CM

## 2022-07-19 DIAGNOSIS — M0579 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 without organ or systems involvement: Secondary | ICD-10-CM | POA: Diagnosis not present

## 2022-07-19 DIAGNOSIS — M810 Age-related osteoporosis without current pathological fracture: Secondary | ICD-10-CM

## 2022-07-19 DIAGNOSIS — Z8639 Personal history of other endocrine, nutritional and metabolic disease: Secondary | ICD-10-CM

## 2022-07-19 DIAGNOSIS — R5383 Other fatigue: Secondary | ICD-10-CM

## 2022-07-20 ENCOUNTER — Telehealth: Payer: Self-pay

## 2022-07-20 NOTE — Telephone Encounter (Signed)
Please apply for bilateral knee visco, per Taylor Dale, PA-C. Thanks!  

## 2022-07-20 NOTE — Progress Notes (Signed)
X-rays of both knees are consistent with mild OA.  Please reapply for visco injections for both knees

## 2022-07-20 NOTE — Progress Notes (Signed)
CBC stable.  Glucose is elevated-176. Rest of CMP WNL. RF remains positive.   ESR WNL.  C3 borderline low.  C4 WNL.  We will continue to monitor.  UA normal.

## 2022-07-21 LAB — ANA: Anti Nuclear Antibody (ANA): POSITIVE — AB

## 2022-07-21 LAB — COMPLETE METABOLIC PANEL WITH GFR
AG Ratio: 1.4 (calc) (ref 1.0–2.5)
ALT: 15 U/L (ref 6–29)
AST: 21 U/L (ref 10–35)
Albumin: 4.1 g/dL (ref 3.6–5.1)
Alkaline phosphatase (APISO): 66 U/L (ref 37–153)
BUN: 19 mg/dL (ref 7–25)
CO2: 26 mmol/L (ref 20–32)
Calcium: 9.8 mg/dL (ref 8.6–10.4)
Chloride: 106 mmol/L (ref 98–110)
Creat: 0.95 mg/dL (ref 0.50–1.05)
Globulin: 3 g/dL (calc) (ref 1.9–3.7)
Glucose, Bld: 176 mg/dL — ABNORMAL HIGH (ref 65–99)
Potassium: 3.9 mmol/L (ref 3.5–5.3)
Sodium: 141 mmol/L (ref 135–146)
Total Bilirubin: 0.5 mg/dL (ref 0.2–1.2)
Total Protein: 7.1 g/dL (ref 6.1–8.1)
eGFR: 66 mL/min/{1.73_m2} (ref >=60)

## 2022-07-21 LAB — SEDIMENTATION RATE: Sed Rate: 9 mm/h (ref 0–30)

## 2022-07-21 LAB — CBC WITH DIFFERENTIAL/PLATELET
Absolute Monocytes: 319 cells/uL (ref 200–950)
Basophils Absolute: 88 cells/uL (ref 0–200)
Basophils Relative: 1.6 %
Eosinophils Absolute: 556 cells/uL — ABNORMAL HIGH (ref 15–500)
Eosinophils Relative: 10.1 %
HCT: 41.1 % (ref 35.0–45.0)
Hemoglobin: 13.8 g/dL (ref 11.7–15.5)
Lymphs Abs: 1755 cells/uL (ref 850–3900)
MCH: 30.1 pg (ref 27.0–33.0)
MCHC: 33.6 g/dL (ref 32.0–36.0)
MCV: 89.5 fL (ref 80.0–100.0)
MPV: 13.9 fL — ABNORMAL HIGH (ref 7.5–12.5)
Monocytes Relative: 5.8 %
Neutro Abs: 2783 cells/uL (ref 1500–7800)
Neutrophils Relative %: 50.6 %
Platelets: 215 10*3/uL (ref 140–400)
RBC: 4.59 10*6/uL (ref 3.80–5.10)
RDW: 13 % (ref 11.0–15.0)
Total Lymphocyte: 31.9 %
WBC: 5.5 10*3/uL (ref 3.8–10.8)

## 2022-07-21 LAB — URINALYSIS, ROUTINE W REFLEX MICROSCOPIC
Bilirubin Urine: NEGATIVE
Glucose, UA: NEGATIVE
Hgb urine dipstick: NEGATIVE
Ketones, ur: NEGATIVE
Leukocytes,Ua: NEGATIVE
Nitrite: NEGATIVE
Protein, ur: NEGATIVE
Specific Gravity, Urine: 1.011 (ref 1.001–1.035)
pH: 7 (ref 5.0–8.0)

## 2022-07-21 LAB — PROTEIN ELECTROPHORESIS, SERUM, WITH REFLEX
Albumin ELP: 3.9 g/dL (ref 3.8–4.8)
Alpha 1: 0.2 g/dL (ref 0.2–0.3)
Alpha 2: 0.7 g/dL (ref 0.5–0.9)
Beta 2: 0.4 g/dL (ref 0.2–0.5)
Beta Globulin: 0.4 g/dL (ref 0.4–0.6)
Gamma Globulin: 1.1 g/dL (ref 0.8–1.7)
Total Protein: 6.8 g/dL (ref 6.1–8.1)

## 2022-07-21 LAB — RHEUMATOID FACTOR: Rheumatoid fact SerPl-aCnc: 194 IU/mL — ABNORMAL HIGH (ref <14)

## 2022-07-21 LAB — C3 AND C4
C3 Complement: 82 mg/dL — ABNORMAL LOW (ref 83–193)
C4 Complement: 18 mg/dL (ref 15–57)

## 2022-07-21 LAB — QUANTIFERON-TB GOLD PLUS
Mitogen-NIL: >10 IU/mL
NIL: 0.02 IU/mL
QuantiFERON-TB Gold Plus: NEGATIVE
TB1-NIL: 0.02 IU/mL
TB2-NIL: 0.03 IU/mL

## 2022-07-21 LAB — SJOGRENS SYNDROME-B EXTRACTABLE NUCLEAR ANTIBODY: SSB (La) (ENA) Antibody, IgG: <1 AI

## 2022-07-21 LAB — ANTI-NUCLEAR AB-TITER (ANA TITER): ANA Titer 1: 1:40 {titer} — ABNORMAL HIGH

## 2022-07-21 LAB — SJOGRENS SYNDROME-A EXTRACTABLE NUCLEAR ANTIBODY: SSA (Ro) (ENA) Antibody, IgG: <1 AI

## 2022-07-21 NOTE — Progress Notes (Signed)
SPEP did not reveal any abnormal protein bands.

## 2022-07-21 NOTE — Progress Notes (Signed)
ANA remains positive-low titer.

## 2022-07-21 NOTE — Progress Notes (Signed)
[place] antibodies negative

## 2022-07-22 NOTE — Progress Notes (Signed)
TB gold negative

## 2022-08-02 DIAGNOSIS — L308 Other specified dermatitis: Secondary | ICD-10-CM | POA: Diagnosis not present

## 2022-08-02 DIAGNOSIS — L503 Dermatographic urticaria: Secondary | ICD-10-CM | POA: Diagnosis not present

## 2022-08-03 NOTE — Telephone Encounter (Signed)
VOB submitted for Orthovisc, Bilateral knee(s) BV pending

## 2022-08-04 NOTE — Telephone Encounter (Signed)
Reached out to the patient to schedule Orthovisc injections. Patient states she is not at home and will call back once she has her schedule.

## 2022-08-04 NOTE — Telephone Encounter (Signed)
Please call to schedule visco injections.  Approved for Orthovisc, Bilateral knee(s). [place] Once OOP has been met patient is covered at 100% $15 co-pay Deductible does not apply No pre-certifications

## 2022-08-12 DIAGNOSIS — E1040 Type 1 diabetes mellitus with diabetic neuropathy, unspecified: Secondary | ICD-10-CM | POA: Diagnosis not present

## 2022-08-12 DIAGNOSIS — N1831 Chronic kidney disease, stage 3a: Secondary | ICD-10-CM | POA: Diagnosis not present

## 2022-08-12 DIAGNOSIS — I129 Hypertensive chronic kidney disease with stage 1 through stage 4 chronic kidney disease, or unspecified chronic kidney disease: Secondary | ICD-10-CM | POA: Diagnosis not present

## 2022-08-12 DIAGNOSIS — E785 Hyperlipidemia, unspecified: Secondary | ICD-10-CM | POA: Diagnosis not present

## 2022-08-16 DIAGNOSIS — Z794 Long term (current) use of insulin: Secondary | ICD-10-CM | POA: Diagnosis not present

## 2022-08-16 DIAGNOSIS — E109 Type 1 diabetes mellitus without complications: Secondary | ICD-10-CM | POA: Diagnosis not present

## 2022-08-18 DIAGNOSIS — L503 Dermatographic urticaria: Secondary | ICD-10-CM | POA: Diagnosis not present

## 2022-08-19 DIAGNOSIS — L298 Other pruritus: Secondary | ICD-10-CM | POA: Diagnosis not present

## 2022-08-19 DIAGNOSIS — L308 Other specified dermatitis: Secondary | ICD-10-CM | POA: Diagnosis not present

## 2022-08-19 DIAGNOSIS — R21 Rash and other nonspecific skin eruption: Secondary | ICD-10-CM | POA: Diagnosis not present

## 2022-09-02 DIAGNOSIS — G518 Other disorders of facial nerve: Secondary | ICD-10-CM | POA: Diagnosis not present

## 2022-09-02 DIAGNOSIS — M542 Cervicalgia: Secondary | ICD-10-CM | POA: Diagnosis not present

## 2022-09-02 DIAGNOSIS — L308 Other specified dermatitis: Secondary | ICD-10-CM | POA: Diagnosis not present

## 2022-09-02 DIAGNOSIS — G43719 Chronic migraine without aura, intractable, without status migrainosus: Secondary | ICD-10-CM | POA: Diagnosis not present

## 2022-09-02 DIAGNOSIS — M791 Myalgia, unspecified site: Secondary | ICD-10-CM | POA: Diagnosis not present

## 2022-09-29 ENCOUNTER — Telehealth: Payer: Self-pay

## 2022-09-29 DIAGNOSIS — Z9189 Other specified personal risk factors, not elsewhere classified: Secondary | ICD-10-CM

## 2022-09-29 NOTE — Telephone Encounter (Signed)
Ok to order breast MRI with contrast.  Patient has increased risk of breast cancer, 26.7% calculated by [facility] on 04/12/22.

## 2022-09-29 NOTE — Telephone Encounter (Signed)
Patient states she needs order for Breast MRI.  Last Breast MRI 07/16/21. Neg MMG  04/12/2022.  Please advise if okay to order.

## 2022-09-30 NOTE — Telephone Encounter (Signed)
Bilateral Breast MRI scheduled 10/26/22 at [facility].  Routed to Larkin Ina to inquire re: insurance PA.

## 2022-09-30 NOTE — Telephone Encounter (Signed)
Order placed.  Per DPR access note on file left detailed message in her voice mail that order has been placed and she can call to schedule. I left her the phone number and prompts to the MRI scheduler.

## 2022-10-04 ENCOUNTER — Telehealth: Payer: Self-pay

## 2022-10-04 ENCOUNTER — Encounter: Payer: Self-pay | Admitting: Obstetrics and Gynecology

## 2022-10-04 ENCOUNTER — Ambulatory Visit (INDEPENDENT_AMBULATORY_CARE_PROVIDER_SITE_OTHER): Payer: Medicare Other | Admitting: Obstetrics and Gynecology

## 2022-10-04 VITALS — BP 124/70 | HR 93 | Ht 63.5 in | Wt 130.0 lb

## 2022-10-04 DIAGNOSIS — Z124 Encounter for screening for malignant neoplasm of cervix: Secondary | ICD-10-CM

## 2022-10-04 DIAGNOSIS — N950 Postmenopausal bleeding: Secondary | ICD-10-CM | POA: Diagnosis not present

## 2022-10-04 NOTE — Telephone Encounter (Signed)
Please schedule an office visit with me.  Thank you!

## 2022-10-04 NOTE — Patient Instructions (Signed)
Postmenopausal Bleeding Postmenopausal bleeding is any bleeding that a woman has after she has entered menopause. Menopause is the end of a woman's fertile years. After menopause, a woman no longer ovulates and does not have menstrual periods. Therefore, she should no longer have bleeding from her vagina. Postmenopausal bleeding may have various causes, including: Menopausal hormone therapy (MHT). Endometrial atrophy. After menopause, low estrogen hormone levels cause the membrane that lines the uterus (endometrium) to become thin. You may have bleeding as the endometrium thins. Endometrial hyperplasia. This condition is caused by excess estrogen hormones and low levels of progesterone hormones. The excess estrogen causes the endometrium to thicken, which can lead to bleeding. In some cases, this can lead to cancer of the uterus. Endometrial cancer. Noncancerous growths (polyps) on the endometrium, the lining of the uterus, or the cervix. Uterine fibroids. These are noncancerous growths in or around the uterus muscle tissue that can cause heavy bleeding. Any type of postmenopausal bleeding, even if it appears to be a typical menstrual period, should be checked by your health care provider. Treatment will depend on the cause of the bleeding. Follow these instructions at home:  Pay attention to any changes in your symptoms. Let your health care provider know about them. Avoid using tampons and douches as told by your health care provider. Change your pads regularly. Get regular pelvic exams, including Pap tests, as told by your health care provider. Take iron supplements as told by your health care provider. Take over-the-counter and prescription medicines only as told by your health care provider. Keep all follow-up visits. This is important. Contact a health care provider if: You have new bleeding from the vagina after menopause. You have pain in your abdomen. Get help right away if: You have  a fever or chills. You have severe pain with bleeding. You are passing blood clots. You have heavy bleeding, need more than 1 pad an hour, and have never experienced this before. You have headaches or feel faint or dizzy. Summary Postmenopausal bleeding is any bleeding that a woman has after she has entered into menopause. Postmenopausal bleeding may have various causes. Treatment will depend on the cause of the bleeding. Any type of postmenopausal bleeding, even if it appears to be a typical menstrual period, should be checked by your health care provider. Be sure to pay attention to any changes in your symptoms and keep all follow-up visits. This information is not intended to replace advice given to you by your health care provider. Make sure you discuss any questions you have with your health care provider. Document Revised: 11/22/2019 Document Reviewed: 11/22/2019 Elsevier Patient Education  2023 Elsevier Inc.  

## 2022-10-04 NOTE — Telephone Encounter (Signed)
Appointment scheduled for this afternoon.

## 2022-10-04 NOTE — Telephone Encounter (Signed)
Patient called to report that she saw some "brownish discharge this weekend. "It resembled dry blood". Not very much at all.  Rec?

## 2022-10-04 NOTE — Telephone Encounter (Signed)
Message sent to appt pool to let patient know Dr. Edward Jolly rec OV and to schedule her appt.

## 2022-10-04 NOTE — Progress Notes (Signed)
GYNECOLOGY  VISIT   HPI: 67 y.o.   Married  Caucasian  female   G1P1001 with Patient's last menstrual period was 09/19/2008.   here for   brown discharge. Pt noticed yellowy discharge a week ago. Pt noticed brown discharge on Sat.  Patient is not taking Tamoxifen currently.  She took it temporarily, and discontinued the medication for breat cancer prevention.   Mother passed of uterine cancer.   Takes care of her Dad. Experiencing stress.   GYNECOLOGIC HISTORY: Patient's last menstrual period was 09/19/2008. Contraception:  PMP Menopausal hormone therapy:  n/a Last mammogram:  04/12/22 Breast Density Cat D, BI-RADS CAT 2 benign Last pap smear:   12/18/20 neg, 09/21/17 neg: HR HPV neg        OB History     Gravida  1   Para  1   Term  1   Preterm  0   AB  0   Living  1      SAB  0   IAB  0   Ectopic  0   Multiple  0   Live Births  1              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 determined by examination associated with type 1 diabetes mellitus 03/18/2020   Stable treated proliferative diabetic retinopathy of right eye determined by examination associated with type 2 diabetes mellitus 03/18/2020   History of vitrectomy 03/18/2020   Complicated UTI (urinary tract infection) 12/13/2018   Failure of outpatient treatment 12/11/2018   Acute metabolic encephalopathy 12/11/2018   Fever 12/10/2018   At high risk for breast cancer 05/09/2018   Eosinophilia 01/31/2017   Iritis 01/04/2017   History of joint swelling 08/24/2016   Osteoarthritis of knees,  bilateral 08/24/2016   History of migraine 08/24/2016   History of depression 08/24/2016   History of osteoporosis 08/24/2016   DDD (degenerative disc disease), lumbar 08/24/2016   High risk medication use 08/24/2016   Trigger finger, left middle finger 08/24/2016   Trigger finger, right ring finger 08/24/2016   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   Increased risk of breast cancer 07/06/2015   IDDM (insulin dependent diabetes mellitus) 07/06/2015   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 without organ or systems involvement 07/06/2015   Sjogren's syndrome 07/06/2015   Genetic testing 05/12/2015   Family history of breast cancer 04/15/2015   Fibrocystic breast disease 10/11/2012   GERD 10/01/2009   Chest pain with moderate risk for cardiac etiology 09/02/2009   DYSPHAGIA 09/02/2009   ANXIETY 08/15/2009   ULCER-GASTRIC 08/15/2009   CONSTIPATION 08/15/2009   SLOW TRANSIT CONSTIPATION 08/15/2009   FLATULENCE ERUCTATION AND GAS PAIN 08/15/2009    Past Medical History:  Diagnosis Date   Abnormal ECG    Anemia    Anxiety    Cataract    . Cateracts bil eyes removed   GERD (gastroesophageal reflux disease)    Heart murmur    History of COVID-19 06/14/2020   Hypercholesterolemia    IDDM (insulin dependent diabetes mellitus) 1969   Migraine headache    Neuromuscular disorder  bil legs & feet - neuropathy   Neuropathy    Osteoporosis    10/2012   Otosclerosis    Pigmented purpura    RA (rheumatoid arthritis) 2000   S/P cardiac cath 01/2012   Normal coronaries and normal LV function.   Sjoegren syndrome     Past Surgical History:  Procedure Laterality Date   back ablation  03/2020   back ablation  10/09/2020   BREAST LUMPECTOMY  1995   CARPAL TUNNEL RELEASE  2000   CATARACT EXTRACTION Right 09-05-12   CATARACT EXTRACTION Left 3/15   CESAREAN SECTION  1987   COLONOSCOPY     EYE SURGERY     diabetic retinopathy    HYSTEROSCOPY  02/2001   and D & C    LYMPH NODE DISSECTION  2002   TOE SURGERY  2000   bone removed    Current Outpatient Medications  Medication Sig Dispense Refill   aspirin 81 MG tablet Take 81 mg by mouth daily.     calcium carbonate (OS-CAL) 600 MG TABS Take 600 mg by mouth 2 (two) times daily with a meal.      Continuous Blood Gluc Sensor (FREESTYLE LIBRE 2 SENSOR) MISC CHANGE EVERY 14 DAYS     CONTOUR NEXT TEST test strip   6   cyclobenzaprine (FLEXERIL) 10 MG tablet Take 10 mg by mouth 3 (three) times daily as needed for muscle spasms. Reported on 07/03/2015     denosumab (PROLIA) 60 MG/ML SOSY injection Inject 60 mg into the skin every 6 (six) months.     doxycycline (VIBRAMYCIN) 100 MG capsule Take 100 mg by mouth 2 (two) times daily.     DULoxetine (CYMBALTA) 30 MG capsule Take 90 mg by mouth daily.     etanercept (ENBREL SURECLICK) 50 MG/ML injection Inject one pen into skin weekly 12 mL 0   famotidine (PEPCID) 20 MG tablet Take 20 mg by mouth 2 (two) times daily.     furosemide (LASIX) 20 MG tablet Take 10 mg by mouth daily.      insulin aspart (NOVOLOG) 100 UNIT/ML injection Insulin pump     losartan (COZAAR) 50 MG tablet Take 50 mg by mouth daily.     polyethylene glycol (MIRALAX / GLYCOLAX) 17 g packet Take 17 g by mouth 2 (two) times daily.      pravastatin (PRAVACHOL) 20 MG tablet Take 20 mg by mouth daily.      SUMAtriptan (IMITREX) 100 MG tablet Take 100 mg by mouth every 2 (two) hours as needed.     topiramate (TOPAMAX) 200 MG tablet Take 200 mg by mouth daily.      triamcinolone cream (KENALOG) 0.1 % APPLY TO AFFECTED AREA TWICE A DAY     vitamin B-12 (CYANOCOBALAMIN) 1000 MCG tablet Take 1,000 mcg by mouth daily.     No current facility-administered medications for this visit.     ALLERGIES: Chlorhexidine, Denosumab, Macrobid [nitrofurantoin macrocrystal], Molnupiravir, Ciprofloxacin, and Penicillins  Family History  Problem Relation Age of Onset   Breast cancer Mother 11   Colon polyps Mother         unspecified number   Uterine cancer Mother        metastatic   Endometrial cancer Mother 93   Stroke Father 65   Rheum arthritis Sister    Breast cancer Sister 48       lump; (-)GT (breast/ova ca panel, GeneDx)   Breast cancer Maternal Grandmother 55   Skin cancer Maternal  Grandfather        unspecified type   Prostate cancer Maternal Grandfather        dx. late 70s   Breast cancer Cousin        bilateral; dx. late 32s   Breast cancer Other        dx. in their late 60s   Breast cancer Other        dx. 78-79   Colon cancer Neg Hx    Esophageal cancer Neg Hx    Rectal cancer Neg Hx    Stomach cancer Neg Hx     Social History   Socioeconomic History   Marital status: Married    Spouse name: Not on file   Number of children: 1   Years of education: Not on file   Highest education level: Not on file  Occupational History   Occupation: disability    Employer: UNEMPLOYED  Tobacco Use   Smoking status: Never    Passive exposure: Never   Smokeless tobacco: Never  Vaping Use   Vaping Use: Never used  Substance and Sexual Activity   Alcohol use: Yes    Alcohol/week: 2.0 standard drinks of alcohol    Types: 1 Shots of liquor, 1 Standard drinks or equivalent per week    Comment: maybe 1-2 cocktails per wk   Drug use: No   Sexual activity: Yes    Partners: Male    Birth control/protection: Post-menopausal    Comment: vasectomy/first intercourse <17  Other Topics Concern   Not on file  Social History Narrative   Not on file   Social Determinants of Health   Financial Resource Strain: Not on file  Food Insecurity: Not on file  Transportation Needs: Not on file  Physical Activity: Not on file  Stress: Not on file  Social Connections: Not on file  Intimate Partner Violence: Not on file    Review of Systems  All other systems reviewed and are negative.   PHYSICAL EXAMINATION:    BP 124/70 (BP Location: Right Arm, Patient Position: Sitting, Cuff Size:  Normal)   Pulse 93   Ht 5' 3.5" (1.613 m)   Wt 130 lb (59 kg)   LMP 09/19/2008   SpO2 97%   BMI 22.67 kg/m     General appearance: alert, cooperative and appears stated age   Pelvic: External genitalia:  no lesions              Urethra:  normal appearing urethra with no masses, tenderness or lesions              Bartholins and Skenes: normal                 Vagina: normal appearing vagina with normal color and tan colored discharge, no lesions.  Atrophy noted.               Cervix: no lesions                Bimanual Exam:  Uterus:  normal size, contour, position, consistency, mobility, non-tender              Adnexa: no mass, fullness, tenderness           Chaperone was present for exam:  Warren Lacy, CMA  ASSESSMENT  Postmenopausal bleeding.  FH uterine cancer.  Negative genetic testing.  Caregiver stress.   PLAN  Causes of postmenopausal bleeding discussed:  atrophy, infection, polyps, precancer and cancer. Pap and HR HPV collected.  Return for pelvic US and EMB. I encourage her to share care of her father with her family.    An After Visit Summary was printed and given to the patient.

## 2022-10-06 NOTE — Progress Notes (Signed)
GYNECOLOGY  VISIT   HPI: 67 y.o.   Married  Caucasian  female   G1P1001 with Patient's last menstrual period was 09/19/2008.   here for  repeat pap and endometrial biopsy.   She has postmenopausal bleeding.   Her pap 10/04/22 was nondiagnostic due to inflammation.   FH uterine cancer in her mother.   GYNECOLOGIC HISTORY: Patient's last menstrual period was 09/19/2008. Contraception:  PMP Menopausal hormone therapy:  n/a Last mammogram:  04/12/22 Breast Density Cat D, BI-RADS CAT 2 benign  Last pap smear:   12/18/20 neg, 09/21/17 neg: HR HPV neg               OB History     Gravida  1   Para  1   Term  1   Preterm  0   AB  0   Living  1      SAB  0   IAB  0   Ectopic  0   Multiple  0   Live Births  1              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 determined by examination associated with type 1 diabetes mellitus (HCC) 03/18/2020   Stable treated proliferative diabetic retinopathy of right eye determined by examination associated with type 2 diabetes mellitus (HCC) 03/18/2020   History of vitrectomy 03/18/2020   Complicated UTI (urinary tract infection) 12/13/2018   Failure of outpatient treatment 12/11/2018   Acute metabolic encephalopathy 12/11/2018   Fever 12/10/2018   At high risk for breast cancer 05/09/2018   Eosinophilia 01/31/2017   Iritis 01/04/2017   History of joint swelling 08/24/2016   Osteoarthritis of knees, bilateral 08/24/2016   History of migraine 08/24/2016   History of depression 08/24/2016   History of osteoporosis  08/24/2016   DDD (degenerative disc disease), lumbar 08/24/2016   High risk medication use 08/24/2016   Trigger finger, left middle finger 08/24/2016   Trigger finger, right ring finger 08/24/2016   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   Increased risk of breast cancer 07/06/2015   IDDM (insulin dependent diabetes mellitus) 07/06/2015   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 without organ or systems involvement (HCC) 07/06/2015   Sjogren's syndrome (HCC) 07/06/2015   Genetic testing 05/12/2015   Family history of breast cancer 04/15/2015   Fibrocystic breast disease 10/11/2012   GERD 10/01/2009   Chest pain with moderate risk for cardiac etiology 09/02/2009   DYSPHAGIA 09/02/2009   ANXIETY 08/15/2009   ULCER-GASTRIC 08/15/2009   CONSTIPATION 08/15/2009   SLOW TRANSIT CONSTIPATION 08/15/2009   FLATULENCE ERUCTATION AND GAS PAIN 08/15/2009    Past Medical History:  Diagnosis Date   Abnormal ECG    Anemia    Anxiety    Cataract    . Cateracts bil eyes removed   GERD (gastroesophageal reflux disease)    Heart murmur    History of COVID-19 06/14/2020   Hypercholesterolemia    IDDM (insulin dependent diabetes mellitus) 1969   Migraine headache    Neuromuscular disorder (HCC)    bil legs & feet - neuropathy   Neuropathy  Osteoporosis    10/2012   Otosclerosis    Pigmented purpura (HCC)    RA (rheumatoid arthritis) (HCC) 2000   S/P cardiac cath 01/2012   Normal coronaries and normal LV function.   Sjoegren syndrome     Past Surgical History:  Procedure Laterality Date   back ablation  03/2020   back ablation  10/09/2020   BREAST LUMPECTOMY  1995   CARPAL TUNNEL RELEASE  2000   CATARACT EXTRACTION Right 09-05-12   CATARACT EXTRACTION Left 3/15   CESAREAN SECTION  1987   COLONOSCOPY     EYE SURGERY     diabetic retinopathy    HYSTEROSCOPY  02/2001   and D & C   LYMPH NODE DISSECTION  2002   TOE SURGERY  2000   bone removed    Current  Outpatient Medications  Medication Sig Dispense Refill   aspirin 81 MG tablet Take 81 mg by mouth daily.     calcium carbonate (OS-CAL) 600 MG TABS Take 600 mg by mouth 2 (two) times daily with a meal.      Continuous Blood Gluc Sensor (FREESTYLE LIBRE 2 SENSOR) MISC CHANGE EVERY 14 DAYS     CONTOUR NEXT TEST test strip   6   cyclobenzaprine (FLEXERIL) 10 MG tablet Take 10 mg by mouth 3 (three) times daily as needed for muscle spasms. Reported on 07/03/2015     DULoxetine (CYMBALTA) 30 MG capsule Take 90 mg by mouth daily.     etanercept (ENBREL SURECLICK) 50 MG/ML injection Inject one pen into skin weekly 12 mL 0   famotidine (PEPCID) 20 MG tablet Take 20 mg by mouth 2 (two) times daily.     furosemide (LASIX) 20 MG tablet Take 10 mg by mouth daily.      insulin aspart (NOVOLOG) 100 UNIT/ML injection Insulin pump     losartan (COZAAR) 50 MG tablet Take 50 mg by mouth daily.     polyethylene glycol (MIRALAX / GLYCOLAX) 17 g packet Take 17 g by mouth 2 (two) times daily.      pravastatin (PRAVACHOL) 20 MG tablet Take 20 mg by mouth daily.      SUMAtriptan (IMITREX) 100 MG tablet Take 100 mg by mouth every 2 (two) hours as needed.     topiramate (TOPAMAX) 200 MG tablet Take 200 mg by mouth daily.      vitamin B-12 (CYANOCOBALAMIN) 1000 MCG tablet Take 1,000 mcg by mouth daily.     No current facility-administered medications for this visit.     ALLERGIES: Chlorhexidine, Denosumab, Macrobid [nitrofurantoin macrocrystal], Molnupiravir, Ciprofloxacin, and Penicillins  Family History  Problem Relation Age of Onset   Breast cancer Mother 74   Colon polyps Mother        unspecified number   Uterine cancer Mother        metastatic   Endometrial cancer Mother 40   Stroke Father 4   Rheum arthritis Sister    Breast cancer Sister 48       lump; (-)GT (breast/ova ca panel, GeneDx)   Breast cancer Maternal Grandmother 25   Skin cancer Maternal Grandfather        unspecified type   Prostate  cancer Maternal Grandfather        dx. late 80s   Breast cancer Cousin        bilateral; dx. late 38s   Breast cancer Other        dx. in their late 60s   Breast cancer Other  dx. 78-79   Colon cancer Neg Hx    Esophageal cancer Neg Hx    Rectal cancer Neg Hx    Stomach cancer Neg Hx     Social History   Socioeconomic History   Marital status: Married    Spouse name: Not on file   Number of children: 1   Years of education: Not on file   Highest education level: Not on file  Occupational History   Occupation: disability    Employer: UNEMPLOYED  Tobacco Use   Smoking status: Never    Passive exposure: Never   Smokeless tobacco: Never  Vaping Use   Vaping Use: Never used  Substance and Sexual Activity   Alcohol use: Yes    Alcohol/week: 2.0 standard drinks of alcohol    Types: 1 Shots of liquor, 1 Standard drinks or equivalent per week    Comment: maybe 1-2 cocktails per wk   Drug use: No   Sexual activity: Yes    Partners: Male    Birth control/protection: Post-menopausal    Comment: vasectomy/first intercourse <17  Other Topics Concern   Not on file  Social History Narrative   Not on file   Social Determinants of Health   Financial Resource Strain: Not on file  Food Insecurity: Not on file  Transportation Needs: Not on file  Physical Activity: Not on file  Stress: Not on file  Social Connections: Not on file  Intimate Partner Violence: Not on file    Review of Systems  All other systems reviewed and are negative.   PHYSICAL EXAMINATION:    BP 136/70 (BP Location: Right Arm, Patient Position: Sitting, Cuff Size: Normal)   Wt 125 lb (56.7 kg)   LMP 09/19/2008   BMI 21.80 kg/m     General appearance: alert, cooperative and appears stated age  EMB: Consent for procedure. Atrophy of the vaginal noted.  Pap and HR HPV collected.  Sterile prep with Hibiclens. Paracervical block with 1% lidocaine 10 cc, lot number    [id number] , expiration  12/2024 Tenaculum to anterior cervical lip. Pipelle passed to    7      cm twice.  Golden brown colored fluid noted.  Tissue to pathology.  Minimal EBL. No complications.   Chaperone was present for exam:  Warren Lacy, CMA  ASSESSMENT  Postmenopausal bleeding.  Cervical cancer screening.  Insufficient pap.  Vaginal atrophy.  FH uterine cancer.   PLAN  Pap and HR HPV collected.  FU EMB. Return for pelvic US and follow up visit.   10  total time was spent for this patient encounter, including preparation, face-to-face counseling with the patient, coordination of care, and documentation of the encounter in addition to doing the endometrial biopsy.

## 2022-10-07 DIAGNOSIS — I129 Hypertensive chronic kidney disease with stage 1 through stage 4 chronic kidney disease, or unspecified chronic kidney disease: Secondary | ICD-10-CM | POA: Diagnosis not present

## 2022-10-07 DIAGNOSIS — E1040 Type 1 diabetes mellitus with diabetic neuropathy, unspecified: Secondary | ICD-10-CM | POA: Diagnosis not present

## 2022-10-07 DIAGNOSIS — I739 Peripheral vascular disease, unspecified: Secondary | ICD-10-CM | POA: Diagnosis not present

## 2022-10-07 DIAGNOSIS — N1831 Chronic kidney disease, stage 3a: Secondary | ICD-10-CM | POA: Diagnosis not present

## 2022-10-11 DIAGNOSIS — E103551 Type 1 diabetes mellitus with stable proliferative diabetic retinopathy, right eye: Secondary | ICD-10-CM | POA: Diagnosis not present

## 2022-10-11 DIAGNOSIS — E103552 Type 1 diabetes mellitus with stable proliferative diabetic retinopathy, left eye: Secondary | ICD-10-CM | POA: Diagnosis not present

## 2022-10-11 DIAGNOSIS — H43812 Vitreous degeneration, left eye: Secondary | ICD-10-CM | POA: Diagnosis not present

## 2022-10-11 DIAGNOSIS — H43811 Vitreous degeneration, right eye: Secondary | ICD-10-CM | POA: Diagnosis not present

## 2022-10-12 ENCOUNTER — Telehealth: Payer: Self-pay | Admitting: Pharmacist

## 2022-10-12 NOTE — Telephone Encounter (Signed)
Clinical questions for Enbrel PA renewal completed on CMM  Chesley Mires, PharmD, MPH, BCPS, CPP Clinical Pharmacist (Rheumatology and Pulmonology)

## 2022-10-12 NOTE — Telephone Encounter (Signed)
Received notification from [facility] regarding a prior authorization for ENBREL. Authorization has been APPROVED from 10/12/22 to 10/12/23. Approval letter sent to scan center.  Patient  should continue to  fill through  [facility] for [employer]  Phone # [phone]  Chesley Mires, PharmD, MPH, BCPS, CPP Clinical Pharmacist (Rheumatology and Pulmonology)

## 2022-10-12 NOTE — Telephone Encounter (Signed)
Submitted a Prior Authorization renewal request to [facility] for ENBREL via CoverMyMeds. Pending clinical questions to populate  Key: [id number]  Chesley Mires, PharmD, MPH, BCPS, CPP Clinical Pharmacist (Rheumatology and Pulmonology)

## 2022-10-15 LAB — CYTOLOGY - PAP
Adequacy: ABNORMAL
Comment: NEGATIVE

## 2022-10-18 ENCOUNTER — Ambulatory Visit: Payer: Medicare Other | Attending: Physician Assistant | Admitting: Physician Assistant

## 2022-10-18 DIAGNOSIS — M170 Bilateral primary osteoarthritis of knee: Secondary | ICD-10-CM

## 2022-10-18 MED ORDER — HYALURONAN 30 MG/2ML IX SOSY
30.0000 mg | PREFILLED_SYRINGE | INTRA_ARTICULAR | Status: AC | PRN
  Administered 2022-10-18: 30 mg via INTRA_ARTICULAR

## 2022-10-18 MED ORDER — LIDOCAINE HCL 1 % IJ SOLN
1.5000 mL | INTRAMUSCULAR | Status: AC | PRN
  Administered 2022-10-18: 1.5 mL

## 2022-10-18 MED ORDER — LIDOCAINE HCL 1 % IJ SOLN
1.5000 mL | INTRAMUSCULAR | Status: AC | PRN
Start: 2022-10-18 — End: 2022-10-18
  Administered 2022-10-18: 1.5 mL

## 2022-10-18 NOTE — Progress Notes (Signed)
   Procedure Note  [name]             Date of Birth: 1956/03/10           MRN: [id number]             Visit Date: 10/18/2022  Procedures: Visit Diagnoses:  1. Primary osteoarthritis of both knees    Orthovisc #1 bilateral knees, B/B Large Joint Inj: bilateral knee on 10/18/2022 11:54 AM Indications: pain Details: 25 G 1.5 in needle, medial approach  Arthrogram: No  Medications (Right): 1.5 mL lidocaine 1 %; 30 mg Hyaluronan 30 MG/2ML Aspirate (Right): 0 mL Medications (Left): 1.5 mL lidocaine 1 %; 30 mg Hyaluronan 30 MG/2ML Aspirate (Left): 0 mL Outcome: tolerated well, no immediate complications Procedure, treatment alternatives, risks and benefits explained, specific risks discussed. Consent was given by the patient. Immediately prior to procedure a time out was called to verify the correct patient, procedure, equipment, support staff and site/side marked as required. Patient was prepped and draped in the usual sterile fashion.     Patient tolerated the procedure well. Aftercare was discussed.  Sherron Ales, PA-C

## 2022-10-20 ENCOUNTER — Other Ambulatory Visit (HOSPITAL_COMMUNITY)
Admission: RE | Admit: 2022-10-20 | Discharge: 2022-10-20 | Disposition: A | Discharge status: Home / Self Care | Payer: Medicare Other | Source: Ambulatory Visit | Attending: Obstetrics and Gynecology | Admitting: Obstetrics and Gynecology

## 2022-10-20 ENCOUNTER — Encounter: Payer: Self-pay | Admitting: Obstetrics and Gynecology

## 2022-10-20 ENCOUNTER — Ambulatory Visit: Payer: Medicare Other | Admitting: Obstetrics and Gynecology

## 2022-10-20 VITALS — BP 136/70 | Wt 125.0 lb

## 2022-10-20 DIAGNOSIS — Z124 Encounter for screening for malignant neoplasm of cervix: Secondary | ICD-10-CM | POA: Diagnosis not present

## 2022-10-20 DIAGNOSIS — R87615 Unsatisfactory cytologic smear of cervix: Secondary | ICD-10-CM | POA: Insufficient documentation

## 2022-10-20 DIAGNOSIS — N950 Postmenopausal bleeding: Secondary | ICD-10-CM | POA: Insufficient documentation

## 2022-10-20 DIAGNOSIS — Z01419 Encounter for gynecological examination (general) (routine) without abnormal findings: Secondary | ICD-10-CM | POA: Insufficient documentation

## 2022-10-20 DIAGNOSIS — Z1151 Encounter for screening for human papillomavirus (HPV): Secondary | ICD-10-CM | POA: Diagnosis not present

## 2022-10-20 NOTE — Patient Instructions (Signed)
Endometrial Biopsy  An endometrial biopsy is a procedure to remove tissue samples from the endometrium, which is the lining of the uterus. The tissue that is removed can then be checked under a microscope for disease. This procedure is used to diagnose conditions such as endometrial cancer, endometrial tuberculosis, polyps, or other inflammatory conditions. This procedure may also be used to investigate uterine bleeding to determine where you are in your menstrual cycle or how your hormone levels are affecting the lining of the uterus. Tell a health care provider about: Any allergies you have. All medicines you are taking, including vitamins, herbs, eye drops, creams, and over-the-counter medicines. Any problems you or family members have had with anesthetic medicines. Any bleeding problems you have. Any surgeries you have had. Any medical conditions you have. Whether you are pregnant or may be pregnant. What are the risks? Your health care provider will talk with you about risks. These may include: Bleeding. Pelvic infection. Puncture of the wall of the uterus with the biopsy device (rare). Allergic reactions to medicines. What happens before the procedure? Keep a record of your menstrual cycles as told by your health care provider. You may need to schedule your procedure for a specific time in your cycle. Bring a sanitary pad in case you need to wear one after the procedure. Ask your health care provider about: Changing or stopping your regular medicines. These include any diabetes medicines or blood thinners you take. Taking medicines such as aspirin and ibuprofen. These medicines can thin your blood. Do not take these medicines unless your health care provider tells you to. Taking over-the-counter medicines, vitamins, herbs, and supplements. Plan to have someone take you home from the hospital or clinic. What happens during the procedure? You will lie on an exam table with your feet  and legs supported as in a pelvic exam. Your health care provider will insert an instrument into your vagina to see your cervix. Your cervix will be cleansed with an antiseptic solution. A medicine (local anesthetic) will be used to numb the cervix. A forceps instrument will be used to hold your cervix steady for the biopsy. A thin, rod-like instrument (uterine sound) will be inserted through your cervix to determine the length of your uterus and the location where the biopsy sample will be removed. A thin, flexible tube (catheter) will be inserted through your cervix and into the uterus. The catheter will be used to collect the biopsy sample from your endometrial tissue. The tube and instruments will be removed, and the tissue sample will be sent to a lab for examination. The procedure may vary among health care providers and hospitals. What happens after the procedure? Your blood pressure, heart rate, breathing rate, and blood oxygen level will be monitored until you leave the hospital or clinic. It is up to you to get the results of your procedure. Ask your health care provider, or the department that is doing the procedure, when your results will be ready. Summary An endometrial biopsy is a procedure to remove tissue samples from the endometrium, which is the lining of the uterus. This procedure is used to diagnose conditions such as endometrial cancer, endometrial tuberculosis, polyps, or other inflammatory conditions. It is up to you to get the results of your procedure. Ask your health care provider, or the department that is doing the procedure, when your results will be ready. This information is not intended to replace advice given to you by your health care provider. Make sure you   discuss any questions you have with your health care provider. Document Revised: 09/22/2021 Document Reviewed: 09/22/2021 Elsevier Patient Education  2023 [employer]  

## 2022-10-22 LAB — SURGICAL PATHOLOGY

## 2022-10-22 LAB — CYTOLOGY - PAP
Comment: NEGATIVE
Diagnosis: NEGATIVE
Diagnosis: REACTIVE
High risk HPV: NEGATIVE

## 2022-10-25 ENCOUNTER — Ambulatory Visit: Payer: Medicare Other | Attending: Physician Assistant | Admitting: Physician Assistant

## 2022-10-25 ENCOUNTER — Ambulatory Visit: Payer: Medicare Other | Admitting: Internal Medicine

## 2022-10-25 VITALS — BP 170/64 | HR 84 | Temp 98.1°F | Resp 18 | Ht 63.6 in | Wt 125.6 lb

## 2022-10-25 DIAGNOSIS — M170 Bilateral primary osteoarthritis of knee: Secondary | ICD-10-CM | POA: Diagnosis not present

## 2022-10-25 DIAGNOSIS — M3501 Sicca syndrome with keratoconjunctivitis: Secondary | ICD-10-CM

## 2022-10-25 DIAGNOSIS — L308 Other specified dermatitis: Secondary | ICD-10-CM

## 2022-10-25 DIAGNOSIS — L810 Postinflammatory hyperpigmentation: Secondary | ICD-10-CM

## 2022-10-25 DIAGNOSIS — Z79899 Other long term (current) drug therapy: Secondary | ICD-10-CM | POA: Diagnosis not present

## 2022-10-25 DIAGNOSIS — L299 Pruritus, unspecified: Secondary | ICD-10-CM | POA: Diagnosis not present

## 2022-10-25 LAB — CBC WITH DIFFERENTIAL/PLATELET
Basophils Absolute: 93 cells/uL (ref 0–200)
Platelets: 245 10*3/uL (ref 140–400)
Total Lymphocyte: 25.2 %

## 2022-10-25 MED ORDER — LIDOCAINE HCL 1 % IJ SOLN
1.5000 mL | INTRAMUSCULAR | Status: AC | PRN
Start: 2022-10-25 — End: 2022-10-25
  Administered 2022-10-25: 1.5 mL

## 2022-10-25 MED ORDER — HYALURONAN 30 MG/2ML IX SOSY
30.0000 mg | PREFILLED_SYRINGE | INTRA_ARTICULAR | Status: AC | PRN
Start: 2022-10-25 — End: 2022-10-25
  Administered 2022-10-25: 30 mg via INTRA_ARTICULAR

## 2022-10-25 NOTE — Patient Instructions (Addendum)
Allergy testing today showed: Negative to environmentals and common foods  Recommend follow up for patch testing:  Patches are best placed on Monday with return to office on Wednesday and Friday of same week for readings.  Patches once placed should not get wet.  You do not have to stop any medications for patch testing but should not be on oral prednisone. You can schedule a patch testing visit when convenient for your schedule.    For rash:  Start topical steroids as prescribed by dermatology, apply twice a day 30 minutes after moisturizer as needed for red, flaky skin    This should not effect your blood sugar Daily Care For Maintenance (daily and continue even once eczema controlled) - Recommend hypoallergenic hydrating ointment at least twice daily.  This must be done daily for control of flares. (Great options include Vaseline, CeraVe, Aquaphor, Aveeno, Cetaphil, VaniCream, etc) - Recommend avoiding detergents, soaps or lotions with fragrances/dyes, and instead using products which are hypoallergenic, use second rinse cycle when washing clothes -Wear lose breathable clothing, avoid wool -Avoid extremes of humidity - Limit showers/baths to 5 minutes and use luke warm water instead of hot, pat dry following baths, and apply moisturizer  For chronic itch:  Start zyrtec (cetirizine) 10mg  daily   Follow up: for patch testing   Thank you so much for letting me partake in your care today.  Don't hesitate to reach out if you have any additional concerns!  Ferol Luz, MD  [facility] [place] [place]   I had the pleasure of seeing Becky [name] for initial evaluation at the [facility] on 10/25/2022. She is a 67 y.o. female, who is referred here by Norberto Sorenson, NP for the evaluation of rash.

## 2022-10-25 NOTE — Progress Notes (Signed)
New Patient Note  RE: Becky [name] MRN: [id number] DOB: February 15, 1956 Date of Office Visit: 10/25/2022  Consult requested by: Adrian Prince, MD Primary care provider: Adrian Prince, MD  Chief Complaint: Rash (After staff infection she got a rash with a terrible itch. Prednisone helped the rash but he itching never went away, head itches constantly. Legs has spots and redness, hematologist thinks it's a drug reaction. )  History of Present Illness: I had the pleasure of seeing Becky [name] for initial evaluation at the [facility] on 10/25/2022. She is a 67 y.o. female, who is referred here by Adrian Prince, MD for the evaluation of rash .   History obtained from patient, chart review .  Started after staph infection in February 2024 on her right arm. Treated with topical and oral antibiotiics which healed infection.  Rash started towards end of antibiotic course. Presented on back, chest  Saw dermatologist who prescribed IM steroid and prednisone and TCS.  Symptoms cleared, but symptoms recurred.  Saw dermatology again who gave 2nd round of IM steroids, prednisone and stronger TCS.  Last OCS in March 2024.  Not taking any antihistamines. .  Now with intermittent patches occurring weekly, but persistent pruritus on torso.   Pictures consistent with small perifollicular erythematous papules. No angioedema  Skin biopsy retured as atopic dermatitis.  No other associated symptoms   Also reports 40 year history of scalp itching sometimes associated with 'whelps".  Seen dermatology who prescribed tar shampoo Denies any history of dandruff.    Also reports developing erythematous papules on legs after prolia injection 1.5 years ago.  Has not had a second injection.  Dermatology initially diagnosed her with purpura, hematology felt like it was more consistent with a drug rash.  Lesions are nontender, pruritic.  They have slowly resolved leaving hyperpigmented flat  macules on her leg.  These appear to be resolving over time as well  Assessment and Plan: [name] is a 67 y.o. female with: Other specified dermatitis - Plan: Allergy Test  Pruritus  Post-inflammatory hyperpigmentation   Plan: Patient Instructions  Allergy testing today showed: Negative to environmentals and common foods  Recommend follow up for patch testing:  Patches are best placed on Monday with return to office on Wednesday and Friday of same week for readings.  Patches once placed should not get wet.  You do not have to stop any medications for patch testing but should not be on oral prednisone. You can schedule a patch testing visit when convenient for your schedule.    For rash:  Start topical steroids as prescribed by dermatology, apply twice a day 30 minutes after moisturizer as needed for red, flaky skin    This should not effect your blood sugar Daily Care For Maintenance (daily and continue even once eczema controlled) - Recommend hypoallergenic hydrating ointment at least twice daily.  This must be done daily for control of flares. (Great options include Vaseline, CeraVe, Aquaphor, Aveeno, Cetaphil, VaniCream, etc) - Recommend avoiding detergents, soaps or lotions with fragrances/dyes, and instead using products which are hypoallergenic, use second rinse cycle when washing clothes -Wear lose breathable clothing, avoid wool -Avoid extremes of humidity - Limit showers/baths to 5 minutes and use luke warm water instead of hot, pat dry following baths, and apply moisturizer  For chronic itch:  Start zyrtec (cetirizine) 10mg  daily   Follow up: for patch testing   Thank you so much for letting me partake in your care today.  Don't hesitate to reach out if you have any additional concerns!  Ferol Luz, MD  [facility] [place] [place]   I had the pleasure of seeing Becky [name] for initial evaluation at the [facility] on 10/25/2022.  She is a 67 y.o. female, who is referred here by Norberto Sorenson, NP for the evaluation of rash.       No orders of the defined types were placed in this encounter.  Lab Orders  No laboratory test(s) ordered today    Other allergy screening: Asthma: no Rhino conjunctivitis: no Food allergy: no Medication allergy:  prolia reaction as above Hymenoptera allergy: no Urticaria: no Eczema:yes History of recurrent infections suggestive of immunodeficency: no  Diagnostics: Skin Testing: Environmental allergy panel and select foods.  adequate controls  Results interpreted by myself and discussed with patient/family.  Airborne Adult Perc - 10/25/22 1048     Time Antigen Placed 1035    Allergen Manufacturer Waynette Buttery    Location Back    Number of Test 59    Panel 1 Select    1. Control-Buffer 50% Glycerol Negative    2. Control-Histamine 1 mg/ml 3+    3. Albumin saline Negative    4. Bahia Negative    5. French Southern Territories Negative    6. Johnson Negative    7. Kentucky Blue Negative    8. Meadow Fescue Negative    9. Perennial Rye Negative    10. Sweet Vernal Negative    11. Timothy Negative    12. Cocklebur Negative    13. Burweed Marshelder Negative    14. Ragweed, short Negative    15. Ragweed, Giant Negative    16. Plantain,  English Negative    17. Lamb's Quarters Negative    18. Sheep Sorrell Negative    19. Rough Pigweed Negative    20. Marsh Elder, Rough Negative    21. Mugwort, Common Negative    22. Ash mix Negative    23. Birch mix Negative    24. Beech American Negative    25. Box, Elder Negative    26. Cedar, red Negative    27. Cottonwood, Guinea-Bissau Negative    28. Elm mix Negative    29. Hickory Negative    30. Maple mix Negative    31. Oak, Guinea-Bissau mix Negative    32. Pecan Pollen Negative    33. Pine mix Negative    34. Sycamore Eastern Negative    35. Walnut, Black Pollen Negative    36. Alternaria alternata Negative    37. Cladosporium Herbarum Negative     38. Aspergillus mix Negative    39. Penicillium mix Negative    40. Bipolaris sorokiniana (Helminthosporium) Negative    41. Drechslera spicifera (Curvularia) Negative    42. Mucor plumbeus Negative    43. Fusarium moniliforme Negative    44. Aureobasidium pullulans (pullulara) Negative    45. Rhizopus oryzae Negative    46. Botrytis cinera Negative    47. Epicoccum nigrum Negative    48. Phoma betae Negative    49. Candida Albicans Negative    50. Trichophyton mentagrophytes Negative    51. Mite, D Farinae  5,000 AU/ml Negative    52. Mite, D Pteronyssinus  5,000 AU/ml Negative    53. Cat Hair 10,000 BAU/ml Negative    54.  Dog Epithelia Negative    55. Mixed Feathers Negative    56. Horse Epithelia Negative    57.  Cockroach, German Negative    58. Mouse Negative    59. Tobacco Leaf Negative             Food Adult Perc - 10/25/22 1000     Time Antigen Placed 1035    Allergen Manufacturer Waynette Buttery    Location Back    Number of allergen test 10    11. Pecan Food Omitted    12. [employer] Omitted    13. Almond Omitted    14. Hazelnut Omitted    15. [place] nut Omitted    16. Coconut Omitted    17. Pistachio Omitted    18. Catfish Omitted    19. Bass Omitted    20. Trout Omitted    21. Tuna Omitted    22. Salmon Omitted    23. Flounder Omitted    24. Codfish Omitted    25. Shrimp Omitted    26. Crab Omitted    27. Lobster Omitted    28. Oyster Omitted    29. Scallops Omitted    30. Barley Omitted    31. Oat  Omitted    32. Rye  Omitted    33. Hops Omitted    34. Rice Omitted    35. Cottonseed Omitted    36. Saccharomyces Cerevisiae  Omitted    37. Pork Omitted    38. [place] Meat Omitted    39. Chicken Meat Omitted    40. Beef Omitted    41. Lamb Omitted    42. Tomato Omitted    43. White Potato Omitted    44. Sweet Potato Omitted    45. Pea, Green/English Omitted    46. Navy Bean Omitted    47. Mushrooms Omitted    48. Avocado Omitted    49. Onion  Omitted    50. Cabbage Omitted    51. Carrots Omitted    52. Celery Omitted    53. Corn Omitted    54. Cucumber Omitted    55. Grape (White seedless) Omitted    56. Orange  Omitted    57. Banana Omitted    58. Apple Omitted    59. Peach Omitted    60. Strawberry Omitted    61. Cantaloupe Omitted    62. Watermelon Omitted    63. Pineapple Omitted    64. Chocolate/Cacao bean Omitted    65. Karaya Gum Omitted    66. Acacia (Arabic Gum) Omitted    67. Cinnamon Omitted    68. Nutmeg Omitted    69. Ginger Omitted    70. Garlic Omitted    71. Pepper, black Omitted    72. 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 determined by examination associated with type 1 diabetes mellitus (HCC) 03/18/2020   Stable treated proliferative diabetic retinopathy of right eye determined by examination associated with type 2 diabetes mellitus (HCC) 03/18/2020   History of vitrectomy 03/18/2020   Complicated UTI (urinary tract infection) 12/13/2018   Failure of outpatient treatment 12/11/2018   Acute metabolic encephalopathy 12/11/2018  Fever 12/10/2018   At high risk for breast cancer 05/09/2018   Eosinophilia 01/31/2017   Iritis 01/04/2017   History of joint swelling 08/24/2016   Osteoarthritis of knees, bilateral 08/24/2016   History of migraine 08/24/2016   History of depression 08/24/2016   History of osteoporosis 08/24/2016   DDD (degenerative disc disease), lumbar 08/24/2016   High risk medication use 08/24/2016   Trigger finger,  left middle finger 08/24/2016   Trigger finger, right ring finger 08/24/2016   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   Increased risk of breast cancer 07/06/2015   IDDM (insulin dependent diabetes mellitus) 07/06/2015   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 without organ or systems involvement (HCC) 07/06/2015   Sjogren's syndrome (HCC) 07/06/2015   Genetic testing 05/12/2015   Family history of breast cancer 04/15/2015   Fibrocystic breast disease 10/11/2012   GERD 10/01/2009   Chest pain with moderate risk for cardiac etiology 09/02/2009   DYSPHAGIA 09/02/2009   ANXIETY 08/15/2009   ULCER-GASTRIC 08/15/2009   CONSTIPATION 08/15/2009   SLOW TRANSIT CONSTIPATION 08/15/2009   FLATULENCE ERUCTATION AND GAS PAIN 08/15/2009   Past Medical History:  Diagnosis Date   Abnormal ECG    Anemia    Anxiety    Cataract    . Cateracts bil eyes removed   GERD (gastroesophageal reflux disease)    Heart murmur    History of COVID-19 06/14/2020   Hypercholesterolemia    IDDM (insulin dependent diabetes mellitus) 1969   Migraine headache    Neuromuscular disorder (HCC)    bil legs & feet - neuropathy   Neuropathy    Osteoporosis    10/2012   Otosclerosis    Pigmented purpura (HCC)    RA (rheumatoid arthritis) (HCC) 2000   S/P cardiac cath 01/2012   Normal coronaries and normal LV function.   Sjoegren syndrome    Past Surgical History: Past Surgical History:  Procedure Laterality Date   back ablation  03/2020   back ablation  10/09/2020   BREAST LUMPECTOMY  1995   CARPAL TUNNEL RELEASE  2000   CATARACT EXTRACTION Right 09-05-12   CATARACT EXTRACTION Left 3/15   CESAREAN SECTION  1987   COLONOSCOPY     EYE SURGERY     diabetic retinopathy    HYSTEROSCOPY  02/2001   and D & C   LYMPH NODE DISSECTION  2002   TOE SURGERY  2000   bone removed   Medication List:  Current Outpatient Medications  Medication Sig Dispense Refill   aspirin 81 MG tablet Take 81 mg  by mouth daily.     calcium carbonate (OS-CAL) 600 MG TABS Take 600 mg by mouth 2 (two) times daily with a meal.      Continuous Blood Gluc Sensor (FREESTYLE LIBRE 2 SENSOR) MISC CHANGE EVERY 14 DAYS     CONTOUR NEXT TEST test strip   6   cyclobenzaprine (FLEXERIL) 10 MG tablet Take 10 mg by mouth 3 (three) times daily as needed for muscle spasms. Reported on 07/03/2015     DULoxetine (CYMBALTA) 30 MG capsule Take 90 mg by mouth daily.     etanercept (ENBREL SURECLICK) 50 MG/ML injection Inject one pen into skin weekly 12 mL 0   famotidine (PEPCID) 20 MG tablet Take 20 mg by mouth 2 (two) times daily.     furosemide (LASIX) 20 MG tablet Take 10 mg by mouth daily.      insulin aspart (NOVOLOG) 100 UNIT/ML injection Insulin pump  losartan (COZAAR) 50 MG tablet Take 50 mg by mouth daily.     polyethylene glycol (MIRALAX / GLYCOLAX) 17 g packet Take 17 g by mouth 2 (two) times daily.      pravastatin (PRAVACHOL) 20 MG tablet Take 20 mg by mouth daily.      SUMAtriptan (IMITREX) 100 MG tablet Take 100 mg by mouth every 2 (two) hours as needed.     topiramate (TOPAMAX) 200 MG tablet Take 200 mg by mouth daily.      vitamin B-12 (CYANOCOBALAMIN) 1000 MCG tablet Take 1,000 mcg by mouth daily.     No current facility-administered medications for this visit.   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   Macrobid [Nitrofurantoin Macrocrystal] Hives   Molnupiravir     hives   Ciprofloxacin Rash   Penicillins Rash   Social History: Social History   Socioeconomic History   Marital status: Married    Spouse name: Not on file   Number of children: 1   Years of education: Not on file   Highest education level: Not on file  Occupational History   Occupation: disability    Employer: UNEMPLOYED  Tobacco Use   Smoking status: Never    Passive exposure: Never    Smokeless tobacco: Never  Vaping Use   Vaping Use: Never used  Substance and Sexual Activity   Alcohol use: Yes    Alcohol/week: 2.0 standard drinks of alcohol    Types: 1 Shots of liquor, 1 Standard drinks or equivalent per week    Comment: maybe 1-2 cocktails per wk   Drug use: No   Sexual activity: Yes    Partners: Male    Birth control/protection: Post-menopausal    Comment: vasectomy/first intercourse <17  Other Topics Concern   Not on file  Social History Narrative   Not on file   Social Determinants of Health   Financial Resource Strain: Not on file  Food Insecurity: Not on file  Transportation Needs: Not on file  Physical Activity: Not on file  Stress: Not on file  Social Connections: Not on file   Lives in a townhome that is 66 years old.  No roaches in the house and bed is not to be off the floor.  Does not precautions on bed and pillows.  Not exposed to fumes, chemicals or dust.  Home is not near an interstate or industrial area. Smoking: No exposure Occupation: Retired  Landscape architect HistorySurveyor, minerals in the house: no Engineer, civil (consulting) in the family room: no Carpet in the bedroom: no Heating: electric Cooling: central Pet: yes dog with access to bedroom   Family History: Family History  Problem Relation Age of Onset   Breast cancer Mother 70   Colon polyps Mother        unspecified number   Uterine cancer Mother        metastatic   Endometrial cancer Mother 50   Stroke Father 9   Rheum arthritis Sister    Breast cancer Sister 48       lump; (-)GT (breast/ova ca panel, GeneDx)   Breast cancer Maternal Grandmother 67   Skin cancer Maternal Grandfather        unspecified type   Prostate cancer Maternal Grandfather        dx. late 54s   Breast cancer Cousin  bilateral; dx. late 44s   Breast cancer Other        dx. in their late 60s   Breast cancer Other        dx. 78-79   Colon cancer Neg Hx    Esophageal cancer Neg Hx    Rectal cancer  Neg Hx    Stomach cancer Neg Hx      ROS: All others negative except as noted per HPI.   Objective: BP (!) 170/64 (BP Location: Left Arm, Patient Position: Sitting, Cuff Size: Normal)   Pulse 84   Temp 98.1 F (36.7 C) (Temporal)   Resp 18   Ht 5' 3.6" (1.615 m)   Wt 125 lb 9.6 oz (57 kg)   LMP 09/19/2008   SpO2 98%   BMI 21.83 kg/m  Body mass index is 21.83 kg/m.  General Appearance:  Alert, cooperative, no distress, appears stated age  Head:  Normocephalic, without obvious abnormality, atraumatic  Eyes:  Conjunctiva clear, EOM's intact  Nose: Nares normal, normal mucosa, no visible anterior polyps, and septum midline  Throat: Lips, tongue normal; teeth and gums normal, normal posterior oropharynx  Neck: Supple, symmetrical  Lungs:   clear to auscultation bilaterally, Respirations unlabored, no coughing  Heart:  regular rate and rhythm and no murmur, Appears well perfused  Extremities: No edema  Skin: Skin color, texture, turgor normal, no rashes or lesions on visualized portions of skin  Neurologic: No gross deficits   The plan was reviewed with the patient/family, and all questions/concerned were addressed.  It was my pleasure to see Shanyah today and participate in her care. Please feel free to contact me with any questions or concerns.  Sincerely,  Ferol Luz, MD Allergy & Immunology  [facility] office: [phone] [facility] office: [phone]

## 2022-10-25 NOTE — Progress Notes (Signed)
   Procedure Note  [name]             Date of Birth: 1956/05/09           MRN: [id number]             Visit Date: 10/25/2022  Procedures: Visit Diagnoses:  1. Primary osteoarthritis of both knees   2. High risk medication use   3. Sjogren's syndrome with keratoconjunctivitis sicca (HCC)    Orthovisc #2 bilateral knees, B/B Large Joint Inj: bilateral knee on 10/25/2022 1:11 PM Indications: pain Details: 25 G 1.5 in needle, medial approach  Arthrogram: No  Medications (Right): 1.5 mL lidocaine 1 %; 30 mg Hyaluronan 30 MG/2ML Aspirate (Right): 0 mL Medications (Left): 1.5 mL lidocaine 1 %; 30 mg Hyaluronan 30 MG/2ML Aspirate (Left): 0 mL Outcome: tolerated well, no immediate complications Procedure, treatment alternatives, risks and benefits explained, specific risks discussed. Consent was given by the patient. Immediately prior to procedure a time out was called to verify the correct patient, procedure, equipment, support staff and site/side marked as required. Patient was prepped and draped in the usual sterile fashion.      Patient tolerated the procedure well.  Aftercare was discussed.  Sherron Ales, PA-C

## 2022-10-26 ENCOUNTER — Ambulatory Visit
Admission: RE | Admit: 2022-10-26 | Discharge: 2022-10-26 | Disposition: A | Discharge status: Home / Self Care | Payer: Medicare Other | Source: Ambulatory Visit | Attending: Obstetrics and Gynecology | Admitting: Obstetrics and Gynecology

## 2022-10-26 DIAGNOSIS — Z1501 Genetic susceptibility to malignant neoplasm of breast: Secondary | ICD-10-CM | POA: Diagnosis not present

## 2022-10-26 DIAGNOSIS — Z9189 Other specified personal risk factors, not elsewhere classified: Secondary | ICD-10-CM

## 2022-10-26 DIAGNOSIS — Z803 Family history of malignant neoplasm of breast: Secondary | ICD-10-CM | POA: Diagnosis not present

## 2022-10-26 LAB — CBC WITH DIFFERENTIAL/PLATELET
Absolute Monocytes: 372 cells/uL (ref 200–950)
Hemoglobin: 14.3 g/dL (ref 11.7–15.5)
MCH: 29.7 pg (ref 27.0–33.0)
Neutro Abs: 3875 cells/uL (ref 1500–7800)
Neutrophils Relative %: 62.5 %
RDW: 13.1 % (ref 11.0–15.0)
WBC: 6.2 10*3/uL (ref 3.8–10.8)

## 2022-10-26 LAB — COMPLETE METABOLIC PANEL WITH GFR
Albumin: 4 g/dL (ref 3.6–5.1)
Alkaline phosphatase (APISO): 66 U/L (ref 37–153)
CO2: 29 mmol/L (ref 20–32)
Potassium: 4.3 mmol/L (ref 3.5–5.3)
Total Protein: 7 g/dL (ref 6.1–8.1)

## 2022-10-26 LAB — URINALYSIS, ROUTINE W REFLEX MICROSCOPIC
Hgb urine dipstick: NEGATIVE
Leukocytes,Ua: NEGATIVE
pH: 7.5 (ref 5.0–8.0)

## 2022-10-26 MED ORDER — GADOPICLENOL 0.5 MMOL/ML IV SOLN
6.0000 mL | Freq: Once | INTRAVENOUS | Status: AC | PRN
  Administered 2022-10-26: 6 mL via INTRAVENOUS

## 2022-10-26 NOTE — Progress Notes (Signed)
CBC stable.  Glucose is 193. Rest of CMP WNL.  ESR WNL UA normal.

## 2022-10-26 NOTE — Progress Notes (Signed)
C3 remains borderline low but stable. C4 WNL.

## 2022-10-26 NOTE — Progress Notes (Deleted)
GYNECOLOGY  VISIT   HPI: 67 y.o.   Married  Caucasian  female   G1P1001 with Patient's last menstrual period was 09/19/2008.   here for   U/S consult  GYNECOLOGIC HISTORY: Patient's last menstrual period was 09/19/2008. Contraception:  PMP Menopausal hormone therapy:  n/a Last mammogram:  04/12/22 Breast Density Cat D, BI-RADS CAT 2 benign  Last pap smear:   12/18/20 neg, 09/21/17 neg: HR HPV neg         OB History     Gravida  1   Para  1   Term  1   Preterm  0   AB  0   Living  1      SAB  0   IAB  0   Ectopic  0   Multiple  0   Live Births  1              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 determined by examination associated with type 1 diabetes mellitus (HCC) 03/18/2020   Stable treated proliferative diabetic retinopathy of right eye determined by examination associated with type 2 diabetes mellitus (HCC) 03/18/2020   History of vitrectomy 03/18/2020   Complicated UTI (urinary tract infection) 12/13/2018   Failure of outpatient treatment 12/11/2018   Acute metabolic encephalopathy 12/11/2018   Fever 12/10/2018   At high risk for breast cancer 05/09/2018   Eosinophilia 01/31/2017   Iritis 01/04/2017   History of joint swelling 08/24/2016   Osteoarthritis of knees, bilateral 08/24/2016   History of migraine 08/24/2016   History of depression 08/24/2016   History of osteoporosis 08/24/2016   DDD (degenerative disc disease), lumbar 08/24/2016   High risk medication use 08/24/2016   Trigger finger, left middle finger 08/24/2016   Trigger finger,  right ring finger 08/24/2016   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   Increased risk of breast cancer 07/06/2015   IDDM (insulin dependent diabetes mellitus) 07/06/2015   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 without organ or systems involvement (HCC) 07/06/2015   Sjogren's syndrome (HCC) 07/06/2015   Genetic testing 05/12/2015   Family history of breast cancer 04/15/2015   Fibrocystic breast disease 10/11/2012   GERD 10/01/2009   Chest pain with moderate risk for cardiac etiology 09/02/2009   DYSPHAGIA 09/02/2009   ANXIETY 08/15/2009   ULCER-GASTRIC 08/15/2009   CONSTIPATION 08/15/2009   SLOW TRANSIT CONSTIPATION 08/15/2009   FLATULENCE ERUCTATION AND GAS PAIN 08/15/2009    Past Medical History:  Diagnosis Date   Abnormal ECG    Anemia    Anxiety    Cataract    . Cateracts bil eyes removed   GERD (gastroesophageal reflux disease)    Heart murmur    History of COVID-19 06/14/2020   Hypercholesterolemia    IDDM (insulin dependent diabetes mellitus) 1969   Migraine headache    Neuromuscular disorder (HCC)    bil legs & feet - neuropathy   Neuropathy    Osteoporosis    10/2012   Otosclerosis    Pigmented purpura (HCC)    RA (rheumatoid arthritis) (HCC) 2000   S/P cardiac cath 01/2012  Normal coronaries and normal LV function.   Sjoegren syndrome     Past Surgical History:  Procedure Laterality Date   back ablation  03/2020   back ablation  10/09/2020   BREAST LUMPECTOMY  1995   CARPAL TUNNEL RELEASE  2000   CATARACT EXTRACTION Right 09-05-12   CATARACT EXTRACTION Left 3/15   CESAREAN SECTION  1987   COLONOSCOPY     EYE SURGERY     diabetic retinopathy    HYSTEROSCOPY  02/2001   and D & C   LYMPH NODE DISSECTION  2002   TOE SURGERY  2000   bone removed    Current Outpatient Medications  Medication Sig Dispense Refill   aspirin 81 MG tablet Take 81 mg by mouth daily.     calcium carbonate (OS-CAL) 600 MG TABS Take 600 mg by mouth 2  (two) times daily with a meal.      Continuous Blood Gluc Sensor (FREESTYLE LIBRE 2 SENSOR) MISC CHANGE EVERY 14 DAYS     CONTOUR NEXT TEST test strip   6   cyclobenzaprine (FLEXERIL) 10 MG tablet Take 10 mg by mouth 3 (three) times daily as needed for muscle spasms. Reported on 07/03/2015     DULoxetine (CYMBALTA) 30 MG capsule Take 90 mg by mouth daily.     etanercept (ENBREL SURECLICK) 50 MG/ML injection Inject one pen into skin weekly 12 mL 0   famotidine (PEPCID) 20 MG tablet Take 20 mg by mouth 2 (two) times daily.     furosemide (LASIX) 20 MG tablet Take 10 mg by mouth daily.      insulin aspart (NOVOLOG) 100 UNIT/ML injection Insulin pump     losartan (COZAAR) 50 MG tablet Take 50 mg by mouth daily.     polyethylene glycol (MIRALAX / GLYCOLAX) 17 g packet Take 17 g by mouth 2 (two) times daily.      pravastatin (PRAVACHOL) 20 MG tablet Take 20 mg by mouth daily.      SUMAtriptan (IMITREX) 100 MG tablet Take 100 mg by mouth every 2 (two) hours as needed.     topiramate (TOPAMAX) 200 MG tablet Take 200 mg by mouth daily.      vitamin B-12 (CYANOCOBALAMIN) 1000 MCG tablet Take 1,000 mcg by mouth daily.     No current facility-administered medications for this visit.     ALLERGIES: Chlorhexidine, Denosumab, Macrobid [nitrofurantoin macrocrystal], Molnupiravir, Ciprofloxacin, and Penicillins  Family History  Problem Relation Age of Onset   Breast cancer Mother 68   Colon polyps Mother        unspecified number   Uterine cancer Mother        metastatic   Endometrial cancer Mother 56   Stroke Father 40   Rheum arthritis Sister    Breast cancer Sister 48       lump; (-)GT (breast/ova ca panel, [facility])   Breast cancer Maternal Grandmother 22   Skin cancer Maternal Grandfather        unspecified type   Prostate cancer Maternal Grandfather        dx. late 86s   Breast cancer Cousin        bilateral; dx. late 30s   Breast cancer Other        dx. in their late 60s   Breast cancer  Other        dx. 78-79   Colon cancer Neg Hx    Esophageal cancer Neg Hx    Rectal cancer Neg Hx  Stomach cancer Neg Hx     Social History   Socioeconomic History   Marital status: Married    Spouse name: Not on file   Number of children: 1   Years of education: Not on file   Highest education level: Not on file  Occupational History   Occupation: disability    Employer: UNEMPLOYED  Tobacco Use   Smoking status: Never    Passive exposure: Never   Smokeless tobacco: Never  Vaping Use   Vaping Use: Never used  Substance and Sexual Activity   Alcohol use: Yes    Alcohol/week: 2.0 standard drinks of alcohol    Types: 1 Shots of liquor, 1 Standard drinks or equivalent per week    Comment: maybe 1-2 cocktails per wk   Drug use: No   Sexual activity: Yes    Partners: Male    Birth control/protection: Post-menopausal    Comment: vasectomy/first intercourse <17  Other Topics Concern   Not on file  Social History Narrative   Not on file   Social Determinants of Health   Financial Resource Strain: Not on file  Food Insecurity: Not on file  Transportation Needs: Not on file  Physical Activity: Not on file  Stress: Not on file  Social Connections: Not on file  Intimate Partner Violence: Not on file    Review of Systems  PHYSICAL EXAMINATION:    LMP 09/19/2008     General appearance: alert, cooperative and appears stated age Head: Normocephalic, without obvious abnormality, atraumatic Neck: no adenopathy, supple, symmetrical, trachea midline and thyroid normal to inspection and palpation Lungs: clear to auscultation bilaterally Breasts: normal appearance, no masses or tenderness, No nipple retraction or dimpling, No nipple discharge or bleeding, No axillary or supraclavicular adenopathy Heart: regular rate and rhythm Abdomen: soft, non-tender, no masses,  no organomegaly Extremities: extremities normal, atraumatic, no cyanosis or edema Skin: Skin color, texture,  turgor normal. No rashes or lesions Lymph nodes: Cervical, supraclavicular, and axillary nodes normal. No abnormal inguinal nodes palpated Neurologic: Grossly normal  Pelvic: External genitalia:  no lesions              Urethra:  normal appearing urethra with no masses, tenderness or lesions              Bartholins and Skenes: normal                 Vagina: normal appearing vagina with normal color and discharge, no lesions              Cervix: no lesions                Bimanual Exam:  Uterus:  normal size, contour, position, consistency, mobility, non-tender              Adnexa: no mass, fullness, tenderness              Rectal exam: {yes no:314532}.  Confirms.              Anus:  normal sphincter tone, no lesions  Chaperone was present for exam:  ***  ASSESSMENT     PLAN     An After Visit Summary was printed and given to the patient.  ______ minutes face to face time of which over 50% was spent in counseling.

## 2022-10-28 LAB — COMPLETE METABOLIC PANEL WITH GFR
Calcium: 9.7 mg/dL (ref 8.6–10.4)
Chloride: 105 mmol/L (ref 98–110)
Creat: 0.93 mg/dL (ref 0.50–1.05)
Total Bilirubin: 0.5 mg/dL (ref 0.2–1.2)

## 2022-10-28 LAB — CBC WITH DIFFERENTIAL/PLATELET
Basophils Relative: 1.5 %
MCHC: 32.6 g/dL (ref 32.0–36.0)
Monocytes Relative: 6 %

## 2022-10-28 LAB — PROTEIN ELECTROPHORESIS, SERUM, WITH REFLEX: Beta 2: 0.4 g/dL (ref 0.2–0.5)

## 2022-11-01 ENCOUNTER — Ambulatory Visit: Payer: Medicare Other | Attending: Physician Assistant | Admitting: Physician Assistant

## 2022-11-01 DIAGNOSIS — M170 Bilateral primary osteoarthritis of knee: Secondary | ICD-10-CM

## 2022-11-01 MED ORDER — LIDOCAINE HCL 1 % IJ SOLN
1.5000 mL | INTRAMUSCULAR | Status: AC | PRN
Start: 2022-11-01 — End: 2022-11-01
  Administered 2022-11-01: 1.5 mL

## 2022-11-01 MED ORDER — HYALURONAN 30 MG/2ML IX SOSY
30.0000 mg | PREFILLED_SYRINGE | INTRA_ARTICULAR | Status: AC | PRN
Start: 2022-11-01 — End: 2022-11-01
  Administered 2022-11-01: 30 mg via INTRA_ARTICULAR

## 2022-11-01 NOTE — Progress Notes (Signed)
   Procedure Note  [name]             Date of Birth: [birth year]           MRN: [id number]             Visit Date: 11/01/2022  Procedures: Visit Diagnoses:  1. Primary osteoarthritis of both knees    Orthovisc #3 bilateral knees, B/B Large Joint Inj: bilateral knee on 11/01/2022 1:25 PM Indications: pain Details: 25 G 1.5 in needle, medial approach  Arthrogram: No  Medications (Right): 1.5 mL lidocaine 1 %; 30 mg Hyaluronan 30 MG/2ML Aspirate (Right): 0 mL Medications (Left): 1.5 mL lidocaine 1 %; 30 mg Hyaluronan 30 MG/2ML Aspirate (Left): 0 mL Outcome: tolerated well, no immediate complications Procedure, treatment alternatives, risks and benefits explained, specific risks discussed. Consent was given by the patient. Immediately prior to procedure a time out was called to verify the correct patient, procedure, equipment, support staff and site/side marked as required. Patient was prepped and draped in the usual sterile fashion.     Patient tolerated the procedure well.  Aftercare was discussed.  Sherron Ales, PA-C

## 2022-11-02 LAB — CBC WITH DIFFERENTIAL/PLATELET
Eosinophils Absolute: 298 cells/uL (ref 15–500)
Eosinophils Relative: 4.8 %
HCT: 43.9 % (ref 35.0–45.0)
Lymphs Abs: 1562 cells/uL (ref 850–3900)
MCV: 91.3 fL (ref 80.0–100.0)
MPV: 13.4 fL — ABNORMAL HIGH (ref 7.5–12.5)
RBC: 4.81 10*6/uL (ref 3.80–5.10)

## 2022-11-02 LAB — URINALYSIS, ROUTINE W REFLEX MICROSCOPIC
Bilirubin Urine: NEGATIVE
Glucose, UA: NEGATIVE
Ketones, ur: NEGATIVE
Nitrite: NEGATIVE
Protein, ur: NEGATIVE
Specific Gravity, Urine: 1.013 (ref 1.001–1.035)

## 2022-11-02 LAB — PROTEIN ELECTROPHORESIS, SERUM, WITH REFLEX
Albumin ELP: 4 g/dL (ref 3.8–4.8)
Alpha 1: 0.3 g/dL (ref 0.2–0.3)
Alpha 2: 0.8 g/dL (ref 0.5–0.9)
Beta Globulin: 0.5 g/dL (ref 0.4–0.6)
Gamma Globulin: 1 g/dL (ref 0.8–1.7)
Total Protein: 7 g/dL (ref 6.1–8.1)

## 2022-11-02 LAB — COMPLETE METABOLIC PANEL WITH GFR
AG Ratio: 1.3 (calc) (ref 1.0–2.5)
ALT: 22 U/L (ref 6–29)
AST: 28 U/L (ref 10–35)
BUN: 15 mg/dL (ref 7–25)
Globulin: 3 g/dL (calc) (ref 1.9–3.7)
Glucose, Bld: 193 mg/dL — ABNORMAL HIGH (ref 65–99)
Sodium: 141 mmol/L (ref 135–146)
eGFR: 68 mL/min/{1.73_m2} (ref >=60)

## 2022-11-02 LAB — C3 AND C4
C3 Complement: 82 mg/dL — ABNORMAL LOW (ref 83–193)
C4 Complement: 19 mg/dL (ref 15–57)

## 2022-11-02 LAB — SEDIMENTATION RATE: Sed Rate: 9 mm/h (ref 0–30)

## 2022-11-02 LAB — IFE INTERPRETATION

## 2022-11-02 NOTE — Progress Notes (Signed)
IFE normal pattern-no monoclonal proteins noted

## 2022-11-04 ENCOUNTER — Ambulatory Visit: Payer: Medicare Other | Admitting: Internal Medicine

## 2022-11-05 NOTE — Progress Notes (Unsigned)
FOLLOW UP Date of Service/Encounter:  11/05/22   Subjective:  Becky [name] (DOB: 1955/12/01) is a 67 y.o. female who [facility] in re-evaluation of the following: *** History obtained from: chart review and {Persons; PED relatives w/patient:19415::"patient"}.  For Review, LV was on 10/25/22  with Dr. Marlynn Perking seen for intial visit for rash . See below for summary of history and diagnostics.  Therapeutic plans/changes recommended: Plan for patch testing, continue topical steroids by dermatology  Pertinent History/Diagnostics:  Rash: Started after staph infection in February 2024 on her right arm. Treated with topical and oral antibiotiics which healed infection.  Rash started towards end of antibiotic course. Presented on back, chest  Saw dermatologist who prescribed IM steroid and prednisone and TCS.  Symptoms cleared, but symptoms recurred.  Saw dermatology again who gave 2nd round of IM steroids, prednisone and stronger TCS.  Last OCS in March 2024.  Not taking any antihistamines. .  Now with intermittent patches occurring weekly, but persistent pruritus on torso.   Pictures consistent with small perifollicular erythematous papules. No angioedema  Skin biopsy retured as atopic dermatitis.  No other associated symptoms  - SPT 10/25/22: Negative to environmentals and common foods  Drug rash vs purpura:  Additionally reports developing erythematous papules on legs after prolia injection 1.5 years ago. Has not had a second injection. Dermatology initially diagnosed her with purpura, hematology felt like it was more consistent with a drug rash. Lesions are nontender, pruritic. They have slowly resolved leaving hyperpigmented flat macules on her leg.  {Blank single:19197::"H/o Penicillin Allergy","***"}: ***   Today presents for follow-up. 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 If you have any questions, ask your nurse or doctor.          aspirin 81 MG tablet Take 81 mg by mouth daily.   calcium carbonate 600 MG Tabs tablet Commonly known as: OS-CAL Take 600 mg by mouth 2 (two) times daily with a meal.   Contour Next Test test strip Generic drug: glucose blood   cyanocobalamin 1000 MCG tablet Commonly known as: VITAMIN B12 Take 1,000 mcg by mouth daily.   cyclobenzaprine 10 MG tablet Commonly known as: FLEXERIL Take 10 mg by mouth 3 (three) times daily as needed for muscle spasms. Reported on 07/03/2015   DULoxetine 30 MG capsule Commonly known as: CYMBALTA Take 90 mg by mouth daily.   Enbrel SureClick 50 MG/ML injection Generic drug: etanercept Inject one pen into skin weekly   famotidine 20 MG tablet Commonly known as: PEPCID Take 20 mg by mouth 2 (two) times daily.   FreeStyle Libre 2 Sensor Misc CHANGE EVERY 14 DAYS   furosemide 20 MG tablet Commonly known as: LASIX Take 10 mg by mouth daily.   insulin aspart 100 UNIT/ML injection Commonly known as: novoLOG Insulin pump   losartan 50 MG tablet Commonly known as: COZAAR Take 50 mg by mouth daily.   polyethylene glycol 17 g packet Commonly known as: MIRALAX / GLYCOLAX Take 17 g by mouth 2 (two) times  daily.   pravastatin 20 MG tablet Commonly known as: PRAVACHOL Take 20 mg by mouth daily.   SUMAtriptan 100 MG tablet Commonly known as: IMITREX Take 100 mg by mouth every 2 (two) hours as needed.   topiramate 200 MG tablet Commonly known as: TOPAMAX Take 200 mg by mouth daily.       Past Medical History:  Diagnosis Date   Abnormal ECG    Anemia    Anxiety    Cataract    . Cateracts bil eyes  removed   GERD (gastroesophageal reflux disease)    Heart murmur    History of COVID-19 06/14/2020   Hypercholesterolemia    IDDM (insulin dependent diabetes mellitus) 1969   Migraine headache    Neuromuscular disorder (HCC)    bil legs & feet - neuropathy   Neuropathy    Osteoporosis    10/2012   Otosclerosis    Pigmented purpura (HCC)    RA (rheumatoid arthritis) (HCC) 2000   S/P cardiac cath 01/2012   Normal coronaries and normal LV function.   Sjoegren syndrome    Past Surgical History:  Procedure Laterality Date   back ablation  03/2020   back ablation  10/09/2020   BREAST LUMPECTOMY  1995   CARPAL TUNNEL RELEASE  2000   CATARACT EXTRACTION Right 09-05-12   CATARACT EXTRACTION Left 3/15   CESAREAN SECTION  1987   COLONOSCOPY     EYE SURGERY     diabetic retinopathy    HYSTEROSCOPY  02/2001   and D & C   LYMPH NODE DISSECTION  2002   TOE SURGERY  2000   bone removed   Otherwise, there have been no changes to her past medical history, surgical history, family history, or social history.  ROS: All others negative except as noted per HPI.   Objective:  LMP 09/19/2008  There is no height or weight on file to calculate BMI. Physical Exam: General Appearance:  Alert, cooperative, no distress, appears stated age  Head:  Normocephalic, without obvious abnormality, atraumatic  Eyes:  Conjunctiva clear, EOM's intact  Nose: Nares normal, {Blank multiple:19196:a:"***","hypertrophic turbinates","normal mucosa","no visible anterior polyps","septum midline"}  Throat: Lips, tongue normal; teeth and gums normal, {Blank multiple:19196:a:"***","normal posterior oropharynx","tonsils 2+","tonsils 3+","no tonsillar exudate","+ cobblestoning","surgically absent tonsils"}  Neck: Supple, symmetrical  Lungs:   {Blank multiple:19196:a:"***","clear to auscultation bilaterally","end-expiratory wheezing","wheezing throughout"}, Respirations unlabored, {Blank multiple:19196:a:"***","no  coughing","intermittent dry coughing"}  Heart:  {Blank multiple:19196:a:"***","regular rate and rhythm","no murmur"}, Appears well perfused  Extremities: No edema  Skin: {Blank multiple:19196:a:"***","Skin color, texture, turgor normal","no rashes or lesions on visualized portions of skin"}  Neurologic: No gross deficits   Reviewed: ***  Spirometry:  Tracings reviewed. Her effort: {Blank single:19197::"Good reproducible efforts.","It was hard to get consistent efforts and there is a question as to whether this reflects a maximal maneuver.","Poor effort, data can not be interpreted.","Variable effort-results affected.","decent for first attempt at spirometry."} FVC: ***L FEV1: ***L, ***% predicted FEV1/FVC ratio: ***% Interpretation: {Blank single:19197::"Spirometry consistent with mild obstructive disease","Spirometry consistent with moderate obstructive disease","Spirometry consistent with severe obstructive disease","Spirometry consistent with possible restrictive disease","Spirometry consistent with mixed obstructive and restrictive disease","Spirometry uninterpretable due to technique","Spirometry consistent with normal pattern","No overt abnormalities noted given today's efforts"}.  Please see scanned spirometry results for details.  Skin Testing: {Blank single:19197::"Select foods","Environmental allergy panel","Environmental allergy panel and select foods","Food allergy panel","None","Deferred due to recent antihistamines use","deferred due to recent reaction"}. ***Adequate positive and negative controls Results discussed with patient/family.   {Blank single:19197::"Allergy testing results were read and  interpreted by myself, documented by clinical staff."," "}  Assessment/Plan   ***  Tonny Bollman, MD  [facility]

## 2022-11-08 ENCOUNTER — Encounter: Payer: Self-pay | Admitting: Internal Medicine

## 2022-11-08 ENCOUNTER — Ambulatory Visit: Payer: Medicare Other | Admitting: Internal Medicine

## 2022-11-08 DIAGNOSIS — L253 Unspecified contact dermatitis due to other chemical products: Secondary | ICD-10-CM

## 2022-11-08 NOTE — Addendum Note (Signed)
Addended by: Lynnae Sandhoff, Eyana Stolze E on: 11/08/2022 10:28 AM   Modules accepted: Orders

## 2022-11-09 ENCOUNTER — Other Ambulatory Visit: Payer: Medicare Other | Admitting: Obstetrics and Gynecology

## 2022-11-09 ENCOUNTER — Other Ambulatory Visit: Payer: Medicare Other

## 2022-11-10 ENCOUNTER — Ambulatory Visit (INDEPENDENT_AMBULATORY_CARE_PROVIDER_SITE_OTHER): Payer: Medicare Other | Admitting: Internal Medicine

## 2022-11-10 DIAGNOSIS — L253 Unspecified contact dermatitis due to other chemical products: Secondary | ICD-10-CM | POA: Diagnosis not present

## 2022-11-10 NOTE — Progress Notes (Signed)
   Follow Up Note  RE: Becky [name] MRN: [id number] DOB: Dec 23, 1955 Date of Office Visit: 11/10/2022  Referring provider: Adrian Prince, MD Primary care provider: Adrian Prince, MD  History of Present Illness: I had the pleasure of seeing Becky [name] for a follow up visit at the [facility] on 11/10/2022. She is a 67 y.o. female, who is being followed for dermatitis . Today she is here for initial patch test interpretation, given suspected history of contact dermatitis.   Diagnostics:  TRUE TEST 48 hour reading: negative   T.R.U.E. Test - 11/10/22 1058       Test Information   Time Antigen Placed 1020    Lot # [id number]    Location Back    Number of Test 35    Reading Interval Day 1    Panel Panel 1;Panel 2;Panel 3      Panel 1   1. Nickel Sulfate 0    2. Wool Alcohols 0    3. Neomycin Sulfate 0    4. Potassium Dichromate 0    5. Caine Mix 0    6. Fragrance Mix 0    7. Colophony 0    8. Paraben Mix 0    9. Negative Control 0    10. Balsam of [place] 0    11. Ethylenediamine Dihydrochloride 0    12. Cobalt Dichloride 0      Panel 2   13. p-tert Butylphenol Formaldehyde Resin 0    14. Epoxy Resin 0    15. Carba Mix 0    16.  Black Rubber Mix 0    17. Cl+ Me-Isothiazolinone 0    18. Quaternium-15 0    19. Methyldibromo Glutaronitrile 0    20. p-Phenylenediamine 0    21. Formaldehyde 0    22. Mercapto Mix 0    23. Thimerosal 0    24. Thiuram Mix 0      Panel 3   25. Diazolidinyl Urea 0    26. Quinoline Mix 0    27. Tixocortol-21-Pivalate 0    28. Gold Sodium Thiosulfate 0    29. Imidazolidinyl Urea 0    30. Budesonide 0    31. Hydrocortisone-17-Butyrate 0    32. Mercaptobenzothiazole 0    33. Bacitracin 0    34. Parthenolide 0    35. Disperse Blue 106 0    36. 2-Bromo-2-Nitropropane-1,3-diol 0              Assessment and Plan: Becky [name] is a 67 y.o. female with: Concern for Contact Dermatitis:  The patient has been  provided detailed information regarding the substances she is sensitive to, as well as products containing the substances.  Meticulous avoidance of these substances is recommended. If avoidance is not possible, the use of barrier creams or lotions is recommended. If symptoms persist or progress despite meticulous avoidance of chemicals/substances above, dermatology evaluation may be warranted. No follow-ups on file.  It was my pleasure to see Becky [name] today and participate in her care. Please feel free to contact me with any questions or concerns.  Sincerely,   Ferol Luz, MD [facility]

## 2022-11-12 ENCOUNTER — Ambulatory Visit: Payer: Medicare Other | Admitting: Family

## 2022-11-12 ENCOUNTER — Encounter: Payer: Self-pay | Admitting: Family

## 2022-11-12 DIAGNOSIS — L299 Pruritus, unspecified: Secondary | ICD-10-CM

## 2022-11-12 DIAGNOSIS — L308 Other specified dermatitis: Secondary | ICD-10-CM

## 2022-11-12 NOTE — Addendum Note (Signed)
Addended by: Berna Bue on: 11/12/2022 12:28 PM   Modules accepted: Orders

## 2022-11-12 NOTE — Progress Notes (Signed)
Becky [name] returns to the office today for the final patch test interpretation, given suspected history of contact dermatitis.   She reports that the rash started overall after having a staph infection on her left arm.  She was given a steroid shot and this helped her.  Once this was done it flared up again.  She was given "a bigger steroid shot" and steroid cream and this helped.  She reports that her biopsy came back showing atopic dermatitis.  She does not have a rash today, but mentions that she does have itching.  When the rash is there it is only on her chest ,back and sometimes on her face.  It is never on her arms and legs.  He has been better for the past couple weeks since using cooler water when she showers and using a moisturizer.  She reports that she does have pigmented purpura on her legs. She sees Dr. Naoma Diener with [facility].  Diagnostics:   TRUE TEST 96-hour hour reading: all negative   Plan:   Allergic contact dermatitis -True Test 96 hour reading all negative - Continue creams as per dermatology for atopic dermatitis. - Can consider Dupixent injections if atopic dermatis starts to flare and is not controlled with the creams prescribed by dermatology  Follow up in 2 months or sooner if needed  Becky Settle, FNP [facility]

## 2022-11-16 DIAGNOSIS — G518 Other disorders of facial nerve: Secondary | ICD-10-CM | POA: Diagnosis not present

## 2022-11-16 DIAGNOSIS — M791 Myalgia, unspecified site: Secondary | ICD-10-CM | POA: Diagnosis not present

## 2022-11-16 DIAGNOSIS — M542 Cervicalgia: Secondary | ICD-10-CM | POA: Diagnosis not present

## 2022-11-16 DIAGNOSIS — G43719 Chronic migraine without aura, intractable, without status migrainosus: Secondary | ICD-10-CM | POA: Diagnosis not present

## 2022-12-02 NOTE — Progress Notes (Unsigned)
Office Visit Note  [name]             Date of Birth: 1956/06/08           MRN: [id number]             PCP: Adrian Prince, MD Referring: Adrian Prince, MD Visit Date: 12/16/2022 Occupation: @GUAROCC @  Subjective:  Medication monitoring   History of Present Illness: Becky [name] is a 67 y.o. female with history of sjogren's syndrome and seropositive rheumatoid arthritis.  Patient remains on Enbrel 50 mg sq injections once every 10 day.  Patient reports that she tried spacing Enbrel to every 14 days but developed increased pain and inflammation in both hands.  She states that since resuming injections every 10 days her rheumatoid arthritis has been well-controlled.  She denies any joint inflammation at this time.  She states that she is not experiencing any knee joint pain since undergoing Visco gel injections for both knees in May 2024.  She states she continues to have neck pain and stiffness.  She is having some trapezius muscle tension and tenderness bilaterally.  She experiences tension headaches secondary to the muscle tension.  She requested a referral to physical therapy. Patient states that her sicca symptoms remain unchanged.  She has been trying to drink fluids throughout the day and using eyedrops for dry eyes.   Activities of Daily Living:  Patient reports morning stiffness for 0 minute.   Patient Denies nocturnal pain.  Difficulty dressing/grooming: Denies Difficulty climbing stairs: Reports Difficulty getting out of chair: Denies Difficulty using hands for taps, buttons, cutlery, and/or writing: Reports  Review of Systems  Constitutional:  Positive for fatigue.  HENT:  Positive for mouth dryness. Negative for mouth sores.   Eyes:  Positive for dryness.  Respiratory:  Negative for shortness of breath.   Cardiovascular:  Negative for chest pain and palpitations.  Gastrointestinal:  Negative for blood in stool, constipation and diarrhea.   Endocrine: Negative for increased urination.  Genitourinary:  Negative for involuntary urination.  Musculoskeletal:  Positive for myalgias, muscle tenderness and myalgias. Negative for joint pain, gait problem, joint pain, joint swelling, muscle weakness and morning stiffness.  Skin:  Positive for rash. Negative for color change, hair loss and sensitivity to sunlight.  Allergic/Immunologic: Positive for susceptible to infections.  Neurological:  Positive for headaches. Negative for dizziness.  Hematological:  Negative for swollen glands.  Psychiatric/Behavioral:  Positive for depressed mood and sleep disturbance. The patient is nervous/anxious.     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 determined by examination associated with type 1 diabetes mellitus (HCC) 03/18/2020   Stable treated proliferative diabetic retinopathy of right eye determined by examination associated with type 2 diabetes mellitus (HCC) 03/18/2020   History of vitrectomy 03/18/2020   Complicated UTI (urinary tract infection) 12/13/2018   Failure of outpatient treatment 12/11/2018   Acute metabolic encephalopathy 12/11/2018   Fever 12/10/2018   At high risk for breast cancer 05/09/2018   Eosinophilia 01/31/2017   Iritis 01/04/2017   History of joint swelling 08/24/2016   Osteoarthritis of knees,  bilateral 08/24/2016   History of migraine 08/24/2016   History of depression 08/24/2016   History of osteoporosis 08/24/2016   DDD (degenerative disc disease), lumbar 08/24/2016   High risk medication use  08/24/2016   Trigger finger, left middle finger 08/24/2016   Trigger finger, right ring finger 08/24/2016   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   Increased risk of breast cancer 07/06/2015   IDDM (insulin dependent diabetes mellitus) 07/06/2015   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 without organ or systems involvement (HCC) 07/06/2015   Sjogren's syndrome (HCC) 07/06/2015   Genetic testing 05/12/2015   Family history of breast cancer 04/15/2015   Fibrocystic breast disease 10/11/2012   GERD 10/01/2009   Chest pain with moderate risk for cardiac etiology 09/02/2009   DYSPHAGIA 09/02/2009   ANXIETY 08/15/2009   ULCER-GASTRIC 08/15/2009   CONSTIPATION 08/15/2009   SLOW TRANSIT CONSTIPATION 08/15/2009   FLATULENCE ERUCTATION AND GAS PAIN 08/15/2009    Past Medical History:  Diagnosis Date   Abnormal ECG    Anemia    Anxiety    Cataract    . Cateracts bil eyes removed   Eczema    GERD (gastroesophageal reflux disease)    Heart murmur    History of COVID-19 06/14/2020   Hypercholesterolemia    IDDM (insulin dependent diabetes mellitus) 1969   Migraine headache    Neuromuscular disorder (HCC)    bil legs & feet - neuropathy   Neuropathy    Osteoporosis    10/2012   Otosclerosis    Pigmented purpura (HCC)    RA (rheumatoid arthritis) (HCC) 2000   S/P cardiac cath 01/2012   Normal coronaries and normal LV function.   Sjoegren syndrome     Family History  Problem Relation Age of Onset   Breast cancer Mother 6   Colon polyps Mother        unspecified number   Uterine cancer Mother        metastatic   Endometrial cancer Mother 11   Stroke Father 25   Rheum arthritis Sister    Breast cancer Sister 48       lump; (-)GT (breast/ova ca panel, GeneDx)   Breast cancer Maternal Grandmother 16   Skin cancer Maternal Grandfather        unspecified type   Prostate cancer Maternal Grandfather        dx. late 56s   Breast cancer Cousin        bilateral;  dx. late 33s   Breast cancer Other        dx. in their late 60s   Breast cancer Other        dx. 78-79   Colon cancer Neg Hx    Esophageal cancer Neg Hx    Rectal cancer Neg Hx    Stomach cancer Neg Hx    Past Surgical History:  Procedure Laterality Date   back ablation  03/2020   back ablation  10/09/2020   BREAST LUMPECTOMY  1995   CARPAL TUNNEL RELEASE  2000   CATARACT EXTRACTION Right 09-05-12   CATARACT EXTRACTION Left 3/15   CESAREAN SECTION  1987   COLONOSCOPY     EYE SURGERY     diabetic retinopathy    HYSTEROSCOPY  02/2001   and D & C   LYMPH NODE DISSECTION  2002   TOE SURGERY  2000   bone removed   Social History   Social History Narrative   Not on file   Immunization History  Administered Date(s) Administered   PFIZER(Purple Top)SARS-COV-2 Vaccination 07/11/2019, 08/01/2019, 02/05/2020, 09/15/2020   Tdap 06/21/2013     Objective: Vital Signs: BP 135/75 (BP Location: Right Arm, Patient Position: Sitting, Cuff Size: Normal)   Pulse 83   Resp 14   Ht 5\' 4"  (1.626 m)   Wt 124 lb (56.2 kg)   LMP 09/19/2008   BMI 21.28 kg/m    Physical Exam Vitals and nursing note reviewed.  Constitutional:      Appearance: She is well-developed.  HENT:     Head: Normocephalic and atraumatic.  Eyes:     Conjunctiva/sclera: Conjunctivae normal.  Cardiovascular:     Rate and Rhythm: Normal rate and regular rhythm.     Heart sounds: Normal heart sounds.  Pulmonary:     Effort: Pulmonary effort is normal.     Breath sounds: Normal breath sounds.  Abdominal:     General: Bowel sounds are normal.     Palpations: Abdomen is soft.  Musculoskeletal:     Cervical back: Normal range of motion.  Lymphadenopathy:     Cervical: No cervical adenopathy.  Skin:    General: Skin is warm and dry.     Capillary Refill: Capillary refill takes less than 2 seconds.  Neurological:     Mental Status: She is alert and oriented to person, place, and time.  Psychiatric:         Behavior: Behavior normal.      Musculoskeletal Exam: C-spine has limited ROM.  Trapezius muscle tension and tenderness.  Shoulder joints, elbow joints, wrist joints, MCPs, PIPs, DIPs have good range of motion with no synovitis.  PIP and DIP thickening consistent with osteoarthritis of both hands.  Hip joints have good range of motion.  Knee joints have good range of motion with no warmth or effusion.  Ankle joints have good range of motion with no tenderness or joint swelling.  CDAI Exam: CDAI Score: 0  Patient Global: 0 / 100; Provider Global: 0 / 100 Swollen: 0 ; Tender: 1  Joint Exam 12/16/2022      Right  Left  Cervical Spine   Tender        Investigation: No additional findings.  Imaging: No results found.  Recent Labs: Lab Results  Component Value Date   WBC 6.2 10/25/2022   HGB 14.3 10/25/2022   PLT 245 10/25/2022   NA 141 10/25/2022   K 4.3 10/25/2022   CL 105 10/25/2022   CO2 29 10/25/2022   GLUCOSE 193 (H) 10/25/2022   BUN 15 10/25/2022   CREATININE 0.93 10/25/2022   BILITOT 0.5 10/25/2022   ALKPHOS 79 05/13/2020   AST 28 10/25/2022   ALT 22 10/25/2022   PROT 7.0 10/25/2022   PROT 7.0 10/25/2022   ALBUMIN 3.9 05/13/2020   CALCIUM 9.7 10/25/2022   GFRAA 66 12/17/2020   QFTBGOLD NEGATIVE 04/06/2017   QFTBGOLDPLUS NEGATIVE 07/19/2022    Speciality Comments: Recieves Enbrel Sureclick through [employer]. Prolia start 01/12/21  Procedures:  No procedures performed Allergies: Chlorhexidine, Denosumab, Macrobid [nitrofurantoin macrocrystal], Molnupiravir, Ciprofloxacin, and Penicillins     Assessment / Plan:     Visit Diagnoses: Sjogren's syndrome with keratoconjunctivitis sicca [facility]): Patient continues to have chronic sicca symptoms.  She has been using eyedrops for dry eyes and keeping her fluid intake Throughout the day for dry mouth.  IFE did not reveal any monoclonal proteins on 10/25/2022.  Discussed the increased risk for developing  lymphoma in patients with Sjogren's syndrome.  No signs of inflammatory arthritis at this time.  She remains on Enbrel 50 mg sq injections every 10 days as prescribed for rheumatoid arthritis. She is advised to notify us if she develops any new or worsening symptoms.  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 without organ or systems involvement [facility]): No joint tenderness or synovitis noted on examination today.  She has not had any signs or symptoms of a rheumatoid arthritis flare.  She has clinically been doing well on Enbrel 50 mg subcutaneous injections every 10 days.  Patient tried spacing the dosing of Enbrel to every 14 days but developed increased pain and stiffness in both hands and had to resume injections every 10 days.  Her rheumatoid arthritis remains well-controlled on the current treatment regimen.  No medication changes will be made at this time.  She was advised notify us if she develops signs or symptoms of a flare.  High risk medication use - Enbrel 50 mg sq injections once every 10 days-spacing due to recurrent infections. CBC and CMP updated on 10/25/22.  Her next lab work will be due in August.  Standing orders for CBC and CMP were placed today. TB gold negative 07/19/22.  No recent or recurrent infections.  Discussed the importance of holding enbrel if she develops signs or symptoms of an infection and to resume once the infection has completely cleared.   - Plan: CBC with Differential/Platelet, COMPLETE METABOLIC PANEL WITH GFR  Iritis: She has not had any signs or symptoms of an iritis flare.  Trigger thumb, left thumb: Not currently symptomatic.  Primary osteoarthritis of both knees -She underwent Visco gel injections in both knees in May 2024 which provided significant relief.  She has no knee joint pain at this time.  No difficulty rising from a seated position.  Both knee joints have good range of motion with no warmth or effusion on exam.  DDD  (degenerative disc disease), cervical -She has limited range of motion with lateral rotation.  Good flexion extension of the C-spine.  Trapezius muscle tension tenderness bilaterally.  No symptoms of radiculopathy at this time.  She has been experiencing tension headaches related to the trapezius muscle tension and tenderness bilaterally.  Patient requested a referral to physical therapy which was placed today.  Plan: Ambulatory referral to Physical Therapy  Trapezius muscle spasm -She has trapezius muscle tension and tenderness bilaterally.  She has been experiencing tension headaches related to the muscular tension she has been experiencing.  A referral to physical therapy was placed today as requested.  Plan: Ambulatory referral to Physical Therapy  DDD (degenerative disc disease), lumbar: Her lower back pain has improved with physical therapy.    Other medical conditions are listed as follows:  Age-related osteoporosis without current pathological fracture - DEXA ordered by Dr. Evlyn Kanner.  Prolia injections x3.  Her most recent injection was administered on 07/27/21.  Patient has discontinued Prolia due to side effects.  History of migraine  Other fatigue  History of gastroesophageal reflux (GERD)  History of diabetes mellitus  History of depression    Orders: Orders Placed This Encounter  Procedures   CBC with Differential/Platelet   COMPLETE METABOLIC PANEL WITH GFR   Ambulatory referral to Physical Therapy   No orders of the defined types were placed in this encounter.   Follow-Up Instructions: Return in about 5 months (around 05/18/2023) for Rheumatoid arthritis, Sjogren's syndrome.   Gearldine Bienenstock, PA-C  Note - This record has been created using  Editor, commissioning.  Chart creation errors have been sought, but may not always  have been located. Such creation errors do not reflect on  the standard of medical care.

## 2022-12-07 DIAGNOSIS — M791 Myalgia, unspecified site: Secondary | ICD-10-CM | POA: Diagnosis not present

## 2022-12-07 DIAGNOSIS — M542 Cervicalgia: Secondary | ICD-10-CM | POA: Diagnosis not present

## 2022-12-07 DIAGNOSIS — G43719 Chronic migraine without aura, intractable, without status migrainosus: Secondary | ICD-10-CM | POA: Diagnosis not present

## 2022-12-07 DIAGNOSIS — G518 Other disorders of facial nerve: Secondary | ICD-10-CM | POA: Diagnosis not present

## 2022-12-09 DIAGNOSIS — I129 Hypertensive chronic kidney disease with stage 1 through stage 4 chronic kidney disease, or unspecified chronic kidney disease: Secondary | ICD-10-CM | POA: Diagnosis not present

## 2022-12-09 DIAGNOSIS — E1040 Type 1 diabetes mellitus with diabetic neuropathy, unspecified: Secondary | ICD-10-CM | POA: Diagnosis not present

## 2022-12-13 DIAGNOSIS — H26493 Other secondary cataract, bilateral: Secondary | ICD-10-CM | POA: Diagnosis not present

## 2022-12-14 DIAGNOSIS — K080 Exfoliation of teeth due to systemic causes: Secondary | ICD-10-CM | POA: Diagnosis not present

## 2022-12-16 ENCOUNTER — Ambulatory Visit: Payer: Medicare Other | Attending: Physician Assistant | Admitting: Physician Assistant

## 2022-12-16 ENCOUNTER — Encounter: Payer: Self-pay | Admitting: Physician Assistant

## 2022-12-16 VITALS — BP 135/75 | HR 83 | Resp 14 | Ht 64.0 in | Wt 124.0 lb

## 2022-12-16 DIAGNOSIS — Z8719 Personal history of other diseases of the digestive system: Secondary | ICD-10-CM

## 2022-12-16 DIAGNOSIS — M5030 Other cervical disc degeneration, unspecified cervical region: Secondary | ICD-10-CM

## 2022-12-16 DIAGNOSIS — Z79899 Other long term (current) drug therapy: Secondary | ICD-10-CM | POA: Diagnosis not present

## 2022-12-16 DIAGNOSIS — M65312 Trigger thumb, left thumb: Secondary | ICD-10-CM

## 2022-12-16 DIAGNOSIS — H209 Unspecified iridocyclitis: Secondary | ICD-10-CM

## 2022-12-16 DIAGNOSIS — M62838 Other muscle spasm: Secondary | ICD-10-CM

## 2022-12-16 DIAGNOSIS — M3501 Sicca syndrome with keratoconjunctivitis: Secondary | ICD-10-CM | POA: Diagnosis not present

## 2022-12-16 DIAGNOSIS — M0579 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 without organ or systems involvement: Secondary | ICD-10-CM | POA: Diagnosis not present

## 2022-12-16 DIAGNOSIS — M170 Bilateral primary osteoarthritis of knee: Secondary | ICD-10-CM

## 2022-12-16 DIAGNOSIS — R5383 Other fatigue: Secondary | ICD-10-CM

## 2022-12-16 DIAGNOSIS — Z8659 Personal history of other mental and behavioral disorders: Secondary | ICD-10-CM

## 2022-12-16 DIAGNOSIS — M5136 Other intervertebral disc degeneration, lumbar region: Secondary | ICD-10-CM

## 2022-12-16 DIAGNOSIS — Z8639 Personal history of other endocrine, nutritional and metabolic disease: Secondary | ICD-10-CM

## 2022-12-16 DIAGNOSIS — Z8669 Personal history of other diseases of the nervous system and sense organs: Secondary | ICD-10-CM

## 2022-12-16 DIAGNOSIS — M810 Age-related osteoporosis without current pathological fracture: Secondary | ICD-10-CM

## 2022-12-16 NOTE — Patient Instructions (Signed)
Standing Labs We placed an order today for your standing lab work.   Please have your standing labs drawn in August and every 3 months   Please have your labs drawn 2 weeks prior to your appointment so that the provider can discuss your lab results at your appointment, if possible.  Please note that you may see your imaging and lab results in MyChart before we have reviewed them. We will contact you once all results are reviewed. Please allow our office up to 72 hours to thoroughly review all of the results before contacting the office for clarification of your results.  WALK-IN LAB HOURS  Monday through Thursday from 8:00 am -12:30 pm and 1:00 pm-5:00 pm and Friday from 8:00 am-12:00 pm.  Patients with office visits requiring labs will be seen before walk-in labs.  You may encounter longer than normal wait times. Please allow additional time. Wait times may be shorter on  Monday and Thursday afternoons.  We do not book appointments for walk-in labs. We appreciate your patience and understanding with our staff.   Labs are drawn by [facility]. Please bring your co-pay at the time of your lab draw.  You may receive a bill from [facility] for your lab work.  Please note if you are on Hydroxychloroquine and and an order has been placed for a Hydroxychloroquine level,  you will need to have it drawn 4 hours or more after your last dose.  If you wish to have your labs drawn at another location, please call the office 24 hours in advance so we can fax the orders.  The office is located at [address], [address], [place], [place] [address]   If you have any questions regarding directions or hours of operation,  please call [phone].   As a reminder, please drink plenty of water prior to coming for your lab work. Thanks!  

## 2022-12-27 DIAGNOSIS — G518 Other disorders of facial nerve: Secondary | ICD-10-CM | POA: Diagnosis not present

## 2022-12-27 DIAGNOSIS — M542 Cervicalgia: Secondary | ICD-10-CM | POA: Diagnosis not present

## 2022-12-27 DIAGNOSIS — M791 Myalgia, unspecified site: Secondary | ICD-10-CM | POA: Diagnosis not present

## 2022-12-27 DIAGNOSIS — G43719 Chronic migraine without aura, intractable, without status migrainosus: Secondary | ICD-10-CM | POA: Diagnosis not present

## 2022-12-28 NOTE — Progress Notes (Deleted)
GYNECOLOGY  VISIT   HPI: 66 y.o.   Married  Caucasian  female   G1P1001 with Patient's last menstrual period was 09/19/2008.   here for   U/S consult  GYNECOLOGIC HISTORY: Patient's last menstrual period was 09/19/2008. Contraception:  PMP Menopausal hormone therapy:  n/a Last mammogram:  04/12/22 Breast Density Cat D, BI-RADS CAT 2 benign  Last pap smear: 10/20/22 neg: HR HPV neg,  12/18/20 neg, 09/21/17 neg: HR HPV neg         OB History     Gravida  1   Para  1   Term  1   Preterm  0   AB  0   Living  1      SAB  0   IAB  0   Ectopic  0   Multiple  0   Live Births  1              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 determined by examination associated with type 1 diabetes mellitus (HCC) 03/18/2020   Stable treated proliferative diabetic retinopathy of right eye determined by examination associated with type 2 diabetes mellitus (HCC) 03/18/2020   History of vitrectomy 03/18/2020   Complicated UTI (urinary tract infection) 12/13/2018   Failure of outpatient treatment 12/11/2018   Acute metabolic encephalopathy 12/11/2018   Fever 12/10/2018   At high risk for breast cancer 05/09/2018   Eosinophilia 01/31/2017   Iritis 01/04/2017   History of joint swelling 08/24/2016   Osteoarthritis of knees, bilateral 08/24/2016   History of migraine 08/24/2016   History of depression 08/24/2016   History of osteoporosis 08/24/2016   DDD (degenerative disc disease), lumbar 08/24/2016   High risk medication use 08/24/2016   Trigger finger, left middle finger  08/24/2016   Trigger finger, right ring finger 08/24/2016   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   Increased risk of breast cancer 07/06/2015   IDDM (insulin dependent diabetes mellitus) 07/06/2015   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 without organ or systems involvement (HCC) 07/06/2015   Sjogren's syndrome (HCC) 07/06/2015   Genetic testing 05/12/2015   Family history of breast cancer 04/15/2015   Fibrocystic breast disease 10/11/2012   GERD 10/01/2009   Chest pain with moderate risk for cardiac etiology 09/02/2009   DYSPHAGIA 09/02/2009   ANXIETY 08/15/2009   ULCER-GASTRIC 08/15/2009   CONSTIPATION 08/15/2009   SLOW TRANSIT CONSTIPATION 08/15/2009   FLATULENCE ERUCTATION AND GAS PAIN 08/15/2009    Past Medical History:  Diagnosis Date   Abnormal ECG    Anemia    Anxiety    Cataract    . Cateracts bil eyes removed   Eczema    GERD (gastroesophageal reflux disease)    Heart murmur    History of COVID-19 06/14/2020   Hypercholesterolemia    IDDM (insulin dependent diabetes mellitus) 1969   Migraine headache    Neuromuscular disorder (HCC)    bil legs & feet - neuropathy   Neuropathy    Osteoporosis    10/2012   Otosclerosis    Pigmented purpura (HCC)    RA (rheumatoid arthritis) (HCC) 2000  S/P cardiac cath 01/2012   Normal coronaries and normal LV function.   Sjoegren syndrome     Past Surgical History:  Procedure Laterality Date   back ablation  03/2020   back ablation  10/09/2020   BREAST LUMPECTOMY  1995   CARPAL TUNNEL RELEASE  2000   CATARACT EXTRACTION Right 09-05-12   CATARACT EXTRACTION Left 3/15   CESAREAN SECTION  1987   COLONOSCOPY     EYE SURGERY     diabetic retinopathy    HYSTEROSCOPY  02/2001   and D & C   LYMPH NODE DISSECTION  2002   TOE SURGERY  2000   bone removed    Current Outpatient Medications  Medication Sig Dispense Refill   aspirin 81 MG tablet Take 81 mg by mouth daily.     calcium carbonate  (OS-CAL) 600 MG TABS Take 600 mg by mouth 2 (two) times daily with a meal.      Continuous Blood Gluc Sensor (FREESTYLE LIBRE 2 SENSOR) MISC CHANGE EVERY 14 DAYS     CONTOUR NEXT TEST test strip   6   cyclobenzaprine (FLEXERIL) 10 MG tablet Take 10 mg by mouth 3 (three) times daily as needed for muscle spasms. Reported on 07/03/2015     DULoxetine (CYMBALTA) 30 MG capsule Take 90 mg by mouth daily.     etanercept (ENBREL SURECLICK) 50 MG/ML injection Inject one pen into skin weekly 12 mL 0   famotidine (PEPCID) 20 MG tablet Take 20 mg by mouth 2 (two) times daily.     furosemide (LASIX) 20 MG tablet Take 10 mg by mouth daily.      insulin aspart (NOVOLOG) 100 UNIT/ML injection Insulin pump     losartan (COZAAR) 50 MG tablet Take 50 mg by mouth daily.     polyethylene glycol (MIRALAX / GLYCOLAX) 17 g packet Take 17 g by mouth 2 (two) times daily.      pravastatin (PRAVACHOL) 20 MG tablet Take 20 mg by mouth daily.      SUMAtriptan (IMITREX) 100 MG tablet Take 100 mg by mouth every 2 (two) hours as needed.     topiramate (TOPAMAX) 200 MG tablet Take 200 mg by mouth daily.      vitamin B-12 (CYANOCOBALAMIN) 1000 MCG tablet Take 1,000 mcg by mouth daily.     No current facility-administered medications for this visit.     ALLERGIES: Chlorhexidine, Denosumab, Macrobid [nitrofurantoin macrocrystal], Molnupiravir, Ciprofloxacin, and Penicillins  Family History  Problem Relation Age of Onset   Breast cancer Mother 66   Colon polyps Mother        unspecified number   Uterine cancer Mother        metastatic   Endometrial cancer Mother 19   Stroke Father 34   Rheum arthritis Sister    Breast cancer Sister 48       lump; (-)GT (breast/ova ca panel, GeneDx)   Breast cancer Maternal Grandmother 66   Skin cancer Maternal Grandfather        unspecified type   Prostate cancer Maternal Grandfather        dx. late 24s   Breast cancer Cousin        bilateral; dx. late 8s   Breast cancer Other         dx. in their late 60s   Breast cancer Other        dx. 78-79   Colon cancer Neg Hx    Esophageal cancer Neg Hx  Rectal cancer Neg Hx    Stomach cancer Neg Hx     Social History   Socioeconomic History   Marital status: Married    Spouse name: Not on file   Number of children: 1   Years of education: Not on file   Highest education level: Not on file  Occupational History   Occupation: disability    Employer: UNEMPLOYED  Tobacco Use   Smoking status: Never    Passive exposure: Never   Smokeless tobacco: Never  Vaping Use   Vaping Use: Never used  Substance and Sexual Activity   Alcohol use: Yes    Alcohol/week: 2.0 standard drinks of alcohol    Types: 1 Shots of liquor, 1 Standard drinks or equivalent per week    Comment: maybe 1-2 cocktails per wk   Drug use: No   Sexual activity: Yes    Partners: Male    Birth control/protection: Post-menopausal    Comment: vasectomy/first intercourse <17  Other Topics Concern   Not on file  Social History Narrative   Not on file   Social Determinants of Health   Financial Resource Strain: Not on file  Food Insecurity: Not on file  Transportation Needs: Not on file  Physical Activity: Not on file  Stress: Not on file  Social Connections: Not on file  Intimate Partner Violence: Not on file    Review of Systems  PHYSICAL EXAMINATION:    LMP 09/19/2008     General appearance: alert, cooperative and appears stated age Head: Normocephalic, without obvious abnormality, atraumatic Neck: no adenopathy, supple, symmetrical, trachea midline and thyroid normal to inspection and palpation Lungs: clear to auscultation bilaterally Breasts: normal appearance, no masses or tenderness, No nipple retraction or dimpling, No nipple discharge or bleeding, No axillary or supraclavicular adenopathy Heart: regular rate and rhythm Abdomen: soft, non-tender, no masses,  no organomegaly Extremities: extremities normal, atraumatic, no  cyanosis or edema Skin: Skin color, texture, turgor normal. No rashes or lesions Lymph nodes: Cervical, supraclavicular, and axillary nodes normal. No abnormal inguinal nodes palpated Neurologic: Grossly normal  Pelvic: External genitalia:  no lesions              Urethra:  normal appearing urethra with no masses, tenderness or lesions              Bartholins and Skenes: normal                 Vagina: normal appearing vagina with normal color and discharge, no lesions              Cervix: no lesions                Bimanual Exam:  Uterus:  normal size, contour, position, consistency, mobility, non-tender              Adnexa: no mass, fullness, tenderness              Rectal exam: {yes no:[id number]}.  Confirms.              Anus:  normal sphincter tone, no lesions  Chaperone was present for exam:  ***  ASSESSMENT     PLAN     An After Visit Summary was printed and given to the patient.  ______ minutes face to face time of which over 50% was spent in counseling.

## 2023-01-04 ENCOUNTER — Other Ambulatory Visit: Payer: Medicare Other | Admitting: Obstetrics and Gynecology

## 2023-01-04 ENCOUNTER — Ambulatory Visit: Payer: Medicare Other

## 2023-01-04 ENCOUNTER — Other Ambulatory Visit: Payer: Medicare Other

## 2023-01-04 DIAGNOSIS — N950 Postmenopausal bleeding: Secondary | ICD-10-CM | POA: Diagnosis not present

## 2023-01-05 ENCOUNTER — Ambulatory Visit: Payer: Medicare Other | Attending: Orthopedic Surgery | Admitting: Physical Therapy

## 2023-01-05 DIAGNOSIS — M5030 Other cervical disc degeneration, unspecified cervical region: Secondary | ICD-10-CM | POA: Insufficient documentation

## 2023-01-05 DIAGNOSIS — R252 Cramp and spasm: Secondary | ICD-10-CM | POA: Diagnosis not present

## 2023-01-05 DIAGNOSIS — M542 Cervicalgia: Secondary | ICD-10-CM

## 2023-01-05 DIAGNOSIS — M62838 Other muscle spasm: Secondary | ICD-10-CM | POA: Diagnosis not present

## 2023-01-05 NOTE — Therapy (Signed)
OUTPATIENT PHYSICAL THERAPY CERVICAL EVALUATION   Patient Name: Becky [name] MRN: [id number] DOB:[birth year], 67 y.o., female Today's Date: 01/05/2023  END OF SESSION:  PT End of Session - 01/05/23 1500     Visit Number 1    Date for PT Re-Evaluation 03/30/23    Progress Note Due on Visit 10    PT Start Time 1445    PT Stop Time 1530    PT Time Calculation (min) 45 min    Activity Tolerance Patient tolerated treatment well    Behavior During Therapy [facility] for tasks assessed/performed             Past Medical History:  Diagnosis Date   Abnormal ECG    Anemia    Anxiety    Cataract    . Cateracts bil eyes removed   Eczema    GERD (gastroesophageal reflux disease)    Heart murmur    History of COVID-19 06/14/2020   Hypercholesterolemia    IDDM (insulin dependent diabetes mellitus) 1969   Migraine headache    Neuromuscular disorder (HCC)    bil legs & feet - neuropathy   Neuropathy    Osteoporosis    10/2012   Otosclerosis    Pigmented purpura (HCC)    RA (rheumatoid arthritis) (HCC) 2000   S/P cardiac cath 01/2012   Normal coronaries and normal LV function.   Sjoegren syndrome    Past Surgical History:  Procedure Laterality Date   back ablation  03/2020   back ablation  10/09/2020   BREAST LUMPECTOMY  1995   CARPAL TUNNEL RELEASE  2000   CATARACT EXTRACTION Right 09-05-12   CATARACT EXTRACTION Left 3/15   CESAREAN SECTION  1987   COLONOSCOPY     EYE SURGERY     diabetic retinopathy    HYSTEROSCOPY  02/2001   and D & C   LYMPH NODE DISSECTION  2002   TOE SURGERY  2000   bone removed   Patient Active Problem List   Diagnosis Date Noted   Posterior vitreous detachment of left eye 09/15/2021   Dry eye syndrome, bilateral 09/15/2021   Scoliosis 07/24/2020   Spondylosis without myelopathy or radiculopathy, lumbar region 07/24/2020   Chronic bilateral low back pain without sciatica 07/24/2020   Posterior vitreous detachment of right eye 03/18/2020    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 determined by examination associated with type 1 diabetes mellitus (HCC) 03/18/2020   Stable treated proliferative diabetic retinopathy of right eye determined by examination associated with type 2 diabetes mellitus (HCC) 03/18/2020   History of vitrectomy 03/18/2020   Complicated UTI (urinary tract infection) 12/13/2018   Failure of outpatient treatment 12/11/2018   Acute metabolic encephalopathy 12/11/2018   Fever 12/10/2018   At high risk for breast cancer 05/09/2018   Eosinophilia 01/31/2017   Iritis 01/04/2017   History of joint swelling 08/24/2016   Osteoarthritis of knees, bilateral 08/24/2016   History of migraine 08/24/2016   History of depression 08/24/2016   History of osteoporosis 08/24/2016   DDD (degenerative disc disease), lumbar 08/24/2016   High risk medication use 08/24/2016   Trigger finger, left middle finger 08/24/2016   Trigger finger, right ring finger 08/24/2016   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   Increased risk of breast cancer 07/06/2015   IDDM (insulin dependent diabetes mellitus) 07/06/2015   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 without organ or systems involvement (HCC) 07/06/2015   Sjogren's syndrome (HCC) 07/06/2015  Genetic testing 05/12/2015   Family history of breast cancer 04/15/2015   Fibrocystic breast disease 10/11/2012   GERD 10/01/2009   Chest pain with moderate risk for cardiac etiology 09/02/2009   DYSPHAGIA 09/02/2009   ANXIETY 08/15/2009   ULCER-GASTRIC 08/15/2009   CONSTIPATION 08/15/2009   SLOW TRANSIT CONSTIPATION 08/15/2009   FLATULENCE ERUCTATION AND GAS PAIN 08/15/2009    PCP: Adrian Prince, MD  REFERRING PROVIDER: Rosendo Gros, MD   REFERRING DIAG:  M50.30 (ICD-10-CM) - DDD (degenerative disc disease), cervical  [phone] (ICD-10-CM) - Trapezius muscle spasm   THERAPY DIAG:  Cervicalgia  Cramp and  spasm  Rationale for Evaluation and Treatment: Rehabilitation  ONSET DATE: chronic   SUBJECTIVE:                                                                                                                                                                                                         SUBJECTIVE STATEMENT: I need you, the exercises aren't working anymore.  The right side of my shoulder and neck feels like a brick and it gives a headache, I go to the headache doc and he gives me trigger point injections, it lasts a few days, but can only go back every few weeks, in between rubbing my head.  My back is doing good though.  If I wake up and my back hurts I can do my PT exercises and it gets better.  The neck though just gets worse, I could stand the pain if it didn't come with a headache Hand dominance: Right  PERTINENT HISTORY:  Chronic low back pain with radiculopathy, myofascial trigger point injections every 6 weeks, back ablation, scoliosis, osteoporosis, RA, Type 1 DM, neuropathy, Sjogren's syndrome   PAIN:  Are you having pain? Yes: NPRS scale: 9/10 Pain location: R Upper trap region, R side neck, severe Headache Pain description: tight, like a brick, headache, into her eye Aggravating factors: not sure, always tight Relieving factors: tries all the stretches, in past PT and TrDN has helped significantly  PRECAUTIONS: None  RED FLAGS: None     WEIGHT BEARING RESTRICTIONS: No  FALLS:  Has patient fallen in last 6 months? No  LIVING ENVIRONMENT: Lives with: lives with their spouse Lives in: House/apartment Stairs: No Has following equipment at home: None  OCCUPATION: retired  PLOF: Independent  PATIENT GOALS: get rid of headache and decrease neck pain  NEXT MD VISIT:   OBJECTIVE:   DIAGNOSTIC FINDINGS:  MRI Cervical Spine 01/12/2022  IMPRESSION: 1. C4-C5 mild spinal canal stenosis and mild right neural foraminal  narrowing. 2. C6-C7 moderate right and  mild left neural foraminal narrowing. 3. C3-C4 mild right neural foraminal narrowing. MRI Lumbar spine 01/12/2022  IMPRESSION: 1. L1-L2 mild right neural foraminal narrowing. Narrowing of the right lateral recess at this level could affect the descending right L2 nerve roots. 2. Multilevel facet arthropathy, which can be a cause of back pain.  PATIENT SURVEYS:  NDI 34/50= 68%  COGNITION: Overall cognitive status: Within functional limits for tasks assessed  SENSATION: Severe neuropathy bil feet, mild neuropathy in hands, sensations intact to light touch in hands, denies numbness or tingling.  POSTURE: rounded shoulders and decreased lumbar lordosis  PALPATION: Tightness/tenderness throughout cervical paraspinals, R UT/levator scapulae   CERVICAL ROM:   Active ROM A/PROM (deg) eval  Flexion 35p!  Extension 30  Right lateral flexion 30p!  Left lateral flexion 30p!  Right rotation 50p!  Left rotation 60   (Blank rows = not tested)  UPPER EXTREMITY ROM: full symmetric AROM for both shoulders, reports history of RA and chronic L shoulder pain  UPPER EXTREMITY MMT:  no deficits in bil UE  CERVICAL SPECIAL TESTS:  Spurling's test: Negative and Sharp pursor's test: Negative  TODAY'S TREATMENT:                                                                                                                              DATE:   Manual Therapy: to decrease muscle spasm and pain and improve mobility STM/TPR to cervical paraspinals, R UT & levator, skilled palpation and monitoring during dry needling. Trigger Point Dry-Needling  Treatment instructions: Expect mild to moderate muscle soreness. S/S of pneumothorax if dry needled over a lung field, and to seek immediate medical attention should they occur. Patient verbalized understanding of these instructions and education. Patient Consent Given: Yes Education handout provided: Previously provided Muscles treated: R UT, R L/S, R  cervical multifidi C3-6 Electrical stimulation performed: No Parameters: N/A Treatment response/outcome: Twitch Response Elicited and Palpable Increase in Muscle Length    PATIENT EDUCATION:  Education details: findings, POC Person educated: Patient Education method: Explanation Education comprehension: verbalized understanding  HOME EXERCISE PROGRAM: Access Code: [email or web address]   ASSESSMENT:  CLINICAL IMPRESSION: Becky [name]  is a 67 y.o. right hand dominant female who was seen today for physical therapy evaluation and treatment for cervical degenerative disc disease and trapezius muscle spasm, causing chronic neck pain and severe headache. She demonstrates hypomobility throughout cervical spine, impaired posture, and tenderness and trigger points throughout cervical musculature. She did not have strength deficits. She would benefit from skilled physical therapy to decrease neck pain and headache, improve vertebral mobility, improve posture and tolerance to activity, and also improve sleep for QOL.  She has had previous episodes of PT and has been compliant with HEP from previous episode, however no longer able to control pain with HEP alone.  Since she has benefited in the past significantly from TrDN in the past, she consented today to  TrDN and reported significant decrease in muscle tension, pain and headache following interventions.    OBJECTIVE IMPAIRMENTS: decreased ROM, hypomobility, increased fascial restrictions, increased muscle spasms, postural dysfunction, and pain.   ACTIVITY LIMITATIONS: carrying, bending, and sleeping  PARTICIPATION LIMITATIONS: interpersonal relationship and driving  PERSONAL FACTORS: Age, Past/current experiences, Time since onset of injury/illness/exacerbation, and 3+ comorbidities: Chronic low back pain with radiculopathy, myofascial trigger point injections every 6 weeks, back ablation, scoliosis, osteoporosis, RA, Type 1 DM, neuropathy, Sjogren's  syndrome  are also affecting patient's functional outcome.   REHAB POTENTIAL: Good  CLINICAL DECISION MAKING: Evolving/moderate complexity  EVALUATION COMPLEXITY: Moderate   GOALS: Goals reviewed with patient? Yes  SHORT TERM GOALS: Target date: 01/19/2023   Patient will be independent with initial HEP.  Baseline: needs review Goal status: INITIAL  LONG TERM GOALS: Target date: 02/16/2023   Patient will be independent with advanced/ongoing HEP to improve outcomes and carryover.  Baseline:  Goal status: INITIAL  2.  Patient will report 75% improvement in neck pain to improve QOL.  Baseline:  Goal status: INITIAL  3.  Patient will demonstrate full pain free cervical ROM for safety with driving.  Baseline: see objective Goal status: INITIAL  4.  Patient will report 7 points or more improvement on NDI to demonstrate improved functional ability.  Baseline: 34/50 Goal status: INITIAL  5.  Patient will report 75% improvement in frequency and intensity of headaches. Baseline:  Goal status: INITIAL   PLAN:  PT FREQUENCY: 2x/week for 3 weeks, then 1x/week  PT DURATION: 12 weeks  PLANNED INTERVENTIONS: Therapeutic exercises, Therapeutic activity, Neuromuscular re-education, Balance training, Gait training, Patient/Family education, Self Care, Joint mobilization, Dry Needling, Electrical stimulation, Spinal manipulation, Spinal mobilization, Cryotherapy, Moist heat, Taping, Traction, Ultrasound, Manual therapy, and Re-evaluation  PLAN FOR NEXT SESSION: review previous HEP, posterior shoulder strengthening as well as stretches, continue manual therapy and TrDN    Jena Gauss, PT 01/05/2023, 4:41 PM

## 2023-01-07 ENCOUNTER — Telehealth: Payer: Self-pay | Admitting: Obstetrics and Gynecology

## 2023-01-07 NOTE — Telephone Encounter (Signed)
Please contact patient in follow up to her ultrasound done for postmenopausal bleeding.   Her uterus and ovaries are normal.   Please have her contact me with any future vaginal bleeding.

## 2023-01-10 ENCOUNTER — Other Ambulatory Visit: Payer: Self-pay | Admitting: *Deleted

## 2023-01-10 DIAGNOSIS — Z79899 Other long term (current) drug therapy: Secondary | ICD-10-CM

## 2023-01-10 DIAGNOSIS — Z794 Long term (current) use of insulin: Secondary | ICD-10-CM | POA: Diagnosis not present

## 2023-01-10 DIAGNOSIS — H26491 Other secondary cataract, right eye: Secondary | ICD-10-CM | POA: Diagnosis not present

## 2023-01-10 DIAGNOSIS — E109 Type 1 diabetes mellitus without complications: Secondary | ICD-10-CM | POA: Diagnosis not present

## 2023-01-10 MED ORDER — ENBREL SURECLICK 50 MG/ML ~~LOC~~ SOAJ
SUBCUTANEOUS | 0 refills | Status: DC
Start: 2023-01-10 — End: 2023-04-11

## 2023-01-10 NOTE — Telephone Encounter (Signed)
Last Fill: 04/08/2022  Labs: 10/25/2022 CBC stable. Glucose is 193. Rest of CMP WNL. ESR WNL UA normal. C3 remains borderline low but stable. C4 WNL.  IFE normal pattern-no monoclonal proteins noted   TB Gold: 07/19/2022  TB gold negative   Next Visit: 05/25/2023  Last Visit: 12/16/2022  DX: Sjogren's syndrome with keratoconjunctivitis sicca   Current Dose per office note 12/16/2022: Enbrel 50 mg sq injections once every 10 days-spacing due to recurrent infections.   Patient is requesting to return to every week instead of every 10 days due to finger swelling.  Okay to refill Enbrel?

## 2023-01-10 NOTE — Telephone Encounter (Signed)
Mychart msg sent informing pt.  °

## 2023-01-11 ENCOUNTER — Encounter: Payer: Self-pay | Admitting: Physical Therapy

## 2023-01-11 ENCOUNTER — Ambulatory Visit: Payer: Medicare Other | Admitting: Physical Therapy

## 2023-01-11 DIAGNOSIS — M5030 Other cervical disc degeneration, unspecified cervical region: Secondary | ICD-10-CM | POA: Diagnosis not present

## 2023-01-11 DIAGNOSIS — R252 Cramp and spasm: Secondary | ICD-10-CM | POA: Diagnosis not present

## 2023-01-11 DIAGNOSIS — M542 Cervicalgia: Secondary | ICD-10-CM

## 2023-01-11 DIAGNOSIS — M62838 Other muscle spasm: Secondary | ICD-10-CM | POA: Diagnosis not present

## 2023-01-11 NOTE — Therapy (Signed)
OUTPATIENT PHYSICAL THERAPY TREATMENT   Patient Name: Becky [name] MRN: [id number] DOB:12-07-1955, 67 y.o., female Today's Date: 01/11/2023  END OF SESSION:  PT End of Session - 01/11/23 1407     Visit Number 2    Date for PT Re-Evaluation 03/30/23    Authorization Type [employer]    Progress Note Due on Visit 10    PT Start Time 1405    PT Stop Time 1445    PT Time Calculation (min) 40 min    Activity Tolerance Patient tolerated treatment well    Behavior During Therapy WFL for tasks assessed/performed             Past Medical History:  Diagnosis Date   Abnormal ECG    Anemia    Anxiety    Cataract    . Cateracts bil eyes removed   Eczema    GERD (gastroesophageal reflux disease)    Heart murmur    History of COVID-19 06/14/2020   Hypercholesterolemia    IDDM (insulin dependent diabetes mellitus) 1969   Migraine headache    Neuromuscular disorder (HCC)    bil legs & feet - neuropathy   Neuropathy    Osteoporosis    10/2012   Otosclerosis    Pigmented purpura (HCC)    RA (rheumatoid arthritis) (HCC) 2000   S/P cardiac cath 01/2012   Normal coronaries and normal LV function.   Sjoegren syndrome    Past Surgical History:  Procedure Laterality Date   back ablation  03/2020   back ablation  10/09/2020   BREAST LUMPECTOMY  1995   CARPAL TUNNEL RELEASE  2000   CATARACT EXTRACTION Right 09-05-12   CATARACT EXTRACTION Left 3/15   CESAREAN SECTION  1987   COLONOSCOPY     EYE SURGERY     diabetic retinopathy    HYSTEROSCOPY  02/2001   and D & C   LYMPH NODE DISSECTION  2002   TOE SURGERY  2000   bone removed   Patient Active Problem List   Diagnosis Date Noted   Posterior vitreous detachment of left eye 09/15/2021   Dry eye syndrome, bilateral 09/15/2021   Scoliosis 07/24/2020   Spondylosis without myelopathy or radiculopathy, lumbar region 07/24/2020   Chronic bilateral low back pain without sciatica 07/24/2020   Posterior vitreous detachment  of right eye 03/18/2020   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 determined by examination associated with type 1 diabetes mellitus (HCC) 03/18/2020   Stable treated proliferative diabetic retinopathy of right eye determined by examination associated with type 2 diabetes mellitus (HCC) 03/18/2020   History of vitrectomy 03/18/2020   Complicated UTI (urinary tract infection) 12/13/2018   Failure of outpatient treatment 12/11/2018   Acute metabolic encephalopathy 12/11/2018   Fever 12/10/2018   At high risk for breast cancer 05/09/2018   Eosinophilia 01/31/2017   Iritis 01/04/2017   History of joint swelling 08/24/2016   Osteoarthritis of knees, bilateral 08/24/2016   History of migraine 08/24/2016   History of depression 08/24/2016   History of osteoporosis 08/24/2016   DDD (degenerative disc disease), lumbar 08/24/2016   High risk medication use 08/24/2016   Trigger finger, left middle finger 08/24/2016   Trigger finger, right ring finger 08/24/2016   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   Increased risk of breast cancer 07/06/2015   IDDM (insulin dependent diabetes mellitus) 07/06/2015   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 without organ or systems involvement (HCC) 07/06/2015  Sjogren's syndrome (HCC) 07/06/2015   Genetic testing 05/12/2015   Family history of breast cancer 04/15/2015   Fibrocystic breast disease 10/11/2012   GERD 10/01/2009   Chest pain with moderate risk for cardiac etiology 09/02/2009   DYSPHAGIA 09/02/2009   ANXIETY 08/15/2009   ULCER-GASTRIC 08/15/2009   CONSTIPATION 08/15/2009   SLOW TRANSIT CONSTIPATION 08/15/2009   FLATULENCE ERUCTATION AND GAS PAIN 08/15/2009    PCP: Adrian Prince, MD  REFERRING PROVIDER: Rosendo Gros, MD   REFERRING DIAG:  M50.30 (ICD-10-CM) - DDD (degenerative disc disease), cervical  [phone] (ICD-10-CM) - Trapezius muscle spasm   THERAPY DIAG:   Cervicalgia  Cramp and spasm  Rationale for Evaluation and Treatment: Rehabilitation  ONSET DATE: chronic   SUBJECTIVE:                                                                                                                                                                                                         SUBJECTIVE STATEMENT: It feels much better after you worked on it, it's like a 7 compared to 15.5 like last time.  Hand dominance: Right  PERTINENT HISTORY:  Chronic low back pain with radiculopathy, myofascial trigger point injections every 6 weeks, back ablation, scoliosis, osteoporosis, RA, Type 1 DM, neuropathy, Sjogren's syndrome   PAIN:  Are you having pain? Yes: NPRS scale: 7/10 Pain location: R Upper trap region, R side neck, severe Headache Pain description: tight, like a brick, headache, into her eye Aggravating factors: not sure, always tight Relieving factors: tries all the stretches, in past PT and TrDN has helped significantly  PRECAUTIONS: None  RED FLAGS: None     WEIGHT BEARING RESTRICTIONS: No  FALLS:  Has patient fallen in last 6 months? No  LIVING ENVIRONMENT: Lives with: lives with their spouse Lives in: House/apartment Stairs: No Has following equipment at home: None  OCCUPATION: retired  PLOF: Independent  PATIENT GOALS: get rid of headache and decrease neck pain  NEXT MD VISIT:   OBJECTIVE:   DIAGNOSTIC FINDINGS:  MRI Cervical Spine 01/12/2022  IMPRESSION: 1. C4-C5 mild spinal canal stenosis and mild right neural foraminal narrowing. 2. C6-C7 moderate right and mild left neural foraminal narrowing. 3. C3-C4 mild right neural foraminal narrowing. MRI Lumbar spine 01/12/2022  IMPRESSION: 1. L1-L2 mild right neural foraminal narrowing. Narrowing of the right lateral recess at this level could affect the descending right L2 nerve roots. 2. Multilevel facet arthropathy, which can be a cause of back pain.  PATIENT SURVEYS:   NDI 34/50= 68%  COGNITION: Overall cognitive status: Within functional limits  for tasks assessed  SENSATION: Severe neuropathy bil feet, mild neuropathy in hands, sensations intact to light touch in hands, denies numbness or tingling.  POSTURE: rounded shoulders and decreased lumbar lordosis  PALPATION: Tightness/tenderness throughout cervical paraspinals, R UT/levator scapulae   CERVICAL ROM:   Active ROM A/PROM (deg) eval  Flexion 35p!  Extension 30  Right lateral flexion 30p!  Left lateral flexion 30p!  Right rotation 50p!  Left rotation 60   (Blank rows = not tested)  UPPER EXTREMITY ROM: full symmetric AROM for both shoulders, reports history of RA and chronic L shoulder pain  UPPER EXTREMITY MMT:  no deficits in bil UE  CERVICAL SPECIAL TESTS:  Spurling's test: Negative and Sharp pursor's test: Negative  TODAY'S TREATMENT:                                                                                                                              DATE:   01/11/23 Manual Therapy: to decrease muscle spasm and pain and improve mobility STM/TPR to cervical paraspinals, UPA mobs to cervical spine, suboccipital release, PA mobs thoracic spine grade 2-3, STM/TPR to bil UT & levator, skilled palpation and monitoring during dry needling. Trigger Point Dry-Needling  Treatment instructions: Expect mild to moderate muscle soreness. S/S of pneumothorax if dry needled over a lung field, and to seek immediate medical attention should they occur. Patient verbalized understanding of these instructions and education. Patient Consent Given: Yes Education handout provided: Previously provided Muscles treated: bil UT, L/S, bil cervical multifidi C3-T1 Electrical stimulation performed: No Parameters: N/A Treatment response/outcome: Twitch Response Elicited and Palpable Increase in Muscle Length  01/05/23 Manual Therapy: to decrease muscle spasm and pain and improve mobility STM/TPR to  cervical paraspinals, R UT & levator, skilled palpation and monitoring during dry needling. Trigger Point Dry-Needling  Treatment instructions: Expect mild to moderate muscle soreness. S/S of pneumothorax if dry needled over a lung field, and to seek immediate medical attention should they occur. Patient verbalized understanding of these instructions and education. Patient Consent Given: Yes Education handout provided: Previously provided Muscles treated: R UT, R L/S, R cervical multifidi C3-6 Electrical stimulation performed: No Parameters: N/A Treatment response/outcome: Twitch Response Elicited and Palpable Increase in Muscle Length    PATIENT EDUCATION:  Education details: TrDN Person educated: Patient Education method: Explanation Education comprehension: verbalized understanding  HOME EXERCISE PROGRAM: Access Code: [email or web address]   ASSESSMENT:  CLINICAL IMPRESSION: Becky [name]  reports significant improvement overall from initial session, but continues to have severe neck pain and headache, so focus on today's skilled intervention was manual therapy and TrDN to cervical paraspinals, bil UT/LS to decrease spasm and pain and improve cervical mobility.  Decreased pain reported after interventions.  [name] continues to demonstrate potential for improvement and would benefit from continued skilled therapy to address impairments.      OBJECTIVE IMPAIRMENTS: decreased ROM, hypomobility, increased fascial restrictions, increased muscle spasms, postural dysfunction, and pain.  ACTIVITY LIMITATIONS: carrying, bending, and sleeping  PARTICIPATION LIMITATIONS: interpersonal relationship and driving  PERSONAL FACTORS: Age, Past/current experiences, Time since onset of injury/illness/exacerbation, and 3+ comorbidities: Chronic low back pain with radiculopathy, myofascial trigger point injections every 6 weeks, back ablation, scoliosis, osteoporosis, RA, Type 1 DM, neuropathy,  Sjogren's syndrome  are also affecting patient's functional outcome.   REHAB POTENTIAL: Good  CLINICAL DECISION MAKING: Evolving/moderate complexity  EVALUATION COMPLEXITY: Moderate   GOALS: Goals reviewed with patient? Yes  SHORT TERM GOALS: Target date: 01/25/2023   Patient will be independent with initial HEP.  Baseline: needs review Goal status: INITIAL  LONG TERM GOALS: Target date: 02/22/2023   Patient will be independent with advanced/ongoing HEP to improve outcomes and carryover.  Baseline:  Goal status: INITIAL  2.  Patient will report 75% improvement in neck pain to improve QOL.  Baseline:  Goal status: INITIAL  3.  Patient will demonstrate full pain free cervical ROM for safety with driving.  Baseline: see objective Goal status: INITIAL  4.  Patient will report 7 points or more improvement on NDI to demonstrate improved functional ability.  Baseline: 34/50 Goal status: INITIAL  5.  Patient will report 75% improvement in frequency and intensity of headaches. Baseline:  Goal status: INITIAL   PLAN:  PT FREQUENCY: 2x/week for 3 weeks, then 1x/week  PT DURATION: 12 weeks  PLANNED INTERVENTIONS: Therapeutic exercises, Therapeutic activity, Neuromuscular re-education, Balance training, Gait training, Patient/Family education, Self Care, Joint mobilization, Dry Needling, Electrical stimulation, Spinal manipulation, Spinal mobilization, Cryotherapy, Moist heat, Taping, Traction, Ultrasound, Manual therapy, and Re-evaluation  PLAN FOR NEXT SESSION: review previous HEP, posterior shoulder strengthening as well as stretches, continue manual therapy and TrDN    Jena Gauss, PT 01/11/2023, 3:00 PM

## 2023-01-13 ENCOUNTER — Encounter: Payer: Self-pay | Admitting: Physical Therapy

## 2023-01-13 ENCOUNTER — Ambulatory Visit: Payer: Medicare Other | Admitting: Physical Therapy

## 2023-01-13 DIAGNOSIS — M542 Cervicalgia: Secondary | ICD-10-CM | POA: Diagnosis not present

## 2023-01-13 DIAGNOSIS — M62838 Other muscle spasm: Secondary | ICD-10-CM | POA: Diagnosis not present

## 2023-01-13 DIAGNOSIS — M5030 Other cervical disc degeneration, unspecified cervical region: Secondary | ICD-10-CM | POA: Diagnosis not present

## 2023-01-13 DIAGNOSIS — R252 Cramp and spasm: Secondary | ICD-10-CM | POA: Diagnosis not present

## 2023-01-13 NOTE — Therapy (Signed)
OUTPATIENT PHYSICAL THERAPY TREATMENT   Patient Name: Becky [name] MRN: [id number] DOB:Mar 07, 1956, 67 y.o., female Today's Date: 01/13/2023  END OF SESSION:  PT End of Session - 01/13/23 0928     Visit Number 3    Date for PT Re-Evaluation 03/30/23    Authorization Type [employer]    Progress Note Due on Visit 10    PT Start Time 0930    PT Stop Time 1014    PT Time Calculation (min) 44 min    Activity Tolerance Patient tolerated treatment well    Behavior During Therapy WFL for tasks assessed/performed             Past Medical History:  Diagnosis Date   Abnormal ECG    Anemia    Anxiety    Cataract    . Cateracts bil eyes removed   Eczema    GERD (gastroesophageal reflux disease)    Heart murmur    History of COVID-19 06/14/2020   Hypercholesterolemia    IDDM (insulin dependent diabetes mellitus) 1969   Migraine headache    Neuromuscular disorder (HCC)    bil legs & feet - neuropathy   Neuropathy    Osteoporosis    10/2012   Otosclerosis    Pigmented purpura (HCC)    RA (rheumatoid arthritis) (HCC) 2000   S/P cardiac cath 01/2012   Normal coronaries and normal LV function.   Sjoegren syndrome    Past Surgical History:  Procedure Laterality Date   back ablation  03/2020   back ablation  10/09/2020   BREAST LUMPECTOMY  1995   CARPAL TUNNEL RELEASE  2000   CATARACT EXTRACTION Right 09-05-12   CATARACT EXTRACTION Left 3/15   CESAREAN SECTION  1987   COLONOSCOPY     EYE SURGERY     diabetic retinopathy    HYSTEROSCOPY  02/2001   and D & C   LYMPH NODE DISSECTION  2002   TOE SURGERY  2000   bone removed   Patient Active Problem List   Diagnosis Date Noted   Posterior vitreous detachment of left eye 09/15/2021   Dry eye syndrome, bilateral 09/15/2021   Scoliosis 07/24/2020   Spondylosis without myelopathy or radiculopathy, lumbar region 07/24/2020   Chronic bilateral low back pain without sciatica 07/24/2020   Posterior vitreous detachment  of right eye 03/18/2020   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 determined by examination associated with type 1 diabetes mellitus (HCC) 03/18/2020   Stable treated proliferative diabetic retinopathy of right eye determined by examination associated with type 2 diabetes mellitus (HCC) 03/18/2020   History of vitrectomy 03/18/2020   Complicated UTI (urinary tract infection) 12/13/2018   Failure of outpatient treatment 12/11/2018   Acute metabolic encephalopathy 12/11/2018   Fever 12/10/2018   At high risk for breast cancer 05/09/2018   Eosinophilia 01/31/2017   Iritis 01/04/2017   History of joint swelling 08/24/2016   Osteoarthritis of knees, bilateral 08/24/2016   History of migraine 08/24/2016   History of depression 08/24/2016   History of osteoporosis 08/24/2016   DDD (degenerative disc disease), lumbar 08/24/2016   High risk medication use 08/24/2016   Trigger finger, left middle finger 08/24/2016   Trigger finger, right ring finger 08/24/2016   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   Increased risk of breast cancer 07/06/2015   IDDM (insulin dependent diabetes mellitus) 07/06/2015   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 without organ or systems involvement (HCC) 07/06/2015  Sjogren's syndrome (HCC) 07/06/2015   Genetic testing 05/12/2015   Family history of breast cancer 04/15/2015   Fibrocystic breast disease 10/11/2012   GERD 10/01/2009   Chest pain with moderate risk for cardiac etiology 09/02/2009   DYSPHAGIA 09/02/2009   ANXIETY 08/15/2009   ULCER-GASTRIC 08/15/2009   CONSTIPATION 08/15/2009   SLOW TRANSIT CONSTIPATION 08/15/2009   FLATULENCE ERUCTATION AND GAS PAIN 08/15/2009    PCP: Adrian Prince, MD  REFERRING PROVIDER: Rosendo Gros, MD   REFERRING DIAG:  M50.30 (ICD-10-CM) - DDD (degenerative disc disease), cervical  [phone] (ICD-10-CM) - Trapezius muscle spasm   THERAPY DIAG:   Cervicalgia  Cramp and spasm  Rationale for Evaluation and Treatment: Rehabilitation  ONSET DATE: chronic   SUBJECTIVE:                                                                                                                                                                                                         SUBJECTIVE STATEMENT: My headache is much better, but my neck feels very tight, I just cried and cried yesterday because of my dog.  Hand dominance: Right  PERTINENT HISTORY:  Chronic low back pain with radiculopathy, myofascial trigger point injections every 6 weeks, back ablation, scoliosis, osteoporosis, RA, Type 1 DM, neuropathy, Sjogren's syndrome   PAIN:  Are you having pain? Yes: NPRS scale: 8/10 Pain location: R Upper trap region, R side neck, severe Headache Pain description: tight, like a brick, headache, into her eye Aggravating factors: not sure, always tight Relieving factors: tries all the stretches, in past PT and TrDN has helped significantly  PRECAUTIONS: None  RED FLAGS: None     WEIGHT BEARING RESTRICTIONS: No  FALLS:  Has patient fallen in last 6 months? No  LIVING ENVIRONMENT: Lives with: lives with their spouse Lives in: House/apartment Stairs: No Has following equipment at home: None  OCCUPATION: retired  PLOF: Independent  PATIENT GOALS: get rid of headache and decrease neck pain  NEXT MD VISIT:   OBJECTIVE:   DIAGNOSTIC FINDINGS:  MRI Cervical Spine 01/12/2022  IMPRESSION: 1. C4-C5 mild spinal canal stenosis and mild right neural foraminal narrowing. 2. C6-C7 moderate right and mild left neural foraminal narrowing. 3. C3-C4 mild right neural foraminal narrowing. MRI Lumbar spine 01/12/2022  IMPRESSION: 1. L1-L2 mild right neural foraminal narrowing. Narrowing of the right lateral recess at this level could affect the descending right L2 nerve roots. 2. Multilevel facet arthropathy, which can be a cause of back  pain.  PATIENT SURVEYS:  NDI 34/50= 68%  COGNITION: Overall cognitive status: Within  functional limits for tasks assessed  SENSATION: Severe neuropathy bil feet, mild neuropathy in hands, sensations intact to light touch in hands, denies numbness or tingling.  POSTURE: rounded shoulders and decreased lumbar lordosis  PALPATION: Tightness/tenderness throughout cervical paraspinals, R UT/levator scapulae   CERVICAL ROM:   Active ROM A/PROM (deg) eval  Flexion 35p!  Extension 30  Right lateral flexion 30p!  Left lateral flexion 30p!  Right rotation 50p!  Left rotation 60   (Blank rows = not tested)  UPPER EXTREMITY ROM: full symmetric AROM for both shoulders, reports history of RA and chronic L shoulder pain  UPPER EXTREMITY MMT:  no deficits in bil UE  CERVICAL SPECIAL TESTS:  Spurling's test: Negative and Sharp pursor's test: Negative  TODAY'S TREATMENT:                                                                                                                              DATE:   01/13/23 Manual Therapy: to decrease muscle spasm and pain and improve mobility STM/TPR to cervical paraspinals, masseter, UPA mobs to cervical spine, suboccipital release, PA mobs thoracic spine grade 2-3, STM/TPR to bil UT & levator, skilled palpation and monitoring during dry needling. Trigger Point Dry-Needling  Treatment instructions: Expect mild to moderate muscle soreness. S/S of pneumothorax if dry needled over a lung field, and to seek immediate medical attention should they occur. Patient verbalized understanding of these instructions and education. Patient Consent Given: Yes Education handout provided: Previously provided Muscles treated: bil UT, L/S, bil cervical multifidi C3-C6 Electrical stimulation performed: No Parameters: N/A Treatment response/outcome: Twitch Response Elicited and Palpable Increase in Muscle Length  01/11/23 Manual Therapy: to decrease muscle spasm and  pain and improve mobility STM/TPR to cervical paraspinals, UPA mobs to cervical spine, suboccipital release, PA mobs thoracic spine grade 2-3, STM/TPR to bil UT & levator, skilled palpation and monitoring during dry needling. Trigger Point Dry-Needling  Treatment instructions: Expect mild to moderate muscle soreness. S/S of pneumothorax if dry needled over a lung field, and to seek immediate medical attention should they occur. Patient verbalized understanding of these instructions and education. Patient Consent Given: Yes Education handout provided: Previously provided Muscles treated: bil UT, L/S, bil cervical multifidi C3-T1 Electrical stimulation performed: No Parameters: N/A Treatment response/outcome: Twitch Response Elicited and Palpable Increase in Muscle Length  01/05/23 Manual Therapy: to decrease muscle spasm and pain and improve mobility STM/TPR to cervical paraspinals, R UT & levator, skilled palpation and monitoring during dry needling. Trigger Point Dry-Needling  Treatment instructions: Expect mild to moderate muscle soreness. S/S of pneumothorax if dry needled over a lung field, and to seek immediate medical attention should they occur. Patient verbalized understanding of these instructions and education. Patient Consent Given: Yes Education handout provided: Previously provided Muscles treated: R UT, R L/S, R cervical multifidi C3-6 Electrical stimulation performed: No Parameters: N/A Treatment response/outcome: Twitch Response Elicited and Palpable Increase in Muscle Length    PATIENT  EDUCATION:  Education details: TrDN Person educated: Patient Education method: Explanation Education comprehension: verbalized understanding  HOME EXERCISE PROGRAM: Access Code: [email or web address]   ASSESSMENT:  CLINICAL IMPRESSION: Becky [name]  reports improvement in headache, neck still feels very tight but was very upset yesterday.  Continued to focus on manual therapy, with TrDN to  relieve muscle spasm followed my joint mobilization, also worked jaw musculature, patient reports history of impaired bite and grinding teeth which may also be contributing to headache. [name] continues to demonstrate potential for improvement and would benefit from continued skilled therapy to address impairments.      OBJECTIVE IMPAIRMENTS: decreased ROM, hypomobility, increased fascial restrictions, increased muscle spasms, postural dysfunction, and pain.   ACTIVITY LIMITATIONS: carrying, bending, and sleeping  PARTICIPATION LIMITATIONS: interpersonal relationship and driving  PERSONAL FACTORS: Age, Past/current experiences, Time since onset of injury/illness/exacerbation, and 3+ comorbidities: Chronic low back pain with radiculopathy, myofascial trigger point injections every 6 weeks, back ablation, scoliosis, osteoporosis, RA, Type 1 DM, neuropathy, Sjogren's syndrome  are also affecting patient's functional outcome.   REHAB POTENTIAL: Good  CLINICAL DECISION MAKING: Evolving/moderate complexity  EVALUATION COMPLEXITY: Moderate   GOALS: Goals reviewed with patient? Yes  SHORT TERM GOALS: Target date: 01/27/2023   Patient will be independent with initial HEP.  Baseline: needs review Goal status: INITIAL  LONG TERM GOALS: Target date: 02/24/2023   Patient will be independent with advanced/ongoing HEP to improve outcomes and carryover.  Baseline:  Goal status: INITIAL  2.  Patient will report 75% improvement in neck pain to improve QOL.  Baseline:  Goal status: INITIAL  3.  Patient will demonstrate full pain free cervical ROM for safety with driving.  Baseline: see objective Goal status: INITIAL  4.  Patient will report 7 points or more improvement on NDI to demonstrate improved functional ability.  Baseline: 34/50 Goal status: INITIAL  5.  Patient will report 75% improvement in frequency and intensity of headaches. Baseline:  Goal status:  INITIAL   PLAN:  PT FREQUENCY: 2x/week for 3 weeks, then 1x/week  PT DURATION: 12 weeks  PLANNED INTERVENTIONS: Therapeutic exercises, Therapeutic activity, Neuromuscular re-education, Balance training, Gait training, Patient/Family education, Self Care, Joint mobilization, Dry Needling, Electrical stimulation, Spinal manipulation, Spinal mobilization, Cryotherapy, Moist heat, Taping, Traction, Ultrasound, Manual therapy, and Re-evaluation  PLAN FOR NEXT SESSION: review previous HEP, posterior shoulder strengthening as well as stretches, continue manual therapy and TrDN    Jena Gauss, PT 01/13/2023, 10:34 AM

## 2023-01-17 DIAGNOSIS — E109 Type 1 diabetes mellitus without complications: Secondary | ICD-10-CM | POA: Diagnosis not present

## 2023-01-19 DIAGNOSIS — H26491 Other secondary cataract, right eye: Secondary | ICD-10-CM | POA: Diagnosis not present

## 2023-01-20 ENCOUNTER — Encounter: Payer: Self-pay | Admitting: Physical Therapy

## 2023-01-20 ENCOUNTER — Ambulatory Visit: Payer: Medicare Other | Attending: Orthopedic Surgery | Admitting: Physical Therapy

## 2023-01-20 DIAGNOSIS — H8111 Benign paroxysmal vertigo, right ear: Secondary | ICD-10-CM | POA: Diagnosis not present

## 2023-01-20 DIAGNOSIS — R252 Cramp and spasm: Secondary | ICD-10-CM | POA: Insufficient documentation

## 2023-01-20 DIAGNOSIS — M542 Cervicalgia: Secondary | ICD-10-CM | POA: Insufficient documentation

## 2023-01-20 NOTE — Therapy (Signed)
OUTPATIENT PHYSICAL THERAPY TREATMENT   Patient Name: Becky [name] MRN: [id number] DOB:1955/09/30, 67 y.o., female Today's Date: 01/20/2023  END OF SESSION:  PT End of Session - 01/20/23 1315     Visit Number 4    Date for PT Re-Evaluation 03/30/23    Authorization Type [employer]    Progress Note Due on Visit 10    PT Start Time 1315    PT Stop Time 1400    PT Time Calculation (min) 45 min    Activity Tolerance Patient tolerated treatment well    Behavior During Therapy WFL for tasks assessed/performed             Past Medical History:  Diagnosis Date   Abnormal ECG    Anemia    Anxiety    Cataract    . Cateracts bil eyes removed   Eczema    GERD (gastroesophageal reflux disease)    Heart murmur    History of COVID-19 06/14/2020   Hypercholesterolemia    IDDM (insulin dependent diabetes mellitus) 1969   Migraine headache    Neuromuscular disorder (HCC)    bil legs & feet - neuropathy   Neuropathy    Osteoporosis    10/2012   Otosclerosis    Pigmented purpura (HCC)    RA (rheumatoid arthritis) (HCC) 2000   S/P cardiac cath 01/2012   Normal coronaries and normal LV function.   Sjoegren syndrome    Past Surgical History:  Procedure Laterality Date   back ablation  03/2020   back ablation  10/09/2020   BREAST LUMPECTOMY  1995   CARPAL TUNNEL RELEASE  2000   CATARACT EXTRACTION Right 09-05-12   CATARACT EXTRACTION Left 3/15   CESAREAN SECTION  1987   COLONOSCOPY     EYE SURGERY     diabetic retinopathy    HYSTEROSCOPY  02/2001   and D & C   LYMPH NODE DISSECTION  2002   TOE SURGERY  2000   bone removed   Patient Active Problem List   Diagnosis Date Noted   Posterior vitreous detachment of left eye 09/15/2021   Dry eye syndrome, bilateral 09/15/2021   Scoliosis 07/24/2020   Spondylosis without myelopathy or radiculopathy, lumbar region 07/24/2020   Chronic bilateral low back pain without sciatica 07/24/2020   Posterior vitreous detachment of  right eye 03/18/2020   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 determined by examination associated with type 1 diabetes mellitus (HCC) 03/18/2020   Stable treated proliferative diabetic retinopathy of right eye determined by examination associated with type 2 diabetes mellitus (HCC) 03/18/2020   History of vitrectomy 03/18/2020   Complicated UTI (urinary tract infection) 12/13/2018   Failure of outpatient treatment 12/11/2018   Acute metabolic encephalopathy 12/11/2018   Fever 12/10/2018   At high risk for breast cancer 05/09/2018   Eosinophilia 01/31/2017   Iritis 01/04/2017   History of joint swelling 08/24/2016   Osteoarthritis of knees, bilateral 08/24/2016   History of migraine 08/24/2016   History of depression 08/24/2016   History of osteoporosis 08/24/2016   DDD (degenerative disc disease), lumbar 08/24/2016   High risk medication use 08/24/2016   Trigger finger, left middle finger 08/24/2016   Trigger finger, right ring finger 08/24/2016   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   Increased risk of breast cancer 07/06/2015   IDDM (insulin dependent diabetes mellitus) 07/06/2015   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 without organ or systems involvement (HCC) 07/06/2015  Sjogren's syndrome (HCC) 07/06/2015   Genetic testing 05/12/2015   Family history of breast cancer 04/15/2015   Fibrocystic breast disease 10/11/2012   GERD 10/01/2009   Chest pain with moderate risk for cardiac etiology 09/02/2009   DYSPHAGIA 09/02/2009   ANXIETY 08/15/2009   ULCER-GASTRIC 08/15/2009   CONSTIPATION 08/15/2009   SLOW TRANSIT CONSTIPATION 08/15/2009   FLATULENCE ERUCTATION AND GAS PAIN 08/15/2009    PCP: Adrian Prince, MD  REFERRING PROVIDER: Rosendo Gros, MD   REFERRING DIAG:  M50.30 (ICD-10-CM) - DDD (degenerative disc disease), cervical  [phone] (ICD-10-CM) - Trapezius muscle spasm   THERAPY DIAG:   Cervicalgia  Cramp and spasm  Rationale for Evaluation and Treatment: Rehabilitation  ONSET DATE: chronic   SUBJECTIVE:                                                                                                                                                                                                         SUBJECTIVE STATEMENT: My headache is much better, but my neck feels very tight, I just cried and cried yesterday because of my dog.  Hand dominance: Right  PERTINENT HISTORY:  Chronic low back pain with radiculopathy, myofascial trigger point injections every 6 weeks, back ablation, scoliosis, osteoporosis, RA, Type 1 DM, neuropathy, Sjogren's syndrome   PAIN:  Are you having pain? Yes: NPRS scale: 8/10 Pain location: R Upper trap region, R side neck, severe Headache Pain description: tight, like a brick, headache, into her eye Aggravating factors: not sure, always tight Relieving factors: tries all the stretches, in past PT and TrDN has helped significantly  PRECAUTIONS: None  RED FLAGS: None     WEIGHT BEARING RESTRICTIONS: No  FALLS:  Has patient fallen in last 6 months? No  LIVING ENVIRONMENT: Lives with: lives with their spouse Lives in: House/apartment Stairs: No Has following equipment at home: None  OCCUPATION: retired  PLOF: Independent  PATIENT GOALS: get rid of headache and decrease neck pain  NEXT MD VISIT:   OBJECTIVE:   DIAGNOSTIC FINDINGS:  MRI Cervical Spine 01/12/2022  IMPRESSION: 1. C4-C5 mild spinal canal stenosis and mild right neural foraminal narrowing. 2. C6-C7 moderate right and mild left neural foraminal narrowing. 3. C3-C4 mild right neural foraminal narrowing. MRI Lumbar spine 01/12/2022  IMPRESSION: 1. L1-L2 mild right neural foraminal narrowing. Narrowing of the right lateral recess at this level could affect the descending right L2 nerve roots. 2. Multilevel facet arthropathy, which can be a cause of back  pain.  PATIENT SURVEYS:  NDI 34/50= 68%  COGNITION: Overall cognitive status: Within  functional limits for tasks assessed  SENSATION: Severe neuropathy bil feet, mild neuropathy in hands, sensations intact to light touch in hands, denies numbness or tingling.  POSTURE: rounded shoulders and decreased lumbar lordosis  PALPATION: Tightness/tenderness throughout cervical paraspinals, R UT/levator scapulae   CERVICAL ROM:   Active ROM A/PROM (deg) eval  Flexion 35p!  Extension 30  Right lateral flexion 30p!  Left lateral flexion 30p!  Right rotation 50p!  Left rotation 60   (Blank rows = not tested)  UPPER EXTREMITY ROM: full symmetric AROM for both shoulders, reports history of RA and chronic L shoulder pain  UPPER EXTREMITY MMT:  no deficits in bil UE  CERVICAL SPECIAL TESTS:  Spurling's test: Negative and Sharp pursor's test: Negative  TODAY'S TREATMENT:                                                                                                                              DATE:   01/20/2023 Therapeutic Exercise: to improve strength and mobility.  Demo, verbal and tactile cues throughout for technique. Chin tucks Supine chin tuck and roll Review of open books Pec stretch - try foam roller to help with stretch Manual Therapy: to decrease muscle spasm and pain and improve mobility STM/TPR to cervical paraspinals, masseter, SCM, UPA mobs to cervical spine, suboccipital release, STM/TPR to bil UT & levator, skilled palpation and monitoring during dry needling. Trigger Point Dry-Needling  Treatment instructions: Expect mild to moderate muscle soreness. S/S of pneumothorax if dry needled over a lung field, and to seek immediate medical attention should they occur. Patient verbalized understanding of these instructions and education. Patient Consent Given: Yes Education handout provided: Previously provided Muscles treated: bil UT, L/S, bil cervical multifidi  C3-C6 Electrical stimulation performed: No Parameters: N/A Treatment response/outcome: Twitch Response Elicited and Palpable Increase in Muscle Length  01/13/23 Manual Therapy: to decrease muscle spasm and pain and improve mobility STM/TPR to cervical paraspinals, masseter, UPA mobs to cervical spine, suboccipital release, PA mobs thoracic spine grade 2-3, STM/TPR to bil UT & levator, skilled palpation and monitoring during dry needling. Trigger Point Dry-Needling  Treatment instructions: Expect mild to moderate muscle soreness. S/S of pneumothorax if dry needled over a lung field, and to seek immediate medical attention should they occur. Patient verbalized understanding of these instructions and education. Patient Consent Given: Yes Education handout provided: Previously provided Muscles treated: bil UT, L/S, bil cervical multifidi C3-C6 Electrical stimulation performed: No Parameters: N/A Treatment response/outcome: Twitch Response Elicited and Palpable Increase in Muscle Length  01/11/23 Manual Therapy: to decrease muscle spasm and pain and improve mobility STM/TPR to cervical paraspinals, UPA mobs to cervical spine, suboccipital release, PA mobs thoracic spine grade 2-3, STM/TPR to bil UT & levator, skilled palpation and monitoring during dry needling. Trigger Point Dry-Needling  Treatment instructions: Expect mild to moderate muscle soreness. S/S of pneumothorax if dry needled over a lung field, and to seek immediate medical attention should they occur.  Patient verbalized understanding of these instructions and education. Patient Consent Given: Yes Education handout provided: Previously provided Muscles treated: bil UT, L/S, bil cervical multifidi C3-T1 Electrical stimulation performed: No Parameters: N/A Treatment response/outcome: Twitch Response Elicited and Palpable Increase in Muscle Length  PATIENT EDUCATION:  Education details:  HEP  Person educated: Patient Education method:  Explanation Education comprehension: verbalized understanding  HOME EXERCISE PROGRAM: Access Code: [email or web address]   Access Code: [email or web address] URL: [email or web address] Date: 01/20/2023 Prepared by: Harrie Foreman  Exercises - Supine Chin Tuck on Pillow  - 1 x daily - 7 x weekly - 3 sets - 10 reps - Supine Cervical Rotation AROM on Flat Ball  - 1 x daily - 7 x weekly - 3 sets - 10 reps   ASSESSMENT:  CLINICAL IMPRESSION: Becky [name]  continues to reports no headache, neck pain is still improving but neck still tight.  Reviewed past HEP and added cervical strengthening exercises.  Continued to focus on manual therapy, with TrDN to relieve muscle spasm followed my joint mobilization, as well as working on jaw musculature and SCM.  Reported decreased pain/tightness after interventions.  [name] continues to demonstrate potential for improvement and would benefit from continued skilled therapy to address impairments.      OBJECTIVE IMPAIRMENTS: decreased ROM, hypomobility, increased fascial restrictions, increased muscle spasms, postural dysfunction, and pain.   ACTIVITY LIMITATIONS: carrying, bending, and sleeping  PARTICIPATION LIMITATIONS: interpersonal relationship and driving  PERSONAL FACTORS: Age, Past/current experiences, Time since onset of injury/illness/exacerbation, and 3+ comorbidities: Chronic low back pain with radiculopathy, myofascial trigger point injections every 6 weeks, back ablation, scoliosis, osteoporosis, RA, Type 1 DM, neuropathy, Sjogren's syndrome  are also affecting patient's functional outcome.   REHAB POTENTIAL: Good  CLINICAL DECISION MAKING: Evolving/moderate complexity  EVALUATION COMPLEXITY: Moderate   GOALS: Goals reviewed with patient? Yes  SHORT TERM GOALS: Target date: 01/19/2023   Patient will be independent with initial HEP.  Baseline: needs review Goal status: MET 01/20/23  LONG TERM GOALS: Target date: 02/16/2023    Patient will be independent with advanced/ongoing HEP to improve outcomes and carryover.  Baseline:  Goal status: IN PROGRESS  2.  Patient will report 75% improvement in neck pain to improve QOL.  Baseline:  Goal status: IN PROGRESS  3.  Patient will demonstrate full pain free cervical ROM for safety with driving.  Baseline: see objective Goal status: IN PROGRESS  4.  Patient will report 7 points or more improvement on NDI to demonstrate improved functional ability.  Baseline: 34/50 Goal status: IN PROGRESS  5.  Patient will report 75% improvement in frequency and intensity of headaches. Baseline:  Goal status: IN PROGRESS 01/20/23- headache has improved significantly    PLAN:  PT FREQUENCY: 2x/week for 3 weeks, then 1x/week  PT DURATION: 12 weeks  PLANNED INTERVENTIONS: Therapeutic exercises, Therapeutic activity, Neuromuscular re-education, Balance training, Gait training, Patient/Family education, Self Care, Joint mobilization, Dry Needling, Electrical stimulation, Spinal manipulation, Spinal mobilization, Cryotherapy, Moist heat, Taping, Traction, Ultrasound, Manual therapy, and Re-evaluation  PLAN FOR NEXT SESSION: continue to progress cervical strengthening, posterior shoulder strengthening as well as stretches, continue manual therapy and TrDN    Jena Gauss, PT 01/20/2023, 3:33 PM

## 2023-01-24 ENCOUNTER — Ambulatory Visit: Payer: Medicare Other | Admitting: Hematology and Oncology

## 2023-01-24 DIAGNOSIS — E109 Type 1 diabetes mellitus without complications: Secondary | ICD-10-CM | POA: Diagnosis not present

## 2023-01-24 NOTE — Telephone Encounter (Signed)
Mychart msg returned unread.  Spoke w/ pt and advised her of results/recommendations.  She voiced understanding and appreciation for the call.  Routing to provider for final review.

## 2023-01-25 ENCOUNTER — Encounter: Payer: Medicare Other | Admitting: Physical Therapy

## 2023-01-27 ENCOUNTER — Ambulatory Visit: Payer: Medicare Other | Admitting: Physical Therapy

## 2023-01-31 DIAGNOSIS — E109 Type 1 diabetes mellitus without complications: Secondary | ICD-10-CM | POA: Diagnosis not present

## 2023-02-01 ENCOUNTER — Encounter: Payer: Self-pay | Admitting: Physical Therapy

## 2023-02-01 ENCOUNTER — Ambulatory Visit: Payer: Medicare Other | Admitting: Physical Therapy

## 2023-02-01 DIAGNOSIS — R252 Cramp and spasm: Secondary | ICD-10-CM

## 2023-02-01 DIAGNOSIS — H8111 Benign paroxysmal vertigo, right ear: Secondary | ICD-10-CM | POA: Diagnosis not present

## 2023-02-01 DIAGNOSIS — M542 Cervicalgia: Secondary | ICD-10-CM

## 2023-02-01 NOTE — Therapy (Signed)
OUTPATIENT PHYSICAL THERAPY TREATMENT   Patient Name: Becky [name] MRN: [id number] DOB:04-26-1956, 67 y.o., female Today's Date: 02/01/2023  END OF SESSION:  PT End of Session - 02/01/23 1316     Visit Number 5    Date for PT Re-Evaluation 03/30/23    Authorization Type [employer]    Progress Note Due on Visit 10    PT Start Time 1316    PT Stop Time 1400    PT Time Calculation (min) 44 min    Activity Tolerance Patient tolerated treatment well    Behavior During Therapy WFL for tasks assessed/performed             Past Medical History:  Diagnosis Date   Abnormal ECG    Anemia    Anxiety    Cataract    . Cateracts bil eyes removed   Eczema    GERD (gastroesophageal reflux disease)    Heart murmur    History of COVID-19 06/14/2020   Hypercholesterolemia    IDDM (insulin dependent diabetes mellitus) 1969   Migraine headache    Neuromuscular disorder (HCC)    bil legs & feet - neuropathy   Neuropathy    Osteoporosis    10/2012   Otosclerosis    Pigmented purpura (HCC)    RA (rheumatoid arthritis) (HCC) 2000   S/P cardiac cath 01/2012   Normal coronaries and normal LV function.   Sjoegren syndrome    Past Surgical History:  Procedure Laterality Date   back ablation  03/2020   back ablation  10/09/2020   BREAST LUMPECTOMY  1995   CARPAL TUNNEL RELEASE  2000   CATARACT EXTRACTION Right 09-05-12   CATARACT EXTRACTION Left 3/15   CESAREAN SECTION  1987   COLONOSCOPY     EYE SURGERY     diabetic retinopathy    HYSTEROSCOPY  02/2001   and D & C   LYMPH NODE DISSECTION  2002   TOE SURGERY  2000   bone removed   Patient Active Problem List   Diagnosis Date Noted   Posterior vitreous detachment of left eye 09/15/2021   Dry eye syndrome, bilateral 09/15/2021   Scoliosis 07/24/2020   Spondylosis without myelopathy or radiculopathy, lumbar region 07/24/2020   Chronic bilateral low back pain without sciatica 07/24/2020   Posterior vitreous detachment  of right eye 03/18/2020   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 determined by examination associated with type 1 diabetes mellitus (HCC) 03/18/2020   Stable treated proliferative diabetic retinopathy of right eye determined by examination associated with type 2 diabetes mellitus (HCC) 03/18/2020   History of vitrectomy 03/18/2020   Complicated UTI (urinary tract infection) 12/13/2018   Failure of outpatient treatment 12/11/2018   Acute metabolic encephalopathy 12/11/2018   Fever 12/10/2018   At high risk for breast cancer 05/09/2018   Eosinophilia 01/31/2017   Iritis 01/04/2017   History of joint swelling 08/24/2016   Osteoarthritis of knees, bilateral 08/24/2016   History of migraine 08/24/2016   History of depression 08/24/2016   History of osteoporosis 08/24/2016   DDD (degenerative disc disease), lumbar 08/24/2016   High risk medication use 08/24/2016   Trigger finger, left middle finger 08/24/2016   Trigger finger, right ring finger 08/24/2016   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   Increased risk of breast cancer 07/06/2015   IDDM (insulin dependent diabetes mellitus) 07/06/2015   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 without organ or systems involvement (HCC) 07/06/2015  Sjogren's syndrome (HCC) 07/06/2015   Genetic testing 05/12/2015   Family history of breast cancer 04/15/2015   Fibrocystic breast disease 10/11/2012   GERD 10/01/2009   Chest pain with moderate risk for cardiac etiology 09/02/2009   DYSPHAGIA 09/02/2009   ANXIETY 08/15/2009   ULCER-GASTRIC 08/15/2009   CONSTIPATION 08/15/2009   SLOW TRANSIT CONSTIPATION 08/15/2009   FLATULENCE ERUCTATION AND GAS PAIN 08/15/2009    PCP: Adrian Prince, MD  REFERRING PROVIDER: Rosendo Gros, MD   REFERRING DIAG:  M50.30 (ICD-10-CM) - DDD (degenerative disc disease), cervical  [phone] (ICD-10-CM) - Trapezius muscle spasm   THERAPY DIAG:   Cervicalgia  Cramp and spasm  BPPV (benign paroxysmal positional vertigo), right  Rationale for Evaluation and Treatment: Rehabilitation  ONSET DATE: chronic   SUBJECTIVE:                                                                                                                                                                                                         SUBJECTIVE STATEMENT: Patient reports she has continued to be dizzy since last session.  She has had several episodes of dizziness bending over and also rolling over in bed.  Neck hurts too.  Hand dominance: Right  PERTINENT HISTORY:  Chronic low back pain with radiculopathy, myofascial trigger point injections every 6 weeks, back ablation, scoliosis, osteoporosis, RA, Type 1 DM, neuropathy, Sjogren's syndrome   PAIN:  Are you having pain? Yes: NPRS scale: 6/10 Pain location: R Upper trap region, R side neck, severe Headache Pain description: tight, like a brick, headache, into her eye Aggravating factors: not sure, always tight Relieving factors: tries all the stretches, in past PT and TrDN has helped significantly  PRECAUTIONS: None  RED FLAGS: None     WEIGHT BEARING RESTRICTIONS: No  FALLS:  Has patient fallen in last 6 months? No  LIVING ENVIRONMENT: Lives with: lives with their spouse Lives in: House/apartment Stairs: No Has following equipment at home: None  OCCUPATION: retired  PLOF: Independent  PATIENT GOALS: get rid of headache and decrease neck pain  NEXT MD VISIT:   OBJECTIVE:   DIAGNOSTIC FINDINGS:  MRI Cervical Spine 01/12/2022  IMPRESSION: 1. C4-C5 mild spinal canal stenosis and mild right neural foraminal narrowing. 2. C6-C7 moderate right and mild left neural foraminal narrowing. 3. C3-C4 mild right neural foraminal narrowing. MRI Lumbar spine 01/12/2022  IMPRESSION: 1. L1-L2 mild right neural foraminal narrowing. Narrowing of the right lateral recess at this level could  affect the descending right L2 nerve roots. 2. Multilevel facet arthropathy, which can be a  cause of back pain.  PATIENT SURVEYS:  NDI 34/50= 68%  COGNITION: Overall cognitive status: Within functional limits for tasks assessed  SENSATION: Severe neuropathy bil feet, mild neuropathy in hands, sensations intact to light touch in hands, denies numbness or tingling.  POSTURE: rounded shoulders and decreased lumbar lordosis  PALPATION: Tightness/tenderness throughout cervical paraspinals, R UT/levator scapulae   CERVICAL ROM:   Active ROM A/PROM (deg) eval  Flexion 35p!  Extension 30  Right lateral flexion 30p!  Left lateral flexion 30p!  Right rotation 50p!  Left rotation 60   (Blank rows = not tested)  UPPER EXTREMITY ROM: full symmetric AROM for both shoulders, reports history of RA and chronic L shoulder pain  UPPER EXTREMITY MMT:  no deficits in bil UE  CERVICAL SPECIAL TESTS:  Spurling's test: Negative and Sharp pursor's test: Negative  TODAY'S TREATMENT:                                                                                                                              DATE:  02/01/2023  Canalith Repositioning: R epley Maneuver performed after positive R dix-hallpike, retest of R dix-hallpike with complete resolution of symptoms.   Manual Therapy: to decrease muscle spasm and pain and improve mobility UPA mobs cervical spine and STM/TPR to cervical paraspinals and bil UT and L/S in prone, skilled palpation and monitoring during dry needling. Trigger Point Dry-Needling  Treatment instructions: Expect mild to moderate muscle soreness. S/S of pneumothorax if dry needled over a lung field, and to seek immediate medical attention should they occur. Patient verbalized understanding of these instructions and education. Patient Consent Given: Yes Education handout provided: Previously provided Muscles treated: bil UT, L/S, bil cervical multifidi C3-C6 Electrical  stimulation performed: No Parameters: N/A Treatment response/outcome: Twitch Response Elicited and Palpable Increase in Muscle Length  01/20/2023 Therapeutic Exercise: to improve strength and mobility.  Demo, verbal and tactile cues throughout for technique. Chin tucks Supine chin tuck and roll Review of open books Pec stretch - try foam roller to help with stretch Manual Therapy: to decrease muscle spasm and pain and improve mobility STM/TPR to cervical paraspinals, masseter, SCM, UPA mobs to cervical spine, suboccipital release, STM/TPR to bil UT & levator, skilled palpation and monitoring during dry needling. Trigger Point Dry-Needling  Treatment instructions: Expect mild to moderate muscle soreness. S/S of pneumothorax if dry needled over a lung field, and to seek immediate medical attention should they occur. Patient verbalized understanding of these instructions and education. Patient Consent Given: Yes Education handout provided: Previously provided Muscles treated: bil UT, L/S, bil cervical multifidi C3-C6 Electrical stimulation performed: No Parameters: N/A Treatment response/outcome: Twitch Response Elicited and Palpable Increase in Muscle Length  01/13/23 Manual Therapy: to decrease muscle spasm and pain and improve mobility STM/TPR to cervical paraspinals, masseter, UPA mobs to cervical spine, suboccipital release, PA mobs thoracic spine grade 2-3, STM/TPR to bil UT & levator, skilled palpation and monitoring during dry needling.  Trigger Point Dry-Needling  Treatment instructions: Expect mild to moderate muscle soreness. S/S of pneumothorax if dry needled over a lung field, and to seek immediate medical attention should they occur. Patient verbalized understanding of these instructions and education. Patient Consent Given: Yes Education handout provided: Previously provided Muscles treated: bil UT, L/S, bil cervical multifidi C3-C6 Electrical stimulation performed: No Parameters:  N/A Treatment response/outcome: Twitch Response Elicited and Palpable Increase in Muscle Length   PATIENT EDUCATION:  Education details:  BPPV precautions Person educated: Patient Education method: Explanation Education comprehension: verbalized understanding  HOME EXERCISE PROGRAM: Access Code: [email or web address]   Access Code: [email or web address] URL: [email or web address] Date: 01/20/2023 Prepared by: Harrie Foreman  Exercises - Supine Chin Tuck on Pillow  - 1 x daily - 7 x weekly - 3 sets - 10 reps - Supine Cervical Rotation AROM on Flat Ball  - 1 x daily - 7 x weekly - 3 sets - 10 reps   ASSESSMENT:  CLINICAL IMPRESSION: Becky [name]  had reported brief dizziness sitting up after last session, and reported she continued to have episodes of dizziness then with positional head movements like bending over or rolling over in bed.  Tested with R Dix hallpike and immediately positive with upward rotatory nystagmus ~ 10 sec, positive for R posterior canalithiasis, so treated with R epley, with complete resolution of symptoms.  Instructed to try to keep head upright for remainder of today, and sleep on back if possible, report back to PT if symptoms reoocur.  Treated neck in prone with improved neck pain.     Becky [name] continues to demonstrate potential for improvement and would benefit from continued skilled therapy to address impairments.      OBJECTIVE IMPAIRMENTS: decreased ROM, hypomobility, increased fascial restrictions, increased muscle spasms, postural dysfunction, and pain.   ACTIVITY LIMITATIONS: carrying, bending, and sleeping  PARTICIPATION LIMITATIONS: interpersonal relationship and driving  PERSONAL FACTORS: Age, Past/current experiences, Time since onset of injury/illness/exacerbation, and 3+ comorbidities: Chronic low back pain with radiculopathy, myofascial trigger point injections every 6 weeks, back ablation, scoliosis, osteoporosis, RA, Type 1 DM,  neuropathy, Sjogren's syndrome  are also affecting patient's functional outcome.   REHAB POTENTIAL: Good  CLINICAL DECISION MAKING: Evolving/moderate complexity  EVALUATION COMPLEXITY: Moderate   GOALS: Goals reviewed with patient? Yes  SHORT TERM GOALS: Target date: 01/19/2023   Patient will be independent with initial HEP.  Baseline: needs review Goal status: MET 01/20/23  LONG TERM GOALS: Target date: 02/16/2023   Patient will be independent with advanced/ongoing HEP to improve outcomes and carryover.  Baseline:  Goal status: IN PROGRESS  2.  Patient will report 75% improvement in neck pain to improve QOL.  Baseline:  Goal status: IN PROGRESS  3.  Patient will demonstrate full pain free cervical ROM for safety with driving.  Baseline: see objective Goal status: IN PROGRESS  4.  Patient will report 7 points or more improvement on NDI to demonstrate improved functional ability.  Baseline: 34/50 Goal status: IN PROGRESS  5.  Patient will report 75% improvement in frequency and intensity of headaches. Baseline:  Goal status: IN PROGRESS 01/20/23- headache has improved significantly    PLAN:  PT FREQUENCY: 2x/week for 3 weeks, then 1x/week  PT DURATION: 12 weeks  PLANNED INTERVENTIONS: Therapeutic exercises, Therapeutic activity, Neuromuscular re-education, Balance training, Gait training, Patient/Family education, Self Care, Joint mobilization, Dry Needling, Electrical stimulation, Spinal manipulation, Spinal mobilization, Cryotherapy, Moist heat, Taping, Traction, Ultrasound, Manual therapy, and Re-evaluation  PLAN FOR NEXT SESSION:  continue to progress cervical strengthening, posterior shoulder strengthening as well as stretches, continue manual therapy and TrDN    Jena Gauss, PT, DPT  02/01/2023, 3:26 PM

## 2023-02-03 ENCOUNTER — Encounter: Payer: Self-pay | Admitting: Physical Therapy

## 2023-02-03 ENCOUNTER — Ambulatory Visit: Payer: Medicare Other | Admitting: Physical Therapy

## 2023-02-03 DIAGNOSIS — M542 Cervicalgia: Secondary | ICD-10-CM

## 2023-02-03 DIAGNOSIS — R252 Cramp and spasm: Secondary | ICD-10-CM

## 2023-02-03 DIAGNOSIS — H8111 Benign paroxysmal vertigo, right ear: Secondary | ICD-10-CM | POA: Diagnosis not present

## 2023-02-03 NOTE — Therapy (Signed)
OUTPATIENT PHYSICAL THERAPY TREATMENT   Patient Name: Becky [name] MRN: [id number] DOB:[birth year], 67 y.o., female Today's Date: 02/03/2023  END OF SESSION:  PT End of Session - 02/03/23 1148     Visit Number 6    Date for PT Re-Evaluation 03/30/23    Authorization Type [employer]    Progress Note Due on Visit 10    PT Start Time 1111    PT Stop Time 1141    PT Time Calculation (min) 30 min    Activity Tolerance Patient tolerated treatment well    Behavior During Therapy WFL for tasks assessed/performed             Past Medical History:  Diagnosis Date   Abnormal ECG    Anemia    Anxiety    Cataract    . Cateracts bil eyes removed   Eczema    GERD (gastroesophageal reflux disease)    Heart murmur    History of COVID-19 06/14/2020   Hypercholesterolemia    IDDM (insulin dependent diabetes mellitus) 1969   Migraine headache    Neuromuscular disorder (HCC)    bil legs & feet - neuropathy   Neuropathy    Osteoporosis    10/2012   Otosclerosis    Pigmented purpura (HCC)    RA (rheumatoid arthritis) (HCC) 2000   S/P cardiac cath 01/2012   Normal coronaries and normal LV function.   Sjoegren syndrome    Past Surgical History:  Procedure Laterality Date   back ablation  03/2020   back ablation  10/09/2020   BREAST LUMPECTOMY  1995   CARPAL TUNNEL RELEASE  2000   CATARACT EXTRACTION Right 09-05-12   CATARACT EXTRACTION Left 3/15   CESAREAN SECTION  1987   COLONOSCOPY     EYE SURGERY     diabetic retinopathy    HYSTEROSCOPY  02/2001   and D & C   LYMPH NODE DISSECTION  2002   TOE SURGERY  2000   bone removed   Patient Active Problem List   Diagnosis Date Noted   Posterior vitreous detachment of left eye 09/15/2021   Dry eye syndrome, bilateral 09/15/2021   Scoliosis 07/24/2020   Spondylosis without myelopathy or radiculopathy, lumbar region 07/24/2020   Chronic bilateral low back pain without sciatica 07/24/2020   Posterior vitreous detachment  of right eye 03/18/2020   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 determined by examination associated with type 1 diabetes mellitus (HCC) 03/18/2020   Stable treated proliferative diabetic retinopathy of right eye determined by examination associated with type 2 diabetes mellitus (HCC) 03/18/2020   History of vitrectomy 03/18/2020   Complicated UTI (urinary tract infection) 12/13/2018   Failure of outpatient treatment 12/11/2018   Acute metabolic encephalopathy 12/11/2018   Fever 12/10/2018   At high risk for breast cancer 05/09/2018   Eosinophilia 01/31/2017   Iritis 01/04/2017   History of joint swelling 08/24/2016   Osteoarthritis of knees, bilateral 08/24/2016   History of migraine 08/24/2016   History of depression 08/24/2016   History of osteoporosis 08/24/2016   DDD (degenerative disc disease), lumbar 08/24/2016   High risk medication use 08/24/2016   Trigger finger, left middle finger 08/24/2016   Trigger finger, right ring finger 08/24/2016   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   Increased risk of breast cancer 07/06/2015   IDDM (insulin dependent diabetes mellitus) 07/06/2015   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 without organ or systems involvement (HCC) 07/06/2015  Sjogren's syndrome (HCC) 07/06/2015   Genetic testing 05/12/2015   Family history of breast cancer 04/15/2015   Fibrocystic breast disease 10/11/2012   GERD 10/01/2009   Chest pain with moderate risk for cardiac etiology 09/02/2009   DYSPHAGIA 09/02/2009   ANXIETY 08/15/2009   ULCER-GASTRIC 08/15/2009   CONSTIPATION 08/15/2009   SLOW TRANSIT CONSTIPATION 08/15/2009   FLATULENCE ERUCTATION AND GAS PAIN 08/15/2009    PCP: Adrian Prince, MD  REFERRING PROVIDER: Rosendo Gros, MD   REFERRING DIAG:  M50.30 (ICD-10-CM) - DDD (degenerative disc disease), cervical  [phone] (ICD-10-CM) - Trapezius muscle spasm   THERAPY DIAG:   Cervicalgia  Cramp and spasm  Rationale for Evaluation and Treatment: Rehabilitation  ONSET DATE: chronic   SUBJECTIVE:                                                                                                                                                                                                         SUBJECTIVE STATEMENT: Has not had any dizziness since last session.  Neck is better, still hurts on R side mostly.   Has been consistent with HEP.  Hand dominance: Right  PERTINENT HISTORY:  Chronic low back pain with radiculopathy, myofascial trigger point injections every 6 weeks, back ablation, scoliosis, osteoporosis, RA, Type 1 DM, neuropathy, Sjogren's syndrome   PAIN:  Are you having pain? Yes: NPRS scale: 4/10 Pain location: R Upper trap region, R side neck Pain description: tight, like a brick, headache, into her eye Aggravating factors: not sure, always tight Relieving factors: tries all the stretches, in past PT and TrDN has helped significantly  PRECAUTIONS: None  RED FLAGS: None     WEIGHT BEARING RESTRICTIONS: No  FALLS:  Has patient fallen in last 6 months? No  LIVING ENVIRONMENT: Lives with: lives with their spouse Lives in: House/apartment Stairs: No Has following equipment at home: None  OCCUPATION: retired  PLOF: Independent  PATIENT GOALS: get rid of headache and decrease neck pain  NEXT MD VISIT:   OBJECTIVE:   DIAGNOSTIC FINDINGS:  MRI Cervical Spine 01/12/2022  IMPRESSION: 1. C4-C5 mild spinal canal stenosis and mild right neural foraminal narrowing. 2. C6-C7 moderate right and mild left neural foraminal narrowing. 3. C3-C4 mild right neural foraminal narrowing. MRI Lumbar spine 01/12/2022  IMPRESSION: 1. L1-L2 mild right neural foraminal narrowing. Narrowing of the right lateral recess at this level could affect the descending right L2 nerve roots. 2. Multilevel facet arthropathy, which can be a cause of back  pain.  PATIENT SURVEYS:  NDI 34/50= 68%  COGNITION: Overall cognitive  status: Within functional limits for tasks assessed  SENSATION: Severe neuropathy bil feet, mild neuropathy in hands, sensations intact to light touch in hands, denies numbness or tingling.  POSTURE: rounded shoulders and decreased lumbar lordosis  PALPATION: Tightness/tenderness throughout cervical paraspinals, R UT/levator scapulae   CERVICAL ROM:   Active ROM A/PROM (deg) eval  Flexion 35p!  Extension 30  Right lateral flexion 30p!  Left lateral flexion 30p!  Right rotation 50p!  Left rotation 60   (Blank rows = not tested)  UPPER EXTREMITY ROM: full symmetric AROM for both shoulders, reports history of RA and chronic L shoulder pain  UPPER EXTREMITY MMT:  no deficits in bil UE  CERVICAL SPECIAL TESTS:  Spurling's test: Negative and Sharp pursor's test: Negative  TODAY'S TREATMENT:                                                                                                                              DATE:   02/03/23 Manual Therapy: to decrease muscle spasm and pain and improve mobility STM/TPR to cervical paraspinals, R UPA mobs to cervical spine, STM/TPR to R UT levator, skilled palpation and monitoring during dry needling. Trigger Point Dry-Needling  Treatment instructions: Expect mild to moderate muscle soreness. S/S of pneumothorax if dry needled over a lung field, and to seek immediate medical attention should they occur. Patient verbalized understanding of these instructions and education. Patient Consent Given: Yes Education handout provided: Previously provided Muscles treated: R UT, L/S, R cervical multifidi C3-C6 Electrical stimulation performed: No Parameters: N/A Treatment response/outcome: Twitch Response Elicited and Palpable Increase in Muscle Length   02/01/2023  Canalith Repositioning: R epley Maneuver performed after positive R dix-hallpike, retest of R dix-hallpike with  complete resolution of symptoms.   Manual Therapy: to decrease muscle spasm and pain and improve mobility UPA mobs cervical spine and STM/TPR to cervical paraspinals and bil UT and L/S in prone, skilled palpation and monitoring during dry needling. Trigger Point Dry-Needling  Treatment instructions: Expect mild to moderate muscle soreness. S/S of pneumothorax if dry needled over a lung field, and to seek immediate medical attention should they occur. Patient verbalized understanding of these instructions and education. Patient Consent Given: Yes Education handout provided: Previously provided Muscles treated: bil UT, L/S, bil cervical multifidi C3-C6 Electrical stimulation performed: No Parameters: N/A Treatment response/outcome: Twitch Response Elicited and Palpable Increase in Muscle Length  01/20/2023 Therapeutic Exercise: to improve strength and mobility.  Demo, verbal and tactile cues throughout for technique. Chin tucks Supine chin tuck and roll Review of open books Pec stretch - try foam roller to help with stretch Manual Therapy: to decrease muscle spasm and pain and improve mobility STM/TPR to cervical paraspinals, masseter, SCM, UPA mobs to cervical spine, suboccipital release, STM/TPR to bil UT & levator, skilled palpation and monitoring during dry needling. Trigger Point Dry-Needling  Treatment instructions: Expect mild to moderate muscle soreness. S/S of pneumothorax if dry needled over a lung field,  and to seek immediate medical attention should they occur. Patient verbalized understanding of these instructions and education. Patient Consent Given: Yes Education handout provided: Previously provided Muscles treated: bil UT, L/S, bil cervical multifidi C3-C6 Electrical stimulation performed: No Parameters: N/A Treatment response/outcome: Twitch Response Elicited and Palpable Increase in Muscle Length   PATIENT EDUCATION:  Education details:  continue HEP Person educated:  Patient Education method: Explanation Education comprehension: verbalized understanding  HOME EXERCISE PROGRAM: Access Code: [email or web address]   Access Code: [email or web address] URL: [email or web address] Date: 01/20/2023 Prepared by: Harrie Foreman  Exercises - Supine Chin Tuck on Pillow  - 1 x daily - 7 x weekly - 3 sets - 10 reps - Supine Cervical Rotation AROM on Flat Ball  - 1 x daily - 7 x weekly - 3 sets - 10 reps   ASSESSMENT:  CLINICAL IMPRESSION: [name] reports symptoms primarily on R side neck today, so focused on manual therapy on this region with good response.  Did not retest for BPPV as symptoms have resolved.    [name] continues to demonstrate potential for improvement and would benefit from continued skilled therapy to address impairments.      OBJECTIVE IMPAIRMENTS: decreased ROM, hypomobility, increased fascial restrictions, increased muscle spasms, postural dysfunction, and pain.   ACTIVITY LIMITATIONS: carrying, bending, and sleeping  PARTICIPATION LIMITATIONS: interpersonal relationship and driving  PERSONAL FACTORS: Age, Past/current experiences, Time since onset of injury/illness/exacerbation, and 3+ comorbidities: Chronic low back pain with radiculopathy, myofascial trigger point injections every 6 weeks, back ablation, scoliosis, osteoporosis, RA, Type 1 DM, neuropathy, Sjogren's syndrome  are also affecting patient's functional outcome.   REHAB POTENTIAL: Good  CLINICAL DECISION MAKING: Evolving/moderate complexity  EVALUATION COMPLEXITY: Moderate   GOALS: Goals reviewed with patient? Yes  SHORT TERM GOALS: Target date: 01/19/2023   Patient will be independent with initial HEP.  Baseline: needs review Goal status: MET 01/20/23  LONG TERM GOALS: Target date: 02/16/2023   Patient will be independent with advanced/ongoing HEP to improve outcomes and carryover.  Baseline:  Goal status: IN PROGRESS  2.  Patient will report 75%  improvement in neck pain to improve QOL.  Baseline:  Goal status: IN PROGRESS  3.  Patient will demonstrate full pain free cervical ROM for safety with driving.  Baseline: see objective Goal status: IN PROGRESS  4.  Patient will report 7 points or more improvement on NDI to demonstrate improved functional ability.  Baseline: 34/50 Goal status: IN PROGRESS  5.  Patient will report 75% improvement in frequency and intensity of headaches. Baseline:  Goal status: IN PROGRESS 01/20/23- headache has improved significantly    PLAN:  PT FREQUENCY: 2x/week for 6 weeks, then 1x/week  PT DURATION: 12 weeks  PLANNED INTERVENTIONS: Therapeutic exercises, Therapeutic activity, Neuromuscular re-education, Balance training, Gait training, Patient/Family education, Self Care, Joint mobilization, Dry Needling, Electrical stimulation, Spinal manipulation, Spinal mobilization, Cryotherapy, Moist heat, Taping, Traction, Ultrasound, Manual therapy, and Re-evaluation  PLAN FOR NEXT SESSION: continue to progress cervical strengthening, posterior shoulder strengthening as well as stretches, continue manual therapy and TrDN    Jena Gauss, PT, DPT  02/03/2023, 11:54 AM

## 2023-02-07 DIAGNOSIS — D692 Other nonthrombocytopenic purpura: Secondary | ICD-10-CM | POA: Diagnosis not present

## 2023-02-07 DIAGNOSIS — G518 Other disorders of facial nerve: Secondary | ICD-10-CM | POA: Diagnosis not present

## 2023-02-07 DIAGNOSIS — L820 Inflamed seborrheic keratosis: Secondary | ICD-10-CM | POA: Diagnosis not present

## 2023-02-07 DIAGNOSIS — G43719 Chronic migraine without aura, intractable, without status migrainosus: Secondary | ICD-10-CM | POA: Diagnosis not present

## 2023-02-07 DIAGNOSIS — L821 Other seborrheic keratosis: Secondary | ICD-10-CM | POA: Diagnosis not present

## 2023-02-07 DIAGNOSIS — M791 Myalgia, unspecified site: Secondary | ICD-10-CM | POA: Diagnosis not present

## 2023-02-07 DIAGNOSIS — L814 Other melanin hyperpigmentation: Secondary | ICD-10-CM | POA: Diagnosis not present

## 2023-02-07 DIAGNOSIS — L570 Actinic keratosis: Secondary | ICD-10-CM | POA: Diagnosis not present

## 2023-02-07 DIAGNOSIS — M542 Cervicalgia: Secondary | ICD-10-CM | POA: Diagnosis not present

## 2023-02-07 DIAGNOSIS — E109 Type 1 diabetes mellitus without complications: Secondary | ICD-10-CM | POA: Diagnosis not present

## 2023-02-07 DIAGNOSIS — L590 Erythema ab igne [dermatitis ab igne]: Secondary | ICD-10-CM | POA: Diagnosis not present

## 2023-02-08 ENCOUNTER — Ambulatory Visit: Payer: Medicare Other | Admitting: Physical Therapy

## 2023-02-10 DIAGNOSIS — I129 Hypertensive chronic kidney disease with stage 1 through stage 4 chronic kidney disease, or unspecified chronic kidney disease: Secondary | ICD-10-CM | POA: Diagnosis not present

## 2023-02-10 DIAGNOSIS — M069 Rheumatoid arthritis, unspecified: Secondary | ICD-10-CM | POA: Diagnosis not present

## 2023-02-14 DIAGNOSIS — E109 Type 1 diabetes mellitus without complications: Secondary | ICD-10-CM | POA: Diagnosis not present

## 2023-02-15 ENCOUNTER — Ambulatory Visit: Payer: Medicare Other | Admitting: Physical Therapy

## 2023-02-15 ENCOUNTER — Encounter: Payer: Self-pay | Admitting: Physical Therapy

## 2023-02-15 DIAGNOSIS — M542 Cervicalgia: Secondary | ICD-10-CM

## 2023-02-15 DIAGNOSIS — R252 Cramp and spasm: Secondary | ICD-10-CM | POA: Diagnosis not present

## 2023-02-15 DIAGNOSIS — H8111 Benign paroxysmal vertigo, right ear: Secondary | ICD-10-CM | POA: Diagnosis not present

## 2023-02-15 NOTE — Therapy (Signed)
OUTPATIENT PHYSICAL THERAPY TREATMENT   Patient Name: Becky [name] MRN: [id number] DOB:[birth year], 67 y.o., female Today's Date: 02/15/2023  END OF SESSION:  PT End of Session - 02/15/23 1316     Visit Number 7    Date for PT Re-Evaluation 03/30/23    Authorization Type [employer]    Progress Note Due on Visit 10    PT Start Time 1316    PT Stop Time 1400    PT Time Calculation (min) 44 min    Activity Tolerance Patient tolerated treatment well    Behavior During Therapy WFL for tasks assessed/performed             Past Medical History:  Diagnosis Date   Abnormal ECG    Anemia    Anxiety    Cataract    . Cateracts bil eyes removed   Eczema    GERD (gastroesophageal reflux disease)    Heart murmur    History of COVID-19 06/14/2020   Hypercholesterolemia    IDDM (insulin dependent diabetes mellitus) 1969   Migraine headache    Neuromuscular disorder (HCC)    bil legs & feet - neuropathy   Neuropathy    Osteoporosis    10/2012   Otosclerosis    Pigmented purpura (HCC)    RA (rheumatoid arthritis) (HCC) 2000   S/P cardiac cath 01/2012   Normal coronaries and normal LV function.   Sjoegren syndrome    Past Surgical History:  Procedure Laterality Date   back ablation  03/2020   back ablation  10/09/2020   BREAST LUMPECTOMY  1995   CARPAL TUNNEL RELEASE  2000   CATARACT EXTRACTION Right 09-05-12   CATARACT EXTRACTION Left 3/15   CESAREAN SECTION  1987   COLONOSCOPY     EYE SURGERY     diabetic retinopathy    HYSTEROSCOPY  02/2001   and D & C   LYMPH NODE DISSECTION  2002   TOE SURGERY  2000   bone removed   Patient Active Problem List   Diagnosis Date Noted   Posterior vitreous detachment of left eye 09/15/2021   Dry eye syndrome, bilateral 09/15/2021   Scoliosis 07/24/2020   Spondylosis without myelopathy or radiculopathy, lumbar region 07/24/2020   Chronic bilateral low back pain without sciatica 07/24/2020   Posterior vitreous detachment  of right eye 03/18/2020   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 determined by examination associated with type 1 diabetes mellitus (HCC) 03/18/2020   Stable treated proliferative diabetic retinopathy of right eye determined by examination associated with type 2 diabetes mellitus (HCC) 03/18/2020   History of vitrectomy 03/18/2020   Complicated UTI (urinary tract infection) 12/13/2018   Failure of outpatient treatment 12/11/2018   Acute metabolic encephalopathy 12/11/2018   Fever 12/10/2018   At high risk for breast cancer 05/09/2018   Eosinophilia 01/31/2017   Iritis 01/04/2017   History of joint swelling 08/24/2016   Osteoarthritis of knees, bilateral 08/24/2016   History of migraine 08/24/2016   History of depression 08/24/2016   History of osteoporosis 08/24/2016   DDD (degenerative disc disease), lumbar 08/24/2016   High risk medication use 08/24/2016   Trigger finger, left middle finger 08/24/2016   Trigger finger, right ring finger 08/24/2016   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   Increased risk of breast cancer 07/06/2015   IDDM (insulin dependent diabetes mellitus) 07/06/2015   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 without organ or systems involvement (HCC) 07/06/2015  Sjogren's syndrome (HCC) 07/06/2015   Genetic testing 05/12/2015   Family history of breast cancer 04/15/2015   Fibrocystic breast disease 10/11/2012   GERD 10/01/2009   Chest pain with moderate risk for cardiac etiology 09/02/2009   DYSPHAGIA 09/02/2009   ANXIETY 08/15/2009   ULCER-GASTRIC 08/15/2009   CONSTIPATION 08/15/2009   SLOW TRANSIT CONSTIPATION 08/15/2009   FLATULENCE ERUCTATION AND GAS PAIN 08/15/2009    PCP: Adrian Prince, MD  REFERRING PROVIDER: Rosendo Gros, MD   REFERRING DIAG:  M50.30 (ICD-10-CM) - DDD (degenerative disc disease), cervical  [phone] (ICD-10-CM) - Trapezius muscle spasm   THERAPY DIAG:   Cervicalgia  Cramp and spasm  Rationale for Evaluation and Treatment: Rehabilitation  ONSET DATE: chronic   SUBJECTIVE:                                                                                                                                                                                                         SUBJECTIVE STATEMENT: Had trigger point injections last week, feels like the TrDN helps more, since as soon as the anesthesia wears off the effects wear off.  She feels dizzy again, since she woke up.   Overall neck feels better since started PT.  Hand dominance: Right  PERTINENT HISTORY:  Chronic low back pain with radiculopathy, myofascial trigger point injections every 6 weeks, back ablation, scoliosis, osteoporosis, RA, Type 1 DM, neuropathy, Sjogren's syndrome   PAIN:  Are you having pain? Yes: NPRS scale: 7/10 Pain location: R Upper trap region, R side neck Pain description: tight, like a brick, headache, into her eye Aggravating factors: not sure, always tight Relieving factors: tries all the stretches, in past PT and TrDN has helped significantly  PRECAUTIONS: None  RED FLAGS: None     WEIGHT BEARING RESTRICTIONS: No  FALLS:  Has patient fallen in last 6 months? No  LIVING ENVIRONMENT: Lives with: lives with their spouse Lives in: House/apartment Stairs: No Has following equipment at home: None  OCCUPATION: retired  PLOF: Independent  PATIENT GOALS: get rid of headache and decrease neck pain  NEXT MD VISIT:   OBJECTIVE:   DIAGNOSTIC FINDINGS:  MRI Cervical Spine 01/12/2022  IMPRESSION: 1. C4-C5 mild spinal canal stenosis and mild right neural foraminal narrowing. 2. C6-C7 moderate right and mild left neural foraminal narrowing. 3. C3-C4 mild right neural foraminal narrowing. MRI Lumbar spine 01/12/2022  IMPRESSION: 1. L1-L2 mild right neural foraminal narrowing. Narrowing of the right lateral recess at this level could affect the  descending right L2 nerve roots. 2. Multilevel facet arthropathy, which can  be a cause of back pain.  PATIENT SURVEYS:  NDI 34/50= 68%  COGNITION: Overall cognitive status: Within functional limits for tasks assessed  SENSATION: Severe neuropathy bil feet, mild neuropathy in hands, sensations intact to light touch in hands, denies numbness or tingling.  POSTURE: rounded shoulders and decreased lumbar lordosis  PALPATION: Tightness/tenderness throughout cervical paraspinals, R UT/levator scapulae   CERVICAL ROM:   Active ROM A/PROM (deg) eval  Flexion 35p!  Extension 30  Right lateral flexion 30p!  Left lateral flexion 30p!  Right rotation 50p!  Left rotation 60   (Blank rows = not tested)  UPPER EXTREMITY ROM: full symmetric AROM for both shoulders, reports history of RA and chronic L shoulder pain  UPPER EXTREMITY MMT:  no deficits in bil UE  CERVICAL SPECIAL TESTS:  Spurling's test: Negative and Sharp pursor's test: Negative  TODAY'S TREATMENT:                                                                                                                              DATE:   02/15/23 Therapeutic Activity:  reassessing dizziness.  All canals negative for dizziness or nystagmus.  Manual Therapy: to decrease muscle spasm and pain and improve mobility STM/TPR to cervical paraspinals, bil UT, skilled palpation and monitoring during dry needling.  In supine gentle PA mobs, NAGS into rotation and cervical traction.  Trigger Point Dry-Needling  Treatment instructions: Expect mild to moderate muscle soreness. S/S of pneumothorax if dry needled over a lung field, and to seek immediate medical attention should they occur. Patient verbalized understanding of these instructions and education. Patient Consent Given: Yes Education handout provided: Previously provided Muscles treated: R UT, L/S, R cervical multifidi C3-C6 Electrical stimulation performed: No Parameters:  N/A Treatment response/outcome: Twitch Response Elicited and Palpable Increase in Muscle Length    02/03/23 Manual Therapy: to decrease muscle spasm and pain and improve mobility STM/TPR to cervical paraspinals, R UPA mobs to cervical spine, STM/TPR to R UT levator, skilled palpation and monitoring during dry needling. Trigger Point Dry-Needling  Treatment instructions: Expect mild to moderate muscle soreness. S/S of pneumothorax if dry needled over a lung field, and to seek immediate medical attention should they occur. Patient verbalized understanding of these instructions and education. Patient Consent Given: Yes Education handout provided: Previously provided Muscles treated: R UT, L/S, R cervical multifidi C3-C6 Electrical stimulation performed: No Parameters: N/A Treatment response/outcome: Twitch Response Elicited and Palpable Increase in Muscle Length   02/01/2023  Canalith Repositioning: R epley Maneuver performed after positive R dix-hallpike, retest of R dix-hallpike with complete resolution of symptoms.   Manual Therapy: to decrease muscle spasm and pain and improve mobility UPA mobs cervical spine and STM/TPR to cervical paraspinals and bil UT and L/S in prone, skilled palpation and monitoring during dry needling. Trigger Point Dry-Needling  Treatment instructions: Expect mild to moderate muscle soreness. S/S of pneumothorax if dry needled over a lung field, and to seek immediate  medical attention should they occur. Patient verbalized understanding of these instructions and education. Patient Consent Given: Yes Education handout provided: Previously provided Muscles treated: bil UT, L/S, bil cervical multifidi C3-C6 Electrical stimulation performed: No Parameters: N/A Treatment response/outcome: Twitch Response Elicited and Palpable Increase in Muscle Length   PATIENT EDUCATION:  Education details:  continue HEP Person educated: Patient Education method:  Explanation Education comprehension: verbalized understanding  HOME EXERCISE PROGRAM: Access Code: [email or web address]   Access Code: [email or web address] URL: [email or web address] Date: 01/20/2023 Prepared by: Harrie Foreman  Exercises - Supine Chin Tuck on Pillow  - 1 x daily - 7 x weekly - 3 sets - 10 reps - Supine Cervical Rotation AROM on Flat Ball  - 1 x daily - 7 x weekly - 3 sets - 10 reps   ASSESSMENT:  CLINICAL IMPRESSION: Becky [name] reported more dizziness today but was negative for BPPV.  Reassured it may be cervicogenic as she was having more neck pain and tightness today, continued with manual therapy including TrDN, also gentle cervical traction which she reported really helped.  Discussed different self traction units, although may need to avoid over the door due to TMJ, she is getting mouth splint from her dentist as she does grind her teeth which may also be contributing  to her headaches and neck pain.  Reported decreased pain and HA following interventions.    Becky [name] continues to demonstrate potential for improvement and would benefit from continued skilled therapy to address impairments.      OBJECTIVE IMPAIRMENTS: decreased ROM, hypomobility, increased fascial restrictions, increased muscle spasms, postural dysfunction, and pain.   ACTIVITY LIMITATIONS: carrying, bending, and sleeping  PARTICIPATION LIMITATIONS: interpersonal relationship and driving  PERSONAL FACTORS: Age, Past/current experiences, Time since onset of injury/illness/exacerbation, and 3+ comorbidities: Chronic low back pain with radiculopathy, myofascial trigger point injections every 6 weeks, back ablation, scoliosis, osteoporosis, RA, Type 1 DM, neuropathy, Sjogren's syndrome  are also affecting patient's functional outcome.   REHAB POTENTIAL: Good  CLINICAL DECISION MAKING: Evolving/moderate complexity  EVALUATION COMPLEXITY: Moderate   GOALS: Goals reviewed with patient?  Yes  SHORT TERM GOALS: Target date: 01/19/2023   Patient will be independent with initial HEP.  Baseline: needs review Goal status: MET 01/20/23  LONG TERM GOALS: Target date: 03/30/2023   Patient will be independent with advanced/ongoing HEP to improve outcomes and carryover.  Baseline:  Goal status: IN PROGRESS  2.  Patient will report 75% improvement in neck pain to improve QOL.  Baseline:  Goal status: IN PROGRESS  3.  Patient will demonstrate full pain free cervical ROM for safety with driving.  Baseline: see objective Goal status: IN PROGRESS  4.  Patient will report 7 points or more improvement on NDI to demonstrate improved functional ability.  Baseline: 34/50 Goal status: IN PROGRESS  5.  Patient will report 75% improvement in frequency and intensity of headaches. Baseline:  Goal status: IN PROGRESS 01/20/23- headache has improved significantly    PLAN:  PT FREQUENCY: 2x/week for 6 weeks, then 1x/week  PT DURATION: 12 weeks  PLANNED INTERVENTIONS: Therapeutic exercises, Therapeutic activity, Neuromuscular re-education, Balance training, Gait training, Patient/Family education, Self Care, Joint mobilization, Dry Needling, Electrical stimulation, Spinal manipulation, Spinal mobilization, Cryotherapy, Moist heat, Taping, Traction, Ultrasound, Manual therapy, and Re-evaluation  PLAN FOR NEXT SESSION: continue to progress cervical strengthening, posterior shoulder strengthening as well as stretches, continue manual therapy and TrDN    Jena Gauss, PT, DPT  02/15/2023, 4:55 PM

## 2023-02-17 DIAGNOSIS — K080 Exfoliation of teeth due to systemic causes: Secondary | ICD-10-CM | POA: Diagnosis not present

## 2023-02-21 DIAGNOSIS — E109 Type 1 diabetes mellitus without complications: Secondary | ICD-10-CM | POA: Diagnosis not present

## 2023-02-22 ENCOUNTER — Ambulatory Visit: Payer: Medicare Other | Attending: Orthopedic Surgery | Admitting: Physical Therapy

## 2023-02-22 ENCOUNTER — Encounter: Payer: Self-pay | Admitting: Physical Therapy

## 2023-02-22 DIAGNOSIS — R252 Cramp and spasm: Secondary | ICD-10-CM | POA: Diagnosis not present

## 2023-02-22 DIAGNOSIS — M542 Cervicalgia: Secondary | ICD-10-CM | POA: Diagnosis not present

## 2023-02-22 NOTE — Therapy (Signed)
OUTPATIENT PHYSICAL THERAPY TREATMENT Progress Note Reporting Period 01/05/2023 to 02/22/2023  See note below for Objective Data and Assessment of Progress/Goals.      Patient Name: Becky [name] MRN: [id number] DOB:[birth year], 67 y.o., female Today's Date: 02/22/2023  END OF SESSION:  PT End of Session - 02/22/23 1328     Visit Number 8    Date for PT Re-Evaluation 03/30/23    Authorization Type [employer]    Progress Note Due on Visit 10    PT Start Time 1317    PT Stop Time 1400    PT Time Calculation (min) 43 min    Activity Tolerance Patient tolerated treatment well    Behavior During Therapy WFL for tasks assessed/performed             Past Medical History:  Diagnosis Date   Abnormal ECG    Anemia    Anxiety    Cataract    . Cateracts bil eyes removed   Eczema    GERD (gastroesophageal reflux disease)    Heart murmur    History of COVID-19 06/14/2020   Hypercholesterolemia    IDDM (insulin dependent diabetes mellitus) 1969   Migraine headache    Neuromuscular disorder (HCC)    bil legs & feet - neuropathy   Neuropathy    Osteoporosis    10/2012   Otosclerosis    Pigmented purpura (HCC)    RA (rheumatoid arthritis) (HCC) 2000   S/P cardiac cath 01/2012   Normal coronaries and normal LV function.   Sjoegren syndrome    Past Surgical History:  Procedure Laterality Date   back ablation  03/2020   back ablation  10/09/2020   BREAST LUMPECTOMY  1995   CARPAL TUNNEL RELEASE  2000   CATARACT EXTRACTION Right 09-05-12   CATARACT EXTRACTION Left 3/15   CESAREAN SECTION  1987   COLONOSCOPY     EYE SURGERY     diabetic retinopathy    HYSTEROSCOPY  02/2001   and D & C   LYMPH NODE DISSECTION  2002   TOE SURGERY  2000   bone removed   Patient Active Problem List   Diagnosis Date Noted   Posterior vitreous detachment of left eye 09/15/2021   Dry eye syndrome, bilateral 09/15/2021   Scoliosis 07/24/2020   Spondylosis without myelopathy or  radiculopathy, lumbar region 07/24/2020   Chronic bilateral low back pain without sciatica 07/24/2020   Posterior vitreous detachment of right eye 03/18/2020   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 determined by examination associated with type 1 diabetes mellitus (HCC) 03/18/2020   Stable treated proliferative diabetic retinopathy of right eye determined by examination associated with type 2 diabetes mellitus (HCC) 03/18/2020   History of vitrectomy 03/18/2020   Complicated UTI (urinary tract infection) 12/13/2018   Failure of outpatient treatment 12/11/2018   Acute metabolic encephalopathy 12/11/2018   Fever 12/10/2018   At high risk for breast cancer 05/09/2018   Eosinophilia 01/31/2017   Iritis 01/04/2017   History of joint swelling 08/24/2016   Osteoarthritis of knees, bilateral 08/24/2016   History of migraine 08/24/2016   History of depression 08/24/2016   History of osteoporosis 08/24/2016   DDD (degenerative disc disease), lumbar 08/24/2016   High risk medication use 08/24/2016   Trigger finger, left middle finger 08/24/2016   Trigger finger, right ring finger 08/24/2016   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   Increased risk of breast cancer 07/06/2015   IDDM (insulin dependent diabetes  mellitus) 07/06/2015   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 without organ or systems involvement (HCC) 07/06/2015   Sjogren's syndrome (HCC) 07/06/2015   Genetic testing 05/12/2015   Family history of breast cancer 04/15/2015   Fibrocystic breast disease 10/11/2012   GERD 10/01/2009   Chest pain with moderate risk for cardiac etiology 09/02/2009   DYSPHAGIA 09/02/2009   ANXIETY 08/15/2009   ULCER-GASTRIC 08/15/2009   CONSTIPATION 08/15/2009   SLOW TRANSIT CONSTIPATION 08/15/2009   FLATULENCE ERUCTATION AND GAS PAIN 08/15/2009    PCP: Adrian Prince, MD  REFERRING PROVIDER: Rosendo Gros, MD   REFERRING DIAG:  M50.30  (ICD-10-CM) - DDD (degenerative disc disease), cervical  [phone] (ICD-10-CM) - Trapezius muscle spasm   THERAPY DIAG:  Cervicalgia  Cramp and spasm  Rationale for Evaluation and Treatment: Rehabilitation  ONSET DATE: chronic   SUBJECTIVE:                                                                                                                                                                                                         SUBJECTIVE STATEMENT: Brought all her HEP from this episode and previous episode to go over today, as she is feeling overwhelmed.  Hand dominance: Right  PERTINENT HISTORY:  Chronic low back pain with radiculopathy, myofascial trigger point injections every 6 weeks, back ablation, scoliosis, osteoporosis, RA, Type 1 DM, neuropathy, Sjogren's syndrome   PAIN:  Are you having pain? Yes: NPRS scale: 5/10 Pain location: R Upper trap region, R side neck Pain description: tight, like a brick, headache, into her eye Aggravating factors: not sure, always tight Relieving factors: tries all the stretches, in past PT and TrDN has helped significantly  PRECAUTIONS: None  RED FLAGS: None     WEIGHT BEARING RESTRICTIONS: No  FALLS:  Has patient fallen in last 6 months? No  LIVING ENVIRONMENT: Lives with: lives with their spouse Lives in: House/apartment Stairs: No Has following equipment at home: None  OCCUPATION: retired  PLOF: Independent  PATIENT GOALS: get rid of headache and decrease neck pain  NEXT MD VISIT:   OBJECTIVE:   DIAGNOSTIC FINDINGS:  MRI Cervical Spine 01/12/2022  IMPRESSION: 1. C4-C5 mild spinal canal stenosis and mild right neural foraminal narrowing. 2. C6-C7 moderate right and mild left neural foraminal narrowing. 3. C3-C4 mild right neural foraminal narrowing. MRI Lumbar spine 01/12/2022  IMPRESSION: 1. L1-L2 mild right neural foraminal narrowing. Narrowing of the right lateral recess at this level could affect the  descending right L2 nerve roots. 2. Multilevel facet arthropathy, which can be a  cause of back pain.  PATIENT SURVEYS:  NDI 34/50= 68%  COGNITION: Overall cognitive status: Within functional limits for tasks assessed  SENSATION: Severe neuropathy bil feet, mild neuropathy in hands, sensations intact to light touch in hands, denies numbness or tingling.  POSTURE: rounded shoulders and decreased lumbar lordosis  PALPATION: Tightness/tenderness throughout cervical paraspinals, R UT/levator scapulae   CERVICAL ROM:   Active ROM A/PROM (deg) eval 02/22/23 AROM  Flexion 35p! 35  Extension 30 30  Right lateral flexion 30p! 30  Left lateral flexion 30p! 30  Right rotation 50p! 60  Left rotation 60 60   (Blank rows = not tested)  UPPER EXTREMITY ROM: full symmetric AROM for both shoulders, reports history of RA and chronic L shoulder pain  UPPER EXTREMITY MMT:  no deficits in bil UE  CERVICAL SPECIAL TESTS:  Spurling's test: Negative and Sharp pursor's test: Negative  TODAY'S TREATMENT:                                                                                                                              DATE:   02/22/2023 Therapeutic Exercise: to improve strength and mobility.  Demo, verbal and tactile cues throughout for technique. Review of HEP  Resisted chin tuck - RTB x 10  Shoulder ER with RTB x 10  Shoulder rows RTB x 10  Shoulder extension RTB x 10 SCM stretch with finger lock Masseter self massage Manual Therapy: to decrease muscle spasm and pain and improve mobility STM/TPR to cervical paraspinals, gentle PA mobs, NAGS into rotation and cervical traction.    02/15/23 Therapeutic Activity:  reassessing dizziness.  All canals negative for dizziness or nystagmus.  Manual Therapy: to decrease muscle spasm and pain and improve mobility STM/TPR to cervical paraspinals, bil UT, skilled palpation and monitoring during dry needling.  In supine gentle PA mobs, NAGS into  rotation and cervical traction.  Trigger Point Dry-Needling  Treatment instructions: Expect mild to moderate muscle soreness. S/S of pneumothorax if dry needled over a lung field, and to seek immediate medical attention should they occur. Patient verbalized understanding of these instructions and education. Patient Consent Given: Yes Education handout provided: Previously provided Muscles treated: R UT, L/S, R cervical multifidi C3-C6 Electrical stimulation performed: No Parameters: N/A Treatment response/outcome: Twitch Response Elicited and Palpable Increase in Muscle Length    02/03/23 Manual Therapy: to decrease muscle spasm and pain and improve mobility STM/TPR to cervical paraspinals, R UPA mobs to cervical spine, STM/TPR to R UT levator, skilled palpation and monitoring during dry needling. Trigger Point Dry-Needling  Treatment instructions: Expect mild to moderate muscle soreness. S/S of pneumothorax if dry needled over a lung field, and to seek immediate medical attention should they occur. Patient verbalized understanding of these instructions and education. Patient Consent Given: Yes Education handout provided: Previously provided Muscles treated: R UT, L/S, R cervical multifidi C3-C6 Electrical stimulation performed: No Parameters: N/A Treatment response/outcome: Twitch Response Elicited and Palpable Increase  in Muscle Length  PATIENT EDUCATION:  Education details:  HEP update, issued RTB Person educated: Patient Education method: Explanation, Demonstration, Verbal cues, and Handouts Education comprehension: verbalized understanding and returned demonstration  HOME EXERCISE PROGRAM: Access Code: [email or web address]   Access Code: [email or web address] URL: [email or web address] Date: 02/22/2023 Prepared by: Harrie Foreman  Exercises - Cervical Retraction with Resistance  - 1 x daily - 7 x weekly - 1 sets - 5-10 reps - 5-10 sec  hold - Sternocleidomastoid Stretch  - 1 x  daily - 7 x weekly - External Masseter Mobilization  - 1 x daily - 7 x weekly - Supine Chest Stretch on Foam Roll  - 1 x daily - 7 x weekly - 1 sets - 1 reps - 1 min hold  - use pool noodle - Shoulder External Rotation and Scapular Retraction with Resistance  - 1 x daily - 7 x weekly - 1-2 sets - 10 reps - Shoulder extension with resistance - Neutral  - 1 x daily - 7 x weekly - 1-2 sets - 10 reps - Standing Shoulder Row with Anchored Resistance  - 1 x daily - 7 x weekly - 1-2 sets - 10 reps   ASSESSMENT:  CLINICAL IMPRESSION: Becky [name] reports 50% improvement in neck pain and 75% improvement in headaches since starting PT.  Her cervical AROM has not changed but is just tight, not painful.  Today focused on reviewing her exercises as she was feeling overwhelmed, ended up starting fresh with her exercises, issued red theraband (had green one at home), she reported all exercises felt comfortable.  She will get pool noodle for chest stretch and perform with her back exercises which she still does daily from previous episode of PT.   Becky [name] continues to demonstrate potential for improvement and would benefit from continued skilled therapy to address impairments.      OBJECTIVE IMPAIRMENTS: decreased ROM, hypomobility, increased fascial restrictions, increased muscle spasms, postural dysfunction, and pain.   ACTIVITY LIMITATIONS: carrying, bending, and sleeping  PARTICIPATION LIMITATIONS: interpersonal relationship and driving  PERSONAL FACTORS: Age, Past/current experiences, Time since onset of injury/illness/exacerbation, and 3+ comorbidities: Chronic low back pain with radiculopathy, myofascial trigger point injections every 6 weeks, back ablation, scoliosis, osteoporosis, RA, Type 1 DM, neuropathy, Sjogren's syndrome  are also affecting patient's functional outcome.   REHAB POTENTIAL: Good  CLINICAL DECISION MAKING: Evolving/moderate complexity  EVALUATION COMPLEXITY:  Moderate   GOALS: Goals reviewed with patient? Yes  SHORT TERM GOALS: Target date: 01/19/2023   Patient will be independent with initial HEP.  Baseline: needs review Goal status: MET 01/20/23  LONG TERM GOALS: Target date: 03/30/2023   Patient will be independent with advanced/ongoing HEP to improve outcomes and carryover.  Baseline:  Goal status: IN PROGRESS 02/22/23 - met for current, progressed.   2.  Patient will report 75% improvement in neck pain to improve QOL.  Baseline:  Goal status: IN PROGRESS 02/22/2023 - 50% improvement.   3.  Patient will demonstrate full pain free cervical ROM for safety with driving.  Baseline: see objective Goal status: IN PROGRESS 02/22/23 - tight, but not painful, see objective.   4.  Patient will report 7 points or more improvement on NDI to demonstrate improved functional ability.  Baseline: 34/50 Goal status: IN PROGRESS  5.  Patient will report 75% improvement in frequency and intensity of headaches. Baseline:  Goal status: IN PROGRESS 01/20/23- headache has improved significantly  02/22/23- 70% improvement.    PLAN:  PT FREQUENCY: 2x/week  for 6 weeks, then 1x/week  PT DURATION: 12 weeks  PLANNED INTERVENTIONS: Therapeutic exercises, Therapeutic activity, Neuromuscular re-education, Balance training, Gait training, Patient/Family education, Self Care, Joint mobilization, Dry Needling, Electrical stimulation, Spinal manipulation, Spinal mobilization, Cryotherapy, Moist heat, Taping, Traction, Ultrasound, Manual therapy, and Re-evaluation  PLAN FOR NEXT SESSION: continue to progress cervical strengthening, posterior shoulder strengthening as well as stretches, continue manual therapy and TrDN.  Try TRACTION.    Jena Gauss, PT, DPT  02/22/2023, 4:41 PM

## 2023-02-23 ENCOUNTER — Other Ambulatory Visit: Payer: Self-pay | Admitting: *Deleted

## 2023-02-23 DIAGNOSIS — H04123 Dry eye syndrome of bilateral lacrimal glands: Secondary | ICD-10-CM | POA: Diagnosis not present

## 2023-02-23 DIAGNOSIS — Z961 Presence of intraocular lens: Secondary | ICD-10-CM | POA: Diagnosis not present

## 2023-02-23 DIAGNOSIS — Z79899 Other long term (current) drug therapy: Secondary | ICD-10-CM | POA: Diagnosis not present

## 2023-02-24 LAB — COMPLETE METABOLIC PANEL WITH GFR
AG Ratio: 1.4 (calc) (ref 1.0–2.5)
ALT: 19 U/L (ref 6–29)
AST: 31 U/L (ref 10–35)
Albumin: 4 g/dL (ref 3.6–5.1)
Alkaline phosphatase (APISO): 69 U/L (ref 37–153)
BUN: 11 mg/dL (ref 7–25)
CO2: 26 mmol/L (ref 20–32)
Calcium: 9.7 mg/dL (ref 8.6–10.4)
Chloride: 105 mmol/L (ref 98–110)
Creat: 0.98 mg/dL (ref 0.50–1.05)
Globulin: 2.9 g/dL (ref 1.9–3.7)
Glucose, Bld: 249 mg/dL — ABNORMAL HIGH (ref 65–99)
Potassium: 4.2 mmol/L (ref 3.5–5.3)
Sodium: 140 mmol/L (ref 135–146)
Total Bilirubin: 0.5 mg/dL (ref 0.2–1.2)
Total Protein: 6.9 g/dL (ref 6.1–8.1)
eGFR: 64 mL/min/{1.73_m2} (ref >=60)

## 2023-02-24 LAB — CBC WITH DIFFERENTIAL/PLATELET
Absolute Monocytes: 360 {cells}/uL (ref 200–950)
Basophils Absolute: 71 {cells}/uL (ref 0–200)
Basophils Relative: 1.2 %
Eosinophils Absolute: 407 {cells}/uL (ref 15–500)
Eosinophils Relative: 6.9 %
HCT: 42.9 % (ref 35.0–45.0)
Hemoglobin: 14.2 g/dL (ref 11.7–15.5)
Lymphs Abs: 1528 {cells}/uL (ref 850–3900)
MCH: 30 pg (ref 27.0–33.0)
MCHC: 33.1 g/dL (ref 32.0–36.0)
MCV: 90.7 fL (ref 80.0–100.0)
MPV: 13.5 fL — ABNORMAL HIGH (ref 7.5–12.5)
Monocytes Relative: 6.1 %
Neutro Abs: 3534 {cells}/uL (ref 1500–7800)
Neutrophils Relative %: 59.9 %
Platelets: 279 10*3/uL (ref 140–400)
RBC: 4.73 10*6/uL (ref 3.80–5.10)
RDW: 13.3 % (ref 11.0–15.0)
Total Lymphocyte: 25.9 %
WBC: 5.9 10*3/uL (ref 3.8–10.8)

## 2023-02-28 DIAGNOSIS — E109 Type 1 diabetes mellitus without complications: Secondary | ICD-10-CM | POA: Diagnosis not present

## 2023-02-28 NOTE — Progress Notes (Signed)
Glucose is elevated 249-please notify the patient. Rest of CMP WNL.  CBC stable.

## 2023-03-01 ENCOUNTER — Ambulatory Visit: Payer: Medicare Other | Admitting: Physical Therapy

## 2023-03-01 ENCOUNTER — Encounter: Payer: Self-pay | Admitting: Physical Therapy

## 2023-03-01 DIAGNOSIS — M542 Cervicalgia: Secondary | ICD-10-CM

## 2023-03-01 DIAGNOSIS — R252 Cramp and spasm: Secondary | ICD-10-CM

## 2023-03-01 NOTE — Therapy (Signed)
OUTPATIENT PHYSICAL THERAPY TREATMENT    Patient Name: Becky [name] MRN: [id number] DOB:02-05-1956, 67 y.o., female Today's Date: 03/01/2023  END OF SESSION:  PT End of Session - 03/01/23 1331     Visit Number 9    Date for PT Re-Evaluation 03/30/23    Authorization Type [employer]    Progress Note Due on Visit --   progress note done visit 8   PT Start Time 1323    PT Stop Time 1401    PT Time Calculation (min) 38 min    Activity Tolerance Patient tolerated treatment well    Behavior During Therapy WFL for tasks assessed/performed             Past Medical History:  Diagnosis Date   Abnormal ECG    Anemia    Anxiety    Cataract    . Cateracts bil eyes removed   Eczema    GERD (gastroesophageal reflux disease)    Heart murmur    History of COVID-19 06/14/2020   Hypercholesterolemia    IDDM (insulin dependent diabetes mellitus) 1969   Migraine headache    Neuromuscular disorder (HCC)    bil legs & feet - neuropathy   Neuropathy    Osteoporosis    10/2012   Otosclerosis    Pigmented purpura (HCC)    RA (rheumatoid arthritis) (HCC) 2000   S/P cardiac cath 01/2012   Normal coronaries and normal LV function.   Sjoegren syndrome    Past Surgical History:  Procedure Laterality Date   back ablation  03/2020   back ablation  10/09/2020   BREAST LUMPECTOMY  1995   CARPAL TUNNEL RELEASE  2000   CATARACT EXTRACTION Right 09-05-12   CATARACT EXTRACTION Left 3/15   CESAREAN SECTION  1987   COLONOSCOPY     EYE SURGERY     diabetic retinopathy    HYSTEROSCOPY  02/2001   and D & C   LYMPH NODE DISSECTION  2002   TOE SURGERY  2000   bone removed   Patient Active Problem List   Diagnosis Date Noted   Posterior vitreous detachment of left eye 09/15/2021   Dry eye syndrome, bilateral 09/15/2021   Scoliosis 07/24/2020   Spondylosis without myelopathy or radiculopathy, lumbar region 07/24/2020   Chronic bilateral low back pain without sciatica 07/24/2020    Posterior vitreous detachment of right eye 03/18/2020   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 determined by examination associated with type 1 diabetes mellitus (HCC) 03/18/2020   Stable treated proliferative diabetic retinopathy of right eye determined by examination associated with type 2 diabetes mellitus (HCC) 03/18/2020   History of vitrectomy 03/18/2020   Complicated UTI (urinary tract infection) 12/13/2018   Failure of outpatient treatment 12/11/2018   Acute metabolic encephalopathy 12/11/2018   Fever 12/10/2018   At high risk for breast cancer 05/09/2018   Eosinophilia 01/31/2017   Iritis 01/04/2017   History of joint swelling 08/24/2016   Osteoarthritis of knees, bilateral 08/24/2016   History of migraine 08/24/2016   History of depression 08/24/2016   History of osteoporosis 08/24/2016   DDD (degenerative disc disease), lumbar 08/24/2016   High risk medication use 08/24/2016   Trigger finger, left middle finger 08/24/2016   Trigger finger, right ring finger 08/24/2016   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   Increased risk of breast cancer 07/06/2015   IDDM (insulin dependent diabetes mellitus) 07/06/2015   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 without organ  or systems involvement (HCC) 07/06/2015   Sjogren's syndrome (HCC) 07/06/2015   Genetic testing 05/12/2015   Family history of breast cancer 04/15/2015   Fibrocystic breast disease 10/11/2012   GERD 10/01/2009   Chest pain with moderate risk for cardiac etiology 09/02/2009   DYSPHAGIA 09/02/2009   ANXIETY 08/15/2009   ULCER-GASTRIC 08/15/2009   CONSTIPATION 08/15/2009   SLOW TRANSIT CONSTIPATION 08/15/2009   FLATULENCE ERUCTATION AND GAS PAIN 08/15/2009    PCP: Adrian Prince, MD  REFERRING PROVIDER: Rosendo Gros, MD   REFERRING DIAG:  M50.30 (ICD-10-CM) - DDD (degenerative disc disease), cervical  [phone] (ICD-10-CM) - Trapezius muscle spasm    THERAPY DIAG:  Cervicalgia  Cramp and spasm  Rationale for Evaluation and Treatment: Rehabilitation  ONSET DATE: chronic   SUBJECTIVE:                                                                                                                                                                                                         SUBJECTIVE STATEMENT: Doing much better with neck exercises now.  Neck feels pretty good today.    Hand dominance: Right  PERTINENT HISTORY:  Chronic low back pain with radiculopathy, myofascial trigger point injections every 6 weeks, back ablation, scoliosis, osteoporosis, RA, Type 1 DM, neuropathy, Sjogren's syndrome   PAIN:  Are you having pain? Yes: NPRS scale: 5/10 Pain location: R Upper trap region, R side neck Pain description: tight, like a brick, headache, into her eye Aggravating factors: not sure, always tight Relieving factors: tries all the stretches, in past PT and TrDN has helped significantly  PRECAUTIONS: None  RED FLAGS: None     WEIGHT BEARING RESTRICTIONS: No  FALLS:  Has patient fallen in last 6 months? No  LIVING ENVIRONMENT: Lives with: lives with their spouse Lives in: House/apartment Stairs: No Has following equipment at home: None  OCCUPATION: retired  PLOF: Independent  PATIENT GOALS: get rid of headache and decrease neck pain  NEXT MD VISIT:   OBJECTIVE:   DIAGNOSTIC FINDINGS:  MRI Cervical Spine 01/12/2022  IMPRESSION: 1. C4-C5 mild spinal canal stenosis and mild right neural foraminal narrowing. 2. C6-C7 moderate right and mild left neural foraminal narrowing. 3. C3-C4 mild right neural foraminal narrowing. MRI Lumbar spine 01/12/2022  IMPRESSION: 1. L1-L2 mild right neural foraminal narrowing. Narrowing of the right lateral recess at this level could affect the descending right L2 nerve roots. 2. Multilevel facet arthropathy, which can be a cause of back pain.  PATIENT SURVEYS:  NDI 34/50=  68%  COGNITION: Overall cognitive status: Within functional  limits for tasks assessed  SENSATION: Severe neuropathy bil feet, mild neuropathy in hands, sensations intact to light touch in hands, denies numbness or tingling.  POSTURE: rounded shoulders and decreased lumbar lordosis  PALPATION: Tightness/tenderness throughout cervical paraspinals, R UT/levator scapulae   CERVICAL ROM:   Active ROM A/PROM (deg) eval 02/22/23 AROM  Flexion 35p! 35  Extension 30 30  Right lateral flexion 30p! 30  Left lateral flexion 30p! 30  Right rotation 50p! 60  Left rotation 60 60   (Blank rows = not tested)  UPPER EXTREMITY ROM: full symmetric AROM for both shoulders, reports history of RA and chronic L shoulder pain  UPPER EXTREMITY MMT:  no deficits in bil UE  CERVICAL SPECIAL TESTS:  Spurling's test: Negative and Sharp pursor's test: Negative  TODAY'S TREATMENT:                                                                                                                              DATE:   03/01/23  Manual Cervical Traction 10 min at 12# Self Care: Discussion of different self traction units and recommendations Manual Therapy: to decrease muscle spasm and pain and improve mobility STM/TPR to bil UT/LS, skilled palpation and monitoring during dry needling. Trigger Point Dry-Needling  Treatment instructions: Expect mild to moderate muscle soreness. S/S of pneumothorax if dry needled over a lung field, and to seek immediate medical attention should they occur. Patient verbalized understanding of these instructions and education. Patient Consent Given: Yes Education handout provided: Previously provided Muscles treated: bil UT/LS Electrical stimulation performed: No Parameters: N/A Treatment response/outcome: Twitch Response Elicited and Palpable Increase in Muscle Length  02/22/2023 Therapeutic Exercise: to improve strength and mobility.  Demo, verbal and tactile cues throughout for  technique. Review of HEP  Resisted chin tuck - RTB x 10  Shoulder ER with RTB x 10  Shoulder rows RTB x 10  Shoulder extension RTB x 10 SCM stretch with finger lock Masseter self massage Manual Therapy: to decrease muscle spasm and pain and improve mobility STM/TPR to cervical paraspinals, gentle PA mobs, NAGS into rotation and cervical traction.    02/15/23 Therapeutic Activity:  reassessing dizziness.  All canals negative for dizziness or nystagmus.  Manual Therapy: to decrease muscle spasm and pain and improve mobility STM/TPR to cervical paraspinals, bil UT, skilled palpation and monitoring during dry needling.  In supine gentle PA mobs, NAGS into rotation and cervical traction.  Trigger Point Dry-Needling  Treatment instructions: Expect mild to moderate muscle soreness. S/S of pneumothorax if dry needled over a lung field, and to seek immediate medical attention should they occur. Patient verbalized understanding of these instructions and education. Patient Consent Given: Yes Education handout provided: Previously provided Muscles treated: R UT, L/S, R cervical multifidi C3-C6 Electrical stimulation performed: No Parameters: N/A Treatment response/outcome: Twitch Response Elicited and Palpable Increase in Muscle Length   PATIENT EDUCATION:  Education details: info on traction  Person educated: Patient Education  method: Explanation and Handouts Education comprehension: verbalized understanding  HOME EXERCISE PROGRAM: Access Code: [email or web address]   Access Code: [email or web address] URL: [email or web address] Date: 02/22/2023 Prepared by: Harrie Foreman  Exercises - Cervical Retraction with Resistance  - 1 x daily - 7 x weekly - 1 sets - 5-10 reps - 5-10 sec  hold - Sternocleidomastoid Stretch  - 1 x daily - 7 x weekly - External Masseter Mobilization  - 1 x daily - 7 x weekly - Supine Chest Stretch on Foam Roll  - 1 x daily - 7 x weekly - 1 sets - 1 reps - 1 min hold  - use  pool noodle - Shoulder External Rotation and Scapular Retraction with Resistance  - 1 x daily - 7 x weekly - 1-2 sets - 10 reps - Shoulder extension with resistance - Neutral  - 1 x daily - 7 x weekly - 1-2 sets - 10 reps - Standing Shoulder Row with Anchored Resistance  - 1 x daily - 7 x weekly - 1-2 sets - 10 reps   ASSESSMENT:  CLINICAL IMPRESSION: Trialed traction unit today, [name] reported decreased pain with traction, will contact [employer] and see if will be covered by insurance, in meantime reviewed different inexpensive self traction units available on [employer], due to TMJ issues did not recommend over the door unit, but inflatable one is affordable and not place too much pressure on jaw.  She will try at home.  Still had tightness in bil UT/LS today, responded well to manual therapy with strong twitch response bil with TrDN and immediate improvement in muscle tension.   [name] continues to demonstrate potential for improvement and would benefit from continued skilled therapy to address impairments.      OBJECTIVE IMPAIRMENTS: decreased ROM, hypomobility, increased fascial restrictions, increased muscle spasms, postural dysfunction, and pain.   ACTIVITY LIMITATIONS: carrying, bending, and sleeping  PARTICIPATION LIMITATIONS: interpersonal relationship and driving  PERSONAL FACTORS: Age, Past/current experiences, Time since onset of injury/illness/exacerbation, and 3+ comorbidities: Chronic low back pain with radiculopathy, myofascial trigger point injections every 6 weeks, back ablation, scoliosis, osteoporosis, RA, Type 1 DM, neuropathy, Sjogren's syndrome  are also affecting patient's functional outcome.   REHAB POTENTIAL: Good  CLINICAL DECISION MAKING: Evolving/moderate complexity  EVALUATION COMPLEXITY: Moderate   GOALS: Goals reviewed with patient? Yes  SHORT TERM GOALS: Target date: 01/19/2023   Patient will be independent with initial HEP.  Baseline:  needs review Goal status: MET 01/20/23  LONG TERM GOALS: Target date: 03/30/2023   Patient will be independent with advanced/ongoing HEP to improve outcomes and carryover.  Baseline:  Goal status: IN PROGRESS 02/22/23 - met for current, progressed.   2.  Patient will report 75% improvement in neck pain to improve QOL.  Baseline:  Goal status: IN PROGRESS 02/22/2023 - 50% improvement.   3.  Patient will demonstrate full pain free cervical ROM for safety with driving.  Baseline: see objective Goal status: IN PROGRESS 02/22/23 - tight, but not painful, see objective.   4.  Patient will report 7 points or more improvement on NDI to demonstrate improved functional ability.  Baseline: 34/50 Goal status: IN PROGRESS  5.  Patient will report 75% improvement in frequency and intensity of headaches. Baseline:  Goal status: IN PROGRESS 01/20/23- headache has improved significantly  02/22/23- 70% improvement.    PLAN:  PT FREQUENCY: 2x/week for 6 weeks, then 1x/week  PT DURATION: 12 weeks  PLANNED INTERVENTIONS: Therapeutic exercises, Therapeutic activity, Neuromuscular re-education, Balance training,  Gait training, Patient/Family education, Self Care, Joint mobilization, Dry Needling, Electrical stimulation, Spinal manipulation, Spinal mobilization, Cryotherapy, Moist heat, Taping, Traction, Ultrasound, Manual therapy, and Re-evaluation  PLAN FOR NEXT SESSION: continue to progress cervical strengthening, posterior shoulder strengthening as well as stretches, continue manual therapy and TrDN, cervical traction.   Jena Gauss, PT, DPT  03/01/2023, 4:06 PM

## 2023-03-07 DIAGNOSIS — E109 Type 1 diabetes mellitus without complications: Secondary | ICD-10-CM | POA: Diagnosis not present

## 2023-03-08 ENCOUNTER — Encounter: Payer: Medicare Other | Admitting: Physical Therapy

## 2023-03-09 ENCOUNTER — Ambulatory Visit: Payer: Medicare Other | Admitting: Physical Therapy

## 2023-03-14 DIAGNOSIS — E109 Type 1 diabetes mellitus without complications: Secondary | ICD-10-CM | POA: Diagnosis not present

## 2023-03-15 ENCOUNTER — Encounter: Payer: Medicare Other | Admitting: Physical Therapy

## 2023-03-16 ENCOUNTER — Encounter: Payer: Self-pay | Admitting: Physical Therapy

## 2023-03-16 ENCOUNTER — Ambulatory Visit: Payer: Medicare Other | Admitting: Physical Therapy

## 2023-03-16 DIAGNOSIS — M542 Cervicalgia: Secondary | ICD-10-CM

## 2023-03-16 DIAGNOSIS — R252 Cramp and spasm: Secondary | ICD-10-CM | POA: Diagnosis not present

## 2023-03-16 DIAGNOSIS — K080 Exfoliation of teeth due to systemic causes: Secondary | ICD-10-CM | POA: Diagnosis not present

## 2023-03-16 NOTE — Therapy (Addendum)
OUTPATIENT PHYSICAL THERAPY TREATMENT/Discharge Summary     Patient Name: Becky [name] MRN: [id number] DOB:Oct 11, 1955, 67 y.o., female Today's Date: 03/16/2023  END OF SESSION:  PT End of Session - 03/16/23 1101     Visit Number 10    Date for PT Re-Evaluation 03/30/23    Authorization Type [employer]    Progress Note Due on Visit --   progress note done visit 8   PT Start Time 1101    PT Stop Time 1145    PT Time Calculation (min) 44 min    Activity Tolerance Patient tolerated treatment well    Behavior During Therapy WFL for tasks assessed/performed             Past Medical History:  Diagnosis Date   Abnormal ECG    Anemia    Anxiety    Cataract    . Cateracts bil eyes removed   Eczema    GERD (gastroesophageal reflux disease)    Heart murmur    History of COVID-19 06/14/2020   Hypercholesterolemia    IDDM (insulin dependent diabetes mellitus) 1969   Migraine headache    Neuromuscular disorder (HCC)    bil legs & feet - neuropathy   Neuropathy    Osteoporosis    10/2012   Otosclerosis    Pigmented purpura (HCC)    RA (rheumatoid arthritis) (HCC) 2000   S/P cardiac cath 01/2012   Normal coronaries and normal LV function.   Sjoegren syndrome    Past Surgical History:  Procedure Laterality Date   back ablation  03/2020   back ablation  10/09/2020   BREAST LUMPECTOMY  1995   CARPAL TUNNEL RELEASE  2000   CATARACT EXTRACTION Right 09-05-12   CATARACT EXTRACTION Left 3/15   CESAREAN SECTION  1987   COLONOSCOPY     EYE SURGERY     diabetic retinopathy    HYSTEROSCOPY  02/2001   and D & C   LYMPH NODE DISSECTION  2002   TOE SURGERY  2000   bone removed   Patient Active Problem List   Diagnosis Date Noted   Posterior vitreous detachment of left eye 09/15/2021   Dry eye syndrome, bilateral 09/15/2021   Scoliosis 07/24/2020   Spondylosis without myelopathy or radiculopathy, lumbar region 07/24/2020   Chronic bilateral low back pain without  sciatica 07/24/2020   Posterior vitreous detachment of right eye 03/18/2020   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 determined by examination associated with type 1 diabetes mellitus (HCC) 03/18/2020   Stable treated proliferative diabetic retinopathy of right eye determined by examination associated with type 2 diabetes mellitus (HCC) 03/18/2020   History of vitrectomy 03/18/2020   Complicated UTI (urinary tract infection) 12/13/2018   Failure of outpatient treatment 12/11/2018   Acute metabolic encephalopathy 12/11/2018   Fever 12/10/2018   At high risk for breast cancer 05/09/2018   Eosinophilia 01/31/2017   Iritis 01/04/2017   History of joint swelling 08/24/2016   Osteoarthritis of knees, bilateral 08/24/2016   History of migraine 08/24/2016   History of depression 08/24/2016   History of osteoporosis 08/24/2016   DDD (degenerative disc disease), lumbar 08/24/2016   High risk medication use 08/24/2016   Trigger finger, left middle finger 08/24/2016   Trigger finger, right ring finger 08/24/2016   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   Increased risk of breast cancer 07/06/2015   IDDM (insulin dependent diabetes mellitus) 07/06/2015   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  without organ or systems involvement (HCC) 07/06/2015   Sjogren's syndrome (HCC) 07/06/2015   Genetic testing 05/12/2015   Family history of breast cancer 04/15/2015   Fibrocystic breast disease 10/11/2012   GERD 10/01/2009   Chest pain with moderate risk for cardiac etiology 09/02/2009   DYSPHAGIA 09/02/2009   ANXIETY 08/15/2009   ULCER-GASTRIC 08/15/2009   CONSTIPATION 08/15/2009   SLOW TRANSIT CONSTIPATION 08/15/2009   FLATULENCE ERUCTATION AND GAS PAIN 08/15/2009    PCP: Adrian Prince, MD  REFERRING PROVIDER: Adrian Prince, MD   REFERRING DIAG:  M50.30 (ICD-10-CM) - DDD (degenerative disc disease), cervical  [phone] (ICD-10-CM) -  Trapezius muscle spasm   THERAPY DIAG:  Cervicalgia  Cramp and spasm  Rationale for Evaluation and Treatment: Rehabilitation  ONSET DATE: chronic   SUBJECTIVE:                                                                                                                                                                                                         SUBJECTIVE STATEMENT: [name] reported a very stressful couple of weeks since last session after getting father diagnosed with LB dementia and him having recent fall, so a lot more neck pain and muscle tightness.   Hand dominance: Right  PERTINENT HISTORY:  Chronic low back pain with radiculopathy, myofascial trigger point injections every 6 weeks, back ablation, scoliosis, osteoporosis, RA, Type 1 DM, neuropathy, Sjogren's syndrome   PAIN:  Are you having pain? Yes: NPRS scale: 9/10 Pain location: R Upper trap region, R side neck Pain description: tight, like a brick, headache, into her eye Aggravating factors: not sure, always tight Relieving factors: tries all the stretches, in past PT and TrDN has helped significantly  PRECAUTIONS: None  RED FLAGS: None     WEIGHT BEARING RESTRICTIONS: No  FALLS:  Has patient fallen in last 6 months? No  LIVING ENVIRONMENT: Lives with: lives with their spouse Lives in: House/apartment Stairs: No Has following equipment at home: None  OCCUPATION: retired  PLOF: Independent  PATIENT GOALS: get rid of headache and decrease neck pain  NEXT MD VISIT:   OBJECTIVE:   DIAGNOSTIC FINDINGS:  MRI Cervical Spine 01/12/2022  IMPRESSION: 1. C4-C5 mild spinal canal stenosis and mild right neural foraminal narrowing. 2. C6-C7 moderate right and mild left neural foraminal narrowing. 3. C3-C4 mild right neural foraminal narrowing. MRI Lumbar spine 01/12/2022  IMPRESSION: 1. L1-L2 mild right neural foraminal narrowing. Narrowing of the right lateral recess at this level could  affect the descending right L2 nerve roots. 2. Multilevel facet arthropathy,  which can be a cause of back pain.  PATIENT SURVEYS:  NDI 34/50= 68%  COGNITION: Overall cognitive status: Within functional limits for tasks assessed  SENSATION: Severe neuropathy bil feet, mild neuropathy in hands, sensations intact to light touch in hands, denies numbness or tingling.  POSTURE: rounded shoulders and decreased lumbar lordosis  PALPATION: Tightness/tenderness throughout cervical paraspinals, R UT/levator scapulae   CERVICAL ROM:   Active ROM A/PROM (deg) eval 02/22/23 AROM  Flexion 35p! 35  Extension 30 30  Right lateral flexion 30p! 30  Left lateral flexion 30p! 30  Right rotation 50p! 60  Left rotation 60 60   (Blank rows = not tested)  UPPER EXTREMITY ROM: full symmetric AROM for both shoulders, reports history of RA and chronic L shoulder pain  UPPER EXTREMITY MMT:  no deficits in bil UE  CERVICAL SPECIAL TESTS:  Spurling's test: Negative and Sharp pursor's test: Negative  TODAY'S TREATMENT:                                                                                                                              DATE:  03/16/23 Manual Therapy: to decrease muscle spasm and pain and improve mobility STM/TPR to cervical paraspinals, bil UT, skilled palpation and monitoring during dry needling.  In supine gentle PA mobs, NAGS into rotation and cervical traction.  Trigger Point Dry-Needling  Treatment instructions: Expect mild to moderate muscle soreness. S/S of pneumothorax if dry needled over a lung field, and to seek immediate medical attention should they occur. Patient verbalized understanding of these instructions and education. Patient Consent Given: Yes Education handout provided: Previously provided Muscles treated: bil UT, L/S, bil cervical multifidi C3-C6 Electrical stimulation performed: No Parameters: N/A Treatment response/outcome: Twitch Response Elicited and  Palpable Increase in Muscle Length Modalities: MHP to neck in supine x 10 min (not included in treatment time)  03/01/23  Manual Cervical Traction 10 min at 12# Self Care: Discussion of different self traction units and recommendations Manual Therapy: to decrease muscle spasm and pain and improve mobility STM/TPR to bil UT/LS, skilled palpation and monitoring during dry needling. Trigger Point Dry-Needling  Treatment instructions: Expect mild to moderate muscle soreness. S/S of pneumothorax if dry needled over a lung field, and to seek immediate medical attention should they occur. Patient verbalized understanding of these instructions and education. Patient Consent Given: Yes Education handout provided: Previously provided Muscles treated: bil UT/LS Electrical stimulation performed: No Parameters: N/A Treatment response/outcome: Twitch Response Elicited and Palpable Increase in Muscle Length  02/22/2023 Therapeutic Exercise: to improve strength and mobility.  Demo, verbal and tactile cues throughout for technique. Review of HEP  Resisted chin tuck - RTB x 10  Shoulder ER with RTB x 10  Shoulder rows RTB x 10  Shoulder extension RTB x 10 SCM stretch with finger lock Masseter self massage Manual Therapy: to decrease muscle spasm and pain and improve mobility STM/TPR to cervical paraspinals, gentle PA mobs, NAGS into  rotation and cervical traction.    PATIENT EDUCATION:  Education details: continue HEP  Person educated: Patient Education method: Explanation Education comprehension: verbalized understanding  HOME EXERCISE PROGRAM: Access Code: [email or web address]   Access Code: [email or web address] URL: [email or web address] Date: 02/22/2023 Prepared by: Harrie Foreman  Exercises - Cervical Retraction with Resistance  - 1 x daily - 7 x weekly - 1 sets - 5-10 reps - 5-10 sec  hold - Sternocleidomastoid Stretch  - 1 x daily - 7 x weekly - External Masseter Mobilization  - 1 x daily -  7 x weekly - Supine Chest Stretch on Foam Roll  - 1 x daily - 7 x weekly - 1 sets - 1 reps - 1 min hold  - use pool noodle - Shoulder External Rotation and Scapular Retraction with Resistance  - 1 x daily - 7 x weekly - 1-2 sets - 10 reps - Shoulder extension with resistance - Neutral  - 1 x daily - 7 x weekly - 1-2 sets - 10 reps - Standing Shoulder Row with Anchored Resistance  - 1 x daily - 7 x weekly - 1-2 sets - 10 reps   ASSESSMENT:  CLINICAL IMPRESSION: [name] reported significantly more stress and as a result increased pain and muscle tension, so focused on manual therapy today, noted more difficulty even inserting needles due to muscle tightness.  Reported decreased pain and tightness following interventions.   [name] continues to demonstrate potential for improvement and would benefit from continued skilled therapy to address impairments.      OBJECTIVE IMPAIRMENTS: decreased ROM, hypomobility, increased fascial restrictions, increased muscle spasms, postural dysfunction, and pain.   ACTIVITY LIMITATIONS: carrying, bending, and sleeping  PARTICIPATION LIMITATIONS: interpersonal relationship and driving  PERSONAL FACTORS: Age, Past/current experiences, Time since onset of injury/illness/exacerbation, and 3+ comorbidities: Chronic low back pain with radiculopathy, myofascial trigger point injections every 6 weeks, back ablation, scoliosis, osteoporosis, RA, Type 1 DM, neuropathy, Sjogren's syndrome  are also affecting patient's functional outcome.   REHAB POTENTIAL: Good  CLINICAL DECISION MAKING: Evolving/moderate complexity  EVALUATION COMPLEXITY: Moderate   GOALS: Goals reviewed with patient? Yes  SHORT TERM GOALS: Target date: 01/19/2023   Patient will be independent with initial HEP.  Baseline: needs review Goal status: MET 01/20/23  LONG TERM GOALS: Target date: 03/30/2023   Patient will be independent with advanced/ongoing HEP to improve outcomes and  carryover.  Baseline:  Goal status: IN PROGRESS 02/22/23 - met for current, progressed.   2.  Patient will report 75% improvement in neck pain to improve QOL.  Baseline:  Goal status: IN PROGRESS 02/22/2023 - 50% improvement.   3.  Patient will demonstrate full pain free cervical ROM for safety with driving.  Baseline: see objective Goal status: IN PROGRESS 02/22/23 - tight, but not painful, see objective.   4.  Patient will report 7 points or more improvement on NDI to demonstrate improved functional ability.  Baseline: 34/50 Goal status: IN PROGRESS  5.  Patient will report 75% improvement in frequency and intensity of headaches. Baseline:  Goal status: IN PROGRESS 01/20/23- headache has improved significantly  02/22/23- 70% improvement.    PLAN:  PT FREQUENCY: 2x/week for 6 weeks, then 1x/week  PT DURATION: 12 weeks  PLANNED INTERVENTIONS: Therapeutic exercises, Therapeutic activity, Neuromuscular re-education, Balance training, Gait training, Patient/Family education, Self Care, Joint mobilization, Dry Needling, Electrical stimulation, Spinal manipulation, Spinal mobilization, Cryotherapy, Moist heat, Taping, Traction, Ultrasound, Manual therapy, and Re-evaluation  PLAN FOR NEXT SESSION: continue to progress  cervical strengthening, posterior shoulder strengthening as well as stretches, continue manual therapy and TrDN, cervical traction.   Jena Gauss, PT, DPT  03/16/2023, 11:58 AM  PHYSICAL THERAPY DISCHARGE SUMMARY  Visits from Start of Care: 10  Current functional level related to goals / functional outcomes: See above   Remaining deficits: See above   Education / Equipment: HEP  Plan: Patient agrees to discharge.  Patient goals were not met. Patient is being discharged by request due to family health situation.   She will request new order when ready to return.      Jena Gauss, PT, DPT 05/13/2023 8:45 AM

## 2023-03-21 DIAGNOSIS — E109 Type 1 diabetes mellitus without complications: Secondary | ICD-10-CM | POA: Diagnosis not present

## 2023-03-22 ENCOUNTER — Encounter: Payer: Medicare Other | Admitting: Physical Therapy

## 2023-03-23 ENCOUNTER — Ambulatory Visit: Payer: Medicare Other | Admitting: Physical Therapy

## 2023-03-28 DIAGNOSIS — E109 Type 1 diabetes mellitus without complications: Secondary | ICD-10-CM | POA: Diagnosis not present

## 2023-04-04 DIAGNOSIS — E109 Type 1 diabetes mellitus without complications: Secondary | ICD-10-CM | POA: Diagnosis not present

## 2023-04-11 ENCOUNTER — Other Ambulatory Visit: Payer: Self-pay | Admitting: *Deleted

## 2023-04-11 DIAGNOSIS — G518 Other disorders of facial nerve: Secondary | ICD-10-CM | POA: Diagnosis not present

## 2023-04-11 DIAGNOSIS — M791 Myalgia, unspecified site: Secondary | ICD-10-CM | POA: Diagnosis not present

## 2023-04-11 DIAGNOSIS — Z79899 Other long term (current) drug therapy: Secondary | ICD-10-CM

## 2023-04-11 DIAGNOSIS — M542 Cervicalgia: Secondary | ICD-10-CM | POA: Diagnosis not present

## 2023-04-11 DIAGNOSIS — G43719 Chronic migraine without aura, intractable, without status migrainosus: Secondary | ICD-10-CM | POA: Diagnosis not present

## 2023-04-11 MED ORDER — ENBREL SURECLICK 50 MG/ML ~~LOC~~ SOAJ: SUBCUTANEOUS | 0 refills | Status: DC

## 2023-04-11 NOTE — Telephone Encounter (Signed)
Last Fill: 01/10/2023  Labs: 02/23/2023 Glucose is elevated 249  Rest of CMP WNL. CBC stable.  TB Gold: 07/19/2022 Neg    Next Visit: 05/25/2023  Last Visit: 12/16/2022  DX: Sjogren's syndrome with keratoconjunctivitis sicca   Current Dose per office note 12/16/2022: Enbrel 50 mg sq injections once every 10 days-spacing due to recurrent infections.    Patient is requesting to return to every week instead of every 10 days due to finger swelling.  Okay to refill Enbrel?

## 2023-04-11 NOTE — Telephone Encounter (Signed)
Patient contacted the office to request a medication refill.   1. Name of Medication: Enbrel  2. How are you currently taking this medication (dosage and times per day)? SQ once weekly   3. What pharmacy would you like for that to be sent to? [facility]

## 2023-04-13 ENCOUNTER — Ambulatory Visit: Payer: Medicare Other | Admitting: Physical Therapy

## 2023-04-14 DIAGNOSIS — I129 Hypertensive chronic kidney disease with stage 1 through stage 4 chronic kidney disease, or unspecified chronic kidney disease: Secondary | ICD-10-CM | POA: Diagnosis not present

## 2023-04-14 DIAGNOSIS — I10 Essential (primary) hypertension: Secondary | ICD-10-CM | POA: Diagnosis not present

## 2023-04-14 DIAGNOSIS — Z1231 Encounter for screening mammogram for malignant neoplasm of breast: Secondary | ICD-10-CM | POA: Diagnosis not present

## 2023-04-14 DIAGNOSIS — Z1389 Encounter for screening for other disorder: Secondary | ICD-10-CM | POA: Diagnosis not present

## 2023-04-14 DIAGNOSIS — Z0000 Encounter for general adult medical examination without abnormal findings: Secondary | ICD-10-CM | POA: Diagnosis not present

## 2023-04-14 DIAGNOSIS — E1040 Type 1 diabetes mellitus with diabetic neuropathy, unspecified: Secondary | ICD-10-CM | POA: Diagnosis not present

## 2023-04-15 ENCOUNTER — Encounter: Payer: Self-pay | Admitting: Obstetrics and Gynecology

## 2023-05-04 DIAGNOSIS — H04123 Dry eye syndrome of bilateral lacrimal glands: Secondary | ICD-10-CM | POA: Diagnosis not present

## 2023-05-04 DIAGNOSIS — Z961 Presence of intraocular lens: Secondary | ICD-10-CM | POA: Diagnosis not present

## 2023-05-11 DIAGNOSIS — E1040 Type 1 diabetes mellitus with diabetic neuropathy, unspecified: Secondary | ICD-10-CM | POA: Diagnosis not present

## 2023-05-11 DIAGNOSIS — Z794 Long term (current) use of insulin: Secondary | ICD-10-CM | POA: Diagnosis not present

## 2023-05-11 NOTE — Progress Notes (Unsigned)
Office Visit Note  [name]             Date of Birth: 1955-08-05           MRN: [id number]             PCP: Adrian Prince, MD Referring: Adrian Prince, MD Visit Date: 05/25/2023 Occupation: @GUAROCC @  Subjective:  Right hand pain   History of Present Illness: Becky [name] is a 67 y.o. female with history of sjogren's syndrome and rheumatoid arthritis.  Patient is currently prescribed Enbrel 50 mg subcutaneous injections once weekly.  Patient states she was previously spacing about every 10 days but over the past 6 weeks has had to increase frequency of Enbrel dosing to every week due to experiencing increased pain, swelling, and stiffness in her right hand.  Patient states that she has been injecting Enbrel on Tuesdays and by the weekend she is having breakthrough symptoms involving her right hand.  Patient states that her pain earlier in the week is about a 6 out of 10 but by the weekend it is an 8 out of 10.  She has had difficulty making complete fist due to the pain and stiffness.  She denies any other signs or symptoms of a flare.  She continues to have intermittent arthralgias in both knees and would like to reapply for viscosupplementation which has helped to alleviate her knee joint pain in the past.  She has not been taking any over-the-counter products for pain relief.  Patient states that overall her sicca symptoms have been stable.  She continues to see the ophthalmologist and dentist on a regular basis as advised.    Activities of Daily Living:  Patient reports morning stiffness for 2-3 hours.   Patient Denies nocturnal pain.  Difficulty dressing/grooming: Denies Difficulty climbing stairs: Reports Difficulty getting out of chair: Reports Difficulty using hands for taps, buttons, cutlery, and/or writing: Reports  Review of Systems  Constitutional:  Positive for fatigue.  HENT:  Positive for mouth dryness. Negative for mouth sores.   Eyes:  Positive  for dryness.  Respiratory:  Negative for shortness of breath.   Cardiovascular:  Negative for chest pain and palpitations.  Gastrointestinal:  Negative for blood in stool, constipation and diarrhea.  Endocrine: Negative for increased urination.  Genitourinary:  Negative for involuntary urination.  Musculoskeletal:  Positive for joint pain, joint pain, joint swelling, myalgias, morning stiffness, muscle tenderness and myalgias. Negative for gait problem and muscle weakness.  Skin:  Positive for rash and hair loss. Negative for color change and sensitivity to sunlight.  Allergic/Immunologic: Negative for susceptible to infections.  Neurological:  Negative for dizziness and headaches.  Hematological:  Negative for swollen glands.  Psychiatric/Behavioral:  Negative for depressed mood and sleep disturbance. The patient is nervous/anxious.     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 determined by examination associated with type 1 diabetes mellitus (HCC) 03/18/2020   Stable treated proliferative diabetic retinopathy of right eye determined by examination associated with type 2 diabetes mellitus (HCC) 03/18/2020   History of vitrectomy 03/18/2020   Complicated UTI (  urinary tract infection) 12/13/2018   Failure of outpatient treatment 12/11/2018   Acute metabolic encephalopathy 12/11/2018   Fever 12/10/2018   At high risk for breast cancer 05/09/2018   Eosinophilia 01/31/2017   Iritis 01/04/2017   History of joint swelling 08/24/2016   Osteoarthritis of knees, bilateral  08/24/2016   History of migraine 08/24/2016   History of depression 08/24/2016   History of osteoporosis 08/24/2016   DDD (degenerative disc disease), lumbar 08/24/2016   High risk medication use 08/24/2016   Trigger finger, left middle finger 08/24/2016   Trigger finger, right ring finger 08/24/2016   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   Increased risk of breast cancer 07/06/2015   IDDM (insulin dependent diabetes mellitus) 07/06/2015   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 Cateracts bil eyes removed   Eczema    GERD (gastroesophageal reflux disease)    Heart murmur    History of COVID-19 06/14/2020   Hypercholesterolemia    IDDM (insulin dependent diabetes mellitus) 1969   Migraine headache    Neuromuscular disorder (HCC)    bil legs & feet - neuropathy   Neuropathy    Osteoporosis    10/2012   Otosclerosis    Pigmented purpura (HCC)    RA (rheumatoid arthritis) (HCC) 2000   S/P cardiac cath 01/2012   Normal coronaries and normal LV function.   Sjoegren syndrome     Family History  Problem Relation Age of Onset   Breast cancer Mother 37   Colon polyps Mother        unspecified number   Uterine cancer Mother        metastatic   Endometrial cancer Mother 72   Stroke Father 109   Dementia Father    Rheum arthritis Sister    Breast cancer Sister 48       lump; (-)GT  (breast/ova ca panel, GeneDx)   Breast cancer Maternal Grandmother 30   Skin cancer Maternal Grandfather        unspecified type   Prostate cancer Maternal Grandfather        dx. late 12s   Breast cancer Cousin        bilateral; dx. late 90s   Breast cancer Other        dx. in their late 60s   Breast cancer Other        dx. 78-79   Colon cancer Neg Hx    Esophageal cancer Neg Hx    Rectal cancer Neg Hx    Stomach cancer Neg Hx    Past Surgical History:  Procedure Laterality Date   back ablation  03/2020   back ablation  10/09/2020   BREAST LUMPECTOMY  1995   CARPAL TUNNEL RELEASE  2000   CATARACT EXTRACTION Right 09-05-12   CATARACT EXTRACTION Left 3/15   CESAREAN SECTION  1987   COLONOSCOPY  EYE SURGERY     diabetic retinopathy    HYSTEROSCOPY  02/2001   and D & C   LYMPH NODE DISSECTION  2002   TOE SURGERY  2000   bone removed   Social History   Social History Narrative   Not on file   Immunization History  Administered Date(s) Administered   PFIZER(Purple Top)SARS-COV-2 Vaccination 07/11/2019, 08/01/2019, 02/05/2020, 09/15/2020   Tdap 06/21/2013     Objective: Vital Signs: BP 112/75 (BP Location: Right Arm, Patient Position: Sitting, Cuff Size: Normal)   Pulse 91   Resp 14   Ht 5\' 4"  (1.626 m)   Wt 126 lb 9.6 oz (57.4 kg)   LMP 09/19/2008   BMI 21.73 kg/m    Physical Exam Vitals and nursing note reviewed.  Constitutional:      Appearance: She is well-developed.  HENT:     Head: Normocephalic and atraumatic.  Eyes:     Conjunctiva/sclera: Conjunctivae normal.  Cardiovascular:     Rate and Rhythm: Normal rate and regular rhythm.     Heart sounds: Normal heart sounds.  Pulmonary:     Effort: Pulmonary effort is normal.     Breath sounds: Normal breath sounds.  Abdominal:     General: Bowel sounds are normal.     Palpations: Abdomen is soft.  Musculoskeletal:     Cervical back: Normal range of motion.  Lymphadenopathy:     Cervical: No  cervical adenopathy.  Skin:    General: Skin is warm and dry.     Capillary Refill: Capillary refill takes less than 2 seconds.  Neurological:     Mental Status: She is alert and oriented to person, place, and time.  Psychiatric:        Behavior: Behavior normal.      Musculoskeletal Exam: C-spine limited ROM with lateral rotation.  Shoulder joints have good ROM with no discomfort.  Elbow joints and wrist joints have good ROM. Tenderness of the right wrist.  Tenderness of the right 1st-3rd MCP and PIP joints.  Inflammation in the right 1st MCP joint.  Erythema of the right 2nd and 3rd MCP joints.  PIP and DIP thickening.  Hip joints have good ROM.  Knee joints have good ROM with no warmth or effusion.  Ankle joints have good ROM with no tenderness or joint swelling.   CDAI Exam: CDAI Score: -- Patient Global: --; Provider Global: -- Swollen: --; Tender: -- Joint Exam 05/25/2023   No joint exam has been documented for this visit   There is currently no information documented on the homunculus. Go to the Rheumatology activity and complete the homunculus joint exam.  Investigation: No additional findings.  Imaging: No results found.  Recent Labs: Lab Results  Component Value Date   WBC 5.9 02/23/2023   HGB 14.2 02/23/2023   PLT 279 02/23/2023   NA 140 02/23/2023   K 4.2 02/23/2023   CL 105 02/23/2023   CO2 26 02/23/2023   GLUCOSE 249 (H) 02/23/2023   BUN 11 02/23/2023   CREATININE 0.98 02/23/2023   BILITOT 0.5 02/23/2023   ALKPHOS 79 05/13/2020   AST 31 02/23/2023   ALT 19 02/23/2023   PROT 6.9 02/23/2023   ALBUMIN 3.9 05/13/2020   CALCIUM 9.7 02/23/2023   GFRAA 66 12/17/2020   QFTBGOLD NEGATIVE 04/06/2017   QFTBGOLDPLUS NEGATIVE 07/19/2022    Speciality Comments: Recieves Enbrel [employer] through [employer]. Prolia start 01/12/21  Procedures:  No procedures performed Allergies: Chlorhexidine, Denosumab, Macrobid [  nitrofurantoin macrocrystal],  Molnupiravir, Ciprofloxacin, and Penicillins     Assessment / Plan:     Visit Diagnoses: Sjogren's syndrome with keratoconjunctivitis sicca (HCC) - Patient continues to have chronic sicca symptoms.  Overall her symptoms have been stable.  She remains under close follow-up with ophthalmology--reviewed office visit note from 05/04/2023.  She continues to use serum drops 4 times daily and a humidifier in her bedroom.  Her mouth dryness has been tolerable as she tries to drink fluids throughout the day.  She continues to see the dentist every 6 months as advised. Discussed the increased risk of developing lymphoma in patients with Sjogren's syndrome.  Plan to obtain the following lab work today for further evaluation.  She is vies notify us if she develops signs or symptoms of a flare.  She will follow-up in the office in 2-3 months.   Plan: CBC with Differential/Platelet, COMPLETE METABOLIC PANEL WITH GFR, QuantiFERON-TB Gold Plus, Sedimentation rate, C3 and C4, Urinalysis, Routine w reflex microscopic, ANA, Serum protein electrophoresis with reflex, C-reactive protein  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 without organ or systems involvement (HCC) - Patient has been experiencing increased pain, swelling, and stiffness involving the right hand for the past 6 weeks.  Patient was previously injecting Enbrel every 10 days but has increased the frequency to once weekly for the past 6 weeks due to the increase in symptoms.  She is been injecting Enbrel on Tuesdays and by the weekend she is having significant breakthrough symptoms to the point that her pain is a 8 out of 10.  She has had difficulty making a complete fist due to the pain and stiffness involving her right hand.  She has inflammation in the right first MCP joint as well as erythema and tenderness in the second and third MCP and PIP joints.  Difficulty making complete fist with the right hand noted today.  Plan to check sed  rate and CRP today.   Different treatment options were discussed today.  Previous therapy includes Orencia, methotrexate, sulfasalazine, and Arava.  Patient had an inadequate response to Plaquenil as monotherapy when she was initially diagnosed with rheumatoid arthritis. Indications, contraindications, potential side effects of Plaquenil were reviewed today in detail.  All questions were addressed and consent was obtained.  Patient plans on reaching out to her ophthalmologist for clearance to initiate Plaquenil.  She will require updated lab work in 1 month and every 3 months.  She will require a baseline Plaquenil examination.  She will notify us if she cannot tolerate taking Plaquenil.  She will remain on Enbrel as combination therapy.  She will follow-up in the office in 2 months or sooner if needed.  Plan: Sedimentation rate, C-reactive protein  Patient was counseled on the purpose, proper use, and adverse effects of hydroxychloroquine including nausea/diarrhea, skin rash, headaches, and sun sensitivity.  Advised patient to wear sunscreen once starting hydroxychloroquine to reduce risk of rash associated with sun sensitivity.  Discussed importance of annual eye exams while on hydroxychloroquine to monitor to ocular toxicity and discussed importance of frequent laboratory monitoring.  Provided patient with eye exam form for baseline ophthalmologic exam.  Provided patient with educational materials on hydroxychloroquine and answered all questions.  Patient consented to hydroxychloroquine. Will upload consent in the media tab.    Reviewed risk for QTC prolongation when used in combination with other QTc prolonging agents (including but not limited to antiarrhythmics, macrolide antibiotics, flouroquinolone antibiotics, haloperidol, quetiapine, olanzapine, risperidone, droperidol, ziprasidone, amitriptyline, citalopram,  ondansetron, migraine triptans, and methadone). EKG shows QTc wnl on 12/09/18.  Dose will  be Plaquenil 200 mg once daily.  Prescription pending lab results.   High risk medication use - Enbrel 50 mg sq injections once every 7 days.  Plan to add on Plaquenil as combination therapy if approved by ophthalmology. CBC and CMP updated on 02/23/23.  Orders for CBC and CMP released today.  TB gold negative on 07/19/22. Previous therapy: Orencia, Methotrexate, sulfasalazine, and Arava.   Inadequate response to plaquenil as monotherapy.   No recent or recurrent infections. Discussed the importance of holding enbrel if she develops signs or symptoms of an infection and to resume once the infection has completely cleared.    - Plan: CBC with Differential/Platelet, COMPLETE METABOLIC PANEL WITH GFR  Iritis: No signs or symptoms of a flare.  Followed closely by ophthalmology.  No active inflammation noted on exam from 05/04/2023.  Trigger thumb, left thumb: Not currently locking.   Primary osteoarthritis of both knees: She has good range of motion of both knee joints on examination today.  No warmth or effusion noted.  Patient underwent viscosupplementation at the end of April/early May 2024.  She started to have some increased discomfort in both knees and would like to reapply for viscosupplementation for both knees.    This patient is diagnosed with osteoarthritis of the knee(s).    Radiographs show evidence of joint space narrowing, osteophytes, subchondral sclerosis and/or subchondral cysts.  This patient has knee pain which interferes with functional and activities of daily living.    This patient has experienced inadequate response, adverse effects and/or intolerance with conservative treatments such as acetaminophen, NSAIDS, topical creams, physical therapy or regular exercise, knee bracing and/or weight loss.   This patient has experienced inadequate response or has a contraindication to intra articular steroid injections for at least 3 months.   This patient is not scheduled to have a  total knee replacement within 6 months of starting treatment with viscosupplementation.  DDD (degenerative disc disease), cervical - Referred to PT at last OV-completed on 03/16/23.   Degeneration of intervertebral disc of lumbar region without discogenic back pain or lower extremity pain: Chronic pain.  No symptoms of radiculopathy at this time.   Age-related osteoporosis without current pathological fracture - DEXA ordered by Dr. Evlyn Kanner.  Prolia injections x3.  Her most recent injection was administered on 07/27/21.  Discontinued Prolia due to side effects.  She is taking a calcium supplement daily.    Other medical conditions are listed as follows:   History of migraine  Other fatigue  History of gastroesophageal reflux (GERD)  History of diabetes mellitus  History of depression  Orders: Orders Placed This Encounter  Procedures   CBC with Differential/Platelet   COMPLETE METABOLIC PANEL WITH GFR   QuantiFERON-TB Gold Plus   Sedimentation rate   C3 and C4   Urinalysis, Routine w reflex microscopic   ANA   Serum protein electrophoresis with reflex   C-reactive protein   No orders of the defined types were placed in this encounter.    Follow-Up Instructions: Return for Rheumatoid arthritis, Sjogren's syndrome.   Gearldine Bienenstock, PA-C  Note - This record has been created using Dragon software.  Chart creation errors have been sought, but may not always  have been located. Such creation errors do not reflect on  the standard of medical care.

## 2023-05-18 DIAGNOSIS — E1040 Type 1 diabetes mellitus with diabetic neuropathy, unspecified: Secondary | ICD-10-CM | POA: Diagnosis not present

## 2023-05-18 DIAGNOSIS — Z794 Long term (current) use of insulin: Secondary | ICD-10-CM | POA: Diagnosis not present

## 2023-05-23 DIAGNOSIS — E1040 Type 1 diabetes mellitus with diabetic neuropathy, unspecified: Secondary | ICD-10-CM | POA: Diagnosis not present

## 2023-05-23 DIAGNOSIS — I129 Hypertensive chronic kidney disease with stage 1 through stage 4 chronic kidney disease, or unspecified chronic kidney disease: Secondary | ICD-10-CM | POA: Diagnosis not present

## 2023-05-25 ENCOUNTER — Encounter: Payer: Self-pay | Admitting: Physician Assistant

## 2023-05-25 ENCOUNTER — Other Ambulatory Visit: Payer: Self-pay

## 2023-05-25 ENCOUNTER — Ambulatory Visit: Payer: Medicare Other | Attending: Physician Assistant | Admitting: Physician Assistant

## 2023-05-25 ENCOUNTER — Telehealth: Payer: Self-pay

## 2023-05-25 VITALS — BP 112/75 | HR 91 | Resp 14 | Ht 64.0 in | Wt 126.6 lb

## 2023-05-25 DIAGNOSIS — M810 Age-related osteoporosis without current pathological fracture: Secondary | ICD-10-CM

## 2023-05-25 DIAGNOSIS — E1040 Type 1 diabetes mellitus with diabetic neuropathy, unspecified: Secondary | ICD-10-CM | POA: Diagnosis not present

## 2023-05-25 DIAGNOSIS — M51369 Other intervertebral disc degeneration, lumbar region without mention of lumbar back pain or lower extremity pain: Secondary | ICD-10-CM

## 2023-05-25 DIAGNOSIS — Z79899 Other long term (current) drug therapy: Secondary | ICD-10-CM

## 2023-05-25 DIAGNOSIS — Z794 Long term (current) use of insulin: Secondary | ICD-10-CM | POA: Diagnosis not present

## 2023-05-25 DIAGNOSIS — M65312 Trigger thumb, left thumb: Secondary | ICD-10-CM

## 2023-05-25 DIAGNOSIS — M0579 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 without organ or systems involvement: Secondary | ICD-10-CM

## 2023-05-25 DIAGNOSIS — H209 Unspecified iridocyclitis: Secondary | ICD-10-CM | POA: Diagnosis not present

## 2023-05-25 DIAGNOSIS — Z8659 Personal history of other mental and behavioral disorders: Secondary | ICD-10-CM

## 2023-05-25 DIAGNOSIS — M170 Bilateral primary osteoarthritis of knee: Secondary | ICD-10-CM

## 2023-05-25 DIAGNOSIS — M5030 Other cervical disc degeneration, unspecified cervical region: Secondary | ICD-10-CM

## 2023-05-25 DIAGNOSIS — M3501 Sicca syndrome with keratoconjunctivitis: Secondary | ICD-10-CM | POA: Diagnosis not present

## 2023-05-25 DIAGNOSIS — Z8639 Personal history of other endocrine, nutritional and metabolic disease: Secondary | ICD-10-CM

## 2023-05-25 DIAGNOSIS — R5383 Other fatigue: Secondary | ICD-10-CM

## 2023-05-25 DIAGNOSIS — Z8719 Personal history of other diseases of the digestive system: Secondary | ICD-10-CM

## 2023-05-25 DIAGNOSIS — Z8669 Personal history of other diseases of the nervous system and sense organs: Secondary | ICD-10-CM

## 2023-05-25 NOTE — Patient Instructions (Addendum)
Standing Labs We placed an order today for your standing lab work.   Please have your standing labs drawn in 1 month then every 3 months   Please have your labs drawn 2 weeks prior to your appointment so that the provider can discuss your lab results at your appointment, if possible.  Please note that you may see your imaging and lab results in MyChart before we have reviewed them. We will contact you once all results are reviewed. Please allow our office up to 72 hours to thoroughly review all of the results before contacting the office for clarification of your results.  WALK-IN LAB HOURS  Monday through Thursday from 8:00 am -12:30 pm and 1:00 pm-5:00 pm and Friday from 8:00 am-12:00 pm.  Patients with office visits requiring labs will be seen before walk-in labs.  You may encounter longer than normal wait times. Please allow additional time. Wait times may be shorter on  Monday and Thursday afternoons.  We do not book appointments for walk-in labs. We appreciate your patience and understanding with our staff.   Labs are drawn by [facility]. Please bring your co-pay at the time of your lab draw.  You may receive a bill from [facility] for your lab work.  Please note if you are on Hydroxychloroquine and and an order has been placed for a Hydroxychloroquine level,  you will need to have it drawn 4 hours or more after your last dose.  If you wish to have your labs drawn at another location, please call the office 24 hours in advance so we can fax the orders.  The office is located at [address], [place], [place] [address]   If you have any questions regarding directions or hours of operation,  please call [phone].   As a reminder, please drink plenty of water prior to coming for your lab work. Thanks!   Hydroxychloroquine Tablets What is this medication? HYDROXYCHLOROQUINE (hye drox ee KLOR oh kwin) treats autoimmune conditions, such as rheumatoid arthritis and lupus. It  works by slowing down an overactive immune system. It may also be used to prevent and treat malaria. It works by killing the parasite that causes malaria. It belongs to a group of medications called DMARDs. This medicine may be used for other purposes; ask your health care provider or pharmacist if you have questions. COMMON BRAND NAME(S): Plaquenil, Quineprox, SOVUNA What should I tell my care team before I take this medication? They need to know if you have any of these conditions: Diabetes Eye disease, vision problems Frequently drink alcohol G6PD deficiency Heart disease Irregular heartbeat or rhythm Kidney disease Liver disease Porphyria Psoriasis An unusual or allergic reaction to hydroxychloroquine, other medications, foods, dyes, or preservatives Pregnant or trying to get pregnant Breastfeeding How should I use this medication? Take this medication by mouth with water. Take it as directed on the prescription label. Do not cut, crush, or chew this medication. Swallow the tablets whole. Take it with food. Do not take it more than directed. Take all of this medication unless your care team tells you to stop it early. Keep taking it even if you think you are better. Take products with antacids in them at a different time of day than this medication. Take this medication 4 hours before or 4 hours after antacids. Talk to your care team if you have questions. Talk to your care team about the use of this medication in children. While this medication may be prescribed for  selected conditions, precautions do apply. Overdosage: If you think you have taken too much of this medicine contact a poison control center or emergency room at once. NOTE: This medicine is only for you. Do not share this medicine with others. What if I miss a dose? If you miss a dose, take it as soon as you can. If it is almost time for your next dose, take only that dose. Do not take double or extra doses. What may  interact with this medication? Do not take this medication with any of the following: Cisapride Dronedarone Pimozide Thioridazine This medication may also interact with the following: Ampicillin Antacids Cimetidine Cyclosporine Digoxin Kaolin Medications for diabetes, such as insulin, glipizide, glyburide Medications for seizures, such as carbamazepine, phenobarbital, phenytoin Mefloquine Methotrexate Other medications that cause heart rhythm changes Praziquantel This list may not describe all possible interactions. Give your health care provider a list of all the medicines, herbs, non-prescription drugs, or dietary supplements you use. Also tell them if you smoke, drink alcohol, or use illegal drugs. Some items may interact with your medicine. What should I watch for while using this medication? Visit your care team for regular checks on your progress. Tell your care team if your symptoms do not start to get better or if they get worse. You may need blood work done while you are taking this medication. If you take other medications that can affect heart rhythm, you may need more testing. Talk to your care team if you have questions. Your vision may be tested before and during use of this medication. Tell your care team right away if you have any change in your eyesight. This medication may cause serious skin reactions. They can happen weeks to months after starting the medication. Contact your care team right away if you notice fevers or flu-like symptoms with a rash. The rash may be red or purple and then turn into blisters or peeling of the skin. Or, you might notice a red rash with swelling of the face, lips or lymph nodes in your neck or under your arms. If you or your family notice any changes in your behavior, such as new or worsening depression, thoughts of harming yourself, anxiety, or other unusual or disturbing thoughts, or memory loss, call your care team right away. What side  effects may I notice from receiving this medication? Side effects that you should report to your care team as soon as possible: Allergic reactions--skin rash, itching, hives, swelling of the face, lips, tongue, or throat Aplastic anemia--unusual weakness or fatigue, dizziness, headache, trouble breathing, increased bleeding or bruising Change in vision Heart rhythm changes--fast or irregular heartbeat, dizziness, feeling faint or lightheaded, chest pain, trouble breathing Infection--fever, chills, cough, or sore throat Low blood sugar (hypoglycemia)--tremors or shaking, anxiety, sweating, cold or clammy skin, confusion, dizziness, rapid heartbeat Muscle injury--unusual weakness or fatigue, muscle pain, dark yellow or brown urine, decrease in amount of urine Pain, tingling, or numbness in the hands or feet Rash, fever, and swollen lymph nodes Redness, blistering, peeling, or loosening of the skin, including inside the mouth Thoughts of suicide or self-harm, worsening mood, or feelings of depression Unusual bruising or bleeding Side effects that usually do not require medical attention (report to your care team if they continue or are bothersome): Diarrhea Headache Nausea Stomach pain Vomiting This list may not describe all possible side effects. Call your doctor for medical advice about side effects. You may report side effects to FDA at 1-800-FDA-1088. Where should  I keep my medication? Keep out of the reach of children and pets. Store at room temperature up to 30 degrees C (86 degrees F). Protect from light. Get rid of any unused medication after the expiration date. To get rid of medications that are no longer needed or have expired: Take the medication to a medication take-back program. Check with your pharmacy or law enforcement to find a location. If you cannot return the medication, check the label or package insert to see if the medication should be thrown out in the garbage or  flushed down the toilet. If you are not sure, ask your care team. If it is safe to put it in the trash, empty the medication out of the container. Mix the medication with cat litter, dirt, coffee grounds, or other unwanted substance. Seal the mixture in a bag or container. Put it in the trash. NOTE: This sheet is a summary. It may not cover all possible information. If you have questions about this medicine, talk to your doctor, pharmacist, or health care provider.  2024 [employer]vier/Gold Standard (2021-12-14 00:00:00)

## 2023-05-25 NOTE — Telephone Encounter (Signed)
Patient was consented on plaquenil today at the office visit and will discuss the medication with her ophthalmologist. Patient will contact us with her decision about proceeding with taking the plaquenil.   Consent obtained and sent the scan center. Thanks!

## 2023-05-25 NOTE — Telephone Encounter (Signed)
Please apply for bilateral knee visco, per Sherron Ales, PA-C. Thanks!   Patient is aware it will be after 06/22/2023 before we can apply through insurance.

## 2023-05-26 NOTE — Progress Notes (Signed)
ESR and CRP WNL  Complements WNL CBC WNL  Glucose  was significantly elevated-303. Creatinine is slightly elevated-1.06 and GFR is low-58. Avoid the use of NSAIDs.   Urine +for glucose and trace ketones. No proteinuria noted.

## 2023-05-29 NOTE — Progress Notes (Signed)
SPEP did not reveal any abnormal protein bands.

## 2023-05-30 LAB — CBC WITH DIFFERENTIAL/PLATELET
Absolute Lymphocytes: 1742 {cells}/uL (ref 850–3900)
Absolute Monocytes: 409 {cells}/uL (ref 200–950)
Basophils Absolute: 112 {cells}/uL (ref 0–200)
Basophils Relative: 1.7 %
Eosinophils Absolute: 429 {cells}/uL (ref 15–500)
Eosinophils Relative: 6.5 %
HCT: 43.4 % (ref 35.0–45.0)
Hemoglobin: 14.4 g/dL (ref 11.7–15.5)
MCH: 30.3 pg (ref 27.0–33.0)
MCHC: 33.2 g/dL (ref 32.0–36.0)
MCV: 91.4 fL (ref 80.0–100.0)
MPV: 13.3 fL — ABNORMAL HIGH (ref 7.5–12.5)
Monocytes Relative: 6.2 %
Neutro Abs: 3907 {cells}/uL (ref 1500–7800)
Neutrophils Relative %: 59.2 %
Platelets: 280 10*3/uL (ref 140–400)
RBC: 4.75 10*6/uL (ref 3.80–5.10)
RDW: 12.9 % (ref 11.0–15.0)
Total Lymphocyte: 26.4 %
WBC: 6.6 10*3/uL (ref 3.8–10.8)

## 2023-05-30 LAB — PROTEIN ELECTROPHORESIS, SERUM, WITH REFLEX
Albumin ELP: 4.1 g/dL (ref 3.8–4.8)
Alpha 1: 0.2 g/dL (ref 0.2–0.3)
Alpha 2: 0.8 g/dL (ref 0.5–0.9)
Beta 2: 0.5 g/dL (ref 0.2–0.5)
Beta Globulin: 0.4 g/dL (ref 0.4–0.6)
Gamma Globulin: 1 g/dL (ref 0.8–1.7)
Total Protein: 7.1 g/dL (ref 6.1–8.1)

## 2023-05-30 LAB — COMPLETE METABOLIC PANEL WITH GFR
AG Ratio: 1.3 (calc) (ref 1.0–2.5)
ALT: 16 U/L (ref 6–29)
AST: 21 U/L (ref 10–35)
Albumin: 4 g/dL (ref 3.6–5.1)
Alkaline phosphatase (APISO): 77 U/L (ref 37–153)
BUN/Creatinine Ratio: 16 (calc) (ref 6–22)
BUN: 17 mg/dL (ref 7–25)
CO2: 25 mmol/L (ref 20–32)
Calcium: 9.5 mg/dL (ref 8.6–10.4)
Chloride: 105 mmol/L (ref 98–110)
Creat: 1.06 mg/dL — ABNORMAL HIGH (ref 0.50–1.05)
Globulin: 3.1 g/dL (ref 1.9–3.7)
Glucose, Bld: 313 mg/dL — ABNORMAL HIGH (ref 65–99)
Potassium: 4.3 mmol/L (ref 3.5–5.3)
Sodium: 139 mmol/L (ref 135–146)
Total Bilirubin: 0.5 mg/dL (ref 0.2–1.2)
Total Protein: 7.1 g/dL (ref 6.1–8.1)
eGFR: 58 mL/min/{1.73_m2} — ABNORMAL LOW (ref >=60)

## 2023-05-30 LAB — URINALYSIS, ROUTINE W REFLEX MICROSCOPIC
Bilirubin Urine: NEGATIVE
Hgb urine dipstick: NEGATIVE
Leukocytes,Ua: NEGATIVE
Nitrite: NEGATIVE
Protein, ur: NEGATIVE
Specific Gravity, Urine: 1.02 (ref 1.001–1.035)
pH: 7 (ref 5.0–8.0)

## 2023-05-30 LAB — C3 AND C4
C3 Complement: 93 mg/dL (ref 83–193)
C4 Complement: 17 mg/dL (ref 15–57)

## 2023-05-30 LAB — ANA: Anti Nuclear Antibody (ANA): NEGATIVE

## 2023-05-30 LAB — SEDIMENTATION RATE: Sed Rate: 9 mm/h (ref 0–30)

## 2023-05-30 LAB — C-REACTIVE PROTEIN: CRP: <3 mg/L (ref <8.0)

## 2023-05-31 DIAGNOSIS — G43719 Chronic migraine without aura, intractable, without status migrainosus: Secondary | ICD-10-CM | POA: Diagnosis not present

## 2023-05-31 DIAGNOSIS — G518 Other disorders of facial nerve: Secondary | ICD-10-CM | POA: Diagnosis not present

## 2023-05-31 DIAGNOSIS — M542 Cervicalgia: Secondary | ICD-10-CM | POA: Diagnosis not present

## 2023-05-31 DIAGNOSIS — M791 Myalgia, unspecified site: Secondary | ICD-10-CM | POA: Diagnosis not present

## 2023-05-31 NOTE — Progress Notes (Signed)
ANA is negative.

## 2023-05-31 NOTE — Telephone Encounter (Signed)
Patient states she has not been in touch with her PLQ eye exam. Patient states she was unsure if she really wanted to take the PLQ. Discussed patient's concerns and answered questions. Patient will each out to her ophthalmologist today and call back to advise if she wants to take medication.

## 2023-06-01 DIAGNOSIS — Z794 Long term (current) use of insulin: Secondary | ICD-10-CM | POA: Diagnosis not present

## 2023-06-01 DIAGNOSIS — E1040 Type 1 diabetes mellitus with diabetic neuropathy, unspecified: Secondary | ICD-10-CM | POA: Diagnosis not present

## 2023-06-02 MED ORDER — HYDROXYCHLOROQUINE SULFATE 200 MG PO TABS: 200.0000 mg | ORAL_TABLET | Freq: Every day | ORAL | 2 refills | Status: DC

## 2023-06-02 NOTE — Telephone Encounter (Signed)
Patient called the office and states she spoke with Dr. Valere Dross and he is okay with her starting the PLQ. Patient is ready to proceed with PLQ and would like it sent to [facility]. Patient will set up her baseline PLQ eye exam.   Dose will be Plaquenil 200 mg once daily.

## 2023-06-02 NOTE — Addendum Note (Signed)
Addended by: Henriette Combs on: 06/02/2023 08:28 AM   Modules accepted: Orders

## 2023-06-08 DIAGNOSIS — E1040 Type 1 diabetes mellitus with diabetic neuropathy, unspecified: Secondary | ICD-10-CM | POA: Diagnosis not present

## 2023-06-08 DIAGNOSIS — Z794 Long term (current) use of insulin: Secondary | ICD-10-CM | POA: Diagnosis not present

## 2023-06-15 DIAGNOSIS — Z794 Long term (current) use of insulin: Secondary | ICD-10-CM | POA: Diagnosis not present

## 2023-06-15 DIAGNOSIS — E1040 Type 1 diabetes mellitus with diabetic neuropathy, unspecified: Secondary | ICD-10-CM | POA: Diagnosis not present

## 2023-06-22 DIAGNOSIS — Z794 Long term (current) use of insulin: Secondary | ICD-10-CM | POA: Diagnosis not present

## 2023-06-22 DIAGNOSIS — E1040 Type 1 diabetes mellitus with diabetic neuropathy, unspecified: Secondary | ICD-10-CM | POA: Diagnosis not present

## 2023-06-23 NOTE — Telephone Encounter (Signed)
 VOB submitted for Orthovisc, Bilateral knee(s) BV pending

## 2023-06-27 DIAGNOSIS — K080 Exfoliation of teeth due to systemic causes: Secondary | ICD-10-CM | POA: Diagnosis not present

## 2023-06-29 DIAGNOSIS — E1040 Type 1 diabetes mellitus with diabetic neuropathy, unspecified: Secondary | ICD-10-CM | POA: Diagnosis not present

## 2023-06-29 DIAGNOSIS — Z794 Long term (current) use of insulin: Secondary | ICD-10-CM | POA: Diagnosis not present

## 2023-07-06 ENCOUNTER — Other Ambulatory Visit: Payer: Self-pay | Admitting: *Deleted

## 2023-07-06 DIAGNOSIS — Z79899 Other long term (current) drug therapy: Secondary | ICD-10-CM

## 2023-07-06 DIAGNOSIS — M3501 Sicca syndrome with keratoconjunctivitis: Secondary | ICD-10-CM

## 2023-07-06 DIAGNOSIS — E1040 Type 1 diabetes mellitus with diabetic neuropathy, unspecified: Secondary | ICD-10-CM | POA: Diagnosis not present

## 2023-07-06 DIAGNOSIS — Z794 Long term (current) use of insulin: Secondary | ICD-10-CM | POA: Diagnosis not present

## 2023-07-07 NOTE — Progress Notes (Signed)
Absolute eosinophils are elevated-recent exacerbation of allergies?  Rest of CBC WNL.  We will continue to monitor.  CMP WNL.

## 2023-07-09 LAB — CBC WITH DIFFERENTIAL/PLATELET
Absolute Lymphocytes: 1546 {cells}/uL (ref 850–3900)
Absolute Monocytes: 523 {cells}/uL (ref 200–950)
Basophils Absolute: 99 {cells}/uL (ref 0–200)
Basophils Relative: 1.8 %
Eosinophils Absolute: 765 {cells}/uL — ABNORMAL HIGH (ref 15–500)
Eosinophils Relative: 13.9 %
HCT: 44.3 % (ref 35.0–45.0)
Hemoglobin: 14.2 g/dL (ref 11.7–15.5)
MCH: 29.3 pg (ref 27.0–33.0)
MCHC: 32.1 g/dL (ref 32.0–36.0)
MCV: 91.5 fL (ref 80.0–100.0)
MPV: 13.9 fL — ABNORMAL HIGH (ref 7.5–12.5)
Monocytes Relative: 9.5 %
Neutro Abs: 2569 {cells}/uL (ref 1500–7800)
Neutrophils Relative %: 46.7 %
Platelets: 274 10*3/uL (ref 140–400)
RBC: 4.84 10*6/uL (ref 3.80–5.10)
RDW: 12.9 % (ref 11.0–15.0)
Total Lymphocyte: 28.1 %
WBC: 5.5 10*3/uL (ref 3.8–10.8)

## 2023-07-09 LAB — COMPLETE METABOLIC PANEL WITH GFR
AG Ratio: 1.4 (calc) (ref 1.0–2.5)
ALT: 13 U/L (ref 6–29)
AST: 24 U/L (ref 10–35)
Albumin: 4.1 g/dL (ref 3.6–5.1)
Alkaline phosphatase (APISO): 77 U/L (ref 37–153)
BUN: 17 mg/dL (ref 7–25)
CO2: 30 mmol/L (ref 20–32)
Calcium: 9.9 mg/dL (ref 8.6–10.4)
Chloride: 107 mmol/L (ref 98–110)
Creat: 0.98 mg/dL (ref 0.50–1.05)
Globulin: 3 g/dL (ref 1.9–3.7)
Glucose, Bld: 121 mg/dL (ref 65–139)
Potassium: 5 mmol/L (ref 3.5–5.3)
Sodium: 144 mmol/L (ref 135–146)
Total Bilirubin: 0.5 mg/dL (ref 0.2–1.2)
Total Protein: 7.1 g/dL (ref 6.1–8.1)
eGFR: 64 mL/min/{1.73_m2} (ref >=60)

## 2023-07-09 LAB — QUANTIFERON-TB GOLD PLUS
Mitogen-NIL: >10 [IU]/mL
NIL: 0.02 [IU]/mL
QuantiFERON-TB Gold Plus: NEGATIVE
TB1-NIL: 0.01 [IU]/mL
TB2-NIL: 0.01 [IU]/mL

## 2023-07-10 NOTE — Progress Notes (Signed)
TB gold negative

## 2023-07-12 ENCOUNTER — Other Ambulatory Visit: Payer: Self-pay | Admitting: *Deleted

## 2023-07-12 DIAGNOSIS — Z79899 Other long term (current) drug therapy: Secondary | ICD-10-CM

## 2023-07-12 MED ORDER — ENBREL SURECLICK 50 MG/ML ~~LOC~~ SOAJ: SUBCUTANEOUS | 0 refills | Status: DC

## 2023-07-12 NOTE — Telephone Encounter (Signed)
Patient requested refill on Enbrel to be sent to [facility].  Last Fill: 04/11/2023  Labs: 07/06/2023 Absolute eosinophils are elevated Rest of CBC WNL. We will continue to monitor. CMP WNL.     TB Gold: 07/06/2023 Neg    Next Visit: 07/28/2023  Last Visit: 05/25/2023  [id number] syndrome with keratoconjunctivitis sicca   Current Dose per office note 05/25/2023: Enbrel every 10 days but has increased the frequency to once weekly for the past 6 weeks due to the increase in symptoms.   Okay to refill Enbrel?

## 2023-07-13 DIAGNOSIS — Z794 Long term (current) use of insulin: Secondary | ICD-10-CM | POA: Diagnosis not present

## 2023-07-13 DIAGNOSIS — E1040 Type 1 diabetes mellitus with diabetic neuropathy, unspecified: Secondary | ICD-10-CM | POA: Diagnosis not present

## 2023-07-14 NOTE — Progress Notes (Signed)
 Office Visit Note  [name]             Date of Birth: 1956-05-11           MRN: [id number]             PCP: Nichole Senior, MD Referring: Nichole Senior, MD Visit Date: 07/28/2023 Occupation: @GUAROCC @  Subjective:  Medication monitoring   History of Present Illness: Becky [name] is a 68 y.o. female with history of sjogren's syndrome and rheumatoid arthritis. She remains on Enbrel  50 mg sq injections once every 7 days.  She is taking plaquenil  200 mg 1 tablet by mouth daily.   Plaquenil  was restarted after her last office visit on 05/25/23.  She continues to have chronic pain and intermittent inflammation in both hands.  Patient has not yet noticed any improvement in her hand pain and inflammation since initiating Plaquenil .  Patient reports that she received notification from the Enbrel  patient assistance program that she would no longer qualified.  Patient states that her insurance is also declined viscosupplementation for both knees.  She previously had good response to viscosupplementation but is unsure what her options will be going forward. She states her and her husband have retired and will have a change of income going forward.    Activities of Daily Living:  Patient reports joint stiffness all day  Patient Reports nocturnal pain.  Difficulty dressing/grooming: Denies Difficulty climbing stairs: Denies Difficulty getting out of chair: Denies Difficulty using hands for taps, buttons, cutlery, and/or writing: Reports  Review of Systems  Constitutional:  Positive for fatigue.  HENT:  Positive for mouth dryness. Negative for mouth sores.   Eyes:  Positive for dryness.  Respiratory:  Negative for shortness of breath.   Cardiovascular:  Negative for chest pain and palpitations.  Gastrointestinal:  Negative for blood in stool, constipation and diarrhea.  Endocrine: Negative for increased urination.  Genitourinary:  Negative for involuntary urination.   Musculoskeletal:  Positive for joint pain, joint pain, joint swelling and morning stiffness. Negative for gait problem, myalgias, muscle weakness, muscle tenderness and myalgias.  Skin:  Positive for hair loss. Negative for color change, rash and sensitivity to sunlight.  Allergic/Immunologic: Negative for susceptible to infections.  Neurological:  Negative for dizziness and headaches.  Hematological:  Negative for swollen glands.  Psychiatric/Behavioral:  Positive for depressed mood and sleep disturbance. The patient is nervous/anxious.     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 determined by examination associated with type 1 diabetes mellitus (HCC) 03/18/2020   Stable treated proliferative diabetic retinopathy of right eye determined by examination associated with type 2 diabetes mellitus (HCC) 03/18/2020   History of vitrectomy 03/18/2020   Complicated UTI (urinary tract infection) 12/13/2018   Failure of outpatient treatment 12/11/2018   Acute metabolic encephalopathy 12/11/2018   Fever 12/10/2018   At high risk for breast cancer 05/09/2018   Eosinophilia 01/31/2017   Iritis 01/04/2017   History of joint swelling 08/24/2016   Osteoarthritis of knees, bilateral 08/24/2016   History of migraine 08/24/2016   History of depression 08/24/2016   History  of osteoporosis 08/24/2016   DDD (degenerative disc disease), lumbar 08/24/2016   High risk medication use 08/24/2016   Trigger finger, left middle finger 08/24/2016   Trigger finger, right ring finger 08/24/2016    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   Increased risk of breast cancer 07/06/2015   IDDM (insulin  dependent diabetes mellitus) 07/06/2015   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 Cateracts bil eyes removed   Eczema    GERD (gastroesophageal reflux disease)    Heart murmur    History of COVID-19 06/14/2020   Hypercholesterolemia    IDDM (insulin  dependent diabetes mellitus) 1969   Migraine headache    Neuromuscular disorder (HCC)    bil legs & feet - neuropathy   Neuropathy    Osteoporosis    10/2012   Otosclerosis    Pigmented purpura (HCC)    RA (rheumatoid arthritis) (HCC) 2000   S/P cardiac cath 01/2012   Normal coronaries and normal LV function.   Sjoegren syndrome     Family History  Problem Relation Age of Onset   Breast cancer Mother 92   Colon polyps Mother        unspecified number   Uterine cancer Mother        metastatic   Endometrial cancer Mother 38   Stroke Father 50   Dementia Father    Rheum arthritis Sister    Breast cancer Sister 48       lump; (-)GT (breast/ova ca panel, GeneDx)   Breast cancer Maternal Grandmother 64   Skin cancer Maternal Grandfather        unspecified type   Prostate cancer Maternal Grandfather        dx. late 2s   Breast cancer Cousin        bilateral; dx. late 59s   Breast cancer Other        dx. in their late 60s   Breast cancer  Other        dx. 78-79   Colon cancer Neg Hx    Esophageal cancer Neg Hx    Rectal cancer Neg Hx    Stomach cancer Neg Hx    Past Surgical History:  Procedure Laterality Date   back ablation  03/2020   back ablation  10/09/2020   BREAST LUMPECTOMY  1995   CARPAL TUNNEL RELEASE  2000   CATARACT EXTRACTION Right 09-05-12   CATARACT EXTRACTION Left 3/15   CESAREAN SECTION  1987   COLONOSCOPY     EYE SURGERY     diabetic retinopathy    HYSTEROSCOPY  02/2001   and D & C   LYMPH NODE DISSECTION  2002   TOE SURGERY  2000   bone removed   Social History   Social History Narrative   Not on file   Immunization History  Administered Date(s) Administered   PFIZER(Purple Top)SARS-COV-2 Vaccination 07/11/2019,  08/01/2019, 02/05/2020, 09/15/2020   Tdap 06/21/2013     Objective: Vital Signs: BP 132/67 (BP Location: Left Arm, Patient Position: Sitting, Cuff Size: Normal)   Pulse 86   Resp 16   Ht 5' 4 (1.626 m)   Wt 127 lb (57.6 kg)   LMP 09/19/2008   BMI 21.80 kg/m    Physical Exam Vitals and nursing note reviewed.  Constitutional:      Appearance: She is well-developed.  HENT:     Head: Normocephalic and atraumatic.  Eyes:     Conjunctiva/sclera: Conjunctivae normal.  Cardiovascular:     Rate and Rhythm: Normal rate and regular rhythm.     Heart sounds: Normal heart sounds.  Pulmonary:     Effort: Pulmonary effort is normal.     Breath sounds: Normal breath sounds.  Abdominal:     General: Bowel sounds are normal.     Palpations: Abdomen is soft.  Musculoskeletal:     Cervical back: Normal range of motion.  Lymphadenopathy:     Cervical: No cervical adenopathy.  Skin:    General: Skin is warm and dry.     Capillary Refill: Capillary refill takes less than 2 seconds.  Neurological:     Mental Status: She is alert and oriented to person, place, and time.  Psychiatric:        Behavior: Behavior normal.      Musculoskeletal Exam: C-spine has limited range of  motion with lateral rotation.  Shoulder joints have good range of motion.  Elbow joints are good range of motion with no tenderness or inflammation.  Tenderness and synovitis of the right second and third MCP joints.  Tenderness and synovitis of the left fourth PIP joint.  PIP and DIP thickening.  Hip joints have good range of motion no groin pain.  Discomfort with range of motion of both knee joints but no effusion noted.  Ankle joints have good range of motion with no joint tenderness or synovitis.  CDAI Exam: CDAI Score: -- Patient Global: --; Provider Global: -- Swollen: 3 ; Tender: 3  Joint Exam 07/28/2023      Right  Left  MCP 2  Swollen Tender     MCP 3  Swollen Tender     PIP 4 (finger)     Swollen Tender     Investigation: No additional findings.  Imaging: No results found.  Recent Labs: Lab Results  Component Value Date   WBC 5.5 07/06/2023   HGB 14.2 07/06/2023   PLT 274 07/06/2023   NA 144 07/06/2023   K 5.0 07/06/2023   CL 107 07/06/2023   CO2 30 07/06/2023   GLUCOSE 121 07/06/2023   BUN 17 07/06/2023   CREATININE 0.98 07/06/2023   BILITOT 0.5 07/06/2023   ALKPHOS 79 05/13/2020   AST 24 07/06/2023   ALT 13 07/06/2023   PROT 7.1 07/06/2023   ALBUMIN 3.9 05/13/2020   CALCIUM 9.9 07/06/2023   GFRAA 66 12/17/2020   QFTBGOLD NEGATIVE 04/06/2017   QFTBGOLDPLUS NEGATIVE 07/06/2023    Speciality Comments: Recieves Enbrel  [employer] through [employer]. Prolia  start 01/12/21  Procedures:  No procedures performed Allergies: Chlorhexidine, Denosumab , Doxycycline, Macrobid [nitrofurantoin macrocrystal], Molnupiravir, Ciprofloxacin, and Penicillins   Assessment / Plan:     Visit Diagnoses: 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 without organ or systems involvement [facility]): Patient presents today with pain and inflammation involving multiple joints in both hands.  She has tenderness and synovitis of the right first, second,  and  third MCP joints and the left fourth PIP joint.  She is currently prescribed Enbrel  50 mg sq injections every week and is taking Plaquenil  200 mg 1 tablet by mouth daily.  Plaquenil  was added after her last office visit but she has not yet noticed any clinical benefit.  She is tolerating combination therapy but has been holding Enbrel  for the past 1 week while taking an antibiotic for a staph infection.  According to the patient she received notification that she will no longer qualify for patient assistance for Enbrel  going forward.  Considering she is having an inadequate response to Enbrel  and will no longer be able to afford Enbrel  different treatment options were discussed today in detail.  Indications, contraindications, potential side effects of Humira  were reviewed.  All questions were addressed and consent was obtained today.  Plan to apply for Humira  through her insurance and once approved she will return to the office for administration of the first injection.  She will remain on Plaquenil  as combination therapy for now.  She will follow-up in the office in 6 to 8 weeks to assess her response.  Counseled patient that Humira  is a TNF blocking agent.  Counseled patient on purpose, proper use, and adverse effects of Humira .  Reviewed the most common adverse effects including infections, headache, and injection site reactions. Discussed that there is the possibility of an increased risk of malignancy including non-melanoma skin cancer but it is not well understood if this increased risk is due to the medication or the disease state.  Advised patient to get yearly dermatology exams due to risk of skin cancer. Counseled patient that Humira  should be held prior to scheduled surgery.  Counseled patient to avoid live vaccines while on Humira .  Recommend annual influenza, PCV 15 or PCV20 or Pneumovax 23, and Shingrix as indicated.  Reviewed the importance of regular labs while on Humira  therapy.  Will monitor  CBC and CMP 1 month after starting and then every 3 months routinely thereafter. Will monitor TB gold annually. Standing orders placed.    Provided patient with medication education material and answered all questions.  Patient consented to Humira .  Will upload consent into the media tab.  Reviewed storage instructions of Humira .  Advised initial injection must be administered in office.  Patient verbalized understanding.   Dose will be for rheumatoid arthritis Humira  40 mg every 14 days.  Prescription pending lab results and/or insurance approval.  Sjogren's syndrome with keratoconjunctivitis sicca (HCC): She continues to have chronic sicca symptoms which have been unchanged.  High risk medication use - Plan to apply for Humira  40 mg sq injections every 14 days.  She will remain on Plaquenil  200 mg 1 tablet by mouth daily.  Inadequate response and loss of patient assistance for Enbrel .  CBC and CMP updated on 07/06/23. Her next lab work will be due in April and every 3 months.  TB gold negative on 07/06/23.  Previous therapy: Orencia , Methotrexate , sulfasalazine, and Arava.   Inadequate response to plaquenil  as monotherapy.  No plaquenil  eye examination on file.  Patient was recently treated for a staph infection with a course of antibiotics.  She has been holding Enbrel  until the infection has resolved and she has completed the course of antibiotics. Discussed the importance of holding Humira  if she develops signs or symptoms of an infection going forward and to resume once the infection has cleared.  Iritis - No active inflammation noted on exam from 05/04/2023  Trigger thumb,  left thumb: She continues to experience intermittent discomfort in the left thumb.  Primary osteoarthritis of both knees: No warmth or effusion was noted on examination today.Patient experiences intermittent pain and stiffness involving both knees due to underlying osteoarthritis.  According to the patient she has been  declined for viscosupplementation for both knees.  She previously had significant relief after undergoing viscosupplementation but does not qualify for Visco at this time.  DDD (degenerative disc disease), cervical - Referred to PT at last OV-completed on 03/16/23.  She has limited range of motion with lateral rotation.    Degeneration of intervertebral disc of lumbar region without discogenic back pain or lower extremity pain: She continues to experience intermittent discomfort in her lower back.  Age-related osteoporosis without current pathological fracture - DEXA ordered by Dr. Nichole.  Prolia  injections x3.  Her most recent injection was administered on 07/27/21.  Discontinued Prolia  due to side effects  Other medical conditions are listed as follows:   History of migraine  Other fatigue  History of gastroesophageal reflux (GERD)  History of diabetes mellitus  History of depression  Orders: No orders of the defined types were placed in this encounter.  No orders of the defined types were placed in this encounter.   Follow-Up Instructions: Return in about 5 months (around 12/25/2023) for Rheumatoid arthritis, Sjogren's syndrome.   Waddell CHRISTELLA Craze, PA-C  Note - This record has been created using Dragon software.  Chart creation errors have been sought, but may not always  have been located. Such creation errors do not reflect on  the standard of medical care.

## 2023-07-20 DIAGNOSIS — Z794 Long term (current) use of insulin: Secondary | ICD-10-CM | POA: Diagnosis not present

## 2023-07-20 DIAGNOSIS — E1040 Type 1 diabetes mellitus with diabetic neuropathy, unspecified: Secondary | ICD-10-CM | POA: Diagnosis not present

## 2023-07-21 DIAGNOSIS — L089 Local infection of the skin and subcutaneous tissue, unspecified: Secondary | ICD-10-CM | POA: Diagnosis not present

## 2023-07-26 NOTE — Telephone Encounter (Signed)
We received notification from [facility] that they denied coverage for Synvisc and Orthovisc.

## 2023-07-27 DIAGNOSIS — Z794 Long term (current) use of insulin: Secondary | ICD-10-CM | POA: Diagnosis not present

## 2023-07-27 DIAGNOSIS — E1040 Type 1 diabetes mellitus with diabetic neuropathy, unspecified: Secondary | ICD-10-CM | POA: Diagnosis not present

## 2023-07-28 ENCOUNTER — Telehealth: Payer: Self-pay | Admitting: *Deleted

## 2023-07-28 ENCOUNTER — Ambulatory Visit: Payer: Medicare Other | Attending: Physician Assistant | Admitting: Physician Assistant

## 2023-07-28 ENCOUNTER — Encounter: Payer: Self-pay | Admitting: Physician Assistant

## 2023-07-28 VITALS — BP 132/67 | HR 86 | Resp 16 | Ht 64.0 in | Wt 127.0 lb

## 2023-07-28 DIAGNOSIS — M3501 Sicca syndrome with keratoconjunctivitis: Secondary | ICD-10-CM

## 2023-07-28 DIAGNOSIS — R5383 Other fatigue: Secondary | ICD-10-CM

## 2023-07-28 DIAGNOSIS — H209 Unspecified iridocyclitis: Secondary | ICD-10-CM | POA: Diagnosis not present

## 2023-07-28 DIAGNOSIS — M0579 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 without organ or systems involvement: Secondary | ICD-10-CM

## 2023-07-28 DIAGNOSIS — Z79899 Other long term (current) drug therapy: Secondary | ICD-10-CM | POA: Diagnosis not present

## 2023-07-28 DIAGNOSIS — M51369 Other intervertebral disc degeneration, lumbar region without mention of lumbar back pain or lower extremity pain: Secondary | ICD-10-CM

## 2023-07-28 DIAGNOSIS — Z8659 Personal history of other mental and behavioral disorders: Secondary | ICD-10-CM

## 2023-07-28 DIAGNOSIS — Z8719 Personal history of other diseases of the digestive system: Secondary | ICD-10-CM

## 2023-07-28 DIAGNOSIS — M810 Age-related osteoporosis without current pathological fracture: Secondary | ICD-10-CM

## 2023-07-28 DIAGNOSIS — M65312 Trigger thumb, left thumb: Secondary | ICD-10-CM

## 2023-07-28 DIAGNOSIS — M170 Bilateral primary osteoarthritis of knee: Secondary | ICD-10-CM

## 2023-07-28 DIAGNOSIS — Z8669 Personal history of other diseases of the nervous system and sense organs: Secondary | ICD-10-CM

## 2023-07-28 DIAGNOSIS — Z8639 Personal history of other endocrine, nutritional and metabolic disease: Secondary | ICD-10-CM

## 2023-07-28 DIAGNOSIS — M5030 Other cervical disc degeneration, unspecified cervical region: Secondary | ICD-10-CM

## 2023-07-28 NOTE — Telephone Encounter (Signed)
 Patient is in the office, patient stated she was denied patient assistance for Enbrel , she and her husband have recently retired, she wants to reapply for patient assistance since their house income is less. Thank you.

## 2023-07-28 NOTE — Patient Instructions (Signed)
 Adalimumab Injection What is this medication? ADALIMUMAB (ay da LIM yoo mab) treats autoimmune conditions, such as psoriasis, arthritis, Crohn disease, and ulcerative colitis. It works by slowing down an overactive immune system.  It may also be used to treat hidradenitis suppurativa (HS). HS is a condition that causes painful lumps under the skin in areas such as the armpits and groin. It belongs to a group of medications called TNF inhibitors. It is a monoclonal antibody. This medicine may be used for other purposes; ask your health care provider or pharmacist if you have questions. COMMON BRAND NAME(S): ABRILADA, AMJEVITA, CYLTEZO, HADLIMA, Hulio, Hulio PEN, Humira, HUMIRA PEN, Hyrimoz, Idacio, Simlandi, Yuflyma, YUSIMRY What should I tell my care team before I take this medication? They need to know if you have any of these conditions: Cancer Diabetes (high blood sugar) Having surgery Heart disease Hepatitis B Immune system problems Infections, such as tuberculosis (TB) or other bacterial, fungal, or viral infections Multiple sclerosis Recent or upcoming vaccine An unusual or allergic reaction to adalimumab, mannitol, latex, rubber, other medications, foods, dyes, or preservatives Pregnant or trying to get pregnant Breast-feeding How should I use this medication? This medication is injected under the skin. It may be given by your care team in a hospital or clinic setting. It may also be given at home. If you get this medication at home, you will be taught how to prepare and give it. Use exactly as directed. Take it as directed on the prescription label. Keep taking it unless your care team tells you to stop. This medication comes with INSTRUCTIONS FOR USE. Ask your pharmacist for directions on how to use this medication. Read the information carefully. Talk to your pharmacist or care team if you have questions. It is important that you put your used needles and syringes in a special sharps  container. Do not put them in a trash can. If you do not have a sharps container, call your pharmacist or care team to get one. A special MedGuide will be given to you by the pharmacist with each prescription and refill. If you are getting this medication in a hospital or clinic, a special MedGuide will be given to you before each treatment. Be sure to read this information carefully each time. Talk to your care team about the use of this medication in children. While it be prescribed for children as young as 2 years for selected conditions, precautions do apply. Overdosage: If you think you have taken too much of this medicine contact a poison control center or emergency room at once. NOTE: This medicine is only for you. Do not share this medicine with others. What if I miss a dose? If you get this medication at the hospital or clinic: it is important not to miss your dose. Call your care team if you are unable to keep an appointment. If you give yourself this medication at home: If you miss a dose, take it as soon as you can. If it is almost time for your next dose, take only that dose. Do not take double or extra doses. Call your care team with questions. What may interact with this medication? Do not take this medication with any of the following: Abatacept Anakinra Biologic medications, such as certolizumab, etanercept, golimumab, infliximab Live virus vaccines This medication may also interact with the following: Cyclosporine Theophylline Vaccines Warfarin This list may not describe all possible interactions. Give your health care provider a list of all the medicines, herbs, non-prescription drugs,  or dietary supplements you use. Also tell them if you smoke, drink alcohol, or use illegal drugs. Some items may interact with your medicine. What should I watch for while using this medication? Visit your care team for regular checks on your progress. Tell your care team if your symptoms do not  start to get better or if they get worse. You will be tested for tuberculosis (TB) before you start this medication. If your care team prescribes any medication for TB, you should start taking the TB medication before starting this medication. Make sure to finish the full course of TB medication. This medication may increase your risk of getting an infection. Call your care team for advice if you get a fever, chills, sore throat, or other symptoms of a cold or flu. Do not treat yourself. Try to avoid being around people who are sick. Talk to your care team about your risk of cancer. You may be more at risk for certain types of cancer if you take this medication. What side effects may I notice from receiving this medication? Side effects that you should report to your care team as soon as possible: Allergic reactions--skin rash, itching, hives, swelling of the face, lips, tongue, or throat Aplastic anemia--unusual weakness or fatigue, dizziness, headache, trouble breathing, increased bleeding or bruising Body pain, tingling, or numbness Heart failure--shortness of breath, swelling of the ankles, feet, or hands, sudden weight gain, unusual weakness or fatigue Infection--fever, chills, cough, sore throat, wounds that don't heal, pain or trouble when passing urine, general feeling of discomfort or being unwell Lupus-like syndrome--joint pain, swelling, or stiffness, butterfly-shaped rash on the face, rashes that get worse in the sun, fever, unusual weakness or fatigue Unusual bruising or bleeding Side effects that usually do not require medical attention (report to your care team if they continue or are bothersome): Headache Nausea Pain, redness, or irritation at injection site Runny or stuffy nose Sore throat Stomach pain This list may not describe all possible side effects. Call your doctor for medical advice about side effects. You may report side effects to FDA at 1-800-FDA-1088. Where should I  keep my medication? Keep out of the reach of children and pets. Store in the refrigerator. Do not freeze. Keep this medication in the original packaging until you are ready to take it. Protect from light. Get rid of any unused medication after the expiration date. This medication may be stored at room temperature for up to 14 days. Keep this medication in the original packaging. Protect from light. If it is stored at room temperature, get rid of any unused medication after 14 days or after it expires, whichever is first. To get rid of medications that are no longer needed or have expired: Take the medication to a medication take-back program. Check with your pharmacy or law enforcement to find a location. If you cannot return the medication, ask your pharmacist or care team how to get rid of this medication safely. NOTE: This sheet is a summary. It may not cover all possible information. If you have questions about this medicine, talk to your doctor, pharmacist, or health care provider.  2024 Elsevier/Gold Standard (2023-05-20 00:00:00)

## 2023-07-29 ENCOUNTER — Telehealth: Payer: Self-pay | Admitting: Pharmacist

## 2023-07-29 NOTE — Telephone Encounter (Addendum)
 Submitted a Prior Authorization request to [facility] for HUMIRA  via CoverMyMeds. Will update once we receive a response.  Key: [id number]   ----- Message from CMA Marissa G sent at 07/28/2023  2:14 PM EST ----- Please apply for humira , per Waddell Craze, PA-C. Thanks!   -Consent obtained and sent to the scan center.  -Sign patient assistance application is on Armand Preast's desk.

## 2023-07-29 NOTE — Telephone Encounter (Signed)
 Applying for Humira instead per OV note - closing this encounter  Geraldene Kleine, PharmD, MPH, BCPS, CPP Clinical Pharmacist (Rheumatology and Pulmonology)

## 2023-08-02 ENCOUNTER — Other Ambulatory Visit (HOSPITAL_COMMUNITY): Payer: Self-pay

## 2023-08-02 NOTE — Telephone Encounter (Signed)
Received notification from [facility] regarding a prior authorization for HUMIRA. Authorization has been APPROVED from 07/29/2023 to 07/28/2024. Approval letter sent to scan center.  Per test claim, copay for 28 days supply is $1932.48  Submitted Patient Assistance Application to [employer] for HUMIRA along with provider portion, patient portion, PA, medication list, insurance card copy. Will update patient when we receive a response.  Phone: [phone] Fax: [phone]  Chesley Mires, PharmD, MPH, BCPS, CPP Clinical Pharmacist (Rheumatology and Pulmonology)

## 2023-08-03 DIAGNOSIS — Z794 Long term (current) use of insulin: Secondary | ICD-10-CM | POA: Diagnosis not present

## 2023-08-03 DIAGNOSIS — E1040 Type 1 diabetes mellitus with diabetic neuropathy, unspecified: Secondary | ICD-10-CM | POA: Diagnosis not present

## 2023-08-18 NOTE — Telephone Encounter (Signed)
 Received a fax from  [employer] regarding an approval for HUMIRA patient assistance from 08/18/2023 to 06/20/2024. Approval letter sent to scan center.  Phone: [phone] Fax: [phone]  Patient can be scheduled for Humira new start  Chesley Mires, PharmD, MPH, BCPS, CPP Clinical Pharmacist (Rheumatology and Pulmonology)

## 2023-08-19 NOTE — Telephone Encounter (Signed)
 Patient scheduled for Humira new start on 08/22/2023. Denies active infection or upcoming surgeries. Her last Enbrel dose was almost a month ago  Chesley Mires, PharmD, MPH, BCPS, CPP Clinical Pharmacist (Rheumatology and Pulmonology)

## 2023-08-22 ENCOUNTER — Ambulatory Visit: Payer: Medicare Other | Attending: Rheumatology | Admitting: Pharmacist

## 2023-08-22 DIAGNOSIS — G43719 Chronic migraine without aura, intractable, without status migrainosus: Secondary | ICD-10-CM | POA: Diagnosis not present

## 2023-08-22 DIAGNOSIS — Z79899 Other long term (current) drug therapy: Secondary | ICD-10-CM

## 2023-08-22 DIAGNOSIS — Z7189 Other specified counseling: Secondary | ICD-10-CM

## 2023-08-22 DIAGNOSIS — M542 Cervicalgia: Secondary | ICD-10-CM | POA: Diagnosis not present

## 2023-08-22 DIAGNOSIS — H209 Unspecified iridocyclitis: Secondary | ICD-10-CM

## 2023-08-22 DIAGNOSIS — M791 Myalgia, unspecified site: Secondary | ICD-10-CM | POA: Diagnosis not present

## 2023-08-22 DIAGNOSIS — M0579 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 without organ or systems involvement: Secondary | ICD-10-CM

## 2023-08-22 DIAGNOSIS — G518 Other disorders of facial nerve: Secondary | ICD-10-CM | POA: Diagnosis not present

## 2023-08-22 MED ORDER — HUMIRA (2 PEN) 40 MG/0.4ML ~~LOC~~ AJKT: 40.0000 mg | AUTO-INJECTOR | SUBCUTANEOUS | 0 refills | Status: DC

## 2023-08-22 NOTE — Progress Notes (Signed)
 Pharmacy Note  Subjective:   Patient presents to clinic today to receive first dose of Humira for rheumatoid arthritis. Patient states that her last dose of Enbrel was almost a month ago.  She currently takes hydroxychloroquine 200mg  daily  Patient running a fever or have signs/symptoms of infection? No  Patient currently on antibiotics for the treatment of infection? No  Patient have any upcoming invasive procedures/surgeries? No  Objective: CMP     Component Value Date/Time   NA 144 07/06/2023 1405   NA 142 01/05/2017 1257   K 5.0 07/06/2023 1405   K 4.3 01/05/2017 1257   CL 107 07/06/2023 1405   CO2 30 07/06/2023 1405   CO2 24 01/05/2017 1257   GLUCOSE 121 07/06/2023 1405   GLUCOSE 95 01/05/2017 1257   BUN 17 07/06/2023 1405   BUN 11.4 01/05/2017 1257   CREATININE 0.98 07/06/2023 1405   CREATININE 1.0 01/05/2017 1257   CALCIUM 9.9 07/06/2023 1405   CALCIUM 9.7 01/05/2017 1257   PROT 7.1 07/06/2023 1405   PROT 7.1 01/05/2017 1257   ALBUMIN 3.9 05/13/2020 1221   ALBUMIN 3.9 01/05/2017 1257   AST 24 07/06/2023 1405   AST 29 05/13/2020 1221   AST 24 01/05/2017 1257   ALT 13 07/06/2023 1405   ALT 27 05/13/2020 1221   ALT 18 01/05/2017 1257   ALKPHOS 79 05/13/2020 1221   ALKPHOS 67 01/05/2017 1257   BILITOT 0.5 07/06/2023 1405   BILITOT 0.5 05/13/2020 1221   BILITOT 0.26 01/05/2017 1257   GFRNONAA 57 (L) 12/17/2020 0000   GFRAA 66 12/17/2020 0000    CBC    Component Value Date/Time   WBC 5.5 07/06/2023 1405   RBC 4.84 07/06/2023 1405   HGB 14.2 07/06/2023 1405   HGB 13.9 05/13/2020 1221   HGB 13.6 01/05/2017 1257   HCT 44.3 07/06/2023 1405   HCT 41.9 01/05/2017 1257   PLT 274 07/06/2023 1405   PLT 236 05/13/2020 1221   PLT 262 01/05/2017 1257   MCV 91.5 07/06/2023 1405   MCV 94.0 01/05/2017 1257   MCH 29.3 07/06/2023 1405   MCHC 32.1 07/06/2023 1405   RDW 12.9 07/06/2023 1405   RDW 16.7 (H) 01/05/2017 1257   LYMPHSABS 1,528 02/23/2023 1310    LYMPHSABS 1.6 01/05/2017 1257   MONOABS 0.4 05/13/2020 1221   MONOABS 0.5 01/05/2017 1257   EOSABS 765 (H) 07/06/2023 1405   EOSABS 0.5 01/05/2017 1257   BASOSABS 99 07/06/2023 1405   BASOSABS 0.1 01/05/2017 1257    Baseline Immunosuppressant Therapy Labs TB GOLD    Latest Ref Rng & Units 07/06/2023    2:05 PM  Quantiferon TB Gold  Quantiferon TB Gold Plus NEGATIVE NEGATIVE    Hepatitis panel: negative (11/10/2005)   HIV Lab Results  Component Value Date   HIV Non Reactive 12/10/2018   Immunoglobulins   SPEP    Latest Ref Rng & Units 07/06/2023    2:05 PM  Serum Protein Electrophoresis  Total Protein 6.1 - 8.1 g/dL 7.1    Chest x-ray: 1/61/0960 - No active disease  Assessment/Plan:  Reviewed importance of holding Humira with signs/symptoms of an infections, if antibiotics are prescribed to treat an active infection, and with invasive procedures  Demonstrated proper injection technique with Humira demo device  Patient able to demonstrate proper injection technique using the teach back method.  Patient self injected in the right lower abdomen with:  Sample Medication: Humira 40mg /0.59mL pen injector NDC: [address]-14 Lot: [id number] Expiration: 03/2024  Patient tolerated well.  Observed for 30 mins in office for adverse reaction. Patient denies itchiness and irritation at injection., No swelling or redness noted., and Reviewed injection site reaction management with patient verbally and printed information for review in AVS  Patient is to return in 1 month for labs and 6-8 weeks for follow-up appointment.  Standing orders for CBC/CMP placed.  TB gold will be monitored yearly.  Patient sees dermatology regularly  Humira approved through patient assistance .   Rx sent to: [facility] for Humira/Rinvoq/Skyri[facility]: [phone].  Patient provided with pharmacy phone number and advised to call later this week to schedule shipment to home.   Patient will continue Humira 40mg   subcut every 14 days in combination with hydroxychloroquine 200mg  daily.  All questions encouraged and answered.  Instructed patient to call with any further questions or concerns.  Chesley Mires, PharmD, MPH, BCPS, CPP Clinical Pharmacist (Rheumatology and Pulmonology)  08/22/2023 9:01 AM

## 2023-08-22 NOTE — Patient Instructions (Signed)
 Your next HUMIRA dose is due on 09/05/2023, 09/19/2023, and every 14 days thereafter  CONTINUE hydroxychloroquine 200mg  daily  HOLD HUMIRA if you have signs or symptoms of an infection. You can resume once you feel better or back to your baseline. HOLD HUMIRA if you start antibiotics to treat an infection. HOLD HUMIRA around the time of surgery/procedures. Your surgeon will be able to provide recommendations on when to hold BEFORE and when you are cleared to RESUME.  Pharmacy information: Your prescription will be shipped from [facility]. Their phone number is [phone] Please call to schedule shipment and confirm address. They will mail your medication to your home.  Labs are due in 1 month then every 3 months. Lab hours are from Monday to Thursday 8am-12:30pm and 1pm-5pm and Friday 8am-12pm. You do not need an appointment if you come for labs during these times. If you'd like to go to a [facility] or [facility] closer to home, please call our clinic 48 hours prior to lab date so we can release orders in a timely manner.  Stay up to date on all routine vaccines: influenza, pneumonia, COVID19, Shingles  How to manage an injection site reaction: Remember the 5 C's: COUNTER - leave on the counter at least 30 minutes but up to overnight to bring medication to room temperature. This may help prevent stinging COLD - place something cold (like an ice gel pack or cold water bottle) on the injection site just before cleansing with alcohol. This may help reduce pain CLARITIN - use Claritin (generic name is loratadine) for the first two weeks of treatment or the day of, the day before, and the day after injecting. This will help to minimize injection site reactions CORTISONE CREAM - apply if injection site is irritated and itching CALL ME - if injection site reaction is bigger than the size of your fist, looks infected, blisters, or if you develop hives

## 2023-08-30 NOTE — Telephone Encounter (Signed)
 Patient called [employer] and submitted an appeal for the orthovisc injections.  Patient was given a fax number and we faxed the PA & notes to #[phone]

## 2023-09-05 NOTE — Telephone Encounter (Signed)
 Please call to schedule visco injections.  Approved for Orthovisc, Bilateral knee(s). Buy & Bill $15 co-pay Deductible does not apply No Prior Authorization

## 2023-09-05 NOTE — Telephone Encounter (Signed)
 Tonya from [employer] called stating patient has been approved for Orthovisc.  Archie Patten states the PA was entered incorrectly at [facility] and was submitted through Part D instead of her medical insurance.  No PA is required by [facility] for Orthovisc which is a preferred product.

## 2023-09-06 DIAGNOSIS — E1040 Type 1 diabetes mellitus with diabetic neuropathy, unspecified: Secondary | ICD-10-CM | POA: Diagnosis not present

## 2023-09-06 DIAGNOSIS — I129 Hypertensive chronic kidney disease with stage 1 through stage 4 chronic kidney disease, or unspecified chronic kidney disease: Secondary | ICD-10-CM | POA: Diagnosis not present

## 2023-09-09 ENCOUNTER — Telehealth: Payer: Self-pay | Admitting: Internal Medicine

## 2023-09-09 ENCOUNTER — Encounter: Payer: Self-pay | Admitting: Physician Assistant

## 2023-09-09 NOTE — Telephone Encounter (Signed)
 Inbound call from patient stating she is having issues with constipation and wanted to make an appointment. Patient was scheduled for 5/13 at 1:50 with Celso Amy and is requesting a call back to discuss. Please advise.

## 2023-09-09 NOTE — Telephone Encounter (Signed)
 Left message for pt to call back

## 2023-09-12 ENCOUNTER — Other Ambulatory Visit: Payer: Self-pay | Admitting: Physician Assistant

## 2023-09-12 NOTE — Telephone Encounter (Signed)
 Last Fill: 06/02/2023  Eye exam: not on file   Labs: 07/06/2023 Absolute eosinophils are elevated Rest of CBC WNL. CMP WNL.     Next Visit: 10/18/2023  Last Visit: 07/28/2023  [id number] arthritis with rheumatoid factor of multiple sites without organ or systems involvement   Current Dose per office note 07/28/2023: Plaquenil 200 mg 1 tablet by mouth daily.   Attempted to contact the patient and left message to advise patient we need her PLQ eye exam.   Okay to refill Plaquenil?

## 2023-09-13 NOTE — Telephone Encounter (Signed)
Pt did not return phone call. Will await further communication from pt.

## 2023-10-04 ENCOUNTER — Ambulatory Visit: Attending: Physician Assistant | Admitting: Physician Assistant

## 2023-10-04 DIAGNOSIS — M170 Bilateral primary osteoarthritis of knee: Secondary | ICD-10-CM

## 2023-10-04 MED ORDER — HYALURONAN 30 MG/2ML IX SOSY
30.0000 mg | PREFILLED_SYRINGE | INTRA_ARTICULAR | Status: AC | PRN
  Administered 2023-10-04: 30 mg via INTRA_ARTICULAR

## 2023-10-04 MED ORDER — LIDOCAINE HCL 1 % IJ SOLN
1.5000 mL | INTRAMUSCULAR | Status: AC | PRN
  Administered 2023-10-04: 1.5 mL

## 2023-10-04 NOTE — Progress Notes (Unsigned)
 Office Visit Note  [name]             Date of Birth: 01-16-1956           MRN: [id number]             PCP: Becky Coons, MD Referring: Becky Coons, MD Visit Date: 10/18/2023 Occupation: @GUAROCC @  Subjective:  3rd orthovisc injection for both knees   History of Present Illness: Becky [name] is a 68 y.o. female with history of seropositive rheumatoid arthritis and sjogren's syndrome.  Patient remains on Humira 40 mg sq injections every 14 days. Humira was started on 08/22/23.  She is tolerating the marrow without any side effects or injection site reactions.  She has noticed over 50% improvement in her symptoms and switching from Enbrel  to Humira.  She has continued to have some residual soreness and stiffness in the right 2nd and 3rd MCP joints but has noticed less inflammation involving both hands.  The pain in her thumbs has resolved.  She is no longer taking Plaquenil .  Patient states that her bilateral knee joint pain has started to improve since undergoing Orthovisc injections for both knees.  She is due for her third injection of the series today.  Patient continues to have chronic sicca symptoms which have been unchanged.  She was evaluated by ophthalmology recently and was told that she has less eye dryness than usual.  She denies any new or worsening pulmonary symptoms.   Activities of Daily Living:  Patient reports morning stiffness for 5-10 minutes.   Patient Denies nocturnal pain.  Difficulty dressing/grooming: Denies Difficulty climbing stairs: Reports Difficulty getting out of chair: Denies Difficulty using hands for taps, buttons, cutlery, and/or writing: Denies  Review of Systems  Constitutional:  Positive for fatigue.  HENT:  Positive for mouth dryness. Negative for mouth sores and nose dryness.   Eyes:  Positive for dryness. Negative for pain, redness and visual disturbance.  Respiratory:  Negative for shortness of breath and difficulty  breathing.   Cardiovascular:  Negative for chest pain and palpitations.  Gastrointestinal:  Positive for constipation. Negative for blood in stool and diarrhea.  Endocrine: Negative for increased urination.  Genitourinary:  Negative for involuntary urination.  Musculoskeletal:  Positive for joint pain, joint pain and morning stiffness. Negative for gait problem, joint swelling, myalgias, muscle weakness, muscle tenderness and myalgias.  Skin:  Negative for color change, rash, hair loss and sensitivity to sunlight.  Allergic/Immunologic: Positive for susceptible to infections.  Neurological:  Negative for dizziness and headaches.  Hematological:  Negative for swollen glands.  Psychiatric/Behavioral:  Positive for sleep disturbance. Negative for depressed mood. The patient is not nervous/anxious.     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 determined by examination associated with type 1 diabetes mellitus (HCC) 03/18/2020   Stable treated proliferative diabetic retinopathy of right eye determined by examination associated with type 2 diabetes mellitus (HCC) 03/18/2020   History of vitrectomy 03/18/2020   Complicated UTI (urinary tract infection) 12/13/2018   Failure of outpatient treatment 12/11/2018   Acute metabolic encephalopathy 12/11/2018  Fever 12/10/2018   At high risk for breast cancer 05/09/2018   Eosinophilia 01/31/2017   Iritis 01/04/2017   History of joint swelling 08/24/2016   Osteoarthritis of knees, bilateral 08/24/2016   History of migraine 08/24/2016    History of depression 08/24/2016   History of osteoporosis 08/24/2016   DDD (degenerative disc disease), lumbar 08/24/2016   High risk medication use 08/24/2016   Trigger finger, left middle finger 08/24/2016   Trigger finger, right ring finger 08/24/2016   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   Increased risk of breast cancer 07/06/2015   IDDM (insulin  dependent diabetes mellitus) 07/06/2015   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 Cateracts bil eyes removed   Eczema    GERD (gastroesophageal reflux disease)    Heart murmur    History of COVID-19 06/14/2020   Hypercholesterolemia    IDDM (insulin  dependent diabetes mellitus) 1969   Migraine headache    Neuromuscular disorder (HCC)    bil legs & feet - neuropathy   Neuropathy    Osteoporosis    10/2012   Otosclerosis    Pigmented purpura (HCC)    RA (rheumatoid arthritis) (HCC) 2000   S/P cardiac cath 01/2012   Normal coronaries and normal LV function.   Sjoegren syndrome     Family History  Problem Relation Age of Onset   Breast cancer Mother 73   Colon polyps Mother        unspecified number   Uterine cancer Mother        metastatic   Endometrial cancer Mother 101   Stroke Father 75   Dementia Father    Rheum arthritis Sister    Breast cancer Sister 48       lump; (-)GT (breast/ova ca panel, GeneDx)   Breast cancer Maternal  Grandmother 51   Skin cancer Maternal Grandfather        unspecified type   Prostate cancer Maternal Grandfather        dx. late 11s   Breast cancer Cousin        bilateral; dx. late 57s   Breast cancer Other        dx. in their late 60s   Breast cancer Other        dx. 78-79   Colon cancer Neg Hx    Esophageal cancer Neg Hx    Rectal cancer Neg Hx    Stomach cancer Neg Hx    Past Surgical History:  Procedure Laterality Date   back ablation  03/2020   back ablation  10/09/2020   BREAST LUMPECTOMY  1995   CARPAL TUNNEL RELEASE  2000   CATARACT EXTRACTION Right 09-05-12   CATARACT EXTRACTION Left 3/15   CESAREAN SECTION  1987   COLONOSCOPY     EYE SURGERY     diabetic retinopathy    HYSTEROSCOPY  02/2001   and  D & C   LYMPH NODE DISSECTION  2002   TOE SURGERY  2000   bone removed   Social History   Social History Narrative   Not on file   Immunization History  Administered Date(s) Administered   PFIZER(Purple Top)SARS-COV-2 Vaccination 07/11/2019, 08/01/2019, 02/05/2020, 09/15/2020   Tdap 06/21/2013     Objective: Vital Signs: BP 114/73 (BP Location: Right Arm, Patient Position: Sitting, Cuff Size: Normal)   Pulse 88   Resp 13   Ht 5\' 4"  (1.626 m)   Wt 126 lb 3.2 oz (57.2 kg)   LMP 09/19/2008   BMI 21.66 kg/m    Physical Exam Vitals and nursing note reviewed.  Constitutional:      Appearance: She is well-developed.  HENT:     Head: Normocephalic and atraumatic.  Eyes:     Conjunctiva/sclera: Conjunctivae normal.  Cardiovascular:     Rate and Rhythm: Normal rate and regular rhythm.     Heart sounds: Normal heart sounds.  Pulmonary:     Effort: Pulmonary effort is normal.     Breath sounds: Normal breath sounds.  Abdominal:     General: Bowel sounds are normal.     Palpations: Abdomen is soft.  Musculoskeletal:     Cervical back: Normal range of motion.  Lymphadenopathy:     Cervical: No cervical adenopathy.  Skin:    General: Skin is warm and  dry.     Capillary Refill: Capillary refill takes less than 2 seconds.  Neurological:     Mental Status: She is alert and oriented to person, place, and time.  Psychiatric:        Behavior: Behavior normal.      Musculoskeletal Exam: C-spine, thoracic spine, lumbar spine and good range of motion.  Shoulder joints, elbow joints, and wrist joints have good range of motion with no synovitis.  Tenderness of the right 2nd and 3rd MCP joints.  Mild inflammation noted in the left fourth PIP joint.  Hip joints have good range of motion with no groin pain.  Both knee joints have good range of motion with no warmth or effusion.  Ankle joints have good range of motion with no tenderness or joint swelling.  CDAI Exam: CDAI Score: -- Patient Global: --; Provider Global: -- Swollen: --; Tender: -- Joint Exam 10/18/2023   No joint exam has been documented for this visit   There is currently no information documented on the homunculus. Go to the Rheumatology activity and complete the homunculus joint exam.  Investigation: No additional findings.  Imaging: No results found.  Recent Labs: Lab Results  Component Value Date   WBC 5.8 10/11/2023   HGB 13.5 10/11/2023   PLT 263 10/11/2023   NA 141 10/11/2023   K 4.3 10/11/2023   CL 107 10/11/2023   CO2 28 10/11/2023   GLUCOSE 127 (H) 10/11/2023   BUN 16 10/11/2023   CREATININE 0.95 10/11/2023   BILITOT 0.5 10/11/2023   ALKPHOS 79 05/13/2020   AST 16 10/11/2023   ALT 9 10/11/2023   PROT 6.5 10/11/2023   ALBUMIN 3.9 05/13/2020   CALCIUM 9.5 10/11/2023   GFRAA 66 12/17/2020   QFTBGOLD NEGATIVE 04/06/2017   QFTBGOLDPLUS NEGATIVE 07/06/2023    Speciality Comments: Recieves Enbrel  Sureclick through [employer]. Prolia  start 01/12/21  Patient states she has an appointment scheduled with her eye doctor April 2025.   Procedures:  Large Joint Inj: bilateral knee on 10/18/2023 2:50 PM Indications: pain Details: 25 G 1.5  in  needle, medial approach  Arthrogram: No  Medications (Right): 1.5 mL lidocaine  1 %; 30 mg Hyaluronan 30 MG/2ML Aspirate (Right): 0 mL Medications (Left): 1.5 mL lidocaine  1 %; 30 mg Hyaluronan 30 MG/2ML Aspirate (Left): 0 mL Outcome: tolerated well, no immediate complications Procedure, treatment alternatives, risks and benefits explained, specific risks discussed. Consent was given by the patient.     Allergies: Chlorhexidine, Denosumab , Doxycycline, Macrobid [nitrofurantoin macrocrystal], Molnupiravir, Ciprofloxacin, and Penicillins   Assessment / Plan:     Visit Diagnoses: 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 without organ or systems involvement (HCC) - She has noticed about a 50% improvement in her joint pain and inflammation since initiating Humira on 08/22/2023.  She is tolerating Humira without any side effects or injection site reactions.  She continues to have some residual tenderness of the right 2nd and 3rd MCP joints and mild inflammation in the left fourth PIP joint.  No nocturnal pain or difficulty performing ADLs. She is no longer taking plaquenil  and would like to hold off on restarting plaquenil  at this time.   She will remain on Humira 40 mg sq injections every 14 days as monotherapy and we will reassess for the full efficacy at her next visit.  She was advised to notify us  if she develops any new or worsening symptoms.  She will follow-up in the office in 3 to 4 months or sooner if needed. Plan: Sedimentation rate  High risk medication use - Humira 40 mg sq injections every 14 days. Inadequate response and loss of patient assistance for Enbrel . Humira initiated on 08/22/2023 CBC and CMP updated on 10/11/23.  Her next lab work will be due in July and every 3 months.  Standing orders for CBC and CMP remain in place. TB gold negative on 07/06/23.  Previous therapy: Enbrel , Orencia , Methotrexate , sulfasalazine, and Arava.   Inadequate response to plaquenil   as monotherapy in the past. No plaquenil  eye examination on file--patient does not plan on resuming Plaquenil  at this time. No recent or recurrent infections.  Discussed the importance of holding Humira if she develops signs or symptoms of an infection and to resume once the infection has completely cleared.  Iritis - No active inflammation noted on exam from 05/04/2023.  She will remain on Humira as prescribed.  Sjogren's syndrome with keratoconjunctivitis sicca (HCC) - She continues to have chronic sicca symptoms. Future orders for the following lab work placed today.  Patient continues to follow-up closely with ophthalmology and her dentist.  No new or worsening pulmonary symptoms. SPEP did not reveal any abnormal protein bands on 05/25/2023. Plan: Sedimentation rate, CBC with Differential/Platelet, Comprehensive metabolic panel with GFR, Sedimentation rate, C3 and C4, ANA, Serum protein electrophoresis with reflex  Trigger thumb, left thumb: Resolved.  No tenderness or locking noted on examination today.  Primary osteoarthritis of both knees: She has good range of motion of both knee joints on examination today.  No warmth or effusion noted.  She has started to notice an improvement in her pain and mobility since undergoing Orthovisc injections.  Patient presents today for the third Orthovisc injection of the series.  She tolerated procedures well.  Procedure notes were completed above.  Aftercare was discussed.  DDD (degenerative disc disease), cervical - Referred to PT at last OV-completed on 03/16/23.  Degeneration of intervertebral disc of lumbar region without discogenic back pain or lower extremity pain: Intermittent discomfort.  No symptoms of radiculopathy.  Age-related osteoporosis without current pathological fracture - DEXA  ordered by Dr. Jesse Moritz.  Prolia  injections x3.  Her most recent injection was administered on 07/27/21.  Discontinued Prolia  due to side effects.  She is taking a  calcium supplement daily.   Other medical conditions are listed as follows:   History of migraine  Other fatigue  History of gastroesophageal reflux (GERD)  History of diabetes mellitus  History of depression  Orders: Orders Placed This Encounter  Procedures   Sedimentation rate   CBC with Differential/Platelet   Comprehensive metabolic panel with GFR   Sedimentation rate   C3 and C4   ANA   Serum protein electrophoresis with reflex   No orders of the defined types were placed in this encounter.    Follow-Up Instructions: Return in about 5 months (around 03/19/2024) for Rheumatoid arthritis, Sjogren's syndrome.   Romayne Clubs, PA-C  Note - This record has been created using Dragon software.  Chart creation errors have been sought, but may not always  have been located. Such creation errors do not reflect on  the standard of medical care.

## 2023-10-04 NOTE — Progress Notes (Signed)
   Procedure Note  [name]             Date of Birth: 02-15-1956           MRN: [id number]             Visit Date: 10/04/2023  Procedures: Visit Diagnoses:  1. Primary osteoarthritis of both knees     Orthovisc #1 bilateral knees, B/B Large Joint Inj: bilateral knee on 10/04/2023 1:56 PM Indications: pain Details: 25 G 1.5 in needle, medial approach  Arthrogram: No  Medications (Right): 1.5 mL lidocaine 1 %; 30 mg Hyaluronan 30 MG/2ML Aspirate (Right): 0 mL Medications (Left): 1.5 mL lidocaine 1 %; 30 mg Hyaluronan 30 MG/2ML Aspirate (Left): 0 mL Outcome: tolerated well, no immediate complications Procedure, treatment alternatives, risks and benefits explained, specific risks discussed. Consent was given by the patient.    Patient tolerated the procedures well.  Aftercare was discussed.  Jacinta Martinis, PA-C

## 2023-10-06 DIAGNOSIS — M791 Myalgia, unspecified site: Secondary | ICD-10-CM | POA: Diagnosis not present

## 2023-10-06 DIAGNOSIS — M542 Cervicalgia: Secondary | ICD-10-CM | POA: Diagnosis not present

## 2023-10-06 DIAGNOSIS — G518 Other disorders of facial nerve: Secondary | ICD-10-CM | POA: Diagnosis not present

## 2023-10-06 DIAGNOSIS — G43719 Chronic migraine without aura, intractable, without status migrainosus: Secondary | ICD-10-CM | POA: Diagnosis not present

## 2023-10-11 ENCOUNTER — Ambulatory Visit: Attending: Physician Assistant | Admitting: Physician Assistant

## 2023-10-11 DIAGNOSIS — Z79899 Other long term (current) drug therapy: Secondary | ICD-10-CM | POA: Diagnosis not present

## 2023-10-11 DIAGNOSIS — M170 Bilateral primary osteoarthritis of knee: Secondary | ICD-10-CM | POA: Diagnosis not present

## 2023-10-11 MED ORDER — LIDOCAINE HCL 1 % IJ SOLN
1.5000 mL | INTRAMUSCULAR | Status: AC | PRN
  Administered 2023-10-11: 1.5 mL

## 2023-10-11 MED ORDER — HYALURONAN 30 MG/2ML IX SOSY
30.0000 mg | PREFILLED_SYRINGE | INTRA_ARTICULAR | Status: AC | PRN
  Administered 2023-10-11: 30 mg via INTRA_ARTICULAR

## 2023-10-11 NOTE — Progress Notes (Signed)
   Procedure Note  Patient: TERIKA PILLARD             Date of Birth: 15-Nov-1955           MRN: [id number]             Visit Date: 10/11/2023  Procedures: Visit Diagnoses:  1. High risk medication use   2. Primary osteoarthritis of both knees    Orthovisc #2 bilateral knees, B/B Large Joint Inj: bilateral knee on 10/11/2023 2:20 PM Indications: pain Details: 25 G 1.5 in needle, medial approach  Arthrogram: No  Medications (Right): 1.5 mL lidocaine  1 %; 30 mg Hyaluronan 30 MG/2ML Aspirate (Right): 0 mL Medications (Left): 1.5 mL lidocaine  1 %; 30 mg Hyaluronan 30 MG/2ML Aspirate (Left): 0 mL Outcome: tolerated well, no immediate complications Procedure, treatment alternatives, risks and benefits explained, specific risks discussed. Consent was given by the patient. Immediately prior to procedure a time out was called to verify the correct patient, procedure, equipment, support staff and site/side marked as required.      Patient tolerated the procedures well.  procedure notes were completed above.  Aftercare was discussed.  Jacinta Martinis, PA-C

## 2023-10-12 ENCOUNTER — Telehealth: Payer: Self-pay | Admitting: *Deleted

## 2023-10-12 DIAGNOSIS — Z9189 Other specified personal risk factors, not elsewhere classified: Secondary | ICD-10-CM

## 2023-10-12 DIAGNOSIS — E1040 Type 1 diabetes mellitus with diabetic neuropathy, unspecified: Secondary | ICD-10-CM | POA: Diagnosis not present

## 2023-10-12 DIAGNOSIS — Z794 Long term (current) use of insulin: Secondary | ICD-10-CM | POA: Diagnosis not present

## 2023-10-12 LAB — COMPREHENSIVE METABOLIC PANEL WITH GFR
AG Ratio: 1.5 (calc) (ref 1.0–2.5)
ALT: 9 U/L (ref 6–29)
AST: 16 U/L (ref 10–35)
Albumin: 3.9 g/dL (ref 3.6–5.1)
Alkaline phosphatase (APISO): 60 U/L (ref 37–153)
BUN: 16 mg/dL (ref 7–25)
CO2: 28 mmol/L (ref 20–32)
Calcium: 9.5 mg/dL (ref 8.6–10.4)
Chloride: 107 mmol/L (ref 98–110)
Creat: 0.95 mg/dL (ref 0.50–1.05)
Globulin: 2.6 g/dL (ref 1.9–3.7)
Glucose, Bld: 127 mg/dL — ABNORMAL HIGH (ref 65–99)
Potassium: 4.3 mmol/L (ref 3.5–5.3)
Sodium: 141 mmol/L (ref 135–146)
Total Bilirubin: 0.5 mg/dL (ref 0.2–1.2)
Total Protein: 6.5 g/dL (ref 6.1–8.1)
eGFR: 66 mL/min/{1.73_m2} (ref >=60)

## 2023-10-12 LAB — CBC WITH DIFFERENTIAL/PLATELET
Absolute Lymphocytes: 2088 {cells}/uL (ref 850–3900)
Absolute Monocytes: 371 {cells}/uL (ref 200–950)
Basophils Absolute: 81 {cells}/uL (ref 0–200)
Basophils Relative: 1.4 %
Eosinophils Absolute: 800 {cells}/uL — ABNORMAL HIGH (ref 15–500)
Eosinophils Relative: 13.8 %
HCT: 41.4 % (ref 35.0–45.0)
Hemoglobin: 13.5 g/dL (ref 11.7–15.5)
MCH: 28.9 pg (ref 27.0–33.0)
MCHC: 32.6 g/dL (ref 32.0–36.0)
MCV: 88.7 fL (ref 80.0–100.0)
MPV: 13.2 fL — ABNORMAL HIGH (ref 7.5–12.5)
Monocytes Relative: 6.4 %
Neutro Abs: 2459 {cells}/uL (ref 1500–7800)
Neutrophils Relative %: 42.4 %
Platelets: 263 10*3/uL (ref 140–400)
RBC: 4.67 10*6/uL (ref 3.80–5.10)
RDW: 13.8 % (ref 11.0–15.0)
Total Lymphocyte: 36 %
WBC: 5.8 10*3/uL (ref 3.8–10.8)

## 2023-10-12 NOTE — Progress Notes (Signed)
 Glucose is 127. Rest of CMP WNL CBC stable.

## 2023-10-12 NOTE — Telephone Encounter (Signed)
 Patient left message requesting an order for yearly Breast MRI.

## 2023-10-13 ENCOUNTER — Encounter: Payer: Self-pay | Admitting: Obstetrics and Gynecology

## 2023-10-13 DIAGNOSIS — H43813 Vitreous degeneration, bilateral: Secondary | ICD-10-CM | POA: Diagnosis not present

## 2023-10-13 DIAGNOSIS — H04123 Dry eye syndrome of bilateral lacrimal glands: Secondary | ICD-10-CM | POA: Diagnosis not present

## 2023-10-13 DIAGNOSIS — E103591 Type 1 diabetes mellitus with proliferative diabetic retinopathy without macular edema, right eye: Secondary | ICD-10-CM | POA: Diagnosis not present

## 2023-10-13 DIAGNOSIS — E103512 Type 1 diabetes mellitus with proliferative diabetic retinopathy with macular edema, left eye: Secondary | ICD-10-CM | POA: Diagnosis not present

## 2023-10-13 LAB — HM DIABETES EYE EXAM

## 2023-10-13 NOTE — Telephone Encounter (Signed)
 MRI order placed and msg sent to appt desk to set up B&P.   Spoke w/ the pt and advised that order is placed. She voiced understanding and appreciation for the CB.   Will leave open to document when the pt gets scheduled.

## 2023-10-13 NOTE — Telephone Encounter (Signed)
 Becky [name], CMA Left message for patient to call and schedule appointment.  Pt scheduled for breast MRI on 10/24/2023.

## 2023-10-13 NOTE — Telephone Encounter (Signed)
 LAEX (B&P): 12/18/2020-BS, nothing currently scheduled. Has been seen for OV's since (last 10/20/2022) and recall was sent for B&P in 2024 per EMR.   Pt has increased risk for Br Ca. Last MRI: 10/26/2022 Last mammo: 04/14/2023  Please advise.

## 2023-10-13 NOTE — Telephone Encounter (Signed)
 Please schedule breast MRI with contrast due to increased risk of breast cancer.  28.8% lifetime risk of breast cancer.   Please also schedule breast and pelvic exam with me for after July, 2025.

## 2023-10-17 DIAGNOSIS — H04123 Dry eye syndrome of bilateral lacrimal glands: Secondary | ICD-10-CM | POA: Diagnosis not present

## 2023-10-17 DIAGNOSIS — E10319 Type 1 diabetes mellitus with unspecified diabetic retinopathy without macular edema: Secondary | ICD-10-CM | POA: Diagnosis not present

## 2023-10-17 DIAGNOSIS — Z961 Presence of intraocular lens: Secondary | ICD-10-CM | POA: Diagnosis not present

## 2023-10-18 ENCOUNTER — Encounter: Payer: Self-pay | Admitting: Physician Assistant

## 2023-10-18 ENCOUNTER — Ambulatory Visit: Attending: Physician Assistant | Admitting: Physician Assistant

## 2023-10-18 VITALS — BP 114/73 | HR 88 | Resp 13 | Ht 64.0 in | Wt 126.2 lb

## 2023-10-18 DIAGNOSIS — M170 Bilateral primary osteoarthritis of knee: Secondary | ICD-10-CM | POA: Diagnosis not present

## 2023-10-18 DIAGNOSIS — H209 Unspecified iridocyclitis: Secondary | ICD-10-CM | POA: Diagnosis not present

## 2023-10-18 DIAGNOSIS — M51369 Other intervertebral disc degeneration, lumbar region without mention of lumbar back pain or lower extremity pain: Secondary | ICD-10-CM

## 2023-10-18 DIAGNOSIS — Z8659 Personal history of other mental and behavioral disorders: Secondary | ICD-10-CM

## 2023-10-18 DIAGNOSIS — M5030 Other cervical disc degeneration, unspecified cervical region: Secondary | ICD-10-CM

## 2023-10-18 DIAGNOSIS — Z8719 Personal history of other diseases of the digestive system: Secondary | ICD-10-CM

## 2023-10-18 DIAGNOSIS — Z79899 Other long term (current) drug therapy: Secondary | ICD-10-CM | POA: Diagnosis not present

## 2023-10-18 DIAGNOSIS — M810 Age-related osteoporosis without current pathological fracture: Secondary | ICD-10-CM

## 2023-10-18 DIAGNOSIS — M0579 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 without organ or systems involvement: Secondary | ICD-10-CM | POA: Diagnosis not present

## 2023-10-18 DIAGNOSIS — M65312 Trigger thumb, left thumb: Secondary | ICD-10-CM

## 2023-10-18 DIAGNOSIS — M3501 Sicca syndrome with keratoconjunctivitis: Secondary | ICD-10-CM

## 2023-10-18 DIAGNOSIS — Z8639 Personal history of other endocrine, nutritional and metabolic disease: Secondary | ICD-10-CM

## 2023-10-18 DIAGNOSIS — Z8669 Personal history of other diseases of the nervous system and sense organs: Secondary | ICD-10-CM

## 2023-10-18 DIAGNOSIS — R5383 Other fatigue: Secondary | ICD-10-CM

## 2023-10-18 MED ORDER — HYALURONAN 30 MG/2ML IX SOSY
30.0000 mg | PREFILLED_SYRINGE | INTRA_ARTICULAR | Status: AC | PRN
  Administered 2023-10-18: 30 mg via INTRA_ARTICULAR

## 2023-10-18 MED ORDER — LIDOCAINE HCL 1 % IJ SOLN
1.5000 mL | INTRAMUSCULAR | Status: AC | PRN
  Administered 2023-10-18: 1.5 mL

## 2023-10-18 NOTE — Patient Instructions (Signed)
Standing Labs We placed an order today for your standing lab work.   Please have your standing labs drawn in July and every 3 months   Please have your labs drawn 2 weeks prior to your appointment so that the provider can discuss your lab results at your appointment, if possible.  Please note that you may see your imaging and lab results in MyChart before we have reviewed them. We will contact you once all results are reviewed. Please allow our office up to 72 hours to thoroughly review all of the results before contacting the office for clarification of your results.  WALK-IN LAB HOURS  Monday through Thursday from 8:00 am -12:30 pm and 1:00 pm-5:00 pm and Friday from 8:00 am-12:00 pm.  Patients with office visits requiring labs will be seen before walk-in labs.  You may encounter longer than normal wait times. Please allow additional time. Wait times may be shorter on  Monday and Thursday afternoons.  We do not book appointments for walk-in labs. We appreciate your patience and understanding with our staff.   Labs are drawn by [facility]. Please bring your co-pay at the time of your lab draw.  You may receive a bill from [facility] for your lab work.  Please note if you are on Hydroxychloroquine and and an order has been placed for a Hydroxychloroquine level,  you will need to have it drawn 4 hours or more after your last dose.  If you wish to have your labs drawn at another location, please call the office 24 hours in advance so we can fax the orders.  The office is located at [address], [address], [place],  [address]   If you have any questions regarding directions or hours of operation,  please call [phone].   As a reminder, please drink plenty of water prior to coming for your lab work. Thanks!  

## 2023-10-19 DIAGNOSIS — Z794 Long term (current) use of insulin: Secondary | ICD-10-CM | POA: Diagnosis not present

## 2023-10-19 DIAGNOSIS — E103512 Type 1 diabetes mellitus with proliferative diabetic retinopathy with macular edema, left eye: Secondary | ICD-10-CM | POA: Diagnosis not present

## 2023-10-24 ENCOUNTER — Other Ambulatory Visit

## 2023-10-26 DIAGNOSIS — Z794 Long term (current) use of insulin: Secondary | ICD-10-CM | POA: Diagnosis not present

## 2023-10-27 NOTE — Telephone Encounter (Signed)
 10/13/2023: From PA coordinator, the pt will need to r/s until after 10/26/2023 if she hopes for any coverage from insurance since her last was on 10/26/2022.   Imaging center notified and cancelled current appt and has reached out to the pt to r/s. However, nothing has been scheduled yet.  Spoke w/ the pt on 10/21/2023 and spoke w/ her about why the cancel, r/s was necessary and she voiced understanding and plans to r/s at her earliest convenience. She appreciated the information about contacting [facility] and the f/u at the time of the call.

## 2023-10-27 NOTE — Telephone Encounter (Signed)
[name], Becky  [name], Becky D, CMA [facility]/Two messages were left for patient to call and schedule appointment; patient did not return our call. 

## 2023-10-31 NOTE — Progress Notes (Unsigned)
 Brigitte Canard, PA-C [address] [place], [place]  [address] Phone: [phone]   Gastroenterology Consultation  Referring Provider:     Rosslyn Coons, MD Primary Care Physician:  Rosslyn Coons, MD Primary Gastroenterologist:  Brigitte Canard, PA-C / Legrand Puma, MD  Reason for Consultation:     Worsening constipation        HPI:   Becky [name] is a 68 y.o. y/o female referred for consultation & management  by Rosslyn Coons, MD. She is here to evaluate worsening severe constipation.  She has had constipation since she was a teenager, for 50 years.  However she has had severe worsening constipation for the past 3 or 4 months.  Currently taking 2 scoops of MiraLAX, 2 stool softeners, 1 align probiotic, 4 fiber capsules, and 2-4 Dulcolax every day.  She drinks a lot of water daily.  This treatment is no longer working.  She may have no bowel movement for 7 to 14 days.  She has associated bilateral lower abdominal cramping, bloating, and abdominal swelling.  Denies rectal bleeding or weight loss.  Denies nausea or vomiting.  No family history of colon cancer.  06/2015 Last Colonoscopy by Dr. Elvin Hammer: Normal.  Good prep.  Internal hemorrhoids.  10 year repeat (due 06/2025).  Past Medical History:  Diagnosis Date   Abnormal ECG    Anemia    Anxiety    Cataract    . Cateracts bil eyes removed   Eczema    GERD (gastroesophageal reflux disease)    Heart murmur    History of COVID-19 06/14/2020   Hypercholesterolemia    IDDM (insulin  dependent diabetes mellitus) 1969   Migraine headache    Neuromuscular disorder (HCC)    bil legs & feet - neuropathy   Neuropathy    Osteoporosis    10/2012   Otosclerosis    Pigmented purpura (HCC)    RA (rheumatoid arthritis) (HCC) 2000   S/P cardiac cath 01/2012   Normal coronaries and normal LV function.   Sjoegren syndrome     Past Surgical History:  Procedure Laterality Date   back ablation  03/2020   back ablation  10/09/2020    BREAST LUMPECTOMY  1995   CARPAL TUNNEL RELEASE  2000   CATARACT EXTRACTION Right 09-05-12   CATARACT EXTRACTION Left 3/15   CESAREAN SECTION  1987   COLONOSCOPY     EYE SURGERY     diabetic retinopathy    HYSTEROSCOPY  02/2001   and D & C   LYMPH NODE DISSECTION  2002   TOE SURGERY  2000   bone removed    Prior to Admission medications   Medication Sig Start Date End Date Taking? Authorizing Provider  adalimumab (HUMIRA, 2 PEN,) 40 MG/0.4ML pen Inject 0.4 mLs (40 mg total) into the skin every 14 (fourteen) days. 1 kit - 2 pens 08/22/23   Nicholas Bari, MD  aspirin  81 MG tablet Take 81 mg by mouth every other day.    [provider]  calcium carbonate (OS-CAL) 600 MG TABS Take 600 mg by mouth 2 (two) times daily with a meal.     [provider]  clindamycin (CLEOCIN) 300 MG capsule Take 300 mg by mouth 2 (two) times daily. Patient not taking: Reported on 10/18/2023 07/25/23   [provider]  Continuous Blood Gluc Sensor (FREESTYLE LIBRE 2 SENSOR) MISC CHANGE EVERY 14 DAYS 09/18/20   [provider]  CONTOUR NEXT TEST test strip  10/03/17   [provider]  cyclobenzaprine  (FLEXERIL ) 10 MG tablet Take 10 mg by mouth 3 (three) times daily as needed for muscle spasms. Reported on 07/03/2015    [provider]  DULoxetine  (CYMBALTA ) 30 MG capsule Take 90 mg by mouth daily. 01/04/20   [provider]  famotidine (PEPCID) 20 MG tablet Take 20 mg by mouth 2 (two) times daily. 08/24/21   [provider]  furosemide (LASIX) 20 MG tablet Take 10 mg by mouth daily.     [provider]  hydroxychloroquine  (PLAQUENIL ) 200 MG tablet TAKE ONE TABLET BY MOUTH EVERY DAY Patient not taking: Reported on 10/18/2023 09/12/23   Romayne Clubs, PA-C  insulin  aspart (NOVOLOG ) 100 UNIT/ML injection Insulin  pump    [provider]  losartan  (COZAAR ) 100 MG tablet TAKE ONE (1) TABLET BY MOUTH EVERY DAY for 90    [provider]  losartan  (COZAAR ) 50 MG tablet Take 50 mg by mouth daily. Patient not taking: Reported on 10/18/2023 08/01/20   [provider]  polyethylene glycol (MIRALAX / GLYCOLAX) 17 g packet Take 17 g by mouth 2 (two) times daily.     [provider]  pravastatin  (PRAVACHOL ) 20 MG tablet Take 20 mg by mouth daily.     [provider]  SUMAtriptan (IMITREX) 100 MG tablet Take 100 mg by mouth every 2 (two) hours as needed.    [provider]  topiramate  (TOPAMAX ) 200 MG tablet Take 200 mg by mouth daily.     [provider]  vitamin B-12 (CYANOCOBALAMIN ) 1000 MCG tablet Take 1,000 mcg by mouth daily.    [provider]    Family History  Problem Relation Age of Onset   Breast cancer Mother 23   Colon polyps Mother        unspecified number   Uterine cancer Mother        metastatic   Endometrial cancer Mother 45   Stroke Father 78   Dementia Father    Rheum arthritis Sister    Breast cancer Sister 48       lump; (-)GT (breast/ova ca panel, GeneDx)   Breast cancer Maternal Grandmother 19   Skin cancer Maternal Grandfather        unspecified type   Prostate cancer Maternal Grandfather        dx. late 19s   Breast cancer Cousin        bilateral; dx. late 101s   Breast cancer Other        dx. in their late 60s   Breast cancer Other        dx. 78-79   Colon cancer Neg Hx    Esophageal cancer Neg Hx    Rectal cancer Neg Hx    Stomach cancer Neg Hx      Social History   Tobacco Use   Smoking status: Never    Passive exposure: Past   Smokeless tobacco: Never  Vaping Use   Vaping status: Never Used  Substance Use Topics   Alcohol use: Yes    Alcohol/week: 2.0 standard drinks of alcohol    Types: 1 Shots of liquor, 1 Standard drinks or equivalent per week    Comment: maybe 1-2 cocktails per wk   Drug use: No    Allergies as of 11/01/2023 - Review Complete 11/01/2023  Allergen Reaction Noted   Chlorhexidine Rash 03/03/2020    Denosumab  Other (See Comments) 02/11/2022   Doxycycline Nausea And Vomiting 07/28/2023  Macrobid [nitrofurantoin macrocrystal] Hives 12/10/2018   Molnupiravir  07/19/2022   Ciprofloxacin Rash    Penicillins Rash     Review of Systems:    All systems reviewed and negative except where noted in HPI.   Physical Exam:  BP (!) 140/62   Pulse 83   Ht 5\' 4"  (1.626 m)   Wt 127 lb (57.6 kg)   LMP 09/19/2008   BMI 21.80 kg/m  Patient's last menstrual period was 09/19/2008.  General:   Alert,  Well-developed, well-nourished, pleasant and cooperative in NAD Lungs:  Respirations even and unlabored.  Clear throughout to auscultation.   No wheezes, crackles, or rhonchi. No acute distress. Heart:  Regular rate and rhythm; no murmurs, clicks, rubs, or gallops. Abdomen:  Normal bowel sounds.  No bruits.  Soft, and non-distended without masses, hepatosplenomegaly or hernias noted.  Mild bilateral lower tenderness.  Rest of abdomen is nontender.  No guarding or rebound tenderness.    Neurologic:  Alert and oriented x3;  grossly normal neurologically. Psych:  Alert and cooperative. Normal mood and affect.  Imaging Studies: No results found.  Labs: CBC    Component Value Date/Time   WBC 5.8 10/11/2023 1355   RBC 4.67 10/11/2023 1355   HGB 13.5 10/11/2023 1355   HGB 13.9 05/13/2020 1221   HGB 13.6 01/05/2017 1257   HCT 41.4 10/11/2023 1355   HCT 41.9 01/05/2017 1257   PLT 263 10/11/2023 1355   PLT 236 05/13/2020 1221   PLT 262 01/05/2017 1257   MCV 88.7 10/11/2023 1355   MCV 94.0 01/05/2017 1257   MCH 28.9 10/11/2023 1355   MCHC 32.6 10/11/2023 1355   RDW 13.8 10/11/2023 1355   RDW 16.7 (H) 01/05/2017 1257   LYMPHSABS 1,528 02/23/2023 1310   LYMPHSABS 1.6 01/05/2017 1257   MONOABS 0.4 05/13/2020 1221   MONOABS 0.5 01/05/2017 1257   EOSABS 800 (H) 10/11/2023 1355   EOSABS 0.5 01/05/2017 1257   BASOSABS 81 10/11/2023 1355   BASOSABS 0.1 01/05/2017 1257    CMP     Component  Value Date/Time   NA 141 10/11/2023 1355   NA 142 01/05/2017 1257   K 4.3 10/11/2023 1355   K 4.3 01/05/2017 1257   CL 107 10/11/2023 1355   CO2 28 10/11/2023 1355   CO2 24 01/05/2017 1257   GLUCOSE 127 (H) 10/11/2023 1355   GLUCOSE 95 01/05/2017 1257   BUN 16 10/11/2023 1355   BUN 11.4 01/05/2017 1257   CREATININE 0.95 10/11/2023 1355   CREATININE 1.0 01/05/2017 1257   CALCIUM 9.5 10/11/2023 1355   CALCIUM 9.7 01/05/2017 1257   PROT 6.5 10/11/2023 1355   PROT 7.1 01/05/2017 1257   ALBUMIN 3.9 05/13/2020 1221   ALBUMIN 3.9 01/05/2017 1257   AST 16 10/11/2023 1355   AST 29 05/13/2020 1221   AST 24 01/05/2017 1257   ALT 9 10/11/2023 1355   ALT 27 05/13/2020 1221   ALT 18 01/05/2017 1257   ALKPHOS 79 05/13/2020 1221   ALKPHOS 67 01/05/2017 1257   BILITOT 0.5 10/11/2023 1355   BILITOT 0.5 05/13/2020 1221   BILITOT 0.26 01/05/2017 1257   GFRNONAA 57 (L) 12/17/2020 0000   GFRAA 66 12/17/2020 0000    Assessment and Plan:   SHANDALE DIRK is a 68 y.o. y/o female has been referred for:  IBS-C Chronic Constipation - Worsening, severe, not controlled on OTC meds Change in Bowel Habits 4.   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  Patient will let me know which dose works best, and then we can send a prescription.   - If Linzess is too expensive then try Amitiza. - Continue Miralax, Stool Softener, Align, Fiber, and Dulcolax - Drink 64 ounces fluids daily; High fiber diet. -Scheduling Colonoscopy I discussed risks of colonoscopy with patient to include risk of bleeding, colon perforation, and risk of sedation.  Patient expressed understanding and agrees to proceed with colonoscopy.  -Reach out to Endocrinologist for instructions on adjusting Insulin  Pump during Colonoscopy Prep.  Follow up 4 weeks after Colonoscopy with TG.  Brigitte Canard, PA-C

## 2023-11-01 ENCOUNTER — Other Ambulatory Visit: Payer: Self-pay

## 2023-11-01 ENCOUNTER — Ambulatory Visit: Admitting: Physician Assistant

## 2023-11-01 ENCOUNTER — Encounter: Payer: Self-pay | Admitting: Physician Assistant

## 2023-11-01 ENCOUNTER — Other Ambulatory Visit (INDEPENDENT_AMBULATORY_CARE_PROVIDER_SITE_OTHER)

## 2023-11-01 VITALS — BP 140/62 | HR 83 | Ht 64.0 in | Wt 127.0 lb

## 2023-11-01 DIAGNOSIS — R194 Change in bowel habit: Secondary | ICD-10-CM

## 2023-11-01 DIAGNOSIS — K5909 Other constipation: Secondary | ICD-10-CM | POA: Diagnosis not present

## 2023-11-01 DIAGNOSIS — K5904 Chronic idiopathic constipation: Secondary | ICD-10-CM | POA: Diagnosis not present

## 2023-11-01 DIAGNOSIS — M0579 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 without organ or systems involvement: Secondary | ICD-10-CM

## 2023-11-01 DIAGNOSIS — K581 Irritable bowel syndrome with constipation: Secondary | ICD-10-CM

## 2023-11-01 DIAGNOSIS — H209 Unspecified iridocyclitis: Secondary | ICD-10-CM

## 2023-11-01 DIAGNOSIS — Z79899 Other long term (current) drug therapy: Secondary | ICD-10-CM

## 2023-11-01 LAB — TSH: TSH: 2.72 u[IU]/mL (ref 0.35–5.50)

## 2023-11-01 MED ORDER — HUMIRA (2 PEN) 40 MG/0.4ML ~~LOC~~ AJKT: 40.0000 mg | AUTO-INJECTOR | SUBCUTANEOUS | 0 refills | Status: DC

## 2023-11-01 MED ORDER — NA SULFATE-K SULFATE-MG SULF 17.5-3.13-1.6 GM/177ML PO SOLN
1.0000 | Freq: Once | ORAL | 0 refills | Status: AC
Start: 2023-11-01 — End: 2023-11-01

## 2023-11-01 NOTE — Telephone Encounter (Signed)
 Breast MRI now scheduled for 6/1. Encounter closed.

## 2023-11-01 NOTE — Patient Instructions (Signed)
 Your provider has requested that you go to the basement level for lab work before leaving today. Press "B" on the elevator. The lab is located at the first door on the left as you exit the elevator.  We have given you samples of the following medication to take: Linzess 145 mcg and 290 mcg take as directed.  Linzess works best when taken once a day every day, on an empty stomach, at least 30 minutes before your first meal of the day.  When Linzess is taken daily as directed:  *Constipation relief is typically felt in about a week *IBS-C patients may begin to experience relief from belly pain and overall abdominal symptoms (pain, discomfort, and bloating) in about 1 week,   with symptoms typically improving over 12 weeks.  Diarrhea may occur in the first 2 weeks -keep taking it.  The diarrhea should go away and you should start having normal, complete, full bowel movements. It may be helpful to start treatment when you can be near the comfort of your own bathroom, such as a weekend.   You have been scheduled for a colonoscopy. Please follow written instructions given to you at your visit today.   If you use inhalers (even only as needed), please bring them with you on the day of your procedure.  DO NOT TAKE 7 DAYS PRIOR TO TEST- Trulicity (dulaglutide) Ozempic, Wegovy (semaglutide) Mounjaro (tirzepatide) Bydureon Bcise (exanatide extended release)  DO NOT TAKE 1 DAY PRIOR TO YOUR TEST Rybelsus (semaglutide) Adlyxin (lixisenatide) Victoza (liraglutide) Byetta (exanatide) ___________________________________________________________________________  Please follow up sooner if symptoms increase or worsen   Due to recent changes in healthcare laws, you may see the results of your imaging and laboratory studies on MyChart before your provider has had a chance to review them.  We understand that in some cases there may be results that are confusing or concerning to you. Not all laboratory  results come back in the same time frame and the provider may be waiting for multiple results in order to interpret others.  Please give us  48 hours in order for your provider to thoroughly review all the results before contacting the office for clarification of your results.   _______________________________________________________  If your blood pressure at your visit was 140/90 or greater, please contact your primary care physician to follow up on this.  _______________________________________________________  If you are age 67 or older, your body mass index should be between 23-30. Your Body mass index is 21.8 kg/m. If this is out of the aforementioned range listed, please consider follow up with your Primary Care Provider.  If you are age 67 or younger, your body mass index should be between 19-25. Your Body mass index is 21.8 kg/m. If this is out of the aformentioned range listed, please consider follow up with your Primary Care Provider.   ________________________________________________________  The [facility] providers would like to encourage you to use MYCHART to communicate with providers for non-urgent requests or questions.  Due to long hold times on the telephone, sending your provider a message by [facility] may be a faster and more efficient way to get a response.  Please allow 48 business hours for a response.  Please remember that this is for non-urgent requests.  _______________________________________________________ Thank you for trusting me with your gastrointestinal care!   Brigitte Canard, PA

## 2023-11-01 NOTE — Telephone Encounter (Signed)
 Refill request received via fax from [facility] for Humira   Last Fill: 08/22/2023  Labs: 10/11/2023 Glucose is 127. Rest of CMP WNL CBC stable.  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

## 2023-11-02 ENCOUNTER — Ambulatory Visit: Payer: Self-pay | Admitting: Physician Assistant

## 2023-11-02 DIAGNOSIS — E1040 Type 1 diabetes mellitus with diabetic neuropathy, unspecified: Secondary | ICD-10-CM | POA: Diagnosis not present

## 2023-11-02 DIAGNOSIS — I129 Hypertensive chronic kidney disease with stage 1 through stage 4 chronic kidney disease, or unspecified chronic kidney disease: Secondary | ICD-10-CM | POA: Diagnosis not present

## 2023-11-02 DIAGNOSIS — Z794 Long term (current) use of insulin: Secondary | ICD-10-CM | POA: Diagnosis not present

## 2023-11-08 NOTE — Progress Notes (Signed)
 Noted

## 2023-11-09 DIAGNOSIS — Z794 Long term (current) use of insulin: Secondary | ICD-10-CM | POA: Diagnosis not present

## 2023-11-16 DIAGNOSIS — Z794 Long term (current) use of insulin: Secondary | ICD-10-CM | POA: Diagnosis not present

## 2023-11-17 ENCOUNTER — Telehealth: Payer: Self-pay | Admitting: Physician Assistant

## 2023-11-17 ENCOUNTER — Other Ambulatory Visit: Payer: Self-pay

## 2023-11-17 DIAGNOSIS — K5904 Chronic idiopathic constipation: Secondary | ICD-10-CM

## 2023-11-17 DIAGNOSIS — R194 Change in bowel habit: Secondary | ICD-10-CM

## 2023-11-17 DIAGNOSIS — K581 Irritable bowel syndrome with constipation: Secondary | ICD-10-CM

## 2023-11-17 MED ORDER — LINACLOTIDE 290 MCG PO CAPS: 290.0000 ug | ORAL_CAPSULE | Freq: Every day | ORAL | 3 refills | Status: AC

## 2023-11-17 NOTE — Telephone Encounter (Signed)
 Patient called and stated that she was given sample of the Linzess and she would need to call us  back after 2 weeks of trying the medication. Patient stated that she has noticed that the medication has worked well and would like a prescription for it sent over to her pharmacy [facility] in [place]. Patient also mentioned that she would like the strongest dose which is 290 MCG. [facility] is located at [address], [place], [place] [address] Phone: [phone]. Please advise.

## 2023-11-20 ENCOUNTER — Ambulatory Visit
Admission: RE | Admit: 2023-11-20 | Discharge: 2023-11-20 | Disposition: A | Discharge status: Home / Self Care | Source: Ambulatory Visit | Attending: Obstetrics and Gynecology | Admitting: Obstetrics and Gynecology

## 2023-11-20 DIAGNOSIS — Z9189 Other specified personal risk factors, not elsewhere classified: Secondary | ICD-10-CM

## 2023-11-20 DIAGNOSIS — Z1239 Encounter for other screening for malignant neoplasm of breast: Secondary | ICD-10-CM | POA: Diagnosis not present

## 2023-11-20 MED ORDER — GADOPICLENOL 0.5 MMOL/ML IV SOLN
5.0000 mL | Freq: Once | INTRAVENOUS | Status: AC | PRN
  Administered 2023-11-20: 5 mL via INTRAVENOUS

## 2023-11-22 ENCOUNTER — Ambulatory Visit: Payer: Self-pay | Admitting: Obstetrics and Gynecology

## 2023-11-23 DIAGNOSIS — Z794 Long term (current) use of insulin: Secondary | ICD-10-CM | POA: Diagnosis not present

## 2023-11-24 DIAGNOSIS — G43719 Chronic migraine without aura, intractable, without status migrainosus: Secondary | ICD-10-CM | POA: Diagnosis not present

## 2023-11-24 DIAGNOSIS — G518 Other disorders of facial nerve: Secondary | ICD-10-CM | POA: Diagnosis not present

## 2023-11-24 DIAGNOSIS — M542 Cervicalgia: Secondary | ICD-10-CM | POA: Diagnosis not present

## 2023-11-24 DIAGNOSIS — M791 Myalgia, unspecified site: Secondary | ICD-10-CM | POA: Diagnosis not present

## 2023-11-28 ENCOUNTER — Other Ambulatory Visit: Payer: Self-pay | Admitting: *Deleted

## 2023-11-28 ENCOUNTER — Telehealth: Payer: Self-pay | Admitting: Rheumatology

## 2023-11-28 MED ORDER — PREDNISONE 5 MG PO TABS: ORAL_TABLET | ORAL | 0 refills | Status: DC

## 2023-11-28 NOTE — Telephone Encounter (Signed)
 Please clarify if the patient is able to adjust insulin  if given a prescription for prednisone?  Dr. Alvira Josephs recommended low dose prednisone 5 mg daily if insulin  can be adjusted.   Ok to take prednisone 5 mg daily x1 week then reduce to 2.5 mg daily.

## 2023-11-28 NOTE — Telephone Encounter (Signed)
 I called patient, patient is able to adjust insulin , patient verbalized understanding, RX sent to Jacinta Martinis, PA to approve and send.

## 2023-11-28 NOTE — Telephone Encounter (Signed)
 Pt called stating her hand is in pain and is having a flare up. Pt would like to know if she would need to come in for this or if she can get medication prescribed to her. Pt would like a call back.

## 2023-11-30 DIAGNOSIS — E103591 Type 1 diabetes mellitus with proliferative diabetic retinopathy without macular edema, right eye: Secondary | ICD-10-CM | POA: Diagnosis not present

## 2023-11-30 DIAGNOSIS — H43813 Vitreous degeneration, bilateral: Secondary | ICD-10-CM | POA: Diagnosis not present

## 2023-11-30 DIAGNOSIS — Z794 Long term (current) use of insulin: Secondary | ICD-10-CM | POA: Diagnosis not present

## 2023-11-30 DIAGNOSIS — H04123 Dry eye syndrome of bilateral lacrimal glands: Secondary | ICD-10-CM | POA: Diagnosis not present

## 2023-11-30 DIAGNOSIS — E103512 Type 1 diabetes mellitus with proliferative diabetic retinopathy with macular edema, left eye: Secondary | ICD-10-CM | POA: Diagnosis not present

## 2023-11-30 DIAGNOSIS — Z9889 Other specified postprocedural states: Secondary | ICD-10-CM | POA: Diagnosis not present

## 2023-12-07 DIAGNOSIS — Z794 Long term (current) use of insulin: Secondary | ICD-10-CM | POA: Diagnosis not present

## 2023-12-14 DIAGNOSIS — Z794 Long term (current) use of insulin: Secondary | ICD-10-CM | POA: Diagnosis not present

## 2023-12-20 ENCOUNTER — Encounter: Payer: Self-pay | Admitting: Internal Medicine

## 2023-12-21 DIAGNOSIS — Z794 Long term (current) use of insulin: Secondary | ICD-10-CM | POA: Diagnosis not present

## 2023-12-27 DIAGNOSIS — R0981 Nasal congestion: Secondary | ICD-10-CM | POA: Diagnosis not present

## 2023-12-27 DIAGNOSIS — J3489 Other specified disorders of nose and nasal sinuses: Secondary | ICD-10-CM | POA: Diagnosis not present

## 2023-12-27 DIAGNOSIS — J320 Chronic maxillary sinusitis: Secondary | ICD-10-CM | POA: Diagnosis not present

## 2023-12-27 DIAGNOSIS — J323 Chronic sphenoidal sinusitis: Secondary | ICD-10-CM | POA: Diagnosis not present

## 2023-12-27 DIAGNOSIS — J019 Acute sinusitis, unspecified: Secondary | ICD-10-CM | POA: Diagnosis not present

## 2023-12-28 DIAGNOSIS — Z794 Long term (current) use of insulin: Secondary | ICD-10-CM | POA: Diagnosis not present

## 2023-12-29 ENCOUNTER — Ambulatory Visit: Admitting: Internal Medicine

## 2023-12-29 ENCOUNTER — Encounter: Payer: Self-pay | Admitting: Internal Medicine

## 2023-12-29 VITALS — BP 152/67 | HR 82 | Temp 98.1°F | Resp 14 | Ht 64.0 in | Wt 127.0 lb

## 2023-12-29 DIAGNOSIS — Z860100 Personal history of colon polyps, unspecified: Secondary | ICD-10-CM

## 2023-12-29 DIAGNOSIS — Z1211 Encounter for screening for malignant neoplasm of colon: Secondary | ICD-10-CM | POA: Diagnosis not present

## 2023-12-29 DIAGNOSIS — K581 Irritable bowel syndrome with constipation: Secondary | ICD-10-CM

## 2023-12-29 DIAGNOSIS — K5900 Constipation, unspecified: Secondary | ICD-10-CM

## 2023-12-29 DIAGNOSIS — R194 Change in bowel habit: Secondary | ICD-10-CM

## 2023-12-29 DIAGNOSIS — Z860101 Personal history of adenomatous and serrated colon polyps: Secondary | ICD-10-CM | POA: Diagnosis not present

## 2023-12-29 DIAGNOSIS — K648 Other hemorrhoids: Secondary | ICD-10-CM

## 2023-12-29 DIAGNOSIS — D175 Benign lipomatous neoplasm of intra-abdominal organs: Secondary | ICD-10-CM | POA: Diagnosis not present

## 2023-12-29 DIAGNOSIS — K644 Residual hemorrhoidal skin tags: Secondary | ICD-10-CM | POA: Diagnosis not present

## 2023-12-29 DIAGNOSIS — K5904 Chronic idiopathic constipation: Secondary | ICD-10-CM

## 2023-12-29 MED ORDER — SODIUM CHLORIDE 0.9 % IV SOLN: 500.0000 mL | INTRAVENOUS | Status: DC

## 2023-12-29 NOTE — Patient Instructions (Signed)
 Handout on hemorrhoids given to patient Resume previous diet and continue present medications Repeat colonoscopy in 10 year for surveillance    YOU HAD AN ENDOSCOPIC PROCEDURE TODAY AT THE [facility]:   Refer to the procedure report that was given to you for any specific questions about what was found during the examination.  If the procedure report does not answer your questions, please call your gastroenterologist to clarify.  If you requested that your care partner not be given the details of your procedure findings, then the procedure report has been included in a sealed envelope for you to review at your convenience later.  YOU SHOULD EXPECT: Some feelings of bloating in the abdomen. Passage of more gas than usual.  Walking can help get rid of the air that was put into your GI tract during the procedure and reduce the bloating. If you had a lower endoscopy (such as a colonoscopy or flexible sigmoidoscopy) you may notice spotting of blood in your stool or on the toilet paper. If you underwent a bowel prep for your procedure, you may not have a normal bowel movement for a few days.  Please Note:  You might notice some irritation and congestion in your nose or some drainage.  This is from the oxygen used during your procedure.  There is no need for concern and it should clear up in a day or so.  SYMPTOMS TO REPORT IMMEDIATELY:  Following lower endoscopy (colonoscopy or flexible sigmoidoscopy):  Excessive amounts of blood in the stool  Significant tenderness or worsening of abdominal pains  Swelling of the abdomen that is new, acute  Fever of 100F or higher  For urgent or emergent issues, a gastroenterologist can be reached at any hour by calling [phone]. Do not use MyChart messaging for urgent concerns.    DIET:  We do recommend a small meal at first, but then you may proceed to your regular diet.  Drink plenty of fluids but you should avoid alcoholic beverages for 24  hours.  ACTIVITY:  You should plan to take it easy for the rest of today and you should NOT DRIVE or use heavy machinery until tomorrow (because of the sedation medicines used during the test).    FOLLOW UP: Our staff will call the number listed on your records the next business day following your procedure.  We will call around 7:15- 8:00 am to check on you and address any questions or concerns that you may have regarding the information given to you following your procedure. If we do not reach you, we will leave a message.     If any biopsies were taken you will be contacted by phone or by letter within the next 1-3 weeks.  Please call us  at [phone] if you have not heard about the biopsies in 3 weeks.    SIGNATURES/CONFIDENTIALITY: You and/or your care partner have signed paperwork which will be entered into your electronic medical record.  These signatures attest to the fact that that the information above on your After Visit Summary has been reviewed and is understood.  Full responsibility of the confidentiality of this discharge information lies with you and/or your care-partner.

## 2023-12-29 NOTE — Progress Notes (Signed)
 Sedate, gd SR, tolerated procedure well, VSS, report to RN

## 2023-12-29 NOTE — Op Note (Signed)
 [facility] Patient Name: Becky [name] Procedure Date: 12/29/2023 9:19 AM MRN: [id number] Endoscopist: Norleen SAILOR. Abran , MD, [id number] Age: 68 Referring MD:  Date of Birth: [birth year] Gender: Female Account #: [id number] Procedure:                Colonoscopy Indications:              High risk colon cancer surveillance: Personal                            history of non-advanced adenoma, worsening                            constipation, abdominal discomfort. Previous                            examinations 2005, 2011, 2017 Medicines:                Monitored Anesthesia Care Procedure:                Pre-Anesthesia Assessment:                           - Prior to the procedure, a History and Physical                            was performed, and patient medications and                            allergies were reviewed. The patient's tolerance of                            previous anesthesia was also reviewed. The risks                            and benefits of the procedure and the sedation                            options and risks were discussed with the patient.                            All questions were answered, and informed consent                            was obtained. Prior Anticoagulants: The patient has                            taken no anticoagulant or antiplatelet agents. ASA                            Grade Assessment: III - A patient with severe                            systemic disease. After reviewing the risks and  benefits, the patient was deemed in satisfactory                            condition to undergo the procedure.                           After obtaining informed consent, the colonoscope                            was passed under direct vision. Throughout the                            procedure, the patient's blood pressure, pulse, and                            oxygen saturations were monitored  continuously. The                            CF [id number] #[id number] was introduced through the anus                            and advanced to the the cecum, identified by                            appendiceal orifice and ileocecal valve. The                            ileocecal valve, appendiceal orifice, and rectum                            were photographed. The quality of the bowel                            preparation was excellent. The colonoscopy was                            performed without difficulty. The patient tolerated                            the procedure well. The bowel preparation used was                            SUPREP via split dose instruction. Scope In: 9:34:51 AM Scope Out: 9:54:04 AM Scope Withdrawal Time: 0 hours 11 minutes 1 second  Total Procedure Duration: 0 hours 19 minutes 13 seconds  Findings:                 The entire examined colon appeared normal on direct                            and retroflexion views. Incidental cecal lipoma.                           External and internal hemorrhoids were found during  retroflexion. Complications:            No immediate complications. Estimated blood loss:                            None. Estimated Blood Loss:     Estimated blood loss: none. Impression:               - The entire examined colon is normal on direct and                            retroflexion views.                           - External and internal hemorrhoids. Incidental                            cecal lipoma.                           - No specimens collected. Recommendation:           - Repeat colonoscopy in 10 years for surveillance.                           - Patient has a contact number available for                            emergencies. The signs and symptoms of potential                            delayed complications were discussed with the                            patient. Return to normal  activities tomorrow.                            Written discharge instructions were provided to the                            patient.                           - Resume previous diet.                           - Continue present medications. Norleen SAILOR. Abran, MD 12/29/2023 9:59:10 AM This report has been signed electronically.

## 2023-12-29 NOTE — Progress Notes (Signed)
 Expand All Collapse All       Ellouise Console, PA-C [address] [place], [place]  [address] Phone: [phone]   Gastroenterology Consultation   Referring Provider:     Nichole Senior, MD Primary Care Physician:  Nichole Senior, MD Primary Gastroenterologist:  Ellouise Console, PA-C / Norleen Kiang, MD  Reason for Consultation:     Worsening constipation        HPI:   Becky [name] is a 68 y.o. y/o female referred for consultation & management  by Nichole Senior, MD. She is here to evaluate worsening severe constipation.  She has had constipation since she was a teenager, for 50 years.  However she has had severe worsening constipation for the past 3 or 4 months.  Currently taking 2 scoops of MiraLAX, 2 stool softeners, 1 align probiotic, 4 fiber capsules, and 2-4 Dulcolax every day.  She drinks a lot of water daily.  This treatment is no longer working.  She may have no bowel movement for 7 to 14 days.  She has associated bilateral lower abdominal cramping, bloating, and abdominal swelling.  Denies rectal bleeding or weight loss.  Denies nausea or vomiting.  No family history of colon cancer.   06/2015 Last Colonoscopy by Dr. Kiang: Normal.  Good prep.  Internal hemorrhoids.  10 year repeat (due 06/2025).       Past Medical History:  Diagnosis Date   Abnormal ECG     Anemia     Anxiety     Cataract      . Cateracts bil eyes removed   Eczema     GERD (gastroesophageal reflux disease)     Heart murmur     History of COVID-19 06/14/2020   Hypercholesterolemia     IDDM (insulin  dependent diabetes mellitus) 1969   Migraine headache     Neuromuscular disorder (HCC)      bil legs & feet - neuropathy   Neuropathy     Osteoporosis      10/2012   Otosclerosis     Pigmented purpura (HCC)     RA (rheumatoid arthritis) (HCC) 2000   S/P cardiac cath 01/2012    Normal coronaries and normal LV function.   Sjoegren syndrome                 Past Surgical History:  Procedure  Laterality Date   back ablation   03/2020   back ablation   10/09/2020   BREAST LUMPECTOMY   1995   CARPAL TUNNEL RELEASE   2000   CATARACT EXTRACTION Right 09-05-12   CATARACT EXTRACTION Left 3/15   CESAREAN SECTION   1987   COLONOSCOPY       EYE SURGERY        diabetic retinopathy    HYSTEROSCOPY   02/2001    and D & C   LYMPH NODE DISSECTION   2002   TOE SURGERY   2000    bone removed                 Prior to Admission medications   Medication Sig Start Date End Date Taking? Authorizing Provider  adalimumab  (HUMIRA , 2 PEN,) 40 MG/0.4ML pen Inject 0.4 mLs (40 mg total) into the skin every 14 (fourteen) days. 1 kit - 2 pens 08/22/23     Dolphus Reiter, MD  aspirin  81 MG tablet Take 81 mg by mouth every other day.       [provider]  calcium carbonate (  OS-CAL) 600 MG TABS Take 600 mg by mouth 2 (two) times daily with a meal.        [provider]  clindamycin (CLEOCIN) 300 MG capsule Take 300 mg by mouth 2 (two) times daily. Patient not taking: Reported on 10/18/2023 07/25/23     [provider]  Continuous Blood Gluc Sensor (FREESTYLE LIBRE 2 SENSOR) MISC CHANGE EVERY 14 DAYS 09/18/20     [provider]  CONTOUR NEXT TEST test strip   10/03/17     [provider]  cyclobenzaprine  (FLEXERIL ) 10 MG tablet Take 10 mg by mouth 3 (three) times daily as needed for muscle spasms. Reported on 07/03/2015       [provider]  DULoxetine  (CYMBALTA ) 30 MG capsule Take 90 mg by mouth daily. 01/04/20     [provider]  famotidine (PEPCID) 20 MG tablet Take 20 mg by mouth 2 (two) times daily. 08/24/21     [provider]  furosemide (LASIX) 20 MG tablet Take 10 mg by mouth daily.        [provider]  hydroxychloroquine  (PLAQUENIL ) 200 MG tablet TAKE ONE TABLET BY MOUTH EVERY DAY Patient not taking: Reported on 10/18/2023 09/12/23     Cheryl Waddell HERO, PA-C  insulin  aspart (NOVOLOG ) 100 UNIT/ML injection Insulin  pump        [provider]  losartan  (COZAAR ) 100 MG tablet TAKE ONE (1) TABLET BY MOUTH EVERY DAY for 90       [provider]  losartan  (COZAAR ) 50 MG tablet Take 50 mg by mouth daily. Patient not taking: Reported on 10/18/2023 08/01/20     [provider]  polyethylene glycol (MIRALAX / GLYCOLAX) 17 g packet Take 17 g by mouth 2 (two) times daily.        [provider]  pravastatin  (PRAVACHOL ) 20 MG tablet Take 20 mg by mouth daily.        [provider]  SUMAtriptan (IMITREX) 100 MG tablet Take 100 mg by mouth every 2 (two) hours as needed.       [provider]  topiramate  (TOPAMAX ) 200 MG tablet Take 200 mg by mouth daily.        [provider]  vitamin B-12 (CYANOCOBALAMIN ) 1000 MCG tablet Take 1,000 mcg by mouth daily.       [provider]           Family History  Problem Relation Age of Onset   Breast cancer Mother 81   Colon polyps Mother          unspecified number   Uterine cancer Mother          metastatic   Endometrial cancer Mother 62   Stroke Father 68   Dementia Father     Rheum arthritis Sister     Breast cancer Sister 48        lump; (-)GT (breast/ova ca panel, GeneDx)   Breast cancer Maternal Grandmother 23   Skin cancer Maternal Grandfather          unspecified type   Prostate cancer Maternal Grandfather          dx. late 77s   Breast cancer Cousin          bilateral; dx. late 42s   Breast cancer Other          dx. in their late 60s   Breast cancer Other          dx.  78-79   Colon cancer Neg Hx     Esophageal cancer Neg Hx     Rectal cancer Neg Hx     Stomach cancer Neg Hx            Social History  Social History         Tobacco Use   Smoking status: Never      Passive exposure: Past   Smokeless tobacco: Never  Vaping Use   Vaping status: Never Used  Substance Use Topics   Alcohol use: Yes      Alcohol/week: 2.0 standard drinks of alcohol      Types: 1 Shots of liquor, 1  Standard drinks or equivalent per week      Comment: maybe 1-2 cocktails per wk   Drug use: No             Allergies as of 11/01/2023 - Review Complete 11/01/2023  Allergen Reaction Noted   Chlorhexidine Rash 03/03/2020   Denosumab  Other (See Comments) 02/11/2022   Doxycycline Nausea And Vomiting 07/28/2023   Macrobid [nitrofurantoin macrocrystal] Hives 12/10/2018   Molnupiravir   07/19/2022   Ciprofloxacin Rash     Penicillins Rash        Review of Systems:    All systems reviewed and negative except where noted in HPI.    Physical Exam:  BP (!) 140/62   Pulse 83   Ht 5' 4 (1.626 m)   Wt 127 lb (57.6 kg)   LMP 09/19/2008   BMI 21.80 kg/m  Patient's last menstrual period was 09/19/2008.   General:   Alert,  Well-developed, well-nourished, pleasant and cooperative in NAD Lungs:  Respirations even and unlabored.  Clear throughout to auscultation.   No wheezes, crackles, or rhonchi. No acute distress. Heart:  Regular rate and rhythm; no murmurs, clicks, rubs, or gallops. Abdomen:  Normal bowel sounds.  No bruits.  Soft, and non-distended without masses, hepatosplenomegaly or hernias noted.  Mild bilateral lower tenderness.  Rest of abdomen is nontender.  No guarding or rebound tenderness.    Neurologic:  Alert and oriented x3;  grossly normal neurologically. Psych:  Alert and cooperative. Normal mood and affect.   Imaging Studies: Imaging Results  No results found.     Labs: CBC Labs (Brief)          Component Value Date/Time    WBC 5.8 10/11/2023 1355    RBC 4.67 10/11/2023 1355    HGB 13.5 10/11/2023 1355    HGB 13.9 05/13/2020 1221    HGB 13.6 01/05/2017 1257    HCT 41.4 10/11/2023 1355    HCT 41.9 01/05/2017 1257    PLT 263 10/11/2023 1355    PLT 236 05/13/2020 1221    PLT 262 01/05/2017 1257    MCV 88.7 10/11/2023 1355    MCV 94.0 01/05/2017 1257    MCH 28.9 10/11/2023 1355    MCHC 32.6 10/11/2023 1355    RDW 13.8 10/11/2023 1355    RDW 16.7 (H)  01/05/2017 1257    LYMPHSABS 1,528 02/23/2023 1310    LYMPHSABS 1.6 01/05/2017 1257    MONOABS 0.4 05/13/2020 1221    MONOABS 0.5 01/05/2017 1257    EOSABS 800 (H) 10/11/2023 1355    EOSABS 0.5 01/05/2017 1257    BASOSABS 81 10/11/2023 1355    BASOSABS 0.1 01/05/2017 1257        CMP     Labs (Brief)          Component Value  Date/Time    NA 141 10/11/2023 1355    NA 142 01/05/2017 1257    K 4.3 10/11/2023 1355    K 4.3 01/05/2017 1257    CL 107 10/11/2023 1355    CO2 28 10/11/2023 1355    CO2 24 01/05/2017 1257    GLUCOSE 127 (H) 10/11/2023 1355    GLUCOSE 95 01/05/2017 1257    BUN 16 10/11/2023 1355    BUN 11.4 01/05/2017 1257    CREATININE 0.95 10/11/2023 1355    CREATININE 1.0 01/05/2017 1257    CALCIUM 9.5 10/11/2023 1355    CALCIUM 9.7 01/05/2017 1257    PROT 6.5 10/11/2023 1355    PROT 7.1 01/05/2017 1257    ALBUMIN 3.9 05/13/2020 1221    ALBUMIN 3.9 01/05/2017 1257    AST 16 10/11/2023 1355    AST 29 05/13/2020 1221    AST 24 01/05/2017 1257    ALT 9 10/11/2023 1355    ALT 27 05/13/2020 1221    ALT 18 01/05/2017 1257    ALKPHOS 79 05/13/2020 1221    ALKPHOS 67 01/05/2017 1257    BILITOT 0.5 10/11/2023 1355    BILITOT 0.5 05/13/2020 1221    BILITOT 0.26 01/05/2017 1257    GFRNONAA 57 (L) 12/17/2020 0000    GFRAA 66 12/17/2020 0000        Assessment and Plan:    Becky [name] is a 68 y.o. y/o female has been referred for:   IBS-C Chronic Constipation - Worsening, severe, not controlled on OTC meds Change in Bowel Habits 4.   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  Patient will let me know which dose works best, and then we can send a prescription.   - If Linzess  is too expensive then try Amitiza. - Continue Miralax, Stool Softener, Align, Fiber, and Dulcolax - Drink 64 ounces fluids daily; High fiber diet. -Scheduling Colonoscopy I  discussed risks of colonoscopy with patient to include risk of bleeding, colon perforation, and risk of sedation.  Patient expressed understanding and agrees to proceed with colonoscopy.  -Reach out to Endocrinologist for instructions on adjusting Insulin  Pump during Colonoscopy Prep.   Follow up 4 weeks after Colonoscopy with TG.   Ellouise Console, PA-C    Recent H&P as above.  No interval change.  Now for colonoscopy

## 2023-12-30 ENCOUNTER — Telehealth: Payer: Self-pay | Admitting: *Deleted

## 2023-12-30 NOTE — Telephone Encounter (Signed)
  Follow up Call-     12/29/2023    8:05 AM  Call back number  Post procedure Call Back phone  # [phone]  Permission to leave phone message Yes     Patient questions:  Do you have a fever, pain , or abdominal swelling? No. Pain Score  0 *  Have you tolerated food without any problems? Yes.    Have you been able to return to your normal activities? Yes.    Do you have any questions about your discharge instructions: Diet   No. Medications  No. Follow up visit  No.  Do you have questions or concerns about your Care? No.  Actions: * If pain score is 4 or above: No action needed, pain <4.

## 2024-01-04 DIAGNOSIS — H04123 Dry eye syndrome of bilateral lacrimal glands: Secondary | ICD-10-CM | POA: Diagnosis not present

## 2024-01-04 DIAGNOSIS — H43811 Vitreous degeneration, right eye: Secondary | ICD-10-CM | POA: Diagnosis not present

## 2024-01-04 DIAGNOSIS — E103512 Type 1 diabetes mellitus with proliferative diabetic retinopathy with macular edema, left eye: Secondary | ICD-10-CM | POA: Diagnosis not present

## 2024-01-04 DIAGNOSIS — H43812 Vitreous degeneration, left eye: Secondary | ICD-10-CM | POA: Diagnosis not present

## 2024-01-04 DIAGNOSIS — Z794 Long term (current) use of insulin: Secondary | ICD-10-CM | POA: Diagnosis not present

## 2024-01-04 DIAGNOSIS — H43813 Vitreous degeneration, bilateral: Secondary | ICD-10-CM | POA: Diagnosis not present

## 2024-01-04 DIAGNOSIS — E103591 Type 1 diabetes mellitus with proliferative diabetic retinopathy without macular edema, right eye: Secondary | ICD-10-CM | POA: Diagnosis not present

## 2024-01-05 DIAGNOSIS — G518 Other disorders of facial nerve: Secondary | ICD-10-CM | POA: Diagnosis not present

## 2024-01-05 DIAGNOSIS — M791 Myalgia, unspecified site: Secondary | ICD-10-CM | POA: Diagnosis not present

## 2024-01-05 DIAGNOSIS — I129 Hypertensive chronic kidney disease with stage 1 through stage 4 chronic kidney disease, or unspecified chronic kidney disease: Secondary | ICD-10-CM | POA: Diagnosis not present

## 2024-01-05 DIAGNOSIS — M542 Cervicalgia: Secondary | ICD-10-CM | POA: Diagnosis not present

## 2024-01-05 DIAGNOSIS — G43719 Chronic migraine without aura, intractable, without status migrainosus: Secondary | ICD-10-CM | POA: Diagnosis not present

## 2024-01-05 DIAGNOSIS — E1040 Type 1 diabetes mellitus with diabetic neuropathy, unspecified: Secondary | ICD-10-CM | POA: Diagnosis not present

## 2024-01-09 ENCOUNTER — Encounter: Payer: Self-pay | Admitting: Neurology

## 2024-01-09 ENCOUNTER — Ambulatory Visit: Admitting: Neurology

## 2024-01-09 ENCOUNTER — Other Ambulatory Visit: Payer: Self-pay

## 2024-01-09 VITALS — BP 112/69 | HR 85 | Ht 64.0 in | Wt 124.2 lb

## 2024-01-09 DIAGNOSIS — Z79899 Other long term (current) drug therapy: Secondary | ICD-10-CM

## 2024-01-09 DIAGNOSIS — R0681 Apnea, not elsewhere classified: Secondary | ICD-10-CM | POA: Diagnosis not present

## 2024-01-09 DIAGNOSIS — R0683 Snoring: Secondary | ICD-10-CM

## 2024-01-09 DIAGNOSIS — G4719 Other hypersomnia: Secondary | ICD-10-CM | POA: Diagnosis not present

## 2024-01-09 DIAGNOSIS — Z9189 Other specified personal risk factors, not elsewhere classified: Secondary | ICD-10-CM

## 2024-01-09 DIAGNOSIS — M0579 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 without organ or systems involvement: Secondary | ICD-10-CM

## 2024-01-09 DIAGNOSIS — G4700 Insomnia, unspecified: Secondary | ICD-10-CM

## 2024-01-09 DIAGNOSIS — H209 Unspecified iridocyclitis: Secondary | ICD-10-CM

## 2024-01-09 MED ORDER — HUMIRA (2 PEN) 40 MG/0.4ML ~~LOC~~ AJKT: 40.0000 mg | AUTO-INJECTOR | SUBCUTANEOUS | 0 refills | Status: DC

## 2024-01-09 NOTE — Patient Instructions (Signed)

## 2024-01-09 NOTE — Telephone Encounter (Signed)
 Last Fill: 11/01/2023  Labs: 10/11/2023 Glucose is 127. Rest of CMP WNL  CBC stable.   Contacted patient to update labs.   TB Gold: 07/06/2023 TB Gold Negative   Next Visit: 02/09/2024  Last Visit: 10/18/2023  DX: 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 without organ or systems involvement   Current Dose per office note 10/18/2023: Humira  40 mg sq injections every 14 days   Okay to refill Humira ?

## 2024-01-09 NOTE — Progress Notes (Signed)
 Subjective:    Patient ID: Becky [name] is a 68 y.o. female.  HPI    True Mar, MD, PhD [facility] [address] [address] [place], [place] [address]  Dear Dr. Nichole,  I saw your patient, Becky [name], upon your kind request in my sleep clinic today for initial consultation of her sleep disorder, in particular, concern for underlying obstructive sleep apnea.  The patient is unaccompanied today.  As you know, [name] is a 68 year old female with an underlying complex medical history of osteoporosis, rheumatoid arthritis, Sjogren syndrome, allergies, anemia, anxiety, depression, eczema, reflux disease, irritable bowel syndrome, anemia, lumbar radiculopathy, hyperlipidemia, headaches, type 1 diabetes with retinopathy, chronic kidney disease, peripheral vascular disease, and neuropathy, who reports snoring and excessive daytime somnolence as well as witnessed apneas per husband's report.  Her Epworth sleepiness score is 13 out of 24, fatigue severity score is 59 out of 63.  I reviewed your office note from 11/02/2023.  She is familiar with sleep apnea diagnosis as her husband has a PAP machine.  She reports that her symptoms of feeling tired and fatigued and sleepy all the time have been ongoing for years.  She has never had a sleep study before.  She is not aware of any family history of sleep apnea.  She reports a family history of dementia in her father.  She lives with her husband, she has 1 grown daughter.  They have 1 dog in the household and the dog typically sleeps on the bed with them.  She does watch TV in her bedroom for about 20 minutes once she goes to bed.  She has chronic difficulty initiating and particularly maintaining sleep, has been taking melatonin about 10 mg each night.  She denies recurrent morning headaches or nocturnal headaches, does not have nightly nocturia.  She goes to bed between 10 and 11 and rise time is between 730 and 8.   She drinks caffeine in the form of coffee, 1 cup in the morning and 2 diet soda cans per day, typically not after lunch.  She drinks alcohol about 3-4 drinks per week.  She is a non-smoker.  Her Past Medical History Is Significant For: Past Medical History:  Diagnosis Date   Abnormal ECG    Allergy     Anemia    Anxiety    Cataract    . Cateracts bil eyes removed   Depression    Eczema    GERD (gastroesophageal reflux disease)    Heart murmur    History of COVID-19 06/14/2020   Hypercholesterolemia    IDDM (insulin  dependent diabetes mellitus) 1969   Migraine headache    Neuromuscular disorder (HCC)    bil legs & feet - neuropathy   Neuropathy    Osteoporosis    10/2012   Otosclerosis    Pigmented purpura (HCC)    RA (rheumatoid arthritis) (HCC) 2000   S/P cardiac cath 01/2012   Normal coronaries and normal LV function.   Sjoegren syndrome     Her Past Surgical History Is Significant For: Past Surgical History:  Procedure Laterality Date   back ablation  03/2020   back ablation  10/09/2020   BREAST LUMPECTOMY  1995   CARPAL TUNNEL RELEASE  2000   CATARACT EXTRACTION Right 09-05-12   CATARACT EXTRACTION Left 3/15   CESAREAN SECTION  1987   COLONOSCOPY     EYE SURGERY     diabetic retinopathy    HYSTEROSCOPY  02/2001  and D & C   LYMPH NODE DISSECTION  2002   TOE SURGERY  2000   bone removed    Her Family History Is Significant For: Family History  Problem Relation Age of Onset   Breast cancer Mother 58   Colon polyps Mother        unspecified number   Uterine cancer Mother        metastatic   Endometrial cancer Mother 20   Stroke Father 75   Dementia Father    Rheum arthritis Sister    Breast cancer Sister 48       lump; (-)GT (breast/ova ca panel, GeneDx)   Breast cancer Maternal Grandmother 35   Skin cancer Maternal Grandfather        unspecified type   Prostate cancer Maternal Grandfather        dx. late 40s   Breast cancer Cousin         bilateral; dx. late 51s   Breast cancer Other        dx. in their late 60s   Breast cancer Other        dx. 78-79   Colon cancer Neg Hx    Esophageal cancer Neg Hx    Rectal cancer Neg Hx    Stomach cancer Neg Hx     Her Social History Is Significant For: Social History   Socioeconomic History   Marital status: Married    Spouse name: Not on file   Number of children: 1   Years of education: Not on file   Highest education level: Not on file  Occupational History   Occupation: disability    Employer: UNEMPLOYED  Tobacco Use   Smoking status: Never    Passive exposure: Past   Smokeless tobacco: Never  Vaping Use   Vaping status: Never Used  Substance and Sexual Activity   Alcohol use: Yes    Alcohol/week: 2.0 standard drinks of alcohol    Types: 1 Shots of liquor, 1 Standard drinks or equivalent per week    Comment: maybe 1-2 cocktails per wk   Drug use: Not Currently    Types: Marijuana   Sexual activity: Yes    Partners: Male    Birth control/protection: Post-menopausal    Comment: vasectomy/first intercourse <17  Other Topics Concern   Not on file  Social History Narrative   Caffiene 1 coffee daily, 1-2 diet cokes daily.   Live husband, 1 dog (sleeps in bed)   Work retired.     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 1 kit - 2 pens   aspirin  81 MG tablet Take 81 mg by mouth every other day.   Bacillus Coagulans-Inulin (  ALIGN PREBIOTIC-PROBIOTIC) 5-1.25 MG-GM CHEW Chew by mouth.   bisacodyl 5 MG EC tablet Take 5 mg by mouth daily as needed for moderate constipation.   calcium carbonate (OS-CAL) 600 MG TABS Take 600 mg by mouth 2 (two) times daily with a meal.    Continuous Blood Gluc Sensor (FREESTYLE LIBRE 2 SENSOR) MISC CHANGE EVERY 14 DAYS   CONTOUR NEXT TEST test strip    cyclobenzaprine  (FLEXERIL ) 10 MG tablet Take 10 mg by mouth 3 (three) times daily as needed for muscle spasms. Reported on 07/03/2015   DULoxetine  (CYMBALTA ) 30 MG capsule Take 90 mg by mouth daily.   famotidine (PEPCID) 20 MG tablet Take 20 mg by mouth 2 (two) times daily.   furosemide (LASIX) 20 MG tablet Take 10 mg by mouth daily.    hydroxychloroquine  (PLAQUENIL ) 200 MG tablet TAKE ONE TABLET BY MOUTH EVERY DAY   insulin  aspart (NOVOLOG ) 100 UNIT/ML injection Insulin  pump   linaclotide  (LINZESS ) 290 MCG CAPS capsule Take 1 capsule (290 mcg total) by mouth daily before breakfast.   losartan  (COZAAR ) 100 MG tablet    polycarbophil (FIBERCON) 625 MG tablet Take 625 mg by mouth daily.   polyethylene glycol (MIRALAX / GLYCOLAX) 17 g packet Take 17 g by mouth 2 (two) times daily.    pravastatin  (PRAVACHOL ) 20 MG tablet Take 20 mg by mouth daily.    SUMAtriptan (IMITREX) 100 MG tablet Take 100 mg by mouth every 2 (two) hours as needed.   topiramate  (TOPAMAX ) 200 MG tablet Take 200 mg by mouth daily.    vitamin B-12 (CYANOCOBALAMIN ) 1000 MCG tablet Take 1,000 mcg by mouth daily.   predniSONE  (DELTASONE ) 5 MG tablet Take 1 tablet  (5 mg) daily x 1 week then reduce to 1/2 tablet (2.5 mg) daily  x 1 week. Take prednisone  in the morning with food and avoid the use of NSAIDs.   sulfamethoxazole -trimethoprim  (BACTRIM  DS) 800-160 MG tablet Take 1 tablet by  mouth 2 (two) times daily. (Patient not taking: Reported on 01/09/2024)   No facility-administered encounter medications on file as of 01/09/2024.  :   Review of Systems:  Out of a complete 14 point review of systems, all are reviewed and negative with the exception of these symptoms as listed below:  Review of Systems  Neurological:        Snores, fatigue, witnessed apnea. Some morning headaches. Not rested.  ESS FSS     Objective:  Neurological Exam  Physical Exam Physical Examination:   Vitals:   01/09/24 1349  BP: 112/69  Pulse: 85    General Examination: The patient is a very pleasant 68 y.o. female in no acute distress. She appears well-developed and well-nourished and well groomed.   HEENT: Normocephalic, atraumatic, pupils are equal, round and reactive to light, extraocular tracking is good without limitation to gaze excursion or nystagmus noted. Hearing is grossly intact. Face is symmetric with normal facial animation. Speech is clear with no dysarthria noted. There is no hypophonia. There is no lip, neck/head, jaw or voice tremor. Neck is supple with full range of passive and active motion. There are no carotid bruits on auscultation. Oropharynx exam reveals: mild mouth dryness, good dental hygiene and mild airway crowding, due to small airway entry, tonsils small, Mallampati class II, neck circumference 14 inches.  Tongue protrudes centrally and palate elevates symmetrically. She has a mild to moderate overbite.  Chest: Clear to auscultation without wheezing, rhonchi or crackles noted.  Heart: S1+S2+0, regular and normal without  murmurs, rubs or gallops noted.   Abdomen: Soft, non-tender and non-distended.  Extremities: There is no pitting edema in the distal lower extremities bilaterally.   Skin: Warm and dry with chronic appearing discoloration and redness in the distal lower extremities bilaterally.     Musculoskeletal: exam reveals no obvious joint deformities.    Neurologically:  Mental status: The patient is awake, alert and oriented in all 4 spheres. Her immediate and remote memory, attention, language skills and fund of knowledge are appropriate. There is no evidence of aphasia, agnosia, apraxia or anomia. Speech is clear with normal prosody and enunciation. Thought process is linear. Mood is normal and affect is normal.  Cranial nerves II - XII are as described above under HEENT exam.  Motor exam: Normal bulk, strength and tone is noted. There is no obvious action or resting tremor.  Fine motor skills and coordination: grossly intact.  Cerebellar testing: No dysmetria or intention tremor. There is no truncal or gait ataxia.  Sensory exam: intact to light touch in the upper and lower extremities.  Gait, station and balance: She stands easily. No veering to one side is noted. No leaning to one side is noted. Posture is age-appropriate and stance is narrow based. Gait shows normal stride length and normal pace. No problems turning are noted.   Assessment and Plan:   In summary, Aishia T Moilanen is a very pleasant 68 y.o.-year old female with an underlying complex medical history of osteoporosis, rheumatoid arthritis, Sjogren syndrome, allergies, anemia, anxiety, depression, eczema, reflux disease, irritable bowel syndrome, anemia, lumbar radiculopathy, hyperlipidemia, headaches, type 1 diabetes with retinopathy, chronic kidney disease, peripheral vascular disease, and neuropathy, whose history and physical exam are concerning for sleep disordered breathing, particularly obstructive sleep apnea (OSA). A laboratory attended sleep study is typically considered gold standard for evaluation of sleep disordered breathing.   I had a long chat with the patient about my findings and the diagnosis of sleep apnea, particularly OSA, its prognosis and treatment options. We talked about medical/conservative treatments, surgical interventions and non-pharmacological  approaches for symptom control. I explained, in particular, the risks and ramifications of untreated moderate to severe OSA, especially with respect to developing cardiovascular disease down the road, including congestive heart failure (CHF), difficult to treat hypertension, cardiac arrhythmias (particularly A-fib), neurovascular complications including TIA, stroke and dementia. Even type 2 diabetes has, in part, been linked to untreated OSA. Symptoms of untreated OSA may include (but may not be limited to) daytime sleepiness, nocturia (i.e. frequent nighttime urination), memory problems, mood irritability and suboptimally controlled or worsening mood disorder such as depression and/or anxiety, lack of energy, lack of motivation, physical discomfort, as well as recurrent headaches, especially morning or nocturnal headaches. We talked about the importance of maintaining a healthy lifestyle and striving for healthy weight. In addition, we talked about the importance of striving for and maintaining good sleep hygiene. I recommended a sleep study at this time. I outlined the differences between a laboratory attended sleep study which is considered more comprehensive and accurate over the option of a home sleep test (HST); the latter may lead to underestimation of sleep disordered breathing in some instances and does not help with diagnosing upper airway resistance syndrome and is not accurate enough to diagnose primary central sleep apnea typically. I outlined possible surgical and non-surgical treatment options of OSA, including the use of a positive airway pressure (PAP) device (i.e. CPAP, AutoPAP/APAP or BiPAP in certain circumstances), a custom-made dental device (aka oral appliance,  which would require a referral to a specialist dentist or orthodontist typically, and is generally speaking not considered for patients with full dentures or edentulous state), upper airway surgical options, such as traditional UPPP  (which is not considered a first-line treatment) or the Inspire device (hypoglossal nerve stimulator, which would involve a referral for consultation with an ENT surgeon, after careful selection, following inclusion criteria - also not first-line treatment). I explained the PAP treatment option to the patient in detail, as this is generally considered first-line treatment.  The patient indicated that she would be willing to try PAP therapy, if the need arises. I explained the importance of being compliant with PAP treatment, not only for insurance purposes but primarily to improve patient's symptoms symptoms, and for the patient's long term health benefit, including to reduce Her cardiovascular risks longer-term.    We will pick up our discussion about the next steps and treatment options after testing.  We will keep her posted as to the test results by phone call and/or MyChart messaging where possible.  We will plan to follow-up in sleep clinic accordingly as well.  I answered all her questions today and the patient was in agreement.   I encouraged her to call with any interim questions, concerns, problems or updates or email us  through MyChart.  Generally speaking, sleep test authorizations may take up to 2 weeks, sometimes less, sometimes longer, the patient is encouraged to get in touch with us  if they do not hear back from the sleep lab staff directly within the next 2 weeks.  Thank you very much for allowing me to participate in the care of this nice patient. If I can be of any further assistance to you please do not hesitate to call me at [phone].  Sincerely,   True Mar, MD, PhD

## 2024-01-17 DIAGNOSIS — J329 Chronic sinusitis, unspecified: Secondary | ICD-10-CM | POA: Diagnosis not present

## 2024-01-17 DIAGNOSIS — H938X1 Other specified disorders of right ear: Secondary | ICD-10-CM | POA: Diagnosis not present

## 2024-01-17 DIAGNOSIS — R0981 Nasal congestion: Secondary | ICD-10-CM | POA: Diagnosis not present

## 2024-01-18 ENCOUNTER — Ambulatory Visit: Payer: Medicare Other | Admitting: Rheumatology

## 2024-01-18 ENCOUNTER — Telehealth: Payer: Self-pay | Admitting: *Deleted

## 2024-01-18 NOTE — Telephone Encounter (Signed)
 Yes, if she has planned surgery to her nose, we can certainly wait till after she has recovered from her surgery, I would recommend scheduling her sleep study about 4 to 6 weeks out from her nasal surgery.

## 2024-01-18 NOTE — Telephone Encounter (Signed)
 I called pt and relayed that we did consult with Dr. Buck and she said yes would b better to wait until after her surgery.  4-6 wks.  She has been treated with 2 ABX and steroids and no improvement.  Has appt with ENT surgeon 01-31-2024.  I told her to let him know trying to get sleep study.  I told her will send to [facility] in sleep lab to let her know.  Pt will keep us  abreast of when surgery scheduled.  She appreciated call back.

## 2024-01-20 DIAGNOSIS — L111 Transient acantholytic dermatosis [Grover]: Secondary | ICD-10-CM

## 2024-01-20 HISTORY — DX: Transient acantholytic dermatosis (grover): L11.1

## 2024-01-23 ENCOUNTER — Encounter: Admitting: Obstetrics and Gynecology

## 2024-01-23 NOTE — Telephone Encounter (Signed)
 I spoke with the patient she stated she will give me a call once she gets her surgery date and we then will schedule it out 4-6 weeks.

## 2024-01-25 ENCOUNTER — Other Ambulatory Visit: Payer: Self-pay

## 2024-01-25 DIAGNOSIS — M0579 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 without organ or systems involvement: Secondary | ICD-10-CM | POA: Diagnosis not present

## 2024-01-25 DIAGNOSIS — L2089 Other atopic dermatitis: Secondary | ICD-10-CM | POA: Diagnosis not present

## 2024-01-25 DIAGNOSIS — M3501 Sicca syndrome with keratoconjunctivitis: Secondary | ICD-10-CM | POA: Diagnosis not present

## 2024-01-25 DIAGNOSIS — L111 Transient acantholytic dermatosis [Grover]: Secondary | ICD-10-CM | POA: Diagnosis not present

## 2024-01-25 DIAGNOSIS — D485 Neoplasm of uncertain behavior of skin: Secondary | ICD-10-CM | POA: Diagnosis not present

## 2024-01-26 ENCOUNTER — Ambulatory Visit: Payer: Self-pay | Admitting: Physician Assistant

## 2024-01-26 NOTE — Progress Notes (Signed)
Complements WNL

## 2024-01-26 NOTE — Progress Notes (Signed)
 Glucose is elevated-215. Rest of CMP WNL. CBC stable.  ESR WNL

## 2024-01-26 NOTE — Progress Notes (Signed)
 Office Visit Note  [name]             Date of Birth: [birth year]           MRN: [id number]             PCP: Nichole Senior, MD Referring: Nichole Senior, MD Visit Date: 02/09/2024 Occupation: @GUAROCC @  Subjective:  Medication monitoring   History of Present Illness: Becky [name] is a 68 y.o. female with history of rheumatoid arthritis and Sjogren's syndrome.  Patient is prescribed humira  40 mg sq injections every 14 days.  Patient reports that she has been on several courses of prednisone  since her last office visit.  Patient states that 1 course of prednisone  was prescribed for a flare of rheumatoid arthritis.  Patient states that she then developed sinusitis requiring her to stop Humira .  Patient states states that she was off of Humira  for about 1 month while recovering from sinusitis.  She was also treated with a course of steroids at that time. She is scheduled for sinus surgery on Monday.  Her most recent dose of humira  was administered on 01/30/24.  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  She has been off of prednisone  for the past week and is not currently any signs or symptoms of a flare.  Patient has continued to find viscosupplement injections effective at managing bilateral knee pain.  Patient plans on proceeding with viscosupplementation again in October 2025 if approved by insurance.  She denies any joint swelling at this time.     Activities of Daily Living:  Patient reports morning stiffness for 0 minutes.   Patient Denies nocturnal pain.  Difficulty dressing/grooming: Denies Difficulty climbing stairs: Reports Difficulty getting out of chair: Denies Difficulty using hands for taps, buttons, cutlery, and/or writing: Denies  Review of Systems  Constitutional:  Positive for fatigue.  HENT:  Positive for mouth dryness. Negative for mouth sores.    Eyes:  Positive for dryness.  Respiratory:  Negative for shortness of breath.   Cardiovascular:  Negative for chest pain and palpitations.  Gastrointestinal:  Positive for constipation. Negative for blood in stool and diarrhea.  Endocrine: Negative for increased urination.  Genitourinary:  Negative for involuntary urination.  Musculoskeletal:  Positive for gait problem. Negative for joint pain, joint pain, joint swelling, myalgias, muscle weakness, morning stiffness, muscle tenderness and myalgias.  Skin:  Positive for rash. Negative for color change, hair loss and sensitivity to sunlight.  Allergic/Immunologic: Positive for susceptible to infections.  Neurological:  Positive for dizziness. Negative for headaches.  Hematological:  Negative for swollen glands.  Psychiatric/Behavioral:  Positive for depressed mood and sleep disturbance. The patient is nervous/anxious.     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 determined by examination associated with type 1 diabetes mellitus (HCC) 03/18/2020   Stable treated proliferative diabetic retinopathy of right eye determined by examination associated with type 2 diabetes mellitus (HCC) 03/18/2020   History of vitrectomy 03/18/2020   Complicated UTI (urinary tract infection)  12/13/2018   Failure of outpatient treatment 12/11/2018   Acute metabolic encephalopathy 12/11/2018   Fever 12/10/2018   At high risk for breast cancer 05/09/2018   Eosinophilia 01/31/2017   Iritis 01/04/2017   History of joint swelling 08/24/2016    Osteoarthritis of knees, bilateral 08/24/2016   History of migraine 08/24/2016   History of depression 08/24/2016   History of osteoporosis 08/24/2016   DDD (degenerative disc disease), lumbar 08/24/2016   High risk medication use 08/24/2016   Trigger finger, left middle finger 08/24/2016   Trigger finger, right ring finger 08/24/2016   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   Increased risk of breast cancer 07/06/2015   IDDM (insulin  dependent diabetes mellitus) 07/06/2015   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 without organ or systems involvement (HCC) 07/06/2015   Sjogren's syndrome (HCC) 07/06/2015   Genetic testing 05/12/2015   Family history of breast cancer 04/15/2015   Fibrocystic breast disease 10/11/2012   GERD 10/01/2009   Chest pain with moderate risk for cardiac etiology 09/02/2009   DYSPHAGIA 09/02/2009   Anxiety state 08/15/2009   Gastric ulcer 08/15/2009   Constipation 08/15/2009   SLOW TRANSIT CONSTIPATION 08/15/2009   FLATULENCE ERUCTATION AND GAS PAIN 08/15/2009    Past Medical History:  Diagnosis Date   Abnormal ECG    Allergy     Anemia    Anxiety    Cataract    . Cateracts bil eyes removed   Depression    Eczema    GERD (gastroesophageal reflux disease)    Grover's disease 01/2024   Heart murmur    History of COVID-19 06/14/2020   Hypercholesterolemia    Hypertension    IDDM (insulin  dependent diabetes mellitus) 1969   Migraine headache    Neuromuscular disorder (HCC)    bil legs & feet - neuropathy   Neuropathy    Osteoporosis    10/2012   Otosclerosis    Pigmented purpura (HCC)    RA (rheumatoid arthritis) (HCC) 2000   S/P cardiac cath 01/2012   Normal coronaries and normal LV function.   Sjoegren syndrome     Family History  Problem Relation Age of Onset   Breast cancer Mother 38   Colon polyps Mother        unspecified number   Uterine cancer Mother        metastatic   Endometrial cancer Mother 28   Stroke  Father 20   Dementia Father    Rheum arthritis Sister    Breast cancer Sister 48       lump; (-)GT (breast/ova ca panel, GeneDx)   Breast cancer Maternal Grandmother 89   Skin cancer Maternal Grandfather        unspecified type   Prostate cancer Maternal Grandfather        dx. late 32s   Breast cancer Cousin        bilateral; dx. late 47s   Breast cancer Other        dx. in their late 60s   Breast cancer Other        dx. 78-79   Colon cancer Neg Hx    Esophageal cancer Neg Hx    Rectal cancer Neg Hx    Stomach cancer Neg Hx    Past Surgical History:  Procedure Laterality Date   back ablation  03/2020   back ablation  10/09/2020   BREAST LUMPECTOMY  1995   CARPAL TUNNEL RELEASE  2000   CATARACT EXTRACTION Right 09-05-12   CATARACT  EXTRACTION Left 3/15   CESAREAN SECTION  1987   COLONOSCOPY     EYE SURGERY     diabetic retinopathy    HYSTEROSCOPY  02/2001   and D & C   LYMPH NODE DISSECTION  2002   TOE SURGERY  2000   bone removed   Social History   Social History Narrative   Caffiene 1 coffee daily, 1-2 diet cokes daily.   Live husband, 1 dog (sleeps in bed)   Work retired.     Immunization History  Administered Date(s) Administered   PFIZER(Purple Top)SARS-COV-2 Vaccination 07/11/2019, 08/01/2019, 02/05/2020, 09/15/2020   Tdap 06/21/2013     Objective: Vital Signs: BP 126/74 (BP Location: Right Arm, Patient Position: Sitting, Cuff Size: Normal)   Pulse 90   Resp 13   Ht 5' 4 (1.626 m)   Wt 123 lb 6.4 oz (56 kg)   LMP 09/19/2008   BMI 21.18 kg/m    Physical Exam Vitals and nursing note reviewed.  Constitutional:      Appearance: She is well-developed.  HENT:     Head: Normocephalic and atraumatic.  Eyes:     Conjunctiva/sclera: Conjunctivae normal.  Cardiovascular:     Rate and Rhythm: Normal rate and regular rhythm.     Heart sounds: Normal heart sounds.  Pulmonary:     Effort: Pulmonary effort is normal.     Breath sounds: Normal breath  sounds.  Abdominal:     General: Bowel sounds are normal.     Palpations: Abdomen is soft.  Musculoskeletal:     Cervical back: Normal range of motion.  Lymphadenopathy:     Cervical: No cervical adenopathy.  Skin:    General: Skin is warm and dry.     Capillary Refill: Capillary refill takes less than 2 seconds.  Neurological:     Mental Status: She is alert and oriented to person, place, and time.  Psychiatric:        Behavior: Behavior normal.      Musculoskeletal Exam: C-spine, thoracic spine, lumbar spine good range of motion.  Shoulder joints, elbow joints, wrist joints, MCPs, PIPs, DIPs have good range of motion with no synovitis.  Complete fist formation bilaterally.  Hip joints have good range of motion with no groin pain.  Knee joints have good range of motion no warmth or effusion.  Ankle joints have good range of motion with no tenderness or joint swelling.  CDAI Exam: CDAI Score: -- Patient Global: --; Provider Global: -- Swollen: --; Tender: -- Joint Exam 02/09/2024   No joint exam has been documented for this visit   There is currently no information documented on the homunculus. Go to the Rheumatology activity and complete the homunculus joint exam.  Investigation: No additional findings.  Imaging: No results found.  Recent Labs: Lab Results  Component Value Date   WBC 8.2 01/25/2024   HGB 14.4 01/25/2024   PLT 316 01/25/2024   NA 140 01/25/2024   K 4.0 01/25/2024   CL 105 01/25/2024   CO2 25 01/25/2024   GLUCOSE 215 (H) 01/25/2024   BUN 12 01/25/2024   CREATININE 1.01 01/25/2024   BILITOT 0.4 01/25/2024   ALKPHOS 79 05/13/2020   AST 13 01/25/2024   ALT 7 01/25/2024   PROT 6.7 01/25/2024   PROT 7.0 01/25/2024   ALBUMIN 3.9 05/13/2020   CALCIUM 9.7 01/25/2024   GFRAA 66 12/17/2020   QFTBGOLD NEGATIVE 04/06/2017   QFTBGOLDPLUS NEGATIVE 07/06/2023    Speciality Comments: PLQ Eye  Exam: 10/13/2023 WNL @ [facility] Follow up in 6  months.  Receives Enbrel  Sure[employer] through [employer]. Prolia  start 01/12/21   Procedures:  No procedures performed Allergies: Chlorhexidine, Doxycycline, Ciprofloxacin, Denosumab , Macrobid [nitrofurantoin macrocrystal], Molnupiravir, and Penicillins    Assessment / Plan:     Visit Diagnoses: 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 without organ or systems involvement (HCC): She has no synovitis on examination today. Since her last office visit she has been on 3 courses of steroids which she feels have been helping to alleviate her symptoms from rheumatoid arthritis.  The initial taper was for a rheumatoid arthritis flare, next taper was given while patient had sinusitis, and the third taper was provided due to new diagnosis of Grovers disease.  She has been off off prednisone  for the past 1 week and has not had any recurrence of joint pain or inflammation.  Patient was off of Humira  for about 1 month while recovering from sinusitis but her most recent dose of Humira  was administered on 01/30/2024. She is scheduled for sinus surgery on Monday and was advised to hold her dose of Humira  this coming Monday.  She will require clearance by her surgeon prior to resuming humira -she voiced understanding. She was advised to notify us  if she develops any signs or symptoms of a flare.  She will follow-up in the office in 5 months or sooner if needed.  Sjogren's syndrome with keratoconjunctivitis sicca (HCC): She continues to have chronic sicca symptoms.  She continues to follow-up with ophthalmology on a regular basis and has been seeing the dentist at least every 6 months.  She has not developed any new or worsening symptoms.  Discussed increased risk for developing lymphoma in patients with Sjogren syndrome.  Lab work from 01/25/2024 was reviewed today in the office: ANA remains +1: 40NS, SPEP normal, complements within normal limits, ESR within normal limits.  She will  notify us  if she develops any new or worsening symptoms.  High risk medication use - Humira  40 mg sq injections every 14 days.  Humira  initiated on 08/22/2023 Inadequate response and loss of patient assistance for Enbrel . CBC and CMP updated on 01/25/24.  Her next lab work will be due in November and every 3 months to monitor for GI toxicity. TB gold negative on  07/06/23.  Patient will be undergoing sinus surgery on Monday due to recurrent sinusitis.  Discussed the importance of holding humira  if she develops signs or symptoms of an infection and to resume once the infection has completely cleared.  Iritis - No active inflammation noted on exam from 05/04/2023.  No medication changes will be made at this time.    Trigger thumb, left thumb: Resolved.   Primary osteoarthritis of both knees: Underwent viscosupplementation in April 2025.  Patient would like to proceed with viscosupplementation again in October 2025 when she is eligible.  She has had good response to viscosupplementation in the past.  On examination today she has good range of motion of both knees with no warmth or effusion.  DDD (degenerative disc disease), cervical - Referred to PT-completed on 03/16/23.  No symptoms of radiculopathy.    Degeneration of intervertebral disc of lumbar region without discogenic back pain or lower extremity pain: No symptoms of radiculopathy.  No midline spinal tenderness.    Age-related osteoporosis without current pathological fracture - DEXA ordered by Dr. Nichole.  Prolia  injections x3. last injection 07/27/21.  Discontinued Prolia -SE.  She is taking calcium  carbonate 600 mg twice daily.  Other medical conditions are listed as follows:   Grover's disease: New diagnosis-recently treated with a course of prednisone .  History of migraine  Other fatigue  History of gastroesophageal reflux (GERD)  History of diabetes mellitus: Type I-under care of endocrinology.  History of depression  Orders: No  orders of the defined types were placed in this encounter.  No orders of the defined types were placed in this encounter.  Follow-Up Instructions: Return in about 5 months (around 07/11/2024) for Rheumatoid arthritis, Sjogren's syndrome.   Waddell CHRISTELLA Craze, PA-C  Note - This record has been created using Dragon software.  Chart creation errors have been sought, but may not always  have been located. Such creation errors do not reflect on  the standard of medical care.

## 2024-01-28 LAB — CBC WITH DIFFERENTIAL/PLATELET
Absolute Lymphocytes: 1788 {cells}/uL (ref 850–3900)
Absolute Monocytes: 508 {cells}/uL (ref 200–950)
Basophils Absolute: 123 {cells}/uL (ref 0–200)
Basophils Relative: 1.5 %
Eosinophils Absolute: 1271 {cells}/uL — ABNORMAL HIGH (ref 15–500)
Eosinophils Relative: 15.5 %
HCT: 45.7 % — ABNORMAL HIGH (ref 35.0–45.0)
Hemoglobin: 14.4 g/dL (ref 11.7–15.5)
MCH: 29.4 pg (ref 27.0–33.0)
MCHC: 31.5 g/dL — ABNORMAL LOW (ref 32.0–36.0)
MCV: 93.3 fL (ref 80.0–100.0)
MPV: 13.2 fL — ABNORMAL HIGH (ref 7.5–12.5)
Monocytes Relative: 6.2 %
Neutro Abs: 4510 {cells}/uL (ref 1500–7800)
Neutrophils Relative %: 55 %
Platelets: 316 Thousand/uL (ref 140–400)
RBC: 4.9 Million/uL (ref 3.80–5.10)
RDW: 13.7 % (ref 11.0–15.0)
Total Lymphocyte: 21.8 %
WBC: 8.2 Thousand/uL (ref 3.8–10.8)

## 2024-01-28 LAB — PROTEIN ELECTROPHORESIS, SERUM, WITH REFLEX
Albumin ELP: 4 g/dL (ref 3.8–4.8)
Alpha 1: 0.3 g/dL (ref 0.2–0.3)
Alpha 2: 0.9 g/dL (ref 0.5–0.9)
Beta 2: 0.5 g/dL (ref 0.2–0.5)
Beta Globulin: 0.4 g/dL (ref 0.4–0.6)
Gamma Globulin: 1 g/dL (ref 0.8–1.7)
Total Protein: 7 g/dL (ref 6.1–8.1)

## 2024-01-28 LAB — ANTI-NUCLEAR AB-TITER (ANA TITER): ANA Titer 1: 1:40 {titer} — ABNORMAL HIGH

## 2024-01-28 LAB — COMPREHENSIVE METABOLIC PANEL WITH GFR
AG Ratio: 1.5 (calc) (ref 1.0–2.5)
ALT: 7 U/L (ref 6–29)
AST: 13 U/L (ref 10–35)
Albumin: 4 g/dL (ref 3.6–5.1)
Alkaline phosphatase (APISO): 67 U/L (ref 37–153)
BUN: 12 mg/dL (ref 7–25)
CO2: 25 mmol/L (ref 20–32)
Calcium: 9.7 mg/dL (ref 8.6–10.4)
Chloride: 105 mmol/L (ref 98–110)
Creat: 1.01 mg/dL (ref 0.50–1.05)
Globulin: 2.7 g/dL (ref 1.9–3.7)
Glucose, Bld: 215 mg/dL — ABNORMAL HIGH (ref 65–99)
Potassium: 4 mmol/L (ref 3.5–5.3)
Sodium: 140 mmol/L (ref 135–146)
Total Bilirubin: 0.4 mg/dL (ref 0.2–1.2)
Total Protein: 6.7 g/dL (ref 6.1–8.1)
eGFR: 61 mL/min/1.73m2 (ref >=60)

## 2024-01-28 LAB — SEDIMENTATION RATE: Sed Rate: 11 mm/h (ref 0–30)

## 2024-01-28 LAB — C3 AND C4
C3 Complement: 103 mg/dL (ref 83–193)
C4 Complement: 23 mg/dL (ref 15–57)

## 2024-01-28 LAB — ANA: Anti Nuclear Antibody (ANA): POSITIVE — AB

## 2024-01-29 NOTE — Progress Notes (Signed)
 SPEP-no abnormal protein bands-good news!  ANA is positive-low titer

## 2024-01-30 ENCOUNTER — Ambulatory Visit: Admitting: Physician Assistant

## 2024-01-31 DIAGNOSIS — J324 Chronic pansinusitis: Secondary | ICD-10-CM | POA: Diagnosis not present

## 2024-02-06 ENCOUNTER — Encounter (HOSPITAL_BASED_OUTPATIENT_CLINIC_OR_DEPARTMENT_OTHER): Payer: Self-pay | Admitting: Otolaryngology

## 2024-02-06 DIAGNOSIS — H43813 Vitreous degeneration, bilateral: Secondary | ICD-10-CM | POA: Diagnosis not present

## 2024-02-06 DIAGNOSIS — H35372 Puckering of macula, left eye: Secondary | ICD-10-CM | POA: Diagnosis not present

## 2024-02-06 DIAGNOSIS — H04123 Dry eye syndrome of bilateral lacrimal glands: Secondary | ICD-10-CM | POA: Diagnosis not present

## 2024-02-06 DIAGNOSIS — E103512 Type 1 diabetes mellitus with proliferative diabetic retinopathy with macular edema, left eye: Secondary | ICD-10-CM | POA: Diagnosis not present

## 2024-02-06 DIAGNOSIS — E103591 Type 1 diabetes mellitus with proliferative diabetic retinopathy without macular edema, right eye: Secondary | ICD-10-CM | POA: Diagnosis not present

## 2024-02-08 DIAGNOSIS — L821 Other seborrheic keratosis: Secondary | ICD-10-CM | POA: Diagnosis not present

## 2024-02-08 DIAGNOSIS — L578 Other skin changes due to chronic exposure to nonionizing radiation: Secondary | ICD-10-CM | POA: Diagnosis not present

## 2024-02-08 DIAGNOSIS — W908XXD Exposure to other nonionizing radiation, subsequent encounter: Secondary | ICD-10-CM | POA: Diagnosis not present

## 2024-02-08 DIAGNOSIS — L111 Transient acantholytic dermatosis [Grover]: Secondary | ICD-10-CM | POA: Diagnosis not present

## 2024-02-08 DIAGNOSIS — L570 Actinic keratosis: Secondary | ICD-10-CM | POA: Diagnosis not present

## 2024-02-09 ENCOUNTER — Encounter (INDEPENDENT_AMBULATORY_CARE_PROVIDER_SITE_OTHER): Admitting: Physician Assistant

## 2024-02-09 ENCOUNTER — Encounter: Payer: Self-pay | Admitting: Physician Assistant

## 2024-02-09 ENCOUNTER — Encounter (HOSPITAL_BASED_OUTPATIENT_CLINIC_OR_DEPARTMENT_OTHER)
Admission: RE | Admit: 2024-02-09 | Discharge: 2024-02-09 | Disposition: A | Discharge status: Home / Self Care | Source: Ambulatory Visit | Attending: Otolaryngology | Admitting: Otolaryngology

## 2024-02-09 VITALS — BP 126/74 | HR 90 | Resp 13 | Ht 64.0 in | Wt 123.4 lb

## 2024-02-09 DIAGNOSIS — M170 Bilateral primary osteoarthritis of knee: Secondary | ICD-10-CM | POA: Insufficient documentation

## 2024-02-09 DIAGNOSIS — Z79620 Long term (current) use of immunosuppressive biologic: Secondary | ICD-10-CM | POA: Insufficient documentation

## 2024-02-09 DIAGNOSIS — R5383 Other fatigue: Secondary | ICD-10-CM

## 2024-02-09 DIAGNOSIS — M3500 Sicca syndrome, unspecified: Secondary | ICD-10-CM | POA: Diagnosis not present

## 2024-02-09 DIAGNOSIS — Z7952 Long term (current) use of systemic steroids: Secondary | ICD-10-CM | POA: Diagnosis not present

## 2024-02-09 DIAGNOSIS — H209 Unspecified iridocyclitis: Secondary | ICD-10-CM

## 2024-02-09 DIAGNOSIS — K219 Gastro-esophageal reflux disease without esophagitis: Secondary | ICD-10-CM | POA: Insufficient documentation

## 2024-02-09 DIAGNOSIS — M0579 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 without organ or systems involvement: Secondary | ICD-10-CM | POA: Insufficient documentation

## 2024-02-09 DIAGNOSIS — M65312 Trigger thumb, left thumb: Secondary | ICD-10-CM

## 2024-02-09 DIAGNOSIS — M069 Rheumatoid arthritis, unspecified: Secondary | ICD-10-CM | POA: Diagnosis not present

## 2024-02-09 DIAGNOSIS — M51369 Other intervertebral disc degeneration, lumbar region without mention of lumbar back pain or lower extremity pain: Secondary | ICD-10-CM | POA: Insufficient documentation

## 2024-02-09 DIAGNOSIS — E109 Type 1 diabetes mellitus without complications: Secondary | ICD-10-CM | POA: Insufficient documentation

## 2024-02-09 DIAGNOSIS — M5030 Other cervical disc degeneration, unspecified cervical region: Secondary | ICD-10-CM

## 2024-02-09 DIAGNOSIS — L111 Transient acantholytic dermatosis [Grover]: Secondary | ICD-10-CM

## 2024-02-09 DIAGNOSIS — M3501 Sicca syndrome with keratoconjunctivitis: Secondary | ICD-10-CM | POA: Diagnosis not present

## 2024-02-09 DIAGNOSIS — M810 Age-related osteoporosis without current pathological fracture: Secondary | ICD-10-CM

## 2024-02-09 DIAGNOSIS — Z8719 Personal history of other diseases of the digestive system: Secondary | ICD-10-CM

## 2024-02-09 DIAGNOSIS — Z79899 Other long term (current) drug therapy: Secondary | ICD-10-CM

## 2024-02-09 DIAGNOSIS — Z8639 Personal history of other endocrine, nutritional and metabolic disease: Secondary | ICD-10-CM

## 2024-02-09 DIAGNOSIS — Z8659 Personal history of other mental and behavioral disorders: Secondary | ICD-10-CM

## 2024-02-09 DIAGNOSIS — Z8669 Personal history of other diseases of the nervous system and sense organs: Secondary | ICD-10-CM

## 2024-02-10 NOTE — H&P (Signed)
 HPI:   Advice Only (Sinus Infection; Nose stopped up' Ear hurts/Throat drainage;coughing)  History of Present Illness The patient is a 68 year old lady here today for a variety of issues, including a sinus infection, nasal complaints, cough, and sore throat. Patient reports that she has had symptoms of about 1 month duration that were originally treated with doxycycline due to her having a penicillin allergy  (hives however the doxycycline caused her vomiting as a side effect and she could not continue course of that doctor put her on SMZ-TMP which she tolerated however did not improve her symptoms. She reports right sided maxillary sinus pressure and pain and 1 month duration and decreased ability to breathe from the right nostril. She also has allergy  to Macrobid. She is unsure if she has taken cephalosporins safely in the past. Patient thinks that remotely she was seen by Dr. Mable but is unsure if she was treated with antibiotics for sinusitis, and what his antibiotics might be. She also has a history of stapedectomy right ear with Dr. Jesus.  PMH/Meds/All/SocHx/FamHx/ROS:   Medical History[1]  Surgical History[2]  No family history of bleeding disorders, wound healing problems or difficulty with anesthesia.   Social History   Socioeconomic History  Marital status: Married  Spouse name: Not on file  Number of children: Not on file  Years of education: Not on file  Highest education level: Not on file  Occupational History  Not on file  Tobacco Use  Smoking status: Never  Smokeless tobacco: Never  Substance and Sexual Activity  Alcohol use: Yes  Drug use: Not on file  Sexual activity: Not on file  Other Topics Concern  Not on file  Social History Narrative  Not on file   Social Drivers of Health   Food Insecurity: Not on file  Transportation Needs: Not on file  Safety: Not on file  Living Situation: Not on file   Current Medications[3]  A complete ROS was performed  with pertinent positives/negatives noted in the HPI. The remainder of the ROS are negative.   Physical Exam:   Temp 97.5 F (36.4 C)  Ht 1.626 m (5' 4)  Wt 57.7 kg (127 lb 3.2 oz)  BMI 21.83 kg/m   General: Well developed, well nourished. No acute distress. Voice normal.  Head/Face: Normocephalic, atraumatic. No scars or lesions. No sinus tenderness. Facial nerve intact and equal bilaterally. No facial lacerations. TMJ nontender to palpation.  Eyes: Pupils are equal, round and reactive to light. Conjunctiva and lids are normal. Normal extraocular mobility.  Ears: Right: Pinna and external meatus normal, normal ear canal skin and caliber without excessive cerumen or drainage. Tympanic membrane intact without effusion or infection.  Left: Pinna and external meatus normal, normal ear canal skin and caliber without excessive cerumen or drainage. Tympanic membrane intact without effusion or infection.  Hearing: Normal speech reception to conversational tone.  Nose: No gross deformity or lesions. Clear to purulent drainage discharge from the right naris with sinus pressure on the right. Septum deviated to left, +3 turbinate hypertrophy on Right/ +2 turbinate hypertrophy on left with normal mucosa.  Mouth/Oropharynx: Lips normal, teeth and gums normal with good dentition, normal oral vestibule. Normal floor of mouth, tongue and oral mucosa, no mucosal lesions, ulcer or mass, normal tongue mobility. Uvula midline. Hard and soft palate normal with normal mobility. symmetric tonsils, no erythema or exudate.  Larynx: See TFL if applicable  Nasopharynx: See TFL if applicable  Neck: Trachea midline. No masses. No crepitus. Normal thyroid   gland palpation without thyromegaly or nodules palpated. Salivary glands normal to palpation without swelling, erythema or mass.  Lymphatic: No lymphadenopathy in the neck.  Respiratory: No stridor or distress.  Extremities: No edema or cyanosis. Warm and  well-perfused.  Neurologic: CN II-XII intact. Alert and oriented to self, place and time. Normal reflexes and motor skills, balance and coordination. Moving all extremities without gross abnormality.  Psychiatric: No unusual anxiety or evidence of depression. Appropriate affect.   Independent Review of Additional Tests or Records:   Medical Records  Procedures:  Flexible Fiberoptic Endoscopy (CPT [phone]):  Indication for procedure: Flexible laryngoscopy was indicated for right-sided sinus pressure, cough, congestion, post nasal drip  After verbal consent was obtained, 4% lidocaine  and oxymetazoline was sprayed into the patient's bilateral nasal cavities. A flexible fiberoptic laryngoscope was passed through the patient's Right naris down to the level of the cords.  Nasal cavity: No gross deformity or lesions. Clear to purulent drainage discharge from the right naris with sinus pressure on the right. Septum deviated to left, +3 turbinate hypertrophy on Right/ +2 turbinate hypertrophy on left with normal mucosa. Nasopharynx: The eustachian tube orifice and fossa of Rossenmuller are normal. There are no masses or lesions.  Oropharynx & Hypopharynx: The base of tongue, vallecula, pyriform sinuses, and post-cricoid area are normal. There are no masses or lesions. There is no pooling of secretions.  Larynx: The a-e folds, arytenoids, false cords are normal. The vocal cords were mobile bilaterally with abduction and good adduction. There are no masses or lesions. The patient has good sensation with a strong gag reflex.  Subglottis: Visualizing the subglottis in an awake patient during an outpatient exam is less reliable than for a sedated patient in the OR setting. However, the subglottis below the vocal cords and the trachea were visualized and appeared normal with no evidence of stenosis.      Impression & Plans:  Becky [name] is a 68 y.o. female with   1. Acute sinusitis, recurrence not  specified, unspecified location CT Facial Bones WO Contrast   2. Sinus pressure   3. Sinus congestion    Assessment & Plan Patient reports 1 month course of sinus congestion with pain on the right side she has penicillin allergy  as well as other antibiotic allergies which make it difficult to find treatment that will improve her symptoms she was started on a course of doxycycline but due to vomiting discontinued that on day 1 by her primary care doctor penicillin reaction includes hives she also has reaction to Macrobid she was put on as SMZ-TMP for 1 week without improvement of symptoms. CT sinus scan done in office today shows acute sinusitis of the right ethmoid and maxillary sinuses. Will treat with a third-generation cephalosporin (Cefixime 400 mg daily for 10 days) and prednisone  20 mg once daily for 1 week and will have patient follow-up in office in 3 weeks. If patient is not still symptomatic at next visit referral to an ENT surgeon may be considered.

## 2024-02-12 NOTE — Anesthesia Preprocedure Evaluation (Addendum)
 Anesthesia Evaluation  Patient identified by MRN, date of birth, ID band Patient awake    Reviewed: Allergy  & Precautions, NPO status , Patient's Chart, lab work & pertinent test results  History of Anesthesia Complications Negative for: history of anesthetic complications  Airway Mallampati: II  TM Distance: >3 FB Neck ROM: Full    Dental no notable dental hx.    Pulmonary neg pulmonary ROS   Pulmonary exam normal        Cardiovascular hypertension, Pt. on medications Normal cardiovascular exam     Neuro/Psych  Headaches  Anxiety Depression       GI/Hepatic Neg liver ROS, PUD,GERD  Medicated and Controlled,,  Endo/Other  diabetes, Type 1, Insulin  Dependent    Renal/GU negative Renal ROS  negative genitourinary   Musculoskeletal  (+) Arthritis , Rheumatoid disorders,    Abdominal   Peds  Hematology negative hematology ROS (+)   Anesthesia Other Findings Chronic pansinusitis  Reproductive/Obstetrics negative OB ROS                              Anesthesia Physical Anesthesia Plan  ASA: 2  Anesthesia Plan: General   Post-op Pain Management: Tylenol  PO (pre-op)*   Induction: Intravenous  PONV Risk Score and Plan: 3 and Treatment may vary due to age or medical condition, Ondansetron , Dexamethasone , Midazolam , TIVA and Propofol  infusion  Airway Management Planned: Oral ETT  Additional Equipment: None  Intra-op Plan:   Post-operative Plan: Extubation in OR  Informed Consent: I have reviewed the patients History and Physical, chart, labs and discussed the procedure including the risks, benefits and alternatives for the proposed anesthesia with the patient or authorized representative who has indicated his/her understanding and acceptance.     Dental advisory given  Plan Discussed with: CRNA  Anesthesia Plan Comments:          Anesthesia Quick Evaluation

## 2024-02-13 ENCOUNTER — Ambulatory Visit (HOSPITAL_BASED_OUTPATIENT_CLINIC_OR_DEPARTMENT_OTHER): Payer: Self-pay | Admitting: Anesthesiology

## 2024-02-13 ENCOUNTER — Other Ambulatory Visit: Payer: Self-pay

## 2024-02-13 ENCOUNTER — Encounter (HOSPITAL_BASED_OUTPATIENT_CLINIC_OR_DEPARTMENT_OTHER): Payer: Self-pay | Admitting: Otolaryngology

## 2024-02-13 ENCOUNTER — Encounter (HOSPITAL_BASED_OUTPATIENT_CLINIC_OR_DEPARTMENT_OTHER)
Admission: RE | Disposition: A | Discharge status: Home / Self Care | Payer: Self-pay | Source: Home / Self Care | Attending: Otolaryngology

## 2024-02-13 ENCOUNTER — Ambulatory Visit (HOSPITAL_BASED_OUTPATIENT_CLINIC_OR_DEPARTMENT_OTHER)
Admission: RE | Admit: 2024-02-13 | Discharge: 2024-02-13 | Disposition: A | Discharge status: Home / Self Care | Attending: Otolaryngology | Admitting: Otolaryngology

## 2024-02-13 DIAGNOSIS — M810 Age-related osteoporosis without current pathological fracture: Secondary | ICD-10-CM | POA: Diagnosis not present

## 2024-02-13 DIAGNOSIS — K279 Peptic ulcer, site unspecified, unspecified as acute or chronic, without hemorrhage or perforation: Secondary | ICD-10-CM | POA: Insufficient documentation

## 2024-02-13 DIAGNOSIS — M069 Rheumatoid arthritis, unspecified: Secondary | ICD-10-CM | POA: Diagnosis not present

## 2024-02-13 DIAGNOSIS — Z794 Long term (current) use of insulin: Secondary | ICD-10-CM | POA: Diagnosis not present

## 2024-02-13 DIAGNOSIS — J324 Chronic pansinusitis: Secondary | ICD-10-CM

## 2024-02-13 DIAGNOSIS — K219 Gastro-esophageal reflux disease without esophagitis: Secondary | ICD-10-CM | POA: Insufficient documentation

## 2024-02-13 DIAGNOSIS — E109 Type 1 diabetes mellitus without complications: Secondary | ICD-10-CM | POA: Diagnosis not present

## 2024-02-13 DIAGNOSIS — I10 Essential (primary) hypertension: Secondary | ICD-10-CM | POA: Insufficient documentation

## 2024-02-13 DIAGNOSIS — Z01818 Encounter for other preprocedural examination: Secondary | ICD-10-CM

## 2024-02-13 DIAGNOSIS — J343 Hypertrophy of nasal turbinates: Secondary | ICD-10-CM | POA: Insufficient documentation

## 2024-02-13 DIAGNOSIS — J329 Chronic sinusitis, unspecified: Secondary | ICD-10-CM | POA: Diagnosis not present

## 2024-02-13 HISTORY — DX: Essential (primary) hypertension: I10

## 2024-02-13 LAB — GLUCOSE, CAPILLARY
Glucose-Capillary: 180 mg/dL — ABNORMAL HIGH (ref 70–99)
Glucose-Capillary: 189 mg/dL — ABNORMAL HIGH (ref 70–99)

## 2024-02-13 SURGERY — SINUS SURGERY, ENDOSCOPIC
Anesthesia: General | Site: Nose | Laterality: Right

## 2024-02-13 MED ORDER — OXYMETAZOLINE HCL 0.05 % NA SOLN
NASAL | Status: DC | PRN
Start: 2024-02-13 — End: 2024-02-13
  Administered 2024-02-13: 1 via TOPICAL

## 2024-02-13 MED ORDER — PROPOFOL 500 MG/50ML IV EMUL
INTRAVENOUS | Status: DC | PRN
  Administered 2024-02-13: 150 ug/kg/min via INTRAVENOUS

## 2024-02-13 MED ORDER — LACTATED RINGERS IV SOLN: INTRAVENOUS | Status: DC

## 2024-02-13 MED ORDER — ACETAMINOPHEN 500 MG PO TABS
ORAL_TABLET | ORAL | Status: AC
  Filled 2024-02-13: qty 2

## 2024-02-13 MED ORDER — OXYMETAZOLINE HCL 0.05 % NA SOLN
NASAL | Status: AC
  Filled 2024-02-13: qty 30

## 2024-02-13 MED ORDER — LIDOCAINE HCL (CARDIAC) PF 100 MG/5ML IV SOSY
PREFILLED_SYRINGE | INTRAVENOUS | Status: DC | PRN
  Administered 2024-02-13: 60 mg via INTRAVENOUS

## 2024-02-13 MED ORDER — ACETAMINOPHEN 500 MG PO TABS
1000.0000 mg | ORAL_TABLET | Freq: Once | ORAL | Status: AC
  Administered 2024-02-13: 1000 mg via ORAL

## 2024-02-13 MED ORDER — DROPERIDOL 2.5 MG/ML IJ SOLN: 0.6250 mg | Freq: Once | INTRAMUSCULAR | Status: DC | PRN

## 2024-02-13 MED ORDER — SUGAMMADEX SODIUM 200 MG/2ML IV SOLN
INTRAVENOUS | Status: DC | PRN
  Administered 2024-02-13: 120 mg via INTRAVENOUS

## 2024-02-13 MED ORDER — FENTANYL CITRATE (PF) 100 MCG/2ML IJ SOLN
INTRAMUSCULAR | Status: DC | PRN
  Administered 2024-02-13: 50 ug via INTRAVENOUS

## 2024-02-13 MED ORDER — ONDANSETRON HCL 4 MG/2ML IJ SOLN
INTRAMUSCULAR | Status: DC | PRN
  Administered 2024-02-13: 4 mg via INTRAVENOUS

## 2024-02-13 MED ORDER — ROCURONIUM BROMIDE 100 MG/10ML IV SOLN
INTRAVENOUS | Status: DC | PRN
  Administered 2024-02-13: 40 mg via INTRAVENOUS

## 2024-02-13 MED ORDER — LIDOCAINE-EPINEPHRINE 1 %-1:100000 IJ SOLN
INTRAMUSCULAR | Status: DC | PRN
  Administered 2024-02-13: 2 mL

## 2024-02-13 MED ORDER — FENTANYL CITRATE (PF) 100 MCG/2ML IJ SOLN
INTRAMUSCULAR | Status: AC
  Filled 2024-02-13: qty 2

## 2024-02-13 MED ORDER — MIDAZOLAM HCL 2 MG/2ML IJ SOLN
INTRAMUSCULAR | Status: DC | PRN
  Administered 2024-02-13: 1 mg via INTRAVENOUS

## 2024-02-13 MED ORDER — HYDROCODONE-ACETAMINOPHEN 5-325 MG PO TABS: 1.0000 | ORAL_TABLET | Freq: Four times a day (QID) | ORAL | 0 refills | Status: AC | PRN

## 2024-02-13 MED ORDER — MIDAZOLAM HCL 2 MG/2ML IJ SOLN
INTRAMUSCULAR | Status: AC
  Filled 2024-02-13: qty 2

## 2024-02-13 MED ORDER — PROPOFOL 10 MG/ML IV BOLUS
INTRAVENOUS | Status: DC | PRN
  Administered 2024-02-13: 40 mg via INTRAVENOUS
  Administered 2024-02-13: 110 mg via INTRAVENOUS

## 2024-02-13 MED ORDER — PHENYLEPHRINE HCL (PRESSORS) 10 MG/ML IV SOLN
INTRAVENOUS | Status: DC | PRN
  Administered 2024-02-13: 80 ug via INTRAVENOUS

## 2024-02-13 MED ORDER — FENTANYL CITRATE (PF) 100 MCG/2ML IJ SOLN: 25.0000 ug | INTRAMUSCULAR | Status: DC | PRN

## 2024-02-13 MED ORDER — 0.9 % SODIUM CHLORIDE (POUR BTL) OPTIME
TOPICAL | Status: DC | PRN
  Administered 2024-02-13: 120 mL

## 2024-02-13 MED ORDER — ONDANSETRON 4 MG PO TBDP: 4.0000 mg | ORAL_TABLET | Freq: Three times a day (TID) | ORAL | 0 refills | Status: AC | PRN

## 2024-02-13 MED ORDER — OXYCODONE HCL 5 MG PO TABS: 5.0000 mg | ORAL_TABLET | Freq: Once | ORAL | Status: DC | PRN

## 2024-02-13 MED ORDER — OXYMETAZOLINE HCL 0.05 % NA SOLN
2.0000 | NASAL | Status: DC
  Administered 2024-02-13: 2 via NASAL

## 2024-02-13 MED ORDER — OXYCODONE HCL 5 MG/5ML PO SOLN: 5.0000 mg | Freq: Once | ORAL | Status: DC | PRN

## 2024-02-13 SURGICAL SUPPLY — 38 items
BLADE RAD40 ROTATE 4M 4 5PK (BLADE) IMPLANT
BLADE RAD60 ROTATE M4 4 5PK (BLADE) IMPLANT
BLADE TRICUT ROTATE M4 4 5PK (BLADE) IMPLANT
BUR HS RAD FRONTAL 3 (BURR) IMPLANT
CANISTER SUC SOCK COL 7IN (MISCELLANEOUS) ×2 IMPLANT
CANISTER SUCT 1200ML W/VALVE (MISCELLANEOUS) ×4 IMPLANT
CORD BIPOLAR FORCEPS 12FT (ELECTRODE) IMPLANT
DEFOGGER MIRROR 1QT (MISCELLANEOUS) ×2 IMPLANT
DRESSING NASAL KENNEDY 3.5X.9 (MISCELLANEOUS) IMPLANT
DRSG CURAD 3X16 NADH (PACKING) IMPLANT
DRSG NASAL KENNEDY LMNT 8CM (GAUZE/BANDAGES/DRESSINGS) IMPLANT
DRSG NASOPORE 8CM (GAUZE/BANDAGES/DRESSINGS) ×2 IMPLANT
DRSG TELFA 3X8 NADH STRL (GAUZE/BANDAGES/DRESSINGS) IMPLANT
FORCEPS BIPOLAR SPETZLER 8 1.0 (NEUROSURGERY SUPPLIES) IMPLANT
GAUZE 4X4 16PLY ~~LOC~~+RFID DBL (SPONGE) IMPLANT
GAUZE SPONGE 2X2 STRL 8-PLY (GAUZE/BANDAGES/DRESSINGS) ×2 IMPLANT
GAUZE SPONGE 4X4 12PLY STRL LF (GAUZE/BANDAGES/DRESSINGS) IMPLANT
GAUZE VASELINE FOILPK 1/2 X 72 (GAUZE/BANDAGES/DRESSINGS) IMPLANT
GLOVE ECLIPSE 7.5 STRL STRAW (GLOVE) ×2 IMPLANT
GOWN STRL REUS W/ TWL LRG LVL3 (GOWN DISPOSABLE) ×4 IMPLANT
HEMOSTAT SURGICEL .5X2 ABSORB (HEMOSTASIS) IMPLANT
IV NS 500ML BAXH (IV SOLUTION) IMPLANT
IV SODIUM CHL 0.9% 250ML (IV SOLUTION) IMPLANT
NDL PRECISIONGLIDE 27X1.5 (NEEDLE) ×2 IMPLANT
NDL SPNL 25GX3.5 QUINCKE BL (NEEDLE) IMPLANT
NEEDLE PRECISIONGLIDE 27X1.5 (NEEDLE) ×1 IMPLANT
NEEDLE SPNL 25GX3.5 QUINCKE BL (NEEDLE) IMPLANT
NS IRRIG 1000ML POUR BTL (IV SOLUTION) ×2 IMPLANT
PACK BASIN DAY SURGERY FS (CUSTOM PROCEDURE TRAY) ×2 IMPLANT
PACK ENT DAY SURGERY (CUSTOM PROCEDURE TRAY) ×2 IMPLANT
PAD MAGNETIC INSTR ST 16X20 (MISCELLANEOUS) IMPLANT
PATTIES SURGICAL .5 X3 (DISPOSABLE) ×2 IMPLANT
SLEEVE SCD COMPRESS KNEE MED (STOCKING) ×2 IMPLANT
SPIKE FLUID TRANSFER (MISCELLANEOUS) IMPLANT
SPONGE SURGIFOAM ABS GEL 12-7 (HEMOSTASIS) IMPLANT
TOWEL GREEN STERILE FF (TOWEL DISPOSABLE) ×2 IMPLANT
TUBE CONNECTING 20X1/4 (TUBING) IMPLANT
YANKAUER SUCT BULB TIP NO VENT (SUCTIONS) ×2 IMPLANT

## 2024-02-13 NOTE — Discharge Instructions (Addendum)
 Use saline nasal spray on the right side every hour while awake.  Okay to use Tylenol  and/or Motrin  as needed.  If you need something stronger the prescription pain medicine will be available.  No Tylenol  until after 1pm today if needed.  Resume Humira  next week  No nose-blowing for 2 weeks.     Post Anesthesia Home Care Instructions  Activity: Get plenty of rest for the remainder of the day. A responsible individual must stay with you for 24 hours following the procedure.  For the next 24 hours, DO NOT: -Drive a car -Advertising copywriter -Drink alcoholic beverages -Take any medication unless instructed by your physician -Make any legal decisions or sign important papers.  Meals: Start with liquid foods such as gelatin or soup. Progress to regular foods as tolerated. Avoid greasy, spicy, heavy foods. If nausea and/or vomiting occur, drink only clear liquids until the nausea and/or vomiting subsides. Call your physician if vomiting continues.  Special Instructions/Symptoms: Your throat may feel dry or sore from the anesthesia or the breathing tube placed in your throat during surgery. If this causes discomfort, gargle with warm salt water. The discomfort should disappear within 24 hours.  If you had a scopolamine patch placed behind your ear for the management of post- operative nausea and/or vomiting:  1. The medication in the patch is effective for 72 hours, after which it should be removed.  Wrap patch in a tissue and discard in the trash. Wash hands thoroughly with soap and water. 2. You may remove the patch earlier than 72 hours if you experience unpleasant side effects which may include dry mouth, dizziness or visual disturbances. 3. Avoid touching the patch. Wash your hands with soap and water after contact with the patch.

## 2024-02-13 NOTE — Op Note (Signed)
 OPERATIVE REPORT  DATE OF SURGERY: 02/13/2024  PATIENT:  Becky [name],  68 y.o. female  PRE-OPERATIVE DIAGNOSIS:  Chronic pansinusitis  POST-OPERATIVE DIAGNOSIS:  Chronic pansinusitis  PROCEDURE:  Procedure(s): SINUS SURGERY, ENDOSCOPIC, right maxillary and right ethmoid  SURGEON:  Ida VEAR Loader, MD  ASSISTANTS: None  ANESTHESIA:   General   EBL: 50 ml  DRAINS: None  LOCAL MEDICATIONS USED: 1% Xylocaine  with epinephrine   SPECIMEN: Right maxillary and ethmoid sinus contents  COUNTS:  Correct  PROCEDURE DETAILS: The patient was taken to the operating room and placed on the operating table in the supine position. Following induction of general endotracheal anesthesia, the face was prepped and draped in standard fashion for sinus surgery.  Oxymetazoline  was applied with pledgets and drops to the right nasal cavity.  1% Xylocaine  with epinephrine  was infiltrated into the superior and posterior attachments of the right middle turbinate and the lateral nasal wall.  Right endoscopic ethmoidectomy.  The uncinate process was taken down in its place with a sickle knife.  The microdebrider was then used to perform an anterior ethmoidectomy.  The fovea was kept intact superiorly.  There was diffuse polypoid mucosal changes throughout.  The granuloma was kept intact preserving the posterior cells.  The middle turbinate was kept intact.  The lateral ethmoid cells revealed severe thickening of the bone with osteitic changes and after this was removed there was a small dehiscence noted in the lamina papyracea.  There was no bleeding in this area.  Frontal recess was cleaned of polypoid disease as well and the frontal sinus was widely patent following that.  The ethmoid cavity was packed with plain packing and a NasoPore dressing at the end of the procedure.  Right endoscopic maxillary antrostomy.  The antrum was entered through the fontanelle.  The antrostomy was enlarged anteriorly using a  backbiting forceps and inferiorly using straight Tru-Cut forceps.  The antrum itself was clear but obstructed prior to the antrostomy.  The pharynx was suctioned of blood and secretions.  The patient was awakened extubated and transferred to recovery in stable condition.    PATIENT DISPOSITION:  To PACU, stable

## 2024-02-13 NOTE — Anesthesia Postprocedure Evaluation (Signed)
 Anesthesia Post Note  [name]  Procedure(s) Performed: SINUS SURGERY, ENDOSCOPIC (Right: Nose)     Patient location during evaluation: PACU Anesthesia Type: General Level of consciousness: awake and alert Pain management: pain level controlled Vital Signs Assessment: post-procedure vital signs reviewed and stable Respiratory status: spontaneous breathing, nonlabored ventilation and respiratory function stable Cardiovascular status: blood pressure returned to baseline Postop Assessment: no apparent nausea or vomiting Anesthetic complications: no   No notable events documented.  Last Vitals:  Vitals:   02/13/24 0905 02/13/24 0915  BP:  (!) 166/56  Pulse: 84 84  Resp: 12 13  Temp:    SpO2: 100% 95%    Last Pain:  Vitals:   02/13/24 0933  TempSrc:   PainSc: 0-No pain                 Vertell Row

## 2024-02-13 NOTE — Transfer of Care (Signed)
 Immediate Anesthesia Transfer of Care Note  [name]  Procedure(s) Performed: SINUS SURGERY, ENDOSCOPIC (Right: Nose)  Patient Location: PACU  Anesthesia Type:General  Level of Consciousness: awake, alert , oriented, and patient cooperative  Airway & Oxygen Therapy: Patient Spontanous Breathing and Patient connected to face mask oxygen  Post-op Assessment: Report given to RN and Post -op Vital signs reviewed and stable  Post vital signs: Reviewed and stable  Last Vitals:  Vitals Value Taken Time  BP 151/51 02/13/24 08:48  Temp    Pulse 77 02/13/24 08:50  Resp 16 02/13/24 08:50  SpO2 99 % 02/13/24 08:50  Vitals shown include unfiled device data.  Last Pain:  Vitals:   02/13/24 0654  TempSrc: Temporal  PainSc: 0-No pain         Complications: No notable events documented.

## 2024-02-13 NOTE — Anesthesia Procedure Notes (Signed)
 Procedure Name: Intubation Date/Time: 02/13/2024 7:51 AM  Performed by: Donnell Berwyn SQUIBB, CRNAPre-anesthesia Checklist: Patient identified, Emergency Drugs available, Suction available, Patient being monitored and Timeout performed Patient Re-evaluated:Patient Re-evaluated prior to induction Oxygen Delivery Method: Circle system utilized Preoxygenation: Pre-oxygenation with 100% oxygen Induction Type: IV induction Ventilation: Mask ventilation without difficulty Laryngoscope Size: Mac and 3 Grade View: Grade II Tube type: Oral Tube size: 7.0 mm Number of attempts: 1 Airway Equipment and Method: Stylet Placement Confirmation: ETT inserted through vocal cords under direct vision, positive ETCO2 and breath sounds checked- equal and bilateral Secured at: 21 cm Tube secured with: Tape Dental Injury: Teeth and Oropharynx as per pre-operative assessment

## 2024-02-13 NOTE — Interval H&P Note (Signed)
 History and Physical Interval Note:  02/13/2024 7:20 AM  Becky [name]  has presented today for surgery, with the diagnosis of Chronic pansinusitis.  The various methods of treatment have been discussed with the patient and family. After consideration of risks, benefits and other options for treatment, the patient has consented to  Procedure(s) with comments: SINUS SURGERY, ENDOSCOPIC (Right) - Endoscopic sinus surgery with partial ethmoidectomy and maxillary antrostomy as a surgical intervention.  The patient's history has been reviewed, patient examined, no change in status, stable for surgery.  I have reviewed the patient's chart and labs.  Questions were answered to the patient's satisfaction.     Ida Loader

## 2024-02-14 ENCOUNTER — Encounter (HOSPITAL_BASED_OUTPATIENT_CLINIC_OR_DEPARTMENT_OTHER): Payer: Self-pay | Admitting: Otolaryngology

## 2024-02-14 LAB — SURGICAL PATHOLOGY

## 2024-02-28 DIAGNOSIS — J324 Chronic pansinusitis: Secondary | ICD-10-CM | POA: Diagnosis not present

## 2024-03-06 DIAGNOSIS — E785 Hyperlipidemia, unspecified: Secondary | ICD-10-CM | POA: Diagnosis not present

## 2024-03-06 DIAGNOSIS — E103591 Type 1 diabetes mellitus with proliferative diabetic retinopathy without macular edema, right eye: Secondary | ICD-10-CM | POA: Diagnosis not present

## 2024-03-06 DIAGNOSIS — H04123 Dry eye syndrome of bilateral lacrimal glands: Secondary | ICD-10-CM | POA: Diagnosis not present

## 2024-03-06 DIAGNOSIS — D649 Anemia, unspecified: Secondary | ICD-10-CM | POA: Diagnosis not present

## 2024-03-06 DIAGNOSIS — H43811 Vitreous degeneration, right eye: Secondary | ICD-10-CM | POA: Diagnosis not present

## 2024-03-06 DIAGNOSIS — E1040 Type 1 diabetes mellitus with diabetic neuropathy, unspecified: Secondary | ICD-10-CM | POA: Diagnosis not present

## 2024-03-06 DIAGNOSIS — H35372 Puckering of macula, left eye: Secondary | ICD-10-CM | POA: Diagnosis not present

## 2024-03-06 DIAGNOSIS — M810 Age-related osteoporosis without current pathological fracture: Secondary | ICD-10-CM | POA: Diagnosis not present

## 2024-03-06 DIAGNOSIS — E103512 Type 1 diabetes mellitus with proliferative diabetic retinopathy with macular edema, left eye: Secondary | ICD-10-CM | POA: Diagnosis not present

## 2024-03-09 DIAGNOSIS — Z1339 Encounter for screening examination for other mental health and behavioral disorders: Secondary | ICD-10-CM | POA: Diagnosis not present

## 2024-03-09 DIAGNOSIS — Z0000 Encounter for general adult medical examination without abnormal findings: Secondary | ICD-10-CM | POA: Diagnosis not present

## 2024-03-09 DIAGNOSIS — R82998 Other abnormal findings in urine: Secondary | ICD-10-CM | POA: Diagnosis not present

## 2024-03-09 DIAGNOSIS — I129 Hypertensive chronic kidney disease with stage 1 through stage 4 chronic kidney disease, or unspecified chronic kidney disease: Secondary | ICD-10-CM | POA: Diagnosis not present

## 2024-03-09 DIAGNOSIS — E1040 Type 1 diabetes mellitus with diabetic neuropathy, unspecified: Secondary | ICD-10-CM | POA: Diagnosis not present

## 2024-03-09 DIAGNOSIS — Z1331 Encounter for screening for depression: Secondary | ICD-10-CM | POA: Diagnosis not present

## 2024-03-09 DIAGNOSIS — Z23 Encounter for immunization: Secondary | ICD-10-CM | POA: Diagnosis not present

## 2024-03-13 DIAGNOSIS — E103493 Type 1 diabetes mellitus with severe nonproliferative diabetic retinopathy without macular edema, bilateral: Secondary | ICD-10-CM | POA: Diagnosis not present

## 2024-03-13 DIAGNOSIS — E1040 Type 1 diabetes mellitus with diabetic neuropathy, unspecified: Secondary | ICD-10-CM | POA: Diagnosis not present

## 2024-03-27 DIAGNOSIS — J324 Chronic pansinusitis: Secondary | ICD-10-CM | POA: Diagnosis not present

## 2024-04-05 DIAGNOSIS — H04123 Dry eye syndrome of bilateral lacrimal glands: Secondary | ICD-10-CM | POA: Diagnosis not present

## 2024-04-05 DIAGNOSIS — E103591 Type 1 diabetes mellitus with proliferative diabetic retinopathy without macular edema, right eye: Secondary | ICD-10-CM | POA: Diagnosis not present

## 2024-04-05 DIAGNOSIS — E103512 Type 1 diabetes mellitus with proliferative diabetic retinopathy with macular edema, left eye: Secondary | ICD-10-CM | POA: Diagnosis not present

## 2024-04-05 DIAGNOSIS — H43811 Vitreous degeneration, right eye: Secondary | ICD-10-CM | POA: Diagnosis not present

## 2024-04-05 DIAGNOSIS — H35372 Puckering of macula, left eye: Secondary | ICD-10-CM | POA: Diagnosis not present

## 2024-04-17 DIAGNOSIS — M542 Cervicalgia: Secondary | ICD-10-CM | POA: Diagnosis not present

## 2024-04-17 DIAGNOSIS — G43719 Chronic migraine without aura, intractable, without status migrainosus: Secondary | ICD-10-CM | POA: Diagnosis not present

## 2024-04-17 DIAGNOSIS — G518 Other disorders of facial nerve: Secondary | ICD-10-CM | POA: Diagnosis not present

## 2024-04-17 DIAGNOSIS — M791 Myalgia, unspecified site: Secondary | ICD-10-CM | POA: Diagnosis not present

## 2024-04-19 DIAGNOSIS — Z1231 Encounter for screening mammogram for malignant neoplasm of breast: Secondary | ICD-10-CM | POA: Diagnosis not present

## 2024-04-19 LAB — HM MAMMOGRAPHY

## 2024-04-23 ENCOUNTER — Ambulatory Visit: Payer: Self-pay | Admitting: Obstetrics and Gynecology

## 2024-05-07 ENCOUNTER — Telehealth: Payer: Self-pay | Admitting: Neurology

## 2024-05-07 DIAGNOSIS — E103591 Type 1 diabetes mellitus with proliferative diabetic retinopathy without macular edema, right eye: Secondary | ICD-10-CM | POA: Diagnosis not present

## 2024-05-07 DIAGNOSIS — E103512 Type 1 diabetes mellitus with proliferative diabetic retinopathy with macular edema, left eye: Secondary | ICD-10-CM | POA: Diagnosis not present

## 2024-05-07 DIAGNOSIS — H35372 Puckering of macula, left eye: Secondary | ICD-10-CM | POA: Diagnosis not present

## 2024-05-07 DIAGNOSIS — H43811 Vitreous degeneration, right eye: Secondary | ICD-10-CM | POA: Diagnosis not present

## 2024-05-07 DIAGNOSIS — H04123 Dry eye syndrome of bilateral lacrimal glands: Secondary | ICD-10-CM | POA: Diagnosis not present

## 2024-05-07 NOTE — Telephone Encounter (Signed)
 [place] [employer] medicare no auth req.  Patient is scheduled at [facility] for 06/03/2024 at 8 pm.  Mailed packet and sent mychart

## 2024-05-10 DIAGNOSIS — I129 Hypertensive chronic kidney disease with stage 1 through stage 4 chronic kidney disease, or unspecified chronic kidney disease: Secondary | ICD-10-CM | POA: Diagnosis not present

## 2024-05-10 DIAGNOSIS — E1040 Type 1 diabetes mellitus with diabetic neuropathy, unspecified: Secondary | ICD-10-CM | POA: Diagnosis not present

## 2024-05-21 ENCOUNTER — Other Ambulatory Visit: Payer: Self-pay

## 2024-05-21 ENCOUNTER — Telehealth: Payer: Self-pay | Admitting: *Deleted

## 2024-05-21 DIAGNOSIS — H209 Unspecified iridocyclitis: Secondary | ICD-10-CM

## 2024-05-21 DIAGNOSIS — Z111 Encounter for screening for respiratory tuberculosis: Secondary | ICD-10-CM

## 2024-05-21 DIAGNOSIS — Z79899 Other long term (current) drug therapy: Secondary | ICD-10-CM

## 2024-05-21 DIAGNOSIS — M0579 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 without organ or systems involvement: Secondary | ICD-10-CM

## 2024-05-21 NOTE — Telephone Encounter (Signed)
 Last Fill: 01/09/2024  Labs: 01/25/2024 Glucose is elevated-215. Rest of CMP WNL. CBC stable.  ESR WNL Complements WNL  SPEP-no abnormal protein bands-good news!  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   Advised patient that she is due to update labs. Patient verbalized understanding. Provided patient with lab hours.   Okay to refill humira ?

## 2024-05-21 NOTE — Telephone Encounter (Signed)
 Patient contacted the office and states she is having a flare in her right hand. Patient states the knuckle on the middle finger is swollen. Patient states this has been going on for a few weeks. Patient states she has not missed any doses of Humira . Patient states she would like to know if a round of prednisone  may help. Please advise.

## 2024-05-21 NOTE — Telephone Encounter (Signed)
 Please clarify if blood glucose has been well controlled? Any recent Hgb A1c checked? Does she have a way to adjust medications in glucose increases while taking prednisone ?   Can consider a low dose taper starting at 10 mg tapering by 2.5 mg every 4 days.  Take in the morning with food and avoid the use of NSAIDs

## 2024-05-22 MED ORDER — HUMIRA (2 PEN) 40 MG/0.4ML ~~LOC~~ AJKT: 40.0000 mg | AUTO-INJECTOR | SUBCUTANEOUS | 0 refills | Status: AC

## 2024-05-22 MED ORDER — PREDNISONE 2.5 MG PO TABS: ORAL_TABLET | ORAL | 0 refills | Status: AC

## 2024-05-22 NOTE — Addendum Note (Signed)
 Addended by: CENA ALFONSO CROME on: 05/22/2024 08:55 AM   Modules accepted: Orders

## 2024-05-22 NOTE — Telephone Encounter (Signed)
 Patient advised she will require clearance by ophthalmology prior to starting plaquenil . Patient states she sees them next week and will talk to them about it.

## 2024-05-22 NOTE — Telephone Encounter (Signed)
 Reached out to patient and she states her blood glucose is well controlled. Patient states she has recently had a Hgb A1C checked and it was 7.6. Patient states she has an insulin  pump and is able to adjust it while taking the prednisone .   Can consider a low dose taper starting at 10 mg tapering by 2.5 mg every 4 days. Take in the morning with food and avoid the use of NSAIDs

## 2024-05-22 NOTE — Telephone Encounter (Signed)
 Patient called and need to r/s her SS until after the holidays.   She is r/s for 1/11/20226 at 8 pm.  Mailed new packet and sent mychart

## 2024-05-22 NOTE — Telephone Encounter (Signed)
 Patient states she has not been taking the plaquenil  but is seeing the eye doctor very frequently right now for eye injections. If you would like her to take the plaquenil , she will discuss with her ophthalmologist.

## 2024-05-22 NOTE — Telephone Encounter (Signed)
 She will require clearance by ophthalmology prior to starting plaquenil .

## 2024-05-28 DIAGNOSIS — I129 Hypertensive chronic kidney disease with stage 1 through stage 4 chronic kidney disease, or unspecified chronic kidney disease: Secondary | ICD-10-CM | POA: Diagnosis not present

## 2024-05-28 DIAGNOSIS — E1040 Type 1 diabetes mellitus with diabetic neuropathy, unspecified: Secondary | ICD-10-CM | POA: Diagnosis not present

## 2024-06-03 ENCOUNTER — Encounter

## 2024-06-05 ENCOUNTER — Telehealth: Payer: Self-pay

## 2024-06-05 NOTE — Telephone Encounter (Signed)
 Signed prescriber form faxed to [facility] with med list, insurance card copy, and PA approval

## 2024-06-05 NOTE — Telephone Encounter (Signed)
 Pt contacted me regarding 2026 renewal for Humira  PAP. Application drafted up and sent to pt via [employer] to pt's preferred email address. Will await it's return.   Provider form will also be drafted and supporting documents will be gathered in preparation for submission.

## 2024-06-07 ENCOUNTER — Other Ambulatory Visit: Payer: Self-pay

## 2024-06-07 DIAGNOSIS — E103591 Type 1 diabetes mellitus with proliferative diabetic retinopathy without macular edema, right eye: Secondary | ICD-10-CM | POA: Diagnosis not present

## 2024-06-07 DIAGNOSIS — Z111 Encounter for screening for respiratory tuberculosis: Secondary | ICD-10-CM

## 2024-06-07 DIAGNOSIS — H04123 Dry eye syndrome of bilateral lacrimal glands: Secondary | ICD-10-CM | POA: Diagnosis not present

## 2024-06-07 DIAGNOSIS — H35372 Puckering of macula, left eye: Secondary | ICD-10-CM | POA: Diagnosis not present

## 2024-06-07 DIAGNOSIS — E103512 Type 1 diabetes mellitus with proliferative diabetic retinopathy with macular edema, left eye: Secondary | ICD-10-CM | POA: Diagnosis not present

## 2024-06-07 DIAGNOSIS — Z79899 Other long term (current) drug therapy: Secondary | ICD-10-CM

## 2024-06-07 DIAGNOSIS — H43811 Vitreous degeneration, right eye: Secondary | ICD-10-CM | POA: Diagnosis not present

## 2024-06-07 NOTE — Telephone Encounter (Signed)
 Submitted Patient Assistance Application to [employer] for HUMIRA  along with patient portion, provider portion, insurance card copy, prior authorization approval, and current medication list. Will update patient when we receive a response.  Phone: [phone] Fax: [phone]

## 2024-06-08 ENCOUNTER — Ambulatory Visit: Payer: Self-pay | Admitting: Physician Assistant

## 2024-06-08 NOTE — Progress Notes (Signed)
 Glucose is 151, rest of CMP WNL Absolute eosinophils remain elevated but have improved.  Rest of CBC WNL.

## 2024-06-11 LAB — COMPREHENSIVE METABOLIC PANEL WITH GFR
AG Ratio: 1.5 (calc) (ref 1.0–2.5)
ALT: 9 U/L (ref 6–29)
AST: 14 U/L (ref 10–35)
Albumin: 4 g/dL (ref 3.6–5.1)
Alkaline phosphatase (APISO): 63 U/L (ref 37–153)
BUN: 15 mg/dL (ref 7–25)
CO2: 28 mmol/L (ref 20–32)
Calcium: 9.8 mg/dL (ref 8.6–10.4)
Chloride: 106 mmol/L (ref 98–110)
Creat: 0.96 mg/dL (ref 0.50–1.05)
Globulin: 2.7 g/dL (ref 1.9–3.7)
Glucose, Bld: 151 mg/dL — ABNORMAL HIGH (ref 65–99)
Potassium: 4.5 mmol/L (ref 3.5–5.3)
Sodium: 141 mmol/L (ref 135–146)
Total Bilirubin: 0.3 mg/dL (ref 0.2–1.2)
Total Protein: 6.7 g/dL (ref 6.1–8.1)
eGFR: 65 mL/min/1.73m2

## 2024-06-11 LAB — CBC WITH DIFFERENTIAL/PLATELET
Absolute Lymphocytes: 2401 {cells}/uL (ref 850–3900)
Absolute Monocytes: 559 {cells}/uL (ref 200–950)
Basophils Absolute: 90 {cells}/uL (ref 0–200)
Basophils Relative: 1.3 %
Eosinophils Absolute: 856 {cells}/uL — ABNORMAL HIGH (ref 15–500)
Eosinophils Relative: 12.4 %
HCT: 43.8 % (ref 35.9–46.0)
Hemoglobin: 14.1 g/dL (ref 11.7–15.5)
MCH: 28.8 pg (ref 27.0–33.0)
MCHC: 32.2 g/dL (ref 31.6–35.4)
MCV: 89.4 fL (ref 81.4–101.7)
MPV: 13.3 fL — ABNORMAL HIGH (ref 7.5–12.5)
Monocytes Relative: 8.1 %
Neutro Abs: 2995 {cells}/uL (ref 1500–7800)
Neutrophils Relative %: 43.4 %
Platelets: 277 Thousand/uL (ref 140–400)
RBC: 4.9 Million/uL (ref 3.80–5.10)
RDW: 14 % (ref 11.0–15.0)
Total Lymphocyte: 34.8 %
WBC: 6.9 Thousand/uL (ref 3.8–10.8)

## 2024-06-11 LAB — QUANTIFERON-TB GOLD PLUS
Mitogen-NIL: >10 [IU]/mL
NIL: 0.02 [IU]/mL
QuantiFERON-TB Gold Plus: NEGATIVE
TB1-NIL: 0.02 [IU]/mL
TB2-NIL: 0.01 [IU]/mL

## 2024-06-12 NOTE — Progress Notes (Signed)
 TB gold negative

## 2024-06-27 ENCOUNTER — Other Ambulatory Visit: Payer: Self-pay | Admitting: *Deleted

## 2024-06-27 MED ORDER — HYDROXYCHLOROQUINE SULFATE 200 MG PO TABS: 200.0000 mg | ORAL_TABLET | Freq: Every day | ORAL | 0 refills | Status: AC

## 2024-06-27 NOTE — Telephone Encounter (Signed)
 Patient called the office to see if we had received anything from the eye Dr. Myles that not at this time we have not received anything but did let her know that [facility] went ahead and sent in the refill for the PLQ to the pharmacy of choice.

## 2024-06-27 NOTE — Addendum Note (Signed)
 Addended by: CENA ALFONSO CROME on: 06/27/2024 11:10 AM   Modules accepted: Orders

## 2024-06-27 NOTE — Telephone Encounter (Signed)
 Patient was previously prescribed plaquenil  200 mg 1 tablet by mouth daily. Ok to refill.

## 2024-06-27 NOTE — Telephone Encounter (Signed)
 Patient states she has consulted with her eye doctor and they have given the okay for her take PLQ. Patient will have them fax a letter giving clearance. Patient states she would like to start the PLQ as soon as possible. Patient states her hand is swelling and looks like a base ball glove.    Patient use [facility] in [place].

## 2024-07-01 ENCOUNTER — Ambulatory Visit: Admitting: Neurology

## 2024-07-01 DIAGNOSIS — G4700 Insomnia, unspecified: Secondary | ICD-10-CM

## 2024-07-01 DIAGNOSIS — R0681 Apnea, not elsewhere classified: Secondary | ICD-10-CM

## 2024-07-01 DIAGNOSIS — R0683 Snoring: Secondary | ICD-10-CM

## 2024-07-01 DIAGNOSIS — G4720 Circadian rhythm sleep disorder, unspecified type: Secondary | ICD-10-CM

## 2024-07-01 DIAGNOSIS — G4761 Periodic limb movement disorder: Secondary | ICD-10-CM

## 2024-07-01 DIAGNOSIS — Z9189 Other specified personal risk factors, not elsewhere classified: Secondary | ICD-10-CM

## 2024-07-01 DIAGNOSIS — G4719 Other hypersomnia: Secondary | ICD-10-CM

## 2024-07-02 NOTE — Telephone Encounter (Signed)
 Received a fax from [employer] regarding an approval for HUMIRA  patient assistance from 07/02/2024 to 06/20/2025. Approval letter sent to scan center.  Phone: [phone] Fax: [phone]

## 2024-07-02 NOTE — Procedures (Unsigned)
 Physician Interpretation: Please see link under Procedure Tab or under Encounters tab for physician report, technical report, as well as O2 titration and/or PAP titration tables (if applicable).

## 2024-07-03 NOTE — Progress Notes (Unsigned)
 "  Office Visit Note  [name]             Date of Birth: [birth year]           MRN: [id number]             PCP: Nichole Senior, MD Referring: Nichole Senior, MD Visit Date: 07/17/2024 Occupation: Data Unavailable  Subjective:  Right hand pain and swelling   History of Present Illness: Becky [name] is a 69 y.o. female with history of rheumatoid arthritis and iritis.  Patient remains on  Humira  40 mg sq injections every 14 days and plaquenil  200 mg 1 tablet by mouth daily.  Patient reports that she started Plaquenil  about 2 weeks ago.  She has noticed about a 90% improvement in her symptoms since initiating Plaquenil .  Patient states that prior to Plaquenil  use she was having recurrent flares affecting both hands, right more severe than left.  She had temporary relief while taking prednisone .  She has had better fist formation with the right hand and has had less inflammation.  She continues to have some soreness and swelling of the right third MCP joint.  She has occasional discomfort in both knee joints but denies any warmth or swelling. She denies any recurrent infections.  Activities of Daily Living:  Patient reports morning stiffness for all day.   Patient Denies nocturnal pain.  Difficulty dressing/grooming: Denies Difficulty climbing stairs: Reports Difficulty getting out of chair: Denies Difficulty using hands for taps, buttons, cutlery, and/or writing: Reports  Review of Systems  Constitutional:  Positive for fatigue.  HENT:  Positive for mouth dryness. Negative for mouth sores.   Eyes:  Positive for dryness.  Respiratory:  Negative for shortness of breath.   Cardiovascular:  Positive for chest pain. Negative for palpitations.  Gastrointestinal:  Negative for blood in stool, constipation and diarrhea.  Endocrine: Negative for increased urination.  Genitourinary:  Negative for involuntary urination.  Musculoskeletal:  Positive for joint pain, joint pain,  joint swelling, myalgias, morning stiffness and myalgias. Negative for gait problem, muscle weakness and muscle tenderness.  Skin:  Positive for hair loss. Negative for color change, rash and sensitivity to sunlight.  Allergic/Immunologic: Positive for susceptible to infections.  Neurological:  Negative for dizziness and headaches.  Hematological:  Negative for swollen glands.  Psychiatric/Behavioral:  Negative for depressed mood and sleep disturbance. The patient is not nervous/anxious.     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 determined by examination associated with type 1 diabetes mellitus (HCC) 03/18/2020   Stable treated proliferative diabetic retinopathy of right eye determined by examination associated with type 2 diabetes mellitus (HCC) 03/18/2020   History of vitrectomy 03/18/2020   Complicated UTI (urinary tract infection) 12/13/2018   Failure of outpatient treatment 12/11/2018   Acute metabolic encephalopathy 12/11/2018   Fever 12/10/2018   At high risk for breast cancer 05/09/2018   Eosinophilia 01/31/2017   Iritis 01/04/2017   History of joint swelling 08/24/2016   Osteoarthritis of knees, bilateral 08/24/2016   History of migraine 08/24/2016   History of depression 08/24/2016   History  of osteoporosis 08/24/2016   DDD (degenerative disc disease), lumbar 08/24/2016   High risk medication use 08/24/2016   Trigger finger, left middle finger 08/24/2016   Trigger finger, right ring finger 08/24/2016   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   Increased risk  of breast cancer 07/06/2015   IDDM (insulin  dependent diabetes mellitus) 07/06/2015   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 without organ or systems involvement (HCC) 07/06/2015   Sjogren's syndrome 07/06/2015   Genetic testing 05/12/2015   Family history of breast cancer 04/15/2015   Fibrocystic breast disease 10/11/2012   GERD 10/01/2009   Chest pain with moderate risk for cardiac etiology 09/02/2009   DYSPHAGIA 09/02/2009   Anxiety state 08/15/2009   Gastric ulcer 08/15/2009   Constipation 08/15/2009   SLOW TRANSIT CONSTIPATION 08/15/2009   FLATULENCE ERUCTATION AND GAS PAIN 08/15/2009    Past Medical History:  Diagnosis Date   Abnormal ECG    Allergy     Anemia    Anxiety    Cataract    . 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   Sjoegren syndrome     Family History  Problem Relation Age of Onset   Breast cancer Mother 68   Colon polyps Mother        unspecified number   Uterine cancer Mother        metastatic   Endometrial cancer Mother 46   Stroke Father 24   Dementia Father    Rheum arthritis Sister    Breast cancer Sister 48       lump; (-)GT (breast/ova ca panel, Gene[facility]x)   Breast cancer Maternal Grandmother 54   Skin cancer Maternal Grandfather        unspecified type   Prostate cancer Maternal Grandfather        dx. late 65s   Breast cancer Cousin        bilateral; dx. late 59s   Breast cancer Other        dx. in their late 60s   Breast  cancer Other        dx. 78-79   Colon cancer Neg Hx    Esophageal cancer Neg Hx    Rectal cancer Neg Hx    Stomach cancer Neg Hx    Past Surgical History:  Procedure Laterality Date   back ablation  03/2020   back ablation  10/09/2020   BREAST LUMPECTOMY  1995   CARPAL TUNNEL RELEASE  2000   CATARACT EXTRACTION Right 09-05-12   CATARACT EXTRACTION Left 3/15   CESAREAN SECTION  1987   COLONOSCOPY     EYE SURGERY     diabetic retinopathy    HYSTEROSCOPY  02/2001   and D & C   LYMPH NODE DISSECTION  2002   NASAL SINUS SURGERY Right 02/13/2024   Procedure: SINUS SURGERY, ENDOSCOPIC;  Surgeon: Jesus Oliphant, MD;  Location: [facility];  Service: ENT;  Laterality: Right;  Endoscopic sinus surgery with partial ethmoidectomy and maxillary antrostomy   TOE SURGERY  2000   bone removed   Social History[1] Social History   Social History Narrative   Caffiene 1 coffee daily, 1-2 diet cokes daily.   Live husband, 1 dog (sleeps in bed)   Work retired.       Immunization History  Administered Date(s) Administered   PFIZER(Purple Top)SARS-COV-2 Vaccination 07/11/2019, 08/01/2019, 02/05/2020, 09/15/2020   Tdap 06/21/2013     Objective: Vital Signs: BP (!) 155/76   Pulse 84   Temp 97.7 F (36.5 C)   Resp 14   Ht 5' 4 (1.626 m)   Wt 126 lb 3.2 oz (57.2 kg)   LMP 09/19/2008   BMI 21.66 kg/m    Physical Exam Vitals and nursing note reviewed.  Constitutional:      Appearance: She is well-developed.  HENT:     Head: Normocephalic and atraumatic.  Eyes:     Conjunctiva/sclera: Conjunctivae normal.  Cardiovascular:     Rate and Rhythm: Normal rate and regular rhythm.     Heart sounds: Normal heart sounds.  Pulmonary:     Effort: Pulmonary effort is normal.     Breath sounds: Normal breath sounds.  Abdominal:     General: Bowel sounds are normal.     Palpations: Abdomen is soft.  Musculoskeletal:     Cervical back: Normal range of motion.  Lymphadenopathy:      Cervical: No cervical adenopathy.  Skin:    General: Skin is warm and dry.     Capillary Refill: Capillary refill takes less than 2 seconds.  Neurological:     Mental Status: She is alert and oriented to person, place, and time.  Psychiatric:        Behavior: Behavior normal.      Musculoskeletal Exam: C-spine, thoracic spine, lumbar spine have good range of motion.  No midline spinal tenderness.  No SI joint tenderness.  Shoulder joints, elbow joints, wrist joints, MCPs, PIPs, DIPs have good range of motion with no synovitis.  Tenderness and synovial thickening of the right third MCP joint.  Synovial thickening and mild tenderness of both first MCP joints.  Complete fist formation bilaterally.  Hip joints have good range of motion with no groin pain.  Knee joints have good range of motion no warmth or effusion.  Ankle joints have good range of motion no tenderness or joint swelling.  No evidence of Achilles tendinitis or plantar fasciitis.    CDAI Exam: CDAI Score: -- Patient Global: --; Provider Global: -- Swollen: --; Tender: -- Joint Exam 07/17/2024   No joint exam has been documented for this visit   There is currently no information documented on the homunculus. Go to the Rheumatology activity and complete the homunculus joint exam.  Investigation: No additional findings.  Imaging: Nocturnal polysomnography Result Date: 07/01/2024 Buck Saucer, MD     07/05/2024  4:02 PM Physician Interpretation: Please see link under Procedure Tab or under Encounters tab for physician report, technical report, as well as O2 titration and/or PAP titration tables (if applicable).    Recent Labs: Lab Results  Component Value Date   WBC 6.9 06/07/2024   HGB 14.1 06/07/2024   PLT 277 06/07/2024   NA 141 06/07/2024   K 4.5 06/07/2024   CL 106 06/07/2024   CO2 28 06/07/2024   GLUCOSE 151 (H) 06/07/2024   BUN 15 06/07/2024   CREATININE 0.96 06/07/2024  BILITOT 0.3 06/07/2024    ALKPHOS 79 05/13/2020   AST 14 06/07/2024   ALT 9 06/07/2024   PROT 6.7 06/07/2024   ALBUMIN 3.9 05/13/2020   CALCIUM 9.8 06/07/2024   GFRAA 66 12/17/2020   QFTBGOLD NEGATIVE 04/06/2017   QFTBGOLDPLUS NEGATIVE 06/07/2024    Speciality Comments: PLQ Eye Exam: 10/13/2023 WNL @ [facility] Follow up in 6 months.  Receives Enbrel  [employer] through [employer]. 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  She is currently on Humira  40 mg sq injections once every 14 days and is taking Plaquenil  200 mg 1 tablet by mouth daily.  She is tolerating Plaquenil  without any side effects.  She initiated Plaquenil  about 2 weeks ago and has noticed a 90% improvement in her symptoms.  She was having recurrent flares affecting both hands, right more severe than left.  She is willing to give the combination more time prior to making any other changes.  Discussed that if she continues to have persistent pain or recurrent inflammation in the right third MCP joint we could consider scheduling an ultrasound-guided right third MCP cortisone injection in the future.   No medication changes will be made at this time.  She will follow up in 5 months or sooner if needed.   High risk medication use - Humira  40 mg sq injections every 14 days. Humira  initiated on 08/22/2023.  Plaquenil  200 mg 1 tablet by mouth daily.--Started med January 2026. Inadequate response and loss of patient assistance for Enbrel . CBC and CMP updated 06/07/24.  TB gold negative 06/07/24.   Discussed the importance of holding humira  if she develops signs or symptoms of an infection and to resume once the infection has completely cleared.   PLQ Eye Exam: 10/13/2023 WNL @ [facility] Follow up in 6 months.  Iritis - No active inflammation noted on exam from 05/04/2023.  No recent flares.  Sjogren's syndrome with keratoconjunctivitis sicca: ANA has remained positive: She continues to have chronic sicca symptoms-stable.  SPEP normal on 01/25/2024.  Complements within normal limits on 01/25/2024.  Trigger thumb, left thumb: Not currently locking.  Primary osteoarthritis of both knees: She underwent viscosupplementation for both knees in April 2025.  Mild crepitus with range of motion.  No warmth or effusion noted.  DDD (degenerative disc disease), cervical - Referred to PT-completed on 03/16/23.  No symptoms of radiculopathy.  Degeneration of intervertebral disc of lumbar region without discogenic back pain or lower extremity pain: No symptoms of radiculopathy at this time.  Age-related osteoporosis without current pathological fracture - DEXA ordered by Dr. Nichole.  Prolia  injections x3. last injection 07/27/21.  Discontinued Prolia -SE. She is taking calcium carbonate 600 mg twice daily.  Other medical conditions are listed as follows:  Grover's disease  History of migraine  Other fatigue  History of gastroesophageal reflux (GERD)  History of diabetes mellitus  History of depression  Orders: No orders of the defined types were placed in this encounter.  No orders of the defined types were placed in this encounter.   Follow-Up Instructions: Return in about 5 months (around 12/15/2024)  for Rheumatoid arthritis.   Waddell CHRISTELLA Craze, PA-C  Note - This record has been created using Dragon software.  Chart creation errors have been sought, but may not always  have been located. Such creation errors do not reflect on  the standard of medical care.     [1]   Social History Tobacco Use   Smoking status: Never    Passive exposure: Past   Smokeless tobacco: Never  Vaping Use   Vaping status: Never Used  Substance Use Topics   Alcohol  use: Yes    Alcohol /week: 2.0 standard drinks of alcohol     Types: 1 Shots of liquor, 1 Standard drinks or equivalent per week    Comment: maybe 1-2 cocktails per wk   Drug use: Not Currently   "

## 2024-07-05 ENCOUNTER — Ambulatory Visit: Payer: Self-pay | Admitting: Neurology

## 2024-07-17 ENCOUNTER — Ambulatory Visit: Admitting: Physician Assistant

## 2024-07-17 ENCOUNTER — Encounter: Payer: Self-pay | Admitting: Physician Assistant

## 2024-07-17 VITALS — BP 155/76 | HR 84 | Temp 97.7°F | Resp 14 | Ht 64.0 in | Wt 126.2 lb

## 2024-07-17 DIAGNOSIS — Z79899 Other long term (current) drug therapy: Secondary | ICD-10-CM

## 2024-07-17 DIAGNOSIS — M3501 Sicca syndrome with keratoconjunctivitis: Secondary | ICD-10-CM

## 2024-07-17 DIAGNOSIS — M65312 Trigger thumb, left thumb: Secondary | ICD-10-CM | POA: Diagnosis not present

## 2024-07-17 DIAGNOSIS — M51369 Other intervertebral disc degeneration, lumbar region without mention of lumbar back pain or lower extremity pain: Secondary | ICD-10-CM | POA: Diagnosis not present

## 2024-07-17 DIAGNOSIS — M810 Age-related osteoporosis without current pathological fracture: Secondary | ICD-10-CM

## 2024-07-17 DIAGNOSIS — Z8669 Personal history of other diseases of the nervous system and sense organs: Secondary | ICD-10-CM

## 2024-07-17 DIAGNOSIS — H209 Unspecified iridocyclitis: Secondary | ICD-10-CM

## 2024-07-17 DIAGNOSIS — M0579 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 without organ or systems involvement: Secondary | ICD-10-CM

## 2024-07-17 DIAGNOSIS — M170 Bilateral primary osteoarthritis of knee: Secondary | ICD-10-CM | POA: Diagnosis not present

## 2024-07-17 DIAGNOSIS — L111 Transient acantholytic dermatosis [Grover]: Secondary | ICD-10-CM | POA: Diagnosis not present

## 2024-07-17 DIAGNOSIS — R5383 Other fatigue: Secondary | ICD-10-CM

## 2024-07-17 DIAGNOSIS — M5030 Other cervical disc degeneration, unspecified cervical region: Secondary | ICD-10-CM

## 2024-07-17 DIAGNOSIS — Z8639 Personal history of other endocrine, nutritional and metabolic disease: Secondary | ICD-10-CM

## 2024-07-17 DIAGNOSIS — Z8659 Personal history of other mental and behavioral disorders: Secondary | ICD-10-CM

## 2024-07-17 DIAGNOSIS — Z8719 Personal history of other diseases of the digestive system: Secondary | ICD-10-CM

## 2024-09-04 ENCOUNTER — Encounter: Admitting: Obstetrics and Gynecology

## 2024-12-18 ENCOUNTER — Ambulatory Visit: Admitting: Rheumatology
